# Patient Record
Sex: Female | Born: 1950
Health system: Southern US, Community
[De-identification: ages and names within clinical notes are randomized; demographics above are authoritative.]

## PROBLEM LIST (undated history)

## (undated) DIAGNOSIS — I2699 Other pulmonary embolism without acute cor pulmonale: Secondary | ICD-10-CM

## (undated) DIAGNOSIS — D72819 Decreased white blood cell count, unspecified: Secondary | ICD-10-CM

## (undated) DIAGNOSIS — K219 Gastro-esophageal reflux disease without esophagitis: Secondary | ICD-10-CM

## (undated) DIAGNOSIS — C801 Malignant (primary) neoplasm, unspecified: Secondary | ICD-10-CM

## (undated) DIAGNOSIS — G8929 Other chronic pain: Secondary | ICD-10-CM

## (undated) DIAGNOSIS — I10 Essential (primary) hypertension: Secondary | ICD-10-CM

## (undated) DIAGNOSIS — E46 Unspecified protein-calorie malnutrition: Secondary | ICD-10-CM

## (undated) DIAGNOSIS — K760 Fatty (change of) liver, not elsewhere classified: Secondary | ICD-10-CM

## (undated) DIAGNOSIS — K648 Other hemorrhoids: Secondary | ICD-10-CM

## (undated) DIAGNOSIS — I82409 Acute embolism and thrombosis of unspecified deep veins of unspecified lower extremity: Secondary | ICD-10-CM

## (undated) DIAGNOSIS — E039 Hypothyroidism, unspecified: Secondary | ICD-10-CM

## (undated) DIAGNOSIS — K56609 Unspecified intestinal obstruction, unspecified as to partial versus complete obstruction: Secondary | ICD-10-CM

## (undated) DIAGNOSIS — N390 Urinary tract infection, site not specified: Secondary | ICD-10-CM

## (undated) DIAGNOSIS — K626 Ulcer of anus and rectum: Secondary | ICD-10-CM

## (undated) DIAGNOSIS — E785 Hyperlipidemia, unspecified: Secondary | ICD-10-CM

## (undated) DIAGNOSIS — M549 Dorsalgia, unspecified: Secondary | ICD-10-CM

## (undated) DIAGNOSIS — D689 Coagulation defect, unspecified: Secondary | ICD-10-CM

## (undated) DIAGNOSIS — M109 Gout, unspecified: Secondary | ICD-10-CM

## (undated) HISTORY — PX: ABDOMINAL SURGERY: SHX537

## (undated) HISTORY — DX: Ulcer of anus and rectum: K62.6

## (undated) HISTORY — DX: Other chronic pain: G89.29

## (undated) HISTORY — DX: Dorsalgia, unspecified: M54.9

## (undated) HISTORY — PX: BLADDER REMOVAL: SHX567

## (undated) HISTORY — PX: BACK SURGERY: SHX140

## (undated) HISTORY — DX: Essential (primary) hypertension: I10

## (undated) HISTORY — PX: HERNIA REPAIR: SHX51

## (undated) HISTORY — DX: Acute embolism and thrombosis of unspecified deep veins of unspecified lower extremity: I82.409

## (undated) HISTORY — PX: CYSTECTOMY: SUR359

## (undated) HISTORY — DX: Gout, unspecified: M10.9

## (undated) HISTORY — DX: Fatty (change of) liver, not elsewhere classified: K76.0

## (undated) HISTORY — PX: PORTACATH PLACEMENT: SHX2246

## (undated) HISTORY — DX: Other hemorrhoids: K64.8

## (undated) HISTORY — DX: Coagulation defect, unspecified: D68.9

## (undated) HISTORY — DX: Other pulmonary embolism without acute cor pulmonale: I26.99

## (undated) HISTORY — PX: ILEO CONDUIT: SHX1778

## (undated) HISTORY — PX: CHOLECYSTECTOMY: SHX55

## (undated) HISTORY — DX: Hyperlipidemia, unspecified: E78.5

---

## 2001-07-21 ENCOUNTER — Ambulatory Visit (HOSPITAL_COMMUNITY): Admission: RE | Admit: 2001-07-21 | Discharge: 2001-07-21 | Payer: Self-pay | Admitting: Internal Medicine

## 2001-07-21 ENCOUNTER — Encounter: Payer: Self-pay | Admitting: Internal Medicine

## 2001-09-01 ENCOUNTER — Other Ambulatory Visit: Admission: RE | Admit: 2001-09-01 | Discharge: 2001-09-01 | Payer: Self-pay | Admitting: Internal Medicine

## 2001-12-09 ENCOUNTER — Emergency Department (HOSPITAL_COMMUNITY): Admission: EM | Admit: 2001-12-09 | Discharge: 2001-12-09 | Payer: Self-pay | Admitting: Internal Medicine

## 2001-12-09 ENCOUNTER — Encounter: Payer: Self-pay | Admitting: Internal Medicine

## 2002-09-08 ENCOUNTER — Ambulatory Visit (HOSPITAL_COMMUNITY): Admission: RE | Admit: 2002-09-08 | Discharge: 2002-09-08 | Payer: Self-pay | Admitting: General Surgery

## 2002-10-15 ENCOUNTER — Ambulatory Visit (HOSPITAL_COMMUNITY): Admission: RE | Admit: 2002-10-15 | Discharge: 2002-10-15 | Payer: Self-pay | Admitting: Internal Medicine

## 2002-10-15 ENCOUNTER — Encounter: Payer: Self-pay | Admitting: Internal Medicine

## 2003-07-13 ENCOUNTER — Ambulatory Visit (HOSPITAL_COMMUNITY): Admission: RE | Admit: 2003-07-13 | Discharge: 2003-07-13 | Payer: Self-pay | Admitting: General Surgery

## 2003-08-30 ENCOUNTER — Ambulatory Visit (HOSPITAL_COMMUNITY): Admission: RE | Admit: 2003-08-30 | Discharge: 2003-08-30 | Payer: Self-pay | Admitting: General Surgery

## 2004-01-18 ENCOUNTER — Ambulatory Visit (HOSPITAL_COMMUNITY): Admission: RE | Admit: 2004-01-18 | Discharge: 2004-01-18 | Payer: Self-pay | Admitting: Family Medicine

## 2004-06-08 ENCOUNTER — Emergency Department (HOSPITAL_COMMUNITY): Admission: EM | Admit: 2004-06-08 | Discharge: 2004-06-08 | Payer: Self-pay | Admitting: Emergency Medicine

## 2004-06-15 ENCOUNTER — Emergency Department (HOSPITAL_COMMUNITY): Admission: EM | Admit: 2004-06-15 | Discharge: 2004-06-15 | Payer: Self-pay | Admitting: Emergency Medicine

## 2004-07-30 ENCOUNTER — Ambulatory Visit: Payer: Self-pay | Admitting: *Deleted

## 2004-07-30 ENCOUNTER — Ambulatory Visit (HOSPITAL_COMMUNITY): Admission: RE | Admit: 2004-07-30 | Discharge: 2004-07-30 | Payer: Self-pay | Admitting: General Surgery

## 2004-08-02 ENCOUNTER — Encounter (HOSPITAL_COMMUNITY): Admission: RE | Admit: 2004-08-02 | Discharge: 2004-08-03 | Payer: Self-pay | Admitting: Internal Medicine

## 2004-12-11 ENCOUNTER — Emergency Department (HOSPITAL_COMMUNITY): Admission: EM | Admit: 2004-12-11 | Discharge: 2004-12-11 | Payer: Self-pay | Admitting: Emergency Medicine

## 2004-12-20 ENCOUNTER — Ambulatory Visit (HOSPITAL_COMMUNITY): Admission: RE | Admit: 2004-12-20 | Discharge: 2004-12-20 | Payer: Self-pay | Admitting: General Surgery

## 2005-11-24 ENCOUNTER — Emergency Department (HOSPITAL_COMMUNITY): Admission: EM | Admit: 2005-11-24 | Discharge: 2005-11-24 | Payer: Self-pay | Admitting: Emergency Medicine

## 2005-11-28 ENCOUNTER — Ambulatory Visit (HOSPITAL_COMMUNITY): Admission: RE | Admit: 2005-11-28 | Discharge: 2005-11-28 | Payer: Self-pay | Admitting: General Surgery

## 2005-12-03 ENCOUNTER — Encounter (INDEPENDENT_AMBULATORY_CARE_PROVIDER_SITE_OTHER): Payer: Self-pay | Admitting: Specialist

## 2005-12-03 ENCOUNTER — Inpatient Hospital Stay (HOSPITAL_COMMUNITY): Admission: RE | Admit: 2005-12-03 | Discharge: 2005-12-13 | Payer: Self-pay | Admitting: General Surgery

## 2006-01-29 ENCOUNTER — Encounter (INDEPENDENT_AMBULATORY_CARE_PROVIDER_SITE_OTHER): Payer: Self-pay | Admitting: Internal Medicine

## 2006-01-29 LAB — CONVERTED CEMR LAB: Blood Glucose, Fasting: 111 mg/dL

## 2006-02-12 ENCOUNTER — Ambulatory Visit: Payer: Self-pay | Admitting: Internal Medicine

## 2006-02-14 ENCOUNTER — Encounter (INDEPENDENT_AMBULATORY_CARE_PROVIDER_SITE_OTHER): Payer: Self-pay | Admitting: Internal Medicine

## 2006-02-14 LAB — CONVERTED CEMR LAB: RBC count: 4.1 10*6/uL

## 2006-02-18 ENCOUNTER — Ambulatory Visit (HOSPITAL_COMMUNITY): Admission: RE | Admit: 2006-02-18 | Discharge: 2006-02-18 | Payer: Self-pay | Admitting: Internal Medicine

## 2006-02-26 ENCOUNTER — Ambulatory Visit: Payer: Self-pay | Admitting: Gastroenterology

## 2006-04-09 ENCOUNTER — Emergency Department (HOSPITAL_COMMUNITY): Admission: EM | Admit: 2006-04-09 | Discharge: 2006-04-09 | Payer: Self-pay | Admitting: Emergency Medicine

## 2006-05-13 ENCOUNTER — Emergency Department (HOSPITAL_COMMUNITY): Admission: EM | Admit: 2006-05-13 | Discharge: 2006-05-13 | Payer: Self-pay | Admitting: Emergency Medicine

## 2006-07-16 ENCOUNTER — Encounter (INDEPENDENT_AMBULATORY_CARE_PROVIDER_SITE_OTHER): Payer: Self-pay | Admitting: Internal Medicine

## 2006-07-16 ENCOUNTER — Ambulatory Visit: Payer: Self-pay | Admitting: Internal Medicine

## 2006-07-16 ENCOUNTER — Other Ambulatory Visit: Admission: RE | Admit: 2006-07-16 | Discharge: 2006-07-16 | Payer: Self-pay | Admitting: Internal Medicine

## 2006-07-16 LAB — CONVERTED CEMR LAB
Pap Smear: NORMAL
RBC / HPF: NONE SEEN (ref <3)
Urinalysis: NEGATIVE

## 2006-07-17 ENCOUNTER — Encounter (INDEPENDENT_AMBULATORY_CARE_PROVIDER_SITE_OTHER): Payer: Self-pay | Admitting: Internal Medicine

## 2006-07-17 LAB — CONVERTED CEMR LAB
Candida species: NEGATIVE
Trichomonal Vaginitis: NEGATIVE

## 2006-07-21 ENCOUNTER — Encounter: Payer: Self-pay | Admitting: Internal Medicine

## 2006-07-21 DIAGNOSIS — M545 Low back pain, unspecified: Secondary | ICD-10-CM | POA: Insufficient documentation

## 2006-07-21 DIAGNOSIS — K219 Gastro-esophageal reflux disease without esophagitis: Secondary | ICD-10-CM | POA: Insufficient documentation

## 2006-07-21 DIAGNOSIS — M129 Arthropathy, unspecified: Secondary | ICD-10-CM | POA: Insufficient documentation

## 2006-07-21 DIAGNOSIS — Z8639 Personal history of other endocrine, nutritional and metabolic disease: Secondary | ICD-10-CM

## 2006-07-21 DIAGNOSIS — N318 Other neuromuscular dysfunction of bladder: Secondary | ICD-10-CM | POA: Insufficient documentation

## 2006-07-21 DIAGNOSIS — L03319 Cellulitis of trunk, unspecified: Secondary | ICD-10-CM

## 2006-07-21 DIAGNOSIS — I10 Essential (primary) hypertension: Secondary | ICD-10-CM | POA: Insufficient documentation

## 2006-07-21 DIAGNOSIS — E039 Hypothyroidism, unspecified: Secondary | ICD-10-CM | POA: Insufficient documentation

## 2006-07-21 DIAGNOSIS — K259 Gastric ulcer, unspecified as acute or chronic, without hemorrhage or perforation: Secondary | ICD-10-CM | POA: Insufficient documentation

## 2006-07-21 DIAGNOSIS — Z862 Personal history of diseases of the blood and blood-forming organs and certain disorders involving the immune mechanism: Secondary | ICD-10-CM | POA: Insufficient documentation

## 2006-07-21 DIAGNOSIS — L02219 Cutaneous abscess of trunk, unspecified: Secondary | ICD-10-CM | POA: Insufficient documentation

## 2006-07-23 ENCOUNTER — Encounter (INDEPENDENT_AMBULATORY_CARE_PROVIDER_SITE_OTHER): Payer: Self-pay | Admitting: Internal Medicine

## 2006-07-23 LAB — CONVERTED CEMR LAB
CO2: 24 meq/L (ref 19–32)
Chloride: 105 meq/L (ref 96–112)
Creatinine, Ser: 0.68 mg/dL (ref 0.40–1.20)
Free T4: 1.54 ng/dL (ref 0.89–1.80)
Glucose, Bld: 107 mg/dL — ABNORMAL HIGH (ref 70–99)
Triglycerides: 98 mg/dL (ref <150)

## 2006-08-22 ENCOUNTER — Ambulatory Visit: Payer: Self-pay | Admitting: Internal Medicine

## 2006-08-22 DIAGNOSIS — F17200 Nicotine dependence, unspecified, uncomplicated: Secondary | ICD-10-CM | POA: Insufficient documentation

## 2006-09-17 ENCOUNTER — Emergency Department (HOSPITAL_COMMUNITY): Admission: EM | Admit: 2006-09-17 | Discharge: 2006-09-17 | Payer: Self-pay | Admitting: Emergency Medicine

## 2006-10-16 ENCOUNTER — Ambulatory Visit: Payer: Self-pay | Admitting: Internal Medicine

## 2006-10-16 DIAGNOSIS — M79609 Pain in unspecified limb: Secondary | ICD-10-CM | POA: Insufficient documentation

## 2006-10-24 ENCOUNTER — Encounter (INDEPENDENT_AMBULATORY_CARE_PROVIDER_SITE_OTHER): Payer: Self-pay | Admitting: Internal Medicine

## 2006-12-08 ENCOUNTER — Telehealth (INDEPENDENT_AMBULATORY_CARE_PROVIDER_SITE_OTHER): Payer: Self-pay | Admitting: *Deleted

## 2006-12-09 ENCOUNTER — Ambulatory Visit: Payer: Self-pay | Admitting: Internal Medicine

## 2006-12-09 DIAGNOSIS — R0602 Shortness of breath: Secondary | ICD-10-CM | POA: Insufficient documentation

## 2006-12-10 ENCOUNTER — Encounter (INDEPENDENT_AMBULATORY_CARE_PROVIDER_SITE_OTHER): Payer: Self-pay | Admitting: Internal Medicine

## 2006-12-25 ENCOUNTER — Ambulatory Visit: Payer: Self-pay | Admitting: Internal Medicine

## 2006-12-25 LAB — CONVERTED CEMR LAB
Bilirubin Urine: NEGATIVE
Nitrite: NEGATIVE
Protein, U semiquant: 100
Specific Gravity, Urine: >=1.03
pH: 5.5

## 2006-12-30 ENCOUNTER — Telehealth (INDEPENDENT_AMBULATORY_CARE_PROVIDER_SITE_OTHER): Payer: Self-pay | Admitting: Internal Medicine

## 2006-12-31 ENCOUNTER — Ambulatory Visit: Payer: Self-pay | Admitting: Internal Medicine

## 2006-12-31 LAB — CONVERTED CEMR LAB
Bilirubin Urine: NEGATIVE
Glucose, Urine, Semiquant: NEGATIVE
Ketones, urine, test strip: NEGATIVE
Nitrite: POSITIVE
Protein, U semiquant: 100
Specific Gravity, Urine: >=1.03

## 2007-01-01 ENCOUNTER — Encounter (INDEPENDENT_AMBULATORY_CARE_PROVIDER_SITE_OTHER): Payer: Self-pay | Admitting: Internal Medicine

## 2007-01-08 ENCOUNTER — Ambulatory Visit: Payer: Self-pay | Admitting: Internal Medicine

## 2007-01-08 DIAGNOSIS — R319 Hematuria, unspecified: Secondary | ICD-10-CM | POA: Insufficient documentation

## 2007-01-08 DIAGNOSIS — R351 Nocturia: Secondary | ICD-10-CM | POA: Insufficient documentation

## 2007-01-08 LAB — CONVERTED CEMR LAB
Bilirubin Urine: NEGATIVE
Glucose, Urine, Semiquant: NEGATIVE
Ketones, urine, test strip: NEGATIVE
Nitrite: NEGATIVE
Protein, U semiquant: NEGATIVE
Urobilinogen, UA: 0.2

## 2007-01-09 LAB — CONVERTED CEMR LAB

## 2007-01-14 ENCOUNTER — Ambulatory Visit: Payer: Self-pay | Admitting: Cardiology

## 2007-01-15 ENCOUNTER — Inpatient Hospital Stay (HOSPITAL_COMMUNITY): Admission: AD | Admit: 2007-01-15 | Discharge: 2007-01-16 | Payer: Self-pay | Admitting: Cardiology

## 2007-01-15 ENCOUNTER — Ambulatory Visit: Payer: Self-pay | Admitting: Cardiology

## 2007-01-22 ENCOUNTER — Ambulatory Visit: Payer: Self-pay | Admitting: Cardiology

## 2007-01-23 ENCOUNTER — Ambulatory Visit: Payer: Self-pay | Admitting: Internal Medicine

## 2007-01-23 DIAGNOSIS — E785 Hyperlipidemia, unspecified: Secondary | ICD-10-CM | POA: Insufficient documentation

## 2007-01-26 ENCOUNTER — Telehealth (INDEPENDENT_AMBULATORY_CARE_PROVIDER_SITE_OTHER): Payer: Self-pay | Admitting: *Deleted

## 2007-01-26 LAB — CONVERTED CEMR LAB: Free T4: 1.36 ng/dL (ref 0.89–1.80)

## 2007-03-20 ENCOUNTER — Ambulatory Visit: Payer: Self-pay | Admitting: Internal Medicine

## 2007-03-25 ENCOUNTER — Telehealth (INDEPENDENT_AMBULATORY_CARE_PROVIDER_SITE_OTHER): Payer: Self-pay | Admitting: *Deleted

## 2007-05-22 ENCOUNTER — Ambulatory Visit: Payer: Self-pay | Admitting: Internal Medicine

## 2007-05-22 DIAGNOSIS — R3589 Other polyuria: Secondary | ICD-10-CM | POA: Insufficient documentation

## 2007-05-22 DIAGNOSIS — R358 Other polyuria: Secondary | ICD-10-CM

## 2007-05-22 LAB — CONVERTED CEMR LAB
Ketones, urine, test strip: NEGATIVE
Protein, U semiquant: NEGATIVE

## 2007-05-25 ENCOUNTER — Telehealth (INDEPENDENT_AMBULATORY_CARE_PROVIDER_SITE_OTHER): Payer: Self-pay | Admitting: *Deleted

## 2007-06-02 ENCOUNTER — Ambulatory Visit (HOSPITAL_COMMUNITY): Admission: RE | Admit: 2007-06-02 | Discharge: 2007-06-02 | Payer: Self-pay | Admitting: Internal Medicine

## 2007-06-09 ENCOUNTER — Telehealth (INDEPENDENT_AMBULATORY_CARE_PROVIDER_SITE_OTHER): Payer: Self-pay | Admitting: *Deleted

## 2007-07-01 ENCOUNTER — Ambulatory Visit: Payer: Self-pay | Admitting: Internal Medicine

## 2007-07-01 DIAGNOSIS — J209 Acute bronchitis, unspecified: Secondary | ICD-10-CM | POA: Insufficient documentation

## 2007-07-15 ENCOUNTER — Ambulatory Visit: Payer: Self-pay | Admitting: Internal Medicine

## 2007-07-15 ENCOUNTER — Telehealth (INDEPENDENT_AMBULATORY_CARE_PROVIDER_SITE_OTHER): Payer: Self-pay | Admitting: *Deleted

## 2007-07-15 DIAGNOSIS — K838 Other specified diseases of biliary tract: Secondary | ICD-10-CM | POA: Insufficient documentation

## 2007-07-16 ENCOUNTER — Encounter (INDEPENDENT_AMBULATORY_CARE_PROVIDER_SITE_OTHER): Payer: Self-pay | Admitting: Internal Medicine

## 2007-07-16 LAB — CONVERTED CEMR LAB
AST: 7 units/L (ref 0–37)
Cholesterol: 242 mg/dL — ABNORMAL HIGH (ref 0–200)
Glucose, Bld: 128 mg/dL — ABNORMAL HIGH (ref 70–99)
HDL: 46 mg/dL (ref >39)
Total CHOL/HDL Ratio: 5.3
VLDL: 21 mg/dL (ref 0–40)

## 2007-07-21 ENCOUNTER — Encounter (INDEPENDENT_AMBULATORY_CARE_PROVIDER_SITE_OTHER): Payer: Self-pay | Admitting: Internal Medicine

## 2007-07-23 ENCOUNTER — Encounter (INDEPENDENT_AMBULATORY_CARE_PROVIDER_SITE_OTHER): Payer: Self-pay | Admitting: Internal Medicine

## 2007-07-25 ENCOUNTER — Telehealth (INDEPENDENT_AMBULATORY_CARE_PROVIDER_SITE_OTHER): Payer: Self-pay | Admitting: Internal Medicine

## 2007-08-17 ENCOUNTER — Ambulatory Visit: Payer: Self-pay | Admitting: Internal Medicine

## 2007-08-18 ENCOUNTER — Encounter (INDEPENDENT_AMBULATORY_CARE_PROVIDER_SITE_OTHER): Payer: Self-pay | Admitting: Internal Medicine

## 2007-08-20 ENCOUNTER — Telehealth (INDEPENDENT_AMBULATORY_CARE_PROVIDER_SITE_OTHER): Payer: Self-pay | Admitting: Internal Medicine

## 2007-08-21 ENCOUNTER — Ambulatory Visit: Payer: Self-pay | Admitting: Internal Medicine

## 2007-08-21 DIAGNOSIS — R5383 Other fatigue: Secondary | ICD-10-CM | POA: Insufficient documentation

## 2007-08-21 DIAGNOSIS — R5381 Other malaise: Secondary | ICD-10-CM | POA: Insufficient documentation

## 2007-08-21 DIAGNOSIS — F438 Other reactions to severe stress: Secondary | ICD-10-CM

## 2007-08-21 DIAGNOSIS — F4389 Other reactions to severe stress: Secondary | ICD-10-CM | POA: Insufficient documentation

## 2007-08-24 ENCOUNTER — Encounter (INDEPENDENT_AMBULATORY_CARE_PROVIDER_SITE_OTHER): Payer: Self-pay | Admitting: Internal Medicine

## 2007-08-24 ENCOUNTER — Ambulatory Visit (HOSPITAL_BASED_OUTPATIENT_CLINIC_OR_DEPARTMENT_OTHER): Admission: RE | Admit: 2007-08-24 | Discharge: 2007-08-24 | Payer: Self-pay | Admitting: Urology

## 2007-08-24 ENCOUNTER — Encounter (INDEPENDENT_AMBULATORY_CARE_PROVIDER_SITE_OTHER): Payer: Self-pay | Admitting: Urology

## 2007-09-02 ENCOUNTER — Encounter (INDEPENDENT_AMBULATORY_CARE_PROVIDER_SITE_OTHER): Payer: Self-pay | Admitting: Internal Medicine

## 2007-10-02 ENCOUNTER — Encounter (INDEPENDENT_AMBULATORY_CARE_PROVIDER_SITE_OTHER): Payer: Self-pay | Admitting: Internal Medicine

## 2007-11-12 ENCOUNTER — Encounter (INDEPENDENT_AMBULATORY_CARE_PROVIDER_SITE_OTHER): Payer: Self-pay | Admitting: Internal Medicine

## 2007-11-17 ENCOUNTER — Emergency Department (HOSPITAL_COMMUNITY): Admission: EM | Admit: 2007-11-17 | Discharge: 2007-11-17 | Payer: Self-pay | Admitting: Emergency Medicine

## 2007-11-17 ENCOUNTER — Telehealth (INDEPENDENT_AMBULATORY_CARE_PROVIDER_SITE_OTHER): Payer: Self-pay | Admitting: Internal Medicine

## 2007-11-25 ENCOUNTER — Ambulatory Visit (HOSPITAL_BASED_OUTPATIENT_CLINIC_OR_DEPARTMENT_OTHER): Admission: RE | Admit: 2007-11-25 | Discharge: 2007-11-25 | Payer: Self-pay | Admitting: Urology

## 2007-11-25 ENCOUNTER — Encounter (INDEPENDENT_AMBULATORY_CARE_PROVIDER_SITE_OTHER): Payer: Self-pay | Admitting: Urology

## 2007-12-04 ENCOUNTER — Encounter (INDEPENDENT_AMBULATORY_CARE_PROVIDER_SITE_OTHER): Payer: Self-pay | Admitting: Internal Medicine

## 2007-12-14 ENCOUNTER — Encounter (INDEPENDENT_AMBULATORY_CARE_PROVIDER_SITE_OTHER): Payer: Self-pay | Admitting: Urology

## 2007-12-14 ENCOUNTER — Ambulatory Visit (HOSPITAL_BASED_OUTPATIENT_CLINIC_OR_DEPARTMENT_OTHER): Admission: RE | Admit: 2007-12-14 | Discharge: 2007-12-14 | Payer: Self-pay | Admitting: Urology

## 2007-12-24 ENCOUNTER — Encounter (INDEPENDENT_AMBULATORY_CARE_PROVIDER_SITE_OTHER): Payer: Self-pay | Admitting: Internal Medicine

## 2008-04-20 ENCOUNTER — Encounter (INDEPENDENT_AMBULATORY_CARE_PROVIDER_SITE_OTHER): Payer: Self-pay | Admitting: Urology

## 2008-04-20 ENCOUNTER — Ambulatory Visit (HOSPITAL_BASED_OUTPATIENT_CLINIC_OR_DEPARTMENT_OTHER): Admission: RE | Admit: 2008-04-20 | Discharge: 2008-04-20 | Payer: Self-pay | Admitting: Urology

## 2008-05-09 ENCOUNTER — Emergency Department (HOSPITAL_COMMUNITY): Admission: EM | Admit: 2008-05-09 | Discharge: 2008-05-10 | Payer: Self-pay | Admitting: Emergency Medicine

## 2008-09-19 ENCOUNTER — Inpatient Hospital Stay: Admission: RE | Admit: 2008-09-19 | Discharge: 2008-10-05 | Payer: Self-pay | Admitting: Internal Medicine

## 2008-10-16 ENCOUNTER — Emergency Department (HOSPITAL_COMMUNITY): Admission: EM | Admit: 2008-10-16 | Discharge: 2008-10-16 | Payer: Self-pay | Admitting: Emergency Medicine

## 2008-11-17 ENCOUNTER — Emergency Department (HOSPITAL_COMMUNITY): Admission: EM | Admit: 2008-11-17 | Discharge: 2008-11-17 | Payer: Self-pay | Admitting: Emergency Medicine

## 2008-11-21 ENCOUNTER — Emergency Department (HOSPITAL_COMMUNITY): Admission: EM | Admit: 2008-11-21 | Discharge: 2008-11-21 | Payer: Self-pay | Admitting: Emergency Medicine

## 2009-05-07 ENCOUNTER — Emergency Department (HOSPITAL_COMMUNITY): Admission: EM | Admit: 2009-05-07 | Discharge: 2009-05-07 | Payer: Self-pay | Admitting: Emergency Medicine

## 2009-06-03 ENCOUNTER — Emergency Department (HOSPITAL_COMMUNITY): Admission: EM | Admit: 2009-06-03 | Discharge: 2009-06-03 | Payer: Self-pay | Admitting: Emergency Medicine

## 2009-08-16 ENCOUNTER — Emergency Department (HOSPITAL_COMMUNITY): Admission: EM | Admit: 2009-08-16 | Discharge: 2009-08-16 | Payer: Self-pay | Admitting: Emergency Medicine

## 2009-09-28 ENCOUNTER — Emergency Department (HOSPITAL_COMMUNITY): Admission: EM | Admit: 2009-09-28 | Discharge: 2009-09-28 | Payer: Self-pay | Admitting: Emergency Medicine

## 2010-04-03 ENCOUNTER — Inpatient Hospital Stay (HOSPITAL_COMMUNITY): Admission: EM | Admit: 2010-04-03 | Discharge: 2010-04-06 | Payer: Self-pay | Admitting: Emergency Medicine

## 2010-05-06 ENCOUNTER — Inpatient Hospital Stay (HOSPITAL_COMMUNITY): Admission: EM | Admit: 2010-05-06 | Discharge: 2010-05-10 | Payer: Self-pay | Admitting: Emergency Medicine

## 2010-05-08 ENCOUNTER — Ambulatory Visit: Payer: Self-pay | Admitting: Internal Medicine

## 2010-06-03 ENCOUNTER — Inpatient Hospital Stay (HOSPITAL_COMMUNITY)
Admission: EM | Admit: 2010-06-03 | Discharge: 2010-06-12 | Payer: Self-pay | Source: Home / Self Care | Attending: Family Medicine | Admitting: Family Medicine

## 2010-07-21 ENCOUNTER — Encounter: Payer: Self-pay | Admitting: Internal Medicine

## 2010-07-21 ENCOUNTER — Encounter: Payer: Self-pay | Admitting: General Surgery

## 2010-07-22 ENCOUNTER — Encounter: Payer: Self-pay | Admitting: Internal Medicine

## 2010-09-10 LAB — COMPREHENSIVE METABOLIC PANEL
ALT: 14 U/L (ref 0–35)
ALT: 21 U/L (ref 0–35)
AST: 16 U/L (ref 0–37)
Albumin: 2.7 g/dL — ABNORMAL LOW (ref 3.5–5.2)
Alkaline Phosphatase: 127 U/L — ABNORMAL HIGH (ref 39–117)
CO2: 21 mEq/L (ref 19–32)
Chloride: 108 mEq/L (ref 96–112)
Creatinine, Ser: 1.28 mg/dL — ABNORMAL HIGH (ref 0.4–1.2)
GFR calc Af Amer: 52 mL/min — ABNORMAL LOW (ref >60)
GFR calc non Af Amer: 35 mL/min — ABNORMAL LOW (ref >60)
Glucose, Bld: 104 mg/dL — ABNORMAL HIGH (ref 70–99)
Potassium: 3.2 mEq/L — ABNORMAL LOW (ref 3.5–5.1)
Potassium: 3.6 mEq/L (ref 3.5–5.1)
Sodium: 134 mEq/L — ABNORMAL LOW (ref 135–145)
Sodium: 141 mEq/L (ref 135–145)
Total Bilirubin: 0.4 mg/dL (ref 0.3–1.2)
Total Bilirubin: 0.5 mg/dL (ref 0.3–1.2)

## 2010-09-10 LAB — CBC
HCT: 21 % — ABNORMAL LOW (ref 36.0–46.0)
HCT: 21.6 % — ABNORMAL LOW (ref 36.0–46.0)
HCT: 23.4 % — ABNORMAL LOW (ref 36.0–46.0)
HCT: 24.8 % — ABNORMAL LOW (ref 36.0–46.0)
HCT: 26.9 % — ABNORMAL LOW (ref 36.0–46.0)
Hemoglobin: 7.3 g/dL — ABNORMAL LOW (ref 12.0–15.0)
Hemoglobin: 7.6 g/dL — ABNORMAL LOW (ref 12.0–15.0)
Hemoglobin: 8 g/dL — ABNORMAL LOW (ref 12.0–15.0)
Hemoglobin: 8.4 g/dL — ABNORMAL LOW (ref 12.0–15.0)
Hemoglobin: 8.8 g/dL — ABNORMAL LOW (ref 12.0–15.0)
Hemoglobin: 9.1 g/dL — ABNORMAL LOW (ref 12.0–15.0)
Hemoglobin: 9.6 g/dL — ABNORMAL LOW (ref 12.0–15.0)
MCH: 28.6 pg (ref 26.0–34.0)
MCH: 28.8 pg (ref 26.0–34.0)
MCH: 29.2 pg (ref 26.0–34.0)
MCH: 29.5 pg (ref 26.0–34.0)
MCH: 29.5 pg (ref 26.0–34.0)
MCHC: 34.8 g/dL (ref 30.0–36.0)
MCHC: 35.7 g/dL (ref 30.0–36.0)
MCHC: 35.9 g/dL (ref 30.0–36.0)
MCHC: 35.9 g/dL (ref 30.0–36.0)
MCV: 81 fL (ref 78.0–100.0)
MCV: 81.8 fL (ref 78.0–100.0)
MCV: 82.2 fL (ref 78.0–100.0)
Platelets: 129 10*3/uL — ABNORMAL LOW (ref 150–400)
Platelets: 57 10*3/uL — ABNORMAL LOW (ref 150–400)
Platelets: 81 10*3/uL — ABNORMAL LOW (ref 150–400)
RBC: 2.63 MIL/uL — ABNORMAL LOW (ref 3.87–5.11)
RBC: 2.78 MIL/uL — ABNORMAL LOW (ref 3.87–5.11)
RBC: 2.98 MIL/uL — ABNORMAL LOW (ref 3.87–5.11)
RBC: 3.06 MIL/uL — ABNORMAL LOW (ref 3.87–5.11)
RDW: 15.8 % — ABNORMAL HIGH (ref 11.5–15.5)
RDW: 16 % — ABNORMAL HIGH (ref 11.5–15.5)
WBC: 31.8 10*3/uL — ABNORMAL HIGH (ref 4.0–10.5)
WBC: 39.1 10*3/uL — ABNORMAL HIGH (ref 4.0–10.5)

## 2010-09-10 LAB — DIFFERENTIAL
Band Neutrophils: 19 % — ABNORMAL HIGH (ref 0–10)
Band Neutrophils: 4 % (ref 0–10)
Basophils Absolute: 0 10*3/uL (ref 0.0–0.1)
Basophils Absolute: 0 10*3/uL (ref 0.0–0.1)
Basophils Absolute: 0 10*3/uL (ref 0.0–0.1)
Basophils Absolute: 0 10*3/uL (ref 0.0–0.1)
Basophils Relative: 0 % (ref 0–1)
Basophils Relative: 0 % (ref 0–1)
Basophils Relative: 0 % (ref 0–1)
Basophils Relative: 0 % (ref 0–1)
Basophils Relative: 0 % (ref 0–1)
Blasts: 0 %
Blasts: 0 %
Eosinophils Absolute: 0 10*3/uL (ref 0.0–0.7)
Eosinophils Absolute: 0 10*3/uL (ref 0.0–0.7)
Eosinophils Absolute: 0.4 10*3/uL (ref 0.0–0.7)
Eosinophils Relative: 0 % (ref 0–5)
Eosinophils Relative: 0 % (ref 0–5)
Eosinophils Relative: 1 % (ref 0–5)
Eosinophils Relative: 1 % (ref 0–5)
Lymphocytes Relative: 11 % — ABNORMAL LOW (ref 12–46)
Lymphocytes Relative: 3 % — ABNORMAL LOW (ref 12–46)
Lymphocytes Relative: 5 % — ABNORMAL LOW (ref 12–46)
Lymphocytes Relative: 8 % — ABNORMAL LOW (ref 12–46)
Lymphs Abs: 0.6 10*3/uL — ABNORMAL LOW (ref 0.7–4.0)
Lymphs Abs: 1.6 10*3/uL (ref 0.7–4.0)
Lymphs Abs: 2.5 10*3/uL (ref 0.7–4.0)
Lymphs Abs: 4.3 10*3/uL — ABNORMAL HIGH (ref 0.7–4.0)
Metamyelocytes Relative: 2 %
Metamyelocytes Relative: 8 %
Monocytes Absolute: 1.5 10*3/uL — ABNORMAL HIGH (ref 0.1–1.0)
Monocytes Absolute: 2.7 10*3/uL — ABNORMAL HIGH (ref 0.1–1.0)
Monocytes Relative: 7 % (ref 3–12)
Monocytes Relative: 9 % (ref 3–12)
Myelocytes: 0 %
Myelocytes: 0 %
Myelocytes: 2 %
Myelocytes: 2 %
Neutro Abs: 28.9 10*3/uL — ABNORMAL HIGH (ref 1.7–7.7)
Neutro Abs: 32.1 10*3/uL — ABNORMAL HIGH (ref 1.7–7.7)
Neutrophils Relative %: 65 % (ref 43–77)
Neutrophils Relative %: 76 % (ref 43–77)
Neutrophils Relative %: 87 % — ABNORMAL HIGH (ref 43–77)
Promyelocytes Absolute: 0 %
Promyelocytes Absolute: 0 %
Smear Review: DECREASED
nRBC: 0 /100 WBC
nRBC: 0 /100 WBC

## 2010-09-10 LAB — BASIC METABOLIC PANEL
BUN: 13 mg/dL (ref 6–23)
CO2: 24 mEq/L (ref 19–32)
CO2: 25 mEq/L (ref 19–32)
Calcium: 8.3 mg/dL — ABNORMAL LOW (ref 8.4–10.5)
Calcium: 8.3 mg/dL — ABNORMAL LOW (ref 8.4–10.5)
Calcium: 8.5 mg/dL (ref 8.4–10.5)
Chloride: 105 mEq/L (ref 96–112)
Creatinine, Ser: 1.28 mg/dL — ABNORMAL HIGH (ref 0.4–1.2)
GFR calc Af Amer: 43 mL/min — ABNORMAL LOW (ref >60)
GFR calc Af Amer: 52 mL/min — ABNORMAL LOW (ref >60)
GFR calc non Af Amer: 38 mL/min — ABNORMAL LOW (ref >60)
GFR calc non Af Amer: 43 mL/min — ABNORMAL LOW (ref >60)
Glucose, Bld: 83 mg/dL (ref 70–99)
Glucose, Bld: 88 mg/dL (ref 70–99)
Potassium: 4 mEq/L (ref 3.5–5.1)
Potassium: 4.1 mEq/L (ref 3.5–5.1)
Sodium: 136 mEq/L (ref 135–145)
Sodium: 140 mEq/L (ref 135–145)
Sodium: 142 mEq/L (ref 135–145)

## 2010-09-10 LAB — URINE CULTURE: Culture  Setup Time: 201112050050

## 2010-09-10 LAB — CULTURE, BLOOD (ROUTINE X 2)

## 2010-09-10 LAB — URINALYSIS, ROUTINE W REFLEX MICROSCOPIC
Ketones, ur: NEGATIVE mg/dL
Nitrite: POSITIVE — AB
Protein, ur: NEGATIVE mg/dL
Urobilinogen, UA: 0.2 mg/dL (ref 0.0–1.0)
pH: 6.5 (ref 5.0–8.0)

## 2010-09-10 LAB — GLUCOSE, CAPILLARY: Glucose-Capillary: 116 mg/dL — ABNORMAL HIGH (ref 70–99)

## 2010-09-10 LAB — LEUKOCYTE ALKALINE PHOSPHATASE: Leukocyte Alkaline  Phos Stain: 189 — ABNORMAL HIGH (ref 30–140)

## 2010-09-10 LAB — LACTIC ACID, PLASMA: Lactic Acid, Venous: 1 mmol/L (ref 0.5–2.2)

## 2010-09-10 LAB — URINE MICROSCOPIC-ADD ON

## 2010-09-10 LAB — MAGNESIUM: Magnesium: 1.6 mg/dL (ref 1.5–2.5)

## 2010-09-10 LAB — PROCALCITONIN: Procalcitonin: 0.63 ng/mL

## 2010-09-11 LAB — CBC
HCT: 25.1 % — ABNORMAL LOW (ref 36.0–46.0)
HCT: 25.5 % — ABNORMAL LOW (ref 36.0–46.0)
HCT: 29.2 % — ABNORMAL LOW (ref 36.0–46.0)
Hemoglobin: 8.7 g/dL — ABNORMAL LOW (ref 12.0–15.0)
Hemoglobin: 8.9 g/dL — ABNORMAL LOW (ref 12.0–15.0)
MCH: 29.7 pg (ref 26.0–34.0)
MCHC: 34.7 g/dL (ref 30.0–36.0)
MCHC: 35.4 g/dL (ref 30.0–36.0)
MCHC: 35.5 g/dL (ref 30.0–36.0)
MCV: 85 fL (ref 78.0–100.0)
MCV: 85.6 fL (ref 78.0–100.0)
Platelets: 74 10*3/uL — ABNORMAL LOW (ref 150–400)
Platelets: 84 10*3/uL — ABNORMAL LOW (ref 150–400)
RBC: 3 MIL/uL — ABNORMAL LOW (ref 3.87–5.11)
RDW: 14.8 % (ref 11.5–15.5)
RDW: 15 % (ref 11.5–15.5)
RDW: 15.1 % (ref 11.5–15.5)
WBC: 1.7 10*3/uL — ABNORMAL LOW (ref 4.0–10.5)
WBC: 2.2 10*3/uL — ABNORMAL LOW (ref 4.0–10.5)
WBC: 2.5 10*3/uL — ABNORMAL LOW (ref 4.0–10.5)

## 2010-09-11 LAB — DIFFERENTIAL
Basophils Absolute: 0 10*3/uL (ref 0.0–0.1)
Basophils Absolute: 0 10*3/uL (ref 0.0–0.1)
Basophils Absolute: 0 10*3/uL (ref 0.0–0.1)
Basophils Relative: 0 % (ref 0–1)
Basophils Relative: 1 % (ref 0–1)
Eosinophils Absolute: 0.1 10*3/uL (ref 0.0–0.7)
Eosinophils Relative: 2 % (ref 0–5)
Eosinophils Relative: 5 % (ref 0–5)
Eosinophils Relative: 8 % — ABNORMAL HIGH (ref 0–5)
Lymphocytes Relative: 18 % (ref 12–46)
Lymphocytes Relative: 19 % (ref 12–46)
Lymphs Abs: 0.4 10*3/uL — ABNORMAL LOW (ref 0.7–4.0)
Monocytes Absolute: 0.3 10*3/uL (ref 0.1–1.0)
Monocytes Absolute: 0.4 10*3/uL (ref 0.1–1.0)
Monocytes Absolute: 0.6 10*3/uL (ref 0.1–1.0)
Monocytes Relative: 12 % (ref 3–12)
Monocytes Relative: 16 % — ABNORMAL HIGH (ref 3–12)
Monocytes Relative: 35 % — ABNORMAL HIGH (ref 3–12)
Neutro Abs: 0.6 10*3/uL — ABNORMAL LOW (ref 1.7–7.7)
Neutro Abs: 1.3 10*3/uL — ABNORMAL LOW (ref 1.7–7.7)
Neutro Abs: 1.6 10*3/uL — ABNORMAL LOW (ref 1.7–7.7)
Neutrophils Relative %: 63 % (ref 43–77)

## 2010-09-11 LAB — BASIC METABOLIC PANEL
BUN: 4 mg/dL — ABNORMAL LOW (ref 6–23)
BUN: 4 mg/dL — ABNORMAL LOW (ref 6–23)
BUN: 6 mg/dL (ref 6–23)
BUN: 6 mg/dL (ref 6–23)
CO2: 24 mEq/L (ref 19–32)
CO2: 24 mEq/L (ref 19–32)
Calcium: 9.2 mg/dL (ref 8.4–10.5)
Calcium: 9.8 mg/dL (ref 8.4–10.5)
Chloride: 107 mEq/L (ref 96–112)
Chloride: 108 mEq/L (ref 96–112)
Creatinine, Ser: 1.01 mg/dL (ref 0.4–1.2)
Creatinine, Ser: 1.09 mg/dL (ref 0.4–1.2)
Creatinine, Ser: 1.09 mg/dL (ref 0.4–1.2)
GFR calc Af Amer: >60 mL/min (ref >60)
GFR calc non Af Amer: 54 mL/min — ABNORMAL LOW (ref >60)
GFR calc non Af Amer: 56 mL/min — ABNORMAL LOW (ref >60)
Glucose, Bld: 89 mg/dL (ref 70–99)
Glucose, Bld: 96 mg/dL (ref 70–99)
Potassium: 3.6 mEq/L (ref 3.5–5.1)
Potassium: 3.7 mEq/L (ref 3.5–5.1)
Potassium: 4 mEq/L (ref 3.5–5.1)
Sodium: 138 mEq/L (ref 135–145)

## 2010-09-11 LAB — URINE MICROSCOPIC-ADD ON

## 2010-09-11 LAB — URINE CULTURE
Colony Count: 50000
Colony Count: >=100000
Culture  Setup Time: 201111062052

## 2010-09-11 LAB — CULTURE, BLOOD (ROUTINE X 2): Culture: NO GROWTH

## 2010-09-11 LAB — MAGNESIUM
Magnesium: 1.3 mg/dL — ABNORMAL LOW (ref 1.5–2.5)
Magnesium: 1.4 mg/dL — ABNORMAL LOW (ref 1.5–2.5)

## 2010-09-11 LAB — CLOSTRIDIUM DIFFICILE EIA

## 2010-09-11 LAB — STOOL CULTURE

## 2010-09-11 LAB — HEPATIC FUNCTION PANEL
AST: 16 U/L (ref 0–37)
Bilirubin, Direct: 0.1 mg/dL (ref 0.0–0.3)
Indirect Bilirubin: 0.4 mg/dL (ref 0.3–0.9)
Total Protein: 6.9 g/dL (ref 6.0–8.3)

## 2010-09-11 LAB — URINALYSIS, ROUTINE W REFLEX MICROSCOPIC
Leukocytes, UA: NEGATIVE
Nitrite: NEGATIVE
Urobilinogen, UA: 0.2 mg/dL (ref 0.0–1.0)
pH: 6 (ref 5.0–8.0)

## 2010-09-11 LAB — OVA AND PARASITE EXAMINATION: Ova and parasites: NONE SEEN

## 2010-09-11 LAB — TSH: TSH: 5.076 u[IU]/mL — ABNORMAL HIGH (ref 0.350–4.500)

## 2010-09-12 LAB — BASIC METABOLIC PANEL
BUN: 15 mg/dL (ref 6–23)
Chloride: 104 mEq/L (ref 96–112)
Creatinine, Ser: 1.13 mg/dL (ref 0.4–1.2)
Glucose, Bld: 201 mg/dL — ABNORMAL HIGH (ref 70–99)
Potassium: 3.6 mEq/L (ref 3.5–5.1)

## 2010-09-12 LAB — CLOSTRIDIUM DIFFICILE EIA

## 2010-09-12 LAB — URINE CULTURE: Special Requests: NEGATIVE

## 2010-09-12 LAB — DIFFERENTIAL
Basophils Absolute: 0 10*3/uL (ref 0.0–0.1)
Basophils Relative: 0 % (ref 0–1)
Eosinophils Absolute: 0.2 10*3/uL (ref 0.0–0.7)
Eosinophils Relative: 2 % (ref 0–5)
Monocytes Absolute: 0.8 10*3/uL (ref 0.1–1.0)

## 2010-09-12 LAB — IRON AND TIBC
Saturation Ratios: 43 % (ref 20–55)
UIBC: 115 ug/dL

## 2010-09-12 LAB — STOOL CULTURE

## 2010-09-12 LAB — URINALYSIS, ROUTINE W REFLEX MICROSCOPIC
Bilirubin Urine: NEGATIVE
Nitrite: NEGATIVE
Specific Gravity, Urine: 1.01 (ref 1.005–1.030)
Urobilinogen, UA: 0.2 mg/dL (ref 0.0–1.0)

## 2010-09-12 LAB — RETICULOCYTES
RBC.: 3.82 MIL/uL — ABNORMAL LOW (ref 3.87–5.11)
Retic Ct Pct: 1.6 % (ref 0.4–3.1)

## 2010-09-12 LAB — CBC
HCT: 38 % (ref 36.0–46.0)
MCH: 30 pg (ref 26.0–34.0)
MCHC: 34.2 g/dL (ref 30.0–36.0)
MCV: 87.8 fL (ref 78.0–100.0)
Platelets: 263 10*3/uL (ref 150–400)
RDW: 15.5 % (ref 11.5–15.5)

## 2010-09-12 LAB — URINE MICROSCOPIC-ADD ON

## 2010-09-19 LAB — URINE CULTURE: Colony Count: >=100000

## 2010-09-19 LAB — CBC
RBC: 4.99 MIL/uL (ref 3.87–5.11)
WBC: 11.3 10*3/uL — ABNORMAL HIGH (ref 4.0–10.5)

## 2010-09-19 LAB — URINALYSIS, ROUTINE W REFLEX MICROSCOPIC
Glucose, UA: NEGATIVE mg/dL
Protein, ur: NEGATIVE mg/dL
pH: 6 (ref 5.0–8.0)

## 2010-09-19 LAB — URINE MICROSCOPIC-ADD ON

## 2010-09-19 LAB — DIFFERENTIAL
Lymphs Abs: 2.7 10*3/uL (ref 0.7–4.0)
Monocytes Relative: 8 % (ref 3–12)
Neutro Abs: 7.5 10*3/uL (ref 1.7–7.7)
Neutrophils Relative %: 66 % (ref 43–77)

## 2010-09-19 LAB — BASIC METABOLIC PANEL
Calcium: 9.7 mg/dL (ref 8.4–10.5)
Creatinine, Ser: 0.81 mg/dL (ref 0.4–1.2)
GFR calc Af Amer: >60 mL/min (ref >60)

## 2010-09-23 LAB — CBC
HCT: 36.8 % (ref 36.0–46.0)
Hemoglobin: 12.8 g/dL (ref 12.0–15.0)
MCHC: 34.9 g/dL (ref 30.0–36.0)
MCV: 77 fL — ABNORMAL LOW (ref 78.0–100.0)
Platelets: 379 10*3/uL (ref 150–400)
RDW: 15.5 % (ref 11.5–15.5)

## 2010-09-23 LAB — DIFFERENTIAL
Basophils Absolute: 0 10*3/uL (ref 0.0–0.1)
Basophils Relative: 0 % (ref 0–1)
Lymphocytes Relative: 18 % (ref 12–46)
Monocytes Relative: 5 % (ref 3–12)
Neutro Abs: 8.8 10*3/uL — ABNORMAL HIGH (ref 1.7–7.7)
Neutrophils Relative %: 76 % (ref 43–77)

## 2010-09-23 LAB — URINALYSIS, ROUTINE W REFLEX MICROSCOPIC
Nitrite: POSITIVE — AB
Specific Gravity, Urine: 1.01 (ref 1.005–1.030)
Urobilinogen, UA: 0.2 mg/dL (ref 0.0–1.0)
pH: 6 (ref 5.0–8.0)

## 2010-09-23 LAB — COMPREHENSIVE METABOLIC PANEL
Albumin: 4.1 g/dL (ref 3.5–5.2)
Alkaline Phosphatase: 102 U/L (ref 39–117)
BUN: 10 mg/dL (ref 6–23)
Creatinine, Ser: 0.83 mg/dL (ref 0.4–1.2)
Glucose, Bld: 113 mg/dL — ABNORMAL HIGH (ref 70–99)
Potassium: 3.9 mEq/L (ref 3.5–5.1)
Total Bilirubin: 0.6 mg/dL (ref 0.3–1.2)
Total Protein: 7.6 g/dL (ref 6.0–8.3)

## 2010-09-23 LAB — URINE MICROSCOPIC-ADD ON

## 2010-09-23 LAB — URINE CULTURE

## 2010-10-02 LAB — URINE CULTURE

## 2010-10-02 LAB — COMPREHENSIVE METABOLIC PANEL
ALT: 13 U/L (ref 0–35)
Calcium: 9.4 mg/dL (ref 8.4–10.5)
GFR calc Af Amer: >60 mL/min (ref >60)
Glucose, Bld: 141 mg/dL — ABNORMAL HIGH (ref 70–99)
Potassium: 3.9 mEq/L (ref 3.5–5.1)
Sodium: 136 mEq/L (ref 135–145)
Total Protein: 7.9 g/dL (ref 6.0–8.3)

## 2010-10-02 LAB — DIFFERENTIAL
Eosinophils Absolute: 0.1 10*3/uL (ref 0.0–0.7)
Lymphs Abs: 1.9 10*3/uL (ref 0.7–4.0)
Monocytes Relative: 6 % (ref 3–12)
Neutrophils Relative %: 71 % (ref 43–77)

## 2010-10-02 LAB — CBC
Hemoglobin: 12.7 g/dL (ref 12.0–15.0)
MCHC: 33.9 g/dL (ref 30.0–36.0)
Platelets: 323 10*3/uL (ref 150–400)
RDW: 15.4 % (ref 11.5–15.5)

## 2010-10-02 LAB — URINE MICROSCOPIC-ADD ON

## 2010-10-02 LAB — URINALYSIS, ROUTINE W REFLEX MICROSCOPIC
Nitrite: POSITIVE — AB
Specific Gravity, Urine: 1.015 (ref 1.005–1.030)
pH: 8.5 — ABNORMAL HIGH (ref 5.0–8.0)

## 2010-10-09 LAB — COMPREHENSIVE METABOLIC PANEL
ALT: 15 U/L (ref 0–35)
AST: 15 U/L (ref 0–37)
Albumin: 3.8 g/dL (ref 3.5–5.2)
CO2: 24 mEq/L (ref 19–32)
Calcium: 9.6 mg/dL (ref 8.4–10.5)
Chloride: 105 mEq/L (ref 96–112)
GFR calc Af Amer: >60 mL/min (ref >60)
GFR calc non Af Amer: >60 mL/min (ref >60)
Sodium: 135 mEq/L (ref 135–145)
Total Bilirubin: 0.6 mg/dL (ref 0.3–1.2)

## 2010-10-09 LAB — URINALYSIS, ROUTINE W REFLEX MICROSCOPIC
Bilirubin Urine: NEGATIVE
Bilirubin Urine: NEGATIVE
Glucose, UA: NEGATIVE mg/dL
Ketones, ur: NEGATIVE mg/dL
Ketones, ur: NEGATIVE mg/dL
Nitrite: POSITIVE — AB
Protein, ur: NEGATIVE mg/dL
Protein, ur: NEGATIVE mg/dL
Urobilinogen, UA: 0.2 mg/dL (ref 0.0–1.0)

## 2010-10-09 LAB — URINE MICROSCOPIC-ADD ON

## 2010-10-09 LAB — DIFFERENTIAL
Basophils Relative: 0 % (ref 0–1)
Eosinophils Absolute: 0.4 10*3/uL (ref 0.0–0.7)
Eosinophils Absolute: 0.1 10*3/uL (ref 0.0–0.7)
Eosinophils Relative: 5 % (ref 0–5)
Eosinophils Relative: 1 % (ref 0–5)
Lymphs Abs: 2.4 10*3/uL (ref 0.7–4.0)
Monocytes Absolute: 0.6 10*3/uL (ref 0.1–1.0)
Monocytes Absolute: 0.4 10*3/uL (ref 0.1–1.0)
Monocytes Relative: 5 % (ref 3–12)
Neutrophils Relative %: 67 % (ref 43–77)

## 2010-10-09 LAB — POCT I-STAT, CHEM 8
Calcium, Ion: 1.18 mmol/L (ref 1.12–1.32)
Glucose, Bld: 145 mg/dL — ABNORMAL HIGH (ref 70–99)
HCT: 37 % (ref 36.0–46.0)
Hemoglobin: 12.6 g/dL (ref 12.0–15.0)
TCO2: 19 mmol/L (ref 0–100)

## 2010-10-09 LAB — CBC
HCT: 34.8 % — ABNORMAL LOW (ref 36.0–46.0)
MCHC: 35 g/dL (ref 30.0–36.0)
MCV: 72.5 fL — ABNORMAL LOW (ref 78.0–100.0)
Platelets: 380 10*3/uL (ref 150–400)
RBC: 4.55 MIL/uL (ref 3.87–5.11)
RDW: 17.6 % — ABNORMAL HIGH (ref 11.5–15.5)
WBC: 9.1 10*3/uL (ref 4.0–10.5)

## 2010-10-09 LAB — URINE CULTURE
Colony Count: >=100000
Colony Count: >=100000

## 2010-10-10 LAB — COMPREHENSIVE METABOLIC PANEL
ALT: 11 U/L (ref 0–35)
Albumin: 3.7 g/dL (ref 3.5–5.2)
Alkaline Phosphatase: 96 U/L (ref 39–117)
Calcium: 9.8 mg/dL (ref 8.4–10.5)
Potassium: 4.1 mEq/L (ref 3.5–5.1)
Sodium: 137 mEq/L (ref 135–145)
Total Protein: 8.1 g/dL (ref 6.0–8.3)

## 2010-10-10 LAB — URINE CULTURE

## 2010-10-10 LAB — DIFFERENTIAL
Basophils Relative: 1 % (ref 0–1)
Eosinophils Absolute: 0.1 10*3/uL (ref 0.0–0.7)
Lymphs Abs: 1.4 10*3/uL (ref 0.7–4.0)
Monocytes Absolute: 0.5 10*3/uL (ref 0.1–1.0)
Monocytes Relative: 6 % (ref 3–12)
Neutro Abs: 7.3 10*3/uL (ref 1.7–7.7)

## 2010-10-10 LAB — CBC
Platelets: 476 10*3/uL — ABNORMAL HIGH (ref 150–400)
RDW: 17.4 % — ABNORMAL HIGH (ref 11.5–15.5)

## 2010-10-10 LAB — URINALYSIS, ROUTINE W REFLEX MICROSCOPIC
Bilirubin Urine: NEGATIVE
Glucose, UA: NEGATIVE mg/dL
Specific Gravity, Urine: 1.02 (ref 1.005–1.030)
Urobilinogen, UA: 0.2 mg/dL (ref 0.0–1.0)

## 2010-10-10 LAB — URINE MICROSCOPIC-ADD ON

## 2010-10-28 ENCOUNTER — Emergency Department (HOSPITAL_COMMUNITY): Payer: Medicare Other

## 2010-10-28 ENCOUNTER — Inpatient Hospital Stay (HOSPITAL_COMMUNITY)
Admission: EM | Admit: 2010-10-28 | Discharge: 2010-11-02 | DRG: 389 | Disposition: A | Discharge status: Home / Self Care | Payer: Medicare Other | Attending: Family Medicine | Admitting: Family Medicine

## 2010-10-28 DIAGNOSIS — Z923 Personal history of irradiation: Secondary | ICD-10-CM

## 2010-10-28 DIAGNOSIS — E876 Hypokalemia: Secondary | ICD-10-CM | POA: Diagnosis not present

## 2010-10-28 DIAGNOSIS — Z8551 Personal history of malignant neoplasm of bladder: Secondary | ICD-10-CM

## 2010-10-28 DIAGNOSIS — Z9221 Personal history of antineoplastic chemotherapy: Secondary | ICD-10-CM

## 2010-10-28 DIAGNOSIS — N390 Urinary tract infection, site not specified: Secondary | ICD-10-CM | POA: Diagnosis present

## 2010-10-28 DIAGNOSIS — K565 Intestinal adhesions [bands], unspecified as to partial versus complete obstruction: Principal | ICD-10-CM | POA: Diagnosis present

## 2010-10-28 DIAGNOSIS — I10 Essential (primary) hypertension: Secondary | ICD-10-CM | POA: Diagnosis present

## 2010-10-28 DIAGNOSIS — E039 Hypothyroidism, unspecified: Secondary | ICD-10-CM | POA: Diagnosis present

## 2010-10-28 LAB — CK TOTAL AND CKMB (NOT AT ARMC): CK, MB: 2 ng/mL (ref 0.3–4.0)

## 2010-10-28 LAB — URINALYSIS, ROUTINE W REFLEX MICROSCOPIC
Bilirubin Urine: NEGATIVE
Ketones, ur: NEGATIVE mg/dL
Nitrite: POSITIVE — AB
Specific Gravity, Urine: 1.02 (ref 1.005–1.030)
Urobilinogen, UA: 0.2 mg/dL (ref 0.0–1.0)
pH: 6 (ref 5.0–8.0)

## 2010-10-28 LAB — CBC
HCT: 34.4 % — ABNORMAL LOW (ref 36.0–46.0)
Hemoglobin: 11.6 g/dL — ABNORMAL LOW (ref 12.0–15.0)
MCH: 26.2 pg (ref 26.0–34.0)
MCHC: 33.7 g/dL (ref 30.0–36.0)
MCV: 77.8 fL — ABNORMAL LOW (ref 78.0–100.0)
RDW: 16.1 % — ABNORMAL HIGH (ref 11.5–15.5)

## 2010-10-28 LAB — COMPREHENSIVE METABOLIC PANEL
ALT: 19 U/L (ref 0–35)
BUN: 17 mg/dL (ref 6–23)
CO2: 25 mEq/L (ref 19–32)
Calcium: 9.6 mg/dL (ref 8.4–10.5)
GFR calc non Af Amer: 46 mL/min — ABNORMAL LOW (ref >60)
Glucose, Bld: 110 mg/dL — ABNORMAL HIGH (ref 70–99)
Total Protein: 7.1 g/dL (ref 6.0–8.3)

## 2010-10-28 LAB — DIFFERENTIAL
Eosinophils Relative: 2 % (ref 0–5)
Lymphocytes Relative: 15 % (ref 12–46)
Lymphs Abs: 1.1 10*3/uL (ref 0.7–4.0)
Monocytes Absolute: 0.8 10*3/uL (ref 0.1–1.0)
Monocytes Relative: 11 % (ref 3–12)
Neutro Abs: 5.3 10*3/uL (ref 1.7–7.7)

## 2010-10-28 LAB — LIPASE, BLOOD: Lipase: 15 U/L (ref 11–59)

## 2010-10-28 LAB — URINE MICROSCOPIC-ADD ON

## 2010-10-28 LAB — TROPONIN I: Troponin I: 0.01 ng/mL (ref 0.00–0.06)

## 2010-10-28 MED ORDER — IOHEXOL 300 MG/ML  SOLN
100.0000 mL | Freq: Once | INTRAMUSCULAR | Status: AC | PRN
  Administered 2010-10-28: 100 mL via INTRAVENOUS

## 2010-10-29 LAB — DIFFERENTIAL
Basophils Absolute: 0 10*3/uL (ref 0.0–0.1)
Lymphocytes Relative: 14 % (ref 12–46)
Lymphs Abs: 0.9 10*3/uL (ref 0.7–4.0)
Neutro Abs: 5.1 10*3/uL (ref 1.7–7.7)

## 2010-10-29 LAB — BASIC METABOLIC PANEL
BUN: 14 mg/dL (ref 6–23)
CO2: 26 mEq/L (ref 19–32)
Chloride: 100 mEq/L (ref 96–112)
Glucose, Bld: 79 mg/dL (ref 70–99)
Potassium: 3.3 mEq/L — ABNORMAL LOW (ref 3.5–5.1)
Sodium: 133 mEq/L — ABNORMAL LOW (ref 135–145)

## 2010-10-29 LAB — CBC
HCT: 30.1 % — ABNORMAL LOW (ref 36.0–46.0)
Hemoglobin: 10.3 g/dL — ABNORMAL LOW (ref 12.0–15.0)
MCV: 77 fL — ABNORMAL LOW (ref 78.0–100.0)
WBC: 6.9 10*3/uL (ref 4.0–10.5)

## 2010-10-30 LAB — BASIC METABOLIC PANEL
BUN: 10 mg/dL (ref 6–23)
CO2: 21 mEq/L (ref 19–32)
Chloride: 105 mEq/L (ref 96–112)
Glucose, Bld: 75 mg/dL (ref 70–99)
Potassium: 3.4 mEq/L — ABNORMAL LOW (ref 3.5–5.1)

## 2010-10-31 ENCOUNTER — Inpatient Hospital Stay (HOSPITAL_COMMUNITY): Payer: Medicare Other

## 2010-10-31 LAB — BASIC METABOLIC PANEL
CO2: 20 mEq/L (ref 19–32)
Calcium: 9.6 mg/dL (ref 8.4–10.5)
Creatinine, Ser: 0.8 mg/dL (ref 0.4–1.2)
GFR calc Af Amer: >60 mL/min (ref >60)
GFR calc non Af Amer: >60 mL/min (ref >60)

## 2010-11-01 LAB — BASIC METABOLIC PANEL
BUN: 3 mg/dL — ABNORMAL LOW (ref 6–23)
Calcium: 9.5 mg/dL (ref 8.4–10.5)
Chloride: 103 mEq/L (ref 96–112)
Creatinine, Ser: 0.78 mg/dL (ref 0.4–1.2)
GFR calc non Af Amer: >60 mL/min (ref >60)

## 2010-11-01 LAB — URINE CULTURE: Colony Count: >=100000

## 2010-11-02 ENCOUNTER — Emergency Department (HOSPITAL_COMMUNITY)
Admission: EM | Admit: 2010-11-02 | Discharge: 2010-11-03 | Disposition: A | Discharge status: Home / Self Care | Payer: Medicare Other | Attending: Emergency Medicine | Admitting: Emergency Medicine

## 2010-11-02 DIAGNOSIS — Z85528 Personal history of other malignant neoplasm of kidney: Secondary | ICD-10-CM | POA: Insufficient documentation

## 2010-11-02 DIAGNOSIS — T3795XA Adverse effect of unspecified systemic anti-infective and antiparasitic, initial encounter: Secondary | ICD-10-CM | POA: Insufficient documentation

## 2010-11-02 DIAGNOSIS — I10 Essential (primary) hypertension: Secondary | ICD-10-CM | POA: Insufficient documentation

## 2010-11-02 DIAGNOSIS — Z79899 Other long term (current) drug therapy: Secondary | ICD-10-CM | POA: Insufficient documentation

## 2010-11-02 DIAGNOSIS — E039 Hypothyroidism, unspecified: Secondary | ICD-10-CM | POA: Insufficient documentation

## 2010-11-02 DIAGNOSIS — L500 Allergic urticaria: Secondary | ICD-10-CM | POA: Insufficient documentation

## 2010-11-02 DIAGNOSIS — I499 Cardiac arrhythmia, unspecified: Secondary | ICD-10-CM | POA: Insufficient documentation

## 2010-11-02 DIAGNOSIS — K219 Gastro-esophageal reflux disease without esophagitis: Secondary | ICD-10-CM | POA: Insufficient documentation

## 2010-11-02 DIAGNOSIS — I517 Cardiomegaly: Secondary | ICD-10-CM | POA: Insufficient documentation

## 2010-11-07 NOTE — H&P (Signed)
[name], [name] NO.:  [id number]  MEDICAL RECORD NO.:  [id number]           PATIENT TYPE:  I  LOCATION:  A209                          FACILITY:  [facility]  PHYSICIAN:  Jaiyah Beining L. Juanetta Gosling, M.D.DATE OF BIRTH:  October 24, 1950  DATE OF ADMISSION:  10/28/2010 DATE OF DISCHARGE:  LH                             HISTORY & PHYSICAL   Patient of Dr. Janna Arch.  REASON FOR ADMISSION:  Small-bowel obstruction.  HISTORY:  [name] is a 60 year old who has had a history of multiple episodes of small-bowel obstruction.  She has had a bladder cancer and has had a significant surgery on her abdomen and she has actually been she says at [facility] twice this year with small-bowel obstructions and was eventually able to be treated without any sort of surgery.  She started having symptoms about 3 or 4 days ago with nausea, vomiting. She said she did have a bowel movement this morning and she had passed some gas until the last 12 hours or so.  Her pain has become worse.  She has had more vomiting and she came to the emergency room.  Her past medical history is positive for multiple medical problems including history of transitional cell carcinoma of the bladder treated with surgery and chemotherapy.  She has had a cystectomy and multiple bouts of episodes of small-bowel obstruction.  She has had problems with pancytopenia related to chemotherapy.  She has hyperlipidemia, iron deficiency anemia, hypertension and hypothyroidism.  At home, she is taking oxycodone 10 mg q.i.d. p.r.n., Zofran 8 mg as needed, Crestor it is said to be 100 mg daily.  I am not sure what the dose actually is, Synthroid 100 mcg daily.  She takes Senokot, Slow-Mag, Reglan 5 mg t.i.d., Dexilant 60 mg daily, Colace and MiraLax.  She is allergic to CODEINE and SULFA, but says she can take the oxycodone.  Surgically, she has had back surgery twice, had transurethral resection of the bladder tumor, right  ureteral stent, some sort of intestinal injury that was apparently during her back surgery.  She has had a hernia repair with mesh, left partial mastectomy in 2005, and another hernia repair in 2007.  She has also apparently had some mild chronic renal failure.  Her social history, she does not use alcohol or tobacco.  She does not use any illicit drugs.  She has family in the area and her family history is not known to be positive for any sort of a small-bowel problems, GI problems, etc.  Review of systems except as mentioned is negative.  Physical examination shows a well-developed, well-nourished [place] female, who is in no acute distress.  Her temperature is 98.0, blood pressure 111/90, pulse 99, respirations 16 and O2 sat 97%.  Her pupils are reactive to light and accommodation.  Nose and throat are clear.  Mucous membranes are slightly dry.  Her neck is supple without masses.  Her chest clear without wheezes, rales or rhonchi.  Heart is regular without murmur, gallop or rub.  Her abdomen is distended.  Bowel sounds hyperactive.  Extremities showed no edema.  CNS exam grossly intact.  LABORATORY WORK:  She had cardiac panel which is normal.  Urine shows many bacteria, many white cells and red blood cells, but this is from her urostomy, lipase 15.  Common metabolic profile shows her BUN 17, creatinine 1.21.  Sodium is slightly low at 134.  Liver functions are normal and CBC shows white count 7300, hemoglobin is 11.6, platelets 315.  CT abdomen shows a high-grade small-bowel obstruction with dilatation of the stomach.  Assessment then she has recurrent small-bowel obstruction.  We discussed NG tube.  She refuses it at the moment, but understands that if she continues to vomit, she will need to have an NG tube.  We are going to put her on IV fluids, n.p.o., have her take Reglan IV and have pain medications and nausea medications, IV and reevaluate.     Reigna Ruperto L.  Juanetta Gosling, M.D.     ELH/MEDQ  D:  10/28/2010  T:  10/28/2010  Job:  [id number]  Electronically Signed by Kari Baars M.D. on 11/07/2010 12:38:52 PM

## 2010-11-08 NOTE — Discharge Summary (Signed)
  [name], [name] NO.:  [id number]  MEDICAL RECORD NO.:  [id number]           PATIENT TYPE:  LOCATION:                                 FACILITY:  PHYSICIAN:  Melvyn Novas, MDDATE OF BIRTH:  Jan 12, 1951  DATE OF ADMISSION:  10/28/2010 DATE OF DISCHARGE:  05/04/2012LH                              DISCHARGE SUMMARY   The patient is a 60 year old black female with history of multiple recurrent episodes of small bowel obstruction.  She is status post radiation therapy for bladder carcinoma and has been treated conservatively without any surgery over about 8-10 previous episodes. The patient came in with some bowel distention, some abdominal discomfort, was found to have dilated stomach, and air-fluid levels in the small bowel.  Liver function tests were within normal limits and hemoglobin was 11.6 and white count of 7300.  On admission, CT scan showed high-grade small bowel obstruction with dilatation of the stomach.  While in the hospital, she stopped all p.o. meds.  She was kept n.p.o. with NG suction.  Intermittently, this was followed by just n.p.o. as the patient was quite uncomfortable.  She was seen in consultation by Surgery who has seen her several times previously.  They felt surgical intervention was not warranted at present.  She continued to decompress over a 3- to 4-day period, subsequently had clear liquids reinstituted.  Flat upright of the abdomen showed improvement in small bowel obstruction with gaseous distention of large intestine and less distention of stomach.  She tolerated small clear liquids well.  Her potassium was minimally diminished at 3.4 despite IV potassium and her magnesium was low status post complication of chemotherapy.  It was elected to reinstitute her Slow-Mag 400 mg 2 tablets a.c. p.o. b.i.d. and to give her IV potassium.  These will be checked as an outpatient. Her diet was advanced to mechanical soft to see  how she tolerated this overnight and will subsequently be discharged in the morning if she continues to tolerate food well with no emesis, nausea, or abdominal distention.  She was hemodynamically stable throughout hospital.  All other labs were within normal parameters.  Her discharge medicines included: 1. Colace 100 mg p.o. daily. 2. Crestor 10 mg p.o. daily. 3. Synthroid 100 mcg p.o. daily. 4. Metoclopramide 10 mg p.o. t.i.d. 5. MiraLax one scoop by mouth p.o. b.i.d. 6. Senna oral over-the-counter 1 tablet p.o. b.i.d. p.r.n. 7. Slow-Mag Plus D 143 mg and 405 mg of calcium 2 tablets p.o. b.i.d. 8. Septra DS p.o. b.i.d. for UTI.  She also had UTI in the hospital     which was treated with IV Cipro.     Melvyn Novas, MD     RMD/MEDQ  D:  11/01/2010  T:  11/02/2010  Job:  [id number]  Electronically Signed by Oval Linsey MD on 11/08/2010 03:09:55 PM

## 2010-11-11 ENCOUNTER — Emergency Department (HOSPITAL_COMMUNITY): Payer: Medicare Other

## 2010-11-11 ENCOUNTER — Inpatient Hospital Stay (HOSPITAL_COMMUNITY)
Admission: EM | Admit: 2010-11-11 | Discharge: 2010-11-15 | DRG: 389 | Disposition: A | Discharge status: Home / Self Care | Payer: Medicare Other | Attending: Family Medicine | Admitting: Family Medicine

## 2010-11-11 DIAGNOSIS — Z8551 Personal history of malignant neoplasm of bladder: Secondary | ICD-10-CM

## 2010-11-11 DIAGNOSIS — E785 Hyperlipidemia, unspecified: Secondary | ICD-10-CM | POA: Diagnosis present

## 2010-11-11 DIAGNOSIS — N390 Urinary tract infection, site not specified: Secondary | ICD-10-CM | POA: Diagnosis present

## 2010-11-11 DIAGNOSIS — I10 Essential (primary) hypertension: Secondary | ICD-10-CM | POA: Diagnosis present

## 2010-11-11 DIAGNOSIS — F411 Generalized anxiety disorder: Secondary | ICD-10-CM | POA: Diagnosis present

## 2010-11-11 DIAGNOSIS — E039 Hypothyroidism, unspecified: Secondary | ICD-10-CM | POA: Diagnosis present

## 2010-11-11 DIAGNOSIS — K565 Intestinal adhesions [bands], unspecified as to partial versus complete obstruction: Principal | ICD-10-CM | POA: Diagnosis present

## 2010-11-11 DIAGNOSIS — Z923 Personal history of irradiation: Secondary | ICD-10-CM

## 2010-11-11 LAB — URINALYSIS, ROUTINE W REFLEX MICROSCOPIC
Glucose, UA: NEGATIVE mg/dL
Specific Gravity, Urine: 1.025 (ref 1.005–1.030)
pH: 6 (ref 5.0–8.0)

## 2010-11-11 LAB — COMPREHENSIVE METABOLIC PANEL
AST: 19 U/L (ref 0–37)
Albumin: 4 g/dL (ref 3.5–5.2)
Calcium: 11.2 mg/dL — ABNORMAL HIGH (ref 8.4–10.5)
Chloride: 103 mEq/L (ref 96–112)
Creatinine, Ser: 1.01 mg/dL (ref 0.4–1.2)
GFR calc Af Amer: >60 mL/min (ref >60)
Total Bilirubin: 0.5 mg/dL (ref 0.3–1.2)

## 2010-11-11 LAB — CBC
MCH: 25.5 pg — ABNORMAL LOW (ref 26.0–34.0)
Platelets: 410 10*3/uL — ABNORMAL HIGH (ref 150–400)
RBC: 4.59 MIL/uL (ref 3.87–5.11)

## 2010-11-11 LAB — DIFFERENTIAL
Basophils Relative: 0 % (ref 0–1)
Eosinophils Absolute: 0.1 10*3/uL (ref 0.0–0.7)
Monocytes Relative: 8 % (ref 3–12)
Neutrophils Relative %: 74 % (ref 43–77)

## 2010-11-11 LAB — URINE MICROSCOPIC-ADD ON

## 2010-11-12 DIAGNOSIS — K56609 Unspecified intestinal obstruction, unspecified as to partial versus complete obstruction: Secondary | ICD-10-CM

## 2010-11-12 DIAGNOSIS — R112 Nausea with vomiting, unspecified: Secondary | ICD-10-CM

## 2010-11-12 LAB — URINE MICROSCOPIC-ADD ON

## 2010-11-12 LAB — URINALYSIS, ROUTINE W REFLEX MICROSCOPIC
Ketones, ur: 15 mg/dL — AB
Nitrite: POSITIVE — AB

## 2010-11-13 LAB — BASIC METABOLIC PANEL
BUN: 11 mg/dL (ref 6–23)
Chloride: 105 mEq/L (ref 96–112)
Glucose, Bld: 60 mg/dL — ABNORMAL LOW (ref 70–99)
Potassium: 3.6 mEq/L (ref 3.5–5.1)
Sodium: 136 mEq/L (ref 135–145)

## 2010-11-13 LAB — MAGNESIUM: Magnesium: 1.5 mg/dL (ref 1.5–2.5)

## 2010-11-13 NOTE — Op Note (Signed)
[name], [name]             ACCOUNT NO.:  [id number]   MEDICAL RECORD NO.:  [id number]          PATIENT TYPE:  AMB   LOCATION:  [facility]                         FACILITY:  [facility]   PHYSICIAN:  Mark C. Vernie Ammons, M.D.  DATE OF BIRTH:  Jan 04, 1951   DATE OF PROCEDURE:  04/20/2008  DATE OF DISCHARGE:                               OPERATIVE REPORT   PREOPERATIVE DIAGNOSIS:  History of bladder cancer.   POSTOPERATIVE DIAGNOSIS:  History of bladder cancer.   PROCEDURE:  1. Diagnostic cystoscopy.  2. Examination under anesthesia.  3. Transurethral resection of bladder lesion/tumor.  4. Random bladder biopsies.   SURGEON:  Mark C. Vernie Ammons, M.D.   ANESTHESIA:  General.   BLOOD LOSS:  Minimal.   SPECIMENS:  Cold cup biopsies from the posterior wall of the bladder,  left wall of the bladder, bladder neck at the 6 o'clock position, right  trigonal region, and bladder neck at the 7-8 o'clock position.   DRAINS:  None.   COMPLICATIONS:  None.   INDICATIONS:  The patient is a 60 year old female who was originally  found, at the time of a urethral diverticulectomy, to have an irritated-  appearing area on the floor of the bladder on the right-hand side.  The  urethral diverticulum revealed inflammation and no evidence of  malignancy.  Because of the irritated area, I repeated a cystoscopy in  my office and noted it was still present, so on Nov 25, 2007 she  underwent further evaluation and was found to have atypical cells and  underwent a transurethral resection on December 14, 2007.  This revealed  high-grade urothelial carcinoma with extensive glandular differentiation  invading into the lamina propria only with some urothelial carcinoma in  situ from the right wall/trigonal region.  There was CIS noted at the  dome of the bladder as well.  There was no evidence of muscularis  involvement.  She underwent BCG plus Intron A therapy, and has returned  for repeat bladder biopsies.  The risks,  complications, alternatives,  and limitations were discussed.  The patient understands and elected to  proceed.  Of note, she reported in the preop holding area that she had  not been feeling well for the past several days and had seen her primary  care physician.  Laboratory results obtained revealed a normal white  count of 9.3 with no evidence of anemia and her electrolytes were  entirely normal with a creatinine of 0.8.   DESCRIPTION OF OPERATION:  After informed consent, the patient was  brought to the major OR, placed on the table, administered general  anesthesia, then moved to the dorsal lithotomy position.  Her genitalia  was sterilely prepped and draped and an official time-out was then  performed.  Initially the 22-French cystoscope was introduced into the  bladder and the bladder was fully inspected.  I filled her bladder to  capacity under anesthesia and then drained the bladder and found the  bladder capacity to be 175 mL.  Full inspection revealed a normal-  appearing left ureteral orifice, however, there did appear to be a  nodular area  in the area of the right ureteral orifice and I could not  identify it, so indigo carmine was administered intravenously to allow  identification of the UO.  I also identified an erythematous patch on  the posterior wall of the bladder.  No definite papillary tumors were  identified.   The cold cup biopsy forceps were then used and I obtained a cold cup  biopsy from the posterior wall of the bladder, the left wall of the  bladder, the bladder neck at the 6 o'clock position, and these were sent  to pathology separately.  I then fulgurated these 3 locations with the  Bugbee electrode.  I then removed the cystoscope and inserted the  resectoscope, and by that time, I noted blue urine effluxing from the  right ureteral orifice but I did not see any blue emanating from the  left orifice region.  I therefore used the resectoscope and was able  to  resect away a nodular area that involved the area of the right ureteral  orifice and was able to identify the right ureteral orifice.  Blue  effluxing urine could be identified coming through this and I had used  the Pure Cut to unroof the orifice and therefore did not coagulate  anywhere near that location.  This was sent to pathology and then I did  a resection of some firm area that appeared to involve the bladder neck  at about the 7-8 o'clock position.  This was also sent to pathology.  I  then fulgurated all bleeding points and drained the bladder.  The  resectoscope was then removed.   I performed an examination under anesthesia, performing bimanual exam.  I felt some possible fullness on the right-hand side but certainly no  nodularity whatsoever when compared to the left.  Definitely there was  no fixation of the bladder or mass palpable.  The patient was therefore  awakened and taken to the recovery room in stable satisfactory  condition.  She tolerated procedure well and there were no  intraoperative complications.   She will be given a prescription for Vicodin HP #36 and Pyridium 200 mg  #36 and then will follow up in my office in 1 week to discuss the  results of her pathology and further treatment based on that.      Mark C. Vernie Ammons, M.D.  Electronically Signed     MCO/MEDQ  D:  04/20/2008  T:  04/20/2008  Job:  [id number]

## 2010-11-13 NOTE — Assessment & Plan Note (Signed)
[facility]                          [facility] OFFICE NOTE   NAME:[name], [name]                    MRN:          [id number]  DATE:01/22/2007                            DOB:          20-May-1951    PRIMARY CARDIOLOGIST:  Learta Codding, MD,FACC.   REASON FOR VISIT:  Post-hospital followup.   [name] is a 60 year old female with numerous cardiac risk factors  but no previous cardiac history, recently seen in consultation by myself  and Dr. Andee Lineman here at [facility] for evaluation of chest pain.  She had just had an Adenosine stress Cardiolite the day prior which was  worrisome for anterolateral ischemia as well as the possibility of  balance ischemia.  Left ventricular function was preserved.   Patient was transferred directly to [facility] for cardiac  catheterization, which was performed the following day by Dr. Tonny Bollman.  This, however, revealed only minor nonobstructive CAD with  normal left ventricular function and elevated LVEDP.  Dr. Excell Seltzer  recommended medical therapy with LDL target of 100 or less.   Patient now presents in followup.  She has had two brief episodes of  nonexertional chest discomfort and otherwise has been doing well.  Unfortunately, she continues to smoke.  She notes no complications of  the right groin incision site but does complain of some right calf  soreness.   Electrocardiogram today reveals NSR at 70 beats per minute with normal  axis and early repolarization changes.   MEDICATIONS:  1. Low-dose aspirin.  2. AcipHex.  3. Synthroid 0.15 daily.  4. Benicar 40 daily.   PHYSICAL EXAMINATION:  VITAL SIGNS:  Blood pressure 126/89, pulse 70 and  regular, weight 215.  GENERAL:  A 60 year old female, obese, sitting upright in no distress.  HEENT:  Normocephalic and atraumatic.  NECK:  Palpable bilateral carotid pulses without bruits.  LUNGS:  Clear to auscultation in all fields.  HEART:   Regular rate and rhythm (S1 and S2).  No significant murmurs.  ABDOMEN:  Protuberant.  Benign.  EXTREMITIES:  Right groin stable with no hematoma, ecchymosis, or bruits  on auscultation; palpable femoral and posterior tibialis pulse with  trace pedal edema.  NEURO:  No focal deficits.   IMPRESSION:  1. Nonobstructive coronary artery disease.      a.     By cardiac catheterization following a false positive       Adenosine stress Cardiolite suggestive of possible balanced       ischemia.      b.     Normal left ventricular function.  2. Multiple cardiac risk factors.      a.     Hypertension.      b.     Dyslipidemia.  Recent lipid profile notable for total       cholesterol of 209, HDL 30, and LDL 151.      c.     Longstanding tobacco.      d.     Age.      e.     Family history.      f.  Hyperglycemia with recent hemoglobin A1C 6.3.  3. Hypothyroidism.      a.     Recent TSH level of 0.28.  4. Status post recent urinary tract infection.      a.     Treated with ciprofloxacin.  5. Gastroesophageal reflux disease.   PLAN:  1. No further cardiac workup indicated.  Patient will return to Dr.      Lewayne Bunting on an as-needed basis.  2. Recommend continued aggressive risk factor modification which will      be deferred to Dr. Jen Mow.  In particular, I expressed to the patient      the importance of smoking cessation, blood pressure control, and      consideration of being placed on a statin for aggressive management      of hyperlipidemia.  We recommend a target LDL goal of 100 or less.  3. Recommend repeat TSH level and will defer this to Dr. Jen Mow.  4. Continue on low dose aspirin indefinitely.      Rozell Searing, PA-C  Electronically Signed      Jonelle Sidle, MD  Electronically Signed   GS/MedQ  DD: 01/22/2007  DT: 01/23/2007  Job #: [id number]   cc:   Erle Crocker, M.D.

## 2010-11-13 NOTE — Op Note (Signed)
Charlene, [name]             ACCOUNT NO.:  [id number]   MEDICAL RECORD NO.:  [id number]          PATIENT TYPE:  AMB   LOCATION:  [facility]                         FACILITY:  [facility]   PHYSICIAN:  Mark C. Vernie Ammons, M.D.  DATE OF BIRTH:  August 07, 1950   DATE OF PROCEDURE:  11/25/2007  DATE OF DISCHARGE:                               OPERATIVE REPORT   PREOPERATIVE DIAGNOSES:  Bladder lesion.   POSTOPERATIVE DIAGNOSES:  Bladder lesion.   PROCEDURE:  1. Cystoscopy.  2. Right retrograde pyelogram with interpretation.  3. Bladder biopsies.  4. Hydrodistention.   SURGEON:  Mark C. Vernie Ammons, M.D.   ANESTHESIA:  General.   SPECIMENS:  1. Barbotage urine for cytology.  2. Cold cup biopsies (one right wall, two right lateral trigone, three      posterior wall) all to pathology.   BLOOD LOSS:  Minimal.   DRAINS:  None.   COMPLICATIONS:  None.   INDICATIONS:  The patient is a 60 year old female who originally was  sent to me with what was felt to be a bladder diverticulum and workup  confirmed that this was the case.  She was having a lot of irritative  voiding symptoms at the time and it was felt possibly due to the bladder  diverticulum and my feeling was that correction of this would likely  result in resolution of her irritative voiding symptoms.  The surgery  proceeded without complication and the patient recovered from the  surgery but continued to have significant urgency, frequency and  irritative voiding symptoms.  She has not had any flank pain and CT scan  had been obtained which revealed no evidence of distal ureteral stone or  hydronephrosis.  There was no extrinsic mass seen either.  At the time  of her diverticulectomy, cystoscopy had revealed what appeared to be  irritation of the right wall of the bladder which although seeming odd  was felt possibly due to inflammation.  Despite that I did obtain a  biopsy from that region that revealed no evidence of malignancy.   She  continued to have irritative symptoms and therefore is brought to the  operating room today because repeat cystoscopy in my office recently  revealed persistence of the irritative area on the right wall of the  bladder.  The risks, complications, alternatives, limitations were  discussed.  The patient understands and elects to proceed.   DESCRIPTION OF OPERATION:  After informed consent, the patient was  brought to the major OR, placed on the table, administered general  anesthesia then moved to the dorsal lithotomy position.  Genitalia was  sterilely prepped and draped.  An official time-out was then performed.  I then proceeded with rigid cystoscopy using the 22-French rigid scope  and 12 degrees lens.  The bladder was fully inspected and I did note  what appeared to be a mildly erythematous patch on the posterior wall of  the bladder, directly posterior as well as what appears to be just  bullous inflammation and inflammatory changes along the right wall of  the bladder.  It did not appear to involve the bladder  neck but did  appeared to involve the right ureteral orifice.  It was difficult to  identify the orifice.  On the left side the orifice was identified  easily and appeared normal.  I therefore switched to a 70 degree lens  and was able to see clear efflux coming from the right ureteral orifice  region, however the orifice appeared stenotic.   A 5-French open-ended ureteral catheter was then placed through the  cystoscope using the Albarran's bridge and 70 degrees lens and the 0.038  inch floppy tip guidewire was passed through the open-ended stent and I  was able to get this into the right ureteral orifice and up the right  ureter under direct fluoroscopy.  I then passed the open-ended catheter  over the guidewire into the ureter and removed the guidewire.   I left the open-end stent in the area of the renal pelvis and reinserted  the cystoscope so that I could identify  the location of the ureteral  orifice and obtain bladder biopsies.  The biopsies were obtained from  the right wall of the bladder of which two cold cup biopsies were  obtained from this location.  The second biopsy was from an area just  posterior and lateral to the right ureteral orifice but well away from  the orifice.  The fourth biopsy from the third location was from the  posterior wall of the bladder directly posterior.  These locations were  then fulgurated with the Bugbee electrode.  This controlled all  bleeding.   I then back loaded the cystoscope over the ureteral catheter and then  injected full strength contrast to perform a retrograde pyelogram.  This  was performed under direct fluoroscopy and I noted the ureter to be  nondilated and was free of any filling defects.  As I withdrew the open-  ended catheter I injected contrast under direct fluoroscopy and I did  find some mild bulbar dilation of the distal ureter almost suggesting a  possible mild ureterocele, however, once I removed the catheter and  watched contrast pass down the ureter, I noted there was no dilation in  that area whatsoever and also no evidence of obstruction.   While I was performing the procedure I noted the bladder seemed to fill  very quickly and required emptying quite often.  It seemed as if her  bladder capacity was markedly diminished and I therefore elected to  perform hydrodistention both from a diagnostic and therapeutic  standpoint.  I therefore raised the water to 100 cm above the bladder  and filled her bladder to 100 cm water.  I then drained the bladder and  noted the bladder capacity was approximately 100 mL.  I then reinspected  the bladder and noted no petechial hemorrhages or glomerulations to  suggest interstitial cystitis.  I repeated the hydrodistention a second  time and again, she leaked around the cystoscope quickly.  I therefore  rechecked her bladder volume and again noted  it was only 100 mL under  anesthesia.  I therefore drained the bladder after reinspecting and  noted no further bleeding or perforation of the bladder and clear reflux  from the right orifice.  The patient was awakened and taken to recovery  room in stable satisfactory condition.  She tolerated the procedure  well.  There were no intraoperative complications.  She will follow-up  with me in the office in 1 week to discuss the results of the pathology.  I gave her a  prescription for 40 Pyridium plus and 36 Vicodin HP and of  note her cytology sent from the office was suspicious for urothelial  malignancy.      Mark C. Vernie Ammons, M.D.  Electronically Signed     MCO/MEDQ  D:  11/25/2007  T:  11/25/2007  Job:  [id number]

## 2010-11-13 NOTE — Discharge Summary (Signed)
[name], [name] NO.:  [id number]   MEDICAL RECORD NO.:  [id number]          PATIENT TYPE:  INP   LOCATION:  4706                         FACILITY:  [facility]   PHYSICIAN:  Jesse Sans. Wall, MD, FACCDATE OF BIRTH:  [birth year]   DATE OF ADMISSION:  01/15/2007  DATE OF DISCHARGE:                               DISCHARGE SUMMARY   ADDENDUM:  If you could add to the discharge diagnoses:  Urinary tract infection.   And her discharge medications:  Cipro 500 mg b.i.d. for 2 more days.      Joellyn Rued, PA-C      Jesse Sans. Daleen Squibb, MD, [facility]  Electronically Signed    EW/MEDQ  D:  01/16/2007  T:  01/16/2007  Job:  [id number]

## 2010-11-13 NOTE — Cardiovascular Report (Signed)
NAMESHANAUTICA, FORKER NO.:  [id number]   MEDICAL RECORD NO.:  [id number]          PATIENT TYPE:  INP   LOCATION:  4706                         FACILITY:  [facility]   PHYSICIAN:  Veverly Fells. Excell Seltzer, MD  DATE OF BIRTH:  1950-11-15   DATE OF PROCEDURE:  01/16/2007  DATE OF DISCHARGE:  01/16/2007                            CARDIAC CATHETERIZATION   PROCEDURE:  Left heart catheterization, selective coronary angiography,  left ventricular angiography and StarClose of the right femoral artery.   INDICATIONS:  Mrs. Schwarting is a 60 year old woman with risk factors of  hypertension and dyslipidemia who presented with chest pain to [facility].  She had an abnormal Myoview study with transient ischemic  dilatation and was referred for cardiac catheterization.  The patient  has no documented history of CAD.   Risks and indications of procedure were explained to the patient.  Informed consent was obtained.  The right groin was prepped, draped,  anesthetized 1% lidocaine.  Using modified Seldinger technique, a 6-  French sheath was placed in the right femoral artery.  Multiple views of  the left and right coronary arteries were taken using standard Judkins  catheters.  Following angiography, an angled pigtail catheter was  inserted in the left ventricle and pressures were recorded.  Left  ventriculogram was performed.  A pull-back across the aortic valve was  done.  At the completion of the procedure, a StarClose device was used  to seal the femoral arteriotomy.  There were no immediate complications.  All catheter exchanges were performed over a guidewire.   FINDINGS:  Aortic pressure is 109/67 with a mean of 87.  Left  ventricular pressure is 110/24.   The left mainstem has very minor irregularities.  It is widely patent.  It trifurcates into the LAD, circumflex and intermediate branch.   The LAD is a large-caliber vessel that courses down and wraps around the  LV  apex.  It supplies two medium-size diagonal branches.  There is  nonobstructive plaque in the proximal LAD as well as the mid-LAD.  There  is no significant stenosis of the LAD or its branches.   Ramus intermedius is relatively small.  There is no significant  angiographic disease.   Left circumflex is large-caliber.  There is nonobstructive plaque in the  proximal portion.  There is a small first OM and a medium-size second OM  branch.  There is also a left PDA.  Circumflex and right coronary artery  are codominant.   The right coronary artery is medium sized.  It supplies an RV marginal  branch and terminates in a small PDA with codominant circulation.  The  right coronary artery has no significant angiographic disease.   Left ventricular function assessed by ventriculography is normal with an  LVEF of 55%.  There is no mitral regurgitation.   ASSESSMENT:  1. Minor nonobstructive coronary artery disease.  2. Normal left ventricular systolic function.  3. Elevated left ventricular end-diastolic pressure.   PLAN:  Recommend medical therapy.  The patient has very minor coronary  artery disease.  Her goal LDL should be less  than 100.  She should be  eligible for discharge home later today.      Veverly Fells. Excell Seltzer, MD  Electronically Signed     MDC/MEDQ  D:  01/16/2007  T:  01/17/2007  Job:  [id number]   cc:   Learta Codding, MD,FACC

## 2010-11-13 NOTE — Op Note (Signed)
[name], [name]             ACCOUNT NO.:  [id number]   MEDICAL RECORD NO.:  [id number]          PATIENT TYPE:  AMB   LOCATION:  NESC                         FACILITY:  [facility]   PHYSICIAN:  Mark C. Vernie Ammons, M.D.  DATE OF BIRTH:  1951/06/30   DATE OF PROCEDURE:  DATE OF DISCHARGE:                               OPERATIVE REPORT   PREOPERATIVE DIAGNOSES:  Urethral diverticulum.   POSTOPERATIVE DIAGNOSES:  1. Urethral diverticulum.  2. Bladder lesion.   PROCEDURE:  1. Cystoscopy with bladder biopsy  2. Urethral diverticulectomy.   SURGEON:  Dr. Ihor Gully.   ANESTHESIA:  General.   DRAINS:  18-French Foley catheter.   SPECIMENS:  Diverticulum wall and bladder biopsy to pathology.   BLOOD LOSS:  Approximately 50 mL.   COMPLICATIONS:  None.   INDICATIONS:  The patient is a 60 year old female with irritative  voiding symptoms who was found to have a urethral diverticulum both on  CT scan as well as cystoscopically.  Exam confirmed that as well and she  is brought to the OR today for excision of her urethral diverticulum.  We discussed the risks, complications and alternatives and she has  elected to proceed.   DESCRIPTION OF OPERATION:  After informed consent, the patient was  brought to the major OR and placed on the table,  administered general  anesthesia and then moved to the exaggerated lithotomy position.  The  genitalia was then sterilely prepped and draped and an official time-out  was then performed.  Initially a 22-French cystoscope with the 12 degree  lens was inserted in the urethra under direct visualization. I noted  what appeared to be the mouth of the diverticulum. It appeared fairly  wide mouth but I really could not get a look into the diverticulum  itself through the urethra with the rigid scope. I advanced the scope  into the bladder and I found involving the right ureteral orificial  region as well as right hemi trigone what appeared to be a lot  of  bullous edema.  I administered 1 ampule of indigo carmine in order to  attempt to identify the ureteral orifice and in doing that I was able to  identify a location where blue dye was exiting.  I then used the cold  cup biopsy forceps to obtain a biopsy away from this area and sent this  to pathology. The biopsy location was then fulgurated to control  bleeding.   I then attempted to pass a 5-French open-ended catheter and then a  floppy tip guidewire through the catheter into where the right ureteral  orifice appeared to be as indicated by the dye exiting that location;  however, I could get either of these into the distal ureter.  I also  noted as I probed with the open-ended catheter that that area appeared  to be somewhat fixed and this would most likely be consistent with some  surrounding inflammation, possibly from the diverticulum.   I then removed the cystoscope and inserted a 16-French Foley catheter  initially to delineate urethra and injected 0.5% Marcaine with  epinephrine beneath the  subvaginal mucosa in an inverted U pattern.  After allowing adequate time for epinephrine effect, I made an incision  starting at the entrance of the introitus and extending this posterior  laterally in an inverted U fashion.  I raised a flap of vaginal mucosa  that was good and thick with good blood supply and then incised over the  urethral diverticulum.  In doing so, I was able to identify the  diverticulum but it was not tense.   I removed the Foley catheter and inserted a right angle clamp per  urethra and I was able to pass this into the diverticulum.  I therefore  used an 8-French Foley catheter with the tip cut off and used the right  angle clamp to pass this in the diverticulum and filled the Foley  catheter balloon nicely delineating the diverticulum.  I then incised  using the Strully scissors along the diverticulum on both sides and  inferiorly.  I was able to dissect nearly  completely around the  diverticulum with this technique and some blunt dissection as well.  I  then opened the diverticulum, deflated the 8-French catheter and it and  placed a 16-French Foley catheter in the urethra. I was easily able to  identify the urethra and excise the diverticulum nearly flush with the  urethra.  I tagged the mucosal edges both proximally and distally as  well as laterally with 4-0 chromic suture and then noted that the  diverticular mouth was somewhat long and did not lend itself to closure  transversely.  I therefore closed longitudinally with running 4-0  chromic suture.  During this time, no blue fluid was seen in the vaginal  incision from the ureter and by palpation the diverticulum did not  approach the ureter, although certainly may have been in close  approximation with the trigonal region over on the right-hand side.  I  irrigated the wound copiously with saline and then closed a second layer  of tissue advancing it from proximal to distal and secured this with  interrupted 2-0 Vicryl suture. This effectively isolated the urethral  closure in the midline by closing this transversely over the incision.  I then excised redundant vaginal mucosa and reapproximated the vaginal  mucosal edges with running 2-0 Vicryl suture after irrigating the wound  copiously again.  I removed the 16-French Foley catheter and placed an  18-French Foley catheter in the bladder and connected this to closed  system drainage.  2 inch Iodoform gauze with bacitracin was then used as  vaginal packing and the patient was awakened and taken to the recovery  room in stable satisfactory condition.  She tolerated the procedure well  and there were no intraoperative complications.  She received  preoperative antibiotics and will be maintained on Keflex 500 mg t.i.d.  for 2 weeks and also be given a prescription for 40 Tylox and will  follow-up in my office in 2 weeks for a Foley catheter  removal.  Written  instructions on catheter care and wound care were given to the patient  upon discharge.      Mark C. Vernie Ammons, M.D.  Electronically Signed     MCO/MEDQ  D:  08/24/2007  T:  08/24/2007  Job:  [id number]

## 2010-11-13 NOTE — Discharge Summary (Signed)
NAMEBRYLIN, [name] NO.:  [id number]   MEDICAL RECORD NO.:  [id number]          PATIENT TYPE:  INP   LOCATION:  4706                         FACILITY:  [facility]   PHYSICIAN:  Veverly Fells. Excell Seltzer, MD  DATE OF BIRTH:  [birth year]   DATE OF ADMISSION:  01/15/2007  DATE OF DISCHARGE:  01/16/2007                         DISCHARGE SUMMARY - REFERRING   DISCHARGE DIAGNOSES:  1. Noncardiac chest discomfort.  2. False positive adenosine Myoview.  3. Nonobstructive plaquing with ejection fraction of 55% on cardiac      catheterization.  History as noted below.   PROCEDURES PERFORMED:  Cardiac catheterization January 16, 2007 by Dr.  Excell Seltzer.   BRIEF HISTORY:  [name] is a 60 year old African American female who  presented to the emergency room on the 16th with midsternal sharp chest  discomfort that began after having a bowel movement.  She has noticed  some fatigue and minimal dyspnea but no associated nausea, vomiting,  diaphoresis or radiation.  She did not take any medications for the  discomfort, hoping that the discomfort would be relieved spontaneously;  however, it persisted, thus her presentation.  An adenosine Myoview was  performed by Dr. Diona Browner in [place] that showed normal LV function and  wall motion.  However, there is a partial reversibility of the anterior  and lateral walls with a TID ratio of 1:3 raising the possibility of  balanced ischemia.  Thus she was transferred to [facility] for further  evaluation by cardiac catheterization.   PAST MEDICAL HISTORY:  1. Notable for hypothyroidism.  2. Hypertension.  3. Dyslipidemia.  4. Hyperglycemia.  5. GERD.  6. Back surgery.  7. Intestinal resection secondary to perforation in 1996.  8. Obesity.  9. Tobacco use.   LABORATORY:  Chest x-ray on the 17th showed low-volume exam with  atelectasis as previously.  No acute pneumonia or CHF.  EKG showed  normal sinus rhythm, normal axis, early R-wave.  There were no  labs  performed at [facility]   HOSPITAL COURSE:  [name] was transferred to [facility] on  January 15, 2007 for further evaluation of her discomfort and abnormal  stress test.  Cardiac catheterization performed on January 16, 2007 by Dr.  Excell Seltzer showed nonobstructive plaque in the LAD and circumflex.  RCA was  free of disease.  Normal LV function with an elevated LVEDP.  Dr. Excell Seltzer  felt that her chest discomfort was not cardiac in etiology and felt that  the patient could be discharged home.  StarClose vascular closure system  was applied.  Post bedrest she was ambulating in the hall without  difficulty.  She complained of a slight numbness at her catheterization  site; however, this resolved post ambulation.  The patient was  discharged home.   DISPOSITION:  The patient is discharged home.  She received wound care  and activities supplemental discharge sheet in regards to cardiac  catheterization.  She was asked to maintain a low-sodium heart-healthy  diet.   Her medications at discharge include the same as they were on admission.  These include:  1. Synthroid 0.15 mg  daily.  2. Benicar 5 mg daily.  3. AcipHex 20 mg daily.  4. Aspirin 81 mg daily.   She was advised no smoking or tobacco products and to bring all  medications to all followup appointments.  Prior to discharge she was  also counseled on smoking cessation.   TIME:  Twenty-five minutes.      Joellyn Rued, PA-C      Veverly Fells. Excell Seltzer, MD  Electronically Signed    EW/MEDQ  D:  01/16/2007  T:  01/16/2007  Job:  [id number]   cc:   Learta Codding, MD,FACC  Erle Crocker, M.D.

## 2010-11-13 NOTE — Op Note (Signed)
[name], RUDE NO.:  [id number]   MEDICAL RECORD NO.:  [id number]          PATIENT TYPE:  AMB   LOCATION:  NESC                         FACILITY:  [facility]   PHYSICIAN:  Mark C. Vernie Ammons, M.D.  DATE OF BIRTH:  1951/06/17   DATE OF PROCEDURE:  12/14/2007  DATE OF DISCHARGE:                               OPERATIVE REPORT   PREOPERATIVE DIAGNOSIS:  Transitional cell carcinoma of the bladder.   POSTOPERATIVE DIAGNOSIS:  Transitional cell carcinoma of the bladder.   PROCEDURE:  1. Cystoscopy with transurethral resection of bladder tumor (aggregate      size approximately 6 cm) and bladder biopsy.  2. Right ureteral stent placement.   SURGEON:  Mark C. Vernie Ammons, M.D.   ANESTHESIA:  General.   DRAINS:  None.   BLOOD LOSS:  Minimal.   SPECIMENS:  1. Portions of resected bladder tumor from the right wall of the      bladder.  2. Biopsy of the dome of bladder.   COMPLICATIONS:  None.   INDICATIONS:  The patient is a 60 year old white female who has smoked  in the past.  She has had significant irritative voiding symptoms and  initially underwent a urethral diverticulectomy and at that time I noted  some inflammation of her bladder wall on the right-hand side and did a  biopsy.  It revealed inflammation only.  When she returned for follow-  up, she continued to have irritative symptoms and continued to have a  very inflamed-appearing right wall of the bladder.  I did further  biopsying of the bladder and this revealed transitional cell carcinoma  that appeared to be high-grade.  There was what appeared to be lamina  propria invasion on one of the specimens.  Because of this, she is  brought back for further resectional biopsies of the bladder in order to  further stage this cancer.  We discussed the risks, complications,  alternatives and limitations.  The patient understands and elect to  proceed.   DESCRIPTION OF OPERATION:  After informed consent, the  patient went to  the major OR, placed on the table and administered general anesthesia,  then moved to the dorsal lithotomy position.  Genitalia was sterilely  prepped and draped and an official time-out was then performed.  Initially, the 22-French cystoscope with 12-degree lens was inserted in  the bladder.  The patient was administered an ampule of indigo carmine  and I noted the left orifice appeared normal.  The right orifice was  difficult to visualize.  After allowing time for the indigo carmine to  circulate, I then did see blue effluxing from the area of the right  orifice and therefore passed a 6-French open-ended ureteral catheter  through the cystoscope.  Through this, a 0.038 inch floppy tip Glidewire  was then passed up the right ureter without difficulty.  I then passed  the open-ended catheter over the guidewire and removed the guidewire  leaving the open-ended catheter in place in order to identify the  ureteral orifice throughout the resection.   The cystoscope was removed and the 28-French resectoscope with  Timberlake obturator was inserted in the bladder.  The obturator removed  and the 12-degree lens with the resectoscope element inserted.  I began  resecting the abnormal-appearing mucosa from the floor of the bladder  just to the right of the midline and progressed on up to the 9 o'clock  position on the bladder.  I resected from the posterior bladder on out  to near the bladder neck but did not include the bladder neck as it  appeared normal.  I resected all of the abnormal-appearing mucosa  completely.  I then fulgurated all bleeding points.  I took care to not  resect in the area of the left ureteral orifice and left this intact.  This was photographed.   I then used the resectoscope loop to obtain several biopsies from the  dome of the bladder where there did appear to be some slightly  erythematous, irregular mucosa.  I then drained the bladder and made   sure upon reinspection that there was no bleeding, nor was there any  evidence of perforation of the bladder.  The bladder was drained, the  patient was awakened and taken to recovery room in stable satisfactory  condition.  She tolerated the procedure well.  There were no  intraoperative complications.  She will be given a prescription for 36  Vicodin HP, 40 peridium 200 mg and will return to my office for follow-  up in 1 week to discuss the pathology results.      Mark C. Vernie Ammons, M.D.  Electronically Signed     MCO/MEDQ  D:  12/14/2007  T:  12/14/2007  Job:  [id number]

## 2010-11-14 ENCOUNTER — Inpatient Hospital Stay (HOSPITAL_COMMUNITY): Payer: Medicare Other

## 2010-11-14 DIAGNOSIS — K56609 Unspecified intestinal obstruction, unspecified as to partial versus complete obstruction: Secondary | ICD-10-CM

## 2010-11-14 LAB — BASIC METABOLIC PANEL
BUN: 5 mg/dL — ABNORMAL LOW (ref 6–23)
CO2: 22 mEq/L (ref 19–32)
GFR calc non Af Amer: >60 mL/min (ref >60)
Glucose, Bld: 82 mg/dL (ref 70–99)
Potassium: 3.3 mEq/L — ABNORMAL LOW (ref 3.5–5.1)

## 2010-11-16 LAB — URINE CULTURE
Culture  Setup Time: 201205150117
Special Requests: NEGATIVE

## 2010-11-16 NOTE — Discharge Summary (Signed)
NAMEMARYKAY, [name] NO.:  [id number]   MEDICAL RECORD NO.:  [id number]          PATIENT TYPE:  INP   LOCATION:  A306                          FACILITY:  [facility]   PHYSICIAN:  Dirk Dress. Katrinka Blazing, M.D.   DATE OF BIRTH:  1951/06/02   DATE OF ADMISSION:  12/03/2005  DATE OF DISCHARGE:  06/15/2007LH                                 DISCHARGE SUMMARY   DISCHARGE DIAGNOSIS:  1.  Large small bowel diverticulum.  2.  Chronic abdominal wall fistula with infected mesh.  3.  Extensive intra-abdominal adhesions.  4.  Hypertension.  5.  Postoperative atelectasis with leukocytosis.  6.  Hypokalemia.   SPECIAL PROCEDURES:  Extensive adhesiolysis with small bowel resection.   DISPOSITION:  The patient discharged home in stable satisfactory condition.   DISCHARGE MEDICATIONS:  Cipro 500 mg b.i.d., Flagyl 500 mg t.i.d., Tylox two  every 4 hours as needed for pain potassium, 20 mEq twice daily.   The patient  to be seen in the office 10 days post discharge.  She is to  continue the AcipHex, Synthroid and Benicar, understanding that the patient  was taking before admission.   SUMMARY:  60 year old female with a history of recurrent abdominal pain,  episodic nausea and vomiting.  She was seen in the emergency room on 27 May  with severe abdominal pain, nausea, vomiting.  She was treated, but symptoms  became worse.  CT of the abdomen was done and this showed a;n 8x9x10 cm mass  in the anterior aspect of the lower abdomen and the mid to upper pelvis,  which contained a large volume of high density material.  This was felt to  represent a large small bowel diverticulum.  The mass was felt to encroach  on the  anterior abdominal wall and was in direct continuity with the  midline..  She remained symptomatic and the mass was increasing in size so  she was scheduled for exploratory laparotomy.  The patient was explored on  June 5, and she had extensive adhesions of her entire small bowel.   There  was a large small bowel diverticulum with near complete obstruction to the  exit from the diverticulum.  There was a piece of mesh at the inferior  aspect of her incision that was chronically infected and was connected to a  loop of small bowel that was proximal to the large diverticulum.  The old  mesh was taken out entirely.  Wound cultures were taken.  The diverticulum  and the area of inflamed small bowel proximal to the diverticulum were  resected.  Primary anastomosis was carried out.  Colds irrigation of the  peritoneal cavity was carried out with Bacitracin solution.  The patient  tolerated the operative procedure well.  She had some itching with morphine.  She had leukocytosis in the early postoperative period and this was felt to  be due to pulmonary atelectasis as she was very slow to move.  Her course  otherwise was prolonged but she did not have any significant difficulties.  Because of the extensive adhesiolysis she had a prolonged ileus which was  not and unanticipated.  She started to have return of intestinal function on  the eighth postoperative day and she tolerated liquids thereafter.  Her diet  was advanced.  The wound appeared to do exceptionally well.  Cultures from  the infected area grew out E-coli.  On the 15th she was stable.  She was afebrile.  She was having regular bowel  movements.  Her wound looked nice, white count was down to 13,000 and she  was discharged home on Cipro and then  Flagyl with plans to be seen in the  office 10 days post discharge.  Thank you      Dirk Dress. Katrinka Blazing, M.D.  Electronically Signed     LCS/MEDQ  D:  01/16/2006  T:  01/16/2006  Job:  [id number]

## 2010-11-16 NOTE — Op Note (Signed)
NAME:  [name], [name]                       ACCOUNT NO.:  [id number]   MEDICAL RECORD NO.:  [id number]                   PATIENT TYPE:  AMB   LOCATION:  DAY                                  FACILITY:  [facility]   PHYSICIAN:  Jerolyn Shin C. Katrinka Blazing, M.D.                DATE OF BIRTH:  [birth year]   DATE OF PROCEDURE:  DATE OF DISCHARGE:                                 OPERATIVE REPORT   PREOPERATIVE DIAGNOSIS:  Left breast mass.   POSTOPERATIVE DIAGNOSIS:  Left breast mass, pathology pending.   PROCEDURE:  Left partial mastectomy.   SURGEON:  Dirk Dress. Katrinka Blazing, M.D.   DESCRIPTION OF PROCEDURE:  Under general anesthesia, the left breast and  chest wall were prepped and draped in a sterile field.   There was a large mass at the base of the nipple extending out onto the  areola.  The mass was widely excised using an elliptical incision.  It  extended into the deep substance of the breast.  All of this was widely  excised, with a good margin of normal-appearing tissue.  The specimen was  extended down to the deep breast tissue and excised.  It was sent to  pathology for frozen section.  Frozen section is still pending at this time.  The deep tissues were closed with a 2-0 Biosyn.  The subcutaneous tissue was  closed with 3-0 Biosyn.  The skin was closed with a subcuticular 4-0 Dexon.   The patient tolerated the procedure well.  Local infiltration with 0.5%  Marcaine with epinephrine was carried out.  A sterile dressing was placed.  She was awakened from anesthesia uneventfully, transferred to a bed, and  taken to the postanesthetic care unit for monitoring.      ___________________________________________                                            Dirk Dress. Katrinka Blazing, M.D.   LCS/MEDQ  D:  08/30/2003  T:  08/30/2003  Job:  [id number]

## 2010-11-16 NOTE — H&P (Signed)
NAMEMERIDETH, BOSQUE NO.:  [id number]   MEDICAL RECORD NO.:  [id number]          PATIENT TYPE:  INP   LOCATION:  A306                          FACILITY:  [facility]   PHYSICIAN:  Dirk Dress. Katrinka Blazing, M.D.   DATE OF BIRTH:  08-24-1950   DATE OF ADMISSION:  DATE OF DISCHARGE:  LH                                HISTORY & PHYSICAL   HISTORY OF PRESENT ILLNESS:  A 60 year old female with a history of  recurrent abdominal pain, episodic nausea and vomiting.  The patient was  seen in the emergency room on Nov 24, 2005 with severe abdominal pain,  nausea and vomiting.  She was treated, but symptoms became much worse.  She  had a CT scan of the abdomen which was done on Nov 28, 2005.  This showed an  8 x 9 x 10 cm mass in the anterior aspect of the lower abdomen and the mid  to upper pelvis which contained high density material.  This was compared  with similar findings on October 15, 2002, and it was felt to represent a  large small bowel diverticulum.  The mass was felt to encroach on the  anterior abdominal wall and was in direct continuity with the midline.  The  mass also was felt to contain debris.  It is felt that the patient has an  inflamed small bowel mass or large diverticulum.  It may also represented  her anastomosis from 1996 when she had a small bowel resection.  Nevertheless, it remains symptomatic and is increasing in size, and the  patient is having exploratory laparotomy for evaluation.  She was treated  symptomatically from Nov 28, 2005 until now, and her symptoms are improved.   PAST HISTORY:  1.  Hypertension.  2.  Hypothyroidism.  3.  Gastroesophageal reflux disease.  4.  Chronic low back pain.  5.  There is a history of rectal bleeding, but she has a normal colonoscopy      in 2006.   SURGERY:  1.  An L5-S1 diskectomy with laminectomy on December 11, 1994.  2.  She had exploratory laparotomy with small bowel resection of small bowel      perforation on December 15, 1994.  She subsequently had cholecystectomy and      exploratory laparotomy with adhesiolysis and mesh repair of incisional      hernia.  3.  Left partial mastectomy.   MEDICATIONS:  1.  Benicar 40 mg daily.  2.  AcipHex 20 mg daily.  3.  Lotrel 5/10 daily.  4.  Tylox one q.i.d. p.r.n.  5.  Synthroid 150 mcg daily.  6.  Levsin 0.125 mg a.c. and h.s.  7.  Phenergan 25 mg every 6 hours.   SOCIAL HISTORY:  The patient is a single mother of 2 children, disabled.  She smokes one pack of cigarettes per day.  She does not drink.   PHYSICAL EXAMINATION:  GENERAL:  The patient is in mild distress due to  abdominal pain.  VITAL SIGNS:  Blood pressure 140/83, pulse 92, respirations 20, weight 200  pounds.  HEENT:  Unremarkable.  NECK:  Supple without JVD, bruit, adenopathy or thyromegaly.  CHEST:  Clear to auscultation.  HEART:  Regular rate and rhythm without murmur, gallop, or rub.  ABDOMEN:  Obese with some moderate distention.  Diffuse epigastric and  hypogastric tenderness.  Normoactive bowel sounds.  EXTREMITIES:  No clubbing, cyanosis, or edema.  NEUROLOGIC:  No focal motor, sensory or cerebellar deficits.   IMPRESSION:  1.  Severe abdominal pain with enlarging small bowel diverticulum or      __________ of anterior abdominal wall with inflammation and debris,      etiology to be determined.  2.  Hypertension.  3.  Gastroesophageal reflux disease.  4.  Hypothyroidism.  5.  Chronic low back pain due to chronic disk disease.   PLAN:  Exploratory laparotomy with probable small bowel resection and  resection of the mass of the small bowel.      Dirk Dress. Katrinka Blazing, M.D.  Electronically Signed     LCS/MEDQ  D:  12/02/2005  T:  12/02/2005  Job:  [id number]

## 2010-11-16 NOTE — Procedures (Signed)
NAMEJOYCE, [name] NO.:  [id number]   MEDICAL RECORD NO.:  [id number]          PATIENT TYPE:  OUT   LOCATION:  RAD                           FACILITY:  [facility]   PHYSICIAN:  Vida Roller, M.D.   DATE OF BIRTH:  Jan 18, 1951   DATE OF PROCEDURE:  07/30/2004  DATE OF DISCHARGE:                                  ECHOCARDIOGRAM   REFERRING PHYSICIAN:  Jerolyn Shin C. Katrinka Blazing, M.D.   TAPE NUMBER:  [id number].   TAPE COUNT:  [id number] through [id number].   This is a 60 year old woman with SVT for LV systolic function.  The  technical quality of the study is adequate, though some views are difficult  to interpret.   M-MODE TRACINGS:  The aorta was 27 mm.   The left atrium was 39 mm.   The septum was 12 mm.   The posterior wall was 12 mm.   The left ventricular systolic dimension is 25 mm.   The left ventricular diastolic dimension is 26 mm.   TWO-DIMENSIONAL AND DOPPLER IMAGING:  The left ventricle is normal size with  normal systolic function.  Estimated ejection fraction 60-65%.  There is  mild concentric left ventricular hypertrophy.  There are no obvious wall  motion abnormalities.   The right ventricle is normal size with normal systolic function.   Both atria appear to be normal size.   The aortic valve is morphologically unremarkable with no stenosis or  regurgitation.   The mitral valve has trivial insufficiency.   The tricuspid valve has trivial insufficiency.   The pulmonic valve is not well seen.   There is no pericardial effusion.   The inferior vena cava appears to be normal size.   The ascending aorta was not well seen.      JH/MEDQ  D:  07/30/2004  T:  07/30/2004  Job:  [id number]

## 2010-11-16 NOTE — Op Note (Signed)
NAMEDONNIS, PECHA NO.:  [id number]   MEDICAL RECORD NO.:  [id number]          PATIENT TYPE:  INP   LOCATION:  A306                          FACILITY:  [facility]   PHYSICIAN:  Dirk Dress. Katrinka Blazing, M.D.   DATE OF BIRTH:  [birth year]   DATE OF PROCEDURE:  12/03/2005  DATE OF DISCHARGE:  12/13/2005                                 OPERATIVE REPORT   PREOPERATIVE DIAGNOSIS:  Small bowel mass.   POSTOPERATIVE DIAGNOSIS:  Large small bowel diverticulum, chronic abdominal  wall fistula, extensive intestinal adhesions.   PROCEDURE:  Exploratory laparotomy with extensive adhesiolysis, small bowel  resection.   SURGEON:  Dr. Katrinka Blazing.   DESCRIPTION:  Under general anesthesia the patient's abdomen was prepped and  draped in a sterile field.  Old lower abdominal incision was opened with  excision of scar.  In the lower aspect of the wound above the suprapubic  area mesh was encountered.  This mesh appeared to be acutely inflamed.  Cultures were taken.  There was a loop of small bowel that was intimately  associated with the mass and it appeared that the mesh had eroded into the  wall of small bowel.  Once the peritoneal cavity was entered there were  extensive adhesions involving essentially every loop of small bowel.  A very  methodical adhesiolysis had to be carried out of all of the small bowel  loops in order to gain access to the large diverticulum that appeared to be  in the mid and distal ileum.  The entire small bowel was freed up.  The  large diverticulum was inspected and it appeared to be acutely and  chronically inflamed in the wall.  There was an area proximal to the small  bowel proximal to the diverticulum that appeared to connect with the  anterior abdominal wall.  This was dissected and was excised.  There was no  normal small bowel to reanastomose since all of the small bowel wall  appeared to be thickened.  Side-to-side enteroenterostomy was carried out  using  GIA stapler and a TA-60 stapler.  All staple lines were reinforced  using running locking 3-0 Prolene.  Copious irrigation was carried out.  The  fascia and mesh in the inferior aspect of the incision was excised.  This  wound was then copiously irrigated with bacitracin solution.  It was felt  that we would probably do a primary closure without placement of any type of  foreign body, since there was infection in the area.  Wound was closed with  #1 Prolene.  Subcutaneous fat was irrigated with bacitracin solution.  Subcutaneous fat was then closed with 2-0 Monocryl and skin was closed with  staples.  The patient tolerated procedure well.  Blood loss was 150 mL.  Sterile dressing was placed.  She was awakened from anesthesia uneventfully  transferred to a bed and taken to the postanesthetic care unit in  satisfactory condition.      Dirk Dress. Katrinka Blazing, M.D.  Electronically Signed    LCS/MEDQ  D:  01/16/2006  T:  01/16/2006  Job:  [id number]

## 2010-11-16 NOTE — H&P (Signed)
   NAME:  [name], [name]                       ACCOUNT NO.:  [id number]   MEDICAL RECORD NO.:  [id number]                   PATIENT TYPE:  AMB   LOCATION:  DAY                                  FACILITY:  [facility]   PHYSICIAN:  Jerolyn Shin C. Katrinka Blazing, M.D.                DATE OF BIRTH:  02/13/1951   DATE OF ADMISSION:  DATE OF DISCHARGE:                                HISTORY & PHYSICAL   HISTORY OF PRESENT ILLNESS:  The patient is a 60 year old female with 3-  month history of rectal bleeding.  The patient has had blood with her bowel  movements.  She states that she is bleeding with each bowel movement.  There  are periods in which she has leakage of blood without a bowel movement.  She  is scheduled for colonoscopy.   PAST HISTORY:  She has hypertension, hypothyroidism, gastroesophageal reflux  disease.   SURGERY:  L5-S1 diskectomy/laminectomy, small-bowel resection, exploratory  laparotomy with adhesiolysis and mesh repair of incision hernia,  cholecystectomy.   MEDICATIONS:  Hydrochlorothiazide 12.5 mg once daily, Synthroid 150 mcg once  daily, Aciphex 20 mg once daily, Benefiber once daily.   SOCIAL HISTORY:  She does not drink.  She smokes a half pack of cigarettes  per day.  There is no history of drug use.   PHYSICAL EXAMINATION:  VITAL SIGNS:  Blood pressure 180/98, pulse 108,  respirations 24, weight 215 pounds.  GENERAL:  The patient is a healthy-appearing female in no acute distress.  HEENT:  Unremarkable.  NECK:  Supple without JVD or bruit.  CHEST:  Clear to auscultation; no rales, rubs, rhonchi, or wheezes.  HEART:  Regular rate and rhythm without murmur, gallop, or rub.  ABDOMEN:  Mild epigastric tenderness.  No palpable masses.  Normal bowel  sounds.  RECTAL:  No mass.  Stool guaiac positive.  EXTREMITIES:  No cyanosis, clubbing, or edema.  NEUROLOGICAL:  No focal motor, sensory, or cerebellar deficit.   IMPRESSION:  1. Recurrent rectal bleeding.  2.  Hypertension.  3.     Hypothyroidism.  4. Gastroesophageal reflux disease.   PLAN:  The patient will have total colonoscopy.                                               Dirk Dress. Katrinka Blazing, M.D.    LCS/MEDQ  D:  09/07/2002  T:  09/07/2002  Job:  [id number]

## 2010-11-23 NOTE — Consult Note (Signed)
NAMEZURISADAI, HELMINIAK             ACCOUNT NO.:  [id number]  MEDICAL RECORD NO.:  [id number]           PATIENT TYPE:  I  LOCATION:  A227                          FACILITY:  [facility]  PHYSICIAN:  R. Roetta Sessions, M.D. DATE OF BIRTH:  1950-09-20  DATE OF CONSULTATION: DATE OF DISCHARGE:                                CONSULTATION   PRIMARY GASTROENTEROLOGIST:  Jonette Eva, MD  REQUESTING PHYSICIAN:  Melvyn Novas, MD  REASON FOR CONSULTATION:  Small bowel obstruction.  HISTORY OF PRESENT ILLNESS:  Ms. Peltz is a 60 year old black female who has actually been seen in consultation with our practice before for small bowel obstruction.  She was initially evaluated by [place] back in November 2011.  She has an extensive history of chronic abdominal pain, multiple adhesions, multiple surgeries.  She tells me she has had 4 admissions with small bowel obstructions this year.  She was in her usual state of health until 2 days ago around 3:30 in the morning, she developed severe cramp-like lower-to-mid abdominal pain.  This was then followed by nausea and vomiting.  She then presented to the hospital. She tells me she has not vomited in 24 hours.  She did have 3 loose bowel movements yesterday after a dose of MiraLax.  Her last CT scan of abdomen and pelvis with IV contrast was on October 28, 2010, which showed a high-grade proximal small bowel obstruction, distended stomach with fluid, a transition point in pelvis near surgical clips.  She has history of transitional cell carcinoma status post surgery which was complicated by bowel perforation and she tells me she required life support for some time after this.  She also had perforated bowel with a back surgery which had a similar course.  She denies any rectal bleeding or melena.  Denies any stools today.  She was seen by Dr. Leticia Penna earlier this year.  He felt that she was not a candidate for surgery at that time here at [facility].  She has been started on Cipro 500 mg q.8 h. for UTI.  She has had acute abdominal series which showed air fluid levels in a mildly distended small bowel, question early small bowel obstruction.  PAST MEDICAL AND SURGICAL HISTORY: 1. Multiple small bowel obstructions as described in HPI. 2. She has history of multiple abdominal surgeries and perforations as     described in HPI. 3. She has hypertension. 4. GERD. 5. Transitional cell bladder carcinoma treated by [facility]. 6. She has chronic iron deficiency anemia. 7. Hypothyroidism. 8. Hyperlipidemia. 9. Hemorrhoids. 10.She has had multiple back surgeries. 11.Urethral diverticulectomy in February 2011. 12.Multiple bladder biopsies in 2009. 13.Transurethral resection of the bladder tumor and right ureteral     stent placement in June 2009. 14.She has had a hernia repair with mesh by Dr. Katrinka Blazing. 15.Left partial mastectomy in 2005. 16.June 2007, exploratory lap with extensive lysis of adhesions and     small bowel resection secondary to chronic abdominal wall fistula     and a large small bowel diverticulum.  MEDICATIONS PRIOR TO ADMISSION: 1. Crestor 10 mg daily. 2. Synthroid 100 mcg  daily. 3. Senokot 1 tablet daily. 4. Flomax 400 mg 2 tablets p.o. before meal t.i.d. 5. Dexilant 60 mg daily. 6. Colace 100 mg daily. 7. MiraLax 17 g daily p.r.n.  ALLERGIES:  CODEINE causes hives, SULFA causes syncope.  FAMILY HISTORY:  Noncontributory.  SOCIAL HISTORY:  She denies any tobacco, alcohol, or drug use.  REVIEW OF SYSTEMS:  See HPI, otherwise negative review of systems.  PHYSICAL EXAMINATION:  VITAL SIGNS:  Temperature 97.6, pulse 72, respirations 22, blood pressure 114/69, O2 sat 94% on room air, her weight is 74.84 kg and height is 65.5 inches. GENERAL:  She is alert, oriented, cooperative black female in no acute distress. HEENT.  Sclerae clear, nonicteric.  Conjunctivae pink.  Oropharynx pink and moist  without any lesions. NECK:  Supple without mass or thyromegaly. CHEST:  Heart regular rate and rhythm.  Normal S1, S2 without murmurs, clicks, rubs, or gallops. LUNGS:  Clear to auscultation bilaterally. ABDOMEN:  Moderately distended.  She does have positive bowel sounds x4. She does have a urostomy which site is clear.  She has clear yellow urine in the bag.  Abdomen is nontender without palpable mass or hepatosplenomegaly. EXTREMITIES:  Without edema.  LABORATORY STUDIES:  Hemoglobin 11.7, hematocrit 35, platelets 410, white blood cell count 5.9.  CMP is normal except for glucose that is 60, lipase 13.  Urinalysis shows some blood, protein, ketones, SEC, white blood cells, and bacteria.  She is on antibiotics in the way of Cipro 500 mg q.8 h. for UTI.  IMPRESSION:  Ms. Kohn is a 60 year old black female with recurrent small bowel obstruction.  I have reviewed CT films with Dr. Jena Gauss.  It seems as though she has a partial mechanical small bowel obstruction secondary to adhesions.  CT noted the transition point in her pelvis near her previous surgical clips.  She has done well with supportive measures only the last 3 admissions.  We will need to monitor her.  If she has no improvement or recurrence, she is going to need surgical evaluation at a tertiary care center given her history of multiple complications.  PLAN: 1. Clear liquid diet.  We would advance to a low-residue diet with 6     small meals a day if she is able to tolerate. 2. Discontinue Reglan as it is not indicated at this time. 3. We would continue to monitor and consider tertiary care referral if     there is no improvement.  Thank you Dr. Janna Arch for allowing [place] to participate in the care of Ms. Kathreen Cosier.     Lorenza Burton, N.P.   ______________________________ R. Roetta Sessions, M.D.    KJ/MEDQ  D:  11/13/2010  T:  11/13/2010  Job:  [id number]  cc:   Melvyn Novas, MD Fax:  [phone]  Electronically Signed by Lorenza Burton N.P. on 11/20/2010 09:07:05 AM Electronically Signed by Lorrin Goodell M.D. on 11/23/2010 01:47:54 PM

## 2011-02-05 NOTE — H&P (Signed)
NAME:  [name], [name] NO.:  [id number]  MEDICAL RECORD NO.:  [id number]           PATIENT TYPE:  LOCATION:                                 FACILITY:  PHYSICIAN:  Melvyn Novas, MDDATE OF BIRTH:  Apr 28, 1951  DATE OF ADMISSION: DATE OF DISCHARGE:  LH                             HISTORY & PHYSICAL   The patient is a 60 year old black female, recently discharged from the hospital with small-bowel obstruction, was doing well for approximately 1 week.  She has a history of transitional cell bladder CA, status post cystectomy with urostomy tube, and status post chemotherapy for this carcinoma.  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  Essentially, she was doing well.  She had a normal liquid bowel movement yesterday. She denies nausea or vomiting.  However, she stated her abdomen felt like it was "ripping apart."  She denied any melena, hematemesis, or hematochezia.  She reported to the ER where upon acute abdominal series revealed some air fluid levels with some mildly distended small bowel loops consistent with early small-bowel obstruction, which radiographically is a better picture than her CT scan performed 10 days ago.  She was at that time seen in by surgical consultation with no surgical therapy indicated at that time.  She denies angina, orthopnea, PND.  She does have significant amount of anxiety.  The patient will be admitted and placed n.p.o. perhaps advance diet to clear liquids as tolerated and seen in consultation by Gastroenterology as this is a recurring phenomena.  PAST MEDICAL HISTORY:  Significant for recurrent small-bowel obstruction, bladder carcinoma, which is transitional CA, recurrent UTIs status post chemotherapy, pancytopenia secondary to chemotherapy, hypomagnesemia secondary to  chemotherapy, hypokalemia, hypertension, hypothyroidism, anxiety, iron-deficiency anemia, and hyperlipidemia.  PAST SURGICAL HISTORY:  Remarkable for bladder resection and then followed by cystectomy with a urostomy tube and cholecystectomy.  She has also had lumbosacral laminectomy x2.  CURRENT MEDICINES: 1. Crestor 10 mg p.o. daily. 2. Synthroid 100 mcg p.o. daily. 3. Senokot one p.o. daily. 4. Slow-Mag 400 mg two tablets p.o. a.c. t.i.d. 5. Dexilant 60 mg per day. 6. Colace 100 mg per day. 7. MiraLax 17.5 mg daily p.r.n.  ALLERGIES:  She has no known allergies.  PHYSICAL EXAMINATION:  VITAL SIGNS:  Temperature 98.5, pulse of 99 and regular, respiratory rate is 20, blood pressure is 118/73, O2 sat is 97% on room air. HEENT:  Head, normocephalic, atraumatic.  Eyes, PERRLA.  Extraocular movement intact. Sclerae clear.  Conjunctivae pink. NECK:  No JVD.  No carotid bruits.  No thyromegaly.  No thyroid bruits. HEART:  Regular rhythm.  No murmurs, gallops, heaves, thrills, or rubs. ABDOMEN:  Soft.  Bowel sounds normoactive.  No peristaltic rushes.  No borborygmi.  No guarding.  No rebound. EXTREMITIES:  Trace to 1+ pedal edema. NEUROLOGIC:  The patient appears quite anxious.  Cranial nerves II through XII grossly intact.  The patient moves all four extremities. Plantars are downgoing.  IMPRESSION:  As follows. 1. Recurrent small-bowel obstruction, mild distended small bowel  loops     with air-fluid levels. 2. Status post bladder resection for transitional cell cancer, status     post radiation and chemotherapy. 3. Recurrent urinary tract infection with urostomy. 4. Hypomagnesemia. 5. Hypertension. 6. Hypothyroidism. 7. Anxiety. 8. Hyperlipidemia.  PLAN:  At present is to place the patient n.p.o., monitor electrolytes, review clinical status.  There was definitely no vomiting.  We will see in consultation by Gastroenterology to see if there is further recommendations and I  will make further recommendations as the database expands.     Melvyn Novas, MD     RMD/MEDQ  D:  11/12/2010  T:  11/13/2010  Job:  [id number]  Electronically Signed by Oval Linsey MD on 02/05/2011 03:06:39 PM

## 2011-02-05 NOTE — Discharge Summary (Signed)
NAME:  KYLAR, SPEELMAN NO.:  [id number]  MEDICAL RECORD NO.:  [id number]           PATIENT TYPE:  I  LOCATION:  A227                          FACILITY:  [facility]  PHYSICIAN:  Melvyn Novas, MDDATE OF BIRTH:  Sep 14, 1950  DATE OF ADMISSION:  11/11/2010 DATE OF DISCHARGE:  LH                              DISCHARGE SUMMARY   The patient is a 60 year old black female with multiple recurrent episodes of small bowel obstruction, which present for some diffuse abdominal pain, some air-fluid levels noted on chest x-ray and/or CT scans on previous admissions.  However, she continues to have normal bowel movements throughout these periods.  She never has nausea, vomiting, melena hematemesis, hematochezia.  She was again admitted for this similar symptoms.  Surgery has seen her multiple times with no surgical end point in time.  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  While in the hospital, her electrolytes were essentially normal.  Her potassium dipped to 3.3, this was repleted with K-Dur 20 mEq p.o. daily. She was kept n.p.o. for a 36-hour period with multiple bowel movements. Following the next day, no vomiting.  She was advanced to full liquids and tolerated this well.  She was seen in consultation at this time by Gastroenterology who felt that discontinuation of her metoclopramide 5 mg p.o. t.i.d. was possibly advantages to her as this was not affecting her outcome of small bowel obstruction.  Her magnesium was normal.  She has chronic hypomagnesemia due to chemotherapy from transitional cell bladder CA.  She likewise has hypertension, hyperlipidemia, anxiety disorder, irritable bowel syndrome, and GERD as well as hypothyroidism which is well corrected and  supplemented.  The patient did not have abdominal pain, no anemia, no melena, no hematochezia and was advancing diet, and was felt that she could go home.  She was cautioned to use essentially a liquid to soft diet which she usually does.  We also had a long discussion that further recurrences of small bowel obstruction should be taken care of at a [facility]. Consideration is given to surgical intervention for possible adhesions.  She was subsequently discharged on the following medicines: 1. Magnesium oxide 40 mg 2 tablets p.o. a.c. t.i.d. 2. K-Dur 20 mEq p.o. daily. 3. Crestor 10 mg p.o. daily. 4. Synthroid 100 mcg p.o. daily. 5. Metoclopramide 5 mg p.o. t.i.d., she was told not to take this but     to have it at home for possible future reference. 6. MiraLax 1 scoop p.o. b.i.d. p.r.n. 7. Protonix 40 mg p.o. daily.  The patient understands parameters for returning to the ER and for referring herself to a [facility] where she was cared for at [facility] in the Oncology Clinic.     Melvyn Novas, MD     RMD/MEDQ  D:  11/15/2010  T:  11/15/2010  Job:  [id number]  Electronically Signed by Oval Linsey MD on 02/05/2011 03:06:45 PM

## 2011-03-22 LAB — POCT I-STAT 4, (NA,K, GLUC, HGB,HCT)
Glucose, Bld: 147 — ABNORMAL HIGH
HCT: 42
Hemoglobin: 14.3
Operator id: 114531
Potassium: 4.1
Sodium: 136

## 2011-03-27 LAB — POCT CARDIAC MARKERS: Operator id: 228551

## 2011-03-27 LAB — D-DIMER, QUANTITATIVE: D-Dimer, Quant: 0.39

## 2011-03-27 LAB — DIFFERENTIAL
Basophils Absolute: 0.1
Basophils Relative: 1
Eosinophils Absolute: 0.3
Lymphs Abs: 3.1
Neutrophils Relative %: 55

## 2011-03-27 LAB — CBC
MCHC: 35.4
MCV: 76.5 — ABNORMAL LOW
Platelets: 350
WBC: 9.2

## 2011-03-27 LAB — POCT I-STAT 4, (NA,K, GLUC, HGB,HCT)
Glucose, Bld: 119 — ABNORMAL HIGH
HCT: 44
Hemoglobin: 15
Operator id: 280881
Potassium: 4.2
Sodium: 140

## 2011-03-27 LAB — BASIC METABOLIC PANEL
BUN: 8
Chloride: 107
Creatinine, Ser: 0.82

## 2011-03-28 ENCOUNTER — Emergency Department (HOSPITAL_COMMUNITY): Payer: Medicare Other

## 2011-03-28 ENCOUNTER — Encounter: Payer: Self-pay | Admitting: *Deleted

## 2011-03-28 ENCOUNTER — Emergency Department (HOSPITAL_COMMUNITY)
Admission: EM | Admit: 2011-03-28 | Discharge: 2011-03-28 | Disposition: A | Discharge status: Home / Self Care | Payer: Medicare Other | Attending: Emergency Medicine | Admitting: Emergency Medicine

## 2011-03-28 DIAGNOSIS — N390 Urinary tract infection, site not specified: Secondary | ICD-10-CM | POA: Insufficient documentation

## 2011-03-28 DIAGNOSIS — Z09 Encounter for follow-up examination after completed treatment for conditions other than malignant neoplasm: Secondary | ICD-10-CM | POA: Insufficient documentation

## 2011-03-28 DIAGNOSIS — Z87891 Personal history of nicotine dependence: Secondary | ICD-10-CM | POA: Insufficient documentation

## 2011-03-28 DIAGNOSIS — Z79899 Other long term (current) drug therapy: Secondary | ICD-10-CM | POA: Insufficient documentation

## 2011-03-28 HISTORY — DX: Gastro-esophageal reflux disease without esophagitis: K21.9

## 2011-03-28 HISTORY — DX: Malignant (primary) neoplasm, unspecified: C80.1

## 2011-03-28 HISTORY — DX: Unspecified intestinal obstruction, unspecified as to partial versus complete obstruction: K56.609

## 2011-03-28 LAB — POCT I-STAT 4, (NA,K, GLUC, HGB,HCT)
Glucose, Bld: 109 — ABNORMAL HIGH
HCT: 42
Hemoglobin: 14.3
Operator id: 114531
Potassium: 3.9
Sodium: 140

## 2011-03-28 LAB — URINALYSIS, ROUTINE W REFLEX MICROSCOPIC
Protein, ur: 30 mg/dL — AB
Urobilinogen, UA: 0.2 mg/dL (ref 0.0–1.0)

## 2011-03-28 LAB — URINE MICROSCOPIC-ADD ON

## 2011-03-28 MED ORDER — NITROFURANTOIN MONOHYD MACRO 100 MG PO CAPS: 100.0000 mg | ORAL_CAPSULE | Freq: Two times a day (BID) | ORAL | Status: AC

## 2011-03-28 MED ORDER — NITROFURANTOIN MONOHYD MACRO 100 MG PO CAPS
100.0000 mg | ORAL_CAPSULE | Freq: Once | ORAL | Status: DC
  Filled 2011-03-28: qty 1

## 2011-03-28 MED ORDER — NITROFURANTOIN MACROCRYSTAL 100 MG PO CAPS
ORAL_CAPSULE | ORAL | Status: AC
  Filled 2011-03-28: qty 1

## 2011-03-28 MED ORDER — NITROFURANTOIN MONOHYD MACRO 100 MG PO CAPS
100.0000 mg | ORAL_CAPSULE | Freq: Once | ORAL | Status: AC
  Administered 2011-03-28: 100 mg via ORAL

## 2011-03-28 NOTE — ED Provider Notes (Signed)
History     CSN: [id number] Arrival date & time: 03/28/2011  6:27 PM  Chief Complaint  Patient presents with  . picc line check     (Consider location/radiation/quality/duration/timing/severity/associated sxs/prior treatment) The history is provided by the patient.  60 year old female comes in to have her PICC line checked. She has a home health nurse come twice a week and they routinely draw blood from her PICC line. 2 days ago the nurse was unable to recall blood from the PICC line and she was advised to be seen to have it checked. She gave herself her routine nighttime TPN 2 days ago and yesterday and the fluid infused without difficulty. Her own health nurse came again today and was again unable to draw blood. Patient is not seeing anyone regarding the PICC line and she states was placed at [facility]. Has a secondary complaint she states she is feeling tired and wants to be checked for a UTI. She denies any fever chills or sweats denies any nausea or vomiting and denies any pain anywhere.  Past Medical History  Diagnosis Date  . Bowel obstruction several  . Cancer bladder  . Thyroid disease   . Acid reflux     Past Surgical History  Procedure Date  . Back surgery   . Hernia repair   . Abdominal surgery   . Cholecystectomy     History reviewed. No pertinent family history.  History  Substance Use Topics  . Smoking status: Former Games developer  . Smokeless tobacco: Not on file  . Alcohol Use: No    OB History    Grav Para Term Preterm Abortions TAB SAB Ect Mult Living                  Review of Systems  All other systems reviewed and are negative.    Allergies  Codeine and Sulfa antibiotics  Home Medications   Current Outpatient Rx  Name Route Sig Dispense Refill  . DOCUSATE SODIUM 100 MG PO CAPS Oral Take 100 mg by mouth daily.      Marland Kitchen LEVOTHYROXINE SODIUM 75 MCG PO TABS Oral Take 75 mcg by mouth daily.      Marland Kitchen MAGNESIUM 30 MG PO TABS Oral Take 30 mg by mouth  4 (four) times daily.      Marland Kitchen POLYETHYLENE GLYCOL 3350 PO POWD Oral Take 17 g by mouth daily.      . SENNOSIDES 8.6 MG PO TABS Oral Take 1 tablet by mouth daily.      . TPN NICU Intravenous Inject into the vein continuous. For 12 hours daily from 8:30pm or 9:30pm to 8:30am to 9:30am     . DEXLANSOPRAZOLE 60 MG PO CPDR Oral Take 60 mg by mouth daily.        BP 118/87  Pulse 99  Temp(Src) 98.5 F (36.9 C) (Oral)  Resp 18  Ht 5\' 6"  (1.676 m)  Wt 154 lb (69.854 kg)  BMI 24.86 kg/m2  SpO2 100%  Physical Exam  Nursing note and vitals reviewed. 60 year old female resting comfortably in no acute distress. She is well-developed well-nourished. Vital signs are normal. Pupils equal and reactive extraocular movements are full. Head is normocephalic atraumatic. Skin is without rashes. Neck is supple without adenopathy or JVD. Lungs are clear. Heart regular rate and rhythm without murmur. Abdomen is soft flat nontender. Chest Mediport is present in the right parasternal area. Extremities PICC line is present in the left antecubital area and the insertion site appears  clean. There is no cyanosis or edema. Full range of motion is present all joints. Neurologic mental status is normal cranial nerves are intact to neuromotor sensory deficits deep tendon reflexes are symmetric. Psychiatric no other maladies of mood or thought content.  ED Course  Procedures (including critical care time) UTI as noted on urinalysis. Specimen sent for culture and she was started on oral Macrodantin. Labs Reviewed  URINALYSIS, ROUTINE W REFLEX MICROSCOPIC   No results found.   No diagnosis found.    MDM  IV team is not in the hospital currently and they are the ones who would use tPA to try and clear the PICC line. We'll get chest x-ray to make sure it is still in the correct location and obtain UA to make sure she does not have a U. UTI but she will need to return tomorrow to have the IV team evaluate her PICC  line.   Labs and x-rays are reviewed. Images are viewed by me. Results for orders placed during the hospital encounter of 03/28/11  URINALYSIS, ROUTINE W REFLEX MICROSCOPIC      Component Value Range   Color, Urine YELLOW  YELLOW    Appearance TURBID (*) CLEAR    Specific Gravity, Urine 1.020  1.005 - 1.030    pH 6.0  5.0 - 8.0    Glucose, UA NEGATIVE  NEGATIVE (mg/dL)   Hgb urine dipstick MODERATE (*) NEGATIVE    Bilirubin Urine NEGATIVE  NEGATIVE    Ketones, ur NEGATIVE  NEGATIVE (mg/dL)   Protein, ur 30 (*) NEGATIVE (mg/dL)   Urobilinogen, UA 0.2  0.0 - 1.0 (mg/dL)   Nitrite POSITIVE (*) NEGATIVE    Leukocytes, UA LARGE (*) NEGATIVE   URINE MICROSCOPIC-ADD ON      Component Value Range   Squamous Epithelial / LPF RARE  RARE    WBC, UA 21-50  <3 (WBC/hpf)   RBC / HPF 11-20  <3 (RBC/hpf)   Bacteria, UA MANY (*) RARE    Dg Chest 1 View  03/28/2011  *RADIOLOGY REPORT*  Clinical Data: PICC placement  CHEST - 1 VIEW  Comparison: 08/16/2009  Findings: Left arm PICC has its tip directed towards the lateral wall of the proximal SVC.  Right IJ PowerPort tip is in the distal SVC. No pneumothorax.  Lungs clear.  Heart size normal.  No effusion.  Regional bones unremarkable.  IMPRESSION:  1.  Central catheter placement as above. 2.  No acute cardiopulmonary disease.  Original Report Authenticated By: Osa Craver, M.D.         Dione Booze, MD 03/28/11 2202

## 2011-03-28 NOTE — ED Notes (Signed)
Pt states the home health RN came out Tuesday and tried to draw blood and was unable and she came out again today and was unable to draw any blood; Pt receives TPN every night

## 2011-03-29 ENCOUNTER — Encounter (HOSPITAL_COMMUNITY): Payer: Self-pay | Admitting: *Deleted

## 2011-03-29 ENCOUNTER — Emergency Department (HOSPITAL_COMMUNITY)
Admission: EM | Admit: 2011-03-29 | Discharge: 2011-03-29 | Disposition: A | Discharge status: Home / Self Care | Payer: Medicare Other | Attending: Emergency Medicine | Admitting: Emergency Medicine

## 2011-03-29 DIAGNOSIS — Z859 Personal history of malignant neoplasm, unspecified: Secondary | ICD-10-CM | POA: Insufficient documentation

## 2011-03-29 DIAGNOSIS — Z452 Encounter for adjustment and management of vascular access device: Secondary | ICD-10-CM

## 2011-03-29 DIAGNOSIS — K219 Gastro-esophageal reflux disease without esophagitis: Secondary | ICD-10-CM | POA: Insufficient documentation

## 2011-03-29 DIAGNOSIS — L299 Pruritus, unspecified: Secondary | ICD-10-CM | POA: Insufficient documentation

## 2011-03-29 DIAGNOSIS — E079 Disorder of thyroid, unspecified: Secondary | ICD-10-CM | POA: Insufficient documentation

## 2011-03-29 DIAGNOSIS — T7840XA Allergy, unspecified, initial encounter: Secondary | ICD-10-CM

## 2011-03-29 DIAGNOSIS — T3795XA Adverse effect of unspecified systemic anti-infective and antiparasitic, initial encounter: Secondary | ICD-10-CM | POA: Insufficient documentation

## 2011-03-29 MED ORDER — SODIUM CHLORIDE 0.9 % IJ SOLN
10.0000 mL | Freq: Two times a day (BID) | INTRAMUSCULAR | Status: DC
  Filled 2011-03-29 (×2): qty 20

## 2011-03-29 MED ORDER — CIPROFLOXACIN HCL 500 MG PO TABS: 500.0000 mg | ORAL_TABLET | Freq: Two times a day (BID) | ORAL | Status: AC

## 2011-03-29 MED ORDER — ALTEPLASE 2 MG IJ SOLR
2.0000 mg | Freq: Once | INTRAMUSCULAR | Status: DC
  Filled 2011-03-29: qty 2

## 2011-03-29 MED ORDER — SODIUM CHLORIDE 0.9 % IJ SOLN
10.0000 mL | INTRAMUSCULAR | Status: DC | PRN
  Filled 2011-03-29: qty 20

## 2011-03-29 NOTE — ED Provider Notes (Signed)
History   Scribed for No att. providers found, the patient was seen in room APOTF/NONE. This chart was scribed by Clarita Crane. This patient's care was started at 12:49PM.   CSN: [id number] Arrival date & time: 03/29/2011 11:27 AM  Chief Complaint  Patient presents with  . Picc line clotted    HPI [name] is a 60 y.o. female who presents to the Emergency Department complaining of clotted PICC line onset yesterday and persistent since with no associated symptoms. Patient reports her home nurse was unable to draw blood from PICC line yesterday and then again today which has led her to believe the line is clotted at this time. Patient denies previous experience of similar incident. Additionally, patient c/o diffuse pruritus onset last night and persistent since following taking MacroBid which she was prescribed yesterday after evaluation in ED by Dr. Preston Fleeting and diagnosed with a UTI.   HPI ELEMENTS: Onset: yesterday Duration: persistent since onset   Context:  as above  Associated symptoms: No associated symptoms. Additional c/o diffuse pruritus.     PAST MEDICAL HISTORY:  Past Medical History  Diagnosis Date  . Bowel obstruction several  . Cancer bladder  . Thyroid disease   . Acid reflux     PAST SURGICAL HISTORY:  Past Surgical History  Procedure Date  . Back surgery   . Hernia repair   . Abdominal surgery   . Cholecystectomy     FAMILY HISTORY:  History reviewed. No pertinent family history.   SOCIAL HISTORY: History   Social History  . Marital Status: Widowed    Spouse Name: N/A    Number of Children: N/A  . Years of Education: N/A   Social History Main Topics  . Smoking status: Former Games developer  . Smokeless tobacco: None  . Alcohol Use: No  . Drug Use: No  . Sexually Active:    Other Topics Concern  . None   Social History Narrative  . None     Review of Systems 10 Systems reviewed and are negative for acute change except as noted in the  HPI.  Allergies  Codeine; Nitrofuran derivatives; and Sulfa antibiotics  Home Medications   Current Outpatient Rx  Name Route Sig Dispense Refill  . DEXLANSOPRAZOLE 60 MG PO CPDR Oral Take 60 mg by mouth daily.      Marland Kitchen DOCUSATE SODIUM 100 MG PO CAPS Oral Take 100 mg by mouth daily.      Marland Kitchen LEVOTHYROXINE SODIUM 75 MCG PO TABS Oral Take 75 mcg by mouth daily.      Marland Kitchen MAGNESIUM 30 MG PO TABS Oral Take 60 mg by mouth 2 (two) times daily.     . OXYCODONE HCL 5 MG PO TABS Oral Take 10 mg by mouth every 4 (four) hours as needed. For pain     . POLYETHYLENE GLYCOL 3350 PO POWD Oral Take 17 g by mouth daily.      . SENNOSIDES 8.6 MG PO TABS Oral Take 1 tablet by mouth daily.      . TPN NICU Intravenous Inject into the vein continuous. For 12 hours daily from 8:30pm or 9:30pm to 8:30am to 9:30am Only Monday-fridays only 5 days weekly, clear liquids on Saturday and sunday    . CIPROFLOXACIN HCL 500 MG PO TABS Oral Take 1 tablet (500 mg total) by mouth every 12 (twelve) hours. 14 tablet 0  . NITROFURANTOIN MONOHYD MACRO 100 MG PO CAPS Oral Take 1 capsule (100 mg total) by mouth 2 (  two) times daily. 14 capsule 0    BP 120/86  Pulse 94  Temp(Src) 97.9 F (36.6 C) (Oral)  Resp 20  Ht 5\' 6"  (1.676 m)  Wt 154 lb (69.854 kg)  BMI 24.86 kg/m2  SpO2 99%  Physical Exam  Constitutional: She is oriented to person, place, and time. She appears well-developed and well-nourished.  HENT:  Head: Normocephalic and atraumatic.  Eyes: Conjunctivae are normal. Pupils are equal, round, and reactive to light.  Neck: Neck supple.  Cardiovascular: Normal rate and regular rhythm.  Exam reveals no gallop and no friction rub.   No murmur heard. Pulmonary/Chest: Effort normal and breath sounds normal. She has no wheezes.  Abdominal: Soft. Bowel sounds are normal. She exhibits no distension. There is no tenderness.  Musculoskeletal: Normal range of motion. She exhibits no edema.       She has a PICC line on the inner  aspect of her left upper arm.    Neurological: She is alert and oriented to person, place, and time. No sensory deficit.  Skin: Skin is warm and dry.  Psychiatric: She has a normal mood and affect. Her behavior is normal.    ED Course  Procedures MDM   OTHER DATA REVIEWED: Nursing notes, vital signs, and past medical records reviewed. Lab results reviewed and considered Imaging results reviewed and considered  DIAGNOSTIC STUDIES: Oxygen Saturation is 100% on room air, normal by my interpretation.    LABS / RADIOLOGY: Results for orders placed during the hospital encounter of 03/28/11  URINALYSIS, ROUTINE W REFLEX MICROSCOPIC      Component Value Range   Color, Urine YELLOW  YELLOW    Appearance TURBID (*) CLEAR    Specific Gravity, Urine 1.020  1.005 - 1.030    pH 6.0  5.0 - 8.0    Glucose, UA NEGATIVE  NEGATIVE (mg/dL)   Hgb urine dipstick MODERATE (*) NEGATIVE    Bilirubin Urine NEGATIVE  NEGATIVE    Ketones, ur NEGATIVE  NEGATIVE (mg/dL)   Protein, ur 30 (*) NEGATIVE (mg/dL)   Urobilinogen, UA 0.2  0.0 - 1.0 (mg/dL)   Nitrite POSITIVE (*) NEGATIVE    Leukocytes, UA LARGE (*) NEGATIVE   URINE MICROSCOPIC-ADD ON      Component Value Range   Squamous Epithelial / LPF RARE  RARE    WBC, UA 21-50  <3 (WBC/hpf)   RBC / HPF 11-20  <3 (RBC/hpf)   Bacteria, UA MANY (*) RARE    Dg Chest 1 View  03/28/2011  *RADIOLOGY REPORT*  Clinical Data: PICC placement  CHEST - 1 VIEW  Comparison: 08/16/2009  Findings: Left arm PICC has its tip directed towards the lateral wall of the proximal SVC.  Right IJ PowerPort tip is in the distal SVC. No pneumothorax.  Lungs clear.  Heart size normal.  No effusion.  Regional bones unremarkable.  IMPRESSION:  1.  Central catheter placement as above. 2.  No acute cardiopulmonary disease.  Original Report Authenticated By: Osa Craver, M.D.    PROCEDURES:  ED COURSE / COORDINATION OF CARE: IV RN checked pt's PICC line.  It will infuse but  it cannot be aspirated.  She will need TPA to open it up.  Orders written for PICC line RN to do this. Also, she had itching with Macrobid.  Will stop Macrobid and place her on Cipro for her UTI.         PLAN: The patient is to return the emergency department if there  is any worsening of symptoms. I have reviewed the discharge instructions with the patient/family   DIAGNOSIS: 1. PIC line (peripherally inserted central catheter) flush   2. Allergic drug reaction      MEDICATIONS GIVEN IN THE E.D.  Medications  oxyCODONE (OXY IR/ROXICODONE) 5 MG immediate release tablet (not administered)  ciprofloxacin (CIPRO) 500 MG tablet (not administered)      I personally performed the services described in this documentation, which was scribed in my presence. The recorded information has been reviewed and considered. Osvaldo Human, M.D.        Carleene Cooper III, MD 03/30/11 [phone]

## 2011-03-29 NOTE — ED Notes (Addendum)
Worthy Rancher, RN returned and back in to pt's bedside to attempt to free line of any potential clot.  Pt. Tolerating well.  Clot apparently dissolved as line is patent and has positive blood return--irrigated with 10 cc saline.

## 2011-03-29 NOTE — ED Notes (Signed)
Patient comfortable and talking on the telephone.

## 2011-03-29 NOTE — ED Notes (Signed)
Called PICC line dept---was advised nurse was in a procedure but, will come here as soon as she is done.  Pt. Advised.

## 2011-03-29 NOTE — ED Notes (Signed)
Pt. Discharged---she states she usually flushes the PICC line at home( after she flushes with 10 ml of saline) with Heparin--but, she is unsure how many cc's she uses--does not have it with her or her directions--She advises she will instill the Heparin when she gets home this p.m.

## 2011-03-29 NOTE — ED Notes (Signed)
Pt here to have PICC line flushed, Home health RN unable to draw blood off line; pt was told to come back today when someone would be available

## 2011-03-29 NOTE — ED Notes (Signed)
Per Worthy Rancher, RN--she is going to let the Activase set for 20-30 minutes and try again to obtain blood return.

## 2011-03-29 NOTE — ED Notes (Signed)
No Change in pt's status---awaiting arrival of 2nd PICC line nurse to flush line with Activase.

## 2011-03-29 NOTE — ED Notes (Signed)
No change in stauts.  She is sleeping on carrier--awakens with verbal.

## 2011-03-29 NOTE — ED Notes (Signed)
Snack given per request.  No change in status.

## 2011-03-29 NOTE — ED Notes (Signed)
PICC line nurse here and in to see pt.

## 2011-03-29 NOTE — ED Notes (Addendum)
Worthy Rancher, RN here and in to assess/trt PICC line for obstruction.  She instilled 2.2 cc Cathflo Activase into PICC line--will let Activase set for 30-45 minutes

## 2011-03-29 NOTE — ED Notes (Signed)
Pt. States her home health nurse told her to come in because she was unable to get blood return on her PICC line--The line was placed at [facility] and she has been getting TPN thru the line.  No redness, swelling or drainage around insertion site. Attempted to flush line using aseptic technique---flushes with ease but was unable to get blood return---did not force line at all----spoke with MD and he is agreeable to requesting the PICC line nurses come down to evaluate.  I called and someone will be coming down to assess.  Pt. Kept advised of status.

## 2011-04-01 LAB — DIFFERENTIAL
Basophils Absolute: 0
Basophils Relative: 0
Eosinophils Absolute: 0.3
Eosinophils Relative: 3
Lymphocytes Relative: 36
Lymphs Abs: 3.3
Monocytes Absolute: 0.6
Monocytes Relative: 7
Neutro Abs: 5
Neutrophils Relative %: 54

## 2011-04-01 LAB — POCT I-STAT, CHEM 8
BUN: 10
Calcium, Ion: 1.29
Chloride: 106
Creatinine, Ser: 0.8
Glucose, Bld: 114 — ABNORMAL HIGH
HCT: 38
Hemoglobin: 12.9
Potassium: 4
Sodium: 142
TCO2: 25

## 2011-04-01 LAB — CBC
HCT: 37.1
Hemoglobin: 12.6
MCHC: 34.1
MCV: 77.7 — ABNORMAL LOW
Platelets: 339
RBC: 4.77
RDW: 15.4
WBC: 9.3

## 2011-04-01 LAB — COMPREHENSIVE METABOLIC PANEL
ALT: 13
AST: 15
Albumin: 3.5
Alkaline Phosphatase: 97
BUN: 9
CO2: 26
Calcium: 9.2
Chloride: 110
Creatinine, Ser: 0.77
GFR calc Af Amer: >60
GFR calc non Af Amer: >60
Glucose, Bld: 124 — ABNORMAL HIGH
Potassium: 3.9
Sodium: 139
Total Bilirubin: 0.7
Total Protein: 6.6

## 2011-04-02 LAB — BASIC METABOLIC PANEL
Chloride: 105
Creatinine, Ser: 0.98
GFR calc Af Amer: >60

## 2011-04-02 LAB — CBC
MCV: 77.6 — ABNORMAL LOW
RBC: 4.73
WBC: 11.3 — ABNORMAL HIGH

## 2011-04-02 LAB — DIFFERENTIAL
Eosinophils Absolute: 0.3
Lymphocytes Relative: 30
Lymphs Abs: 3.4
Monocytes Relative: 8
Neutro Abs: 6.5
Neutrophils Relative %: 58

## 2011-04-02 LAB — POCT CARDIAC MARKERS
CKMB, poc: <1 — ABNORMAL LOW
Troponin i, poc: <0.05

## 2011-04-04 ENCOUNTER — Emergency Department (HOSPITAL_COMMUNITY)
Admission: EM | Admit: 2011-04-04 | Discharge: 2011-04-04 | Disposition: A | Discharge status: Home / Self Care | Payer: Medicare Other | Attending: Emergency Medicine | Admitting: Emergency Medicine

## 2011-04-04 ENCOUNTER — Emergency Department (HOSPITAL_COMMUNITY): Payer: Medicare Other

## 2011-04-04 ENCOUNTER — Encounter (HOSPITAL_COMMUNITY): Payer: Self-pay | Admitting: Emergency Medicine

## 2011-04-04 DIAGNOSIS — T82898A Other specified complication of vascular prosthetic devices, implants and grafts, initial encounter: Secondary | ICD-10-CM | POA: Insufficient documentation

## 2011-04-04 DIAGNOSIS — Z452 Encounter for adjustment and management of vascular access device: Secondary | ICD-10-CM

## 2011-04-04 DIAGNOSIS — Y849 Medical procedure, unspecified as the cause of abnormal reaction of the patient, or of later complication, without mention of misadventure at the time of the procedure: Secondary | ICD-10-CM | POA: Insufficient documentation

## 2011-04-04 DIAGNOSIS — Z79899 Other long term (current) drug therapy: Secondary | ICD-10-CM | POA: Insufficient documentation

## 2011-04-04 NOTE — ED Notes (Signed)
Pt has PICC line to l upper arm due to bowel obstruction. Receiving TPN through it. Was unable to get blood return from it today and was told by home health nurse not to use it if unable to get blood return prior to giving self TPN. Nad. Denies pain. Site intact. nad

## 2011-04-04 NOTE — ED Notes (Signed)
Spoke with Claudia Desanctis, PICC RN and arranged for pt to come register at 0800 tomorrow morning and will have PICC placed at 0830.  Notified Dr. Manus Gunning and patient.

## 2011-04-04 NOTE — ED Notes (Signed)
Pt states is here secondary to no blood return from  Previously inserted PICC Line when home health infusion RN arrived to transfuse TPN. Pt reports having bowel obstruction from multiple gastric surgery.  Was started on TPN  August 15,2012  And TPN infuses 12 hours on, 12 hrs off.  Pt reports ED visit here Thursday and Friday for same reason. Pt was seen at [facility] on Tuesday to have heparin infused into line. Pt reports no discomfort or distress at this time.

## 2011-04-04 NOTE — ED Provider Notes (Signed)
History    Scribed for Glynn Octave, MD, the patient was seen in room APA01/APA01. This chart was scribed by Katha Cabal. This patient's care was started at 15:32.     CSN: [id number] Arrival date & time: 04/04/2011  3:20 PM  Chief Complaint  Patient presents with  . Vascular Access Problem    (Consider location/radiation/quality/duration/timing/severity/associated sxs/prior treatment) HPI  [name] is a 60 y.o. female who presents to the Emergency Department complaining of left upper extremity picc line clog for TPN.  Picc line has been clogged 3x since having picc line placed for bowel obstruction in August.  Picc line has not been drawing for past 12 hours.  Denies fever, SOB, chest pain, abdominal pain, diarrhea, vomiting, site pain and drainage.  Patient is being treated at [facility] for bowel obstruction.  Patient had line flushed 2 days ago at [facility] and 6 days ago in ED.  Patient has had 8 abdominal surgeries.      PAST MEDICAL HISTORY:  Past Medical History  Diagnosis Date  . Bowel obstruction several  . Cancer bladder  . Thyroid disease   . Acid reflux     PAST SURGICAL HISTORY:  Past Surgical History  Procedure Date  . Back surgery   . Hernia repair   . Abdominal surgery   . Cholecystectomy     FAMILY HISTORY:  History reviewed. No pertinent family history.   SOCIAL HISTORY: History   Social History  . Marital Status: Widowed    Spouse Name: N/A    Number of Children: N/A  . Years of Education: N/A   Social History Main Topics  . Smoking status: Former Games developer  . Smokeless tobacco: None  . Alcohol Use: No  . Drug Use: No  . Sexually Active:    Other Topics Concern  . None   Social History Narrative  . None    Review of Systems 10 Systems reviewed and are negative for acute change except as noted in the HPI.  Allergies  Codeine; Nitrofuran derivatives; and Sulfa antibiotics  Home Medications   Current Outpatient Rx  Name Route  Sig Dispense Refill  . CIPROFLOXACIN HCL 500 MG PO TABS Oral Take 1 tablet (500 mg total) by mouth every 12 (twelve) hours. 14 tablet 0  . DEXLANSOPRAZOLE 60 MG PO CPDR Oral Take 60 mg by mouth daily.      Marland Kitchen DOCUSATE SODIUM 100 MG PO CAPS Oral Take 100 mg by mouth daily.      Marland Kitchen LEVOTHYROXINE SODIUM 75 MCG PO TABS Oral Take 75 mcg by mouth daily.      Marland Kitchen MAGNESIUM 30 MG PO TABS Oral Take 60 mg by mouth 2 (two) times daily.     . OXYCODONE HCL 5 MG PO TABS Oral Take 10 mg by mouth every 4 (four) hours as needed. For pain     . POLYETHYLENE GLYCOL 3350 PO POWD Oral Take 17 g by mouth daily.      . SENNOSIDES 8.6 MG PO TABS Oral Take 1 tablet by mouth daily.      . TPN NICU Intravenous Inject into the vein continuous. For 12 hours daily from 8:30pm or 9:30pm to 8:30am to 9:30am Only Monday-fridays only 5 days weekly, clear liquids on Saturday and sunday    . NITROFURANTOIN MONOHYD MACRO 100 MG PO CAPS Oral Take 1 capsule (100 mg total) by mouth 2 (two) times daily. 14 capsule 0    BP 135/87  Pulse 84  Temp(Src)  97.8 F (36.6 C) (Oral)  Resp 18  SpO2 100%  Physical Exam  Constitutional: She is oriented to person, place, and time. She appears well-developed and well-nourished. No distress.  HENT:  Head: Normocephalic and atraumatic.  Eyes: Conjunctivae and EOM are normal. No scleral icterus.  Neck: Normal range of motion. Neck supple.  Cardiovascular: Normal rate and regular rhythm.   No murmur heard. Pulmonary/Chest: Effort normal and breath sounds normal. No respiratory distress. She has no wheezes. She has no rales. She exhibits no tenderness.  Abdominal: Soft. There is no tenderness. There is no rebound and no guarding.  Musculoskeletal: Normal range of motion. She exhibits no edema and no tenderness.       Left upper extremity picc line that is clean, dry and intact with no bleeding and no drainage  Neurological: She is alert and oriented to person, place, and time. Coordination  normal.  Skin: No rash noted. No erythema.  Psychiatric: She has a normal mood and affect. Her behavior is normal.    ED Course  Procedures (including critical care time)  Labs Reviewed - No data to display OTHER DATA REVIEWED: Nursing notes, vital signs, and past medical records reviewed.   DIAGNOSTIC STUDIES: Oxygen Saturation is 100% on room air, normal by my interpretation.      LABS / RADIOLOGY:   Dg Chest 1 View  04/04/2011  *RADIOLOGY REPORT*  Clinical Data: PICC placement.  History of bladder cancer.  CHEST - 1 VIEW  Comparison: Chest radiograph 03/28/2011  Findings: Stable heart and mediastinal contours.  Normal pulmonary vascularity.  Lungs are clear.  Left upper extremity PICC terminates in the proximal superior vena cava, with its distal tip directed toward the lateral wall of the superior vena cava.  Right IJ power port terminates near the junction of the distal superior vena cava right atrium.  No visible pleural effusion.  Negative for pneumothorax.  Bones are unremarkable.  IMPRESSION: Left upper extremity PICC and right IJ power port as described above.  No acute cardiopulmonary disease.  Original Report Authenticated By: Britta Mccreedy, M.D.      ED COURSE / COORDINATION OF CARE: 3:40 PM  Physical exam complete.  Will contact picc line RN.   4:06 PM  Will have patient return tomorrow at 8 AM for picc line to be flushed.  Picc Line RN gone for the day.    Orders Placed This Encounter  Procedures  . DG Chest 1 View          MDM   MDM: Malfunctioning PICC line is unable to draw blood but was able to flush. Unfortunately PICC nurses left of the day and is unable to evaluate line at this time. Patient scheduled for evaluation at 8a.m. tomorrow morning.   IMPRESSION: Diagnoses that have been ruled out:  Diagnoses that are still under consideration:  Final diagnoses:     MEDICATIONS GIVEN IN THE E.D. Scheduled Meds:   Continuous Infusions:       DISCHARGE MEDICATIONS: New Prescriptions   No medications on file    I personally performed the services described in this documentation, which was scribed in my presence.  The recorded information has been reviewed and considered.         Glynn Octave, MD 04/04/11 1729

## 2011-04-05 ENCOUNTER — Ambulatory Visit (HOSPITAL_COMMUNITY)
Admit: 2011-04-05 | Discharge: 2011-04-05 | Disposition: A | Discharge status: Home / Self Care | Payer: Medicare Other | Source: Ambulatory Visit | Attending: Emergency Medicine | Admitting: Emergency Medicine

## 2011-04-05 DIAGNOSIS — N390 Urinary tract infection, site not specified: Secondary | ICD-10-CM | POA: Insufficient documentation

## 2011-04-05 DIAGNOSIS — T82898A Other specified complication of vascular prosthetic devices, implants and grafts, initial encounter: Secondary | ICD-10-CM | POA: Insufficient documentation

## 2011-04-05 DIAGNOSIS — Y849 Medical procedure, unspecified as the cause of abnormal reaction of the patient, or of later complication, without mention of misadventure at the time of the procedure: Secondary | ICD-10-CM | POA: Insufficient documentation

## 2011-04-05 MED ORDER — ALTEPLASE 100 MG IV SOLR
2.0000 mg | Freq: Once | INTRAVENOUS | Status: AC
  Administered 2011-04-05: 2 mg
  Filled 2011-04-05: qty 2

## 2011-04-05 MED ORDER — ALTEPLASE 100 MG IV SOLR
2.0000 mg | Freq: Once | INTRAVENOUS | Status: DC
  Filled 2011-04-05: qty 2

## 2011-04-05 NOTE — Progress Notes (Signed)
Line flushed then withdrew 5cc of blood without difficulty. Flushed with 5 cc fluid  And clamped.

## 2011-04-05 NOTE — Progress Notes (Signed)
TPA infused per protocol. Patient to wait at least an hour to allow med to work.

## 2011-04-05 NOTE — Progress Notes (Signed)
8:30 patient here with order to declot picc line.

## 2011-04-05 NOTE — Progress Notes (Signed)
Notified [facility] regarding picc.

## 2011-04-10 NOTE — Progress Notes (Signed)
Patient picc line flushed the withdrew 5cc blood return. Flushed line with 10cc saline flush. Clamped and patient d/c to  Home. Patient to contact [facility] when reaching home.

## 2011-04-10 NOTE — Progress Notes (Signed)
TPA injected without difficulty. Will wait 1 hour to allow time to declot.

## 2011-04-10 NOTE — Progress Notes (Signed)
Spoke with erica at [facility]. Told her that line was declotted and flushed. Ok to use.

## 2011-04-10 NOTE — Progress Notes (Signed)
Patient here for picc line TPA per emergency room doctor.

## 2011-04-15 LAB — BASIC METABOLIC PANEL
GFR calc Af Amer: >60
GFR calc non Af Amer: >60
Potassium: 4
Sodium: 136

## 2011-04-15 LAB — TROPONIN I: Troponin I: 0.01

## 2011-04-15 LAB — CBC
HCT: 36.1
Hemoglobin: 12.4
WBC: 8.8

## 2011-04-15 LAB — URINE CULTURE: Special Requests: POSITIVE

## 2011-04-15 LAB — CK TOTAL AND CKMB (NOT AT ARMC): Total CK: 57

## 2011-04-15 LAB — APTT: aPTT: 26

## 2011-04-15 LAB — PROTIME-INR
INR: 1
Prothrombin Time: 13.4

## 2011-05-10 ENCOUNTER — Emergency Department (HOSPITAL_COMMUNITY)
Admission: EM | Admit: 2011-05-10 | Discharge: 2011-05-10 | Disposition: A | Discharge status: Home / Self Care | Payer: Medicare Other | Attending: Emergency Medicine | Admitting: Emergency Medicine

## 2011-05-10 ENCOUNTER — Encounter (HOSPITAL_COMMUNITY): Payer: Self-pay | Admitting: *Deleted

## 2011-05-10 DIAGNOSIS — N390 Urinary tract infection, site not specified: Secondary | ICD-10-CM | POA: Insufficient documentation

## 2011-05-10 DIAGNOSIS — Z79899 Other long term (current) drug therapy: Secondary | ICD-10-CM | POA: Insufficient documentation

## 2011-05-10 DIAGNOSIS — R109 Unspecified abdominal pain: Secondary | ICD-10-CM | POA: Insufficient documentation

## 2011-05-10 DIAGNOSIS — R319 Hematuria, unspecified: Secondary | ICD-10-CM | POA: Insufficient documentation

## 2011-05-10 LAB — URINALYSIS, ROUTINE W REFLEX MICROSCOPIC
Glucose, UA: NEGATIVE mg/dL
Nitrite: POSITIVE — AB
Protein, ur: 30 mg/dL — AB
pH: 5.5 (ref 5.0–8.0)

## 2011-05-10 LAB — URINE CULTURE
Colony Count: >=100000
Culture  Setup Time: 201209290058

## 2011-05-10 LAB — URINE MICROSCOPIC-ADD ON

## 2011-05-10 MED ORDER — CEPHALEXIN 500 MG PO CAPS: ORAL_CAPSULE | ORAL | Status: DC

## 2011-05-10 MED ORDER — ONDANSETRON 8 MG PO TBDP
8.0000 mg | ORAL_TABLET | Freq: Once | ORAL | Status: AC
  Administered 2011-05-10: 8 mg via ORAL
  Filled 2011-05-10: qty 1

## 2011-05-10 MED ORDER — OXYCODONE-ACETAMINOPHEN 5-325 MG PO TABS
1.0000 | ORAL_TABLET | Freq: Once | ORAL | Status: AC
  Administered 2011-05-10: 1 via ORAL
  Filled 2011-05-10: qty 1

## 2011-05-10 MED ORDER — CEPHALEXIN 500 MG PO CAPS
500.0000 mg | ORAL_CAPSULE | Freq: Once | ORAL | Status: AC
Start: 2011-05-10 — End: 2011-05-10
  Administered 2011-05-10: 500 mg via ORAL
  Filled 2011-05-10: qty 1

## 2011-05-10 NOTE — ED Provider Notes (Signed)
Scribed for Charlene Gaskins, MD, the patient was seen in room APA10/APA10 . This chart was scribed by Ellie Lunch.   CSN: [id number] Arrival date & time: 05/10/2011  7:59 PM   First MD Initiated Contact with Patient 05/10/11 2000      Chief Complaint  Patient presents with  . Abdominal Pain  . Hematuria    Raising is Patient is a 60 y.o. female presenting with abdominal pain and hematuria. The history is provided by the patient and a relative. No language interpreter was used.  Abdominal Pain The primary symptoms of the illness include abdominal pain. The primary symptoms of the illness do not include fever or shortness of breath. The current episode started more than 2 days ago. The onset of the illness was gradual. The problem has been gradually worsening.  The abdominal pain began 2 days ago. The pain came on gradually. The abdominal pain has been gradually worsening since its onset. The abdominal pain is generalized. The abdominal pain does not radiate. The severity of the abdominal pain is 5/10. The abdominal pain is relieved by nothing. Exacerbated by: nothing.  The patient states that she believes she is currently not pregnant. The patient has not had a change in bowel habit. Risk factors for an acute abdominal problem include a history of abdominal surgery. Additional symptoms associated with the illness include hematuria. Symptoms associated with the illness do not include back pain.  Hematuria Associated symptoms include abdominal pain. Pertinent negatives include no fever.   Pt seen at 8:35 PM ROTHA [name] is a 60 y.o. female who presents to the Emergency Department complaining of generalized abdominal pain starting 3 days ago and becoming progressively worse. Pt has urostomy since 2010 ( bladder was removed b/c of cancer, not currently on chemo/radiation) and also reports some reddish urine for the past 3 days. Pt reports history of similar sx 6 weeks ago and was dx with  UTI. Pt treated with Cipro with improvement. Pt denies fever, cough, SOB, CP. Pt gets her blood work done weekly by PCP Don[facility]go and was told Wednesday, 2 days ago, that her blood work looked good.  Additionally Pt has been TPN since 01/2011 (has picc line in left arm) due to partial obstruction caused by scarring after multiple abdominal surgeries. Last abd surgery 2010. Pt is able to take some PO. Pt has urostomy/TPN managed at [facility].     Past Medical History  Diagnosis Date  . Bowel obstruction several  . Cancer bladder  . Thyroid disease   . Acid reflux     Past Surgical History  Procedure Date  . Back surgery   . Hernia repair   . Abdominal surgery   . Cholecystectomy     History reviewed. No pertinent family history.  History  Substance Use Topics  . Smoking status: Former Games developer  . Smokeless tobacco: Not on file  . Alcohol Use: No    Review of Systems  Constitutional: Negative for fever.  Respiratory: Negative for cough and shortness of breath.   Cardiovascular: Negative for chest pain.  Gastrointestinal: Positive for abdominal pain.  Genitourinary: Positive for hematuria.  Musculoskeletal: Negative for back pain.  All other systems reviewed and are negative.    Allergies  Codeine; Nitrofuran derivatives; and Sulfa antibiotics  Home Medications   Current Outpatient Rx  Name Route Sig Dispense Refill  . DEXLANSOPRAZOLE 60 MG PO CPDR Oral Take 60 mg by mouth daily.      Marland Kitchen DOCUSATE SODIUM  100 MG PO CAPS Oral Take 100 mg by mouth 2 (two) times daily.     Marland Kitchen LEVOTHYROXINE SODIUM 75 MCG PO TABS Oral Take 75 mcg by mouth daily.      Marland Kitchen MAGNESIUM 30 MG PO TABS Oral Take 60 mg by mouth 2 (two) times daily.     . OXYCODONE HCL 5 MG PO TABS Oral Take 10 mg by mouth every 4 (four) hours as needed. For pain     . POLYETHYLENE GLYCOL 3350 PO POWD Oral Take 17 g by mouth at bedtime.     . SENNOSIDES 8.6 MG PO TABS Oral Take 1 tablet by mouth 2 (two) times daily.     . TPN  NICU Intravenous Inject into the vein continuous. For 12 hours daily from 8:30pm or 8:30pm or 9:30am to 9:30am Only Sundays, Mondays, Wednesdays, and Fridays      BP 128/79  Pulse 75  Temp(Src) 97.6 F (36.4 C) (Oral)  Resp 16  Ht 5\' 6"  (1.676 m)  Wt 156 lb (70.761 kg)  BMI 25.18 kg/m2  SpO2 100%  Physical Exam CONSTITUTIONAL: Well developed/well nourished HEAD AND FACE: Normocephalic/atraumatic EYES: EOMI/PERRL ENMT: Mucous membrane are s moist NECK: supple no meningeal signs SPINE:entire spine nontender CV: S1/S2 noted, no murmurs/rubs/gallops noted LUNGS: Lungs are clear to auscultation bilaterally, no apparent distress ABDOMEN: soft, nontender, no rebound or guarding. Urostomy. Stoma appears pink. Midline surgical scar well healed.  GU:no cva tenderness NEURO: Pt is awake/alert, moves all extremitiesx4 EXTREMITIES: pulses normal, full ROM SKIN: warm, color normal PSYCH: no abnormalities of mood noted  ED Course  Procedures   OTHER DATA REVIEWED: Nursing notes, vital signs, and past medical records reviewed. APrevious records reviewed and considered All labs/vitals reviewed and considered    DIAGNOSTIC STUDIES: Oxygen Saturation is 100% on room air, normal by my interpretation.      Labs Reviewed  URINALYSIS, ROUTINE W REFLEX MICROSCOPIC  URINE CULTURE      MDM  Pt well appearing, no distress, reports "just like last time I had a UTI" and given she is symptomatic,  will tx for uti.  Denies cp/sob.  Abd exam unremarkable  I personally performed the services described in this documentation, which was scribed in my presence. The recorded information has been reviewed and considered.          Charlene Gaskins, MD 05/10/11 2224

## 2011-05-10 NOTE — ED Notes (Signed)
Dr. Wickline in prior to RN, see EDP assessment for further  

## 2011-05-10 NOTE — ED Notes (Signed)
Pt c/o abdominal pain and cramping and reddish urine since Tuesday. Pt does have a urostomy.

## 2011-05-15 LAB — URINE CULTURE
Colony Count: 75000
Culture  Setup Time: 201211101930

## 2011-05-16 NOTE — ED Notes (Signed)
+   Urine Patient treated with Keflex-sensitive to same-chart appended per protocol MD. 

## 2011-09-03 ENCOUNTER — Ambulatory Visit (INDEPENDENT_AMBULATORY_CARE_PROVIDER_SITE_OTHER): Payer: Medicare Other | Admitting: Family Medicine

## 2011-09-03 ENCOUNTER — Encounter: Payer: Self-pay | Admitting: Family Medicine

## 2011-09-03 VITALS — BP 118/84 | HR 88 | Resp 16 | Ht 65.5 in | Wt 164.0 lb

## 2011-09-03 DIAGNOSIS — R7989 Other specified abnormal findings of blood chemistry: Secondary | ICD-10-CM

## 2011-09-03 DIAGNOSIS — K56609 Unspecified intestinal obstruction, unspecified as to partial versus complete obstruction: Secondary | ICD-10-CM | POA: Insufficient documentation

## 2011-09-03 DIAGNOSIS — F17200 Nicotine dependence, unspecified, uncomplicated: Secondary | ICD-10-CM

## 2011-09-03 DIAGNOSIS — Z789 Other specified health status: Secondary | ICD-10-CM

## 2011-09-03 DIAGNOSIS — E039 Hypothyroidism, unspecified: Secondary | ICD-10-CM

## 2011-09-03 DIAGNOSIS — Z8551 Personal history of malignant neoplasm of bladder: Secondary | ICD-10-CM

## 2011-09-03 DIAGNOSIS — E785 Hyperlipidemia, unspecified: Secondary | ICD-10-CM

## 2011-09-03 DIAGNOSIS — M545 Low back pain, unspecified: Secondary | ICD-10-CM

## 2011-09-03 DIAGNOSIS — I10 Essential (primary) hypertension: Secondary | ICD-10-CM

## 2011-09-03 MED ORDER — OXYCODONE HCL 5 MG PO TABS: 5.0000 mg | ORAL_TABLET | Freq: Four times a day (QID) | ORAL | Status: DC | PRN

## 2011-09-03 NOTE — Assessment & Plan Note (Signed)
She has history of bowel obstruction and what sounds like a stricture. I will need to obtain records regarding her actual diagnosis. She is on TPN for nutrition.

## 2011-09-03 NOTE — Assessment & Plan Note (Signed)
Chronic low back pain and abdominal pain status post 2 surgeries for her back. She will be maintained on her when necessary oxycodone which she uses very rarely.

## 2011-09-03 NOTE — Assessment & Plan Note (Signed)
Patient is status post bladder cancer with removal of bladder. I will obtain records from her specialist at [facility] and [facility]

## 2011-09-03 NOTE — Assessment & Plan Note (Signed)
Patient is not on any medication since she lost significant weight.

## 2011-09-03 NOTE — Progress Notes (Signed)
  Subjective:    Patient ID: [name], female    DOB: 06-May-1951, 61 y.o.   MRN: [id number]  HPI Patient here to establish care. Previous primary care provider Dr. Kristen [facility]- surgeon AND management of TPN [facility]- bladder oncology  History of bladder cancer-  she is status post removal of her bladder and 20/10. She now has a colostomy for her urine output  Recurrent UTI- she is known to have recurrent UTI she notices this because she becomes fatigued and her urine changes color and she has an odor to it. She has been treated with multiple antibiotics in the past.  Hypertension/hyperlipidemia- she lost 100 pounds in and out of the hospital with her bladder cancer as well as problems with her bowels and inability to eat. She's no longer on medications for these.  Bowel obstruction/stricture- in 1997 patient had an injury to her intestines which was repaired. She's also had exploratory laparotomy surgery in 2001. She had hernia x2 in 2002. She also had a bowel repair in 2010. She now has a bowel obstruction/stricture. For the past few months when she does all of her food regurgitates back up. She was started on TPN by [facility]. She is able to tolerate liquid and small amounts of soft foods a few times a week.  Port-A-Cath- patient has a Port-A-Cath that needs to be flushed every 6 weeks. She will like to have this done locally.  Elevated liver function tests- patient was seen by her primary care provider 2 weeks ago for her routine labs. She was found to have elevated liver enzymes. She was set up to have an abdominal ultrasound however has decided to change primary care providers and will need this to be set up   Hypothyroidism-recent increase in medication to 100 mcg  PAP Smear- UTD Mammogram- over due  Chronic pain- she takes oxycodone approx 1 time a week, also uses tylenol  Daughter- Marshia Ly Engineer, building services)  Review of Systems  GEN- denies  fatigue, fever, weight loss,weakness, recent illness HEENT- denies eye drainage, change in vision, nasal discharge, CVS- denies chest pain, palpitations RESP- denies SOB, cough, wheeze ABD- denies N/V, change in stools, +abd pain GU- denies  hematuria,  MSK- + joint pain, muscle aches, injury Neuro- denies headache, dizziness, syncope, seizure activity       Objective:   Physical Exam GEN- NAD, alert and oriented x3 HEENT- PERRL, EOMI, non injected sclera, pink conjunctiva, MMM, oropharynx clear Neck- Supple, no thyromegaly CVS- RRR, no murmur RESP-CTAB ABD- distended, very loud bowels, non tender, ostomy in RLQ with urine bag attached d/c/i EXT- No edema Pulses- Radial, DP- 2+ Psych- not depressed or anxious appearing Left arm- Picc line Right chest wall- port-a- cath       Assessment & Plan:

## 2011-09-03 NOTE — Assessment & Plan Note (Signed)
Patient continues to smoke one to 2 cigarettes a week. She was encouraged to quit altogether.

## 2011-09-03 NOTE — Assessment & Plan Note (Signed)
No current medications

## 2011-09-03 NOTE — Assessment & Plan Note (Signed)
Her Synthroid was recently increased to 100 mcg. She will need repeat labs in 6 weeks the

## 2011-09-03 NOTE — Assessment & Plan Note (Addendum)
Recently noted to have elevated LFTs. She would need a right upper quadrant ultrasound to evaluate the liver region. She will need hepatitis labs if not done already Avoid hepatotoxic agents

## 2011-09-03 NOTE — Patient Instructions (Signed)
Continue your pain medication  I will set you up for a liver ultrasound Continue your current medications  Do not take any extra tylenol  Do not take the Papaya until I call you about this I will get your records from [place], [place] and Dr. Janna Arch I will check into getting your port flushed locally  F/U 4 weeks

## 2011-09-05 ENCOUNTER — Telehealth: Payer: Self-pay | Admitting: Family Medicine

## 2011-09-05 NOTE — Telephone Encounter (Signed)
Message copied by Salley Scarlet on Thu Sep 05, 2011  4:23 PM ------      Message from: Milinda Antis F      Created: Tue Sep 03, 2011  6:06 PM      Regarding: f/u on these             [facility] to see if they flush port-a cath            AP Onc for port a cath            Papaya extract

## 2011-09-05 NOTE — Telephone Encounter (Signed)
Spoke with pt [name] to flush port-a cath based on protocal every 4 weeks I reviewed Papaya, with her TPN formulations and possiblity of renal insufficiency with use of extract, I advised against this

## 2011-09-06 ENCOUNTER — Ambulatory Visit (HOSPITAL_COMMUNITY)
Admission: RE | Admit: 2011-09-06 | Discharge: 2011-09-06 | Disposition: A | Discharge status: Home / Self Care | Payer: Medicare Other | Source: Ambulatory Visit | Attending: Family Medicine | Admitting: Family Medicine

## 2011-09-06 ENCOUNTER — Other Ambulatory Visit (HOSPITAL_COMMUNITY): Payer: Medicare Other

## 2011-09-06 DIAGNOSIS — R7989 Other specified abnormal findings of blood chemistry: Secondary | ICD-10-CM

## 2011-09-06 DIAGNOSIS — R748 Abnormal levels of other serum enzymes: Secondary | ICD-10-CM | POA: Insufficient documentation

## 2011-09-06 DIAGNOSIS — R935 Abnormal findings on diagnostic imaging of other abdominal regions, including retroperitoneum: Secondary | ICD-10-CM | POA: Insufficient documentation

## 2011-09-27 ENCOUNTER — Inpatient Hospital Stay (HOSPITAL_COMMUNITY)
Admission: EM | Admit: 2011-09-27 | Discharge: 2011-09-30 | DRG: 389 | Disposition: A | Discharge status: Home / Self Care | Payer: Medicare Other | Attending: General Surgery | Admitting: General Surgery

## 2011-09-27 ENCOUNTER — Emergency Department (HOSPITAL_COMMUNITY): Payer: Medicare Other

## 2011-09-27 ENCOUNTER — Encounter (HOSPITAL_COMMUNITY): Payer: Self-pay

## 2011-09-27 DIAGNOSIS — Z8249 Family history of ischemic heart disease and other diseases of the circulatory system: Secondary | ICD-10-CM

## 2011-09-27 DIAGNOSIS — K56609 Unspecified intestinal obstruction, unspecified as to partial versus complete obstruction: Secondary | ICD-10-CM

## 2011-09-27 DIAGNOSIS — N390 Urinary tract infection, site not specified: Secondary | ICD-10-CM | POA: Diagnosis present

## 2011-09-27 DIAGNOSIS — Z8551 Personal history of malignant neoplasm of bladder: Secondary | ICD-10-CM

## 2011-09-27 DIAGNOSIS — M549 Dorsalgia, unspecified: Secondary | ICD-10-CM | POA: Diagnosis present

## 2011-09-27 DIAGNOSIS — K565 Intestinal adhesions [bands], unspecified as to partial versus complete obstruction: Principal | ICD-10-CM | POA: Diagnosis present

## 2011-09-27 LAB — URINALYSIS, ROUTINE W REFLEX MICROSCOPIC
Bilirubin Urine: NEGATIVE
Ketones, ur: NEGATIVE mg/dL
Nitrite: NEGATIVE
Protein, ur: 30 mg/dL — AB
Urobilinogen, UA: 0.2 mg/dL (ref 0.0–1.0)
pH: 8 (ref 5.0–8.0)

## 2011-09-27 LAB — COMPREHENSIVE METABOLIC PANEL
ALT: 10 U/L (ref 0–35)
CO2: 24 mEq/L (ref 19–32)
Calcium: 9.5 mg/dL (ref 8.4–10.5)
GFR calc Af Amer: 86 mL/min — ABNORMAL LOW (ref >90)
GFR calc non Af Amer: 74 mL/min — ABNORMAL LOW (ref >90)
Glucose, Bld: 107 mg/dL — ABNORMAL HIGH (ref 70–99)
Sodium: 135 mEq/L (ref 135–145)
Total Bilirubin: 0.5 mg/dL (ref 0.3–1.2)

## 2011-09-27 LAB — DIFFERENTIAL
Eosinophils Relative: 1 % (ref 0–5)
Lymphocytes Relative: 16 % (ref 12–46)
Lymphs Abs: 0.8 10*3/uL (ref 0.7–4.0)
Monocytes Absolute: 0.4 10*3/uL (ref 0.1–1.0)

## 2011-09-27 LAB — CBC
HCT: 34.1 % — ABNORMAL LOW (ref 36.0–46.0)
MCV: 77.3 fL — ABNORMAL LOW (ref 78.0–100.0)
Platelets: 266 10*3/uL (ref 150–400)
RBC: 4.41 MIL/uL (ref 3.87–5.11)
WBC: 5.3 10*3/uL (ref 4.0–10.5)

## 2011-09-27 MED ORDER — DEXTROSE 5 % IV SOLN
1.0000 g | Freq: Once | INTRAVENOUS | Status: AC
  Administered 2011-09-27: 1 g via INTRAVENOUS
  Filled 2011-09-27: qty 10

## 2011-09-27 MED ORDER — HYDROMORPHONE HCL PF 1 MG/ML IJ SOLN
1.0000 mg | INTRAMUSCULAR | Status: DC | PRN
Start: 2011-09-27 — End: 2011-09-30
  Administered 2011-09-27 – 2011-09-29 (×7): 1 mg via INTRAVENOUS
  Filled 2011-09-27 (×7): qty 1

## 2011-09-27 MED ORDER — HYDROMORPHONE HCL PF 1 MG/ML IJ SOLN
1.0000 mg | Freq: Once | INTRAMUSCULAR | Status: AC
  Administered 2011-09-27: 1 mg via INTRAVENOUS
  Filled 2011-09-27: qty 1

## 2011-09-27 MED ORDER — SODIUM CHLORIDE 0.9 % IV SOLN
INTRAVENOUS | Status: DC
  Administered 2011-09-27: 14:00:00 via INTRAVENOUS

## 2011-09-27 MED ORDER — IOHEXOL 300 MG/ML  SOLN
100.0000 mL | Freq: Once | INTRAMUSCULAR | Status: AC | PRN
  Administered 2011-09-27: 100 mL via INTRAVENOUS

## 2011-09-27 MED ORDER — LEVOTHYROXINE SODIUM 100 MCG IV SOLR
100.0000 ug | Freq: Every day | INTRAVENOUS | Status: DC
  Administered 2011-09-28 – 2011-09-30 (×3): 100 ug via INTRAVENOUS
  Filled 2011-09-27 (×5): qty 5

## 2011-09-27 MED ORDER — ENOXAPARIN SODIUM 40 MG/0.4ML ~~LOC~~ SOLN
40.0000 mg | SUBCUTANEOUS | Status: DC
  Administered 2011-09-27 – 2011-09-29 (×3): 40 mg via SUBCUTANEOUS
  Filled 2011-09-27 (×4): qty 0.4

## 2011-09-27 MED ORDER — IOHEXOL 300 MG/ML  SOLN
40.0000 mL | Freq: Once | INTRAMUSCULAR | Status: AC | PRN
  Administered 2011-09-27: 40 mL via ORAL

## 2011-09-27 MED ORDER — DEXTROSE 5 % IV SOLN
1.0000 g | INTRAVENOUS | Status: DC
  Administered 2011-09-28 – 2011-09-29 (×2): 1 g via INTRAVENOUS
  Filled 2011-09-27 (×3): qty 10

## 2011-09-27 MED ORDER — ONDANSETRON HCL 4 MG/2ML IJ SOLN
4.0000 mg | Freq: Once | INTRAMUSCULAR | Status: AC
  Administered 2011-09-27: 4 mg via INTRAVENOUS
  Filled 2011-09-27: qty 2

## 2011-09-27 MED ORDER — SODIUM CHLORIDE 0.9 % IV BOLUS (SEPSIS)
500.0000 mL | Freq: Once | INTRAVENOUS | Status: AC
  Administered 2011-09-27: 14:00:00 via INTRAVENOUS

## 2011-09-27 MED ORDER — ACETAMINOPHEN 325 MG PO TABS
650.0000 mg | ORAL_TABLET | Freq: Four times a day (QID) | ORAL | Status: DC | PRN
  Filled 2011-09-27: qty 2

## 2011-09-27 MED ORDER — ACETAMINOPHEN 650 MG RE SUPP: 650.0000 mg | Freq: Four times a day (QID) | RECTAL | Status: DC | PRN

## 2011-09-27 MED ORDER — KCL IN DEXTROSE-NACL 20-5-0.45 MEQ/L-%-% IV SOLN
INTRAVENOUS | Status: DC
  Administered 2011-09-27 – 2011-09-30 (×8): via INTRAVENOUS

## 2011-09-27 MED ORDER — PANTOPRAZOLE SODIUM 40 MG IV SOLR
40.0000 mg | Freq: Every day | INTRAVENOUS | Status: DC
  Administered 2011-09-27 – 2011-09-29 (×3): 40 mg via INTRAVENOUS
  Filled 2011-09-27 (×3): qty 40

## 2011-09-27 MED ORDER — ONDANSETRON HCL 4 MG/2ML IJ SOLN: 4.0000 mg | Freq: Four times a day (QID) | INTRAMUSCULAR | Status: DC | PRN

## 2011-09-27 NOTE — ED Notes (Signed)
Patient requesting something for pain before NG tube placement. Dilaudid given per Dr Lovell Sheehan orders.

## 2011-09-27 NOTE — ED Notes (Signed)
C/o abd pain for 1 week. Sharp pain per pt. Denies n/v. Last bm was today.

## 2011-09-27 NOTE — H&P (Signed)
[name] is an 61 y.o. female.   Chief Complaint: Nausea and vomiting HPI: Patient is a [age]-year-old black female with a history of bladder cancer, status post cystectomy with ileal conduit formation at [facility] in 2010 who has had a history of intermittent small bowel obstructions in the past that were treated with a nasogastric tube. She states over last 5 days, she is developed nausea and vomiting as well as abdominal distention. She denies any fever, chills.  Past Medical History  Diagnosis Date  . Bowel obstruction several  . Cancer bladder  . Thyroid disease   . Acid reflux   . Chronic back pain   . Hyperlipidemia   . Hypertension     Past Surgical History  Procedure Date  . Back surgery   . Hernia repair   . Abdominal surgery   . Cholecystectomy     Family History  Problem Relation Age of Onset  . Heart disease Mother     enlarged  . Hypertension Mother   . Diabetes Father   . Diabetes Sister   . Diabetes Maternal Grandmother    Social History:  reports that she has quit smoking. She does not have any smokeless tobacco history on file. She reports that she does not drink alcohol or use illicit drugs.  Allergies:  Allergies  Allergen Reactions  . Codeine Hives  . Nitrofuran Derivatives Itching  . Sulfa Antibiotics Hives    Medications Prior to Admission  Medication Dose Route Frequency Provider Last Rate Last Dose  . 0.9 %  sodium chloride infusion   Intravenous Continuous Donnetta Hutching, MD 125 mL/hr at 09/27/11 1331    . cefTRIAXone (ROCEPHIN) 1 g in dextrose 5 % 50 mL IVPB  1 g Intravenous Once Donnetta Hutching, MD   1 g at 09/27/11 1609  . HYDROmorphone (DILAUDID) injection 1 mg  1 mg Intravenous Once Donnetta Hutching, MD   1 mg at 09/27/11 1330  . HYDROmorphone (DILAUDID) injection 1 mg  1 mg Intravenous Once Donnetta Hutching, MD   1 mg at 09/27/11 1516  . iohexol (OMNIPAQUE) 300 MG/ML solution 100 mL  100 mL Intravenous Once PRN Medication Radiologist, MD   100  mL at 09/27/11 1542  . iohexol (OMNIPAQUE) 300 MG/ML solution 40 mL  40 mL Oral Once PRN Medication Radiologist, MD   40 mL at 09/27/11 1543  . ondansetron (ZOFRAN) injection 4 mg  4 mg Intravenous Once Donnetta Hutching, MD   4 mg at 09/27/11 1330  . sodium chloride 0.9 % bolus 500 mL  500 mL Intravenous Once Donnetta Hutching, MD       Medications Prior to Admission  Medication Sig Dispense Refill  . dexlansoprazole (DEXILANT) 60 MG capsule Take 60 mg by mouth daily.        Marland Kitchen docusate sodium (COLACE) 100 MG capsule Take 100 mg by mouth 2 (two) times daily.       Marland Kitchen oxyCODONE (OXY IR/ROXICODONE) 5 MG immediate release tablet Take 1 tablet (5 mg total) by mouth every 6 (six) hours as needed.  30 tablet  0  . polyethylene glycol powder (GLYCOLAX/MIRALAX) powder Take 17 g by mouth at bedtime.       . senna (SENOKOT) 8.6 MG tablet Take 1 tablet by mouth 2 (two) times daily.       . TPN NICU Inject into the vein continuous. For 12 hours daily from 8:30pm or 8:30pm or 9:30am to 9:30am Only Sundays, Mondays, Wednesdays, and Fridays  Results for orders placed during the hospital encounter of 09/27/11 (from the past 48 hour(s))  URINALYSIS, ROUTINE W REFLEX MICROSCOPIC     Status: Abnormal   Collection Time   09/27/11 11:48 AM      Component Value Range Comment   Color, Urine YELLOW  YELLOW     APPearance HAZY (*) CLEAR     Specific Gravity, Urine 1.020  1.005 - 1.030     pH 8.0  5.0 - 8.0     Glucose, UA NEGATIVE  NEGATIVE (mg/dL)    Hgb urine dipstick MODERATE (*) NEGATIVE     Bilirubin Urine NEGATIVE  NEGATIVE     Ketones, ur NEGATIVE  NEGATIVE (mg/dL)    Protein, ur 30 (*) NEGATIVE (mg/dL)    Urobilinogen, UA 0.2  0.0 - 1.0 (mg/dL)    Nitrite NEGATIVE  NEGATIVE     Leukocytes, UA MODERATE (*) NEGATIVE    URINE MICROSCOPIC-ADD ON     Status: Abnormal   Collection Time   09/27/11 11:48 AM      Component Value Range Comment   WBC, UA 21-50  <3 (WBC/hpf)    RBC / HPF 11-20  <3 (RBC/hpf)     Bacteria, UA FEW (*) RARE    CBC     Status: Abnormal   Collection Time   09/27/11  1:30 PM      Component Value Range Comment   WBC 5.3  4.0 - 10.5 (K/uL)    RBC 4.41  3.87 - 5.11 (MIL/uL)    Hemoglobin 12.2  12.0 - 15.0 (g/dL)    HCT 47.8 (*) 29.5 - 46.0 (%)    MCV 77.3 (*) 78.0 - 100.0 (fL)    MCH 27.7  26.0 - 34.0 (pg)    MCHC 35.8  30.0 - 36.0 (g/dL)    RDW 62.1  30.8 - 65.7 (%)    Platelets 266  150 - 400 (K/uL)   DIFFERENTIAL     Status: Normal   Collection Time   09/27/11  1:30 PM      Component Value Range Comment   Neutrophils Relative 75  43 - 77 (%)    Neutro Abs 4.0  1.7 - 7.7 (K/uL)    Lymphocytes Relative 16  12 - 46 (%)    Lymphs Abs 0.8  0.7 - 4.0 (K/uL)    Monocytes Relative 8  3 - 12 (%)    Monocytes Absolute 0.4  0.1 - 1.0 (K/uL)    Eosinophils Relative 1  0 - 5 (%)    Eosinophils Absolute 0.1  0.0 - 0.7 (K/uL)    Basophils Relative 0  0 - 1 (%)    Basophils Absolute 0.0  0.0 - 0.1 (K/uL)   COMPREHENSIVE METABOLIC PANEL     Status: Abnormal   Collection Time   09/27/11  1:30 PM      Component Value Range Comment   Sodium 135  135 - 145 (mEq/L)    Potassium 3.9  3.5 - 5.1 (mEq/L)    Chloride 101  96 - 112 (mEq/L)    CO2 24  19 - 32 (mEq/L)    Glucose, Bld 107 (*) 70 - 99 (mg/dL)    BUN 30 (*) 6 - 23 (mg/dL)    Creatinine, Ser 8.46  0.50 - 1.10 (mg/dL)    Calcium 9.5  8.4 - 10.5 (mg/dL)    Total Protein 7.1  6.0 - 8.3 (g/dL)    Albumin 3.5  3.5 - 5.2 (g/dL)  AST 15  0 - 37 (U/L)    ALT 10  0 - 35 (U/L)    Alkaline Phosphatase 161 (*) 39 - 117 (U/L)    Total Bilirubin 0.5  0.3 - 1.2 (mg/dL)    GFR calc non Af Amer 74 (*) >90 (mL/min)    GFR calc Af Amer 86 (*) >90 (mL/min)   LIPASE, BLOOD     Status: Normal   Collection Time   09/27/11  1:30 PM      Component Value Range Comment   Lipase 21  11 - 59 (U/L)    Ct Abdomen Pelvis W Contrast  09/27/2011  *RADIOLOGY REPORT*  Clinical Data: Abdominal pain and distention.  Small bowel obstruction.   Multiple bowel surgeries.  CT ABDOMEN AND PELVIS WITH CONTRAST  Technique:  Multidetector CT imaging of the abdomen and pelvis was performed following the standard protocol during bolus administration of intravenous contrast.  Contrast: OMNIPAQUE IOHEXOL 300 MG/ML IJ SOLN  Comparison: 10/28/2010  Findings: Severe dilatation of multiple small bowel loops are seen within the abdomen and pelvis.  There is a transition point just deep to the anterior abdominal wall near the umbilicus, where there are some surgical staples and also some high attenuation material which may represent oral contrast.  This may be due to adhesion or a small hernia at this site in the anterior abdominal wall soft tissues. Distal right lower quadrant ileostomy site is noted.  There is no evidence of pneumatosis or portal venous gas. No evidence of free air.  The Diffuse biliary ductal dilatation is stable and may be due to prior cholecystectomy.  A few small hepatic cysts are noted but no liver masses are identified.  The other abdominal parenchymal organs are normal in appearance. There is no evidence of hydronephrosis.  Shotty retroperitoneal lymph nodes are stable.  Previous hysterectomy noted.  No acute inflammatory process or abscess identified.  Minimal free fluid noted in the pelvic cul-de-sac.  IMPRESSION:  1.  High-grade mid small bowel obstruction, with transition point at the the anterior abdominal wall soft tissues in the area of the umbilicus.  This is suspicious for an adhesion or small internal hernia. 2.  Small amount of free pelvic fluid.  No evidence of pneumatosis or free air. 3.  Stable diffuse biliary ductal dilatation, which may be related to prior cholecystectomy.  Original Report Authenticated By: Danae Orleans, M.D.    Review of Systems  Constitutional: Negative.   HENT: Negative.   Eyes: Negative.   Respiratory: Negative.   Cardiovascular: Negative.   Gastrointestinal: Positive for nausea, vomiting and  abdominal pain.  Musculoskeletal: Negative.   Skin: Negative.   Endo/Heme/Allergies: Negative.     Blood pressure 128/68, pulse 79, temperature 97.6 F (36.4 C), temperature source Oral, resp. rate 20, height 5' 5.5" (1.664 m), weight 72.122 kg (159 lb), SpO2 100.00%. Physical Exam  Constitutional: She is oriented to person, place, and time. She appears well-developed and well-nourished.  HENT:  Head: Normocephalic.  Neck: Normal range of motion. Neck supple.  Cardiovascular: Normal rate, regular rhythm and normal heart sounds.   Respiratory: Effort normal and breath sounds normal.  GI: Soft. She exhibits distension. There is no tenderness. There is no rebound and no guarding.  Neurological: She is alert and oriented to person, place, and time.  Skin: Skin is warm and dry.     Assessment/Plan Impression: Small bowel obstruction by secondary to adhesive disease. History of bladder cancer Plan: Patient in  mental hospital for nasogastric tube decompression. She does not need acute surgical intervention at this time. We'll continue Rocephin for treatment of a urinary tract infection.  Anida Deol A 09/27/2011, 5:38 PM

## 2011-09-27 NOTE — ED Notes (Signed)
Pt having ab pain for 1 week, feels worse today.  Specimen sent to lab

## 2011-09-27 NOTE — ED Provider Notes (Signed)
This chart was scribed for Charlene Hutching, MD by Williemae Natter. The patient was seen in room APA11/APA11 at 12:46 PM.  CSN: [id number]  Arrival date & time 09/27/11  1136   First MD Initiated Contact with Patient 09/27/11 1158      Chief Complaint  Patient presents with  . Abdominal Pain    (Consider location/radiation/quality/duration/timing/severity/associated sxs/prior treatment) HPI [name] is a 61 y.o. female who presents to the Emergency Department complaining of acute onset moderate to severe abdominal pain that radiates from epigastrium to the umbilicus at the midline. Pain is intermittent about 1 hr at a time every day. Pain is a stabbing sensation. Pt has a TPN in place since August of last year. Pt had a back surgery in '97 at [facility] and her intestines were punctured. Pt had bladder removed in 2010 at [facility] and her intestines were punctured again. Pt has bowel obstruction.  Past Medical History  Diagnosis Date  . Bowel obstruction several  . Cancer bladder  . Thyroid disease   . Acid reflux   . Chronic back pain   . Hyperlipidemia   . Hypertension     Past Surgical History  Procedure Date  . Back surgery   . Hernia repair   . Abdominal surgery   . Cholecystectomy     Family History  Problem Relation Age of Onset  . Heart disease Mother     enlarged  . Hypertension Mother   . Diabetes Father   . Diabetes Sister   . Diabetes Maternal Grandmother     History  Substance Use Topics  . Smoking status: Former Games developer  . Smokeless tobacco: Not on file  . Alcohol Use: No    OB History    Grav Para Term Preterm Abortions TAB SAB Ect Mult Living                  Review of Systems  Constitutional: Negative for fever.  Gastrointestinal: Positive for abdominal pain. Negative for nausea and vomiting.  Psychiatric/Behavioral: Negative for behavioral problems and confusion.  All other systems reviewed and are negative.    Allergies  Codeine;  Nitrofuran derivatives; and Sulfa antibiotics  Home Medications   Current Outpatient Rx  Name Route Sig Dispense Refill  . DEXLANSOPRAZOLE 60 MG PO CPDR Oral Take 60 mg by mouth daily.      Marland Kitchen DOCUSATE SODIUM 100 MG PO CAPS Oral Take 100 mg by mouth 2 (two) times daily.     Marland Kitchen LEVOTHYROXINE SODIUM 125 MCG PO TABS Oral Take 125 mcg by mouth daily.    . ADULT MULTIVITAMIN W/MINERALS CH Oral Take 1 tablet by mouth every other day.    . OXYCODONE HCL 5 MG PO TABS Oral Take 1 tablet (5 mg total) by mouth every 6 (six) hours as needed. 30 tablet 0  . POLYETHYLENE GLYCOL 3350 PO POWD Oral Take 17 g by mouth at bedtime.     . SENNOSIDES 8.6 MG PO TABS Oral Take 1 tablet by mouth 2 (two) times daily.     . TPN NICU Intravenous Inject into the vein continuous. For 12 hours daily from 8:30pm or 8:30pm or 9:30am to 9:30am Only Sundays, Mondays, Wednesdays, and Fridays      BP 135/76  Pulse 89  Temp(Src) 97.6 F (36.4 C) (Oral)  Resp 20  Ht 5' 5.5" (1.664 m)  Wt 159 lb (72.122 kg)  BMI 26.06 kg/m2  SpO2 100%  Physical Exam  Nursing note  and vitals reviewed. Constitutional: She is oriented to person, place, and time. She appears well-developed and well-nourished.  HENT:  Head: Normocephalic and atraumatic.  Eyes: EOM are normal. Pupils are equal, round, and reactive to light.  Neck: Normal range of motion. Neck supple.  Cardiovascular: Normal rate.   Pulmonary/Chest: Effort normal. No respiratory distress.  Abdominal: There is tenderness (diffusely trnder over all).       Vertical scar over abd  Musculoskeletal: Normal range of motion.  Neurological: She is alert and oriented to person, place, and time. She exhibits normal muscle tone.  Skin: Skin is warm and dry.  Psychiatric: She has a normal mood and affect. Her behavior is normal.    ED Course  Procedures (including critical care time) CT scan and lab work pain meds Labs Reviewed  URINALYSIS, ROUTINE W REFLEX MICROSCOPIC -  Abnormal; Notable for the following:    APPearance HAZY (*)    Hgb urine dipstick MODERATE (*)    Protein, ur 30 (*)    Leukocytes, UA MODERATE (*)    All other components within normal limits  URINE MICROSCOPIC-ADD ON - Abnormal; Notable for the following:    Bacteria, UA FEW (*)    All other components within normal limits   No results found.   No diagnosis found.    MDM  Patient has been on TPN for several months or issues with small bowel obstruction. Previous abdominal surgery leading to perforated bowel. Discuss with Dr. Estell Harpin.  Will followup on CT scan of abdomen and pelvis.  I personally performed the services described in this documentation, which was scribed in my presence. The recorded information has been reviewed and considered.         Charlene Hutching, MD 09/27/11 [phone]

## 2011-09-28 LAB — COMPREHENSIVE METABOLIC PANEL
Albumin: 2.9 g/dL — ABNORMAL LOW (ref 3.5–5.2)
Alkaline Phosphatase: 159 U/L — ABNORMAL HIGH (ref 39–117)
BUN: 19 mg/dL (ref 6–23)
CO2: 25 mEq/L (ref 19–32)
Chloride: 101 mEq/L (ref 96–112)
Creatinine, Ser: 1.03 mg/dL (ref 0.50–1.10)
GFR calc Af Amer: 67 mL/min — ABNORMAL LOW (ref >90)
GFR calc non Af Amer: 58 mL/min — ABNORMAL LOW (ref >90)
Glucose, Bld: 105 mg/dL — ABNORMAL HIGH (ref 70–99)
Potassium: 4 mEq/L (ref 3.5–5.1)
Total Bilirubin: 0.5 mg/dL (ref 0.3–1.2)

## 2011-09-28 LAB — CBC
Platelets: 240 10*3/uL (ref 150–400)
RBC: 3.92 MIL/uL (ref 3.87–5.11)
RDW: 14.7 % (ref 11.5–15.5)
WBC: 3.8 10*3/uL — ABNORMAL LOW (ref 4.0–10.5)

## 2011-09-28 LAB — MAGNESIUM: Magnesium: 1.4 mg/dL — ABNORMAL LOW (ref 1.5–2.5)

## 2011-09-28 MED ORDER — SODIUM CHLORIDE 0.9 % IJ SOLN
INTRAMUSCULAR | Status: AC
  Administered 2011-09-28: 23:00:00
  Filled 2011-09-28: qty 6

## 2011-09-28 MED ORDER — MENTHOL 3 MG MT LOZG
1.0000 | LOZENGE | OROMUCOSAL | Status: DC | PRN
  Administered 2011-09-28: 3 mg via ORAL
  Filled 2011-09-28: qty 9

## 2011-09-28 NOTE — Progress Notes (Signed)
Subjective: No significant abdominal pain. No bowel movement or flatus yet.  Objective: Vital signs in last 24 hours: Temp:  [97.6 F (36.4 C)-98.4 F (36.9 C)] 98 F (36.7 C) (03/30 0515) Pulse Rate:  [79-98] 98  (03/30 0515) Resp:  [17-20] 18  (03/30 0515) BP: (100-136)/(68-89) 100/68 mmHg (03/30 0515) SpO2:  [98 %-100 %] 99 % (03/30 0515) Weight:  [72.122 kg (159 lb)] 72.122 kg (159 lb) (03/29 1142) Last BM Date: 09/26/11  Intake/Output from previous day: 03/29 0701 - 03/30 0700 In: 2206.7 [I.V.:2106.7; NG/GT:100] Out: 1600 [Urine:400; Emesis/NG output:1200] Intake/Output this shift:    General appearance: alert and cooperative Resp: clear to auscultation bilaterally Cardio: regular rate and rhythm, S1, S2 normal, no murmur, click, rub or gallop GI: Soft, still distended but not tense. Ileal conduit pink and patent.  Lab Results:   Basename 09/28/11 0700 09/27/11 1330  WBC 3.8* 5.3  HGB 10.8* 12.2  HCT 30.5* 34.1*  PLT 240 266   BMET  Basename 09/27/11 1330  NA 135  K 3.9  CL 101  CO2 24  GLUCOSE 107*  BUN 30*  CREATININE 0.84  CALCIUM 9.5   PT/INR No results found for this basename: LABPROT:2,INR:2 in the last 72 hours  Studies/Results: Ct Abdomen Pelvis W Contrast  09/27/2011  *RADIOLOGY REPORT*  Clinical Data: Abdominal pain and distention.  Small bowel obstruction.  Multiple bowel surgeries.  CT ABDOMEN AND PELVIS WITH CONTRAST  Technique:  Multidetector CT imaging of the abdomen and pelvis was performed following the standard protocol during bolus administration of intravenous contrast.  Contrast: OMNIPAQUE IOHEXOL 300 MG/ML IJ SOLN  Comparison: 10/28/2010  Findings: Severe dilatation of multiple small bowel loops are seen within the abdomen and pelvis.  There is a transition point just deep to the anterior abdominal wall near the umbilicus, where there are some surgical staples and also some high attenuation material which may represent oral  contrast.  This may be due to adhesion or a small hernia at this site in the anterior abdominal wall soft tissues. Distal right lower quadrant ileostomy site is noted.  There is no evidence of pneumatosis or portal venous gas. No evidence of free air.  The Diffuse biliary ductal dilatation is stable and may be due to prior cholecystectomy.  A few small hepatic cysts are noted but no liver masses are identified.  The other abdominal parenchymal organs are normal in appearance. There is no evidence of hydronephrosis.  Shotty retroperitoneal lymph nodes are stable.  Previous hysterectomy noted.  No acute inflammatory process or abscess identified.  Minimal free fluid noted in the pelvic cul-de-sac.  IMPRESSION:  1.  High-grade mid small bowel obstruction, with transition point at the the anterior abdominal wall soft tissues in the area of the umbilicus.  This is suspicious for an adhesion or small internal hernia. 2.  Small amount of free pelvic fluid.  No evidence of pneumatosis or free air. 3.  Stable diffuse biliary ductal dilatation, which may be related to prior cholecystectomy.  Original Report Authenticated By: Danae Orleans, M.D.    Anti-infectives: Anti-infectives     Start     Dose/Rate Route Frequency Ordered Stop   09/28/11 1600   cefTRIAXone (ROCEPHIN) 1 g in dextrose 5 % 50 mL IVPB        1 g 100 mL/hr over 30 Minutes Intravenous Every 24 hours 09/27/11 1925     09/27/11 1600   cefTRIAXone (ROCEPHIN) 1 g in dextrose 5 % 50  mL IVPB        1 g 100 mL/hr over 30 Minutes Intravenous  Once 09/27/11 1554 09/27/11 1639          Assessment/Plan: Impression: Small bowel obstruction, no significant progression to require surgical intervention at this time. We'll continue nasogastric tube decompression.  LOS: 1 day    [name] A 09/28/2011

## 2011-09-29 LAB — CBC
HCT: 32.5 % — ABNORMAL LOW (ref 36.0–46.0)
Hemoglobin: 11.4 g/dL — ABNORMAL LOW (ref 12.0–15.0)
MCHC: 35.1 g/dL (ref 30.0–36.0)
RBC: 4.18 MIL/uL (ref 3.87–5.11)
WBC: 4 10*3/uL (ref 4.0–10.5)

## 2011-09-29 LAB — BASIC METABOLIC PANEL
BUN: 9 mg/dL (ref 6–23)
Chloride: 103 mEq/L (ref 96–112)
GFR calc Af Amer: 75 mL/min — ABNORMAL LOW (ref >90)
GFR calc non Af Amer: 65 mL/min — ABNORMAL LOW (ref >90)
Glucose, Bld: 113 mg/dL — ABNORMAL HIGH (ref 70–99)
Potassium: 4 mEq/L (ref 3.5–5.1)
Sodium: 136 mEq/L (ref 135–145)

## 2011-09-29 LAB — URINE CULTURE

## 2011-09-29 LAB — MAGNESIUM: Magnesium: 1.2 mg/dL — ABNORMAL LOW (ref 1.5–2.5)

## 2011-09-29 MED ORDER — BISACODYL 5 MG PO TBEC
5.0000 mg | DELAYED_RELEASE_TABLET | Freq: Two times a day (BID) | ORAL | Status: DC | PRN
  Filled 2011-09-29: qty 1

## 2011-09-29 MED ORDER — SENNOSIDES-DOCUSATE SODIUM 8.6-50 MG PO TABS
1.0000 | ORAL_TABLET | Freq: Two times a day (BID) | ORAL | Status: DC
  Administered 2011-09-29 – 2011-09-30 (×3): 1 via ORAL
  Filled 2011-09-29 (×3): qty 1

## 2011-09-29 MED ORDER — SODIUM CHLORIDE 0.9 % IJ SOLN
INTRAMUSCULAR | Status: AC
  Administered 2011-09-29: 08:00:00
  Filled 2011-09-29: qty 6

## 2011-09-29 MED ORDER — SODIUM CHLORIDE 0.9 % IJ SOLN
INTRAMUSCULAR | Status: AC
  Administered 2011-09-29: 10 mL
  Filled 2011-09-29: qty 3

## 2011-09-29 MED ORDER — MAGNESIUM SULFATE 40 MG/ML IJ SOLN
2.0000 g | INTRAMUSCULAR | Status: AC
  Administered 2011-09-29 (×2): 2 g via INTRAVENOUS
  Filled 2011-09-29 (×2): qty 50

## 2011-09-29 MED ORDER — POLYETHYLENE GLYCOL 3350 17 G PO PACK
17.0000 g | PACK | Freq: Two times a day (BID) | ORAL | Status: DC
  Administered 2011-09-29 – 2011-09-30 (×3): 17 g via ORAL
  Filled 2011-09-29 (×3): qty 1

## 2011-09-29 NOTE — Plan of Care (Signed)
Problem: Phase I Progression Outcomes Goal: Pain controlled with appropriate interventions Outcome: Completed/Met Date Met:  09/29/11 Dilaudid has been effective for pts complaint of abdominal pain.

## 2011-09-29 NOTE — Progress Notes (Signed)
Subjective: Had bowel movement yesterday. Feeling much better.  Objective: Vital signs in last 24 hours: Temp:  [98.5 F (36.9 C)-99.1 F (37.3 C)] 98.6 F (37 C) (03/31 0528) Pulse Rate:  [79-83] 83  (03/31 0528) Resp:  [16-18] 18  (03/31 0528) BP: (124-144)/(83-87) 128/85 mmHg (03/31 0528) SpO2:  [94 %-98 %] 97 % (03/31 0528) Last BM Date: 09/28/11  Intake/Output from previous day: 03/30 0701 - 03/31 0700 In: 2012.5 [I.V.:1962.5; IV Piggyback:50] Out: 4625 [Urine:3975; Emesis/NG output:650] Intake/Output this shift: Total I/O In: 10 [I.V.:10] Out: -   General appearance: alert and cooperative Resp: clear to auscultation bilaterally Cardio: regular rate and rhythm, S1, S2 normal, no murmur, click, rub or gallop GI: Soft. Less distended. Good bowel sounds heard.  Lab Results:   Basename 09/29/11 0641 09/28/11 0700  WBC 4.0 3.8*  HGB 11.4* 10.8*  HCT 32.5* 30.5*  PLT 242 240   BMET  Basename 09/29/11 0641 09/28/11 0700  NA 136 132*  K 4.0 4.0  CL 103 101  CO2 25 25  GLUCOSE 113* 105*  BUN 9 19  CREATININE 0.94 1.03  CALCIUM 9.5 9.0   PT/INR No results found for this basename: LABPROT:2,INR:2 in the last 72 hours  Studies/Results: Ct Abdomen Pelvis W Contrast  09/27/2011  *RADIOLOGY REPORT*  Clinical Data: Abdominal pain and distention.  Small bowel obstruction.  Multiple bowel surgeries.  CT ABDOMEN AND PELVIS WITH CONTRAST  Technique:  Multidetector CT imaging of the abdomen and pelvis was performed following the standard protocol during bolus administration of intravenous contrast.  Contrast: OMNIPAQUE IOHEXOL 300 MG/ML IJ SOLN  Comparison: 10/28/2010  Findings: Severe dilatation of multiple small bowel loops are seen within the abdomen and pelvis.  There is a transition point just deep to the anterior abdominal wall near the umbilicus, where there are some surgical staples and also some high attenuation material which may represent oral contrast.   This may be due to adhesion or a small hernia at this site in the anterior abdominal wall soft tissues. Distal right lower quadrant ileostomy site is noted.  There is no evidence of pneumatosis or portal venous gas. No evidence of free air.  The Diffuse biliary ductal dilatation is stable and may be due to prior cholecystectomy.  A few small hepatic cysts are noted but no liver masses are identified.  The other abdominal parenchymal organs are normal in appearance. There is no evidence of hydronephrosis.  Shotty retroperitoneal lymph nodes are stable.  Previous hysterectomy noted.  No acute inflammatory process or abscess identified.  Minimal free fluid noted in the pelvic cul-de-sac.  IMPRESSION:  1.  High-grade mid small bowel obstruction, with transition point at the the anterior abdominal wall soft tissues in the area of the umbilicus.  This is suspicious for an adhesion or small internal hernia. 2.  Small amount of free pelvic fluid.  No evidence of pneumatosis or free air. 3.  Stable diffuse biliary ductal dilatation, which may be related to prior cholecystectomy.  Original Report Authenticated By: Danae Orleans, M.D.    Anti-infectives: Anti-infectives     Start     Dose/Rate Route Frequency Ordered Stop   09/28/11 1600   cefTRIAXone (ROCEPHIN) 1 g in dextrose 5 % 50 mL IVPB        1 g 100 mL/hr over 30 Minutes Intravenous Every 24 hours 09/27/11 1925     09/27/11 1600   cefTRIAXone (ROCEPHIN) 1 g in dextrose 5 % 50 mL IVPB  1 g 100 mL/hr over 30 Minutes Intravenous  Once 09/27/11 1554 09/27/11 1639          Assessment/Plan: Impression: Small bowel obstruction, resolving. Plan: Will remove NG tube and advance diet as tolerated. Will supplement magnesium.   LOS: 2 days    [name] A 09/29/2011

## 2011-09-29 NOTE — Progress Notes (Signed)
Pt states she takes Miralax BID, Senna BID and Dulcolax BID at home. Dr. Lovell Sheehan notified and orders received for Miralax BID, Senekot BID and Dulcolax BID PRN. Orders placed in patient's chart. Will continue to monitor.

## 2011-09-30 LAB — CBC
HCT: 32.2 % — ABNORMAL LOW (ref 36.0–46.0)
MCV: 77.6 fL — ABNORMAL LOW (ref 78.0–100.0)
Platelets: 244 10*3/uL (ref 150–400)
RBC: 4.15 MIL/uL (ref 3.87–5.11)
RDW: 14.4 % (ref 11.5–15.5)
WBC: 4.6 10*3/uL (ref 4.0–10.5)

## 2011-09-30 LAB — PHOSPHORUS: Phosphorus: 4.2 mg/dL (ref 2.3–4.6)

## 2011-09-30 LAB — BASIC METABOLIC PANEL
CO2: 25 mEq/L (ref 19–32)
Chloride: 103 mEq/L (ref 96–112)
GFR calc Af Amer: 73 mL/min — ABNORMAL LOW (ref >90)
Potassium: 3.9 mEq/L (ref 3.5–5.1)

## 2011-09-30 MED ORDER — SODIUM CHLORIDE 0.9 % IJ SOLN
10.0000 mL | Freq: Two times a day (BID) | INTRAMUSCULAR | Status: DC
  Administered 2011-09-30: 10 mL

## 2011-09-30 MED ORDER — HEPARIN SOD (PORK) LOCK FLUSH 100 UNIT/ML IV SOLN
500.0000 [IU] | INTRAVENOUS | Status: AC | PRN
  Administered 2011-09-30: 500 [IU]
  Filled 2011-09-30: qty 5

## 2011-09-30 MED ORDER — SODIUM CHLORIDE 0.9 % IJ SOLN
INTRAMUSCULAR | Status: AC
  Administered 2011-09-30: 06:00:00
  Filled 2011-09-30: qty 6

## 2011-09-30 MED ORDER — CIPROFLOXACIN HCL 500 MG PO TABS: 500.0000 mg | ORAL_TABLET | Freq: Two times a day (BID) | ORAL | Status: AC

## 2011-09-30 MED ORDER — SODIUM CHLORIDE 0.9 % IJ SOLN
INTRAMUSCULAR | Status: AC
  Filled 2011-09-30: qty 6

## 2011-09-30 MED ORDER — OXYCODONE HCL 5 MG PO TABS: 5.0000 mg | ORAL_TABLET | Freq: Four times a day (QID) | ORAL | Status: DC | PRN

## 2011-09-30 MED ORDER — SODIUM CHLORIDE 0.9 % IJ SOLN: 10.0000 mL | INTRAMUSCULAR | Status: DC | PRN

## 2011-09-30 NOTE — Discharge Planning (Signed)
Patient discharged home accompanied by family. Is in stable condition.  RX's were given to patient.  All questions and concerns addressed.

## 2011-09-30 NOTE — Progress Notes (Signed)
UR Chart Review Completed  

## 2011-09-30 NOTE — Discharge Summary (Signed)
Physician Discharge Summary  Patient ID: [name] MRN: [id number] DOB/AGE: 1950/11/30 61 y.o.  Admit date: 09/27/2011 Discharge date: 09/30/2011  Admission Diagnoses: Partial small bowel obstruction secondary to adhesive disease  Discharge Diagnoses: Same Active Problems:  * No active hospital problems. *    Discharged Condition: good  Hospital Course: Patient is a 61 year old black female who's had multiple abdominal surgeries in the past who presented with a partial small bowel obstruction. This was felt to be secondary to adhesive disease. She has had episodes like this in the past which have resolved without surgery. She did require NG tube for approximately 48 hours. Her obstruction resolved and her diet was advanced without difficulty. She is being discharged home on 09/30/2011 in good improving condition.  Significant Diagnostic Studies: CT scan the abdomen and pelvis in the emergency room  Discharge Exam: Blood pressure 103/72, pulse 76, temperature 97.9 F (36.6 C), temperature source Oral, resp. rate 18, height 5' 5.5" (1.664 m), weight 72.122 kg (159 lb), SpO2 96.00%. General appearance: alert, cooperative and no distress Resp: clear to auscultation bilaterally Cardio: regular rate and rhythm, S1, S2 normal, no murmur, click, rub or gallop GI: Soft, flat. Bowel sounds heard. Ileal conduit ostomy functioning and intact. No abdominal tenderness noted. Abdominal distention resolved.  Disposition: 01-Home or Self Care  Discharge Orders    Future Appointments: Provider: Department: Dept Phone: Center:   10/01/2011 2:30 PM Salley Scarlet, MD [facility] [phone] [facility]     Medication List  As of 09/30/2011  9:34 AM   TAKE these medications         ciprofloxacin 500 MG tablet   Commonly known as: CIPRO   Take 1 tablet (500 mg total) by mouth 2 (two) times daily.      DEXILANT 60 MG capsule   Generic drug: dexlansoprazole   Take 60 mg by mouth daily.      docusate sodium 100 MG capsule   Commonly known as: COLACE   Take 100 mg by mouth 2 (two) times daily.      levothyroxine 125 MCG tablet   Commonly known as: SYNTHROID, LEVOTHROID   Take 125 mcg by mouth daily.      mulitivitamin with minerals Tabs   Take 1 tablet by mouth every other day.      oxyCODONE 5 MG immediate release tablet   Commonly known as: Oxy IR/ROXICODONE   Take 1 tablet (5 mg total) by mouth every 6 (six) hours as needed.      polyethylene glycol powder powder   Commonly known as: GLYCOLAX/MIRALAX   Take 17 g by mouth at bedtime.      senna 8.6 MG tablet   Commonly known as: SENOKOT   Take 1 tablet by mouth 2 (two) times daily.      TPN NICU   Inject into the vein continuous. For 12 hours daily from 8:30pm or 8:30pm or 9:30am to 9:30am  Only Sundays, Mondays, Wednesdays, and Fridays           Follow-up Information    Call Milinda Antis, MD. (As needed)          SignedFranky Macho A 09/30/2011, 9:34 AM

## 2011-09-30 NOTE — Progress Notes (Signed)
   CARE MANAGEMENT NOTE 09/30/2011  Patient:  TAZARIA, DLUGOSZ   Account Number:  [id number]  Date Initiated:  09/30/2011  Documentation initiated by:  Sharrie Rothman  Subjective/Objective Assessment:   Pt admitted from home with SBO. Pt lives at home with great support from children. Pt is active with [facility] for TPN, RN.     Action/Plan:   Pt stated no other HH or DME needs noted at this time. Carney Bern at [facility] notified of pts admission.   Anticipated DC Date:  10/07/2011   Anticipated DC Plan:  HOME W HOME HEALTH SERVICES      DC Planning Services  CM consult      [facility] Choice  HOME HEALTH   Choice offered to / List presented to:  C-1 Patient        HH arranged  HH-1 RN      [facility] agency  [facility] Care   Status of service:  Completed, signed off Medicare Important Message given?  YES (If response is "NO", the following Medicare IM given date fields will be blank) Date Medicare IM given:  09/30/2011 Date Additional Medicare IM given:    Discharge Disposition:  HOME W HOME HEALTH SERVICES  Per UR Regulation:    If discussed at Long Length of Stay Meetings, dates discussed:    Comments:  09/30/11 1100 Arlyss Queen, RN BSN CM Pt discharged home today. Pt will resume [facility] for RN for TPN. Faxed order and d/c summary to Geradine Girt at [phone]. Pt aware of phone call and pts nurse also made aware of resumption of HH with [facility].

## 2011-09-30 NOTE — Discharge Instructions (Signed)
Intestinal Obstruction An intestinal obstruction comes from a physical blockage in the bowel. Different problems in the bowel may cause these blockages.  CAUSES   Adhesions from previous surgeries   Cancer or tumor   A hernia, which is a condition in which a portion of the bowel bulges out through an opening or weakness in the abdomen (belly). This sometimes squeezes the bowel.   A swallowed object.   Blockage (impaction) with worms is common in third world countries.   A twisting of the bowel or telescoping of a portion of the bowel into another portion (intussusceptions)   Anything that stops food from going through from the stomach to the anus.  SYMPTOMS  Symptoms of bowel obstruction may result in your belly getting bigger (bloating), nausea, vomiting, explosive diarrhea, explosive stool. You may not be able to hear your normal bowel sounds (such as "growling in your stomach"). You may also stop having bowel movements or passing gas. DIAGNOSIS  Usually this condition is diagnosed with a history and an examination. Often, lab studies (blood work) and x-rays may be used to find the cause. TREATMENT  The main treatment of this condition is to rest the intestine (gut). Often times the obstruction may relieve itself and allow the gut to start working again. Think of the gut like a balloon that is blown up (filled with trapped food and water) which has squeezed into a hole or area which it cannot get through.   If the obstruction is complete, an NG tube (nasogastric) is passed through the nose and into the stomach. It is then connected to suction to keep the stomach emptied out. This also helps treat the nausea and vomiting.   If there is an imbalance in the electrolytes, they are corrected with intravenous fluids. These have the proper chemicals in them to correct the problem.   If the reason for the obstruction (blockage) does not get better with conservative (non-surgical) treatment,  surgery may be necessary. Sometimes, surgery is done immediately if your surgeon knows that the problem is not going to get better with conservative treatment.  PROGNOSIS  Depending on what the problem is, most of these problems can be treated by your caregivers with good results. Your surgeon will discuss the best course of action to take, with you or your loved ones. FOLLOWING SURGERY Seek immediate medical attention if you have:  Increasing abdominal pain, repeated vomiting, dehydration or fainting.   Severe weakness, chest pain or back pain.   Blood in your vomit, stool or you have tarry stool.  Document Released: 09/07/2003 Document Revised: 06/06/2011 Document Reviewed: 02/05/2008 [employer] Patient Information 2012 [employer]. 

## 2011-10-01 ENCOUNTER — Encounter: Payer: Self-pay | Admitting: Family Medicine

## 2011-10-01 ENCOUNTER — Ambulatory Visit (INDEPENDENT_AMBULATORY_CARE_PROVIDER_SITE_OTHER): Payer: Medicare Other | Admitting: Family Medicine

## 2011-10-01 VITALS — BP 122/90 | HR 68 | Resp 16 | Ht 65.5 in | Wt 161.0 lb

## 2011-10-01 DIAGNOSIS — R7989 Other specified abnormal findings of blood chemistry: Secondary | ICD-10-CM

## 2011-10-01 DIAGNOSIS — N3090 Cystitis, unspecified without hematuria: Secondary | ICD-10-CM

## 2011-10-01 DIAGNOSIS — K56609 Unspecified intestinal obstruction, unspecified as to partial versus complete obstruction: Secondary | ICD-10-CM

## 2011-10-01 DIAGNOSIS — K760 Fatty (change of) liver, not elsewhere classified: Secondary | ICD-10-CM

## 2011-10-01 DIAGNOSIS — K7689 Other specified diseases of liver: Secondary | ICD-10-CM

## 2011-10-01 NOTE — Patient Instructions (Signed)
Continue the antibiotics Watch the fried foods and heavy meals Your ultrasound showed mild fatty liver  Try baked foods and soft foods Have fun on your trip! If your stomach pain gets any worse please go to the ER to be evaluated again F/u in June

## 2011-10-02 DIAGNOSIS — K760 Fatty (change of) liver, not elsewhere classified: Secondary | ICD-10-CM | POA: Insufficient documentation

## 2011-10-02 DIAGNOSIS — K56609 Unspecified intestinal obstruction, unspecified as to partial versus complete obstruction: Secondary | ICD-10-CM | POA: Insufficient documentation

## 2011-10-02 NOTE — Progress Notes (Signed)
  Subjective:    Patient ID: [name], female    DOB: 28-Oct-1950, 61 y.o.   MRN: [id number]  HPI  Patient presents to followup hospital stay for small bowel obstruction. She states she did have abdominal pain on and off for the past 3 weeks. She was taking her oxycodone very rarely, then she began taking it every day which is unlike her and the pain was not controlled. When she presented to the ER she was still having small bowel movements and she has emesis usually once a week however was noted to have a bowel obstruction with a large amount of sludge in the bowels. She was admitted however no surgical intervention was done and NG-tube was placed and she had large output. She is now back to her baseline and feels her abdominal distention and pain is back to baseline. Of note she did have some cramping in the office and began to belch but states she felt okay. She was also started on ciprofloxacin for UTI culture was multi-bacterial. She is planning to go to [facility] with her daughter in 2 weeks  RUQ discussed, mild fatty infiltration of liver  Review of Systems     GEN- denies fatigue, fever, weight loss,weakness, recent illness HEENT- denies eye drainage, change in vision, nasal discharge, CVS- denies chest pain, palpitations RESP- denies SOB, cough, wheeze ABD- + N/V weekly, change in stools, +abd pain GU- denies  hematuria,  MSK- + joint pain, muscle aches, injury Neuro- denies headache, dizziness, syncope, seizure activity   Objective:   Physical Exam GEN- NAD, alert and oriented x3 HEENT- non icteric, MMM, oropharynx clear CVS- RRR, no murmur RESP-CTAB ABD-mild distension, NABS, non tender, ostomy in RLQ with urine bag attached d/c/i- urine clear yelllow, abdomen non tender EXT- No edema Pulses- Radial, DP- 2+ Right chest wall- port-a- cath       Assessment & Plan:

## 2011-10-02 NOTE — Assessment & Plan Note (Signed)
This appears to be secondary to mild fatty liver we'll continue to monitor this patient is to watch any of the foods that she does eat as she does typically like fried chicken in some other meats.

## 2011-10-02 NOTE — Assessment & Plan Note (Signed)
Urine culture did not show any specific bacteria. She will finish the course of Cipro. She is quite worried that she is on Cipro all the time. She is to discuss with her bladder doctor at [facility] thoughts regarding low dose Macrobid.

## 2011-10-02 NOTE — Assessment & Plan Note (Signed)
Now resolved patient tolerating small amounts of by mouth and her TPN. It's now having bowel movements and passing gas

## 2011-10-08 ENCOUNTER — Encounter (HOSPITAL_COMMUNITY): Payer: Self-pay

## 2011-10-08 ENCOUNTER — Emergency Department (HOSPITAL_COMMUNITY)
Admission: EM | Admit: 2011-10-08 | Discharge: 2011-10-08 | Disposition: A | Discharge status: Home / Self Care | Payer: Medicare Other | Attending: Emergency Medicine | Admitting: Emergency Medicine

## 2011-10-08 DIAGNOSIS — Z87891 Personal history of nicotine dependence: Secondary | ICD-10-CM | POA: Insufficient documentation

## 2011-10-08 DIAGNOSIS — A084 Viral intestinal infection, unspecified: Secondary | ICD-10-CM

## 2011-10-08 DIAGNOSIS — K219 Gastro-esophageal reflux disease without esophagitis: Secondary | ICD-10-CM | POA: Insufficient documentation

## 2011-10-08 DIAGNOSIS — A088 Other specified intestinal infections: Secondary | ICD-10-CM | POA: Insufficient documentation

## 2011-10-08 DIAGNOSIS — G8929 Other chronic pain: Secondary | ICD-10-CM | POA: Insufficient documentation

## 2011-10-08 DIAGNOSIS — R197 Diarrhea, unspecified: Secondary | ICD-10-CM

## 2011-10-08 DIAGNOSIS — M549 Dorsalgia, unspecified: Secondary | ICD-10-CM | POA: Insufficient documentation

## 2011-10-08 LAB — POCT I-STAT, CHEM 8
BUN: 27 mg/dL — ABNORMAL HIGH (ref 6–23)
Calcium, Ion: 1.27 mmol/L (ref 1.12–1.32)
Chloride: 107 mEq/L (ref 96–112)
Glucose, Bld: 101 mg/dL — ABNORMAL HIGH (ref 70–99)
HCT: 36 % (ref 36.0–46.0)
Potassium: 3.8 mEq/L (ref 3.5–5.1)

## 2011-10-08 MED ORDER — SODIUM CHLORIDE 0.9 % IV BOLUS (SEPSIS)
1000.0000 mL | Freq: Once | INTRAVENOUS | Status: AC
  Administered 2011-10-08: 1000 mL via INTRAVENOUS

## 2011-10-08 MED ORDER — SODIUM CHLORIDE 0.9 % IV SOLN: INTRAVENOUS | Status: DC

## 2011-10-08 MED ORDER — ONDANSETRON HCL 8 MG PO TABS: 8.0000 mg | ORAL_TABLET | Freq: Three times a day (TID) | ORAL | Status: AC | PRN

## 2011-10-08 NOTE — ED Provider Notes (Signed)
History     CSN: [id number]  Arrival date & time 10/08/11  1531   First MD Initiated Contact with Patient 10/08/11 1544      Chief Complaint  Patient presents with  . Diarrhea    (Consider location/radiation/quality/duration/timing/severity/associated sxs/prior treatment) Patient is a 61 y.o. female presenting with diarrhea. The history is provided by the patient and medical records.  Diarrhea The primary symptoms include nausea and diarrhea. Primary symptoms do not include fever, weight loss, fatigue, abdominal pain, vomiting, melena, hematemesis, jaundice, hematochezia, dysuria, myalgias, arthralgias or rash. The illness began today. The onset was sudden. The problem has been gradually improving.  Nausea began today. Associated with: diarrhea. Exacerbated by: nothing, preserved po intake (pt. is on tpn and only eats/drinks small amounts normally, this has not changed)  The illness does not include chills, anorexia, dysphagia, bloating, constipation, tenesmus or back pain. Significant associated medical issues include bowel resection. Associated medical issues comments: history of bowel obstructions, chronic, recurrent, due to adhesions, for which the patient tells me she was deemed not a surgical candidate and treated with tpn and supportive care only..    Past Medical History  Diagnosis Date  . Bowel obstruction several  . Cancer bladder  . Thyroid disease   . Acid reflux   . Chronic back pain   . Hyperlipidemia   . Hypertension     pt says taken off medication since has lost weight.    Past Surgical History  Procedure Date  . Back surgery   . Hernia repair   . Abdominal surgery   . Cholecystectomy     Family History  Problem Relation Age of Onset  . Heart disease Mother     enlarged  . Hypertension Mother   . Diabetes Father   . Diabetes Sister   . Diabetes Maternal Grandmother     History  Substance Use Topics  . Smoking status: Former Smoker    Quit date:  09/26/2010  . Smokeless tobacco: Not on file  . Alcohol Use: No    OB History    Grav Para Term Preterm Abortions TAB SAB Ect Mult Living                  Review of Systems  Constitutional: Negative for fever, chills, weight loss and fatigue.  Eyes: Negative.   Respiratory: Negative for cough and shortness of breath.   Cardiovascular: Negative for chest pain.  Gastrointestinal: Positive for nausea and diarrhea. Negative for dysphagia, vomiting, abdominal pain, constipation, blood in stool, melena, hematochezia, abdominal distention, anal bleeding, bloating, anorexia, hematemesis and jaundice.  Genitourinary: Negative for dysuria.  Musculoskeletal: Negative for myalgias, back pain and arthralgias.  Skin: Negative for rash.  Neurological: Negative for dizziness, syncope, light-headedness and headaches.  Psychiatric/Behavioral: Negative.   All other systems reviewed and are negative.    Allergies  Codeine; Nitrofuran derivatives; and Sulfa antibiotics  Home Medications   Current Outpatient Rx  Name Route Sig Dispense Refill  . RA SINUS TABLETS EX ST PO Oral Take 1 tablet by mouth 2 (two) times daily as needed. For sinus relief    . CIPROFLOXACIN HCL 500 MG PO TABS Oral Take 1 tablet (500 mg total) by mouth 2 (two) times daily. 20 tablet 0  . DEXLANSOPRAZOLE 60 MG PO CPDR Oral Take 60 mg by mouth daily.      Marland Kitchen DOCUSATE SODIUM 100 MG PO CAPS Oral Take 100 mg by mouth 2 (two) times daily.     Marland Kitchen  LEVOTHYROXINE SODIUM 125 MCG PO TABS Oral Take 125 mcg by mouth daily.    . ADULT MULTIVITAMIN W/MINERALS CH Oral Take 1 tablet by mouth every other day. On Tuesdays, Thursdays, Saturdays, and Sundays    . OXYCODONE HCL 5 MG PO TABS Oral Take 1 tablet (5 mg total) by mouth every 6 (six) hours as needed. 40 tablet 0  . POLYETHYLENE GLYCOL 3350 PO POWD Oral Take 17 g by mouth at bedtime.     . SENNOSIDES 8.6 MG PO TABS Oral Take 1 tablet by mouth 2 (two) times daily.     . TPN NICU  Intravenous Inject into the vein continuous. For 12 hours daily from 8:30pm or 8:30pm or 9:30am to 9:30am Only Sundays, Mondays, Wednesdays, and Fridays      BP 126/80  Pulse 92  Temp(Src) 97.6 F (36.4 C) (Oral)  Resp 20  Ht 5\' 5"  (1.651 m)  Wt 152 lb (68.947 kg)  BMI 25.29 kg/m2  SpO2 100%  Physical Exam  Nursing note and vitals reviewed. Constitutional: She is oriented to person, place, and time. She appears well-developed and well-nourished. She is active.  Non-toxic appearance. She does not have a sickly appearance. She does not appear ill. No distress.  HENT:  Head: Normocephalic and atraumatic.  Right Ear: Hearing, tympanic membrane, external ear and ear canal normal.  Left Ear: Hearing, tympanic membrane, external ear and ear canal normal.  Nose: Nose normal. No mucosal edema.  Mouth/Throat: Uvula is midline and oropharynx is clear and moist. Mucous membranes are dry. No oral lesions. No uvula swelling. No oropharyngeal exudate, posterior oropharyngeal edema, posterior oropharyngeal erythema or tonsillar abscesses.  Eyes: Conjunctivae and EOM are normal. Pupils are equal, round, and reactive to light. Right eye exhibits no chemosis, no discharge and no exudate. Left eye exhibits no chemosis, no discharge and no exudate. Right conjunctiva is not injected. Left conjunctiva is not injected. No scleral icterus.  Neck: Normal range of motion, full passive range of motion without pain and phonation normal. Neck supple. No rigidity. No Brudzinski's sign noted.  Cardiovascular: Normal rate, regular rhythm, intact distal pulses and normal pulses.   No extrasystoles are present.  Pulmonary/Chest: Effort normal and breath sounds normal. No accessory muscle usage. Not tachypneic. No respiratory distress. She has no decreased breath sounds. She has no wheezes. She has no rhonchi. She has no rales. She exhibits no tenderness, no crepitus and no retraction.  Abdominal: Soft. Normal appearance and  bowel sounds are normal. She exhibits no shifting dullness, no distension, no pulsatile liver, no fluid wave, no abdominal bruit, no ascites, no pulsatile midline mass and no mass. There is no hepatosplenomegaly. There is no tenderness. There is no rigidity, no rebound, no guarding, no CVA tenderness, no tenderness at McBurney's point and negative Murphy's sign. No hernia.       Long vertical scar from prior surgery, colostomy in place with watery brown stool in bag.  No blood or mucous.  Musculoskeletal: Normal range of motion.  Neurological: She is alert and oriented to person, place, and time. She has normal strength and normal reflexes. She is not disoriented. No cranial nerve deficit. Coordination normal. GCS eye subscore is 4. GCS verbal subscore is 5. GCS motor subscore is 6.  Skin: Skin is warm, dry and intact. No bruising, no ecchymosis, no lesion and no rash noted. She is not diaphoretic. No erythema. No pallor.  Psychiatric: She has a normal mood and affect. Her speech is normal and  behavior is normal. Judgment and thought content normal. Cognition and memory are normal.    ED Course  Procedures (including critical care time)  Labs Reviewed  POCT I-STAT, CHEM 8 - Abnormal; Notable for the following:    BUN 27 (*)    Glucose, Bld 101 (*)    All other components within normal limits   No results found.   1. Viral enteritis   2. Diarrhea       MDM  Patient has a nontender abdomen with normal bowel sounds, and she is eating and drinking at her normal amounts, and has acute secretory diarrhea, likely viral in origin without blood or mucus.I do not perceive an acute bowel obstruction at this time. The patient appears well hydrated now after IV fluids and has normal electrolyte balance and kidney function. No serious sequelae from diarrhea. Instructed the patient I will not slow the diarrhea, that she will need to remain hydrated by taking adequate oral fluid and adequate fluid through  her TPN to stay hydrated, I discussed indications of adequate hydration with her, and explained that the diarrhea is part of the body is getting rid of the infection and she'll need to let resolve on her own. The patient states her understanding of and agreement with the plan of care.       Felisa Bonier, MD 10/08/11 [phone]

## 2011-10-08 NOTE — ED Notes (Signed)
Pt c/o diarrhea since last night.  Says has had approx 20episodes of diarrhea since midnight last night.

## 2011-10-08 NOTE — Discharge Instructions (Signed)
Diet for Diarrhea, Adult Having frequent, runny stools (diarrhea) has many causes. Diarrhea may be caused or worsened by food or drink. Diarrhea may be relieved by changing your diet. IF YOU ARE NOT TOLERATING SOLID FOODS:  Drink enough water and fluids to keep your urine clear or pale yellow.   Avoid sugary drinks and sodas as well as milk-based beverages.   Avoid beverages containing caffeine and alcohol.   You may try rehydrating beverages. You can make your own by following this recipe:    tsp table salt.    tsp baking soda.   ? tsp salt substitute (potassium chloride).   1 tbs + 1 tsp sugar.   1 qt water.  As your stools become more solid, you can start eating solid foods. Add foods one at a time. If a certain food causes your diarrhea to get worse, avoid that food and try other foods. A low fiber, low-fat, and lactose-free diet is recommended. Small, frequent meals may be better tolerated.  Starches  Allowed:  White, French, and pita breads, plain rolls, buns, bagels. Plain muffins, matzo. Soda, saltine, or graham crackers. Pretzels, melba toast, zwieback. Cooked cereals made with water: cornmeal, farina, cream cereals. Dry cereals: refined corn, wheat, rice. Potatoes prepared any way without skins, refined macaroni, spaghetti, noodles, refined rice.   Avoid:  Bread, rolls, or crackers made with whole wheat, multi-grains, rye, bran seeds, nuts, or coconut. Corn tortillas or taco shells. Cereals containing whole grains, multi-grains, bran, coconut, nuts, or raisins. Cooked or dry oatmeal. Coarse wheat cereals, granola. Cereals advertised as "high-fiber." Potato skins. Whole grain pasta, wild or brown rice. Popcorn. Sweet potatoes/yams. Sweet rolls, doughnuts, waffles, pancakes, sweet breads.  Vegetables  Allowed: Strained tomato and vegetable juices. Most well-cooked and canned vegetables without seeds. Fresh: Tender lettuce, cucumber without the skin, cabbage, spinach, bean  sprouts.   Avoid: Fresh, cooked, or canned: Artichokes, baked beans, beet greens, broccoli, Brussels sprouts, corn, kale, legumes, peas, sweet potatoes. Cooked: Green or red cabbage, spinach. Avoid large servings of any vegetables, because vegetables shrink when cooked, and they contain more fiber per serving than fresh vegetables.  Fruit  Allowed: All fruit juices except prune juice. Cooked or canned: Apricots, applesauce, cantaloupe, cherries, fruit cocktail, grapefruit, grapes, kiwi, mandarin oranges, peaches, pears, plums, watermelon. Fresh: Apples without skin, ripe banana, grapes, cantaloupe, cherries, grapefruit, peaches, oranges, plums. Keep servings limited to  cup or 1 piece.   Avoid: Fresh: Apple with skin, apricots, mango, pears, raspberries, strawberries. Prune juice, stewed or dried prunes. Dried fruits, raisins, dates. Large servings of all fresh fruits.  Meat and Meat Substitutes  Allowed: Ground or well-cooked tender beef, ham, veal, lamb, pork, or poultry. Eggs, plain cheese. Fish, oysters, shrimp, lobster, other seafoods. Liver, organ meats.   Avoid: Tough, fibrous meats with gristle. Peanut butter, smooth or chunky. Cheese, nuts, seeds, legumes, dried peas, beans, lentils.  Milk  Allowed: Yogurt, lactose-free milk, kefir, drinkable yogurt, buttermilk, soy milk.   Avoid: Milk, chocolate milk, beverages made with milk, such as milk shakes.  Soups  Allowed: Bouillon, broth, or soups made from allowed foods. Any strained soup.   Avoid: Soups made from vegetables that are not allowed, cream or milk-based soups.  Desserts and Sweets  Allowed: Sugar-free gelatin, sugar-free frozen ice pops made without sugar alcohol.   Avoid: Plain cakes and cookies, pie made with allowed fruit, pudding, custard, cream pie. Gelatin, fruit, ice, sherbet, frozen ice pops. Ice cream, ice milk without nuts. Plain hard candy,   honey, jelly, molasses, syrup, sugar, chocolate syrup, gumdrops,  marshmallows.  Fats and Oils  Allowed: Avoid any fats and oils.   Avoid: Seeds, nuts, olives, avocados. Margarine, butter, cream, mayonnaise, salad oils, plain salad dressings made from allowed foods. Plain gravy, crisp bacon without rind.  Beverages  Allowed: Water, decaffeinated teas, oral rehydration solutions, sugar-free beverages.   Avoid: Fruit juices, caffeinated beverages (coffee, tea, soda or pop), alcohol, sports drinks, or lemon-lime soda or pop.  Condiments  Allowed: Ketchup, mustard, horseradish, vinegar, cream sauce, cheese sauce, cocoa powder. Spices in moderation: allspice, basil, bay leaves, celery powder or leaves, cinnamon, cumin powder, curry powder, ginger, mace, marjoram, onion or garlic powder, oregano, paprika, parsley flakes, ground pepper, rosemary, sage, savory, tarragon, thyme, turmeric.   Avoid: Coconut, honey.  Weight Monitoring: Weigh yourself every day. You should weigh yourself in the morning after you urinate and before you eat breakfast. Wear the same amount of clothing when you weigh yourself. Record your weight daily. Bring your recorded weights to your clinic visits. Tell your caregiver right away if you have gained 3 lb/1.4 kg or more in 1 day, 5 lb/2.3 kg in a week, or whatever amount you were told to report. SEEK IMMEDIATE MEDICAL CARE IF:   You are unable to keep fluids down.   You start to throw up (vomit) or diarrhea keeps coming back (persistent).   Abdominal pain develops, increases, or can be felt in one place (localizes).   You have an oral temperature above 102 F (38.9 C), not controlled by medicine.   Diarrhea contains blood or mucus.   You develop excessive weakness, dizziness, fainting, or extreme thirst.  MAKE SURE YOU:   Understand these instructions.   Will watch your condition.   Will get help right away if you are not doing well or get worse.  Document Released: 09/07/2003 Document Revised: 06/06/2011 Document Reviewed:  12/29/2008 [facility] Patient Information 2012 [place], [place].Diarrhea Infections caused by germs (bacterial) or a virus commonly cause diarrhea. Your caregiver has determined that with time, rest and fluids, the diarrhea should improve. In general, eat normally while drinking more water than usual. Although water may prevent dehydration, it does not contain salt and minerals (electrolytes). Broths, weak tea without caffeine and oral rehydration solutions (ORS) replace fluids and electrolytes. Small amounts of fluids should be taken frequently. Large amounts at one time may not be tolerated. Plain water may be harmful in infants and the elderly. Oral rehydrating solutions (ORS) are available at pharmacies and grocery stores. ORS replace water and important electrolytes in proper proportions. Sports drinks are not as effective as ORS and may be harmful due to sugars worsening diarrhea.  ORS is especially recommended for use in children with diarrhea. As a general guideline for children, replace any new fluid losses from diarrhea and/or vomiting with ORS as follows:   If your child weighs 22 pounds or under (10 kg or less), give 60-120 mL ( -  cup or 2 - 4 ounces) of ORS for each episode of diarrheal stool or vomiting episode.   If your child weighs more than 22 pounds (more than 10 kgs), give 120-240 mL ( - 1 cup or 4 - 8 ounces) of ORS for each diarrheal stool or episode of vomiting.   While correcting for dehydration, children should eat normally. However, foods high in sugar should be avoided because this may worsen diarrhea. Large amounts of carbonated soft drinks, juice, gelatin desserts and other highly sugared drinks should be  avoided.   After correction of dehydration, other liquids that are appealing to the child may be added. Children should drink small amounts of fluids frequently and fluids should be increased as tolerated. Children should drink enough fluids to keep urine clear or pale  yellow.   Adults should eat normally while drinking more fluids than usual. Drink small amounts of fluids frequently and increase as tolerated. Drink enough fluids to keep urine clear or pale yellow. Broths, weak decaffeinated tea, lemon lime soft drinks (allowed to go flat) and ORS replace fluids and electrolytes.   Avoid:   Carbonated drinks.   Juice.   Extremely hot or cold fluids.   Caffeine drinks.   Fatty, greasy foods.   Alcohol.   Tobacco.   Too much intake of anything at one time.   Gelatin desserts.   Probiotics are active cultures of beneficial bacteria. They may lessen the amount and number of diarrheal stools in adults. Probiotics can be found in yogurt with active cultures and in supplements.   Wash hands well to avoid spreading bacteria and virus.   Anti-diarrheal medications are not recommended for infants and children.   Only take over-the-counter or prescription medicines for pain, discomfort or fever as directed by your caregiver. Do not give aspirin to children because it may cause Reye's Syndrome.   For adults, ask your caregiver if you should continue all prescribed and over-the-counter medicines.   If your caregiver has given you a follow-up appointment, it is very important to keep that appointment. Not keeping the appointment could result in a chronic or permanent injury, and disability. If there is any problem keeping the appointment, you must call back to this facility for assistance.  SEEK IMMEDIATE MEDICAL CARE IF:   You or your child is unable to keep fluids down or other symptoms or problems become worse in spite of treatment.   Vomiting or diarrhea develops and becomes persistent.   There is vomiting of blood or bile (green material).   There is blood in the stool or the stools are black and tarry.   There is no urine output in 6-8 hours or there is only a small amount of very dark urine.   Abdominal pain develops, increases or localizes.    You have a fever.   Your baby is older than 3 months with a rectal temperature of 102 F (38.9 C) or higher.   Your baby is 56 months old or younger with a rectal temperature of 100.4 F (38 C) or higher.   You or your child develops excessive weakness, dizziness, fainting or extreme thirst.   You or your child develops a rash, stiff neck, severe headache or become irritable or sleepy and difficult to awaken.  MAKE SURE YOU:   Understand these instructions.   Will watch your condition.   Will get help right away if you are not doing well or get worse.  Document Released: 06/07/2002 Document Revised: 06/06/2011 Document Reviewed: 04/24/2009 [facility] Patient Information 2012 [place], [place].Viral Syndrome You or your child has Viral Syndrome. It is the most common infection causing "colds" and infections in the nose, throat, sinuses, and breathing tubes. Sometimes the infection causes nausea, vomiting, or diarrhea. The germ that causes the infection is a virus. No antibiotic or other medicine will kill it. There are medicines that you can take to make you or your child more comfortable.  HOME CARE INSTRUCTIONS   Rest in bed until you start to feel better.  If you have diarrhea or vomiting, eat small amounts of crackers and toast. Soup is helpful.   Do not give aspirin or medicine that contains aspirin to children.   Only take over-the-counter or prescription medicines for pain, discomfort, or fever as directed by your caregiver.  SEEK IMMEDIATE MEDICAL CARE IF:   You or your child has not improved within one week.   You or your child has pain that is not at least partially relieved by over-the-counter medicine.   Thick, colored mucus or blood is coughed up.   Discharge from the nose becomes thick yellow or green.   Diarrhea or vomiting gets worse.   There is any major change in your or your child's condition.   You or your child develops a skin rash, stiff neck, severe  headache, or are unable to hold down food or fluid.   You or your child has an oral temperature above 102 F (38.9 C), not controlled by medicine.   Your baby is older than 3 months with a rectal temperature of 102 F (38.9 C) or higher.   Your baby is 30 months old or younger with a rectal temperature of 100.4 F (38 C) or higher.  Document Released: 06/02/2006 Document Revised: 06/06/2011 Document Reviewed: 06/03/2007 [facility] Patient Information 2012 [place], [place].

## 2011-10-08 NOTE — ED Notes (Signed)
Pt has PICC line in left arm for TPN

## 2011-10-22 ENCOUNTER — Telehealth: Payer: Self-pay | Admitting: Family Medicine

## 2011-10-22 NOTE — Telephone Encounter (Signed)
Spoke with pt and hhn with [facility], [place], and also with French Ana at [facility].  Order sent over to have PICC line de-clotted.

## 2011-10-24 ENCOUNTER — Encounter (HOSPITAL_COMMUNITY): Payer: Medicare Other | Attending: Family Medicine

## 2011-10-24 DIAGNOSIS — T82897A Other specified complication of cardiac prosthetic devices, implants and grafts, initial encounter: Secondary | ICD-10-CM | POA: Insufficient documentation

## 2011-10-24 DIAGNOSIS — Y831 Surgical operation with implant of artificial internal device as the cause of abnormal reaction of the patient, or of later complication, without mention of misadventure at the time of the procedure: Secondary | ICD-10-CM | POA: Insufficient documentation

## 2011-10-24 DIAGNOSIS — T82898A Other specified complication of vascular prosthetic devices, implants and grafts, initial encounter: Secondary | ICD-10-CM

## 2011-10-24 MED ORDER — STERILE WATER FOR INJECTION IJ SOLN
2.0000 mL | Freq: Once | INTRAMUSCULAR | Status: AC
  Administered 2011-10-24: 2 mL via INTRAMUSCULAR

## 2011-10-24 MED ORDER — ALTEPLASE 2 MG IJ SOLR
2.0000 mg | Freq: Once | INTRAMUSCULAR | Status: AC
  Administered 2011-10-24: 2 mg

## 2011-10-24 NOTE — Progress Notes (Signed)
[name] presents today for declotting of picc per MD orders. Picc line flushed easily with 10 cc saline then  cathflo 2mg  in 2 ml sterile water administered IV in left picc line in left arm. Administration without incident. Patient tolerated well. Left clinic at 1220 and will return this afternoon for attempted removal of alteplase. 1615 [name] presented for PICC line flush and alteplase removal.. Proper placement of PICC confirmed by CXR. PICC line located lt arm. Good blood return present and removed 8 cc then PICC line flushed with 20ml NS and 300U/60ml Heparin. Procedure without incident. Patient tolerated procedure well.

## 2011-11-06 ENCOUNTER — Other Ambulatory Visit: Payer: Self-pay | Admitting: Family Medicine

## 2011-11-08 ENCOUNTER — Telehealth: Payer: Self-pay | Admitting: Family Medicine

## 2011-11-08 ENCOUNTER — Ambulatory Visit: Payer: Medicare Other | Admitting: Family Medicine

## 2011-11-08 NOTE — Telephone Encounter (Signed)
You can send order over, for UA and urine culture. This needs to be taken from her ostomy bag.

## 2011-11-08 NOTE — Telephone Encounter (Signed)
Needs home health nurse to get urine culture. Was on med for UTI but she is still having stomach cramps and wants to know if you can send her home health out to get a specimen from her per Dr in [facility]

## 2011-11-09 ENCOUNTER — Emergency Department (HOSPITAL_COMMUNITY): Payer: Medicare Other

## 2011-11-09 ENCOUNTER — Encounter (HOSPITAL_COMMUNITY): Payer: Self-pay | Admitting: *Deleted

## 2011-11-09 ENCOUNTER — Emergency Department (HOSPITAL_COMMUNITY)
Admission: EM | Admit: 2011-11-09 | Discharge: 2011-11-09 | Disposition: A | Discharge status: Home / Self Care | Payer: Medicare Other | Attending: Emergency Medicine | Admitting: Emergency Medicine

## 2011-11-09 DIAGNOSIS — I10 Essential (primary) hypertension: Secondary | ICD-10-CM | POA: Insufficient documentation

## 2011-11-09 DIAGNOSIS — K56609 Unspecified intestinal obstruction, unspecified as to partial versus complete obstruction: Secondary | ICD-10-CM | POA: Insufficient documentation

## 2011-11-09 DIAGNOSIS — Z8551 Personal history of malignant neoplasm of bladder: Secondary | ICD-10-CM | POA: Insufficient documentation

## 2011-11-09 DIAGNOSIS — N390 Urinary tract infection, site not specified: Secondary | ICD-10-CM

## 2011-11-09 DIAGNOSIS — E785 Hyperlipidemia, unspecified: Secondary | ICD-10-CM | POA: Insufficient documentation

## 2011-11-09 DIAGNOSIS — E079 Disorder of thyroid, unspecified: Secondary | ICD-10-CM | POA: Insufficient documentation

## 2011-11-09 DIAGNOSIS — K219 Gastro-esophageal reflux disease without esophagitis: Secondary | ICD-10-CM | POA: Insufficient documentation

## 2011-11-09 DIAGNOSIS — Z79899 Other long term (current) drug therapy: Secondary | ICD-10-CM | POA: Insufficient documentation

## 2011-11-09 LAB — DIFFERENTIAL
Basophils Absolute: 0 10*3/uL (ref 0.0–0.1)
Eosinophils Relative: 1 % (ref 0–5)
Lymphocytes Relative: 23 % (ref 12–46)
Monocytes Absolute: 0.3 10*3/uL (ref 0.1–1.0)
Monocytes Relative: 7 % (ref 3–12)
Neutro Abs: 2.9 10*3/uL (ref 1.7–7.7)

## 2011-11-09 LAB — URINE MICROSCOPIC-ADD ON

## 2011-11-09 LAB — COMPREHENSIVE METABOLIC PANEL
BUN: 30 mg/dL — ABNORMAL HIGH (ref 6–23)
CO2: 25 mEq/L (ref 19–32)
Calcium: 9.8 mg/dL (ref 8.4–10.5)
Chloride: 99 mEq/L (ref 96–112)
Creatinine, Ser: 0.83 mg/dL (ref 0.50–1.10)
GFR calc Af Amer: 87 mL/min — ABNORMAL LOW (ref >90)
GFR calc non Af Amer: 75 mL/min — ABNORMAL LOW (ref >90)
Glucose, Bld: 116 mg/dL — ABNORMAL HIGH (ref 70–99)
Total Bilirubin: 0.5 mg/dL (ref 0.3–1.2)

## 2011-11-09 LAB — CBC
HCT: 33 % — ABNORMAL LOW (ref 36.0–46.0)
Hemoglobin: 11.4 g/dL — ABNORMAL LOW (ref 12.0–15.0)
MCV: 75.2 fL — ABNORMAL LOW (ref 78.0–100.0)
RDW: 14.1 % (ref 11.5–15.5)
WBC: 4.2 10*3/uL (ref 4.0–10.5)

## 2011-11-09 LAB — URINALYSIS, ROUTINE W REFLEX MICROSCOPIC
Nitrite: NEGATIVE
Specific Gravity, Urine: 1.015 (ref 1.005–1.030)
Urobilinogen, UA: 0.2 mg/dL (ref 0.0–1.0)

## 2011-11-09 LAB — LIPASE, BLOOD: Lipase: 41 U/L (ref 11–59)

## 2011-11-09 MED ORDER — SODIUM CHLORIDE 0.9 % IV SOLN
INTRAVENOUS | Status: DC
  Administered 2011-11-09: 1000 mL via INTRAVENOUS

## 2011-11-09 MED ORDER — HEPARIN SOD (PORK) LOCK FLUSH 100 UNIT/ML IV SOLN
INTRAVENOUS | Status: AC
  Administered 2011-11-09: 500 [IU] via INTRAVENOUS
  Filled 2011-11-09: qty 5

## 2011-11-09 MED ORDER — DEXTROSE 5 % IV SOLN
1.0000 g | Freq: Once | INTRAVENOUS | Status: AC
  Administered 2011-11-09: 1 g via INTRAVENOUS
  Filled 2011-11-09: qty 10

## 2011-11-09 MED ORDER — CEPHALEXIN 500 MG PO CAPS: 500.0000 mg | ORAL_CAPSULE | Freq: Three times a day (TID) | ORAL | Status: AC

## 2011-11-09 MED ORDER — MORPHINE SULFATE 4 MG/ML IJ SOLN
4.0000 mg | Freq: Once | INTRAMUSCULAR | Status: AC
  Administered 2011-11-09: 4 mg via INTRAVENOUS
  Filled 2011-11-09: qty 1

## 2011-11-09 MED ORDER — SODIUM CHLORIDE 0.9 % IV BOLUS (SEPSIS)
1000.0000 mL | Freq: Once | INTRAVENOUS | Status: AC
  Administered 2011-11-09: 1000 mL via INTRAVENOUS

## 2011-11-09 MED ORDER — PANTOPRAZOLE SODIUM 40 MG IV SOLR
40.0000 mg | Freq: Once | INTRAVENOUS | Status: AC
  Administered 2011-11-09: 40 mg via INTRAVENOUS
  Filled 2011-11-09: qty 40

## 2011-11-09 MED ORDER — METOCLOPRAMIDE HCL 5 MG/ML IJ SOLN
10.0000 mg | Freq: Once | INTRAMUSCULAR | Status: AC
  Administered 2011-11-09: 10 mg via INTRAVENOUS
  Filled 2011-11-09: qty 2

## 2011-11-09 NOTE — ED Provider Notes (Signed)
History  This chart was scribed for Donnetta Hutching, MD by Bennett Scrape. This patient was seen in room APA03/APA03 and the patient's care was started at 3:39PM.  CSN: [id number]  Arrival date & time 11/09/11  1523   First MD Initiated Contact with Patient 11/09/11 1539      Chief Complaint  Patient presents with  . Abdominal Pain    The history is provided by the patient. No language interpreter was used.    [name] is a 61 y.o. female who presents to the Emergency Department complaining of 12 hours of gradual onset, gradually improving, constant generalized abdominal pain. Pt reports several episodes of emesis today which she states has improved the abdominal pain. She also reports that she has been taking oxycodone pills for the past 5 days "all day long" but states that they did not improve her symptoms today. Pt reports that she has a small bowel obstruction and is on TPN 12 hours a day at night. She states that she has been eating "small and creamy stuff" for the past few weeks. She denies eating today. Pt is also undergoing treatment for bladder CA. Pt has a port-o-cath left over from chemotherapy. Pt reports that she was hospitalized at [facility] last week for 2 days for UTI and was treated with Cipro and doxycycline. She states that she had a Ct scan during the hospitalization and was told that bowel obstruction was getting better.  Daughter did a "urine strip" on pt 2 days ago and states that the UTI was still there. Pt is not currently on any antibiotic.She denies any other symptoms such as fever, HA, rash and cough. She also has a h/o chronic back pain, HTN and thyroid disease. She is a former smoker but denies alcohol use.  Seen at [facility] for the bowel obstruction. Seen at [facility] for the bladder CA. Dr. Lodema Hong is PCP.   Past Medical History  Diagnosis Date  . Bowel obstruction several  . Cancer bladder  . Thyroid disease   . Acid reflux   . Chronic back pain   .  Hyperlipidemia   . Hypertension     pt says taken off medication since has lost weight.    Past Surgical History  Procedure Date  . Back surgery   . Hernia repair   . Abdominal surgery   . Cholecystectomy     Family History  Problem Relation Age of Onset  . Heart disease Mother     enlarged  . Hypertension Mother   . Diabetes Father   . Diabetes Sister   . Diabetes Maternal Grandmother     History  Substance Use Topics  . Smoking status: Former Smoker    Quit date: 09/26/2010  . Smokeless tobacco: Not on file  . Alcohol Use: No     Review of Systems  A complete 10 system review of systems was obtained and all systems are negative except as noted in the HPI and PMH.   Allergies  Codeine; Nitrofuran derivatives; and Sulfa antibiotics  Home Medications   Current Outpatient Rx  Name Route Sig Dispense Refill  . RA SINUS TABLETS EX ST PO Oral Take 1 tablet by mouth 2 (two) times daily as needed. For sinus relief    . DEXLANSOPRAZOLE 60 MG PO CPDR Oral Take 60 mg by mouth daily.      Marland Kitchen DOCUSATE SODIUM 100 MG PO CAPS Oral Take 100 mg by mouth 2 (two) times daily.     Marland Kitchen  LEVOTHYROXINE SODIUM 125 MCG PO TABS Oral Take 125 mcg by mouth daily.    . ADULT MULTIVITAMIN W/MINERALS CH Oral Take 1 tablet by mouth every other day. On Tuesdays, Thursdays, Saturdays, and Sundays    . OXYCODONE HCL 5 MG PO TABS Oral Take 1 tablet (5 mg total) by mouth every 6 (six) hours as needed. 40 tablet 0  . POLYETHYLENE GLYCOL 3350 PO POWD  MIX 1 CAPFUL IN LIQUID AS DIRECTED AND TAKE DAILY 527 g 5  . SENNOSIDES 8.6 MG PO TABS Oral Take 1 tablet by mouth 2 (two) times daily.     . TPN NICU Intravenous Inject into the vein continuous. For 12 hours daily from 8:30pm or 8:30pm or 9:30am to 9:30am Only Sundays, Mondays, Wednesdays, and Fridays      Triage Vitals: BP 123/86  Pulse 88  Temp(Src) 98 F (36.7 C) (Oral)  Resp 18  SpO2 100%  Physical Exam  Nursing note and vitals  reviewed. Constitutional: She is oriented to person, place, and time. She appears well-developed and well-nourished.  HENT:  Head: Normocephalic and atraumatic.  Eyes: Conjunctivae and EOM are normal. Pupils are equal, round, and reactive to light.  Neck: Normal range of motion. Neck supple.  Cardiovascular: Normal rate and regular rhythm.   Pulmonary/Chest: Effort normal and breath sounds normal.  Abdominal: Soft. Bowel sounds are normal. There is tenderness (mild diffuse tenderness).  Musculoskeletal: Normal range of motion. She exhibits no edema.  Neurological: She is alert and oriented to person, place, and time.  Skin: Skin is warm and dry.  Psychiatric: She has a normal mood and affect. Her behavior is normal.    ED Course  Procedures (including critical care time)  DIAGNOSTIC STUDIES: Oxygen Saturation is 100% on room air, normal by my interpretation.    COORDINATION OF CARE: 4:03PM-Discussed urinalysis, blood work, IV fluids and 3-way abdominal x-ray with pt and pt agreed to plan. 7:01PM-Advised pt that she still appears to have an UTI. Discussed IV antibiotics with pt and pt agreed to plan. After IV antibiotics, pt will be discharged home.  Labs Reviewed  CBC - Abnormal; Notable for the following:    Hemoglobin 11.4 (*)    HCT 33.0 (*)    MCV 75.2 (*)    All other components within normal limits  COMPREHENSIVE METABOLIC PANEL - Abnormal; Notable for the following:    Sodium 133 (*)    Glucose, Bld 116 (*)    BUN 30 (*)    Alkaline Phosphatase 153 (*)    GFR calc non Af Amer 75 (*)    GFR calc Af Amer 87 (*)    All other components within normal limits  URINALYSIS, ROUTINE W REFLEX MICROSCOPIC - Abnormal; Notable for the following:    Hgb urine dipstick SMALL (*)    Protein, ur TRACE (*)    Leukocytes, UA LARGE (*)    All other components within normal limits  URINE MICROSCOPIC-ADD ON - Abnormal; Notable for the following:    Bacteria, UA MANY (*)    All other  components within normal limits  DIFFERENTIAL  LIPASE, BLOOD   Dg Abd Acute W/chest  11/09/2011  *RADIOLOGY REPORT*  Clinical Data: Rule out small bowel obstruction  ACUTE ABDOMEN SERIES (ABDOMEN 2 VIEW & CHEST 1 VIEW)  Comparison: 09/27/2011 and 11/14/2010  Findings: Cardiomediastinal silhouette is unremarkable.  No acute infiltrate or pulmonary edema.  Right IJ Port-A-Cath with tip in right atrium.  There is a left arm PICC  line with tip in SVC.  Mild distended bowel loops are noted mid upper abdomen with some air fluid levels suspicious for ileus or early bowel obstruction. There is a paucity of bowel gas and lower abdomen. No free abdominal air is noted.  IMPRESSION: No acute disease within chest.  Mild distended bowel loops in the upper abdomen with some air fluid levels suspicious for ileus or early bowel obstruction.  Paucity of bowel gas and lower abdomen.  Original Report Authenticated By: Natasha Mead, M.D.     No diagnosis found.    MDM  Patient has known chronic small bowel obstruction. Feels much better today after vomiting. Her color is good and she has no acute abdomen. Urinary tract infection noted on urinalysis. IV Rocephin given. Urine culture. Discharge home on Keflex.      I personally performed the services described in this documentation, which was scribed in my presence. The recorded information has been reviewed and considered.    Donnetta Hutching, MD 11/09/11 2048

## 2011-11-09 NOTE — ED Notes (Signed)
Family at bedside. Patient does not need anything at this time. RN Mardene Celeste at bedside.

## 2011-11-09 NOTE — Discharge Instructions (Signed)
Urinary Tract Infection A urinary tract infection (UTI) is often caused by a germ (bacteria). A UTI is usually helped with medicine (antibiotics) that kills germs. Take all the medicine until it is gone. Do this even if you are feeling better. You are usually better in 7 to 10 days. HOME CARE   Drink enough water and fluids to keep your pee (urine) clear or pale yellow. Drink:   Cranberry juice.   Water.   Avoid:   Caffeine.   Tea.   Bubbly (carbonated) drinks.   Alcohol.   Only take medicine as told by your doctor.   To prevent further infections:   Pee often.   After pooping (bowel movement), women should wipe from front to back. Use each tissue only once.   Pee before and after having sex (intercourse).  Ask your doctor when your test results will be ready. Make sure you follow up and get your test results.  GET HELP RIGHT AWAY IF:   There is very bad back pain or lower belly (abdominal) pain.   You get the chills.   You have a fever.   Your baby is older than 3 months with a rectal temperature of 102 F (38.9 C) or higher.   Your baby is 44 months old or younger with a rectal temperature of 100.4 F (38 C) or higher.   You feel sick to your stomach (nauseous) or throw up (vomit).   There is continued burning with peeing.   Your problems are not better in 3 days. Return sooner if you are getting worse.  MAKE SURE YOU:   Understand these instructions.   Will watch your condition.   Will get help right away if you are not doing well or get worse.  Document Released: 12/04/2007 Document Revised: 06/06/2011 Document Reviewed: 12/04/2007 [facility] Patient Information 2012 [place], [place].   You have a urinary tract infection. Antibiotic for same. Increase fluids. You still have evidence of a slight bowel obstruction. Continue TPN

## 2011-11-11 NOTE — Telephone Encounter (Signed)
Called and left message for Rhonda to call me back.

## 2011-11-12 ENCOUNTER — Telehealth: Payer: Self-pay | Admitting: Family Medicine

## 2011-11-12 LAB — URINE CULTURE
Colony Count: >=100000
Culture  Setup Time: 201305122135

## 2011-11-12 NOTE — Telephone Encounter (Signed)
Please call pt and verify what she needs exactly as there was a lot of confusion the last time. Does she need her picc line or the port a cath flushed. Her home health services- RN already has orders to flush her picc line.

## 2011-11-14 NOTE — Telephone Encounter (Signed)
The [facility] nurse does flush it but she couldn't get a blood return so now she needs an order to go to the hospital to have it unclogged. States she has been waiting on this and needs it done asap.

## 2011-11-14 NOTE — Telephone Encounter (Signed)
Order written if this can not be done at the cancer center to day, then she needs to go to the ER to have this flushed so she can run her TPN.

## 2011-11-15 NOTE — Telephone Encounter (Signed)
Courtney spoke with patient

## 2011-11-21 ENCOUNTER — Encounter: Payer: Self-pay | Admitting: Family Medicine

## 2011-11-21 ENCOUNTER — Ambulatory Visit (INDEPENDENT_AMBULATORY_CARE_PROVIDER_SITE_OTHER): Payer: Medicare Other | Admitting: Family Medicine

## 2011-11-21 VITALS — BP 118/78 | HR 93 | Resp 18 | Ht 65.5 in | Wt 159.0 lb

## 2011-11-21 DIAGNOSIS — R112 Nausea with vomiting, unspecified: Secondary | ICD-10-CM

## 2011-11-21 DIAGNOSIS — I10 Essential (primary) hypertension: Secondary | ICD-10-CM

## 2011-11-21 DIAGNOSIS — R197 Diarrhea, unspecified: Secondary | ICD-10-CM

## 2011-11-21 DIAGNOSIS — N3090 Cystitis, unspecified without hematuria: Secondary | ICD-10-CM

## 2011-11-21 DIAGNOSIS — R109 Unspecified abdominal pain: Secondary | ICD-10-CM

## 2011-11-21 DIAGNOSIS — N390 Urinary tract infection, site not specified: Secondary | ICD-10-CM

## 2011-11-21 MED ORDER — OXYCODONE HCL 5 MG PO TABS: 5.0000 mg | ORAL_TABLET | Freq: Four times a day (QID) | ORAL | Status: DC | PRN

## 2011-11-21 NOTE — Assessment & Plan Note (Signed)
Acute on chronic abd pain, but pt does not feel this is typical of her SBO as she has stools. Will check for C diff with multiple antibiotics given for urine infection, she is to hold off on solid foods as this is causing emesis

## 2011-11-21 NOTE — Progress Notes (Signed)
  Subjective:    Patient ID: [name], female    DOB: 06/06/1951, 61 y.o.   MRN: [id number]  HPI  Pt here to f/u ED visit, she was admitted to [facility] 1st- 3rd, with abd pain and N/V found to have UTI, she went home and symptoms of pain and N/V re-occurred seen in ED 1 week ago and found to have recurrent UTI given Rocephin IM and sent home on keflex.Her urine continues to be cloudy.  She has been having some problems with her PICC line until today, she has been able to run her TPN but they had problems getting blood back   Review of Systems - per above  GEN- denies fatigue, fever, weight loss,weakness, recent illness HEENT- denies eye drainage, change in vision, nasal discharge, CVS- denies chest pain, palpitations RESP- denies SOB, cough, wheeze ABD- + N/V, +change in stools, +abd pain GU- denies dysuria, hematuria, dribbling, incontinence MSK- + joint pain, muscle aches, injury Neuro- denies headache, dizziness, syncope, seizure activity       Objective:   Physical Exam GEN- NAD, alert and oriented x3 HEENT- non icteric, MMM, oropharynx clear CVS- RRR, no murmur RESP-CTAB ABD-ND, NABS, non tender, ostomy in RLQ with urine bag attached d/c/i- urinecloudy  yelllow, abdomen TTP diffusely EXT- No edema Pulses- Radial, DP- 2+ Right chest wall- port-a- cath, picc line in left arm          Assessment & Plan:

## 2011-11-21 NOTE — Patient Instructions (Signed)
Continue your current medications Try the phenergan or Zofran, reglan if you have these at home  Do not try any solid foods right now Bring the stool samples and urine sample back  Pain meds refilled  F/U 3 months

## 2011-11-21 NOTE — Assessment & Plan Note (Signed)
Bp at goal

## 2011-11-21 NOTE — Assessment & Plan Note (Signed)
Recurrent cystitis which typically comes in the form of cloudy dark urine as well as abd pain . Will have her bring in a sample after changing her bag to see if I can get a clean culture, multiple morphotypes from hospital culture

## 2011-11-26 ENCOUNTER — Telehealth: Payer: Self-pay | Admitting: Family Medicine

## 2011-11-26 NOTE — Telephone Encounter (Signed)
Pt aware.

## 2011-11-26 NOTE — Telephone Encounter (Signed)
She can drink either one, We will call her back when her results come in

## 2011-11-27 LAB — URINE CULTURE

## 2011-12-02 ENCOUNTER — Ambulatory Visit: Payer: Medicare Other | Admitting: Family Medicine

## 2011-12-05 ENCOUNTER — Encounter: Payer: Self-pay | Admitting: Family Medicine

## 2011-12-05 ENCOUNTER — Inpatient Hospital Stay (HOSPITAL_COMMUNITY)
Admission: AD | Admit: 2011-12-05 | Discharge: 2011-12-07 | DRG: 690 | Disposition: A | Discharge status: Home Health | Payer: Medicare Other | Source: Ambulatory Visit | Attending: Internal Medicine | Admitting: Internal Medicine

## 2011-12-05 ENCOUNTER — Ambulatory Visit (INDEPENDENT_AMBULATORY_CARE_PROVIDER_SITE_OTHER): Payer: Medicare Other | Admitting: Family Medicine

## 2011-12-05 ENCOUNTER — Inpatient Hospital Stay (HOSPITAL_COMMUNITY): Payer: Medicare Other

## 2011-12-05 ENCOUNTER — Encounter (HOSPITAL_COMMUNITY): Payer: Self-pay | Admitting: Internal Medicine

## 2011-12-05 VITALS — BP 106/72 | HR 83 | Resp 18 | Ht 65.5 in | Wt 161.0 lb

## 2011-12-05 DIAGNOSIS — F17200 Nicotine dependence, unspecified, uncomplicated: Secondary | ICD-10-CM

## 2011-12-05 DIAGNOSIS — Z906 Acquired absence of other parts of urinary tract: Secondary | ICD-10-CM

## 2011-12-05 DIAGNOSIS — E039 Hypothyroidism, unspecified: Secondary | ICD-10-CM

## 2011-12-05 DIAGNOSIS — Z923 Personal history of irradiation: Secondary | ICD-10-CM

## 2011-12-05 DIAGNOSIS — Z79899 Other long term (current) drug therapy: Secondary | ICD-10-CM

## 2011-12-05 DIAGNOSIS — R197 Diarrhea, unspecified: Secondary | ICD-10-CM

## 2011-12-05 DIAGNOSIS — R7989 Other specified abnormal findings of blood chemistry: Secondary | ICD-10-CM

## 2011-12-05 DIAGNOSIS — M545 Low back pain, unspecified: Secondary | ICD-10-CM

## 2011-12-05 DIAGNOSIS — R1013 Epigastric pain: Secondary | ICD-10-CM

## 2011-12-05 DIAGNOSIS — K56609 Unspecified intestinal obstruction, unspecified as to partial versus complete obstruction: Secondary | ICD-10-CM

## 2011-12-05 DIAGNOSIS — R112 Nausea with vomiting, unspecified: Secondary | ICD-10-CM

## 2011-12-05 DIAGNOSIS — R109 Unspecified abdominal pain: Secondary | ICD-10-CM

## 2011-12-05 DIAGNOSIS — D72819 Decreased white blood cell count, unspecified: Secondary | ICD-10-CM

## 2011-12-05 DIAGNOSIS — K219 Gastro-esophageal reflux disease without esophagitis: Secondary | ICD-10-CM | POA: Diagnosis present

## 2011-12-05 DIAGNOSIS — Z789 Other specified health status: Secondary | ICD-10-CM

## 2011-12-05 DIAGNOSIS — Z9221 Personal history of antineoplastic chemotherapy: Secondary | ICD-10-CM

## 2011-12-05 DIAGNOSIS — I10 Essential (primary) hypertension: Secondary | ICD-10-CM

## 2011-12-05 DIAGNOSIS — B9689 Other specified bacterial agents as the cause of diseases classified elsewhere: Secondary | ICD-10-CM | POA: Diagnosis present

## 2011-12-05 DIAGNOSIS — E782 Mixed hyperlipidemia: Secondary | ICD-10-CM

## 2011-12-05 DIAGNOSIS — Z87440 Personal history of urinary (tract) infections: Secondary | ICD-10-CM

## 2011-12-05 DIAGNOSIS — E785 Hyperlipidemia, unspecified: Secondary | ICD-10-CM

## 2011-12-05 DIAGNOSIS — N390 Urinary tract infection, site not specified: Secondary | ICD-10-CM

## 2011-12-05 DIAGNOSIS — R630 Anorexia: Secondary | ICD-10-CM | POA: Diagnosis present

## 2011-12-05 DIAGNOSIS — Z8551 Personal history of malignant neoplasm of bladder: Secondary | ICD-10-CM

## 2011-12-05 DIAGNOSIS — Z6826 Body mass index (BMI) 26.0-26.9, adult: Secondary | ICD-10-CM

## 2011-12-05 DIAGNOSIS — M129 Arthropathy, unspecified: Secondary | ICD-10-CM

## 2011-12-05 DIAGNOSIS — Z936 Other artificial openings of urinary tract status: Secondary | ICD-10-CM

## 2011-12-05 DIAGNOSIS — D6489 Other specified anemias: Secondary | ICD-10-CM | POA: Diagnosis present

## 2011-12-05 DIAGNOSIS — K760 Fatty (change of) liver, not elsewhere classified: Secondary | ICD-10-CM

## 2011-12-05 DIAGNOSIS — E440 Moderate protein-calorie malnutrition: Secondary | ICD-10-CM

## 2011-12-05 HISTORY — DX: Hypothyroidism, unspecified: E03.9

## 2011-12-05 HISTORY — DX: Unspecified protein-calorie malnutrition: E46

## 2011-12-05 HISTORY — DX: Decreased white blood cell count, unspecified: D72.819

## 2011-12-05 HISTORY — DX: Urinary tract infection, site not specified: N39.0

## 2011-12-05 LAB — DIFFERENTIAL
Basophils Absolute: 0 10*3/uL (ref 0.0–0.1)
Eosinophils Absolute: 0.1 10*3/uL (ref 0.0–0.7)
Eosinophils Relative: 3 % (ref 0–5)
Monocytes Absolute: 0.4 10*3/uL (ref 0.1–1.0)

## 2011-12-05 LAB — COMPREHENSIVE METABOLIC PANEL
ALT: 53 U/L — ABNORMAL HIGH (ref 0–35)
Alkaline Phosphatase: 168 U/L — ABNORMAL HIGH (ref 39–117)
CO2: 24 mEq/L (ref 19–32)
Chloride: 103 mEq/L (ref 96–112)
GFR calc Af Amer: 90 mL/min — ABNORMAL LOW (ref >90)
GFR calc non Af Amer: 77 mL/min — ABNORMAL LOW (ref >90)
Glucose, Bld: 87 mg/dL (ref 70–99)
Potassium: 3.8 mEq/L (ref 3.5–5.1)
Sodium: 137 mEq/L (ref 135–145)
Total Bilirubin: 0.6 mg/dL (ref 0.3–1.2)

## 2011-12-05 LAB — URINE MICROSCOPIC-ADD ON

## 2011-12-05 LAB — URINALYSIS, ROUTINE W REFLEX MICROSCOPIC
Bilirubin Urine: NEGATIVE
Glucose, UA: NEGATIVE mg/dL
Ketones, ur: NEGATIVE mg/dL

## 2011-12-05 LAB — CBC
HCT: 30.9 % — ABNORMAL LOW (ref 36.0–46.0)
MCH: 27.3 pg (ref 26.0–34.0)
MCV: 75.2 fL — ABNORMAL LOW (ref 78.0–100.0)
Platelets: 221 10*3/uL (ref 150–400)
RDW: 14.5 % (ref 11.5–15.5)

## 2011-12-05 MED ORDER — PANTOPRAZOLE SODIUM 40 MG IV SOLR
40.0000 mg | INTRAVENOUS | Status: DC
  Administered 2011-12-06: 40 mg via INTRAVENOUS
  Filled 2011-12-05: qty 40

## 2011-12-05 MED ORDER — GUAIFENESIN-DM 100-10 MG/5ML PO SYRP: 5.0000 mL | ORAL_SOLUTION | ORAL | Status: DC | PRN

## 2011-12-05 MED ORDER — HYDROMORPHONE HCL PF 1 MG/ML IJ SOLN
1.0000 mg | INTRAMUSCULAR | Status: DC | PRN
  Administered 2011-12-05 – 2011-12-07 (×5): 1 mg via INTRAVENOUS
  Filled 2011-12-05 (×5): qty 1

## 2011-12-05 MED ORDER — LIDOCAINE-PRILOCAINE 2.5-2.5 % EX CREA
1.0000 "application " | TOPICAL_CREAM | CUTANEOUS | Status: DC | PRN
  Filled 2011-12-05: qty 5

## 2011-12-05 MED ORDER — ONDANSETRON HCL 4 MG/2ML IJ SOLN: 4.0000 mg | Freq: Four times a day (QID) | INTRAMUSCULAR | Status: DC | PRN

## 2011-12-05 MED ORDER — DEXLANSOPRAZOLE 60 MG PO CPDR: 60.0000 mg | DELAYED_RELEASE_CAPSULE | Freq: Every day | ORAL | Status: DC

## 2011-12-05 MED ORDER — KCL IN DEXTROSE-NACL 20-5-0.9 MEQ/L-%-% IV SOLN
INTRAVENOUS | Status: DC
  Administered 2011-12-05: 21:00:00 via INTRAVENOUS

## 2011-12-05 MED ORDER — ENOXAPARIN SODIUM 40 MG/0.4ML ~~LOC~~ SOLN: 40.0000 mg | SUBCUTANEOUS | Status: DC

## 2011-12-05 MED ORDER — OXYCODONE HCL 10 MG PO TABS: ORAL_TABLET | ORAL | Status: DC

## 2011-12-05 MED ORDER — DOCUSATE SODIUM 100 MG PO CAPS
100.0000 mg | ORAL_CAPSULE | Freq: Two times a day (BID) | ORAL | Status: DC
  Administered 2011-12-05 – 2011-12-07 (×4): 100 mg via ORAL
  Filled 2011-12-05 (×4): qty 1

## 2011-12-05 MED ORDER — LIDOCAINE-PRILOCAINE 2.5-2.5 % EX CREA
TOPICAL_CREAM | Freq: Once | CUTANEOUS | Status: AC
  Administered 2011-12-05: 1 via TOPICAL

## 2011-12-05 MED ORDER — ADULT MULTIVITAMIN W/MINERALS CH: 1.0000 | ORAL_TABLET | ORAL | Status: DC

## 2011-12-05 MED ORDER — LEVALBUTEROL HCL 0.63 MG/3ML IN NEBU: 0.6300 mg | INHALATION_SOLUTION | Freq: Four times a day (QID) | RESPIRATORY_TRACT | Status: DC | PRN

## 2011-12-05 MED ORDER — LEVOTHYROXINE SODIUM 100 MCG PO TABS
125.0000 ug | ORAL_TABLET | ORAL | Status: DC
  Administered 2011-12-06: 125 ug via ORAL
  Filled 2011-12-05: qty 1

## 2011-12-05 MED ORDER — ALUM & MAG HYDROXIDE-SIMETH 200-200-20 MG/5ML PO SUSP: 30.0000 mL | Freq: Four times a day (QID) | ORAL | Status: DC | PRN

## 2011-12-05 MED ORDER — ACETAMINOPHEN 325 MG PO TABS
650.0000 mg | ORAL_TABLET | Freq: Four times a day (QID) | ORAL | Status: DC | PRN
  Administered 2011-12-05: 650 mg via ORAL
  Filled 2011-12-05: qty 2

## 2011-12-05 MED ORDER — DEXTROSE 5 % IV SOLN
1.0000 g | Freq: Two times a day (BID) | INTRAVENOUS | Status: DC
  Administered 2011-12-05 – 2011-12-07 (×4): 1 g via INTRAVENOUS
  Filled 2011-12-05 (×8): qty 1

## 2011-12-05 MED ORDER — TRAVASOL 10 % IV SOLN
INTRAVENOUS | Status: AC
  Administered 2011-12-05: 23:00:00 via INTRAVENOUS

## 2011-12-05 MED ORDER — ACETAMINOPHEN 650 MG RE SUPP: 650.0000 mg | Freq: Four times a day (QID) | RECTAL | Status: DC | PRN

## 2011-12-05 MED ORDER — SENNOSIDES-DOCUSATE SODIUM 8.6-50 MG PO TABS
1.0000 | ORAL_TABLET | Freq: Two times a day (BID) | ORAL | Status: DC
Start: 2011-12-05 — End: 2011-12-07
  Administered 2011-12-05 – 2011-12-07 (×4): 1 via ORAL
  Filled 2011-12-05 (×4): qty 1

## 2011-12-05 MED ORDER — SODIUM CHLORIDE 0.9 % IJ SOLN
INTRAMUSCULAR | Status: AC
  Administered 2011-12-05: 23:00:00
  Filled 2011-12-05: qty 3

## 2011-12-05 NOTE — Progress Notes (Signed)
Pt has 3 TPN bags that were compounded by [facility] infusion services and pt wants to use those bags.  Pt receives bags at home over 12 hr period at night (cyclic TPN).  While hospitalized, we will infuse each bag over 24 hrs (124ml/hr).  We will monitor labs per our routine TPN protocol and if pt still in hospital after her supply runs out we will transition to hospital formulary product.  Scharlene Gloss, PharmD

## 2011-12-05 NOTE — Assessment & Plan Note (Signed)
This does not appear to be classic of her typical bowel obstructions, likley related to her urine infection. Admission for IV fluids, she is on TPN at bedtime and will bring in from home. Anti-emetics to be given as needed

## 2011-12-05 NOTE — Assessment & Plan Note (Signed)
Loose stools have improved some,goal is to have fairly soft stools, she can cut back on the senna kot Stool labs negative

## 2011-12-05 NOTE — Assessment & Plan Note (Signed)
Persistent pain, stool cultures negative- C diff neg x 1 as outpatient. Treat UTI per above

## 2011-12-05 NOTE — Assessment & Plan Note (Signed)
Urine culture shows enterobacter which is resistant to multiple antibiotics, it is sensitive to cefepime , gentamicin and imipenem. Initial plan was to set up Gentamicin, with pt continued N/V which I think may be attributed to her urine infection being untreated with previous oral antibiotics. I did not choose cefepime initially secondary to all other cephalosporins being resistant. Discussed with admitting physician, will admit for IV antibiotics, will try cefepime first then Gentamicin if needed.

## 2011-12-05 NOTE — Assessment & Plan Note (Signed)
Chronic back and abd pain with multiple surgeries, will increase oxycodone to 10mg  as needed, she only uses as needed not daily

## 2011-12-05 NOTE — Progress Notes (Signed)
Addended by: Milinda Antis F on: 12/05/2011 03:14 PM   Modules accepted: Orders

## 2011-12-05 NOTE — Assessment & Plan Note (Signed)
Blood pressure stable, no meds

## 2011-12-05 NOTE — Assessment & Plan Note (Signed)
Ostomy in place, will watch renal function with antibiotics

## 2011-12-05 NOTE — Patient Instructions (Signed)
Please go straight to admitting, Dr. Sherrie Mustache will be paged once you arrive I will increase your pain medication to Oxycodone 10mg  every 6 hours as needed for pain Hold your senakot or take once a day if your bowels are too loose Antibiotics will be given and you will be sent home with them.

## 2011-12-05 NOTE — Assessment & Plan Note (Signed)
Thyroid studies need to be rechecked, I can have HH draw this with her labs for next week

## 2011-12-05 NOTE — H&P (Addendum)
[name] MRN: [id number] DOB/AGE: Apr 30, 1951 61 y.o. Primary Care Physician:[name], [name], MD, MD Admit date: 12/05/2011 Chief Complaint: Recurrent abdominal pain, nausea, and vomiting. HPI: The patient is a 61 year old woman with a history significant for recurrent small bowel obstructions, bladder cancer, status post ileal conduit, chronic nausea and vomiting, and recurrent urinary tract infections. She presents as a direct admission from Dr. [name] office for treatment of a urinary tract infection. The patient was recently treated with both Cipro and Keflex for an urinary tract infection. She was actually hospitalized at [facility] in May for nausea, vomiting, and abdominal pain. She was ultimately treated for urinary tract infection. Her symptoms improved, but then returned. She has had chronic intermittent nausea and vomiting, usually associated with partial or complete bowel obstructions and urinary tract infections. Her nausea and vomiting had subsided for several weeks, until she ate part of an egg biscuit. Following this, she had 2 episodes of nausea and vomiting over the past 2 days. She is on TPN chronically for malnutrition and chronic nausea and vomiting. She has been on TPN since August of 2012. She denies coffee grounds emesis or bright red blood in her emesis. She has crampy abdominal pain and it has been intermittent for the past several days. She has had multiple loose stools, but she attributes this to chronic laxative therapy to prevent recurrent bowel obstructions. She denies black tarry stools or bright red blood per rectum. She denies fever and chills. Recent stool studies by Dr. [name] were negative. The recent outpatient urinalysis and urine culture revealed Enterobacter. Dr. [name] had planned to start gentamicin because of the multiple resistances, however, it was noted that the bacteria were sensitive to cefepime. The patient is being admitted for further  evaluation and management.  Past Medical History  Diagnosis Date  . Bowel obstruction several    Recurrent SBO secondary to adhesions  . Hypothyroidism   . Acid reflux   . Chronic back pain   . Hyperlipidemia   . Hypertension     pt says taken off medication since has lost weight.  . Cancer 2011 [facility])    Invasive High grade Urothelial carcinoma- s/p radiation and Chemo   . UTI (lower urinary tract infection)     Recurrent  . Malnutrition     Past Surgical History  Procedure Date  . Back surgery   . Hernia repair   . Abdominal surgery   . Cholecystectomy   . Ileo conduit     For bladder cancer  . Cystectomy     Prior to Admission medications   Medication Sig Start Date End Date Taking? Authorizing Provider  dexlansoprazole (DEXILANT) 60 MG capsule Take 60 mg by mouth every morning. 12/05/11  Yes Salley Scarlet, MD  docusate sodium (COLACE) 100 MG capsule Take 100 mg by mouth 2 (two) times daily.    Yes Historical Provider, MD  levothyroxine (SYNTHROID, LEVOTHROID) 125 MCG tablet Take 125 mcg by mouth every morning.    Yes Historical Provider, MD  lidocaine-prilocaine (EMLA) cream Apply 1 application topically as needed. Apply as directed prior to port access.   Yes Historical Provider, MD  Multiple Vitamin (MULITIVITAMIN WITH MINERALS) TABS Take 1 tablet by mouth every other day. On Tuesdays, Thursdays, Saturdays, and Sundays. **Administered at bedtime with TPN administration   Yes Historical Provider, MD  Oxycodone HCl 10 MG TABS Take 10 mg by mouth every 4 (four) hours as needed. for pain 12/05/11  Yes [name]  Clinch, MD  polyethylene glycol powder (GLYCOLAX/MIRALAX) powder MIX 1 CAPFUL IN LIQUID AS DIRECTED AND TAKE DAILY 11/06/11  Yes Salley Scarlet, MD  senna-docusate (SENOKOT-S) 8.6-50 MG per tablet Take 1 tablet by mouth 2 (two) times daily.    Yes Historical Provider, MD  TPN NICU Inject into the vein continuous. Formula copy place in paper chart.   Lipids  daily due to use of 3 in 1 bag.    Yes Historical Provider, MD    Allergies:  Allergies  Allergen Reactions  . Codeine Hives  . Nitrofuran Derivatives Itching  . Penicillins     Hives  . Sulfa Antibiotics Hives    Family History  Problem Relation Age of Onset  . Heart disease Mother     enlarged  . Hypertension Mother   . Diabetes Father   . Diabetes Sister   . Diabetes Maternal Grandmother     Social History: She is widowed. She lives in [place]. She has 2 children. She stopped smoking a year and a half ago. She denies alcohol and illicit drug use. She still drives. She is receiving disability benefits. There is a [employer] and home health services who come visit her weekly for followup with TPN and laboratory studies.           ROS: As above. Otherwise review of systems is negative.  PHYSICAL EXAM: Blood pressure 119/78, pulse 70, temperature 95.5 F (35.3 C), temperature source Oral, resp. rate 20, height 5' 5.5" (1.664 m), weight 73.029 kg (161 lb), SpO2 100.00%.  General: Pleasant 61 year old after American woman, lying in bed, in no acute distress. HEENT: Head is normocephalic, nontraumatic. Pupils are equal, round, reactive to light. Extraocular movements are intact. Conjunctivae are clear. Sclerae are white. Oropharynx reveals mildly dry mucous membranes. No posterior exudates or erythema. Neck: Supple, no adenopathy, no thyromegaly, no JVD. Lungs: Clear anteriorly with decreased breath sounds in the bases. Heart: S1, S2, with a soft systolic murmur. Chest wall: Port-A-Cath noted with no surrounding erythema or tenderness. Abdomen: Ileal conduit with clear yellow urine noted at the right mid abdomen. Well-healed vertical scar. Abdomen is mildly distended and mildly obese. Mild to moderate tenderness in the epigastrium. No masses palpated. Extremities: No pedal edema. Pedal pulses palpable. Neurologic: She is alert and oriented x3. Cranial nerves II  through XII are intact. Strength is 5 over 5 throughout. PICC line noted at the proximal left arm.   Basic Metabolic Panel: No results found for this basename: NA:2,K:2,CL:2,CO2:2,GLUCOSE:2,BUN:2,CREATININE:2,CALCIUM:2,MG:2,PHOS:2 in the last 72 hours Liver Function Tests: No results found for this basename: AST:2,ALT:2,ALKPHOS:2,BILITOT:2,PROT:2,ALBUMIN:2 in the last 72 hours No results found for this basename: LIPASE:2,AMYLASE:2 in the last 72 hours No results found for this basename: AMMONIA:2 in the last 72 hours CBC: No results found for this basename: WBC:2,NEUTROABS:2,HGB:2,HCT:2,MCV:2,PLT:2 in the last 72 hours Cardiac Enzymes: No results found for this basename: CKTOTAL:3,CKMB:3,CKMBINDEX:3,TROPONINI:3 in the last 72 hours BNP: No results found for this basename: PROBNP:3 in the last 72 hours D-Dimer: No results found for this basename: DDIMER:2 in the last 72 hours CBG: No results found for this basename: GLUCAP:6 in the last 72 hours Hemoglobin A1C: No results found for this basename: HGBA1C in the last 72 hours Fasting Lipid Panel: No results found for this basename: CHOL,HDL,LDLCALC,TRIG,CHOLHDL,LDLDIRECT in the last 72 hours Thyroid Function Tests: No results found for this basename: TSH,T4TOTAL,FREET4,T3FREE,THYROIDAB in the last 72 hours Anemia Panel: No results found for this basename: VITAMINB12,FOLATE,FERRITIN,TIBC,IRON,RETICCTPCT in the last 72 hours Coagulation: No  results found for this basename: LABPROT:2,INR:2 in the last 72 hours Urine Drug Screen: Drugs of Abuse  No results found for this basename: labopia,  cocainscrnur,  labbenz,  amphetmu,  thcu,  labbarb    Alcohol Level: No results found for this basename: ETH:2 in the last 72 hours Urinalysis: No results found for this basename: COLORURINE:2,APPERANCEUR:2,LABSPEC:2,PHURINE:2,GLUCOSEU:2,HGBUR:2,BILIRUBINUR:2,KETONESUR:2,PROTEINUR:2,UROBILINOGEN:2,NITRITE:2,LEUKOCYTESUR:2 in the last 72 hours Misc.  Labs:   No results found for this or any previous visit (from the past 240 hour(s)).   No results found for this or any previous visit (from the past 48 hour(s)).  Dg Abd 1 View  12/05/2011  *RADIOLOGY REPORT*  Clinical Data: 61 year old female with abdominal pain and nausea. Urothelial carcinoma status post radiation and chemotherapy. Possible obstruction.  ABDOMEN - 1 VIEW  Comparison: 11/09/2011.  Findings: Portable supine AP view 1715 hours.  There is a dilated gas filled bowel loop in the mid abdomen which is too low to be the stomach.  This is probably as severely dilated small bowel loop, similar to the findings on 09/27/2011.  Paucity of bowel gas elsewhere except for a small amount of gas suspected in the stomach. No definite pneumoperitoneum.  IMPRESSION: High-grade small bowel obstruction.  No free air is evident.  Original Report Authenticated By: Harley Hallmark, M.D.   Dg Chest Port 1 View  12/05/2011  *RADIOLOGY REPORT*  Clinical Data: 61 year old female with abdominal pain and nausea. Urothelial carcinoma status post radiation and chemotherapy.  PORTABLE CHEST - 1 VIEW  Comparison: 04/04/2011 and earlier.  Findings: Portable semi upright AP view 1710 hours.  Stable right chest Port-A-Cath.  Left side PICC line catheter now in place.  The PICC line tip is at the SVC level.  Chronically low lung volumes, lower than previous studies.  No pneumothorax, pulmonary edema, pleural effusion or consolidation.  Patchy/nodular asymmetric opacity at the left lung base.  Cardiac size and mediastinal contours are within normal limits.  Visualized tracheal air column is within normal limits.  IMPRESSION: Low lung volumes.  Asymmetric nodular opacity at the left lung base such that a developing left lung pneumonia cannot be excluded. Otherwise no acute cardiopulmonary abnormality identified.  Original Report Authenticated By: Harley Hallmark, M.D.   Dg Abd Acute W/chest  11/09/2011  *RADIOLOGY REPORT*   Clinical Data: Rule out small bowel obstruction  ACUTE ABDOMEN SERIES (ABDOMEN 2 VIEW & CHEST 1 VIEW)  Comparison: 09/27/2011 and 11/14/2010  Findings: Cardiomediastinal silhouette is unremarkable.  No acute infiltrate or pulmonary edema.  Right IJ Port-A-Cath with tip in right atrium.  There is a left arm PICC line with tip in SVC.  Mild distended bowel loops are noted mid upper abdomen with some air fluid levels suspicious for ileus or early bowel obstruction. There is a paucity of bowel gas and lower abdomen. No free abdominal air is noted.  IMPRESSION: No acute disease within chest.  Mild distended bowel loops in the upper abdomen with some air fluid levels suspicious for ileus or early bowel obstruction.  Paucity of bowel gas and lower abdomen.  Original Report Authenticated By: Natasha Mead, M.D.    Impression:  Principal Problem:  *Recurrent UTI Active Problems:  Abdominal  pain, other specified site  N&V (nausea and vomiting)  HYPOTHYROIDISM  SBO (small bowel obstruction)  Malnutrition of moderate degree   1. Recurrent abdominal pain, nausea, and vomiting. Her symptomatology was thought to be secondary to recurrent urinary tract infection. However, in the interim, an abdominal x-ray was ordered. It  reveals high-grade small bowel obstruction. She is no longer having nausea or vomiting. We'll hold off on inserting NG tube.  2. Recurrent small bowel obstruction. The patient was told that she is not an operative candidate due to severe scar tissue. She is having bowel movements.  3. Enterobacter urinary tract infection, per outpatient culture. It is sensitive to cefepime.  4. Malnutrition in the setting of chronic nausea, vomiting, and small bowel obstructions. We'll continue TPN as this is chronic.   Plan: 1. Will downgrade diet from full liquids to ice chips and sips of clear liquids as tolerated. 2. If  vomiting recurs, will  insert NG tube. 3. IV Protonix and when necessary IV  Zofran. 4. IV analgesics with hydromorphone as needed. 5. Continue home regimen of TPN. Will ask the pharmacist to assist with this. 6. We'll consult general surgery in the morning. 7. We'll order baseline laboratory studies. 8. Start IV cefepime.      Annamae Shivley 12/05/2011, 6:16 PM

## 2011-12-05 NOTE — Progress Notes (Addendum)
  Subjective:    Patient ID: [name], female    DOB: [birth year], 61 y.o.   MRN: [id number]  HPI  Pt presents to f/u abd pain and UTI. Urine culture showed enterobacter which was resistant to oral meds ( Keflex, cipro most recent antibiotics), I had planned to put her on IV gentatmicin but there has been some difficulty getting this and pt was afraid to take at home in a non hospital setting. Over the past week she has not been able to tolerate by mouth, she did try heavier foods than typical. She has had 1 episode of bilous emesis. She has also had fatigue and feels like she still has urine infection. Her daughter tested her urine at home with OTC strips.    Review of Systems    GEN- + fatigue, denies fever, weight loss,weakness, recent illness HEENT- denies eye drainage, change in vision, nasal discharge, CVS- denies chest pain, palpitations RESP- denies SOB, cough, wheeze ABD- + N/V, +change in stools, +abd pain GU- denies dysuria, hematuria, dribbling, incontinence MSK- + joint pain, muscle aches, injury Neuro- denies headache, dizziness, syncope, seizure activity    Objective:   Physical Exam GEN- NAD, alert and oriented x3 HEENT- perrl, EOMI, non icteric, MMM, oropharynx clear CVS- RRR, no murmur RESP-CTAB ABD- BS in all 4 quadrants, , ostomy in RLQ with urine bag attached d/c/i- urine yellow with few particles noted , abdomen TTP diffusely, no rebound, no guarding  EXT- No edema Pulses- Radial, DP- 2+ Right chest wall- port-a- cath, picc line in left arm         Assessment & Plan:

## 2011-12-06 ENCOUNTER — Inpatient Hospital Stay (HOSPITAL_COMMUNITY): Payer: Medicare Other

## 2011-12-06 ENCOUNTER — Encounter (HOSPITAL_COMMUNITY): Payer: Self-pay | Admitting: Internal Medicine

## 2011-12-06 DIAGNOSIS — D72819 Decreased white blood cell count, unspecified: Secondary | ICD-10-CM

## 2011-12-06 DIAGNOSIS — E782 Mixed hyperlipidemia: Secondary | ICD-10-CM

## 2011-12-06 DIAGNOSIS — K56609 Unspecified intestinal obstruction, unspecified as to partial versus complete obstruction: Secondary | ICD-10-CM

## 2011-12-06 DIAGNOSIS — E440 Moderate protein-calorie malnutrition: Secondary | ICD-10-CM

## 2011-12-06 HISTORY — DX: Decreased white blood cell count, unspecified: D72.819

## 2011-12-06 LAB — PHOSPHORUS: Phosphorus: 4.3 mg/dL (ref 2.3–4.6)

## 2011-12-06 LAB — CBC
HCT: 29.9 % — ABNORMAL LOW (ref 36.0–46.0)
Hemoglobin: 10.6 g/dL — ABNORMAL LOW (ref 12.0–15.0)
MCHC: 35.5 g/dL (ref 30.0–36.0)
MCV: 74.8 fL — ABNORMAL LOW (ref 78.0–100.0)

## 2011-12-06 LAB — COMPREHENSIVE METABOLIC PANEL
ALT: 42 U/L — ABNORMAL HIGH (ref 0–35)
Albumin: 2.9 g/dL — ABNORMAL LOW (ref 3.5–5.2)
Alkaline Phosphatase: 154 U/L — ABNORMAL HIGH (ref 39–117)
Calcium: 9.2 mg/dL (ref 8.4–10.5)
GFR calc Af Amer: 87 mL/min — ABNORMAL LOW (ref >90)
Glucose, Bld: 102 mg/dL — ABNORMAL HIGH (ref 70–99)
Potassium: 3.9 mEq/L (ref 3.5–5.1)
Sodium: 136 mEq/L (ref 135–145)
Total Protein: 5.8 g/dL — ABNORMAL LOW (ref 6.0–8.3)

## 2011-12-06 LAB — HIV ANTIBODY (ROUTINE TESTING W REFLEX): HIV: NONREACTIVE

## 2011-12-06 LAB — DIFFERENTIAL
Basophils Relative: 1 % (ref 0–1)
Eosinophils Absolute: 0.1 10*3/uL (ref 0.0–0.7)
Lymphs Abs: 1 10*3/uL (ref 0.7–4.0)
Neutro Abs: 1.3 10*3/uL — ABNORMAL LOW (ref 1.7–7.7)
Neutrophils Relative %: 48 % (ref 43–77)

## 2011-12-06 LAB — TRIGLYCERIDES: Triglycerides: 82 mg/dL (ref <150)

## 2011-12-06 LAB — VITAMIN B12: Vitamin B-12: 288 pg/mL (ref 211–911)

## 2011-12-06 LAB — MAGNESIUM: Magnesium: 1.7 mg/dL (ref 1.5–2.5)

## 2011-12-06 LAB — T3, FREE: T3, Free: 3.8 pg/mL (ref 2.3–4.2)

## 2011-12-06 LAB — TSH: TSH: 0.009 u[IU]/mL — ABNORMAL LOW (ref 0.350–4.500)

## 2011-12-06 MED ORDER — SODIUM CHLORIDE 0.9 % IJ SOLN
INTRAMUSCULAR | Status: AC
  Administered 2011-12-06: 08:00:00
  Filled 2011-12-06: qty 3

## 2011-12-06 MED ORDER — NONFORMULARY OR COMPOUNDED ITEM: Status: DC

## 2011-12-06 MED ORDER — BOOST / RESOURCE BREEZE PO LIQD
1.0000 | Freq: Two times a day (BID) | ORAL | Status: DC
  Administered 2011-12-07: 1 via ORAL
  Filled 2011-12-06 (×6): qty 1

## 2011-12-06 MED ORDER — SODIUM CHLORIDE 0.9 % IJ SOLN
INTRAMUSCULAR | Status: AC
  Administered 2011-12-06: 10 mL
  Filled 2011-12-06: qty 3

## 2011-12-06 MED ORDER — CEFEPIME HCL 1 G IJ SOLR
INTRAMUSCULAR | Status: AC
  Filled 2011-12-06: qty 1

## 2011-12-06 NOTE — Consult Note (Signed)
PARENTERAL NUTRITION CONSULT NOTE - FOLLOW UP Pharmacy Consult for TPN (currenly using pt's own bags from home, compounded at [facility] infusion services)  Indication: continuation of chronic TPN use at home  Allergies  Allergen Reactions  . Codeine Hives  . Nitrofuran Derivatives Itching  . Penicillins     Hives  . Sulfa Antibiotics Hives   Patient Measurements: Height: 5' 5.5" (166.4 cm) Weight: 161 lb (73.029 kg) IBW/kg (Calculated) : 58.15   Vital Signs: Temp: 98 F (36.7 C) (06/07 0537) Temp src: Oral (06/07 0537) BP: 102/66 mmHg (06/07 0537) Pulse Rate: 73  (06/07 0537) Intake/Output from previous day: 06/06 0701 - 06/07 0700 In: 1197.1 [I.V.:454.5; TPN:742.6] Out: 1000 [Urine:1000] Intake/Output from this shift:    Labs:  [facility] 12/06/11 0533 12/05/11 1900  WBC 2.8* 3.6*  HGB 10.6* 11.2*  HCT 29.9* 30.9*  PLT 195 221  APTT -- --  INR -- --    Basename 12/06/11 0534 12/06/11 0533 12/05/11 1900  NA -- 136 137  K -- 3.9 3.8  CL -- 103 103  CO2 -- 25 24  GLUCOSE -- 102* 87  BUN -- 21 28*  CREATININE -- 0.83 0.81  LABCREA -- -- --  CREAT24HRUR -- -- --  CALCIUM -- 9.2 9.6  MG -- 1.7 1.8  PHOS -- 4.3 4.3  PROT -- 5.8* 6.4  ALBUMIN -- 2.9* 3.1*  AST -- 35 57*  ALT -- 42* 53*  ALKPHOS -- 154* 168*  BILITOT -- 0.5 0.6  BILIDIR -- -- --  IBILI -- -- --  PREALBUMIN -- -- --  TRIG 82 -- --  CHOLHDL -- -- --  CHOL 116 -- --   Estimated Creatinine Clearance: 72.9 ml/min (by C-G formula based on Cr of 0.83).   No results found for this basename: GLUCAP:3 in the last 72 hours  Medications:  Scheduled:    . ceFEPime (MAXIPIME) IV  1 g Intravenous Q12H  . docusate sodium  100 mg Oral BID  . lidocaine-prilocaine   Topical Once  . pantoprazole (PROTONIX) IV  40 mg Intravenous Q24H  . senna-docusate  1 tablet Oral BID  . sodium chloride      . sodium chloride      . DISCONTD: enoxaparin  40 mg Subcutaneous Q24H  . DISCONTD: levothyroxine  125 mcg  Oral BH-q7a  . DISCONTD: multivitamin with minerals  1 tablet Oral Custom   Insulin Requirements in the past 24 hours:  none  Current Nutrition:  TPN infusing at 114 ml/hr (bags brought from home)  Assessment: Labs OK  Nutritional Goals:  Continue current nutrition plan  Plan:  Continue to allow pt to use own TPN bag from home.  Will add MVI to pt's bag today per home regimen. Labs per protocol  Valrie Hart A 12/06/2011,12:53 PM

## 2011-12-06 NOTE — Progress Notes (Signed)
Subjective: The patient denies nausea or vomiting this morning. She says that her abdomen feels less hard. She's had 3 loose bowel movements so far this morning. She denies bright red blood in her stools or black tarry stools.  Objective: Vital signs in last 24 hours: Filed Vitals:   12/05/11 1500 12/05/11 2155 12/06/11 0537  BP: 119/78 110/71 102/66  Pulse: 70 80 73  Temp: 95.5 F (35.3 C) 98.5 F (36.9 C) 98 F (36.7 C)  TempSrc: Oral Oral Oral  Resp: 20 19 18   Height: 5' 5.5" (1.664 m)    Weight: 73.029 kg (161 lb)    SpO2: 100% 96% 97%    Intake/Output Summary (Last 24 hours) at 12/06/11 0943 Last data filed at 12/06/11 0550  Gross per 24 hour  Intake 1197.09 ml  Output   1000 ml  Net 197.09 ml    Weight change:   Physical exam: Lungs: Decreased breath sounds in the bases, otherwise clear. Heart: S1, S2,with a soft systolic murmur. Abdomen: Ostomy bag with clear yellow urine at the right mid abdomen; positive bowel sounds, softer, less distended, minimal epigastric tenderness. No masses palpated. Extremities: No pedal edema.  Lab Results: Basic Metabolic Panel:  Basename 12/06/11 0533 12/05/11 1900  NA 136 137  K 3.9 3.8  CL 103 103  CO2 25 24  GLUCOSE 102* 87  BUN 21 28*  CREATININE 0.83 0.81  CALCIUM 9.2 9.6  MG 1.7 1.8  PHOS 4.3 4.3   Liver Function Tests:  Basename 12/06/11 0533 12/05/11 1900  AST 35 57*  ALT 42* 53*  ALKPHOS 154* 168*  BILITOT 0.5 0.6  PROT 5.8* 6.4  ALBUMIN 2.9* 3.1*    Basename 12/05/11 1900  LIPASE 34  AMYLASE --   No results found for this basename: AMMONIA:2 in the last 72 hours CBC:  Basename 12/06/11 0533 12/05/11 1900  WBC 2.8* 3.6*  NEUTROABS 1.3* 2.1  HGB 10.6* 11.2*  HCT 29.9* 30.9*  MCV 74.8* 75.2*  PLT 195 221   Cardiac Enzymes: No results found for this basename: CKTOTAL:3,CKMB:3,CKMBINDEX:3,TROPONINI:3 in the last 72 hours BNP: No results found for this basename: PROBNP:3 in the last 72  hours D-Dimer: No results found for this basename: DDIMER:2 in the last 72 hours CBG: No results found for this basename: GLUCAP:6 in the last 72 hours Hemoglobin A1C: No results found for this basename: HGBA1C in the last 72 hours Fasting Lipid Panel:  Basename 12/06/11 0534  CHOL 116  HDL --  LDLCALC --  TRIG 82  CHOLHDL --  LDLDIRECT --   Thyroid Function Tests:  Basename 12/05/11 1900  TSH 0.009*  T4TOTAL --  FREET4 --  T3FREE --  THYROIDAB --   Anemia Panel: No results found for this basename: VITAMINB12,FOLATE,FERRITIN,TIBC,IRON,RETICCTPCT in the last 72 hours Coagulation: No results found for this basename: LABPROT:2,INR:2 in the last 72 hours Urine Drug Screen: Drugs of Abuse  No results found for this basename: labopia, cocainscrnur, labbenz, amphetmu, thcu, labbarb    Alcohol Level: No results found for this basename: ETH:2 in the last 72 hours Urinalysis:  Basename 12/05/11 2155  COLORURINE AMBER*  LABSPEC 1.015  PHURINE 8.0  GLUCOSEU NEGATIVE  HGBUR SMALL*  BILIRUBINUR NEGATIVE  KETONESUR NEGATIVE  PROTEINUR TRACE*  UROBILINOGEN 0.2  NITRITE POSITIVE*  LEUKOCYTESUR LARGE*   Misc. Labs:   Micro: No results found for this or any previous visit (from the past 240 hour(s)).  Studies/Results: Dg Abd 1 View  12/05/2011  *RADIOLOGY REPORT*  Clinical Data: 61 year old female with abdominal pain and nausea. Urothelial carcinoma status post radiation and chemotherapy. Possible obstruction.  ABDOMEN - 1 VIEW  Comparison: 11/09/2011.  Findings: Portable supine AP view 1715 hours.  There is a dilated gas filled bowel loop in the mid abdomen which is too low to be the stomach.  This is probably as severely dilated small bowel loop, similar to the findings on 09/27/2011.  Paucity of bowel gas elsewhere except for a small amount of gas suspected in the stomach. No definite pneumoperitoneum.  IMPRESSION: High-grade small bowel obstruction.  No free air is evident.   Original Report Authenticated By: Harley Hallmark, M.D.   Dg Chest Port 1 View  12/05/2011  *RADIOLOGY REPORT*  Clinical Data: 61 year old female with abdominal pain and nausea. Urothelial carcinoma status post radiation and chemotherapy.  PORTABLE CHEST - 1 VIEW  Comparison: 04/04/2011 and earlier.  Findings: Portable semi upright AP view 1710 hours.  Stable right chest Port-A-Cath.  Left side PICC line catheter now in place.  The PICC line tip is at the SVC level.  Chronically low lung volumes, lower than previous studies.  No pneumothorax, pulmonary edema, pleural effusion or consolidation.  Patchy/nodular asymmetric opacity at the left lung base.  Cardiac size and mediastinal contours are within normal limits.  Visualized tracheal air column is within normal limits.  IMPRESSION: Low lung volumes.  Asymmetric nodular opacity at the left lung base such that a developing left lung pneumonia cannot be excluded. Otherwise no acute cardiopulmonary abnormality identified.  Original Report Authenticated By: Harley Hallmark, M.D.    Medications:  Scheduled:   . ceFEPime (MAXIPIME) IV  1 g Intravenous Q12H  . docusate sodium  100 mg Oral BID  . lidocaine-prilocaine   Topical Once  . pantoprazole (PROTONIX) IV  40 mg Intravenous Q24H  . senna-docusate  1 tablet Oral BID  . sodium chloride      . sodium chloride      . DISCONTD: enoxaparin  40 mg Subcutaneous Q24H  . DISCONTD: levothyroxine  125 mcg Oral BH-q7a  . DISCONTD: multivitamin with minerals  1 tablet Oral Custom   Continuous:   . ADULT TPN 114 mL/hr at 12/05/11 2255  . dextrose 5 % and 0.9 % NaCl with KCl 20 mEq/L 50 mL/hr at 12/05/11 2100  . NONFORMULARY OR COMPOUNDED ITEM     OZH:YQMVHQIONGEXB, acetaminophen, alum & mag hydroxide-simeth, guaiFENesin-dextromethorphan, HYDROmorphone (DILAUDID) injection, levalbuterol, lidocaine-prilocaine, ondansetron  Assessment: Principal Problem:  *Recurrent UTI Active Problems:  Abdominal  pain,  other specified site  N&V (nausea and vomiting)  HYPOTHYROIDISM  SBO (small bowel obstruction)  Malnutrition of moderate degree  Leukopenia   1. Recurrent small bowel obstruction. The patient is less symptomatic this morning. Her abdomen is less distended. She is actively having bowel movements. She told me that she was told that she is not an operative candidate due to severe scar tissue. She has not required an NG tube. General surgery has been consulted for further recommendations.  Recurrent urinary tract infections. I wonder if this is just simply colonization from multiple bacteria from her GI tract due to the ileal conduit. Multiple antibiotics for chronic colonization may lead to multiresistant bacteria. She does not have a fever. She does not have an elevated white blood cell count. Nevertheless, we will continue cefepime for treatment of Enterobacter growing in her urine culture.  Hypothyroidism with evidence of iatrogenic hyperthyroidism. The patient's TSH is very low. We'll hold Synthroid and ordered free  T4 and free T3 for further evaluation.  Chronic anorexia/malnutrition. We'll continue TPN as ordered.  Leukopenia. Etiology unknown. She has had periodic leukopenia in the past. I have a suspicion that it could be antibiotic induced. However, this will be followed. We'll order a vitamin B12 level to rule out deficiency. We'll her HIV serology as well.  Chronic microcytic anemia. Based on review of laboratory studies over the past year, this has been chronic in the setting of multiple antibiotics, multiple small bowel obstructions, etc.     Plan:  1. General surgery consult ordered and is pending. 2. We'll start a clear liquid diet as tolerated. 3. We'll hold Synthroid. We'll order free T4 and free T3. 4. We'll order a vitamin B12 level and HIV serology for workup of leukopenia. 5. Will check the results of the followup urine culture pending.    LOS: 1 day    Mattix Imhof 12/06/2011, 9:43 AM

## 2011-12-06 NOTE — Care Management Note (Unsigned)
    Page 1 of 1   12/06/2011     3:14:49 PM   CARE MANAGEMENT NOTE 12/06/2011  Patient:  [name], [name]   Account Number:  [id number]  Date Initiated:  12/06/2011  Documentation initiated by:  Rosemary Holms  Subjective/Objective Assessment:   Pt admitted from hoome where she lives with her daughter. Admitted with abdominal pain. Pt has [facility] RN. Admits with TPN and PICC line.     Action/Plan:   Pt to have a dose of Cefepine IV and plan to DC home for home antibiotics with [facility]. [facility] will be suppling the medication.   Anticipated DC Date:  12/07/2011   Anticipated DC Plan:  HOME W HOME HEALTH SERVICES      DC Planning Services  CM consult      Choice offered to / List presented to:          [facility] arranged  HH-1 RN      [facility] agency  [facility]   Status of service:  In process, will continue to follow Medicare Important Message given?   (If response is "NO", the following Medicare IM given date fields will be blank) Date Medicare IM given:   Date Additional Medicare IM given:    Discharge Disposition:    Per UR Regulation:    If discussed at Long Length of Stay Meetings, dates discussed:    Comments:  12/06/11 1430 Maudry Zeidan Leanord Hawking RN BSN CM [facility] will do medication for pt. #[phone]. [facility] will be providing RN IV care. AP RN to call [facility] at [phone] for RN on call. Will need to fax order and medication IV order to them at [phone]. It has been requested that the hospital give the AM dose prior to DC and then the PM dose can be given by the [facility] RN giving the [facility] time to have medication prepared.

## 2011-12-06 NOTE — Progress Notes (Signed)
INITIAL ADULT NUTRITION ASSESSMENT Date: 12/06/2011   Time: 2:47 PM Reason for Assessment: dysphagia, home TPN  ASSESSMENT: Female 61 y.o.  Dx: Recurrent UTI  Hx:  Past Medical History  Diagnosis Date  . Bowel obstruction several    Recurrent SBO secondary to adhesions  . Hypothyroidism   . Acid reflux   . Chronic back pain   . Hyperlipidemia   . Hypertension     pt says taken off medication since has lost weight.  . Cancer 2011 [facility])    Invasive High grade Urothelial carcinoma- s/p radiation and Chemo   . UTI (lower urinary tract infection)     Recurrent  . Malnutrition   . Leukopenia 12/06/2011   Past Surgical History  Procedure Date  . Back surgery   . Hernia repair   . Abdominal surgery   . Cholecystectomy   . Ileo conduit     For bladder cancer  . Cystectomy    Related Meds:  Scheduled Meds:   . ceFEPime (MAXIPIME) IV  1 g Intravenous Q12H  . docusate sodium  100 mg Oral BID  . lidocaine-prilocaine   Topical Once  . pantoprazole (PROTONIX) IV  40 mg Intravenous Q24H  . senna-docusate  1 tablet Oral BID  . sodium chloride      . sodium chloride      . DISCONTD: enoxaparin  40 mg Subcutaneous Q24H  . DISCONTD: levothyroxine  125 mcg Oral BH-q7a  . DISCONTD: multivitamin with minerals  1 tablet Oral Custom   Continuous Infusions:   . ADULT TPN 114 mL/hr at 12/05/11 2255  . dextrose 5 % and 0.9 % NaCl with KCl 20 mEq/L 10 mL/hr (12/06/11 0956)  . NONFORMULARY OR COMPOUNDED ITEM    . DISCONTD: NONFORMULARY OR COMPOUNDED ITEM     PRN Meds:.acetaminophen, acetaminophen, alum & mag hydroxide-simeth, guaiFENesin-dextromethorphan, HYDROmorphone (DILAUDID) injection, levalbuterol, lidocaine-prilocaine, ondansetron  Ht: 5' 5.5" (166.4 cm)  Wt: 161 lb (73.029 kg)  Ideal Wt: 58.15 kg  % Ideal Wt: 126%  Usual Wt: 155-161# % Usual Wt: 100%  Body mass index is 26.38 kg/(m^2).  Food/Nutrition Related Hx: Pt has been receiving home TPN through [facility] since August 2012, due to nausea, vomiting, and malnutrition. Pt is using home TPN during hospitalization, noted pt receives 1448 kcals daily (95% of estimated energy needs) from TPN per review of TPN bag in room. Pt reports she started having trouble swallowing her gummy MVI since last Tuesday and was recommended to switch to One A Day MVI, which she has not tried yet. She denies trouble swallowing other pills, foods, or liquids.  She reports a variable appetite, stating she sometimes goes 2 days without eating. Her home PO diet mainly consists of eggs, cream of wheat, oatmeal, crackers, and broth made from bouillon cubes. Pt and pt daughter report minimal intake today, as pt has been sipping on some cranberry juice all day. She reports she has tried Ensure in the past, but did not tolerate it well. She is agreeable to try [employer].  Weight has a positive trend in past 1-6 months.  While pt does not meet criteria to diagnose malnutrition, pt is at high risk for malnutrition and development of pressure ulcers given poor po intake, hx of malnutrition, and nonambulatory status. Wt Readings from Last 20 Encounters:  12/05/11 161 lb (73.029 kg)  12/05/11 161 lb (73.029 kg)  11/21/11 159 lb (72.122 kg)  10/08/11 152 lb (68.947 kg)  10/01/11 161 lb (  73.029 kg)  09/27/11 159 lb (72.122 kg)  09/03/11 164 lb (74.39 kg)  05/10/11 156 lb (70.761 kg)  03/29/11 154 lb (69.854 kg)  03/28/11 154 lb (69.854 kg)  02/12/06 203 lb (92.08 kg)  ] Labs:  CMP     Component Value Date/Time   NA 136 12/06/2011 0533   K 3.9 12/06/2011 0533   CL 103 12/06/2011 0533   CO2 25 12/06/2011 0533   GLUCOSE 102* 12/06/2011 0533   BUN 21 12/06/2011 0533   CREATININE 0.83 12/06/2011 0533   CALCIUM 9.2 12/06/2011 0533   PROT 5.8* 12/06/2011 0533   ALBUMIN 2.9* 12/06/2011 0533   AST 35 12/06/2011 0533   ALT 42* 12/06/2011 0533   ALKPHOS 154* 12/06/2011 0533   BILITOT 0.5 12/06/2011 0533   GFRNONAA 75* 12/06/2011 0533   GFRAA 87*  12/06/2011 0533    Intake:   Intake/Output Summary (Last 24 hours) at 12/06/11 1600 Last data filed at 12/06/11 0550  Gross per 24 hour  Intake 1197.09 ml  Output   1000 ml  Net 197.09 ml    Diet Order: Clear Liquid  Supplements/Tube Feeding: none at this time  IVF:    ADULT TPN Last Rate: 114 mL/hr at 12/05/11 2255  dextrose 5 % and 0.9 % NaCl with KCl 20 mEq/L Last Rate: 10 mL/hr (12/06/11 0956)  NONFORMULARY OR COMPOUNDED ITEM   DISCONTD: NONFORMULARY OR COMPOUNDED ITEM     Estimated Nutritional Needs:   Kcal:1519-1657 kcals daily Protein:59-78 grams protein daily Fluid:1.5-1.7 L fluid daily  NUTRITION DIAGNOSIS: -Inadequate oral intake (NI-2.1).  Status: Ongoing  RELATED TO: poor appetite, n/v  AS EVIDENCE BY: pt on home TPN, consuming only small amounts of liquid by mouth.   MONITORING/EVALUATION(Goals): 1) Pt will maintain weight of 161# 2) Pt will meet >75% of estimated energy and protein needs  EDUCATION NEEDS: -No education needs identified at this time  INTERVENTION: Continue with current TPN regimen. Add Resource Breeze BID. Will follow for diet advancement.   Dietitian #: [phone]  DOCUMENTATION CODES Per approved criteria  -Not Applicable    Orlene Plum 12/06/2011, 2:37 PM

## 2011-12-07 ENCOUNTER — Encounter (HOSPITAL_COMMUNITY): Payer: Self-pay | Admitting: Internal Medicine

## 2011-12-07 DIAGNOSIS — E440 Moderate protein-calorie malnutrition: Secondary | ICD-10-CM

## 2011-12-07 DIAGNOSIS — D72819 Decreased white blood cell count, unspecified: Secondary | ICD-10-CM

## 2011-12-07 DIAGNOSIS — K56609 Unspecified intestinal obstruction, unspecified as to partial versus complete obstruction: Secondary | ICD-10-CM

## 2011-12-07 DIAGNOSIS — E782 Mixed hyperlipidemia: Secondary | ICD-10-CM

## 2011-12-07 LAB — BASIC METABOLIC PANEL
BUN: 17 mg/dL (ref 6–23)
GFR calc Af Amer: 86 mL/min — ABNORMAL LOW (ref >90)
GFR calc non Af Amer: 74 mL/min — ABNORMAL LOW (ref >90)
Potassium: 4 mEq/L (ref 3.5–5.1)

## 2011-12-07 LAB — CBC
HCT: 32.6 % — ABNORMAL LOW (ref 36.0–46.0)
Hemoglobin: 11.5 g/dL — ABNORMAL LOW (ref 12.0–15.0)
MCH: 26.4 pg (ref 26.0–34.0)
MCHC: 35.3 g/dL (ref 30.0–36.0)
RBC: 4.35 MIL/uL (ref 3.87–5.11)

## 2011-12-07 LAB — DIFFERENTIAL
Basophils Relative: 0 % (ref 0–1)
Eosinophils Absolute: 0.1 10*3/uL (ref 0.0–0.7)
Eosinophils Relative: 4 % (ref 0–5)
Monocytes Absolute: 0.3 10*3/uL (ref 0.1–1.0)
Neutro Abs: 2 10*3/uL (ref 1.7–7.7)

## 2011-12-07 MED ORDER — LEVOTHYROXINE SODIUM 75 MCG PO TABS: 75.0000 ug | ORAL_TABLET | ORAL | Status: DC

## 2011-12-07 MED ORDER — HEPARIN SOD (PORK) LOCK FLUSH 100 UNIT/ML IV SOLN
250.0000 [IU] | INTRAVENOUS | Status: AC | PRN
  Administered 2011-12-07: 250 [IU]
  Filled 2011-12-07: qty 5

## 2011-12-07 MED ORDER — HEPARIN SOD (PORK) LOCK FLUSH 100 UNIT/ML IV SOLN
500.0000 [IU] | INTRAVENOUS | Status: AC | PRN
  Administered 2011-12-07: 500 [IU]
  Filled 2011-12-07: qty 5

## 2011-12-07 MED ORDER — NONFORMULARY OR COMPOUNDED ITEM: Status: DC

## 2011-12-07 MED ORDER — DEXTROSE 5 % IV SOLN: 1.0000 g | Freq: Two times a day (BID) | INTRAVENOUS | Status: DC

## 2011-12-07 NOTE — Progress Notes (Signed)
Subjective: +BM.   No complaint of abdominal pain.  Objective: Vital signs in last 24 hours: Temp:  [97.7 F (36.5 C)-98.1 F (36.7 C)] 98.1 F (36.7 C) (06/08 0515) Pulse Rate:  [67-71] 71  (06/08 0515) Resp:  [18-20] 20  (06/08 0515) BP: (105-125)/(69-83) 105/69 mmHg (06/08 0515) SpO2:  [98 %-99 %] 99 % (06/08 0515) Weight:  [74 kg (163 lb 2.3 oz)] 74 kg (163 lb 2.3 oz) (06/08 0423) Last BM Date: 12/07/11  Intake/Output from previous day: 06/07 0701 - 06/08 0700 In: 2946.7 [P.O.:840; I.V.:271; IV Piggyback:60; TPN:1775.7] Out: 3000 [Urine:3000] Intake/Output this shift:    General appearance: alert and no distress GI: soft, nondistended.Nontender.  No masses.  Lab Results:   Basename 12/07/11 0559 12/06/11 0533  WBC 3.1* 2.8*  HGB 11.5* 10.6*  HCT 32.6* 29.9*  PLT 225 195   BMET  Basename 12/07/11 0559 12/06/11 0533  NA 135 136  K 4.0 3.9  CL 100 103  CO2 26 25  GLUCOSE 108* 102*  BUN 17 21  CREATININE 0.84 0.83  CALCIUM 9.8 9.2   PT/INR No results found for this basename: LABPROT:2,INR:2 in the last 72 hours ABG No results found for this basename: PHART:2,PCO2:2,PO2:2,HCO3:2 in the last 72 hours  Studies/Results: Dg Abd 1 View  12/06/2011  *RADIOLOGY REPORT*  Clinical Data: Small bowel obstruction follow-up  ABDOMEN - 1 VIEW  Comparison: 12/05/2011  Findings: Persistent dilated loops of small bowel are noted.  The largest is in the central abdomen measuring up to 8 cm.  Paucity of colonic gas.  IMPRESSION:  1.  Persistent small bowel obstruction pattern.  Original Report Authenticated By: Rosealee Albee, M.D.   Dg Abd 1 View  12/05/2011  *RADIOLOGY REPORT*  Clinical Data: 61 year old female with abdominal pain and nausea. Urothelial carcinoma status post radiation and chemotherapy. Possible obstruction.  ABDOMEN - 1 VIEW  Comparison: 11/09/2011.  Findings: Portable supine AP view 1715 hours.  There is a dilated gas filled bowel loop in the mid abdomen  which is too low to be the stomach.  This is probably as severely dilated small bowel loop, similar to the findings on 09/27/2011.  Paucity of bowel gas elsewhere except for a small amount of gas suspected in the stomach. No definite pneumoperitoneum.  IMPRESSION: High-grade small bowel obstruction.  No free air is evident.  Original Report Authenticated By: Harley Hallmark, M.D.   Dg Chest Port 1 View  12/05/2011  *RADIOLOGY REPORT*  Clinical Data: 61 year old female with abdominal pain and nausea. Urothelial carcinoma status post radiation and chemotherapy.  PORTABLE CHEST - 1 VIEW  Comparison: 04/04/2011 and earlier.  Findings: Portable semi upright AP view 1710 hours.  Stable right chest Port-A-Cath.  Left side PICC line catheter now in place.  The PICC line tip is at the SVC level.  Chronically low lung volumes, lower than previous studies.  No pneumothorax, pulmonary edema, pleural effusion or consolidation.  Patchy/nodular asymmetric opacity at the left lung base.  Cardiac size and mediastinal contours are within normal limits.  Visualized tracheal air column is within normal limits.  IMPRESSION: Low lung volumes.  Asymmetric nodular opacity at the left lung base such that a developing left lung pneumonia cannot be excluded. Otherwise no acute cardiopulmonary abnormality identified.  Original Report Authenticated By: Harley Hallmark, M.D.    Anti-infectives: Anti-infectives     Start     Dose/Rate Route Frequency Ordered Stop   12/07/11 0000   dextrose 5 % SOLN  50 mL with ceFEPIme 1 G SOLR 1 g        1 g 100 mL/hr over 30 Minutes Intravenous Every 12 hours 12/07/11 1123     12/05/11 2200   ceFEPIme (MAXIPIME) 1 g in dextrose 5 % 50 mL IVPB        1 g 100 mL/hr over 30 Minutes Intravenous Every 12 hours 12/05/11 1700            Assessment/Plan: s/p * No surgery found * Normal bowel function.  advance diet to baseline as tolerated.  No acute surgical findings.  Will follow periferally.   LOS: 2 days    Jahlil Ziller C 12/07/2011

## 2011-12-07 NOTE — Consult Note (Signed)
PARENTERAL NUTRITION CONSULT NOTE - FOLLOW UP Pharmacy Consult for TPN (currenly using pt's own bags from home, compounded at [facility] infusion services)  Indication: continuation of chronic TPN use at home  Allergies  Allergen Reactions  . Codeine Hives  . Nitrofuran Derivatives Itching  . Penicillins     Hives  . Sulfa Antibiotics Hives   Patient Measurements: Height: 5' 5.5" (166.4 cm) Weight: 163 lb 2.3 oz (74 kg) IBW/kg (Calculated) : 58.15   Vital Signs: Temp: 98.1 F (36.7 C) (06/08 0515) Temp src: Oral (06/08 0515) BP: 105/69 mmHg (06/08 0515) Pulse Rate: 71  (06/08 0515) Intake/Output from previous day: 06/07 0701 - 06/08 0700 In: 2946.7 [P.O.:840; I.V.:271; IV Piggyback:60; TPN:1775.7] Out: 3000 [Urine:3000] Intake/Output from this shift:    Labs:  Basename 12/07/11 0559 12/06/11 0533 12/05/11 1900  WBC 3.1* 2.8* 3.6*  HGB 11.5* 10.6* 11.2*  HCT 32.6* 29.9* 30.9*  PLT 225 195 221  APTT -- -- --  INR -- -- --    Basename 12/07/11 0559 12/06/11 0534 12/06/11 0533 12/05/11 1900  NA 135 -- 136 137  K 4.0 -- 3.9 3.8  CL 100 -- 103 103  CO2 26 -- 25 24  GLUCOSE 108* -- 102* 87  BUN 17 -- 21 28*  CREATININE 0.84 -- 0.83 0.81  LABCREA -- -- -- --  CREAT24HRUR -- -- -- --  CALCIUM 9.8 -- 9.2 9.6  MG -- -- 1.7 1.8  PHOS -- -- 4.3 4.3  PROT -- -- 5.8* 6.4  ALBUMIN -- -- 2.9* 3.1*  AST -- -- 35 57*  ALT -- -- 42* 53*  ALKPHOS -- -- 154* 168*  BILITOT -- -- 0.5 0.6  BILIDIR -- -- -- --  IBILI -- -- -- --  PREALBUMIN -- -- 13.9* --  TRIG -- 82 -- --  CHOLHDL -- -- -- --  CHOL -- 116 -- --   Estimated Creatinine Clearance: 72.5 ml/min (by C-G formula based on Cr of 0.84).   No results found for this basename: GLUCAP:3 in the last 72 hours  Medications:  Scheduled:     . ceFEPime (MAXIPIME) IV  1 g Intravenous Q12H  . docusate sodium  100 mg Oral BID  . feeding supplement  1 Container Oral BID BM  . pantoprazole (PROTONIX) IV  40 mg  Intravenous Q24H  . senna-docusate  1 tablet Oral BID  . sodium chloride       Insulin Requirements in the past 24 hours:  none  Current Nutrition:  TPN infusing at 114 ml/hr (bags brought from home)  Assessment: Labs OK Patient supplied TPN quantity will be completely utilized after this evening bag is hung.  Nutritional Goals:  Continue current nutrition plan  Plan:  Continue to allow pt to use own TPN bag from home  Labs per protocol  Mady Gemma 12/07/2011,10:20 AM

## 2011-12-07 NOTE — Progress Notes (Signed)
Patient discharged home with family.  Home Health will continue to follow. Patient discharged on IV abx and TPN. Spoke with [facility] RN  -  RN will come to administer abx tonight, and then every morning for the 5 days, then patients daughter will administer the evening doses after this evening.  Continuing TPN as was at home previously.  Patient instructed on change to thyroid medication.  Instructed to make follow up appointment with PCP ASAP.  PICC and port were both flushed with heparin.  Patient changed urostomy bag independently.  Patient/family have no questions at this time.

## 2011-12-07 NOTE — Discharge Summary (Addendum)
Physician Discharge Summary  [name] MRN: [id number] DOB/AGE: [birth year] 61 y.o.  PCP: Milinda Antis, MD, MD   Admit date: 12/05/2011 Discharge date: 12/07/2011  Discharge Diagnoses:  1. Enterobacter UTI. (Positive culture in the outpatient setting). The patient has a history of recurrent urinary tract infections. Because of her ileoconduit, she probably has chronic urinary bacteria and it is less likely that her urine will ever be completely sterile. Clinical correlation should be considered before  starting antibiotics for treatment.  2. Recurrent acute on chronic small bowel obstruction. Medically managed. On chronic TPN. 3. Chronic loose stools secondary to laxative therapy. 4. Leukopenia, possibly secondary to malnutrition or antibiotics. HIV serology negative. Her white blood cell count range from 2.8-3.6. 5. Moderate malnutrition, on chronic TPN. 6. Hypothyroidism with iatrogenic hyperthyroidism. The patient's TSH was low at 0.009 and her free T4 was elevated at 1.84. 7. Microcytic anemia, chronic. Her hemoglobin was 11.5 prior to discharge.    Medication List  As of 12/07/2011 11:49 AM   TAKE these medications         dexlansoprazole 60 MG capsule   Commonly known as: DEXILANT   Take 60 mg by mouth every morning.      dextrose 5 % SOLN 50 mL with ceFEPIme 1 G SOLR 1 g   Inject 1 g into the vein every 12 (twelve) hours. For 5 and 1/2 more days.      docusate sodium 100 MG capsule   Commonly known as: COLACE   Take 100 mg by mouth 2 (two) times daily.      levothyroxine 75 MCG tablet   Commonly known as: SYNTHROID, LEVOTHROID   Take 1 tablet (75 mcg total) by mouth every morning.      lidocaine-prilocaine cream   Commonly known as: EMLA   Apply 1 application topically as needed. Apply as directed prior to port access.      multivitamin with minerals Tabs   Take 1 tablet by mouth every other day. On Tuesdays, Thursdays, Saturdays, and Sundays.  **Administered at bedtime with TPN administration      Oxycodone HCl 10 MG Tabs   Take 10 mg by mouth every 4 (four) hours as needed. for pain      polyethylene glycol powder powder   Commonly known as: GLYCOLAX/MIRALAX   MIX 1 CAPFUL IN LIQUID AS DIRECTED AND TAKE DAILY      senna-docusate 8.6-50 MG per tablet   Commonly known as: Senokot-S   Take 1 tablet by mouth 2 (two) times daily.      TPN NICU   Inject into the vein continuous. Formula copy place in paper chart.   Lipids daily due to use of 3 in 1 bag.              Discharge Condition: Improved.  Disposition: 01-Home or Self Care   Consults: General surgeon, Dr. Leticia Penna.   Significant Diagnostic Studies: Dg Abd 1 View  12/06/2011  *RADIOLOGY REPORT*  Clinical Data: Small bowel obstruction follow-up  ABDOMEN - 1 VIEW  Comparison: 12/05/2011  Findings: Persistent dilated loops of small bowel are noted.  The largest is in the central abdomen measuring up to 8 cm.  Paucity of colonic gas.  IMPRESSION:  1.  Persistent small bowel obstruction pattern.  Original Report Authenticated By: Rosealee Albee, M.D.   Dg Abd 1 View  12/05/2011  *RADIOLOGY REPORT*  Clinical Data: 61 year old female with abdominal pain and nausea. Urothelial carcinoma status post radiation  and chemotherapy. Possible obstruction.  ABDOMEN - 1 VIEW  Comparison: 11/09/2011.  Findings: Portable supine AP view 1715 hours.  There is a dilated gas filled bowel loop in the mid abdomen which is too low to be the stomach.  This is probably as severely dilated small bowel loop, similar to the findings on 09/27/2011.  Paucity of bowel gas elsewhere except for a small amount of gas suspected in the stomach. No definite pneumoperitoneum.  IMPRESSION: High-grade small bowel obstruction.  No free air is evident.  Original Report Authenticated By: Harley Hallmark, M.D.   Dg Chest Port 1 View  12/05/2011  *RADIOLOGY REPORT*  Clinical Data: 61 year old female with abdominal  pain and nausea. Urothelial carcinoma status post radiation and chemotherapy.  PORTABLE CHEST - 1 VIEW  Comparison: 04/04/2011 and earlier.  Findings: Portable semi upright AP view 1710 hours.  Stable right chest Port-A-Cath.  Left side PICC line catheter now in place.  The PICC line tip is at the SVC level.  Chronically low lung volumes, lower than previous studies.  No pneumothorax, pulmonary edema, pleural effusion or consolidation.  Patchy/nodular asymmetric opacity at the left lung base.  Cardiac size and mediastinal contours are within normal limits.  Visualized tracheal air column is within normal limits.  IMPRESSION: Low lung volumes.  Asymmetric nodular opacity at the left lung base such that a developing left lung pneumonia cannot be excluded. Otherwise no acute cardiopulmonary abnormality identified.  Original Report Authenticated By: Harley Hallmark, M.D.   Dg Abd Acute W/chest  11/09/2011  *RADIOLOGY REPORT*  Clinical Data: Rule out small bowel obstruction  ACUTE ABDOMEN SERIES (ABDOMEN 2 VIEW & CHEST 1 VIEW)  Comparison: 09/27/2011 and 11/14/2010  Findings: Cardiomediastinal silhouette is unremarkable.  No acute infiltrate or pulmonary edema.  Right IJ Port-A-Cath with tip in right atrium.  There is a left arm PICC line with tip in SVC.  Mild distended bowel loops are noted mid upper abdomen with some air fluid levels suspicious for ileus or early bowel obstruction. There is a paucity of bowel gas and lower abdomen. No free abdominal air is noted.  IMPRESSION: No acute disease within chest.  Mild distended bowel loops in the upper abdomen with some air fluid levels suspicious for ileus or early bowel obstruction.  Paucity of bowel gas and lower abdomen.  Original Report Authenticated By: Natasha Mead, M.D.     Microbiology: Recent Results (from the past 240 hour(s))  URINE CULTURE     Status: Normal (Preliminary result)   Collection Time   12/05/11  9:55 PM      Component Value Range Status  Comment   Specimen Description URINE, CLEAN CATCH   Final    Special Requests NONE   Final    Culture  Setup Time [id number]   Final    Colony Count PENDING   Incomplete    Culture Culture reincubated for better growth   Final    Report Status PENDING   Incomplete      Labs: Results for orders placed during the hospital encounter of 12/05/11 (from the past 48 hour(s))  COMPREHENSIVE METABOLIC PANEL     Status: Abnormal   Collection Time   12/05/11  7:00 PM      Component Value Range Comment   Sodium 137  135 - 145 (mEq/L)    Potassium 3.8  3.5 - 5.1 (mEq/L)    Chloride 103  96 - 112 (mEq/L)    CO2 24  19 - 32 (  mEq/L)    Glucose, Bld 87  70 - 99 (mg/dL)    BUN 28 (*) 6 - 23 (mg/dL)    Creatinine, Ser 4.09  0.50 - 1.10 (mg/dL)    Calcium 9.6  8.4 - 10.5 (mg/dL)    Total Protein 6.4  6.0 - 8.3 (g/dL)    Albumin 3.1 (*) 3.5 - 5.2 (g/dL)    AST 57 (*) 0 - 37 (U/L)    ALT 53 (*) 0 - 35 (U/L)    Alkaline Phosphatase 168 (*) 39 - 117 (U/L)    Total Bilirubin 0.6  0.3 - 1.2 (mg/dL)    GFR calc non Af Amer 77 (*) >90 (mL/min)    GFR calc Af Amer 90 (*) >90 (mL/min)   MAGNESIUM     Status: Normal   Collection Time   12/05/11  7:00 PM      Component Value Range Comment   Magnesium 1.8  1.5 - 2.5 (mg/dL)   PHOSPHORUS     Status: Normal   Collection Time   12/05/11  7:00 PM      Component Value Range Comment   Phosphorus 4.3  2.3 - 4.6 (mg/dL)   CBC     Status: Abnormal   Collection Time   12/05/11  7:00 PM      Component Value Range Comment   WBC 3.6 (*) 4.0 - 10.5 (K/uL)    RBC 4.11  3.87 - 5.11 (MIL/uL)    Hemoglobin 11.2 (*) 12.0 - 15.0 (g/dL)    HCT 81.1 (*) 91.4 - 46.0 (%)    MCV 75.2 (*) 78.0 - 100.0 (fL)    MCH 27.3  26.0 - 34.0 (pg)    MCHC 36.2 (*) 30.0 - 36.0 (g/dL)    RDW 78.2  95.6 - 21.3 (%)    Platelets 221  150 - 400 (K/uL)   DIFFERENTIAL     Status: Normal   Collection Time   12/05/11  7:00 PM      Component Value Range Comment   Neutrophils Relative 58  43 - 77  (%)    Neutro Abs 2.1  1.7 - 7.7 (K/uL)    Lymphocytes Relative 29  12 - 46 (%)    Lymphs Abs 1.1  0.7 - 4.0 (K/uL)    Monocytes Relative 10  3 - 12 (%)    Monocytes Absolute 0.4  0.1 - 1.0 (K/uL)    Eosinophils Relative 3  0 - 5 (%)    Eosinophils Absolute 0.1  0.0 - 0.7 (K/uL)    Basophils Relative 0  0 - 1 (%)    Basophils Absolute 0.0  0.0 - 0.1 (K/uL)   LIPASE, BLOOD     Status: Normal   Collection Time   12/05/11  7:00 PM      Component Value Range Comment   Lipase 34  11 - 59 (U/L)   TSH     Status: Abnormal   Collection Time   12/05/11  7:00 PM      Component Value Range Comment   TSH 0.009 (*) 0.350 - 4.500 (uIU/mL)   URINALYSIS, ROUTINE W REFLEX MICROSCOPIC     Status: Abnormal   Collection Time   12/05/11  9:55 PM      Component Value Range Comment   Color, Urine AMBER (*) YELLOW  BIOCHEMICALS MAY BE AFFECTED BY COLOR   APPearance CLEAR  CLEAR     Specific Gravity, Urine 1.015  1.005 - 1.030  pH 8.0  5.0 - 8.0     Glucose, UA NEGATIVE  NEGATIVE (mg/dL)    Hgb urine dipstick SMALL (*) NEGATIVE     Bilirubin Urine NEGATIVE  NEGATIVE     Ketones, ur NEGATIVE  NEGATIVE (mg/dL)    Protein, ur TRACE (*) NEGATIVE (mg/dL)    Urobilinogen, UA 0.2  0.0 - 1.0 (mg/dL)    Nitrite POSITIVE (*) NEGATIVE     Leukocytes, UA LARGE (*) NEGATIVE    URINE CULTURE     Status: Normal (Preliminary result)   Collection Time   12/05/11  9:55 PM      Component Value Range Comment   Specimen Description URINE, CLEAN CATCH      Special Requests NONE      Culture  Setup Time [id number]      Colony Count PENDING      Culture Culture reincubated for better growth      Report Status PENDING     URINE MICROSCOPIC-ADD ON     Status: Abnormal   Collection Time   12/05/11  9:55 PM      Component Value Range Comment   Squamous Epithelial / LPF RARE  RARE     WBC, UA 21-50  <3 (WBC/hpf)    RBC / HPF 11-20  <3 (RBC/hpf)    Bacteria, UA MANY (*) RARE    COMPREHENSIVE METABOLIC PANEL     Status:  Abnormal   Collection Time   12/06/11  5:33 AM      Component Value Range Comment   Sodium 136  135 - 145 (mEq/L)    Potassium 3.9  3.5 - 5.1 (mEq/L)    Chloride 103  96 - 112 (mEq/L)    CO2 25  19 - 32 (mEq/L)    Glucose, Bld 102 (*) 70 - 99 (mg/dL)    BUN 21  6 - 23 (mg/dL)    Creatinine, Ser 2.35  0.50 - 1.10 (mg/dL)    Calcium 9.2  8.4 - 10.5 (mg/dL)    Total Protein 5.8 (*) 6.0 - 8.3 (g/dL)    Albumin 2.9 (*) 3.5 - 5.2 (g/dL)    AST 35  0 - 37 (U/L)    ALT 42 (*) 0 - 35 (U/L)    Alkaline Phosphatase 154 (*) 39 - 117 (U/L)    Total Bilirubin 0.5  0.3 - 1.2 (mg/dL)    GFR calc non Af Amer 75 (*) >90 (mL/min)    GFR calc Af Amer 87 (*) >90 (mL/min)   CBC     Status: Abnormal   Collection Time   12/06/11  5:33 AM      Component Value Range Comment   WBC 2.8 (*) 4.0 - 10.5 (K/uL)    RBC 4.00  3.87 - 5.11 (MIL/uL)    Hemoglobin 10.6 (*) 12.0 - 15.0 (g/dL)    HCT 57.3 (*) 22.0 - 46.0 (%)    MCV 74.8 (*) 78.0 - 100.0 (fL)    MCH 26.5  26.0 - 34.0 (pg)    MCHC 35.5  30.0 - 36.0 (g/dL)    RDW 25.4  27.0 - 62.3 (%)    Platelets 195  150 - 400 (K/uL)   PREALBUMIN     Status: Abnormal   Collection Time   12/06/11  5:33 AM      Component Value Range Comment   Prealbumin 13.9 (*) 17.0 - 34.0 (mg/dL)   MAGNESIUM     Status: Normal   Collection Time  12/06/11  5:33 AM      Component Value Range Comment   Magnesium 1.7  1.5 - 2.5 (mg/dL)   PHOSPHORUS     Status: Normal   Collection Time   12/06/11  5:33 AM      Component Value Range Comment   Phosphorus 4.3  2.3 - 4.6 (mg/dL)   DIFFERENTIAL     Status: Abnormal   Collection Time   12/06/11  5:33 AM      Component Value Range Comment   Neutrophils Relative 48  43 - 77 (%)    Neutro Abs 1.3 (*) 1.7 - 7.7 (K/uL)    Lymphocytes Relative 34  12 - 46 (%)    Lymphs Abs 1.0  0.7 - 4.0 (K/uL)    Monocytes Relative 13 (*) 3 - 12 (%)    Monocytes Absolute 0.4  0.1 - 1.0 (K/uL)    Eosinophils Relative 5  0 - 5 (%)    Eosinophils Absolute 0.1   0.0 - 0.7 (K/uL)    Basophils Relative 1  0 - 1 (%)    Basophils Absolute 0.0  0.0 - 0.1 (K/uL)   T3, FREE     Status: Normal   Collection Time   12/06/11  5:33 AM      Component Value Range Comment   T3, Free 3.8  2.3 - 4.2 (pg/mL)   T4, FREE     Status: Abnormal   Collection Time   12/06/11  5:33 AM      Component Value Range Comment   Free T4 1.84 (*) 0.80 - 1.80 (ng/dL)   HIV ANTIBODY (ROUTINE TESTING)     Status: Normal   Collection Time   12/06/11  5:33 AM      Component Value Range Comment   HIV NON REACTIVE  NON REACTIVE    VITAMIN B12     Status: Normal   Collection Time   12/06/11  5:33 AM      Component Value Range Comment   Vitamin B-12 288  211 - 911 (pg/mL)   CHOLESTEROL, TOTAL     Status: Normal   Collection Time   12/06/11  5:34 AM      Component Value Range Comment   Cholesterol 116  0 - 200 (mg/dL)   TRIGLYCERIDES     Status: Normal   Collection Time   12/06/11  5:34 AM      Component Value Range Comment   Triglycerides 82  <150 (mg/dL)   CBC     Status: Abnormal   Collection Time   12/07/11  5:59 AM      Component Value Range Comment   WBC 3.1 (*) 4.0 - 10.5 (K/uL)    RBC 4.35  3.87 - 5.11 (MIL/uL)    Hemoglobin 11.5 (*) 12.0 - 15.0 (g/dL)    HCT 30.8 (*) 65.7 - 46.0 (%)    MCV 74.9 (*) 78.0 - 100.0 (fL)    MCH 26.4  26.0 - 34.0 (pg)    MCHC 35.3  30.0 - 36.0 (g/dL)    RDW 84.6  96.2 - 95.2 (%)    Platelets 225  150 - 400 (K/uL)   DIFFERENTIAL     Status: Normal   Collection Time   12/07/11  5:59 AM      Component Value Range Comment   Neutrophils Relative 61  43 - 77 (%)    Lymphocytes Relative 24  12 - 46 (%)    Monocytes Relative 11  3 - 12 (%)    Eosinophils Relative 4  0 - 5 (%)    Basophils Relative 0  0 - 1 (%)    Neutro Abs 2.0  1.7 - 7.7 (K/uL)    Lymphs Abs 0.7  0.7 - 4.0 (K/uL)    Monocytes Absolute 0.3  0.1 - 1.0 (K/uL)    Eosinophils Absolute 0.1  0.0 - 0.7 (K/uL)    Basophils Absolute 0.0  0.0 - 0.1 (K/uL)    WBC Morphology ATYPICAL  LYMPHOCYTES      Smear Review LARGE PLATELETS PRESENT     BASIC METABOLIC PANEL     Status: Abnormal   Collection Time   12/07/11  5:59 AM      Component Value Range Comment   Sodium 135  135 - 145 (mEq/L)    Potassium 4.0  3.5 - 5.1 (mEq/L)    Chloride 100  96 - 112 (mEq/L)    CO2 26  19 - 32 (mEq/L)    Glucose, Bld 108 (*) 70 - 99 (mg/dL)    BUN 17  6 - 23 (mg/dL)    Creatinine, Ser 1.61  0.50 - 1.10 (mg/dL)    Calcium 9.8  8.4 - 10.5 (mg/dL)    GFR calc non Af Amer 74 (*) >90 (mL/min)    GFR calc Af Amer 86 (*) >90 (mL/min)      HPI : The patient is a 61 year old woman with a history significant for recurrent small bowel obstructions, bladder cancer with status post ileal conduit, chronic nausea and vomiting, and recurrent urinary tract infections. She presented as a direct admission from Dr. [name] office for treatment of an Enterobacter urinary tract infection. For further details please see the dictated history and physical.  HOSPITAL COURSE: The outpatient urine culture revealed that the Enterobacter was susceptible to cefepime. Therefore, the patient was started on cefepime every 12 hours. She was started on symptomatic treatment with IV Protonix, IV Zofran, and as needed IV hydromorphone. For further evaluation, a number of studies were ordered. Her urinalysis revealed nitrites, 21-50 WBCs, 11-20 RBCs, and many bacteria. Urine culture was ordered but the result was pending at the time of discharge. Her chest x-ray revealed low lung volumes. Her abdominal x-ray revealed a high-grade small bowel obstruction. Following this result, general surgeon Dr. Leticia Penna was consulted. At the time of his assessment, the patient had no further nausea or vomiting. She continued to have loose bowel movements. For this reason, he recommended supportive treatment and advancing her diet as tolerated. There was no surgical need or intervention. Of note, the patient did mention that the surgeons at [facility] did not believe she was an operative candidate due to excessive scar tissue within her abdomen. After keeping the patient nearly n.p.o., her diet was advanced to a clear liquid diet which she tolerated well.   The patient was noted to have leukopenia. For further evaluation, HIV serology and vitamin B12 level was ordered. Her HIV serology was negative. Her vitamin B12 level was within normal limits at 288. Her thyroid function was also assessed given that she has a history of hypothyroidism, on Synthroid. Her TSH was low at 0.009 and her free T4 was elevated at 1.84, consistent with iatrogenic hyperthyroidism. Synthroid was discontinued during the hospitalization but was restarted at a lower dose of 75 mcg daily prior to discharge. She had been taking 125 mcg daily at home.  The patient remained hemodynamically stable and afebrile. She was discharged to  home on 5 and one-half more days of IV cefepime via the PICC line which she already had  prior to this hospitalization. Home health services were ordered to be continued. The patient has an ileal conduit. Because of this, she may not ever have a sterile urine. Even if she has urinary bacteria, she may not have an active clinical urinary tract infection. Clinical correlation should be considered before further antibiotic treatment is started.  Discharge Exam: Blood pressure 105/69, pulse 71, temperature 98.1 F (36.7 C), temperature source Oral, resp. rate 20, height 5' 5.5" (1.664 m), weight 74 kg (163 lb 2.3 oz), SpO2 99.00%.  Lungs: Clear to auscultation bilaterally. Heart: S1, S2, with a soft systolic murmur. Abdomen: Positive back with clear yellow urine. Ostomy site pink. Positive bowel sounds, soft, nontender, nondistended. Extremities: No pedal edema.   Discharge Orders    Future Orders Please Complete By Expires   Increase activity slowly      Discharge instructions      Comments:   Continue full liquid diet as prescribed by Dr.  [name]. Continue TPN. You are being discharged home on IV antibiotics cefepime as directed. The dose of your thyroid medication has been changed to a lower dose of 75 mcg daily.     Total discharge time: 40 minutes. Follow-up Information    Follow up with Milinda Antis, MD. Schedule an appointment as soon as possible for a visit in 5 days.   Contact information:   [address] [place] [address] [phone]          Signed: Braun Rocca 12/07/2011, 11:49 AM

## 2011-12-09 ENCOUNTER — Telehealth: Payer: Self-pay | Admitting: Family Medicine

## 2011-12-09 MED ORDER — LEVOTHYROXINE SODIUM 75 MCG PO TABS: 75.0000 ug | ORAL_TABLET | ORAL | Status: DC

## 2011-12-09 NOTE — Telephone Encounter (Signed)
Sent in

## 2011-12-09 NOTE — Telephone Encounter (Signed)
I spoke with pt, she has a antibiotics cefempine until Thursday Surgeon did not feel it was a true SBO She feels very well, no N/V, no abd pain, passing flatus and stools Given instructions she can advise to soft foods only- she can have cream potatoes, applesauce, runny oatmeal, no other foods, she will schedule a f/u this week

## 2011-12-10 NOTE — Progress Notes (Signed)
UR Chart Review Completed  

## 2011-12-12 LAB — URINE CULTURE: Culture  Setup Time: 201306071435

## 2011-12-13 ENCOUNTER — Encounter: Payer: Self-pay | Admitting: Family Medicine

## 2011-12-13 ENCOUNTER — Ambulatory Visit (INDEPENDENT_AMBULATORY_CARE_PROVIDER_SITE_OTHER): Payer: Medicare Other | Admitting: Family Medicine

## 2011-12-13 VITALS — BP 122/82 | HR 82 | Resp 16 | Ht 65.5 in | Wt 156.4 lb

## 2011-12-13 DIAGNOSIS — E440 Moderate protein-calorie malnutrition: Secondary | ICD-10-CM

## 2011-12-13 DIAGNOSIS — R197 Diarrhea, unspecified: Secondary | ICD-10-CM

## 2011-12-13 DIAGNOSIS — N390 Urinary tract infection, site not specified: Secondary | ICD-10-CM

## 2011-12-13 DIAGNOSIS — E039 Hypothyroidism, unspecified: Secondary | ICD-10-CM

## 2011-12-13 NOTE — Patient Instructions (Signed)
F/U with [facility] Next week Continue current meds Try smoothies - keep on soft food diet until seen by [facility] F/U 2 Months

## 2011-12-13 NOTE — Progress Notes (Signed)
  Subjective:    Patient ID: [name], female    DOB: June 22, 1951, 61 y.o.   MRN: [id number]  HPI Patient presents for hospital followup she has been doing well. She only had some abdominal pain today after eating [employer] candy. Her bowel syndrome unchanged. She's not having any problems with her PICC line or Port-A-Cath. Her daughter who cares for her accompanies her today Thyroid studies revealed hyperthyroid state, medication decreased to  Review of Systems GEN- denies fatigue, fever, weight loss,weakness, recent illness HEENT- denies eye drainage, change in vision, nasal discharge, CVS- denies chest pain, palpitations RESP- denies SOB, cough, wheeze ABD- denies N/V, change in stools, abd pain GU- denies dysuria, hematuria, dribbling, incontinence MSK- + joint pain, muscle aches, injury Neuro- denies headache, dizziness, syncope, seizure activity      Objective:   Physical Exam GEN- NAD, alert and oriented x3 HEENT- perrl, EOMI, non icteric, MMM, oropharynx clear CVS- RRR, no murmur RESP-CTAB ABD- BS in all 4 quadrants, , ostomy in RLQ with urine bag attached d/c/i- Non-tender, no apparent distension EXT- No edema Pulses- Radial, DP- 2+       Assessment & Plan:

## 2011-12-14 NOTE — Assessment & Plan Note (Signed)
Negative studies, chronic laxative use

## 2011-12-14 NOTE — Assessment & Plan Note (Signed)
S/p treatment, culture showed enterobacter and Klebsiella, pt overall doing much better

## 2011-12-14 NOTE — Assessment & Plan Note (Signed)
Pt to see [facility] next week regarding her TPN and diet in setting of recurrent SBO

## 2011-12-14 NOTE — Assessment & Plan Note (Signed)
Synthroid adjusted will recheck TFT at next visit in 2 months

## 2011-12-18 ENCOUNTER — Emergency Department (HOSPITAL_COMMUNITY)
Admission: EM | Admit: 2011-12-18 | Discharge: 2011-12-18 | Discharge status: Against Medical Advice | Payer: Medicare Other | Attending: Emergency Medicine | Admitting: Emergency Medicine

## 2011-12-18 ENCOUNTER — Encounter (HOSPITAL_COMMUNITY): Payer: Self-pay | Admitting: *Deleted

## 2011-12-18 DIAGNOSIS — N898 Other specified noninflammatory disorders of vagina: Secondary | ICD-10-CM | POA: Insufficient documentation

## 2011-12-18 NOTE — ED Notes (Signed)
Vag bleeding, Has had no bleeding for 25 years.  No pain.  Had check up today at [facility]. But no pelvic exam

## 2011-12-19 ENCOUNTER — Telehealth: Payer: Self-pay | Admitting: Family Medicine

## 2011-12-19 ENCOUNTER — Other Ambulatory Visit (HOSPITAL_COMMUNITY)
Admission: RE | Admit: 2011-12-19 | Discharge: 2011-12-19 | Disposition: A | Discharge status: Home / Self Care | Payer: Medicare Other | Source: Ambulatory Visit | Attending: Family Medicine | Admitting: Family Medicine

## 2011-12-19 ENCOUNTER — Encounter: Payer: Self-pay | Admitting: Family Medicine

## 2011-12-19 ENCOUNTER — Ambulatory Visit (INDEPENDENT_AMBULATORY_CARE_PROVIDER_SITE_OTHER): Payer: Medicare Other | Admitting: Family Medicine

## 2011-12-19 VITALS — BP 134/68 | HR 81 | Resp 18 | Ht 65.5 in | Wt 159.1 lb

## 2011-12-19 DIAGNOSIS — N950 Postmenopausal bleeding: Secondary | ICD-10-CM

## 2011-12-19 DIAGNOSIS — Z01419 Encounter for gynecological examination (general) (routine) without abnormal findings: Secondary | ICD-10-CM | POA: Insufficient documentation

## 2011-12-19 NOTE — Patient Instructions (Signed)
We will set you up for an ultrasound for the bleeding Cultures taken Please call back so that I can refer you to the GYN of your choice Continue to hold the laxatives Keep previous f/u appt

## 2011-12-19 NOTE — Assessment & Plan Note (Signed)
PAP Smear, cultures obtained, will send for ultrasound and GYN pending results

## 2011-12-19 NOTE — Progress Notes (Signed)
  Subjective:    Patient ID: [name], female    DOB: April 16, 1951, 61 y.o.   MRN: [id number]  HPI Patient presents with vaginal bleeding for the past 36 hours. She noticed after wiping that she had blood coming from the vagina. She has a mild discharge as well. She's been having upper to 15 loose stools a day which is due to chronic laxative use she was seen by her specialist at [facility] who discontinued all of his medications for now. She is continued on TPN. She is menopausal she's not had a period in 20 years. She denies rectal bleeding. She's not had a Pap smear noted since 2008   Review of Systems  - per above  GEN- denies fatigue, fever, weight loss,weakness, recent illness HEENT- denies eye drainage, change in vision, nasal discharge, CVS- denies chest pain, palpitations RESP- denies SOB, cough, wheeze ABD- denies N/V, + (Diarrhea), abd pain GU- denies dysuria, hematuria, dribbling, incontinence MSK- denies joint pain, muscle aches, injury Neuro- denies headache, dizziness, syncope, seizure activity         Objective:   Physical Exam GEN-NAD,alert and oriented x 3 ABD- GU- normal external genitalia, vaginal mucosa pink and moist, cervix very posterior and feels contracted into vault- bleeding noted in from cervix,  minimal thin clear discharge, no CMT, no ovarian masses, nodular feeling uterus        Assessment & Plan:

## 2011-12-20 ENCOUNTER — Ambulatory Visit (HOSPITAL_COMMUNITY)
Admission: RE | Admit: 2011-12-20 | Discharge: 2011-12-20 | Disposition: A | Discharge status: Home / Self Care | Payer: Medicare Other | Source: Ambulatory Visit | Attending: Family Medicine | Admitting: Family Medicine

## 2011-12-20 DIAGNOSIS — N950 Postmenopausal bleeding: Secondary | ICD-10-CM

## 2011-12-20 DIAGNOSIS — Z906 Acquired absence of other parts of urinary tract: Secondary | ICD-10-CM | POA: Insufficient documentation

## 2011-12-20 LAB — WET PREP BY MOLECULAR PROBE: Gardnerella vaginalis: POSITIVE — AB

## 2011-12-20 MED ORDER — METRONIDAZOLE 0.75 % VA GEL: 1.0000 | Freq: Every day | VAGINAL | Status: AC

## 2011-12-20 NOTE — Addendum Note (Signed)
Addended by: Milinda Antis F on: 12/20/2011 11:54 AM   Modules accepted: Orders

## 2012-01-14 ENCOUNTER — Telehealth: Payer: Self-pay | Admitting: Family Medicine

## 2012-01-14 NOTE — Telephone Encounter (Signed)
Ok to order this again?

## 2012-01-14 NOTE — Telephone Encounter (Signed)
Called and spoke with [facility] at the cancer center.  They will schedule pt.

## 2012-01-14 NOTE — Telephone Encounter (Signed)
Ok to order, please call cancer center to have her scheduled

## 2012-01-15 ENCOUNTER — Encounter (HOSPITAL_COMMUNITY): Payer: Medicare Other | Attending: Family Medicine

## 2012-01-15 DIAGNOSIS — Z452 Encounter for adjustment and management of vascular access device: Secondary | ICD-10-CM | POA: Insufficient documentation

## 2012-01-15 DIAGNOSIS — Z789 Other specified health status: Secondary | ICD-10-CM | POA: Insufficient documentation

## 2012-01-15 MED ORDER — ALTEPLASE 2 MG IJ SOLR
2.0000 mg | Freq: Once | INTRAMUSCULAR | Status: AC
  Administered 2012-01-15: 2 mg

## 2012-01-15 NOTE — Progress Notes (Signed)
Patient returned after having Alteplase placed in PICC line this AM.  Alteplase removed and blood return noted.  PICC then flushed with 20ml saline and 250units Heparin.  Cap on line changed as well.

## 2012-02-07 ENCOUNTER — Ambulatory Visit (HOSPITAL_COMMUNITY)
Admission: RE | Admit: 2012-02-07 | Discharge: 2012-02-07 | Disposition: A | Discharge status: Home / Self Care | Payer: Medicare Other | Source: Ambulatory Visit | Attending: Family Medicine | Admitting: Family Medicine

## 2012-02-07 ENCOUNTER — Encounter: Payer: Self-pay | Admitting: Family Medicine

## 2012-02-07 ENCOUNTER — Ambulatory Visit (INDEPENDENT_AMBULATORY_CARE_PROVIDER_SITE_OTHER): Payer: Medicare Other | Admitting: Family Medicine

## 2012-02-07 VITALS — BP 108/80 | HR 77 | Resp 16 | Ht 65.5 in | Wt 158.8 lb

## 2012-02-07 DIAGNOSIS — K56609 Unspecified intestinal obstruction, unspecified as to partial versus complete obstruction: Secondary | ICD-10-CM

## 2012-02-07 DIAGNOSIS — R933 Abnormal findings on diagnostic imaging of other parts of digestive tract: Secondary | ICD-10-CM | POA: Insufficient documentation

## 2012-02-07 DIAGNOSIS — R109 Unspecified abdominal pain: Secondary | ICD-10-CM

## 2012-02-07 DIAGNOSIS — N390 Urinary tract infection, site not specified: Secondary | ICD-10-CM

## 2012-02-07 LAB — POCT URINALYSIS DIPSTICK
Glucose, UA: NEGATIVE
Ketones, UA: NEGATIVE
Spec Grav, UA: 1.02

## 2012-02-07 MED ORDER — OXYCODONE HCL 10 MG PO TABS: 10.0000 mg | ORAL_TABLET | ORAL | Status: DC | PRN

## 2012-02-07 MED ORDER — ONDANSETRON 8 MG PO TBDP: 8.0000 mg | ORAL_TABLET | Freq: Three times a day (TID) | ORAL | Status: AC | PRN

## 2012-02-07 NOTE — Assessment & Plan Note (Signed)
Per above, likley partial SBO, will send urine for culture, no systemic signs of infection. No antibiotics given  Pt has had multiple UTI and has colonization

## 2012-02-07 NOTE — Patient Instructions (Addendum)
Get the x-ray done today We will call if antibiotics needed No food by mouth Continue TPN Small sips of water okay

## 2012-02-07 NOTE — Assessment & Plan Note (Signed)
Pt has recurrent SBO, likely partial SOB currently, xray does not show definite obstruction pt moving bowel and passing gas. We will make her NPO with exception of meds, I believe this started with the foods she tried to eat this week. We discussed ER evaluation if symptoms worsen, she does not want to go to ER currently

## 2012-02-07 NOTE — Progress Notes (Signed)
  Subjective:    Patient ID: [name], female    DOB: June 11, 1951, 61 y.o.   MRN: [id number]  HPI  Abdominal pain for 2 days, pain is sharp and similar to previous bowel obstructions though not as severe. Pain mostly in upper quadrants non radiating, last week she had diarrhea upwards of 15 times a day, told by her physicians at [facility], to stop eating and stop laxatives, she did this for a few days however resumed eating this week, when she was traveling on Tuesday and Wed this week she had fried fish, chicken, french fries when advised not to eat this types of foods. Stools now loose but down to 3 times a day, using oxycodone every 4 hours for past 2 days. No fever, no change in urine output.    Review of Systems - per above   GEN- denies fatigue, fever, weight loss,weakness, recent illness HEENT- denies eye drainage, change in vision, nasal discharge, CVS- denies chest pain, palpitations RESP- denies SOB, cough, wheeze ABD- + N/V, change in stools,+ abd pain GU- denies dysuria, hematuria, dribbling, incontinence MSK- denies joint pain, muscle aches, injury Neuro- denies headache, dizziness, syncope, seizure activity      Objective:   Physical Exam GEN- NAD, alert and oriented x3, non toxic appearing HEENT- perrl, EOMI, non icteric, MMM, oropharynx clear CVS- RRR, no murmur RESP-CTAB ABD- BS in all 4 quadrants, , ostomy in RLQ with urine bag attached d/c/i- mild TTP upper quadrants, no rebound, no guarding, no distension, peristalsis visualized EXT- No edema Pulses- Radial, DP- 2+       Assessment & Plan:

## 2012-02-09 ENCOUNTER — Telehealth: Payer: Self-pay | Admitting: Family Medicine

## 2012-02-09 NOTE — Telephone Encounter (Signed)
Pt called switchboard, and I was paged, when I called back, no answer, message left for the pt to call back, and I also notified the operator of failure to speak with pt at the time I attempted to respond, which was within 5 minutes of being paged . PCP has already advised pt that if abdominal pain and obstructive symptoms worsened , she should go to the ED

## 2012-02-09 NOTE — Telephone Encounter (Signed)
noted 

## 2012-02-09 NOTE — Telephone Encounter (Signed)
Called back stating she ws having increased pain like uTI, wanted to know if she could try azo, had also mentioned that she was wanting to take more hydrocodone., i advised her not to change how she has been taking her chronic pain meds. I advised her to try the azo, one . States she does not feel the abdominal pain is from bowel obstruction. Denies fever, chills or flank pain. Will continue to wait on c/s results per PCP direction

## 2012-02-10 ENCOUNTER — Telehealth: Payer: Self-pay | Admitting: Family Medicine

## 2012-02-10 MED ORDER — CIPROFLOXACIN HCL 750 MG PO TABS: 750.0000 mg | ORAL_TABLET | Freq: Two times a day (BID) | ORAL | Status: DC

## 2012-02-10 NOTE — Telephone Encounter (Signed)
Pt given lab results,  I am not completely convinced this is UTI causing problem, I looked back at her cultures, she has had E coli, Klebisella, Enteroccous and now Pseudomonas growing. She has not had BM today, previous 3-5 x a day, she has increased cramping with drinking water. I did discuss I do not think UTI is causing all these symptoms and it likley bowel obstruction, however she is becoming more fatigued and sick per phone convo, therefore will start Cipro to cover pseudomonas.

## 2012-02-11 LAB — URINE CULTURE: Colony Count: >=100000

## 2012-02-18 ENCOUNTER — Ambulatory Visit (INDEPENDENT_AMBULATORY_CARE_PROVIDER_SITE_OTHER): Payer: Medicare Other | Admitting: Family Medicine

## 2012-02-18 ENCOUNTER — Encounter: Payer: Self-pay | Admitting: Family Medicine

## 2012-02-18 VITALS — BP 124/84 | HR 83 | Resp 16 | Ht 65.5 in | Wt 159.1 lb

## 2012-02-18 DIAGNOSIS — E039 Hypothyroidism, unspecified: Secondary | ICD-10-CM

## 2012-02-18 DIAGNOSIS — I10 Essential (primary) hypertension: Secondary | ICD-10-CM

## 2012-02-18 DIAGNOSIS — Z1239 Encounter for other screening for malignant neoplasm of breast: Secondary | ICD-10-CM

## 2012-02-18 DIAGNOSIS — K56609 Unspecified intestinal obstruction, unspecified as to partial versus complete obstruction: Secondary | ICD-10-CM

## 2012-02-18 DIAGNOSIS — Z789 Other specified health status: Secondary | ICD-10-CM

## 2012-02-18 DIAGNOSIS — N390 Urinary tract infection, site not specified: Secondary | ICD-10-CM

## 2012-02-18 DIAGNOSIS — E785 Hyperlipidemia, unspecified: Secondary | ICD-10-CM

## 2012-02-18 MED ORDER — POLYETHYLENE GLYCOL 3350 17 GM/SCOOP PO POWD: ORAL | Status: DC

## 2012-02-18 NOTE — Assessment & Plan Note (Signed)
Status post treatment with recent culture showing Pseudomonas. She is doing well currently. We may have to have urology see her at [facility] with these recurrent infections

## 2012-02-18 NOTE — Patient Instructions (Addendum)
Back off the senakot  Mammogram to be scheduled We will add the labs for your thyroid  Continue your current medications  Watch your diet  F/U 3 months

## 2012-02-18 NOTE — Assessment & Plan Note (Signed)
Recent partial obstruction appears to have resolved

## 2012-02-18 NOTE — Assessment & Plan Note (Signed)
Recheck thyroid function studies. 

## 2012-02-18 NOTE — Assessment & Plan Note (Signed)
Blood pressure well controlled off of medications that she has significant weight loss a few years ago

## 2012-02-18 NOTE — Progress Notes (Signed)
  Subjective:    Patient ID: [name], female    DOB: 19-Dec-1950, 61 y.o.   MRN: [id number]  HPI Patient here for chronic medical problems. She has no specific concerns today. She is back to eating soft foods and also tried [place] sausage. Her bowels are moving 4 times a day and are back to their loose texture. She is taking Senokot as well as MiraLAX and stool softener. Last Friday she had to have her PICC line replaced at [facility]. She is due for repeat thyroid studies. Due for mammogram She's not had a colonoscopy that she can recall.    Review of Systems - per above  GEN- denies fatigue, fever, weight loss,weakness, recent illness HEENT- denies eye drainage, change in vision, nasal discharge, CVS- denies chest pain, palpitations RESP- denies SOB, cough, wheeze ABD- denies N/V, change in stools, abd pain GU- denies dysuria, hematuria, dribbling, incontinence MSK- + joint pain, muscle aches, injury Neuro- denies headache, dizziness, syncope, seizure activity       Objective:   Physical Exam GEN- NAD, alert and oriented x3 HEENT- PERRL, EOMI, non icteric, MMM, oropharynx clear Neck- supple, no thyromegaly  CVS- RRR, no murmur RESP-CTAB ABD- NABS, non tender, ostomy in RLQ with urine bag , ND  EXT- No edema Pulses- Radial, DP- 2+ Right chest wall- port-a- cath, picc line in left arm        Assessment & Plan:

## 2012-02-18 NOTE — Assessment & Plan Note (Signed)
No medications currently I will have her complete lipid panel checked by her home health nurse. She has evidence of fatty liver on scan

## 2012-02-18 NOTE — Assessment & Plan Note (Signed)
With her recurrent bowel obstruction this will likely be long-term this is very difficult for patient except that she tends to eat foods that she should not which can cause her bowel traction

## 2012-02-20 ENCOUNTER — Emergency Department (HOSPITAL_COMMUNITY): Payer: Medicare Other

## 2012-02-20 ENCOUNTER — Emergency Department (HOSPITAL_COMMUNITY)
Admission: EM | Admit: 2012-02-20 | Discharge: 2012-02-20 | Disposition: A | Discharge status: Home / Self Care | Payer: Medicare Other | Attending: Emergency Medicine | Admitting: Emergency Medicine

## 2012-02-20 ENCOUNTER — Encounter (HOSPITAL_COMMUNITY): Payer: Self-pay

## 2012-02-20 DIAGNOSIS — R109 Unspecified abdominal pain: Secondary | ICD-10-CM | POA: Insufficient documentation

## 2012-02-20 DIAGNOSIS — I10 Essential (primary) hypertension: Secondary | ICD-10-CM | POA: Insufficient documentation

## 2012-02-20 DIAGNOSIS — C679 Malignant neoplasm of bladder, unspecified: Secondary | ICD-10-CM | POA: Insufficient documentation

## 2012-02-20 DIAGNOSIS — Z9221 Personal history of antineoplastic chemotherapy: Secondary | ICD-10-CM | POA: Insufficient documentation

## 2012-02-20 DIAGNOSIS — Z79899 Other long term (current) drug therapy: Secondary | ICD-10-CM | POA: Insufficient documentation

## 2012-02-20 DIAGNOSIS — E039 Hypothyroidism, unspecified: Secondary | ICD-10-CM | POA: Insufficient documentation

## 2012-02-20 DIAGNOSIS — R1110 Vomiting, unspecified: Secondary | ICD-10-CM | POA: Insufficient documentation

## 2012-02-20 DIAGNOSIS — E785 Hyperlipidemia, unspecified: Secondary | ICD-10-CM | POA: Insufficient documentation

## 2012-02-20 LAB — COMPREHENSIVE METABOLIC PANEL
ALT: 23 U/L (ref 0–35)
Albumin: 3.1 g/dL — ABNORMAL LOW (ref 3.5–5.2)
Alkaline Phosphatase: 186 U/L — ABNORMAL HIGH (ref 39–117)
Potassium: 3.8 mEq/L (ref 3.5–5.1)
Sodium: 137 mEq/L (ref 135–145)
Total Protein: 6.5 g/dL (ref 6.0–8.3)

## 2012-02-20 LAB — CBC WITH DIFFERENTIAL/PLATELET
Basophils Relative: 0 % (ref 0–1)
Eosinophils Absolute: 0 10*3/uL (ref 0.0–0.7)
MCH: 26.9 pg (ref 26.0–34.0)
MCHC: 35.9 g/dL (ref 30.0–36.0)
Neutrophils Relative %: 83 % — ABNORMAL HIGH (ref 43–77)
Platelets: 249 10*3/uL (ref 150–400)
RDW: 15.8 % — ABNORMAL HIGH (ref 11.5–15.5)

## 2012-02-20 LAB — LIPASE, BLOOD: Lipase: 83 U/L — ABNORMAL HIGH (ref 11–59)

## 2012-02-20 MED ORDER — SODIUM CHLORIDE 0.9 % IV SOLN
INTRAVENOUS | Status: DC
  Administered 2012-02-20: 17:00:00 via INTRAVENOUS

## 2012-02-20 MED ORDER — HEPARIN SOD (PORK) LOCK FLUSH 100 UNIT/ML IV SOLN
500.0000 [IU] | Freq: Once | INTRAVENOUS | Status: AC
  Administered 2012-02-20: 500 [IU] via INTRAVENOUS
  Filled 2012-02-20: qty 5

## 2012-02-20 MED ORDER — HYDROMORPHONE HCL 4 MG PO TABS: 2.0000 mg | ORAL_TABLET | ORAL | Status: DC | PRN

## 2012-02-20 MED ORDER — ONDANSETRON HCL 8 MG PO TABS: 8.0000 mg | ORAL_TABLET | ORAL | Status: AC | PRN

## 2012-02-20 MED ORDER — PANTOPRAZOLE SODIUM 40 MG IV SOLR
40.0000 mg | Freq: Once | INTRAVENOUS | Status: AC
  Administered 2012-02-20: 40 mg via INTRAVENOUS
  Filled 2012-02-20 (×2): qty 40

## 2012-02-20 MED ORDER — MORPHINE SULFATE 4 MG/ML IJ SOLN
4.0000 mg | Freq: Once | INTRAMUSCULAR | Status: AC
  Administered 2012-02-20: 4 mg via INTRAVENOUS
  Filled 2012-02-20: qty 1

## 2012-02-20 MED ORDER — ONDANSETRON HCL 4 MG/2ML IJ SOLN
4.0000 mg | Freq: Once | INTRAMUSCULAR | Status: AC
  Administered 2012-02-20: 4 mg via INTRAVENOUS
  Filled 2012-02-20 (×2): qty 2

## 2012-02-20 MED ORDER — HEPARIN SOD (PORK) LOCK FLUSH 100 UNIT/ML IV SOLN
INTRAVENOUS | Status: AC
  Filled 2012-02-20: qty 5

## 2012-02-20 MED ORDER — MORPHINE SULFATE 4 MG/ML IJ SOLN
4.0000 mg | Freq: Once | INTRAMUSCULAR | Status: AC
  Administered 2012-02-20: 4 mg via INTRAVENOUS
  Filled 2012-02-20 (×2): qty 1

## 2012-02-20 MED ORDER — SODIUM CHLORIDE 0.9 % IV BOLUS (SEPSIS)
500.0000 mL | Freq: Once | INTRAVENOUS | Status: AC
  Administered 2012-02-20: 500 mL via INTRAVENOUS

## 2012-02-20 NOTE — ED Provider Notes (Signed)
History   This chart was scribed for Charlene Hutching, MD by [name]. The patient was seen in room APA03/APA03 and the patient's care was started at 3:30 PM     CSN: [id number]  Arrival date & time 02/20/12  1512   First MD Initiated Contact with Patient 02/20/12 1518      Chief Complaint  Patient presents with  . Emesis    (Consider location/radiation/quality/duration/timing/severity/associated sxs/prior treatment) Patient is a 61 y.o. female presenting with vomiting. The history is provided by the patient. No language interpreter was used.  Emesis     [name] is a 61 y.o. female , with a hx of bowel obstruction, who presents to the Emergency Department complaining of sudden, progressively worsening, emesis, onset today, with associated symptoms of headache. The pt reports she went to [facility] yesterday, 02/19/12, checking up for bowel obstruction. The pt informs she was given a good report. The pt went home, ate a hamburger, and began cramping and vomiting last night. The pt has a hx of bowel obstruction ( generated by scar tissues, diagnosed at [facility] while undergoing chemotherapy for bladder cancer.), TPN (placed one year ago at [facility]), and bladder cancer. The pt denies and feeling of dehydration.  The pt does not smoke or drink alcohol.   PCP is Dr. [name].    Past Medical History  Diagnosis Date  . Bowel obstruction several    Recurrent SBO secondary to adhesions  . Hypothyroidism   . Acid reflux   . Chronic back pain   . Hyperlipidemia   . Hypertension     pt says taken off medication since has lost weight.  . Cancer 2011 [facility])    Invasive High grade Urothelial carcinoma- s/p radiation and Chemo   . UTI (lower urinary tract infection)     Recurrent  . Malnutrition   . Leukopenia 12/06/2011    HIV serology negative  . Fatty liver     Past Surgical History  Procedure Date  . Back surgery   . Hernia repair   . Abdominal surgery     . Cholecystectomy   . Ileo conduit     For bladder cancer  . Cystectomy     Family History  Problem Relation Age of Onset  . Heart disease Mother     enlarged  . Hypertension Mother   . Diabetes Father   . Diabetes Sister   . Diabetes Maternal Grandmother     History  Substance Use Topics  . Smoking status: Former Smoker    Quit date: 09/26/2010  . Smokeless tobacco: Not on file  . Alcohol Use: No    OB History    Grav Para Term Preterm Abortions TAB SAB Ect Mult Living                  Review of Systems  Gastrointestinal: Positive for vomiting.  All other systems reviewed and are negative.    Allergies  Codeine; Nitrofuran derivatives; Penicillins; and Sulfa antibiotics  Home Medications   Current Outpatient Rx  Name Route Sig Dispense Refill  . DEXLANSOPRAZOLE 60 MG PO CPDR Oral Take 60 mg by mouth every morning.    Marland Kitchen CEFEPIME 1 GM IVPB Intravenous Inject 1 g into the vein every 12 (twelve) hours. For 5 and 1/2 more days. 11 g 0  . DOCUSATE SODIUM 100 MG PO CAPS Oral Take 100 mg by mouth 2 (two) times daily.     Marland Kitchen LEVOTHYROXINE  SODIUM 75 MCG PO TABS Oral Take 1 tablet (75 mcg total) by mouth every morning. 30 tablet 2  . LIDOCAINE-PRILOCAINE 2.5-2.5 % EX CREA Topical Apply 1 application topically as needed. Apply as directed prior to port access.    . ADULT MULTIVITAMIN W/MINERALS CH Oral Take 1 tablet by mouth every other day. On Tuesdays, Thursdays, Saturdays, and Sundays. **Administered at bedtime with TPN administration    . OXYCODONE HCL 10 MG PO TABS Oral Take 1 tablet (10 mg total) by mouth every 4 (four) hours as needed. for pain 45 tablet 0  . POLYETHYLENE GLYCOL 3350 PO POWD  MIX 1 CAPFUL IN LIQUID AS DIRECTED AND TAKE DAILY 527 g 5  . SENNOSIDES-DOCUSATE SODIUM 8.6-50 MG PO TABS Oral Take 1 tablet by mouth 2 (two) times daily.     . TPN NICU Intravenous Inject into the vein continuous. Formula copy place in paper chart.   Lipids daily due to use of 3  in 1 bag.       BP 117/83  Pulse 71  Temp 98.1 F (36.7 C) (Oral)  Resp 20  Ht 5' 5.5" (1.664 m)  Wt 160 lb (72.576 kg)  BMI 26.22 kg/m2  SpO2 100%  Physical Exam  Nursing note and vitals reviewed. Constitutional: She is oriented to person, place, and time. She appears well-developed and well-nourished.  Eyes: Conjunctivae are normal. Pupils are equal, round, and reactive to light.  Neck: Normal range of motion.  Cardiovascular: Normal rate, regular rhythm and normal heart sounds.   Pulmonary/Chest: Effort normal and breath sounds normal.  Abdominal: Soft. Bowel sounds are normal. There is no tenderness.  Musculoskeletal: Normal range of motion.  Neurological: She is alert and oriented to person, place, and time.  Skin: Skin is warm and dry.  Psychiatric: She has a normal mood and affect. Her behavior is normal.    ED Course  Procedures (including critical care time)  DIAGNOSTIC STUDIES: Oxygen Saturation is 100% on room air, normal by my interpretation.    COORDINATION OF CARE:    3:33PM- IV fluids, Pain management, in addition to x-ray of abdomen discussed.   Results for orders placed during the hospital encounter of 02/20/12  CBC WITH DIFFERENTIAL      Component Value Range   WBC 7.1  4.0 - 10.5 K/uL   RBC 4.17  3.87 - 5.11 MIL/uL   Hemoglobin 11.2 (*) 12.0 - 15.0 g/dL   HCT 16.1 (*) 09.6 - 04.5 %   MCV 74.8 (*) 78.0 - 100.0 fL   MCH 26.9  26.0 - 34.0 pg   MCHC 35.9  30.0 - 36.0 g/dL   RDW 40.9 (*) 81.1 - 91.4 %   Platelets 249  150 - 400 K/uL   Neutrophils Relative 83 (*) 43 - 77 %   Neutro Abs 5.9  1.7 - 7.7 K/uL   Lymphocytes Relative 10 (*) 12 - 46 %   Lymphs Abs 0.7  0.7 - 4.0 K/uL   Monocytes Relative 6  3 - 12 %   Monocytes Absolute 0.4  0.1 - 1.0 K/uL   Eosinophils Relative 0  0 - 5 %   Eosinophils Absolute 0.0  0.0 - 0.7 K/uL   Basophils Relative 0  0 - 1 %   Basophils Absolute 0.0  0.0 - 0.1 K/uL  COMPREHENSIVE METABOLIC PANEL      Component  Value Range   Sodium 137  135 - 145 mEq/L   Potassium 3.8  3.5 -  5.1 mEq/L   Chloride 102  96 - 112 mEq/L   CO2 26  19 - 32 mEq/L   Glucose, Bld 106 (*) 70 - 99 mg/dL   BUN 28 (*) 6 - 23 mg/dL   Creatinine, Ser 4.69  0.50 - 1.10 mg/dL   Calcium 9.2  8.4 - 62.9 mg/dL   Total Protein 6.5  6.0 - 8.3 g/dL   Albumin 3.1 (*) 3.5 - 5.2 g/dL   AST 38 (*) 0 - 37 U/L   ALT 23  0 - 35 U/L   Alkaline Phosphatase 186 (*) 39 - 117 U/L   Total Bilirubin 0.5  0.3 - 1.2 mg/dL   GFR calc non Af Amer 64 (*) >90 mL/min   GFR calc Af Amer 74 (*) >90 mL/min  LIPASE, BLOOD      Component Value Range   Lipase 83 (*) 11 - 59 U/L      Dg Abd Acute W/chest  02/20/2012  *RADIOLOGY REPORT*  Clinical Data: Vomiting for the past 2 days.  Previous hernia repair and intestinal perforation repairs.  ACUTE ABDOMEN SERIES (ABDOMEN 2 VIEW & CHEST 1 VIEW)  Comparison: Previous examinations.  Findings: Normal sized heart.  Clear lungs.  Right jugular porta- catheter tip in the upper right atrium near the cavoatrial junction.  Left PICC tip in the superior vena cava.  Paucity of intestinal gas with scattered air-fluid levels.  No free peritoneal air.  Artifact overlying the right inferior pubic ramus. Lower lumbar spine degenerative changes and thoracic spine degenerative changes.  IMPRESSION:  1.  Paucity of intestinal gas with scattered air-fluid levels. This could be due to bowel obstruction or ileus. 2.  Right jugular port catheter tip in the upper right atrium.   Original Report Authenticated By: Darrol Angel, M.D.       No diagnosis found.    MDM  Patient feeling much better after IV fluids. She wants to go home. I offered her admission. He was her decision to go home. Discharge with Dilaudid 2 mg #15 and Zofran 4 mg #10      I personally performed the services described in this documentation, which was scribed in my presence. The recorded information has been reviewed and considered.    Charlene Hutching,  MD 02/20/12 2011

## 2012-02-20 NOTE — ED Notes (Signed)
Port-a-cath de-accessed.  Patient to be discharged home.

## 2012-02-20 NOTE — ED Notes (Signed)
Discharge instructions given and reviewed with patient and daughter.  Prescriptions given for Dilaudid and Zofran; effects and use explained.  Patient verbalized understanding of liquid diet and sedating effects of Dilaudid.  Patient ambulatory; discharged home in good condition.

## 2012-02-20 NOTE — ED Notes (Signed)
Pt states she has a bowel obstruction. Started vomiting today and thinks she may have aspirated

## 2012-02-25 ENCOUNTER — Telehealth: Payer: Self-pay | Admitting: Family Medicine

## 2012-02-25 NOTE — Telephone Encounter (Signed)
Order sent Please set up

## 2012-02-27 ENCOUNTER — Ambulatory Visit (HOSPITAL_COMMUNITY)
Admission: RE | Admit: 2012-02-27 | Discharge: 2012-02-27 | Disposition: A | Discharge status: Home / Self Care | Payer: Medicare Other | Source: Ambulatory Visit | Attending: Family Medicine | Admitting: Family Medicine

## 2012-02-27 ENCOUNTER — Encounter (HOSPITAL_COMMUNITY): Payer: Medicare Other | Attending: Family Medicine

## 2012-02-27 VITALS — Ht 66.06 in

## 2012-02-27 DIAGNOSIS — T82898A Other specified complication of vascular prosthetic devices, implants and grafts, initial encounter: Secondary | ICD-10-CM

## 2012-02-27 DIAGNOSIS — Z789 Other specified health status: Secondary | ICD-10-CM

## 2012-02-27 DIAGNOSIS — Z452 Encounter for adjustment and management of vascular access device: Secondary | ICD-10-CM | POA: Insufficient documentation

## 2012-02-27 DIAGNOSIS — T82598A Other mechanical complication of other cardiac and vascular devices and implants, initial encounter: Secondary | ICD-10-CM | POA: Insufficient documentation

## 2012-02-27 DIAGNOSIS — Y849 Medical procedure, unspecified as the cause of abnormal reaction of the patient, or of later complication, without mention of misadventure at the time of the procedure: Secondary | ICD-10-CM | POA: Insufficient documentation

## 2012-02-27 MED ORDER — HEPARIN SOD (PORK) LOCK FLUSH 100 UNIT/ML IV SOLN
INTRAVENOUS | Status: AC
  Filled 2012-02-27: qty 5

## 2012-02-27 MED ORDER — SODIUM CHLORIDE 0.9 % IJ SOLN
10.0000 mL | Freq: Once | INTRAMUSCULAR | Status: AC
  Administered 2012-02-27: 10 mL via INTRAVENOUS
  Filled 2012-02-27: qty 10

## 2012-02-27 MED ORDER — SODIUM CHLORIDE 0.9 % IJ SOLN
INTRAMUSCULAR | Status: AC
  Filled 2012-02-27: qty 10

## 2012-02-27 MED ORDER — HEPARIN SOD (PORK) LOCK FLUSH 100 UNIT/ML IV SOLN
500.0000 [IU] | Freq: Once | INTRAVENOUS | Status: AC
  Administered 2012-02-27: 300 [IU] via INTRAVENOUS
  Filled 2012-02-27: qty 5

## 2012-02-27 NOTE — Progress Notes (Signed)
Rozell Searing presented for PICC line flush. Proper placement of PICC confirmed by CXR.  PICC line located LUA.  Good blood return present. PICC line flushed with 20ml NS and 300U/95ml Heparin. Procedure without incident. Patient tolerated procedure well.  Pt reported that PICC line has been pulled out some by [facility] nurse. Therefore, even though we got blood and the picc flushed with ease we notified Dr. [name] that the PICC had come out some. She ordered a CXR to confirm placement. CXR states that the PICC tip is still in the SVC. Pt notified.

## 2012-02-28 ENCOUNTER — Other Ambulatory Visit: Payer: Self-pay

## 2012-02-28 MED ORDER — OXYCODONE HCL 10 MG PO TABS: 10.0000 mg | ORAL_TABLET | ORAL | Status: DC | PRN

## 2012-03-10 ENCOUNTER — Other Ambulatory Visit: Payer: Self-pay | Admitting: Family Medicine

## 2012-03-16 ENCOUNTER — Telehealth: Payer: Self-pay | Admitting: Family Medicine

## 2012-03-18 NOTE — Telephone Encounter (Signed)
Left message for patient to call back  

## 2012-03-26 ENCOUNTER — Telehealth: Payer: Self-pay | Admitting: Family Medicine

## 2012-03-27 ENCOUNTER — Encounter: Payer: Self-pay | Admitting: Family Medicine

## 2012-03-27 ENCOUNTER — Ambulatory Visit (INDEPENDENT_AMBULATORY_CARE_PROVIDER_SITE_OTHER): Payer: Medicare Other | Admitting: Family Medicine

## 2012-03-27 ENCOUNTER — Ambulatory Visit (HOSPITAL_COMMUNITY)
Admission: RE | Admit: 2012-03-27 | Discharge: 2012-03-27 | Disposition: A | Discharge status: Home / Self Care | Payer: Medicare Other | Source: Ambulatory Visit | Attending: Family Medicine | Admitting: Family Medicine

## 2012-03-27 VITALS — BP 120/84 | HR 89 | Resp 15 | Ht 65.5 in | Wt 158.0 lb

## 2012-03-27 DIAGNOSIS — R109 Unspecified abdominal pain: Secondary | ICD-10-CM

## 2012-03-27 DIAGNOSIS — N390 Urinary tract infection, site not specified: Secondary | ICD-10-CM

## 2012-03-27 DIAGNOSIS — T82898A Other specified complication of vascular prosthetic devices, implants and grafts, initial encounter: Secondary | ICD-10-CM

## 2012-03-27 DIAGNOSIS — Y849 Medical procedure, unspecified as the cause of abnormal reaction of the patient, or of later complication, without mention of misadventure at the time of the procedure: Secondary | ICD-10-CM | POA: Insufficient documentation

## 2012-03-27 MED ORDER — HYDROMORPHONE HCL 4 MG PO TABS: 2.0000 mg | ORAL_TABLET | ORAL | Status: AC | PRN

## 2012-03-27 NOTE — Progress Notes (Signed)
  Subjective:    Patient ID: [name], female    DOB: 02-May-1951, 61 y.o.   MRN: [id number]  HPI Patient presents with one week of abdominal cramping and strong odor to her urine. She has been eating more solid foods and had emesis one time this week. She denies any fever, shortness of breath, chest pain. She is also noted that her PICC line was slightly pulled out by her nurse at [facility] she needs a chest x-ray to evaluate its placement as they had difficulty getting a blood return. She also lets me know she will like another company to take over as she was accused of being very rude to the nurses at his home health care agency. She did take a few of her dilaudid pills which were given by the emergency room for pain along with Senokot   Review of Systems  GEN- denies fatigue, fever, weight loss,weakness, recent illness HEENT- denies eye drainage, change in vision, nasal discharge, CVS- denies chest pain, palpitations RESP- denies SOB, cough, wheeze ABD- + N/V, change in stools, +abd pain Neuro- denies headache, dizziness, syncope, seizure activity      Objective:   Physical Exam GEN- NAD, alert and oriented x3 HEENT- PERRL, EOMI, non icteric, MMM, oropharynx clear  CVS- RRR, no murmur RESP-CTAB ABD- NABS, NT, ostomy in RLQ with urine bag , ND  EXT- No edema Pulses- Radial, DP- 2+ Right chest wall- port-a- cath, picc line in left arm         Assessment & Plan:

## 2012-03-27 NOTE — Assessment & Plan Note (Addendum)
Based on pt history benign exam today, looks well appearing, urine sent for culture, more likley beginning of SBO, she is aware of when to go to ER, advised liquids or at maximum applesauce

## 2012-03-27 NOTE — Assessment & Plan Note (Signed)
Xray obtained, PICC line in SVC, she is able to push her TPN, will have nurse recheck on Monday again, she may have to go to [facility] for picc change

## 2012-03-27 NOTE — Progress Notes (Signed)
Patient arrived from Dr [name] office to check possible PICC displacement. Chest x-ray reveals PICC tip in proximal SVC per Dr. Kearney Hard. PICC flushes without difficulty but unable to get blood return. Several attempts to reach Dr. [name] in office but to no avail. Patient sent home with instructions to notify Dr. [name] Monday morning and arrange for possible TPA or PICC exchange if both parties agree.

## 2012-03-27 NOTE — Patient Instructions (Addendum)
Go to Radiology to have PICC line looked at If you have vomiting or fever, worse pain go to ER Only eat applesauce consistency for now and liquids I will call if antibiotics are needed for urine infection Pain medication refilled Keep previous f/u appt

## 2012-03-27 NOTE — Assessment & Plan Note (Signed)
No treatment given, will f/u culture, no systemic symptoms, I do not feel she has active urine infection

## 2012-03-27 NOTE — Telephone Encounter (Signed)
Pt had OV today

## 2012-04-02 LAB — URINE CULTURE: Colony Count: >=100000

## 2012-04-13 ENCOUNTER — Other Ambulatory Visit: Payer: Self-pay | Admitting: Family Medicine

## 2012-05-01 ENCOUNTER — Ambulatory Visit (INDEPENDENT_AMBULATORY_CARE_PROVIDER_SITE_OTHER): Payer: Medicare Other | Admitting: Family Medicine

## 2012-05-01 ENCOUNTER — Encounter: Payer: Self-pay | Admitting: Family Medicine

## 2012-05-01 VITALS — BP 112/82 | HR 68 | Resp 15 | Ht 65.0 in | Wt 164.4 lb

## 2012-05-01 DIAGNOSIS — R319 Hematuria, unspecified: Secondary | ICD-10-CM

## 2012-05-01 DIAGNOSIS — Z1239 Encounter for other screening for malignant neoplasm of breast: Secondary | ICD-10-CM

## 2012-05-01 DIAGNOSIS — N390 Urinary tract infection, site not specified: Secondary | ICD-10-CM

## 2012-05-01 DIAGNOSIS — E039 Hypothyroidism, unspecified: Secondary | ICD-10-CM

## 2012-05-01 DIAGNOSIS — Z436 Encounter for attention to other artificial openings of urinary tract: Secondary | ICD-10-CM

## 2012-05-01 LAB — POCT URINALYSIS DIPSTICK
Bilirubin, UA: NEGATIVE
Ketones, UA: NEGATIVE
Spec Grav, UA: 1.02
pH, UA: 7.5

## 2012-05-01 MED ORDER — OXYCODONE HCL 10 MG PO TABS: 10.0000 mg | ORAL_TABLET | ORAL | Status: DC | PRN

## 2012-05-01 NOTE — Patient Instructions (Addendum)
Continue current medication Pain medications refilled Referral to urology  Continue Lovenox  Cancel Nov 21st  Mammogram to be done  F/U 3 months

## 2012-05-03 ENCOUNTER — Encounter: Payer: Self-pay | Admitting: Family Medicine

## 2012-05-03 DIAGNOSIS — R319 Hematuria, unspecified: Secondary | ICD-10-CM | POA: Insufficient documentation

## 2012-05-03 LAB — URINE CULTURE: Colony Count: 95000

## 2012-05-03 NOTE — Assessment & Plan Note (Signed)
Pt often comes in because of changes she sees in urine bag and is concerned she has UTI, she often has abd pain but recurrent SBO symptoms at the same time making UTI less likely. Urine sent for culture- multiple bacteria seen non dominant in Nov 1st and Sept. August Psuedomonas seen, June Enterobacter. I would like to refer her to urology, to see if she needs prophylactic antibiotics or any other recommendations.

## 2012-05-03 NOTE — Assessment & Plan Note (Signed)
microscopic hematuria, likley from small vessels from ostomy in setting of Lovenox use, I think she can continue use , given red flags, she has 4 weeks of lovenox

## 2012-05-03 NOTE — Assessment & Plan Note (Signed)
TFT rechecked by her [facility] per report will obtain

## 2012-05-03 NOTE — Progress Notes (Signed)
  Subjective:    Patient ID: [name], female    DOB: May 04, 1951, 61 y.o.   MRN: [id number]  HPI Pt here with concern about blood in urine. Started on Lovenox by [facility] after CT scan of chest for cancer surveillance showed possible thrombus over [facility] a Cath. She has noticed some mornings urine is dark and cloudy with mucous, she called [facility] and was told to continue lovenox . No gross blood, no current abd pain, has some knots on stomach from shot. Needs pain meds refilled   Review of Systems - per above   GEN- denies fatigue, fever, weight loss,weakness, recent illness HEENT- denies eye drainage, change in vision, nasal discharge, CVS- denies chest pain, palpitations RESP- denies SOB, cough, wheeze ABD- denies N/V, change in stools, abd pain GU- denies dysuria, hematuria, dribbling, incontinence MSK- denies joint pain, muscle aches, injury Neuro- denies headache, dizziness, syncope, seizure activity Skin- bruising     Objective:   Physical Exam GEN- NAD, alert and oriented x3 CVS- RRR, no murmur RESP-CTAB ABD- NABS, NT, ostomy in RLQ with urine bag , ND  EXT- No edema Pulses- Radial, DP- 2+ Skin- brusing on abdominal wall at site of injections, small hematoma's felt Right chest wall- port-a- cath, picc line in left arm        Assessment & Plan:

## 2012-05-11 ENCOUNTER — Telehealth: Payer: Self-pay | Admitting: Family Medicine

## 2012-05-11 MED ORDER — LIDOCAINE-PRILOCAINE 2.5-2.5 % EX CREA: 1.0000 "application " | TOPICAL_CREAM | CUTANEOUS | Status: DC | PRN

## 2012-05-11 NOTE — Telephone Encounter (Signed)
Emla cream refilled.

## 2012-05-14 ENCOUNTER — Telehealth: Payer: Self-pay | Admitting: Family Medicine

## 2012-05-15 NOTE — Telephone Encounter (Signed)
Sent back and told her I needed it sent again because we didn't have it

## 2012-05-20 ENCOUNTER — Telehealth: Payer: Self-pay

## 2012-05-20 NOTE — Telephone Encounter (Signed)
Yes that will be fine. 

## 2012-05-21 ENCOUNTER — Encounter: Payer: Self-pay | Admitting: Family Medicine

## 2012-05-21 ENCOUNTER — Ambulatory Visit (INDEPENDENT_AMBULATORY_CARE_PROVIDER_SITE_OTHER): Payer: Medicare Other | Admitting: Family Medicine

## 2012-05-21 VITALS — BP 124/72 | HR 83 | Resp 18 | Ht 65.0 in | Wt 160.1 lb

## 2012-05-21 DIAGNOSIS — E039 Hypothyroidism, unspecified: Secondary | ICD-10-CM

## 2012-05-21 DIAGNOSIS — K56600 Partial intestinal obstruction, unspecified as to cause: Secondary | ICD-10-CM

## 2012-05-21 DIAGNOSIS — R918 Other nonspecific abnormal finding of lung field: Secondary | ICD-10-CM

## 2012-05-21 DIAGNOSIS — K56609 Unspecified intestinal obstruction, unspecified as to partial versus complete obstruction: Secondary | ICD-10-CM

## 2012-05-21 DIAGNOSIS — K7689 Other specified diseases of liver: Secondary | ICD-10-CM

## 2012-05-21 DIAGNOSIS — K760 Fatty (change of) liver, not elsewhere classified: Secondary | ICD-10-CM

## 2012-05-21 DIAGNOSIS — Z1211 Encounter for screening for malignant neoplasm of colon: Secondary | ICD-10-CM

## 2012-05-21 DIAGNOSIS — E785 Hyperlipidemia, unspecified: Secondary | ICD-10-CM

## 2012-05-21 DIAGNOSIS — R9389 Abnormal findings on diagnostic imaging of other specified body structures: Secondary | ICD-10-CM

## 2012-05-21 DIAGNOSIS — Z0000 Encounter for general adult medical examination without abnormal findings: Secondary | ICD-10-CM

## 2012-05-21 NOTE — Patient Instructions (Addendum)
No foods, small amounts of liquids  Get the labs drawn  Continue current medications I will call with results from [facility] We will cancel appointment with urology here in town Call and schedule your Mammogram I will call about the colonoscopy if this is needed If pain does not improve go to ER F/U 3 months for routine appt

## 2012-05-21 NOTE — Telephone Encounter (Signed)
Noted  

## 2012-05-22 ENCOUNTER — Other Ambulatory Visit: Payer: Self-pay

## 2012-05-22 ENCOUNTER — Ambulatory Visit (HOSPITAL_COMMUNITY)
Admission: RE | Admit: 2012-05-22 | Discharge: 2012-05-22 | Disposition: A | Discharge status: Home / Self Care | Payer: Medicare Other | Source: Ambulatory Visit | Attending: Family Medicine | Admitting: Family Medicine

## 2012-05-22 ENCOUNTER — Telehealth: Payer: Self-pay | Admitting: Family Medicine

## 2012-05-22 DIAGNOSIS — R918 Other nonspecific abnormal finding of lung field: Secondary | ICD-10-CM | POA: Insufficient documentation

## 2012-05-22 DIAGNOSIS — N390 Urinary tract infection, site not specified: Secondary | ICD-10-CM

## 2012-05-22 DIAGNOSIS — K56600 Partial intestinal obstruction, unspecified as to cause: Secondary | ICD-10-CM

## 2012-05-22 DIAGNOSIS — R9389 Abnormal findings on diagnostic imaging of other specified body structures: Secondary | ICD-10-CM

## 2012-05-22 DIAGNOSIS — K56609 Unspecified intestinal obstruction, unspecified as to partial versus complete obstruction: Secondary | ICD-10-CM | POA: Insufficient documentation

## 2012-05-22 LAB — HEPATIC FUNCTION PANEL
AST: 15 U/L (ref 0–37)
Albumin: 3.8 g/dL (ref 3.5–5.2)
Alkaline Phosphatase: 177 U/L — ABNORMAL HIGH (ref 39–117)
Total Protein: 6.8 g/dL (ref 6.0–8.3)

## 2012-05-22 LAB — TSH: TSH: 10.181 u[IU]/mL — ABNORMAL HIGH (ref 0.350–4.500)

## 2012-05-23 ENCOUNTER — Encounter: Payer: Self-pay | Admitting: Family Medicine

## 2012-05-23 MED ORDER — LEVOTHYROXINE SODIUM 88 MCG PO TABS: 88.0000 ug | ORAL_TABLET | Freq: Every day | ORAL | Status: DC

## 2012-05-23 NOTE — Progress Notes (Signed)
Subjective:    Patient ID: [name], female    DOB: 06/01/1951, 61 y.o.   MRN: [id number]  HPI Subjective:   Patient presents for Medicare Annual/Subsequent preventive examination.   Review Past Medical/Family/Social: Reviewed  ABdominal pain for the past week, seen at [facility], xray , UC , bloodwork done, has not heard results, pain all over, passing gas, BM unchanged, +nausea, no fever, has cloudy substance in urine. Continued on lovenox secondary to clot over port a cath Risk Factors  Current exercise habits: none Dietary issues discussed: very soft foods, liquids, TPN  Cardiac risk factors: HTN  Depression Screen  (Note: if answer to either of the following is "Yes", a more complete depression screening is indicated)  Over the past two weeks, have you felt down, depressed or hopeless? No Over the past two weeks, have you felt little interest or pleasure in doing things? No Have you lost interest or pleasure in daily life? No Do you often feel hopeless? No Do you cry easily over simple problems? No   Activities of Daily Living  In your present state of health, do you have any difficulty performing the following activities?:  Driving? No  Managing money? No  Feeding yourself? No  Getting from bed to chair? No  Climbing a flight of stairs? No  Preparing food and eating?: No  Bathing or showering? No  Getting dressed: No  Getting to the toilet? No  Using the toilet:No  Moving around from place to place: No  In the past year have you fallen or had a near fall?:No  Are you sexually active? No  Do you have more than one partner? No   Hearing Difficulties: No  Do you often ask people to speak up or repeat themselves? No  Do you experience ringing or noises in your ears? No Do you have difficulty understanding soft or whispered voices? No  Do you feel that you have a problem with memory? No Do you often misplace items? No  Do you feel safe at home? Yes  Cognitive  Testing  Alert? Yes Normal Appearance?Yes  Oriented to person? Yes Place? Yes  Time? Yes  Recall of three objects? Yes  Can perform simple calculations? Yes  Displays appropriate judgment?Yes  Can read the correct time from a watch face?Yes   List the Names of Other Physician/Practitioners you currently use: [facility], [facility]   Indicate any recent Medical Services you may have received from other than [facility] providers in the past year (date may be approximate).  - per above-  Screening Tests / Date Colonoscopy  - too high risk secondary to surgeries and recurrent SBO                   Zostavax - Declines Mammogram - Pending Influenza Vaccine - Declines Tetanus/tdap- Declines    Assessment:    Annual wellness medicare exam   Plan:    During the course of the visit the patient was educated and counseled about appropriate screening and preventive services including:  Screening mammography to be done Colorectal cancer screening - will discuss with GI, possible virtual colonoscopy Shingles vaccine. - Declines  Diet review for nutrition referral? Yes ____ Not Indicated __x__  Patient Instructions (the written plan) was given to the patient.  Medicare Attestation  I have personally reviewed:  The patient's medical and social history  Their use of alcohol, tobacco or illicit drugs  Their current medications and supplements  The patient's  functional ability including ADLs,fall risks, home safety risks, cognitive, and hearing and visual impairment  Diet and physical activities  Evidence for depression or mood disorders  The patient's weight, height, BMI, and visual acuity have been recorded in the chart. I have made referrals, counseling, and provided education to the patient based on review of the above and I have provided the patient with a written personalized care plan for preventive services.        Review of Systems     Objective:   Physical  Exam        Assessment & Plan:

## 2012-05-23 NOTE — Assessment & Plan Note (Addendum)
Partial SBO, repeat xray done with continued pain shows SBO, xray from [facility] stated unchanged bowel loops, UC was lost at [facility], repeated in office awaiting results, doubt acute infection Labs unremarkable NPO with very small sips for meds Given red flags, she has had many SBO, currently, no emesis, passing gas and having BM currently

## 2012-05-23 NOTE — Assessment & Plan Note (Signed)
Increase to 88mcg 

## 2012-05-26 ENCOUNTER — Ambulatory Visit (INDEPENDENT_AMBULATORY_CARE_PROVIDER_SITE_OTHER): Payer: Medicare Other | Admitting: Gastroenterology

## 2012-05-26 ENCOUNTER — Ambulatory Visit (HOSPITAL_COMMUNITY): Payer: Medicare Other

## 2012-05-26 ENCOUNTER — Encounter: Payer: Self-pay | Admitting: Gastroenterology

## 2012-05-26 ENCOUNTER — Ambulatory Visit: Payer: Medicare Other | Admitting: Gastroenterology

## 2012-05-26 VITALS — BP 120/77 | HR 77 | Temp 98.5°F | Ht 63.0 in | Wt 163.6 lb

## 2012-05-26 DIAGNOSIS — R7989 Other specified abnormal findings of blood chemistry: Secondary | ICD-10-CM

## 2012-05-26 DIAGNOSIS — K760 Fatty (change of) liver, not elsewhere classified: Secondary | ICD-10-CM

## 2012-05-26 DIAGNOSIS — Z1211 Encounter for screening for malignant neoplasm of colon: Secondary | ICD-10-CM

## 2012-05-26 DIAGNOSIS — K56609 Unspecified intestinal obstruction, unspecified as to partial versus complete obstruction: Secondary | ICD-10-CM

## 2012-05-26 DIAGNOSIS — K7689 Other specified diseases of liver: Secondary | ICD-10-CM

## 2012-05-26 NOTE — Patient Instructions (Addendum)
We are setting you up for a virtual colonoscopy in the near future.  I will discuss with Dr. Jena Gauss your case, and we will contact you with any further recommendations or changes.

## 2012-05-26 NOTE — Progress Notes (Signed)
Faxed to PCP

## 2012-05-26 NOTE — Assessment & Plan Note (Signed)
Improving clinically. Pt is well aware of signs/symptoms that would prompt emergent evaluation.

## 2012-05-26 NOTE — Assessment & Plan Note (Signed)
61 year old female with need for initial screening for colon cancer. Complicated history including multiple SBOs in past secondary to adhesive disease. She is currently maintained on TPN and followed by [facility]. As of 11/22, small bowel obstruction noted. However, clinically, pt is improving nicely. +flatus, +BM, decreased distension. Although ideally, she would have a complete lower GI evaluation via a colonoscopy, she would rather pursue a virtual colonoscopy. She has had several incidences of intestinal perforation in the past, and she is fearful regarding invasive procedure. Also, due to chronic small bowel obstructions, it may be difficult to adequately prep her but not impossible. I discussed at length pursuing a colonoscopy with modified prep (i.e. Clear liquids several days, split prep). She would rather pursue a virtual colonoscopy. Also, she is on lovenox due to recent clot near site of porta cath. She has told me she may need to be on this life-long.   At pt's request, we will proceed with a virtual colonoscopy in the near future. She would like to wait until Jan 2014 to pursue this. I will discuss pt's case in further detail with Dr. Jena Gauss, and pt is aware of this and has no further questions.

## 2012-05-26 NOTE — Progress Notes (Signed)
Primary Care Physician:  Milinda Antis, MD Primary Gastroenterologist:  Dr. Jena Gauss   Chief Complaint  Patient presents with  . Follow-up  . Colonoscopy    HPI:   [name] is a 61 year old female who presents today at the request of Dr. [name] for an initial screening colonoscopy. Her history is complicated by recurrent small bowel obstructions, secondary to adhesive disease. Followed by [facility] every 3 months. PICC line placed Aug 2012. On TPN. Had potato soup last night, wants Thanksgiving food. Surgery is not an option per pt due to significant adhesions. Told to not eat beef, greens, salad.  +BMs currently. Stomach less distended. No rectal bleeding. On stool softeners, laxatives, stay loose. No diarrhea. Denies abdominal pain currently. No wt loss or lack of appetite. +reflux, on Dexilant. No dysphagia. Recently diagnosed with blood clot over porta cath. Seen by Dr. [name] 11/21. Abd xray confirmed SBO. Treatment as outpatient with NPO status, now slowly advancing diet. Clinically improving.    Intestinal injury twice in past; in 1997 and 2010. (back surgery in 97, bladder surgery in 2010). Concerned about colonoscopy. On Lovenox.    Past Medical History  Diagnosis Date  . Bowel obstruction several    Recurrent SBO secondary to adhesions  . Hypothyroidism   . Acid reflux   . Chronic back pain   . Hyperlipidemia   . Hypertension     pt says taken off medication since has lost weight.  . Cancer 2011 [facility])    diagnosed in 2009 per pt. Invasive High grade Urothelial carcinoma- s/p radiation and Chemo   . UTI (lower urinary tract infection)     Recurrent  . Malnutrition   . Leukopenia 12/06/2011    HIV serology negative  . Fatty liver     Past Surgical History  Procedure Date  . Back surgery     X2  . Hernia repair     mesh  . Abdominal surgery     with intestinal "puncture" x 2, exploratory surgery   . Cholecystectomy   . Ileo conduit     For bladder cancer    . Cystectomy     breast  . Portacath placement     Current Outpatient Prescriptions  Medication Sig Dispense Refill  . DEXILANT 60 MG capsule TAKE 1 CAPSULE BY MOUTH DAILY  30 capsule  3  . docusate sodium (COLACE) 100 MG capsule Take 100 mg by mouth 2 (two) times daily.       Marland Kitchen enoxaparin (LOVENOX) 80 MG/0.8ML injection Inject into the skin every 12 (twelve) hours.      Marland Kitchen levothyroxine (SYNTHROID, LEVOTHROID) 88 MCG tablet Take 1 tablet (88 mcg total) by mouth daily.  30 tablet  3  . lidocaine-prilocaine (EMLA) cream Apply 1 application topically as needed. Apply as directed prior to port access.  30 g  11  . Multiple Vitamin (MULITIVITAMIN WITH MINERALS) TABS Take 1 tablet by mouth every other day. On Tuesdays, Thursdays, Saturdays, and Sundays. **Administered at bedtime with TPN administration      . Oxycodone HCl 10 MG TABS Take 1 tablet (10 mg total) by mouth every 4 (four) hours as needed. for pain  45 tablet  0  . polyethylene glycol powder (GLYCOLAX/MIRALAX) powder MIX 1 CAPFUL IN LIQUID AS DIRECTED AND TAKE DAILY  527 g  5  . senna-docusate (SENOKOT-S) 8.6-50 MG per tablet Take 1 tablet by mouth 2 (two) times daily.       . TPN NICU Inject  into the vein continuous. Formula copy place in paper chart.   Lipids daily due to use of 3 in 1 bag.       Marland Kitchen dexlansoprazole (DEXILANT) 60 MG capsule Take 60 mg by mouth every morning.      Marland Kitchen dextrose 5 % SOLN 50 mL with ceFEPIme 1 G SOLR 1 g Inject 1 g into the vein every 12 (twelve) hours. For 5 and 1/2 more days.  11 g  0    Allergies as of 05/26/2012 - Review Complete 05/26/2012  Allergen Reaction Noted  . Codeine Hives 03/28/2011  . Nitrofuran derivatives Itching 03/29/2011  . Penicillins Hives 12/05/2011  . Sulfa antibiotics Hives 03/28/2011    Family History  Problem Relation Age of Onset  . Heart disease Mother     enlarged  . Hypertension Mother   . Diabetes Father   . Diabetes Sister   . Diabetes Maternal Grandmother   .  Colon cancer Neg Hx     History   Social History  . Marital Status: Widowed    Spouse Name: N/A    Number of Children: 2  . Years of Education: N/A   Occupational History  .     Social History Main Topics  . Smoking status: Former Smoker    Quit date: 09/26/2010  . Smokeless tobacco: Not on file  . Alcohol Use: No  . Drug Use: No  . Sexually Active: Yes    Birth Control/ Protection: Post-menopausal   Other Topics Concern  . Not on file   Social History Narrative  . No narrative on file    Review of Systems: Gen: Denies any fever, chills, fatigue, weight loss, lack of appetite.  CV: Denies chest pain, heart palpitations, peripheral edema, syncope.  Resp: Denies shortness of breath at rest or with exertion. Denies wheezing or cough.  GI: SEE HPI GU : chronic UTI MS: Denies joint pain, muscle weakness, cramps, or limitation of movement.  Derm: +dry skin Psych: Denies depression, anxiety, memory loss, and confusion Heme: Denies bruising, bleeding, and enlarged lymph nodes.  Physical Exam: BP 120/77  Pulse 77  Temp 98.5 F (36.9 C) (Temporal)  Ht 5\' 3"  (1.6 m)  Wt 163 lb 9.6 oz (74.208 kg)  BMI 28.98 kg/m2 General:   Alert and oriented. Pleasant and cooperative. Well-nourished and well-developed.  Head:  Normocephalic and atraumatic. Eyes:  Without icterus, sclera clear and conjunctiva pink.  Ears:  Normal auditory acuity. Nose:  No deformity, discharge,  or lesions. Mouth:  No deformity or lesions, oral mucosa pink.  Neck:  Supple, without mass or thyromegaly. Lungs:  Clear to auscultation bilaterally. No wheezes, rales, or rhonchi. No distress.  Heart:  S1, S2 present without murmurs appreciated.  Abdomen:  +BS, soft, mild distension but soft. Non-tender No HSM noted. No guarding or rebound. ostomy noted with clear, yellow urine.  Rectal:  Deferred  Msk:  Symmetrical without gross deformities. Normal posture. Extremities:  Without clubbing or  edema. Neurologic:  Alert and  oriented x4;  grossly normal neurologically. Skin:  Intact without significant lesions or rashes. Cervical Nodes:  No significant cervical adenopathy. Psych:  Alert and cooperative. Normal mood and affect.

## 2012-05-26 NOTE — Addendum Note (Signed)
Addended by: Milinda Antis F on: 05/26/2012 01:20 PM   Modules accepted: Orders

## 2012-05-26 NOTE — Assessment & Plan Note (Signed)
Noted on [place]. Chronically elevated AP, beginning in Dec 2011. This may be due to current state, TPN, etc. Mild elevations in transaminases but not impressive. She does have a hx of fatty liver, which is the likely culprit. Consider further evaluation if necessary, but at this point, we will continue to monitor.

## 2012-05-27 ENCOUNTER — Telehealth: Payer: Self-pay

## 2012-05-27 MED ORDER — CIPROFLOXACIN HCL 500 MG PO TABS: 500.0000 mg | ORAL_TABLET | Freq: Two times a day (BID) | ORAL | Status: AC

## 2012-05-27 NOTE — Telephone Encounter (Signed)
Patient aware.

## 2012-05-27 NOTE — Telephone Encounter (Signed)
Antibiotics sent based on enterobacter, sensitivities pending

## 2012-06-10 ENCOUNTER — Telehealth: Payer: Self-pay | Admitting: Family Medicine

## 2012-06-10 ENCOUNTER — Other Ambulatory Visit: Payer: Self-pay

## 2012-06-10 MED ORDER — LIDOCAINE-PRILOCAINE 2.5-2.5 % EX CREA: 1.0000 "application " | TOPICAL_CREAM | CUTANEOUS | Status: DC | PRN

## 2012-06-10 NOTE — Telephone Encounter (Signed)
emla refilled

## 2012-06-10 NOTE — Telephone Encounter (Signed)
Med refilled.

## 2012-06-10 NOTE — Telephone Encounter (Signed)
Please call and verify with pt, Stanton Kidney is an anti-itch cream She has emla cream on her list which can be used for topical anesthesia, - you can refill EMLA if needed

## 2012-06-18 ENCOUNTER — Telehealth: Payer: Self-pay | Admitting: Family Medicine

## 2012-06-18 NOTE — Telephone Encounter (Signed)
Please triage and see what she is describing

## 2012-06-19 NOTE — Telephone Encounter (Signed)
Patient states that she has light vaginal bleeding x 2 days.   No other symptoms.

## 2012-07-06 ENCOUNTER — Ambulatory Visit: Payer: Medicare Other | Admitting: Gastroenterology

## 2012-07-10 ENCOUNTER — Ambulatory Visit: Payer: Medicare Other | Admitting: Urology

## 2012-07-21 ENCOUNTER — Ambulatory Visit (INDEPENDENT_AMBULATORY_CARE_PROVIDER_SITE_OTHER): Payer: Medicare Other | Admitting: Family Medicine

## 2012-07-21 ENCOUNTER — Encounter: Payer: Self-pay | Admitting: Family Medicine

## 2012-07-21 VITALS — BP 110/80 | HR 87 | Resp 16 | Ht 63.0 in | Wt 160.0 lb

## 2012-07-21 DIAGNOSIS — K56609 Unspecified intestinal obstruction, unspecified as to partial versus complete obstruction: Secondary | ICD-10-CM

## 2012-07-21 DIAGNOSIS — N390 Urinary tract infection, site not specified: Secondary | ICD-10-CM

## 2012-07-21 LAB — POCT URINALYSIS DIPSTICK
Bilirubin, UA: NEGATIVE
Glucose, UA: NEGATIVE
Ketones, UA: NEGATIVE
pH, UA: 8.5

## 2012-07-21 MED ORDER — CIPROFLOXACIN HCL 500 MG PO TABS: 500.0000 mg | ORAL_TABLET | Freq: Two times a day (BID) | ORAL | Status: AC

## 2012-07-21 MED ORDER — HYDROMORPHONE HCL 2 MG PO TABS: ORAL_TABLET | ORAL | Status: DC

## 2012-07-21 NOTE — Patient Instructions (Addendum)
Sip small fluids Start antibiotics today Pain medication given I will review records Call Dr. Jena Gauss about the colonoscopy-  Simethecone/Gas-X  F/U 3 months

## 2012-07-23 ENCOUNTER — Encounter: Payer: Self-pay | Admitting: Family Medicine

## 2012-07-23 NOTE — Progress Notes (Signed)
  Subjective:    Patient ID: [name], female    DOB: Jan 25, 1951, 62 y.o.   MRN: [id number]  HPI ]Pt here to with abdominal pain, she was admitted to [facility] Jan 3-14th due to bleeding from PICC line site, UTI, and SBO, antibiotics completed while inpatient, she was also changed to diluadid 2mg  . For the past 4 days she has had worsening abdominal pain, with a lot of gas, her urine has been very foul with mucous in bag, denies bleeding. +pain with eating, +nausea, no emesis, + BM Picc line was changed 2 weeks ago, during admission they removed her port-a cath   Review of Systems - per above   GEN- denies fatigue, fever, weight loss,weakness, recent illness HEENT- denies eye drainage, change in vision, nasal discharge, CVS- denies chest pain, palpitations RESP- denies SOB, cough, wheeze ABD- denies N/V, change in stools,+ abd pain GU- denies dysuria, hematuria, dribbling, incontinence MSK- denies joint pain, muscle aches, injury Neuro- denies headache, dizziness, syncope, seizure activity      Objective:   Physical Exam GEN- NAD, alert and oriented x3 HEENT-PERRL, EOMI, non icteric, MMM CVS- RRR, no murmur RESP-CTAB ABD- NABS, TTP diffusely, ostomy in RLQ with urine bag ,bloated appearing, no rebound, no gaurding EXT- No edema Pulses- Radial, DP- 2+         Assessment & Plan:

## 2012-07-23 NOTE — Assessment & Plan Note (Signed)
Obtain discharge summary, start Cipro, GNR in culture thus far, no signs of sepsis

## 2012-07-23 NOTE — Assessment & Plan Note (Addendum)
Recent bowel obstructions, concern she may have partial SBO now with her bloating and excessive gas Bowel rest, NPO with exception of sips, she understands when to go to ER Pain meds

## 2012-07-24 LAB — URINE CULTURE: Colony Count: >=100000

## 2012-07-27 ENCOUNTER — Other Ambulatory Visit: Payer: Self-pay | Admitting: Internal Medicine

## 2012-07-27 DIAGNOSIS — Z1211 Encounter for screening for malignant neoplasm of colon: Secondary | ICD-10-CM

## 2012-07-27 NOTE — Progress Notes (Signed)
Order has been sent over to [facility] [id number] and they take care of scheduling with the patient, when its scheduled Date & Time will be put into EPIC

## 2012-07-27 NOTE — Progress Notes (Signed)
This is a late entry, but I did review this case with Dr. Jena Gauss after her visit. He is aware she desires a virtual colonoscopy. She expressed desire to wait until Jan 2014 to proceed with this. There may be an out of pocket expense with this exam. If anything is abnormal on the virtual colonoscopy, she will need an optical colonoscopy.    Let's contact pt and see about proceeding with virtual colonoscopy.  Also need to recheck HFP.

## 2012-07-30 ENCOUNTER — Other Ambulatory Visit: Payer: Self-pay | Admitting: Gastroenterology

## 2012-07-30 ENCOUNTER — Other Ambulatory Visit: Payer: Self-pay

## 2012-07-30 DIAGNOSIS — R7989 Other specified abnormal findings of blood chemistry: Secondary | ICD-10-CM

## 2012-07-30 DIAGNOSIS — K760 Fatty (change of) liver, not elsewhere classified: Secondary | ICD-10-CM

## 2012-07-30 NOTE — Progress Notes (Signed)
Mailed letter and lab order to pt. 

## 2012-08-07 NOTE — Progress Notes (Signed)
Patient ID: [name], female   DOB: [birth year], 62 y.o.   MRN: [id number] Patient has opted out from having a virtual colonoscopy at this time, her insurance will not pay for the procedure and she cant afford out of pocket expense at this time

## 2012-08-17 NOTE — Progress Notes (Signed)
Ok. Is she willing to proceed with a colonoscopy? We can offer her an OV. If not, she may contact us when ready to proceed.

## 2012-08-18 NOTE — Progress Notes (Signed)
She wants to think about it and she will contact us when she is ready

## 2012-08-25 ENCOUNTER — Other Ambulatory Visit: Payer: Self-pay

## 2012-08-25 ENCOUNTER — Emergency Department (HOSPITAL_COMMUNITY): Payer: Medicare Other

## 2012-08-25 ENCOUNTER — Other Ambulatory Visit: Payer: Self-pay | Admitting: Family Medicine

## 2012-08-25 ENCOUNTER — Encounter (HOSPITAL_COMMUNITY): Payer: Self-pay | Admitting: Emergency Medicine

## 2012-08-25 ENCOUNTER — Inpatient Hospital Stay (HOSPITAL_COMMUNITY)
Admission: EM | Admit: 2012-08-25 | Discharge: 2012-09-01 | DRG: 389 | Disposition: A | Discharge status: Home Health | Payer: Medicare Other | Attending: Internal Medicine | Admitting: Internal Medicine

## 2012-08-25 ENCOUNTER — Telehealth: Payer: Self-pay | Admitting: Family Medicine

## 2012-08-25 DIAGNOSIS — Z789 Other specified health status: Secondary | ICD-10-CM

## 2012-08-25 DIAGNOSIS — Z882 Allergy status to sulfonamides status: Secondary | ICD-10-CM

## 2012-08-25 DIAGNOSIS — Z886 Allergy status to analgesic agent status: Secondary | ICD-10-CM

## 2012-08-25 DIAGNOSIS — K56609 Unspecified intestinal obstruction, unspecified as to partial versus complete obstruction: Secondary | ICD-10-CM

## 2012-08-25 DIAGNOSIS — Z923 Personal history of irradiation: Secondary | ICD-10-CM

## 2012-08-25 DIAGNOSIS — Z936 Other artificial openings of urinary tract status: Secondary | ICD-10-CM

## 2012-08-25 DIAGNOSIS — Z881 Allergy status to other antibiotic agents status: Secondary | ICD-10-CM

## 2012-08-25 DIAGNOSIS — K219 Gastro-esophageal reflux disease without esophagitis: Secondary | ICD-10-CM | POA: Diagnosis present

## 2012-08-25 DIAGNOSIS — Z906 Acquired absence of other parts of urinary tract: Secondary | ICD-10-CM

## 2012-08-25 DIAGNOSIS — E039 Hypothyroidism, unspecified: Secondary | ICD-10-CM

## 2012-08-25 DIAGNOSIS — K760 Fatty (change of) liver, not elsewhere classified: Secondary | ICD-10-CM

## 2012-08-25 DIAGNOSIS — K565 Intestinal adhesions [bands], unspecified as to partial versus complete obstruction: Principal | ICD-10-CM | POA: Diagnosis present

## 2012-08-25 DIAGNOSIS — K7689 Other specified diseases of liver: Secondary | ICD-10-CM | POA: Diagnosis present

## 2012-08-25 DIAGNOSIS — Z87891 Personal history of nicotine dependence: Secondary | ICD-10-CM

## 2012-08-25 DIAGNOSIS — I10 Essential (primary) hypertension: Secondary | ICD-10-CM

## 2012-08-25 DIAGNOSIS — Z9221 Personal history of antineoplastic chemotherapy: Secondary | ICD-10-CM

## 2012-08-25 DIAGNOSIS — R109 Unspecified abdominal pain: Secondary | ICD-10-CM

## 2012-08-25 DIAGNOSIS — Z8551 Personal history of malignant neoplasm of bladder: Secondary | ICD-10-CM

## 2012-08-25 DIAGNOSIS — D509 Iron deficiency anemia, unspecified: Secondary | ICD-10-CM

## 2012-08-25 DIAGNOSIS — E871 Hypo-osmolality and hyponatremia: Secondary | ICD-10-CM

## 2012-08-25 DIAGNOSIS — N390 Urinary tract infection, site not specified: Secondary | ICD-10-CM

## 2012-08-25 DIAGNOSIS — E785 Hyperlipidemia, unspecified: Secondary | ICD-10-CM | POA: Diagnosis present

## 2012-08-25 DIAGNOSIS — Z880 Allergy status to penicillin: Secondary | ICD-10-CM

## 2012-08-25 DIAGNOSIS — Z79899 Other long term (current) drug therapy: Secondary | ICD-10-CM

## 2012-08-25 DIAGNOSIS — D638 Anemia in other chronic diseases classified elsewhere: Secondary | ICD-10-CM | POA: Diagnosis present

## 2012-08-25 LAB — COMPREHENSIVE METABOLIC PANEL
ALT: 13 U/L (ref 0–35)
AST: 17 U/L (ref 0–37)
Albumin: 3.5 g/dL (ref 3.5–5.2)
Alkaline Phosphatase: 179 U/L — ABNORMAL HIGH (ref 39–117)
BUN: 26 mg/dL — ABNORMAL HIGH (ref 6–23)
CO2: 26 mEq/L (ref 19–32)
Calcium: 9.3 mg/dL (ref 8.4–10.5)
Chloride: 100 mEq/L (ref 96–112)
Creatinine, Ser: 0.89 mg/dL (ref 0.50–1.10)
GFR calc Af Amer: 79 mL/min — ABNORMAL LOW (ref >90)
GFR calc non Af Amer: 69 mL/min — ABNORMAL LOW (ref >90)
Glucose, Bld: 81 mg/dL (ref 70–99)
Potassium: 4 mEq/L (ref 3.5–5.1)
Sodium: 135 mEq/L (ref 135–145)
Total Bilirubin: 0.3 mg/dL (ref 0.3–1.2)
Total Protein: 7.4 g/dL (ref 6.0–8.3)

## 2012-08-25 LAB — URINALYSIS, ROUTINE W REFLEX MICROSCOPIC
Bilirubin Urine: NEGATIVE
Glucose, UA: NEGATIVE mg/dL
Ketones, ur: NEGATIVE mg/dL
Nitrite: NEGATIVE
Protein, ur: 30 mg/dL — AB
Specific Gravity, Urine: 1.015 (ref 1.005–1.030)
Urobilinogen, UA: 0.2 mg/dL (ref 0.0–1.0)
pH: 8.5 — ABNORMAL HIGH (ref 5.0–8.0)

## 2012-08-25 LAB — URINE MICROSCOPIC-ADD ON

## 2012-08-25 LAB — CBC
HCT: 33.7 % — ABNORMAL LOW (ref 36.0–46.0)
Hemoglobin: 12 g/dL (ref 12.0–15.0)
MCH: 26.7 pg (ref 26.0–34.0)
MCHC: 35.6 g/dL (ref 30.0–36.0)
MCV: 75.1 fL — ABNORMAL LOW (ref 78.0–100.0)
Platelets: 371 10*3/uL (ref 150–400)
RBC: 4.49 MIL/uL (ref 3.87–5.11)
RDW: 15.4 % (ref 11.5–15.5)
WBC: 6.2 10*3/uL (ref 4.0–10.5)

## 2012-08-25 LAB — LIPASE, BLOOD: Lipase: 12 U/L (ref 11–59)

## 2012-08-25 MED ORDER — DEXLANSOPRAZOLE 60 MG PO CPDR: DELAYED_RELEASE_CAPSULE | ORAL | Status: DC

## 2012-08-25 MED ORDER — IOHEXOL 300 MG/ML  SOLN
50.0000 mL | Freq: Once | INTRAMUSCULAR | Status: AC | PRN
  Administered 2012-08-25: 50 mL via ORAL

## 2012-08-25 MED ORDER — SODIUM CHLORIDE 0.9 % IV SOLN
INTRAVENOUS | Status: DC
  Administered 2012-08-25: 21:00:00 via INTRAVENOUS

## 2012-08-25 MED ORDER — HYDROMORPHONE HCL PF 1 MG/ML IJ SOLN
1.0000 mg | Freq: Once | INTRAMUSCULAR | Status: AC
  Administered 2012-08-25: 1 mg via INTRAVENOUS
  Filled 2012-08-25: qty 1

## 2012-08-25 MED ORDER — IOHEXOL 300 MG/ML  SOLN
100.0000 mL | Freq: Once | INTRAMUSCULAR | Status: AC | PRN
  Administered 2012-08-25: 100 mL via INTRAVENOUS

## 2012-08-25 MED ORDER — POLYETHYLENE GLYCOL 3350 17 GM/SCOOP PO POWD: ORAL | Status: DC

## 2012-08-25 MED ORDER — ONDANSETRON HCL 4 MG/2ML IJ SOLN
4.0000 mg | Freq: Once | INTRAMUSCULAR | Status: AC
  Administered 2012-08-25: 4 mg via INTRAVENOUS
  Filled 2012-08-25: qty 2

## 2012-08-25 MED ORDER — HEPARIN SOD (PORK) LOCK FLUSH 100 UNIT/ML IV SOLN
INTRAVENOUS | Status: AC
  Administered 2012-08-25: 500 [IU]
  Filled 2012-08-25: qty 5

## 2012-08-25 NOTE — ED Notes (Signed)
Pt sitting on bedside drinking contrast for CT.

## 2012-08-25 NOTE — ED Provider Notes (Signed)
History    62 year old female with gradual onset of upper abdominal pain when she woke up this morning. Associated with distention and nausea. No vomiting. Patient has a past history of bladder cancer status post cystectomy with ileoconduit at [facility] in 2010. She is a past history of multiple small bowel obstructions which have seemed to respond well to NG decompression. Says current symptoms feel similar to previous SBO. No fever or chills. Last BM was earlier today.   CSN: [id number]  Arrival date & time 08/25/12  1191   First MD Initiated Contact with Patient 08/25/12 2004      Chief Complaint  Patient presents with  . Abdominal Pain    (Consider location/radiation/quality/duration/timing/severity/associated sxs/prior treatment) HPI  Past Medical History  Diagnosis Date  . Bowel obstruction several    Recurrent SBO secondary to adhesions  . Hypothyroidism   . Acid reflux   . Chronic back pain   . Hyperlipidemia   . Hypertension     pt says taken off medication since has lost weight.  . Cancer 2011 [facility])    diagnosed in 2009 per pt. Invasive High grade Urothelial carcinoma- s/p radiation and Chemo   . UTI (lower urinary tract infection)     Recurrent  . Malnutrition   . Leukopenia 12/06/2011    HIV serology negative  . Fatty liver     Past Surgical History  Procedure Laterality Date  . Back surgery      X2  . Hernia repair      mesh  . Abdominal surgery      with intestinal "puncture" x 2, exploratory surgery   . Cholecystectomy    . Ileo conduit      For bladder cancer  . Cystectomy      breast  . Portacath placement    . Bladder removal      Family History  Problem Relation Age of Onset  . Heart disease Mother     enlarged  . Hypertension Mother   . Diabetes Father   . Diabetes Sister   . Diabetes Maternal Grandmother   . Colon cancer Neg Hx     History  Substance Use Topics  . Smoking status: Former Smoker    Quit date:  09/26/2010  . Smokeless tobacco: Not on file  . Alcohol Use: No    OB History   Grav Para Term Preterm Abortions TAB SAB Ect Mult Living                  Review of Systems  All systems reviewed and negative, other than as noted in HPI.   Allergies  Bactrim; Codeine; Nitrofuran derivatives; Penicillins; and Sulfa antibiotics  Home Medications   Current Outpatient Rx  Name  Route  Sig  Dispense  Refill  . dexlansoprazole (DEXILANT) 60 MG capsule   Oral   Take 60 mg by mouth daily. TAKE 1 CAPSULE BY MOUTH DAILY         . dextrose 5 % SOLN 50 mL with ceFEPIme 1 G SOLR 1 g   Intravenous   Inject 1 g into the vein every 12 (twelve) hours.         . docusate sodium (COLACE) 100 MG capsule   Oral   Take 100 mg by mouth 2 (two) times daily.          . Hydrocodone-Acetaminophen (VICODIN PO)   Oral   Take 1 tablet by mouth every 6 (six) hours as  needed (for pain).         Marland Kitchen levothyroxine (SYNTHROID, LEVOTHROID) 88 MCG tablet   Oral   Take 1 tablet (88 mcg total) by mouth daily.   30 tablet   3     Increase dose   . Multiple Vitamin (MULITIVITAMIN WITH MINERALS) TABS   Oral   Take 1 tablet by mouth every other day. On Tuesdays, Thursdays, Saturdays, and Sundays. **Administered at bedtime with TPN administration         . polyethylene glycol powder (GLYCOLAX/MIRALAX) powder   Oral   Take 17 g by mouth daily. MIX 1 CAPFUL IN LIQUID AS DIRECTED AND TAKE DAILY         . senna-docusate (SENOKOT-S) 8.6-50 MG per tablet   Oral   Take 1 tablet by mouth 2 (two) times daily.          . TPN NICU   Intravenous   Inject into the vein continuous. Formula copy place in paper chart.   Lipids daily due to use of 3 in 1 bag.            BP 128/73  Pulse 83  Temp(Src) 97.8 F (36.6 C) (Oral)  Resp 18  Ht 5\' 5"  (1.651 m)  Wt 160 lb (72.576 kg)  BMI 26.63 kg/m2  SpO2 100%  Physical Exam  Nursing note and vitals reviewed. Constitutional: She appears  well-developed and well-nourished. No distress.  HENT:  Head: Normocephalic and atraumatic.  Eyes: Conjunctivae are normal. Right eye exhibits no discharge. Left eye exhibits no discharge.  Neck: Neck supple.  Cardiovascular: Normal rate, regular rhythm and normal heart sounds.  Exam reveals no gallop and no friction rub.   No murmur heard. Pulmonary/Chest: Effort normal and breath sounds normal. No respiratory distress.  Abdominal: Soft. She exhibits no distension. There is no tenderness.  Mild diffuse tenderness without guarding or rebound. Distended and tympanitic. Multiple well-healed surgical scars. Right-sided ileostomy  Musculoskeletal: She exhibits no edema and no tenderness.  Neurological: She is alert.  Skin: Skin is warm and dry.  Psychiatric: She has a normal mood and affect. Her behavior is normal. Thought content normal.    ED Course  Procedures (including critical care time)  Labs Reviewed  COMPREHENSIVE METABOLIC PANEL - Abnormal; Notable for the following:    BUN 26 (*)    Alkaline Phosphatase 179 (*)    GFR calc non Af Amer 69 (*)    GFR calc Af Amer 79 (*)    All other components within normal limits  CBC - Abnormal; Notable for the following:    HCT 33.7 (*)    MCV 75.1 (*)    All other components within normal limits  URINALYSIS, ROUTINE W REFLEX MICROSCOPIC - Abnormal; Notable for the following:    APPearance CLOUDY (*)    pH 8.5 (*)    Hgb urine dipstick TRACE (*)    Protein, ur 30 (*)    Leukocytes, UA LARGE (*)    All other components within normal limits  URINE MICROSCOPIC-ADD ON - Abnormal; Notable for the following:    Bacteria, UA MANY (*)    Crystals TRIPLE PHOSPHATE CRYSTALS (*)    All other components within normal limits  URINE CULTURE  LIPASE, BLOOD   Ct Abdomen Pelvis W Contrast  08/25/2012  *RADIOLOGY REPORT*  Clinical Data: Abdominal pain.  History of recurrent small bowel obstruction due to adhesions.  CT ABDOMEN AND PELVIS WITH  CONTRAST  Technique:  Multidetector CT  imaging of the abdomen and pelvis was performed following the standard protocol during bolus administration of intravenous contrast.  Contrast: 50mL OMNIPAQUE IOHEXOL 300 MG/ML  SOLN, OMNIPAQUE IOHEXOL 300 MG/ML  SOLN  Comparison: 09/27/2011  Findings: Atelectasis in both lung bases.  There is diffuse intra and extrahepatic bile duct dilatation, similar to previous study.  No obstructing mass lesion or stone is identified.  This may be related postoperative physiology.  No focal liver lesions.  Spleen size is normal.  Pancreas, adrenal glands, kidneys, and retroperitoneal lymph nodes are unremarkable. Calcification of the abdominal aorta without aneurysm.  There is marked distension of small bowel with decompression of the terminal ileum.  Transition zone appears to be in the area of scarring in the anterior abdominal wall at the midline.  This is associated with surgical clips and dystrophic calcification.  The appearance is similar to previous study.  The colon is decompressed and displaced.  There is a right lower quadrant ostomy, likely ileal conduit.  No free air or free fluid is suggested in the abdomen.  Pelvis: Surgical absence of the bladder.  A small amount of free fluid in the pelvis.  Rectosigmoid colon is decompressed.  Appendix appears to be surgically absent.  Degenerative changes in the lumbar spine with normal alignment.  IMPRESSION: Small bowel obstruction with diffuse distension of predominately fluid-filled small bowel loops and decompression of the ileum. Transition zone appears to be at the midline of the lower abdomen and an area of surgical scarring suggesting adhesions.  Appearance is similar to previous study.   Original Report Authenticated By: Burman Nieves, M.D.      1. SBO (small bowel obstruction)       MDM  62 year old female with symptoms and exam consistent with a bowel obstruction. CT of the abdomen and pelvis confirms this.  Will place NG tube for decompression. Admission.        Raeford Razor, MD 08/26/12 0030

## 2012-08-25 NOTE — Telephone Encounter (Signed)
meds refilled 

## 2012-08-25 NOTE — ED Notes (Signed)
Patient c/o upper abdominal pain since she got up this morning.  Patient denies N/V/D.  States she has chronic bowel obstructions; states last admission was January 3 at [facility].

## 2012-08-25 NOTE — ED Notes (Signed)
Pt has finished the first of two bottles of contrast. No nausea or discomfort reported.

## 2012-08-25 NOTE — ED Notes (Signed)
Pt c/o upper abdominal pain since this am. States it has gotten progressively worse throughout the day. No nausea or tenderness across abdomen.

## 2012-08-25 NOTE — ED Notes (Signed)
Pt refusing NGT at this time. States she wants to hold off on placing it until later. Dr. Juleen China notified.

## 2012-08-26 ENCOUNTER — Encounter (HOSPITAL_COMMUNITY): Payer: Self-pay | Admitting: *Deleted

## 2012-08-26 DIAGNOSIS — N390 Urinary tract infection, site not specified: Secondary | ICD-10-CM

## 2012-08-26 DIAGNOSIS — K56609 Unspecified intestinal obstruction, unspecified as to partial versus complete obstruction: Secondary | ICD-10-CM

## 2012-08-26 DIAGNOSIS — I10 Essential (primary) hypertension: Secondary | ICD-10-CM

## 2012-08-26 DIAGNOSIS — D509 Iron deficiency anemia, unspecified: Secondary | ICD-10-CM

## 2012-08-26 LAB — FOLATE: Folate: >20 ng/mL

## 2012-08-26 LAB — BASIC METABOLIC PANEL
BUN: 23 mg/dL (ref 6–23)
Calcium: 8.8 mg/dL (ref 8.4–10.5)
GFR calc Af Amer: 71 mL/min — ABNORMAL LOW (ref >90)
GFR calc non Af Amer: 61 mL/min — ABNORMAL LOW (ref >90)
Glucose, Bld: 87 mg/dL (ref 70–99)
Potassium: 4.4 mEq/L (ref 3.5–5.1)
Sodium: 135 mEq/L (ref 135–145)

## 2012-08-26 LAB — URINE CULTURE: Colony Count: 80000

## 2012-08-26 LAB — CBC
Hemoglobin: 11 g/dL — ABNORMAL LOW (ref 12.0–15.0)
MCH: 26.6 pg (ref 26.0–34.0)
MCHC: 35.1 g/dL (ref 30.0–36.0)
Platelets: 347 10*3/uL (ref 150–400)

## 2012-08-26 LAB — VITAMIN B12: Vitamin B-12: 455 pg/mL (ref 211–911)

## 2012-08-26 LAB — RETICULOCYTES
RBC.: 4.11 MIL/uL (ref 3.87–5.11)
Retic Count, Absolute: 57.5 10*3/uL (ref 19.0–186.0)
Retic Ct Pct: 1.4 % (ref 0.4–3.1)

## 2012-08-26 LAB — IRON AND TIBC: Saturation Ratios: 15 % — ABNORMAL LOW (ref 20–55)

## 2012-08-26 MED ORDER — LEVOTHYROXINE SODIUM 100 MCG IV SOLR
44.0000 ug | Freq: Every day | INTRAVENOUS | Status: DC
  Administered 2012-08-26 – 2012-08-31 (×6): 44 ug via INTRAVENOUS
  Filled 2012-08-26 (×8): qty 5

## 2012-08-26 MED ORDER — DOCUSATE SODIUM 100 MG PO CAPS
100.0000 mg | ORAL_CAPSULE | Freq: Two times a day (BID) | ORAL | Status: DC
  Administered 2012-08-26: 100 mg via ORAL
  Filled 2012-08-26: qty 1

## 2012-08-26 MED ORDER — SENNOSIDES-DOCUSATE SODIUM 8.6-50 MG PO TABS
1.0000 | ORAL_TABLET | Freq: Two times a day (BID) | ORAL | Status: DC
  Administered 2012-08-26: 1 via ORAL
  Filled 2012-08-26: qty 1

## 2012-08-26 MED ORDER — STERILE WATER FOR INJECTION IV SOLN
INTRAVENOUS | Status: AC
  Administered 2012-08-26: 22:00:00 via INTRAVENOUS

## 2012-08-26 MED ORDER — SODIUM CHLORIDE 0.9 % IV SOLN
Freq: Once | INTRAVENOUS | Status: AC
  Administered 2012-08-26: 02:00:00 via INTRAVENOUS

## 2012-08-26 MED ORDER — POTASSIUM CHLORIDE IN NACL 20-0.9 MEQ/L-% IV SOLN
INTRAVENOUS | Status: DC
  Administered 2012-08-26 – 2012-08-30 (×12): via INTRAVENOUS
  Administered 2012-08-31: 100 mL/h via INTRAVENOUS

## 2012-08-26 MED ORDER — HYDROMORPHONE HCL PF 1 MG/ML IJ SOLN
0.5000 mg | INTRAMUSCULAR | Status: DC | PRN
  Administered 2012-08-26 – 2012-08-29 (×10): 1 mg via INTRAVENOUS
  Administered 2012-08-29: 0.5 mg via INTRAVENOUS
  Administered 2012-08-31 – 2012-09-01 (×2): 1 mg via INTRAVENOUS
  Filled 2012-08-26 (×15): qty 1

## 2012-08-26 MED ORDER — ENOXAPARIN SODIUM 40 MG/0.4ML ~~LOC~~ SOLN
40.0000 mg | SUBCUTANEOUS | Status: DC
  Administered 2012-08-26 – 2012-08-31 (×6): 40 mg via SUBCUTANEOUS
  Filled 2012-08-26 (×6): qty 0.4

## 2012-08-26 MED ORDER — PANTOPRAZOLE SODIUM 40 MG IV SOLR
40.0000 mg | INTRAVENOUS | Status: DC
  Administered 2012-08-26 – 2012-09-01 (×7): 40 mg via INTRAVENOUS
  Filled 2012-08-26 (×7): qty 40

## 2012-08-26 MED ORDER — DIPHENHYDRAMINE HCL 50 MG/ML IJ SOLN
12.5000 mg | INTRAMUSCULAR | Status: DC | PRN
  Administered 2012-08-26 – 2012-08-31 (×9): 12.5 mg via INTRAVENOUS
  Filled 2012-08-26 (×11): qty 1

## 2012-08-26 MED ORDER — ONDANSETRON HCL 4 MG/2ML IJ SOLN
4.0000 mg | INTRAMUSCULAR | Status: DC | PRN
  Administered 2012-08-28 – 2012-08-31 (×2): 4 mg via INTRAVENOUS
  Filled 2012-08-26 (×3): qty 2

## 2012-08-26 MED ORDER — NON FORMULARY: 2850.0000 mL | Status: DC

## 2012-08-26 MED ORDER — DEXTROSE 5 % IV SOLN
INTRAVENOUS | Status: AC
  Filled 2012-08-26: qty 1

## 2012-08-26 MED ORDER — DIPHENHYDRAMINE HCL 50 MG/ML IJ SOLN
12.5000 mg | Freq: Once | INTRAMUSCULAR | Status: AC
  Administered 2012-08-26: 12.5 mg via INTRAVENOUS
  Filled 2012-08-26: qty 1

## 2012-08-26 MED ORDER — DEXTROSE 5 % IV SOLN
1.0000 g | Freq: Two times a day (BID) | INTRAVENOUS | Status: DC
  Administered 2012-08-26 – 2012-08-29 (×8): 1 g via INTRAVENOUS
  Filled 2012-08-26 (×12): qty 1

## 2012-08-26 NOTE — Plan of Care (Signed)
Problem: Problem: Bowel/Bladder Progression Goal: BOWEL SOUNDS PRESENT/NON-DECREASED Outcome: Not Progressing Pt bowel sound hypoactive with abdominal distention &upper abdominal sharp pains. Last BM 2/24 Goal: PATIENT IS CONTINENT Outcome: Progressing Pt is alert & if she should have BM  Pt voids via illeal conduit to BSD. Goal: OTHER BOWEL GOAL(S) Outcome: Not Progressing Pt still politely refusing NG to decompress bowels.

## 2012-08-26 NOTE — Progress Notes (Signed)
PARENTERAL NUTRITION CONSULT NOTE - INITIAL  Pharmacy Consult for TPN Indication: SBO  Allergies  Allergen Reactions  . Bactrim (Sulfamethoxazole W-Trimethoprim)   . Codeine Hives  . Nitrofuran Derivatives Itching  . Penicillins Hives  . Sulfa Antibiotics Hives    Patient Measurements: Height: 5\' 5"  (165.1 cm) Weight: 163 lb 2.3 oz (74 kg) IBW/kg (Calculated) : 57  Vital Signs: Temp: 98.1 F (36.7 C) (02/26 1423) Temp src: Oral (02/26 1423) BP: 95/42 mmHg (02/26 1423) Pulse Rate: 67 (02/26 1423) Intake/Output from previous day: 02/25 0701 - 02/26 0700 In: -  Out: 450 [Urine:450] Intake/Output from this shift:    Labs:  Recent Labs  08/25/12 2034 08/26/12 0518  WBC 6.2 4.8  HGB 12.0 11.0*  HCT 33.7* 31.3*  PLT 371 347     Recent Labs  08/25/12 2034 08/26/12 0518  NA 135 135  K 4.0 4.4  CL 100 102  CO2 26 25  GLUCOSE 81 87  BUN 26* 23  CREATININE 0.89 0.98  CALCIUM 9.3 8.8  PROT 7.4  --   ALBUMIN 3.5  --   AST 17  --   ALT 13  --   ALKPHOS 179*  --   BILITOT 0.3  --    Estimated Creatinine Clearance: 60.7 ml/min (by C-G formula based on Cr of 0.98).   No results found for this basename: GLUCAP,  in the last 72 hours  Medical History: Past Medical History  Diagnosis Date  . Bowel obstruction several    Recurrent SBO secondary to adhesions  . Hypothyroidism   . Acid reflux   . Chronic back pain   . Hyperlipidemia   . Hypertension     pt says taken off medication since has lost weight.  . Cancer 2011 [facility])    diagnosed in 2009 per pt. Invasive High grade Urothelial carcinoma- s/p radiation and Chemo   . UTI (lower urinary tract infection)     Recurrent  . Malnutrition   . Leukopenia 12/06/2011    HIV serology negative  . Fatty liver     Medications:  Scheduled:  . [COMPLETED] sodium chloride   Intravenous Once  . ceFEPIme (MAXIPIME) 1 GM IVPB  1 g Intravenous Q12H  . [COMPLETED] diphenhydrAMINE  12.5 mg Intravenous Once   . enoxaparin (LOVENOX) injection  40 mg Subcutaneous Q24H  . [COMPLETED] heparin lock flush      . [COMPLETED] HYDROmorphone  1 mg Intravenous Once  . [COMPLETED] HYDROmorphone  1 mg Intravenous Once  . levothyroxine  44 mcg Intravenous Daily  . [COMPLETED] ondansetron  4 mg Intravenous Once  . pantoprazole (PROTONIX) IV  40 mg Intravenous Q24H  . [DISCONTINUED] docusate sodium  100 mg Oral BID  . [DISCONTINUED] NON FORMULARY 2,850 mL  2,850 mL Intravenous (Continuous Infusion) Q24H  . [DISCONTINUED] senna-docusate  1 tablet Oral BID    Insulin Requirements in the past 24 hours:  none  Current Nutrition:  TPN  Assessment: 62 yo F with recurrent small bowel obstructions after cystectomy and ileal conduit for bladder cancer in 2010.  As a result she has been unable to get adequate nutritional intake via GI tract without repeat obstructions & has been receiving TPN since August 2012.  She is followed at [facility].    Her electrolytes are normal today.  She is able to provide explicit information about her home TPN.   Since she is well-controlled on her outpatient TPN and this hospital cannot provide the same formulation due  to national backorders will continue to use her home supply  Nutritional Goals:  25 kCal/kg/day, 0.8 grams/kg of protein per day  Plan:  1) Patient's home TPN formula infused for 12 hours nightly at 237.8ml/hr.  Formula contains electrolytes & trace elements daily.  Multivitamins added on MWF.  Lipids on Monday & Friday.  2) Monitor labs per protocol  Elson Clan 08/26/2012,4:02 PM

## 2012-08-26 NOTE — H&P (Signed)
[facility] History and Physical  [name]  ZOX:[id number]  DOB: 17-Aug-1950   DOA: 08/26/2012   PCP:   Milinda Antis, MD   Chief Complaint:  Progressive abdominal pain and swelling today   HPI: [name] is an 62 y.o. female.   Pleasant middle-aged African American lady status post cystectomy and ileal conduit for bladder cancer in 2010.  Since then has had recurrent episodes of small bowel obstruction treated conservatively with "drip and suck". She had a nine-month symptom free. Until January 2014 when she was readmitted to [facility] for small bowel obstruction, and uti.. because of recurrent bowel problems she is unable to get adequate nutritional intake via GI tract without subsequent obstruction, so has a PICC line and has been receiving TPN since August 2012.  Has been having nausea and colicky abdominal pain, but no vomiting. Denies fever or chills.  CT scan of the abdomen shows dilated  fluid-filled small bowel with decompressed ileum.    Rewiew of Systems:   All systems negative except as marked bold or noted in the HPI;  Constitutional:    malaise, fever and chills. ;  Eyes:   eye pain, redness and discharge. ;  ENMT:   ear pain, hoarseness, nasal congestion, sinus pressure and sore throat. ;  Cardiovascular:    chest pain, palpitations, diaphoresis, dyspnea and peripheral edema.  Respiratory:   cough, hemoptysis, wheezing and stridor. ;  Gastrointestinal:  vomiting, diarrhea, constipation, melena, blood in stool, hematemesis, jaundice and rectal bleeding. unusual weight loss..   Genitourinary:    frequency, dysuria, incontinence,flank pain and hematuria; Musculoskeletal:   back pain and neck pain.  swelling and trauma.;  Skin: .  pruritus, rash, abrasions, bruising and skin lesion.; ulcerations Neuro:    headache, lightheadedness and neck stiffness.  weakness, altered level of consciousness, altered mental status, extremity weakness, burning feet,  involuntary movement, seizure and syncope.  Psych:    anxiety, depression, insomnia, tearfulness, panic attacks, hallucinations, paranoia, suicidal or homicidal ideation    Past Medical History  Diagnosis Date  . Bowel obstruction several    Recurrent SBO secondary to adhesions  . Hypothyroidism   . Acid reflux   . Chronic back pain   . Hyperlipidemia   . Hypertension     pt says taken off medication since has lost weight.  . Cancer 2011 [facility])    diagnosed in 2009 per pt. Invasive High grade Urothelial carcinoma- s/p radiation and Chemo   . UTI (lower urinary tract infection)     Recurrent  . Malnutrition   . Leukopenia 12/06/2011    HIV serology negative  . Fatty liver     Past Surgical History  Procedure Laterality Date  . Back surgery      X2  . Hernia repair      mesh  . Abdominal surgery      with intestinal "puncture" x 2, exploratory surgery   . Cholecystectomy    . Ileo conduit      For bladder cancer  . Cystectomy      breast  . Portacath placement    . Bladder removal      Medications:  HOME MEDS: Prior to Admission medications   Medication Sig Start Date End Date Taking? Authorizing Provider  dexlansoprazole (DEXILANT) 60 MG capsule Take 60 mg by mouth daily. TAKE 1 CAPSULE BY MOUTH DAILY 08/25/12  Yes Salley Scarlet, MD  dextrose 5 % SOLN 50 mL with ceFEPIme 1 G SOLR  1 g Inject 1 g into the vein every 12 (twelve) hours. 12/07/11  Yes Elliot Cousin, MD  docusate sodium (COLACE) 100 MG capsule Take 100 mg by mouth 2 (two) times daily.    Yes Historical Provider, MD  Hydrocodone-Acetaminophen (VICODIN PO) Take 1 tablet by mouth every 6 (six) hours as needed (for pain).   Yes Historical Provider, MD  levothyroxine (SYNTHROID, LEVOTHROID) 88 MCG tablet Take 1 tablet (88 mcg total) by mouth daily. 05/23/12  Yes Salley Scarlet, MD  Multiple Vitamin (MULITIVITAMIN WITH MINERALS) TABS Take 1 tablet by mouth every other day. On Tuesdays, Thursdays,  Saturdays, and Sundays. **Administered at bedtime with TPN administration   Yes Historical Provider, MD  polyethylene glycol powder (GLYCOLAX/MIRALAX) powder Take 17 g by mouth daily. MIX 1 CAPFUL IN LIQUID AS DIRECTED AND TAKE DAILY 08/25/12  Yes Salley Scarlet, MD  senna-docusate (SENOKOT-S) 8.6-50 MG per tablet Take 1 tablet by mouth 2 (two) times daily.    Yes Historical Provider, MD  TPN NICU Inject into the vein continuous. Formula copy place in paper chart.   Lipids daily due to use of 3 in 1 bag.    Yes Historical Provider, MD     Allergies:  Allergies  Allergen Reactions  . Bactrim (Sulfamethoxazole W-Trimethoprim)   . Codeine Hives  . Nitrofuran Derivatives Itching  . Penicillins Hives  . Sulfa Antibiotics Hives    Social History:   reports that she quit smoking about 23 months ago. She does not have any smokeless tobacco history on file. She reports that she does not drink alcohol or use illicit drugs.  Family History: Family History  Problem Relation Age of Onset  . Heart disease Mother     enlarged  . Hypertension Mother   . Diabetes Father   . Diabetes Sister   . Diabetes Maternal Grandmother   . Colon cancer Neg Hx      Physical Exam: Filed Vitals:   08/25/12 2128 08/26/12 0006 08/26/12 0146 08/26/12 0218  BP: 128/73 119/64 103/61 110/71  Pulse: 83 72 65 74  Temp: 97.8 F (36.6 C)   97.7 F (36.5 C)  TempSrc: Oral   Oral  Resp: 18 16 14 16   Height:    5\' 5"  (1.651 m)  Weight:    74 kg (163 lb 2.3 oz)  SpO2: 100% 98% 97% 97%   Blood pressure 110/71, pulse 74, temperature 97.7 F (36.5 C), temperature source Oral, resp. rate 16, height 5\' 5"  (1.651 m), weight 74 kg (163 lb 2.3 oz), SpO2 97.00%.  GEN:  Pleasant middle-aged African American lady reclining in the stretcher in obvious discomfort; cooperative with exam PSYCH:  alert and oriented x4; does not appear anxious or depressed; affect is appropriate. HEENT: Mucous membranes pink, dry and  anicteric; PERRLA; EOM intact; no cervical lymphadenopathy nor thyromegaly or carotid bruit; no JVD; Breasts:: Not examined CHEST WALL: No tenderness CHEST: Normal respiration, clear to auscultation bilaterally HEART: Regular rate and rhythm; no murmurs rubs or gallops BACK:  no CVA tenderness ABDOMEN: Distended, diffusely tender; right lower quadrant ileal conduit pouch filled with clear urine; increased abdominal bowel sounds; no pannus; no intertriginous candida. Rectal Exam: Not done EXTREMITIES:  age-appropriate arthropathy of the hands and knees; no edema; no ulcerations. Genitalia: not examined PULSES: 2+ and symmetric SKIN: Normal hydration no rash or ulceration CNS: Cranial nerves 2-12 grossly intact no focal lateralizing neurologic deficit   Labs on Admission:  Basic Metabolic Panel:  Recent Labs Lab  08/25/12 2034  NA 135  K 4.0  CL 100  CO2 26  GLUCOSE 81  BUN 26*  CREATININE 0.89  CALCIUM 9.3   Liver Function Tests:  Recent Labs Lab 08/25/12 2034  AST 17  ALT 13  ALKPHOS 179*  BILITOT 0.3  PROT 7.4  ALBUMIN 3.5    Recent Labs Lab 08/25/12 2034  LIPASE 12   No results found for this basename: AMMONIA,  in the last 168 hours CBC:  Recent Labs Lab 08/25/12 2034  WBC 6.2  HGB 12.0  HCT 33.7*  MCV 75.1*  PLT 371   Cardiac Enzymes: No results found for this basename: CKTOTAL, CKMB, CKMBINDEX, TROPONINI,  in the last 168 hours BNP: No components found with this basename: POCBNP,  D-dimer: No components found with this basename: D-DIMER,  CBG: No results found for this basename: GLUCAP,  in the last 168 hours  Radiological Exams on Admission: Ct Abdomen Pelvis W Contrast  08/25/2012  *RADIOLOGY REPORT*  Clinical Data: Abdominal pain.  History of recurrent small bowel obstruction due to adhesions.  CT ABDOMEN AND PELVIS WITH CONTRAST  Technique:  Multidetector CT imaging of the abdomen and pelvis was performed following the standard protocol  during bolus administration of intravenous contrast.  Contrast: 50mL OMNIPAQUE IOHEXOL 300 MG/ML  SOLN, OMNIPAQUE IOHEXOL 300 MG/ML  SOLN  Comparison: 09/27/2011  Findings: Atelectasis in both lung bases.  There is diffuse intra and extrahepatic bile duct dilatation, similar to previous study.  No obstructing mass lesion or stone is identified.  This may be related postoperative physiology.  No focal liver lesions.  Spleen size is normal.  Pancreas, adrenal glands, kidneys, and retroperitoneal lymph nodes are unremarkable. Calcification of the abdominal aorta without aneurysm.  There is marked distension of small bowel with decompression of the terminal ileum.  Transition zone appears to be in the area of scarring in the anterior abdominal wall at the midline.  This is associated with surgical clips and dystrophic calcification.  The appearance is similar to previous study.  The colon is decompressed and displaced.  There is a right lower quadrant ostomy, likely ileal conduit.  No free air or free fluid is suggested in the abdomen.  Pelvis: Surgical absence of the bladder.  A small amount of free fluid in the pelvis.  Rectosigmoid colon is decompressed.  Appendix appears to be surgically absent.  Degenerative changes in the lumbar spine with normal alignment.  IMPRESSION: Small bowel obstruction with diffuse distension of predominately fluid-filled small bowel loops and decompression of the ileum. Transition zone appears to be at the midline of the lower abdomen and an area of surgical scarring suggesting adhesions.  Appearance is similar to previous study.   Original Report Authenticated By: Burman Nieves, M.D.      Assessment/Plan Present on Admission:  . SBO (small bowel obstruction) . Recurrent UTI,  now with abnormal urinalysis , receiving daily self-administered intravenous cefepime  . Microcytic anemia  . HYPERLIPIDEMIA . HYPERTENSION . HYPOTHYROIDISM  PLAN:  since this patient is  insisting she wants an NG tube to be postponed until tomorrow, we have explained to her the risks associated with getting narcotic pain medication without decompressing the bowel; specifically the risks of perforation were discussed. She continues to insist she does not want an NG tube at this time.  We'll get high volume IV fluids, and continue pain management.  Continue intravenous cefepime pending the results of urine culture.  Consult to pharmacist for TPN, although  patient notes that she has bags of TPN at home that she is already purchased.  Transitionl outpatient medications IV.  NG tube in the morning;   surgical consult prn  Other plans as per orders.  Code Status: FULL CODE  Family Communication: Interview examination and plans discussed with patient and daughter at bedside Disposition Plan:  review his status in the morning and make further decision    Stevenson Windmiller Nocturnist [facility] Pager [phone]   08/26/2012, 2:48 AM

## 2012-08-26 NOTE — Progress Notes (Signed)
UR Chart Review Completed  

## 2012-08-26 NOTE — Care Management Note (Signed)
    Page 1 of 2   09/01/2012     2:24:12 PM   CARE MANAGEMENT NOTE 09/01/2012  Patient:  [name], [name]   Account Number:  [id number]  Date Initiated:  08/26/2012  Documentation initiated by:  Rosemary Holms  Subjective/Objective Assessment:   Pt admitted from home where she lives with her children. Pt gets TPN through [facility] and [facility]. Hx of bladder Ca. Has UTI. Has PICC line.     Action/Plan:   Return home with existing services   Anticipated DC Date:  09/01/2012   Anticipated DC Plan:  HOME W HOME HEALTH SERVICES      DC Planning Services  CM consult      Choice offered to / List presented to:          [facility] arranged  HH-1 RN      [facility] agency  [facility] Care   Status of service:  Completed, signed off Medicare Important Message given?   (If response is "NO", the following Medicare IM given date fields will be blank) Date Medicare IM given:   Date Additional Medicare IM given:    Discharge Disposition:  HOME W HOME HEALTH SERVICES  Per UR Regulation:    If discussed at Long Length of Stay Meetings, dates discussed:   09/08/2012    Comments:  09/01/12 Rosemary Holms RN BSN CM Pt's labs, DC summary and Pharmacy note faxed to Katrina at [facility] and formula. She will get in touch with [facility] if needed.  08/31/12 Rosemary Holms RN BSN CM Spoke to pt at bedside. Concerned that her PICC line care happens on Tuesdays and if she is DC'd she will miss it. Dr. notified and will have [facility] staff do PICC care. Also Blood work needs to be drawn and faxed to [facility] lab. Include Chem 18, WBC, Hmg/Hct/Plts, Hep Panel. Fax results to [phone].  08/28/12 Rosemary Holms RN BSN CM Tonight is the last TPN from [facility] supplied by pt. Pharmacy alerted and has number for pharmacist at [facility] if any questions arise [phone], opt 1) RN Jeanella Flattery will call Infectious Disease to see if pt can come off Isolation precauthions.   08/26/12 Amy Leanord Hawking RN BSN CM Called by [facility]/TPN infusion RN. When pt is close to DC, need to contact Katrina Greely with Formula and Labs. Cell [phone], Fax [phone] Pager: [phone]. Brett Canales at [place] will talk with [facility] also.

## 2012-08-26 NOTE — Progress Notes (Signed)
[facility] PROGRESS NOTE  [name] YQM:[id number] DOB: 03-20-1951 DOA: 08/25/2012 PCP: Milinda Antis, MD  Assessment/Plan: SBO (small bowel obstruction): hx of same likely since cystectomy and ileal conduit for bladder cancer. Presumably due to adhesions. Pt states in past resolves with NG tube but she declines NG at this time. Pain improved this am. Pt reports that in past she continues her colace, senekot and miralax and ambulates. She insists that that plan has worked in the past and that she needs those meds to improve.  Explained NPO status and concerns of taking meds and risks. Pt given one dose colace and senekot. Dr. Kerry Hough in to see patient and re-explained purpose of NPO. Senekot and colace discontinued   Active Problems:   HYPOTHYROIDISM: TSH 10 3 months ago. Current level pending. Will continue IV synthroid.    Recurrent UTI: culture pending. Continue cefepime day #2. Afebrile. WC WNL. Non-toxic appearing.     Microcytic anemia: anemia panel pending. Likely related to chronic disease. Chart review indicates current level at baseline.       HYPERTENSION: controlled. Not on anti-hypertensive at home    Hx of bladder cancer: s/p cystectomy and ileal conduit. Stable    On total parenteral nutrition (TPN): for last 18 months. Per pharmacy    Code Status: full Family Communication: family at bedside Disposition Plan: home when ready likely 2-3 days   Consultants:  none  Procedures:  none  Antibiotics:  cefipime 08/25/12>>>  HPI/Subjective: Awake alert reports improved pain. No passing of gas, no BM. Continues to refuse NG.   Objective: Filed Vitals:   08/26/12 0006 08/26/12 0146 08/26/12 0218 08/26/12 0534  BP: 119/64 103/61 110/71 91/57  Pulse: 72 65 74 71  Temp:   97.7 F (36.5 C) 98.1 F (36.7 C)  TempSrc:   Oral Oral  Resp: 16 14 16 18   Height:   5\' 5"  (1.651 m)   Weight:   74 kg (163 lb 2.3 oz) 74 kg (163 lb 2.3 oz)  SpO2: 98% 97% 97%  97%    Intake/Output Summary (Last 24 hours) at 08/26/12 0954 Last data filed at 08/25/12 2337  Gross per 24 hour  Intake      0 ml  Output    450 ml  Net   -450 ml   Filed Weights   08/25/12 1947 08/26/12 0218 08/26/12 0534  Weight: 72.576 kg (160 lb) 74 kg (163 lb 2.3 oz) 74 kg (163 lb 2.3 oz)    Exam:   General:  Awake alert NAD  Cardiovascular: RRR No MGR No LEE  Respiratory: normal effort BSCTAB No rhonchi/wheeze  Abdomen: distended, slightly firm, very sluggish BS non-tender to palpation, no guarding  Data Reviewed: Basic Metabolic Panel:  Recent Labs Lab 08/25/12 2034 08/26/12 0518  NA 135 135  K 4.0 4.4  CL 100 102  CO2 26 25  GLUCOSE 81 87  BUN 26* 23  CREATININE 0.89 0.98  CALCIUM 9.3 8.8   Liver Function Tests:  Recent Labs Lab 08/25/12 2034  AST 17  ALT 13  ALKPHOS 179*  BILITOT 0.3  PROT 7.4  ALBUMIN 3.5    Recent Labs Lab 08/25/12 2034  LIPASE 12   No results found for this basename: AMMONIA,  in the last 168 hours CBC:  Recent Labs Lab 08/25/12 2034 08/26/12 0518  WBC 6.2 4.8  HGB 12.0 11.0*  HCT 33.7* 31.3*  MCV 75.1* 75.6*  PLT 371 347   Cardiac Enzymes: No results  found for this basename: CKTOTAL, CKMB, CKMBINDEX, TROPONINI,  in the last 168 hours BNP (last 3 results) No results found for this basename: PROBNP,  in the last 8760 hours CBG: No results found for this basename: GLUCAP,  in the last 168 hours  No results found for this or any previous visit (from the past 240 hour(s)).   Studies: Ct Abdomen Pelvis W Contrast  08/25/2012  *RADIOLOGY REPORT*  Clinical Data: Abdominal pain.  History of recurrent small bowel obstruction due to adhesions.  CT ABDOMEN AND PELVIS WITH CONTRAST  Technique:  Multidetector CT imaging of the abdomen and pelvis was performed following the standard protocol during bolus administration of intravenous contrast.  Contrast: 50mL OMNIPAQUE IOHEXOL 300 MG/ML  SOLN, OMNIPAQUE IOHEXOL  300 MG/ML  SOLN  Comparison: 09/27/2011  Findings: Atelectasis in both lung bases.  There is diffuse intra and extrahepatic bile duct dilatation, similar to previous study.  No obstructing mass lesion or stone is identified.  This may be related postoperative physiology.  No focal liver lesions.  Spleen size is normal.  Pancreas, adrenal glands, kidneys, and retroperitoneal lymph nodes are unremarkable. Calcification of the abdominal aorta without aneurysm.  There is marked distension of small bowel with decompression of the terminal ileum.  Transition zone appears to be in the area of scarring in the anterior abdominal wall at the midline.  This is associated with surgical clips and dystrophic calcification.  The appearance is similar to previous study.  The colon is decompressed and displaced.  There is a right lower quadrant ostomy, likely ileal conduit.  No free air or free fluid is suggested in the abdomen.  Pelvis: Surgical absence of the bladder.  A small amount of free fluid in the pelvis.  Rectosigmoid colon is decompressed.  Appendix appears to be surgically absent.  Degenerative changes in the lumbar spine with normal alignment.  IMPRESSION: Small bowel obstruction with diffuse distension of predominately fluid-filled small bowel loops and decompression of the ileum. Transition zone appears to be at the midline of the lower abdomen and an area of surgical scarring suggesting adhesions.  Appearance is similar to previous study.   Original Report Authenticated By: Burman Nieves, M.D.     Scheduled Meds: . ceFEPIme (MAXIPIME) 1 GM IVPB  1 g Intravenous Q12H  . enoxaparin (LOVENOX) injection  40 mg Subcutaneous Q24H  . levothyroxine  44 mcg Intravenous Daily  . pantoprazole (PROTONIX) IV  40 mg Intravenous Q24H   Continuous Infusions: . 0.9 % NaCl with KCl 20 mEq / L 150 mL/hr at 08/26/12 0327    Principal Problem:       Time spent: 35 minutes    [facility]  [facility]  If  8PM-8AM, please contact night-coverage at [email or web address], password [facility] 08/26/2012, 9:54 AM  LOS: 1 day   Attending note:  Patient seen and examined.  Agree with note as above. Appreciate GI assistance.

## 2012-08-26 NOTE — Consult Note (Signed)
Reason for Consult:small bowel obstruction Referring Physician: Hospitalist  [name] is an 62 y.o. female.  HPI: Admitted last night with upper abdominal pain. Abdominal pain started yesterday. She tells me she could not lie down or sit up.  The pain would occur every 15 minutes. She has a hx of recurrent SBO secondary to adhesions.  She tells me usually after she vomits, the pain will resolve, but it did not resolve yesterday.  She is a patient of Dr. Alfonso Patten. No nausea or vomiting this am.  She had 3 BMs yesterday morning but none since.  No melena or bright red rectal bleeding. Her abdominal pain is better.  Her last colonoscopy was years ago by Dr. Katrinka Blazing and she tells me it was normal.  Presently taking TPN 9pm till 930am daily x 18 months because of her small bowel obstruction.   Past Medical History  Diagnosis Date  . Bowel obstruction several    Recurrent SBO secondary to adhesions  . Hypothyroidism   . Acid reflux   . Chronic back pain   . Hyperlipidemia   . Hypertension     pt says taken off medication since has lost weight.  . Cancer 2011 [facility])    diagnosed in 2009 per pt. Invasive High grade Urothelial carcinoma- s/p radiation and Chemo   . UTI (lower urinary tract infection)     Recurrent  . Malnutrition   . Leukopenia 12/06/2011    HIV serology negative  . Fatty liver     Past Surgical History  Procedure Laterality Date  . Back surgery      X2  . Hernia repair      mesh  . Abdominal surgery      with intestinal "puncture" x 2, exploratory surgery   . Cholecystectomy    . Ileo conduit      For bladder cancer  . Cystectomy      breast  . Portacath placement    . Bladder removal      Family History  Problem Relation Age of Onset  . Heart disease Mother     enlarged  . Hypertension Mother   . Diabetes Father   . Diabetes Sister   . Diabetes Maternal Grandmother   . Colon cancer Neg Hx     Social History:  reports that she quit  smoking about 23 months ago. She does not have any smokeless tobacco history on file. She reports that she does not drink alcohol or use illicit drugs.  Allergies:  Allergies  Allergen Reactions  . Bactrim (Sulfamethoxazole W-Trimethoprim)   . Codeine Hives  . Nitrofuran Derivatives Itching  . Penicillins Hives  . Sulfa Antibiotics Hives    Medications: I have reviewed the patient's current medications.  Results for orders placed during the hospital encounter of 08/25/12 (from the past 48 hour(s))  COMPREHENSIVE METABOLIC PANEL     Status: Abnormal   Collection Time    08/25/12  8:34 PM      Result Value Range   Sodium 135  135 - 145 mEq/L   Potassium 4.0  3.5 - 5.1 mEq/L   Chloride 100  96 - 112 mEq/L   CO2 26  19 - 32 mEq/L   Glucose, Bld 81  70 - 99 mg/dL   BUN 26 (*) 6 - 23 mg/dL   Creatinine, Ser 1.91  0.50 - 1.10 mg/dL   Calcium 9.3  8.4 - 47.8 mg/dL   Total Protein 7.4  6.0 -  8.3 g/dL   Albumin 3.5  3.5 - 5.2 g/dL   AST 17  0 - 37 U/L   ALT 13  0 - 35 U/L   Alkaline Phosphatase 179 (*) 39 - 117 U/L   Total Bilirubin 0.3  0.3 - 1.2 mg/dL   GFR calc non Af Amer 69 (*) >90 mL/min   GFR calc Af Amer 79 (*) >90 mL/min   Comment:            The eGFR has been calculated     using the CKD EPI equation.     This calculation has not been     validated in all clinical     situations.     eGFR's persistently     <90 mL/min signify     possible Chronic Kidney Disease.  CBC     Status: Abnormal   Collection Time    08/25/12  8:34 PM      Result Value Range   WBC 6.2  4.0 - 10.5 K/uL   RBC 4.49  3.87 - 5.11 MIL/uL   Hemoglobin 12.0  12.0 - 15.0 g/dL   HCT 40.9 (*) 81.1 - 91.4 %   MCV 75.1 (*) 78.0 - 100.0 fL   MCH 26.7  26.0 - 34.0 pg   MCHC 35.6  30.0 - 36.0 g/dL   RDW 78.2  95.6 - 21.3 %   Platelets 371  150 - 400 K/uL  LIPASE, BLOOD     Status: None   Collection Time    08/25/12  8:34 PM      Result Value Range   Lipase 12  11 - 59 U/L  URINALYSIS, ROUTINE W  REFLEX MICROSCOPIC     Status: Abnormal   Collection Time    08/25/12 11:20 PM      Result Value Range   Color, Urine YELLOW  YELLOW   APPearance CLOUDY (*) CLEAR   Specific Gravity, Urine 1.015  1.005 - 1.030   pH 8.5 (*) 5.0 - 8.0   Glucose, UA NEGATIVE  NEGATIVE mg/dL   Hgb urine dipstick TRACE (*) NEGATIVE   Bilirubin Urine NEGATIVE  NEGATIVE   Ketones, ur NEGATIVE  NEGATIVE mg/dL   Protein, ur 30 (*) NEGATIVE mg/dL   Urobilinogen, UA 0.2  0.0 - 1.0 mg/dL   Nitrite NEGATIVE  NEGATIVE   Leukocytes, UA LARGE (*) NEGATIVE  URINE MICROSCOPIC-ADD ON     Status: Abnormal   Collection Time    08/25/12 11:20 PM      Result Value Range   WBC, UA 21-50  <3 WBC/hpf   Bacteria, UA MANY (*) RARE   Crystals TRIPLE PHOSPHATE CRYSTALS (*) NEGATIVE  BASIC METABOLIC PANEL     Status: Abnormal   Collection Time    08/26/12  5:18 AM      Result Value Range   Sodium 135  135 - 145 mEq/L   Potassium 4.4  3.5 - 5.1 mEq/L   Chloride 102  96 - 112 mEq/L   CO2 25  19 - 32 mEq/L   Glucose, Bld 87  70 - 99 mg/dL   BUN 23  6 - 23 mg/dL   Creatinine, Ser 0.86  0.50 - 1.10 mg/dL   Calcium 8.8  8.4 - 57.8 mg/dL   GFR calc non Af Amer 61 (*) >90 mL/min   GFR calc Af Amer 71 (*) >90 mL/min   Comment:            The  eGFR has been calculated     using the CKD EPI equation.     This calculation has not been     validated in all clinical     situations.     eGFR's persistently     <90 mL/min signify     possible Chronic Kidney Disease.  CBC     Status: Abnormal   Collection Time    08/26/12  5:18 AM      Result Value Range   WBC 4.8  4.0 - 10.5 K/uL   RBC 4.14  3.87 - 5.11 MIL/uL   Hemoglobin 11.0 (*) 12.0 - 15.0 g/dL   HCT 40.9 (*) 81.1 - 91.4 %   MCV 75.6 (*) 78.0 - 100.0 fL   MCH 26.6  26.0 - 34.0 pg   MCHC 35.1  30.0 - 36.0 g/dL   RDW 78.2 (*) 95.6 - 21.3 %   Platelets 347  150 - 400 K/uL  RETICULOCYTES     Status: None   Collection Time    08/26/12  5:50 AM      Result Value Range    Retic Ct Pct 1.4  0.4 - 3.1 %   RBC. 4.11  3.87 - 5.11 MIL/uL   Retic Count, Manual 57.5  19.0 - 186.0 K/uL    Ct Abdomen Pelvis W Contrast  08/25/2012  *RADIOLOGY REPORT*  Clinical Data: Abdominal pain.  History of recurrent small bowel obstruction due to adhesions.  CT ABDOMEN AND PELVIS WITH CONTRAST  Technique:  Multidetector CT imaging of the abdomen and pelvis was performed following the standard protocol during bolus administration of intravenous contrast.  Contrast: 50mL OMNIPAQUE IOHEXOL 300 MG/ML  SOLN, OMNIPAQUE IOHEXOL 300 MG/ML  SOLN  Comparison: 09/27/2011  Findings: Atelectasis in both lung bases.  There is diffuse intra and extrahepatic bile duct dilatation, similar to previous study.  No obstructing mass lesion or stone is identified.  This may be related postoperative physiology.  No focal liver lesions.  Spleen size is normal.  Pancreas, adrenal glands, kidneys, and retroperitoneal lymph nodes are unremarkable. Calcification of the abdominal aorta without aneurysm.  There is marked distension of small bowel with decompression of the terminal ileum.  Transition zone appears to be in the area of scarring in the anterior abdominal wall at the midline.  This is associated with surgical clips and dystrophic calcification.  The appearance is similar to previous study.  The colon is decompressed and displaced.  There is a right lower quadrant ostomy, likely ileal conduit.  No free air or free fluid is suggested in the abdomen.  Pelvis: Surgical absence of the bladder.  A small amount of free fluid in the pelvis.  Rectosigmoid colon is decompressed.  Appendix appears to be surgically absent.  Degenerative changes in the lumbar spine with normal alignment.  IMPRESSION: Small bowel obstruction with diffuse distension of predominately fluid-filled small bowel loops and decompression of the ileum. Transition zone appears to be at the midline of the lower abdomen and an area of surgical scarring  suggesting adhesions.  Appearance is similar to previous study.   Original Report Authenticated By: Burman Nieves, M.D.     Review of Systems  Respiratory: Positive for cough.    Blood pressure 91/57, pulse 71, temperature 98.1 F (36.7 C), temperature source Oral, resp. rate 18, height 5\' 5"  (1.651 m), weight 163 lb 2.3 oz (74 kg), SpO2 97.00%.  Physical Exam Alert and oriented. Skin warm and dry. Oral mucosa is moist.   .  Sclera anicteric, conjunctivae is pink. Thyroid not enlarged. No cervical lymphadenopathy. Lungs clear. Heart regular rate and rhythm.  Abdomen is soft. Bowel sounds are positive. No hepatomegaly. No abdominal masses felt. No tenderness to abdomen. Urostomy bag in place.  No edema to lower extremities. Patient is alert and oriented.  Assessment/Plan: Small bowel obstruction. Keep patient NPO. Will discuss with Dr. Karilyn Cota.  SETZER,TERRI W 08/26/2012, 8:31 AM   GI attending note; Patient interviewed and examined. Current CT compared with one from March 2013. She is passing flatus. Suspect her process is combination of mechanical obstruction and ileus or pseudoobstruction. She is on TPN for 18 months and likely has not had any problems such as infection or SVC thrombosis. Patient has been evaluated at [facility] as well as [facility]. Since there does not appear to be a remedy for this problem she may want to explore small bowel transplant. With history of bladder cancer she may not be a candidate for this kind of surgery for at least 2 more years. I asked patient's daughter to explore this option. Dr. Jena Gauss will be assisting you with GI care starting on 08/27/2012

## 2012-08-27 DIAGNOSIS — Z8551 Personal history of malignant neoplasm of bladder: Secondary | ICD-10-CM

## 2012-08-27 DIAGNOSIS — R109 Unspecified abdominal pain: Secondary | ICD-10-CM

## 2012-08-27 LAB — MAGNESIUM: Magnesium: 2.2 mg/dL (ref 1.5–2.5)

## 2012-08-27 LAB — CBC
Hemoglobin: 10.5 g/dL — ABNORMAL LOW (ref 12.0–15.0)
MCH: 26.7 pg (ref 26.0–34.0)
MCHC: 35.5 g/dL (ref 30.0–36.0)
MCV: 75.3 fL — ABNORMAL LOW (ref 78.0–100.0)

## 2012-08-27 LAB — COMPREHENSIVE METABOLIC PANEL
ALT: 20 U/L (ref 0–35)
CO2: 23 mEq/L (ref 19–32)
Calcium: 8.4 mg/dL (ref 8.4–10.5)
Creatinine, Ser: 0.89 mg/dL (ref 0.50–1.10)
GFR calc Af Amer: 79 mL/min — ABNORMAL LOW (ref >90)
GFR calc non Af Amer: 69 mL/min — ABNORMAL LOW (ref >90)
Glucose, Bld: 132 mg/dL — ABNORMAL HIGH (ref 70–99)
Sodium: 135 mEq/L (ref 135–145)
Total Protein: 5.8 g/dL — ABNORMAL LOW (ref 6.0–8.3)

## 2012-08-27 LAB — PHOSPHORUS: Phosphorus: 4 mg/dL (ref 2.3–4.6)

## 2012-08-27 MED ORDER — SENNOSIDES-DOCUSATE SODIUM 8.6-50 MG PO TABS
1.0000 | ORAL_TABLET | Freq: Two times a day (BID) | ORAL | Status: DC
  Administered 2012-08-27 – 2012-08-28 (×3): 1 via ORAL
  Filled 2012-08-27 (×3): qty 1

## 2012-08-27 MED ORDER — DOCUSATE SODIUM 100 MG PO CAPS
100.0000 mg | ORAL_CAPSULE | Freq: Two times a day (BID) | ORAL | Status: DC
  Administered 2012-08-27 – 2012-08-28 (×3): 100 mg via ORAL
  Filled 2012-08-27 (×4): qty 1

## 2012-08-27 MED ORDER — STERILE WATER FOR INJECTION IV SOLN
INTRAVENOUS | Status: AC
  Administered 2012-08-27: 22:00:00 via INTRAVENOUS

## 2012-08-27 MED ORDER — POLYETHYLENE GLYCOL 3350 17 G PO PACK
17.0000 g | PACK | Freq: Every day | ORAL | Status: DC
  Administered 2012-08-27 – 2012-08-28 (×2): 17 g via ORAL
  Filled 2012-08-27 (×2): qty 1

## 2012-08-27 NOTE — Progress Notes (Signed)
PARENTERAL NUTRITION CONSULT NOTE  Pharmacy Consult for TPN Indication: SBO  Allergies  Allergen Reactions  . Bactrim (Sulfamethoxazole W-Trimethoprim)   . Codeine Hives  . Nitrofuran Derivatives Itching  . Penicillins Hives  . Sulfa Antibiotics Hives    Patient Measurements: Height: 5\' 5"  (165.1 cm) Weight: 166 lb 3.6 oz (75.4 kg) IBW/kg (Calculated) : 57  Vital Signs: Temp: 98.1 F (36.7 C) (02/27 1029) Temp src: Oral (02/27 1029) BP: 117/73 mmHg (02/27 1029) Pulse Rate: 75 (02/27 1029) Intake/Output from previous day: 02/26 0701 - 02/27 0700 In: -  Out: 1550 [Urine:1550] Intake/Output from this shift:    Labs:  Recent Labs  08/25/12 2034 08/26/12 0518 08/27/12 0600  WBC 6.2 4.8 4.3  HGB 12.0 11.0* 10.5*  HCT 33.7* 31.3* 29.6*  PLT 371 347 331     Recent Labs  08/25/12 2034 08/26/12 0518 08/27/12 0600  NA 135 135 135  K 4.0 4.4 4.7  CL 100 102 105  CO2 26 25 23   GLUCOSE 81 87 132*  BUN 26* 23 25*  CREATININE 0.89 0.98 0.89  CALCIUM 9.3 8.8 8.4  MG  --   --  2.2  PHOS  --   --  4.0  PROT 7.4  --  5.8*  ALBUMIN 3.5  --  2.6*  AST 17  --  23  ALT 13  --  20  ALKPHOS 179*  --  181*  BILITOT 0.3  --  0.5   Estimated Creatinine Clearance: 67.5 ml/min (by C-G formula based on Cr of 0.89).   No results found for this basename: GLUCAP,  in the last 72 hours  Medical History: Past Medical History  Diagnosis Date  . Bowel obstruction several    Recurrent SBO secondary to adhesions  . Hypothyroidism   . Acid reflux   . Chronic back pain   . Hyperlipidemia   . Hypertension     pt says taken off medication since has lost weight.  . Cancer 2011 [facility])    diagnosed in 2009 per pt. Invasive High grade Urothelial carcinoma- s/p radiation and Chemo   . UTI (lower urinary tract infection)     Recurrent  . Malnutrition   . Leukopenia 12/06/2011    HIV serology negative  . Fatty liver     Medications:  Scheduled:  . ceFEPIme (MAXIPIME)  1 GM IVPB  1 g Intravenous Q12H  . enoxaparin (LOVENOX) injection  40 mg Subcutaneous Q24H  . levothyroxine  44 mcg Intravenous Daily  . pantoprazole (PROTONIX) IV  40 mg Intravenous Q24H  . [DISCONTINUED] docusate sodium  100 mg Oral BID  . [DISCONTINUED] NON FORMULARY 2,850 mL  2,850 mL Intravenous (Continuous Infusion) Q24H  . [DISCONTINUED] senna-docusate  1 tablet Oral BID    Insulin Requirements in the past 24 hours:  none  Current Nutrition:  TPN  Assessment: 62 yo F with recurrent small bowel obstructions after cystectomy and ileal conduit for bladder cancer in 2010.  As a result she has been unable to get adequate nutritional intake via GI tract without repeat obstructions & has been receiving TPN since August 2012.  She is [facility] at [facility].    Her electrolytes are normal today.  Her glucose at 0600 today was <150.  She is able to provide explicit information about her home TPN.  She does not use insulin at home.   Since she is well-controlled on her outpatient TPN and this hospital cannot provide the same formulation due to  national backorders will continue to use her home supply.  Nutritional Goals:  25 kCal/kg/day, 0.8 grams/kg of protein per day   Plan:  1) Patient's home TPN formula infused for 12 hours nightly at 237.59ml/hr.  Formula contains electrolytes & trace elements daily.  Multivitamins added on MWF.  Lipids on Monday & Thursday 2) Monitor labs per protocol  [name], [facility] 08/27/2012,11:51 AM

## 2012-08-27 NOTE — Progress Notes (Addendum)
Subjective: Upper abdominal pain, improved but still present. 2 bowel movements this morning. Denies flatus. No N/V. No rectal bleeding. Notes she goes to [facility] every three months but missed the last appointment due to the weather.   Objective: Vital signs in last 24 hours: Temp:  [97.8 F (36.6 C)-98.5 F (36.9 C)] 97.8 F (36.6 C) (02/27 0500) Pulse Rate:  [67-96] 76 (02/27 0500) Resp:  [16-20] 16 (02/27 0500) BP: (95-112)/(42-76) 112/71 mmHg (02/27 0500) SpO2:  [96 %-100 %] 96 % (02/27 0500) Weight:  [166 lb 3.6 oz (75.4 kg)] 166 lb 3.6 oz (75.4 kg) (02/27 0430) Last BM Date: 08/26/12 General:   Alert and oriented, pleasant Head:  Normocephalic and atraumatic. Eyes:  No icterus, sclera clear. Conjuctiva pink.  Mouth:  Without lesions, mucosa pink and moist.   Heart:  S1, S2 present, no murmurs noted.  Lungs: Clear to auscultation bilaterally, without wheezing, rales, or rhonchi.  Abdomen:  Bowel sounds present but sluggish, soft but distended. No HSM or hernias noted. Ostomy with clear yellow urine. Msk:  Symmetrical without gross deformities. Normal posture. Extremities:  Without clubbing or edema. Neurologic:  Alert and  oriented x4;  grossly normal neurologically. Skin:  Warm and dry, intact without significant lesions.  Psych:  Alert and cooperative. Normal mood and affect.  Intake/Output from previous day: 02/26 0701 - 02/27 0700 In: -  Out: 1550 [Urine:1550] Intake/Output this shift:    Lab Results:  Recent Labs  08/25/12 2034 08/26/12 0518 08/27/12 0600  WBC 6.2 4.8 4.3  HGB 12.0 11.0* 10.5*  HCT 33.7* 31.3* 29.6*  PLT 371 347 331   BMET  Recent Labs  08/25/12 2034 08/26/12 0518 08/27/12 0600  NA 135 135 135  K 4.0 4.4 4.7  CL 100 102 105  CO2 26 25 23   GLUCOSE 81 87 132*  BUN 26* 23 25*  CREATININE 0.89 0.98 0.89  CALCIUM 9.3 8.8 8.4   LFT  Recent Labs  08/25/12 2034 08/27/12 0600  PROT 7.4 5.8*  ALBUMIN 3.5 2.6*  AST 17 23  ALT 13 20   ALKPHOS 179* 181*  BILITOT 0.3 0.5     Studies/Results: Ct Abdomen Pelvis W Contrast  08/25/2012  *RADIOLOGY REPORT*  Clinical Data: Abdominal pain.  History of recurrent small bowel obstruction due to adhesions.  CT ABDOMEN AND PELVIS WITH CONTRAST  Technique:  Multidetector CT imaging of the abdomen and pelvis was performed following the standard protocol during bolus administration of intravenous contrast.  Contrast: 50mL OMNIPAQUE IOHEXOL 300 MG/ML  SOLN, OMNIPAQUE IOHEXOL 300 MG/ML  SOLN  Comparison: 09/27/2011  Findings: Atelectasis in both lung bases.  There is diffuse intra and extrahepatic bile duct dilatation, similar to previous study.  No obstructing mass lesion or stone is identified.  This may be related postoperative physiology.  No focal liver lesions.  Spleen size is normal.  Pancreas, adrenal glands, kidneys, and retroperitoneal lymph nodes are unremarkable. Calcification of the abdominal aorta without aneurysm.  There is marked distension of small bowel with decompression of the terminal ileum.  Transition zone appears to be in the area of scarring in the anterior abdominal wall at the midline.  This is associated with surgical clips and dystrophic calcification.  The appearance is similar to previous study.  The colon is decompressed and displaced.  There is a right lower quadrant ostomy, likely ileal conduit.  No free air or free fluid is suggested in the abdomen.  Pelvis: Surgical absence of the bladder.  A small amount of free fluid in the pelvis.  Rectosigmoid colon is decompressed.  Appendix appears to be surgically absent.  Degenerative changes in the lumbar spine with normal alignment.  IMPRESSION: Small bowel obstruction with diffuse distension of predominately fluid-filled small bowel loops and decompression of the ileum. Transition zone appears to be at the midline of the lower abdomen and an area of surgical scarring suggesting adhesions.  Appearance is similar to  previous study.   Original Report Authenticated By: Burman Nieves, M.D.     Assessment: 62 year old female with a history of recurrent small bowel obstructions secondary to adhesive disease, followed by [facility]. Maintained on TPN, not a surgical candidate due to significant adhesions. Admitted again with a small bowel obstruction, transition zone noted at area of prior surgical scarring, similar to past studies. She appears to be clinically improving without N/V and improving abdominal discomfort. Denies flatus currently, but she has had 2 bowel movements this morning,  Microcytic anemia is chronic, with anemia panel showing low iron at 41, ferritin normal at 62. Likely a combination of chronic disease and possible IDA. Her Hgb has remained overall at baseline for her. No signs of rectal bleeding. She does note a prior colonoscopy in the remote past; our office had actually discussed an updated colonoscopy in Nov; however, she declined and requested a virtual colonoscopy. This has not been done yet due to intermittent admissions.   Plan: Continue TPN per pharmacy Continue supportive measures Consider clear liquids later today Outpatient follow-up at [facility] Consider outpatient virtual colonoscopy as previously planned once improved from this admission. May be difficult with her current health status, chronicity/recurrence of bowel obstructions.    LOS: 2 days   Gerrit Halls  08/27/2012, 8:07 AM  Feels she is improving today. She has been advanced to a clear liquid diet and tolerating this fairly well this afternoon. 2 bowel movements earlier today. She has good bowel sounds and a little less abdominal distention this afternoon.  She is scheduled scheduled to see the folks at [facility] on March 19. She recalls that she felt she was doing very well over the last several weeks and subsequently consented a large amount of tacos 3 days in a row immediately preceding this acute presentation.  I would like to see  her slowly advanced to a low residue gastroparesis/bariatric-like diet prior to discharge. It may be beneficial to have the nutritionist come by prior to discharge.

## 2012-08-27 NOTE — Progress Notes (Signed)
[facility] PROGRESS NOTE  [name] WUJ:[id number] DOB: 1950-08-08 DOA: 08/25/2012 PCP: Milinda Antis, MD  Assessment/Plan: SBO (small bowel obstruction): hx of same since cystectomy and ileal conduit for bladder cancer. Presumably due to adhesions. Pain improved this am reports not needing pain medicine but once yesterday. Appreciate GI assistance. 2 small BMs this am. She feels that gas is passing into her ostomy bag. Denies nausea. Will try the patient on clear liquids today.  Restart her regular bowel regimen.    Active Problems:  HYPOTHYROIDISM: TSH 9.3. May be somewhat due to acute illness but level elevated 3 months ago. Will continue current dose IV synthroid. Her thyroid medication is being adjusted by her primary doctor.  Will defer this for outpatient follow up.   Recurrent UTI: culture  Culture yields multiple bacteria non predominant. Continue cefepime day #3. Afebrile. WC WNL. Non-toxic appearing.   Microcytic anemia: likely combination IDA and chronic disease. Anemia panel yields iron 41. Chart review indicates current level at baseline. No s/sx active bleeding.  HYPERTENSION: controlled.  SBP range 95-112. Not on anti-hypertensive at home   Hx of bladder cancer: s/p cystectomy and ileal conduit. Stable   On total parenteral nutrition (TPN): for last 18 months. Per pharmacy     Code Status: full Family Communication: daughter at bedside Disposition Plan: home when ready   Consultants:  GI  Procedures:  none  Antibiotics:  Cefipime 08/25/12>>>  HPI/Subjective: Lying in bed alert. Reports pain is less. Interested in clear liquids today. Moving bowels  Objective: Filed Vitals:   08/26/12 1749 08/26/12 2300 08/27/12 0430 08/27/12 0500  BP: 110/76 110/66  112/71  Pulse:  96  76  Temp:  98.5 F (36.9 C)  97.8 F (36.6 C)  TempSrc:  Oral  Oral  Resp:  20  16  Height:      Weight:   75.4 kg (166 lb 3.6 oz)   SpO2: 100% 97%  96%     Intake/Output Summary (Last 24 hours) at 08/27/12 0913 Last data filed at 08/27/12 0500  Gross per 24 hour  Intake      0 ml  Output   1150 ml  Net  -1150 ml   Filed Weights   08/26/12 0218 08/26/12 0534 08/27/12 0430  Weight: 74 kg (163 lb 2.3 oz) 74 kg (163 lb 2.3 oz) 75.4 kg (166 lb 3.6 oz)    Exam:   General:  Alert oriented NAD  Cardiovascular: RRR No MGR No LEE  Respiratory: normal effort BSCTAB  No wheeze. No rhonchi  Abdomen: distended but soft. Very sluggish BS. Non-tender. Ostomy with pink stoma, draining clear yellow urine  Data Reviewed: Basic Metabolic Panel:  Recent Labs Lab 08/25/12 2034 08/26/12 0518 08/27/12 0600  NA 135 135 135  K 4.0 4.4 4.7  CL 100 102 105  CO2 26 25 23   GLUCOSE 81 87 132*  BUN 26* 23 25*  CREATININE 0.89 0.98 0.89  CALCIUM 9.3 8.8 8.4  MG  --   --  2.2  PHOS  --   --  4.0   Liver Function Tests:  Recent Labs Lab 08/25/12 2034 08/27/12 0600  AST 17 23  ALT 13 20  ALKPHOS 179* 181*  BILITOT 0.3 0.5  PROT 7.4 5.8*  ALBUMIN 3.5 2.6*    Recent Labs Lab 08/25/12 2034  LIPASE 12   No results found for this basename: AMMONIA,  in the last 168 hours CBC:  Recent Labs Lab 08/25/12 2034 08/26/12  0518 08/27/12 0600  WBC 6.2 4.8 4.3  HGB 12.0 11.0* 10.5*  HCT 33.7* 31.3* 29.6*  MCV 75.1* 75.6* 75.3*  PLT 371 347 331   Cardiac Enzymes: No results found for this basename: CKTOTAL, CKMB, CKMBINDEX, TROPONINI,  in the last 168 hours BNP (last 3 results) No results found for this basename: PROBNP,  in the last 8760 hours CBG: No results found for this basename: GLUCAP,  in the last 168 hours  Recent Results (from the past 240 hour(s))  URINE CULTURE     Status: None   Collection Time    08/25/12 11:20 PM      Result Value Range Status   Specimen Description URINE, CLEAN CATCH   Final   Special Requests NONE   Final   Culture  Setup Time 08/26/2012 15:34   Final   Colony Count 80,000 COLONIES/ML   Final    Culture     Final   Value: Multiple bacterial morphotypes present, none predominant. Suggest appropriate recollection if clinically indicated.   Report Status 08/26/2012 FINAL   Final     Studies: Ct Abdomen Pelvis W Contrast  08/25/2012  *RADIOLOGY REPORT*  Clinical Data: Abdominal pain.  History of recurrent small bowel obstruction due to adhesions.  CT ABDOMEN AND PELVIS WITH CONTRAST  Technique:  Multidetector CT imaging of the abdomen and pelvis was performed following the standard protocol during bolus administration of intravenous contrast.  Contrast: 50mL OMNIPAQUE IOHEXOL 300 MG/ML  SOLN, OMNIPAQUE IOHEXOL 300 MG/ML  SOLN  Comparison: 09/27/2011  Findings: Atelectasis in both lung bases.  There is diffuse intra and extrahepatic bile duct dilatation, similar to previous study.  No obstructing mass lesion or stone is identified.  This may be related postoperative physiology.  No focal liver lesions.  Spleen size is normal.  Pancreas, adrenal glands, kidneys, and retroperitoneal lymph nodes are unremarkable. Calcification of the abdominal aorta without aneurysm.  There is marked distension of small bowel with decompression of the terminal ileum.  Transition zone appears to be in the area of scarring in the anterior abdominal wall at the midline.  This is associated with surgical clips and dystrophic calcification.  The appearance is similar to previous study.  The colon is decompressed and displaced.  There is a right lower quadrant ostomy, likely ileal conduit.  No free air or free fluid is suggested in the abdomen.  Pelvis: Surgical absence of the bladder.  A small amount of free fluid in the pelvis.  Rectosigmoid colon is decompressed.  Appendix appears to be surgically absent.  Degenerative changes in the lumbar spine with normal alignment.  IMPRESSION: Small bowel obstruction with diffuse distension of predominately fluid-filled small bowel loops and decompression of the ileum. Transition  zone appears to be at the midline of the lower abdomen and an area of surgical scarring suggesting adhesions.  Appearance is similar to previous study.   Original Report Authenticated By: Burman Nieves, M.D.     Scheduled Meds: . ceFEPIme (MAXIPIME) 1 GM IVPB  1 g Intravenous Q12H  . enoxaparin (LOVENOX) injection  40 mg Subcutaneous Q24H  . levothyroxine  44 mcg Intravenous Daily  . pantoprazole (PROTONIX) IV  40 mg Intravenous Q24H   Continuous Infusions: . 0.9 % NaCl with KCl 20 mEq / L 150 mL/hr at 08/26/12 1836    Principal Problem:   SBO (small bowel obstruction) Active Problems:   HYPOTHYROIDISM   HYPERLIPIDEMIA   HYPERTENSION   Hx of bladder cancer  On total parenteral nutrition (TPN)   Recurrent UTI   Microcytic anemia    Time spent: 35 minutes    [facility] M  [facility]  If 8PM-8AM, please contact night-coverage at [email or web address], password [facility] [place] 08/27/2012, 9:13 AM  LOS: 2 days    Attending note:    Patient seen and examined.  Agree with note as above.

## 2012-08-27 NOTE — Progress Notes (Signed)
INITIAL NUTRITION ASSESSMENT  DOCUMENTATION CODES Per approved criteria  -Not Applicable   INTERVENTION: TPN per pharmacy  NUTRITION DIAGNOSIS: Inadequate oral intake related to  inability to eat as evidenced by NPO status.   Goal: Pt to meet >/= 90% of their estimated nutrition needs  Monitor:  Nutrition status reassess needs as indicated  Reason for Assessment: Home TPN  62 y.o. female  Admitting Dx: SBO (small bowel obstruction)  ASSESSMENT: Pt has hx of bladder cancer and SBO. She is s/p cystectomy and ileal conduit due to bladder CA. No NGT currently, pt has refused.   Home TPN schedule is 2100-0930 for past 18 months. Talked with pharmacy and current plan is to continue with pt home TPN regimen.  Pt currently NPO. GI follow-up noted to consider advancing diet to clears later today. No recent hx of significant wt loss.  Discussed usual intake with pt. She eats applesauce, mash potatoes, baby foods and [place] and chicken. She is agreeable to [employer] when diet is advanced. Her po fluid intake is very poor. Encouraged her to increase oral fluid intake.   Height: Ht Readings from Last 1 Encounters:  08/26/12 5\' 5"  (1.651 m)    Weight: Wt Readings from Last 1 Encounters:  08/27/12 166 lb 3.6 oz (75.4 kg)    Ideal Body Weight: 125# (56.8 kg)  % Ideal Body Weight: 133%  Wt Readings from Last 10 Encounters:  08/27/12 166 lb 3.6 oz (75.4 kg)  07/21/12 160 lb (72.576 kg)  05/26/12 163 lb 9.6 oz (74.208 kg)  05/21/12 160 lb 1.9 oz (72.63 kg)  05/01/12 164 lb 6.4 oz (74.571 kg)  03/27/12 158 lb (71.668 kg)  02/20/12 160 lb (72.576 kg)  02/18/12 159 lb 1.9 oz (72.176 kg)  02/07/12 158 lb 12.8 oz (72.031 kg)  12/19/11 159 lb 1.3 oz (72.158 kg)    Usual Body Weight: 160# (72.7 kg)  % Usual Body Weight: 104%  BMI:  Body mass index is 27.66 kg/(m^2). Overweight  Estimated Nutritional Needs: Kcal: 1425-1710 Protein: 68-75 gr Fluid: >1700 ml/day  Skin:  no issues noted  Diet Order:  NPO-Home diet TPN  EDUCATION NEEDS: -No education needs identified at this time   Intake/Output Summary (Last 24 hours) at 08/27/12 0943 Last data filed at 08/27/12 0500  Gross per 24 hour  Intake      0 ml  Output   1150 ml  Net  -1150 ml    Last BM: 08/26/12 abdomen distended, nontender  Labs:   Recent Labs Lab 08/25/12 2034 08/26/12 0518 08/27/12 0600  NA 135 135 135  K 4.0 4.4 4.7  CL 100 102 105  CO2 26 25 23   BUN 26* 23 25*  CREATININE 0.89 0.98 0.89  CALCIUM 9.3 8.8 8.4  MG  --   --  2.2  PHOS  --   --  4.0  GLUCOSE 81 87 132*    CBG (last 3)  No results found for this basename: GLUCAP,  in the last 72 hours  Scheduled Meds: . ceFEPIme (MAXIPIME) 1 GM IVPB  1 g Intravenous Q12H  . enoxaparin (LOVENOX) injection  40 mg Subcutaneous Q24H  . levothyroxine  44 mcg Intravenous Daily  . pantoprazole (PROTONIX) IV  40 mg Intravenous Q24H    Continuous Infusions: . 0.9 % NaCl with KCl 20 mEq / L 100 mL/hr at 08/27/12 0930    Past Medical History  Diagnosis Date  . Bowel obstruction several    Recurrent SBO  secondary to adhesions  . Hypothyroidism   . Acid reflux   . Chronic back pain   . Hyperlipidemia   . Hypertension     pt says taken off medication since has lost weight.  . Cancer 2011 [facility])    diagnosed in 2009 per pt. Invasive High grade Urothelial carcinoma- s/p radiation and Chemo   . UTI (lower urinary tract infection)     Recurrent  . Malnutrition   . Leukopenia 12/06/2011    HIV serology negative  . Fatty liver     Past Surgical History  Procedure Laterality Date  . Back surgery      X2  . Hernia repair      mesh  . Abdominal surgery      with intestinal "puncture" x 2, exploratory surgery   . Cholecystectomy    . Ileo conduit      For bladder cancer  . Cystectomy      breast  . Portacath placement    . Bladder removal      Royann Shivers MS,RD,LDN,CSG Office: #[phone] Pager:  [phone]

## 2012-08-28 ENCOUNTER — Inpatient Hospital Stay (HOSPITAL_COMMUNITY): Payer: Medicare Other

## 2012-08-28 DIAGNOSIS — K56609 Unspecified intestinal obstruction, unspecified as to partial versus complete obstruction: Secondary | ICD-10-CM

## 2012-08-28 DIAGNOSIS — E871 Hypo-osmolality and hyponatremia: Secondary | ICD-10-CM

## 2012-08-28 LAB — CBC
MCV: 75.2 fL — ABNORMAL LOW (ref 78.0–100.0)
Platelets: 353 10*3/uL (ref 150–400)
RBC: 4.4 MIL/uL (ref 3.87–5.11)
RDW: 15.5 % (ref 11.5–15.5)
WBC: 5.3 10*3/uL (ref 4.0–10.5)

## 2012-08-28 LAB — BASIC METABOLIC PANEL
CO2: 22 mEq/L (ref 19–32)
Chloride: 103 mEq/L (ref 96–112)
Creatinine, Ser: 0.97 mg/dL (ref 0.50–1.10)
GFR calc Af Amer: 72 mL/min — ABNORMAL LOW (ref >90)
Potassium: 4.5 mEq/L (ref 3.5–5.1)
Sodium: 134 mEq/L — ABNORMAL LOW (ref 135–145)

## 2012-08-28 LAB — MRSA PCR SCREENING: MRSA by PCR: NEGATIVE

## 2012-08-28 MED ORDER — STERILE WATER FOR INJECTION IV SOLN
INTRAVENOUS | Status: AC
  Administered 2012-08-28: 22:00:00 via INTRAVENOUS

## 2012-08-28 NOTE — Progress Notes (Signed)
PARENTERAL NUTRITION CONSULT NOTE  Pharmacy Consult for TPN Indication: SBO  Allergies  Allergen Reactions  . Bactrim (Sulfamethoxazole W-Trimethoprim)   . Codeine Hives  . Nitrofuran Derivatives Itching  . Penicillins Hives  . Sulfa Antibiotics Hives   Patient Measurements: Height: 5\' 5"  (165.1 cm) Weight: 170 lb 6.7 oz (77.3 kg) IBW/kg (Calculated) : 57  Vital Signs: Temp: 99.7 F (37.6 C) (02/28 0507) Temp src: Oral (02/28 0507) BP: 120/77 mmHg (02/28 0507) Pulse Rate: 92 (02/28 0507) Intake/Output from previous day: 02/27 0701 - 02/28 0700 In: 270 [P.O.:270] Out: 2500 [Urine:2500] Intake/Output from this shift:    Labs:  Recent Labs  08/26/12 0518 08/27/12 0600 08/28/12 0521  WBC 4.8 4.3 5.3  HGB 11.0* 10.5* 11.6*  HCT 31.3* 29.6* 33.1*  PLT 347 331 353     Recent Labs  08/25/12 2034 08/26/12 0518 08/27/12 0600 08/28/12 0521  NA 135 135 135 134*  K 4.0 4.4 4.7 4.5  CL 100 102 105 103  CO2 26 25 23 22   GLUCOSE 81 87 132* 131*  BUN 26* 23 25* 19  CREATININE 0.89 0.98 0.89 0.97  CALCIUM 9.3 8.8 8.4 8.8  MG  --   --  2.2  --   PHOS  --   --  4.0  --   PROT 7.4  --  5.8*  --   ALBUMIN 3.5  --  2.6*  --   AST 17  --  23  --   ALT 13  --  20  --   ALKPHOS 179*  --  181*  --   BILITOT 0.3  --  0.5  --    Estimated Creatinine Clearance: 62.6 ml/min (by C-G formula based on Cr of 0.97).   No results found for this basename: GLUCAP,  in the last 72 hours  Medical History: Past Medical History  Diagnosis Date  . Bowel obstruction several    Recurrent SBO secondary to adhesions  . Hypothyroidism   . Acid reflux   . Chronic back pain   . Hyperlipidemia   . Hypertension     pt says taken off medication since has lost weight.  . Cancer 2011 [facility])    diagnosed in 2009 per pt. Invasive High grade Urothelial carcinoma- s/p radiation and Chemo   . UTI (lower urinary tract infection)     Recurrent  . Malnutrition   . Leukopenia 12/06/2011     HIV serology negative  . Fatty liver    Medications:  Scheduled:  . ceFEPIme (MAXIPIME) 1 GM IVPB  1 g Intravenous Q12H  . docusate sodium  100 mg Oral BID  . enoxaparin (LOVENOX) injection  40 mg Subcutaneous Q24H  . levothyroxine  44 mcg Intravenous Daily  . pantoprazole (PROTONIX) IV  40 mg Intravenous Q24H  . polyethylene glycol  17 g Oral Daily  . senna-docusate  1 tablet Oral BID   Insulin Requirements in the past 24 hours:  none  Current Nutrition:  TPN  Assessment: 62 yo F with recurrent small bowel obstructions after cystectomy and ileal conduit for bladder cancer in 2010.  As a result she has been unable to get adequate nutritional intake via GI tract without repeat obstructions & has been receiving TPN since August 2012.  She is followed at [facility].    Her electrolytes are normal today.  Her glucose at 0600 today was <150.  She is able to provide explicit information about her home TPN.  She does not  use insulin at home.   Since she is well-controlled on her outpatient TPN and this hospital cannot provide the same formulation due to national backorders will continue to use her home supply.  Nutritional Goals:  25 kCal/kg/day, 0.8 grams/kg of protein per day   Plan:  1) Patient's home TPN formula infused for 12 hours nightly at 237.42ml/hr.  Formula contains electrolytes & trace elements daily.  Multivitamins added on MWF.  Lipids on Monday & Thursday 2) Monitor labs per protocol  Valrie Hart A 08/28/2012,10:39 AM

## 2012-08-28 NOTE — Progress Notes (Signed)
[facility] PROGRESS NOTE  [name] ZOX:[id number] DOB: 1950-10-06 DOA: 08/25/2012 PCP: Milinda Antis, MD  Assessment/Plan: SBO (small bowel obstruction): hx of same since cystectomy and ileal conduit for bladder cancer. Presumably due to adhesions. Pain worsened today after clear liquids yesterday. 4 small BMs yesterday. Denies nausea. Max temp 99.7. WC within normal limits.  Appreciate GI assistance and recommendation. Will make NPO. Acute abdominal series pending.   Active Problems:   Hyponatremia: mild. Likely related to frequent stool. Continue IV fluids and monitor. Checking cdiff as well.   HYPOTHYROIDISM: TSH 9.3.  Will continue current dose IV synthroid. Her thyroid medication is being adjusted by her primary doctor. Will defer this for outpatient follow up.   Recurrent UTI: culture Culture yields multiple bacteria non predominant. Continue cefepime day #4.  WC WNL. Non-toxic appearing.   Microcytic anemia: likely combination IDA and chronic disease. Anemia panel yields iron 41. Chart review indicates current level at baseline. No s/sx active bleeding.   HYPERTENSION: controlled. SBP range 103-120. Not on anti-hypertensive at home   Hx of bladder cancer: s/p cystectomy and ileal conduit. Stable   On total parenteral nutrition (TPN): for last 18 months. Per pharmacy      Code Status: full Family Communication: daughter at bedside Disposition Plan: home when ready.    Consultants:  GI  Procedures:  none  Antibiotics:  Cefipime 08/25/12>>>  HPI/Subjective: Awake teary, uncomfortable appearing. Complains worsening abdominal pain.   Objective: Filed Vitals:   08/27/12 1420 08/27/12 2134 08/28/12 0208 08/28/12 0507  BP: 120/64 106/68 103/61 120/77  Pulse: 77 62 83 92  Temp: 98.2 F (36.8 C) 98.1 F (36.7 C) 98.6 F (37 C) 99.7 F (37.6 C)  TempSrc: Oral Oral Oral Oral  Resp: 18 18 18 18   Height:      Weight:    77.3 kg (170 lb 6.7 oz)   SpO2: 98% 96% 99% 94%    Intake/Output Summary (Last 24 hours) at 08/28/12 1101 Last data filed at 08/28/12 0800  Gross per 24 hour  Intake    270 ml  Output   2500 ml  Net  -2230 ml   Filed Weights   08/26/12 0534 08/27/12 0430 08/28/12 0507  Weight: 74 kg (163 lb 2.3 oz) 75.4 kg (166 lb 3.6 oz) 77.3 kg (170 lb 6.7 oz)    Exam:   General:  Awake, alert uncomfortable appearing  Cardiovascular: RRR No MGR No LEE  Respiratory: normal effort BSCTAB No rhonchi no wheeze  Abdomen: distended, soft very faint BS. Mild tenderness throughout to palpation. No mass noted. Ostomy with pink stoma with clear urine.   Data Reviewed: Basic Metabolic Panel:  Recent Labs Lab 08/25/12 2034 08/26/12 0518 08/27/12 0600 08/28/12 0521  NA 135 135 135 134*  K 4.0 4.4 4.7 4.5  CL 100 102 105 103  CO2 26 25 23 22   GLUCOSE 81 87 132* 131*  BUN 26* 23 25* 19  CREATININE 0.89 0.98 0.89 0.97  CALCIUM 9.3 8.8 8.4 8.8  MG  --   --  2.2  --   PHOS  --   --  4.0  --    Liver Function Tests:  Recent Labs Lab 08/25/12 2034 08/27/12 0600  AST 17 23  ALT 13 20  ALKPHOS 179* 181*  BILITOT 0.3 0.5  PROT 7.4 5.8*  ALBUMIN 3.5 2.6*    Recent Labs Lab 08/25/12 2034  LIPASE 12   No results found for this basename: AMMONIA,  in the last 168 hours CBC:  Recent Labs Lab 08/25/12 2034 08/26/12 0518 08/27/12 0600 08/28/12 0521  WBC 6.2 4.8 4.3 5.3  HGB 12.0 11.0* 10.5* 11.6*  HCT 33.7* 31.3* 29.6* 33.1*  MCV 75.1* 75.6* 75.3* 75.2*  PLT 371 347 331 353   Cardiac Enzymes: No results found for this basename: CKTOTAL, CKMB, CKMBINDEX, TROPONINI,  in the last 168 hours BNP (last 3 results) No results found for this basename: PROBNP,  in the last 8760 hours CBG: No results found for this basename: GLUCAP,  in the last 168 hours  Recent Results (from the past 240 hour(s))  URINE CULTURE     Status: None   Collection Time    08/25/12 11:20 PM      Result Value Range Status    Specimen Description URINE, CLEAN CATCH   Final   Special Requests NONE   Final   Culture  Setup Time 08/26/2012 15:34   Final   Colony Count 80,000 COLONIES/ML   Final   Culture     Final   Value: Multiple bacterial morphotypes present, none predominant. Suggest appropriate recollection if clinically indicated.   Report Status 08/26/2012 FINAL   Final     Studies: No results found.  Scheduled Meds: . ceFEPIme (MAXIPIME) 1 GM IVPB  1 g Intravenous Q12H  . enoxaparin (LOVENOX) injection  40 mg Subcutaneous Q24H  . levothyroxine  44 mcg Intravenous Daily  . pantoprazole (PROTONIX) IV  40 mg Intravenous Q24H   Continuous Infusions: . 0.9 % NaCl with KCl 20 mEq / L 100 mL/hr at 08/28/12 1610    Principal Problem:   SBO (small bowel obstruction) Active Problems:   HYPOTHYROIDISM   HYPERLIPIDEMIA   HYPERTENSION   Hx of bladder cancer   On total parenteral nutrition (TPN)   Recurrent UTI   Microcytic anemia   Hyponatremia    Time spent: 35 minutes    [facility] M  [facility]  If 8PM-8AM, please contact night-coverage at [email or web address], password [facility] 08/28/2012, 11:01 AM  LOS: 3 days   Attending note:  Patient seen and examined.  Agree with note as above.  Patient will be placed with an NG tube today, which will hopefully help relieve her obstruction.  Appreciate GI assistance.

## 2012-08-28 NOTE — Progress Notes (Signed)
Subjective: Pain 6/10 entire abdomen.  Pain meds not working.  3 BMs last night, 1 this AM.  Denies rectal bleeding or melena. +mucus in stools.  States pain is never this bad & usually always resolves with pain meds.  Objective: Vital signs in last 24 hours: Temp:  [98.1 F (36.7 C)-99.7 F (37.6 C)] 99.7 F (37.6 C) (02/28 0507) Pulse Rate:  [62-92] 92 (02/28 0507) Resp:  [18] 18 (02/28 0507) BP: (103-120)/(61-77) 120/77 mmHg (02/28 0507) SpO2:  [94 %-99 %] 94 % (02/28 0507) Weight:  [170 lb 6.7 oz (77.3 kg)] 170 lb 6.7 oz (77.3 kg) (02/28 0507) Last BM Date: 08/27/12 No LMP recorded. Patient is postmenopausal. Body mass index is 28.36 kg/(m^2). General:   Alert,  Well-developed, pleasant and cooperative.  Pt diaphoretic, appears uncomfortable.  Daughter at bedside. Eyes:  Sclera clear, no icterus.   Conjunctiva pink. Mouth:  oropharynx pink & moist. Heart:  Regular rate and rhythm Abdomen:   +faint bowel sounds.  Mildly distended.  +TTP entire abdomen.  +beefy red stoma with clear urine in foley. + guarding & rebound tenderness.   Msk:  Symmetrical. Pulses:  Normal pulses noted. Extremities:  Without edema. Neurologic:  Alert and  oriented x4;  grossly normal neurologically. Skin: Diaphoretic, no rash or jaundice.   Cervical Nodes:  No significant cervical adenopathy. Psych:  Alert and cooperative.  +tearful.  Intake/Output from previous day: 02/27 0701 - 02/28 0700 In: 270 [P.O.:270] Out: 2500 [Urine:2500]  Lab Results:  Recent Labs  08/26/12 0518 08/27/12 0600 08/28/12 0521  WBC 4.8 4.3 5.3  HGB 11.0* 10.5* 11.6*  HCT 31.3* 29.6* 33.1*  PLT 347 331 353   BMET  Recent Labs  08/26/12 0518 08/27/12 0600 08/28/12 0521  NA 135 135 134*  K 4.4 4.7 4.5  CL 102 105 103  CO2 25 23 22   GLUCOSE 87 132* 131*  BUN 23 25* 19  CREATININE 0.98 0.89 0.97  CALCIUM 8.8 8.4 8.8   LFT  Recent Labs  08/25/12 2034 08/27/12 0600  PROT 7.4 5.8*  ALBUMIN 3.5 2.6*   AST 17 23  ALT 13 20  ALKPHOS 179* 181*  BILITOT 0.3 0.5  LIPASE 12  --   Assessment: 1. Recurrent SBO secondary to adhesive disease:  Pain worse than yesterday or any other episode, low grade fever.  Will need further evaluation.  Followed by [facility] (next appt 3/19) 2. Nutrition:  Failed clear liquids today.  Maintained on TPN 3. Chronic microcytic anemia/anemia of chronic disease:  Stable 4. Diarrhea:  Pt has chronic loose stools,  but given change in course, check c diff.  Plan:  1. Continue TPN per pharmacy 2. AAS STAT  3. NPO 4. Continue supportive measures  5. Follow up [facility] 3/19 6. Consider outpatient virtual colonoscopy as previously planned 7. Check c diff PCR    LOS: 3 days   Lorenza Burton  08/28/2012, 8:10 AM   10:33 AM  Discussed w/Dr Jena Gauss w/ plan as above.  May need surgical consult & NG tube pending AAS.  Lorenza Burton  Patient seen and examined at 14:00.  Acute abdominal series reviewed with Dr. Jena Gauss. Patient has a significant partial small bowel obstruction but contrast from prior CT has made it well into the colon; no free air (degree of obstruction about the same as that seen on prior CT scan). NG tube being placed. Surgical consultation at this facility probably will not add much. She is not a surgical candidate locally.  However, if partial SBO does not resolve in the next 24-48 hours, would reach out to [facility] for consideration of transfer down there.

## 2012-08-29 DIAGNOSIS — K56609 Unspecified intestinal obstruction, unspecified as to partial versus complete obstruction: Secondary | ICD-10-CM

## 2012-08-29 LAB — CBC
Hemoglobin: 11.1 g/dL — ABNORMAL LOW (ref 12.0–15.0)
MCH: 26.6 pg (ref 26.0–34.0)
Platelets: 334 10*3/uL (ref 150–400)
RBC: 4.18 MIL/uL (ref 3.87–5.11)
WBC: 3.9 10*3/uL — ABNORMAL LOW (ref 4.0–10.5)

## 2012-08-29 LAB — BASIC METABOLIC PANEL
CO2: 23 mEq/L (ref 19–32)
Calcium: 8.7 mg/dL (ref 8.4–10.5)
GFR calc non Af Amer: 67 mL/min — ABNORMAL LOW (ref >90)
Glucose, Bld: 118 mg/dL — ABNORMAL HIGH (ref 70–99)
Potassium: 4.5 mEq/L (ref 3.5–5.1)
Sodium: 134 mEq/L — ABNORMAL LOW (ref 135–145)

## 2012-08-29 MED ORDER — CLINIMIX E/DEXTROSE (5/15) 5 % IV SOLN
INTRAVENOUS | Status: AC
  Administered 2012-08-29: 21:00:00 via INTRAVENOUS
  Filled 2012-08-29 (×2): qty 2000

## 2012-08-29 NOTE — Progress Notes (Signed)
PARENTERAL NUTRITION CONSULT NOTE  Pharmacy Consult for TPN Indication: SBO  Allergies  Allergen Reactions  . Bactrim (Sulfamethoxazole W-Trimethoprim)   . Codeine Hives  . Nitrofuran Derivatives Itching  . Penicillins Hives  . Sulfa Antibiotics Hives   Patient Measurements: Height: 5\' 5"  (165.1 cm) Weight: 171 lb 8.3 oz (77.8 kg) IBW/kg (Calculated) : 57  Vital Signs: Temp: 98 F (36.7 C) (03/01 0322) Temp src: Oral (03/01 0322) BP: 116/62 mmHg (03/01 0322) Pulse Rate: 81 (03/01 0322) Intake/Output from previous day: 02/28 0701 - 03/01 0700 In: 11307.1 [P.O.:270; I.V.:8857.5; NG/GT:80; IV Piggyback:350; TPN:1749.6] Out: 2100 [Urine:1825; Emesis/NG output:275] Intake/Output from this shift:    Labs:  Recent Labs  08/27/12 0600 08/28/12 0521 08/29/12 0556  WBC 4.3 5.3 3.9*  HGB 10.5* 11.6* 11.1*  HCT 29.6* 33.1* 31.5*  PLT 331 353 334    Recent Labs  08/27/12 0600 08/28/12 0521 08/29/12 0556  NA 135 134* 134*  K 4.7 4.5 4.5  CL 105 103 103  CO2 23 22 23   GLUCOSE 132* 131* 118*  BUN 25* 19 17  CREATININE 0.89 0.97 0.91  CALCIUM 8.4 8.8 8.7  MG 2.2  --   --   PHOS 4.0  --   --   PROT 5.8*  --   --   ALBUMIN 2.6*  --   --   AST 23  --   --   ALT 20  --   --   ALKPHOS 181*  --   --   BILITOT 0.5  --   --    Estimated Creatinine Clearance: 66.9 ml/min (by C-G formula based on Cr of 0.91).   No results found for this basename: GLUCAP,  in the last 72 hours  Medical History: Past Medical History  Diagnosis Date  . Bowel obstruction several    Recurrent SBO secondary to adhesions  . Hypothyroidism   . Acid reflux   . Chronic back pain   . Hyperlipidemia   . Hypertension     pt says taken off medication since has lost weight.  . Cancer 2011 [facility])    diagnosed in 2009 per pt. Invasive High grade Urothelial carcinoma- s/p radiation and Chemo   . UTI (lower urinary tract infection)     Recurrent  . Malnutrition   . Leukopenia 12/06/2011     HIV serology negative  . Fatty liver    Medications:  Scheduled:  . ceFEPIme (MAXIPIME) 1 GM IVPB  1 g Intravenous Q12H  . enoxaparin (LOVENOX) injection  40 mg Subcutaneous Q24H  . levothyroxine  44 mcg Intravenous Daily  . pantoprazole (PROTONIX) IV  40 mg Intravenous Q24H  . [DISCONTINUED] docusate sodium  100 mg Oral BID  . [DISCONTINUED] polyethylene glycol  17 g Oral Daily  . [DISCONTINUED] senna-docusate  1 tablet Oral BID   Insulin Requirements in the past 24 hours:  none  Current Nutrition:  TPN  Assessment: 62 yo F with recurrent small bowel obstructions after cystectomy and ileal conduit for bladder cancer in 2010.  As a result she has been unable to get adequate nutritional intake via GI tract without repeat obstructions & has been receiving TPN since August 2012.  She is followed at [facility].    Her electrolytes are WNL.  Her glucose at 0600 today was <120.  She is able to provide explicit information about her home TPN.  She does not use insulin at home.   She is well-controlled on her outpatient TPN however  her home supply has been depleted during this hospitalization.  We will transition to our formulary product;  Clinimix E 5/15 and continue the cyclic regimen.  Nutritional Goals:  25 kCal/kg/day, 0.8 grams/kg of protein per day   Plan:  1) Clinimix E 5/15 infused for 12 hours nightly.  Multivitamins added on MWF.  Lipids on Monday & Thursday. 2) Monitor labs per protocol  Valrie Hart A 08/29/2012,10:07 AM

## 2012-08-29 NOTE — Plan of Care (Signed)
Problem: Phase I Progression Outcomes Goal: Other Phase I Outcomes/Goals Outcome: Completed/Met Date Met:  08/29/12 Pt and daughter given handout regarding VRE.

## 2012-08-29 NOTE — Progress Notes (Signed)
[facility] PROGRESS NOTE  [name] ION:[id number] DOB: March 28, 1951 DOA: 08/25/2012 PCP: Milinda Antis, MD  Assessment/Plan: SBO (small bowel obstruction): hx of multiple SBO since cystectomy and ileal conduit for bladder cancer, mostly treated at [facility]. Presumably due to adhesions. NG tube was placed yesterday once patient was agreeable. She reports some improvement in her symptoms today. She's had a fair amount of output. She feels that her abdominal distention is improved and she's not having any nausea or vomiting. She's not had any bowel movements yet and is not passing flatus. We'll continue NG suction today and possibly clamp tube tomorrow. She has been encouraged to ambulate. Appreciate GI assistance.    Active Problems:   Hyponatremia: mild. Likely related to frequent stool. Continue IV fluids and monitor. Checking cdiff as well.   HYPOTHYROIDISM: TSH 9.3.  Will continue current dose IV synthroid. Her thyroid medication is being adjusted by her primary doctor. Will defer this for outpatient follow up.   Recurrent UTI: culture Culture yields multiple bacteria non predominant. Discontinue cefepime.  Microcytic anemia: likely combination IDA and chronic disease. Anemia panel yields iron 41. Chart review indicates current level at baseline. No s/sx active bleeding.   HYPERTENSION: controlled.   Hx of bladder cancer: s/p cystectomy and ileal conduit. Stable   On total parenteral nutrition (TPN): for last 18 months. Per pharmacy. This is followed at [facility].    Code Status: full Family Communication: daughter at bedside Disposition Plan: home when ready.    Consultants:  GI  Procedures:  none  Antibiotics:  Cefipime 08/25/12>>>08/29/12  HPI/Subjective: NG tube in place. Patient feels less abdominal pain today. She feels her abdominal distention is improving. She's not having nausea or vomiting. She's not had any bowel movements and is not  passing flatus.  Objective: Filed Vitals:   08/28/12 2143 08/29/12 0029 08/29/12 0322 08/29/12 0338  BP: 115/75 113/68 116/62   Pulse: 72 71 81   Temp: 98.1 F (36.7 C) 98.5 F (36.9 C) 98 F (36.7 C)   TempSrc: Oral Oral Oral   Resp: 20 20 20    Height:      Weight:    77.8 kg (171 lb 8.3 oz)  SpO2: 96% 97% 97%     Intake/Output Summary (Last 24 hours) at 08/29/12 1156 Last data filed at 08/29/12 0938  Gross per 24 hour  Intake 11387.08 ml  Output   3100 ml  Net 8287.08 ml   Filed Weights   08/27/12 0430 08/28/12 0507 08/29/12 0338  Weight: 75.4 kg (166 lb 3.6 oz) 77.3 kg (170 lb 6.7 oz) 77.8 kg (171 lb 8.3 oz)    Exam:   General:  Awake, alert no signs of distress  Cardiovascular: RRR No MGR No LEE  Respiratory: normal effort BSCTAB No rhonchi no wheeze  Abdomen: Less distention, soft, bowel sounds are present, nontender. No mass noted. Ostomy with pink stoma with clear urine.   Data Reviewed: Basic Metabolic Panel:  Recent Labs Lab 08/25/12 2034 08/26/12 0518 08/27/12 0600 08/28/12 0521 08/29/12 0556  NA 135 135 135 134* 134*  K 4.0 4.4 4.7 4.5 4.5  CL 100 102 105 103 103  CO2 26 25 23 22 23   GLUCOSE 81 87 132* 131* 118*  BUN 26* 23 25* 19 17  CREATININE 0.89 0.98 0.89 0.97 0.91  CALCIUM 9.3 8.8 8.4 8.8 8.7  MG  --   --  2.2  --   --   PHOS  --   --  4.0  --   --    Liver Function Tests:  Recent Labs Lab 08/25/12 2034 08/27/12 0600  AST 17 23  ALT 13 20  ALKPHOS 179* 181*  BILITOT 0.3 0.5  PROT 7.4 5.8*  ALBUMIN 3.5 2.6*    Recent Labs Lab 08/25/12 2034  LIPASE 12   No results found for this basename: AMMONIA,  in the last 168 hours CBC:  Recent Labs Lab 08/25/12 2034 08/26/12 0518 08/27/12 0600 08/28/12 0521 08/29/12 0556  WBC 6.2 4.8 4.3 5.3 3.9*  HGB 12.0 11.0* 10.5* 11.6* 11.1*  HCT 33.7* 31.3* 29.6* 33.1* 31.5*  MCV 75.1* 75.6* 75.3* 75.2* 75.4*  PLT 371 347 331 353 334   Cardiac Enzymes: No results found for  this basename: CKTOTAL, CKMB, CKMBINDEX, TROPONINI,  in the last 168 hours BNP (last 3 results) No results found for this basename: PROBNP,  in the last 8760 hours CBG: No results found for this basename: GLUCAP,  in the last 168 hours  Recent Results (from the past 240 hour(s))  URINE CULTURE     Status: None   Collection Time    08/25/12 11:20 PM      Result Value Range Status   Specimen Description URINE, CLEAN CATCH   Final   Special Requests NONE   Final   Culture  Setup Time 08/26/2012 15:34   Final   Colony Count 80,000 COLONIES/ML   Final   Culture     Final   Value: Multiple bacterial morphotypes present, none predominant. Suggest appropriate recollection if clinically indicated.   Report Status 08/26/2012 FINAL   Final  MRSA PCR SCREENING     Status: None   Collection Time    08/28/12  8:01 AM      Result Value Range Status   MRSA by PCR NEGATIVE  NEGATIVE Final   Comment:            The GeneXpert MRSA Assay (FDA     approved for NASAL specimens     only), is one component of a     comprehensive MRSA colonization     surveillance program. It is not     intended to diagnose MRSA     infection nor to guide or     monitor treatment for     MRSA infections.     Studies: Dg Abd Acute W/chest  08/28/2012  *RADIOLOGY REPORT*  Clinical Data: Small bowel obstruction.  ACUTE ABDOMEN SERIES (ABDOMEN 2 VIEW & CHEST 1 VIEW)  Comparison: CT scan dated 08/25/2012  Findings: The contrast given for the prior CT scan has passed into the nondistended colon.  There is still marked distention of multiple small bowel loops with air-fluid levels throughout the small bowel.  There is a moderate amount of fluid in the nondistended stomach.  No acute osseous abnormality.  There is slight atelectasis at the left lung base.  Lungs are otherwise clear.  PICC in good position.  Heart size and vascularity are normal.  IMPRESSION: Persistent partial small bowel obstruction.  Contrast has passed from  the small bowel into the colon.   Original Report Authenticated By: Francene Boyers, M.D.     Scheduled Meds: . ceFEPIme (MAXIPIME) 1 GM IVPB  1 g Intravenous Q12H  . enoxaparin (LOVENOX) injection  40 mg Subcutaneous Q24H  . levothyroxine  44 mcg Intravenous Daily  . pantoprazole (PROTONIX) IV  40 mg Intravenous Q24H   Continuous Infusions: . 0.9 % NaCl with KCl 20 mEq /  L 100 mL/hr at 08/29/12 1008  . TPN (CLINIMIX) +/- additives      Principal Problem:   SBO (small bowel obstruction) Active Problems:   HYPOTHYROIDISM   HYPERLIPIDEMIA   HYPERTENSION   Hx of bladder cancer   On total parenteral nutrition (TPN)   Recurrent UTI   Microcytic anemia   Hyponatremia    Time spent: 35 minutes    Jaquari Reckner  [facility]  If 8PM-8AM, please contact night-coverage at [email or web address], password [facility] 08/29/2012, 11:56 AM  LOS: 4 days

## 2012-08-29 NOTE — Progress Notes (Signed)
Patient education handout regarding "VRE" given to patient's daughter after family requested paperwork regarding patient's diagnosis.  Daughter states patient and family have never been told that patient had a history of VRE. 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 Daughter made aware of this.  Daughter also requested to speak to nursing supervisor regarding situation. Will make AC, Winnie, aware.

## 2012-08-29 NOTE — Progress Notes (Signed)
Subjective: Tolerating NG tube; less abdominal pain and nausea Objective: Vital signs in last 24 hours: Temp:  [97.8 F (36.6 C)-98.5 F (36.9 C)] 98.3 F (36.8 C) (03/01 1836) Pulse Rate:  [69-81] 69 (03/01 1836) Resp:  [20] 20 (03/01 1836) BP: (104-122)/(61-75) 122/69 mmHg (03/01 1836) SpO2:  [96 %-100 %] 100 % (03/01 1836) Weight:  [171 lb 8.3 oz (77.8 kg)] 171 lb 8.3 oz (77.8 kg) (03/01 0338) Last BM Date: 08/28/12 General:   Alert and cooperative in NAD; NG in place Abdomen: mildly distended. BS present ; mild diffuse tenderness Extremities:  Without clubbing or edema.    Intake/Output from previous day: 02/28 0701 - 03/01 0700 In: 11307.1 [P.O.:270; I.V.:8857.5; NG/GT:80; IV Piggyback:350; TPN:1749.6] Out: 2100 [Urine:1825; Emesis/NG output:275] Intake/Output this shift: Total I/O In: 176.7 [I.V.:136.7; NG/GT:40] Out: 150 [Emesis/NG output:150]  Lab Results:  Recent Labs  08/27/12 0600 08/28/12 0521 08/29/12 0556  WBC 4.3 5.3 3.9*  HGB 10.5* 11.6* 11.1*  HCT 29.6* 33.1* 31.5*  PLT 331 353 334   BMET  Recent Labs  08/27/12 0600 08/28/12 0521 08/29/12 0556  NA 135 134* 134*  K 4.7 4.5 4.5  CL 105 103 103  CO2 23 22 23   GLUCOSE 132* 131* 118*  BUN 25* 19 17  CREATININE 0.89 0.97 0.91  CALCIUM 8.4 8.8 8.7   LFT  Recent Labs  08/27/12 0600  PROT 5.8*  ALBUMIN 2.6*  AST 23  ALT 20  ALKPHOS 181*  BILITOT 0.5   PT/INR No results found for this basename: LABPROT, INR,  in the last 72 hours Hepatitis Panel No results found for this basename: HEPBSAG, HCVAB, HEPAIGM, HEPBIGM,  in the last 72 hours C-Diff No results found for this basename: CDIFFTOX,  in the last 72 hours  Studies/Results: Dg Abd Acute W/chest  08/28/2012  *RADIOLOGY REPORT*  Clinical Data: Small bowel obstruction.  ACUTE ABDOMEN SERIES (ABDOMEN 2 VIEW & CHEST 1 VIEW)  Comparison: CT scan dated 08/25/2012  Findings: The contrast given for the prior CT scan has passed into the  nondistended colon.  There is still marked distention of multiple small bowel loops with air-fluid levels throughout the small bowel.  There is a moderate amount of fluid in the nondistended stomach.  No acute osseous abnormality.  There is slight atelectasis at the left lung base.  Lungs are otherwise clear.  PICC in good position.  Heart size and vascularity are normal.  IMPRESSION: Persistent partial small bowel obstruction.  Contrast has passed from the small bowel into the colon.   Original Report Authenticated By: Francene Boyers, M.D.    Impression: SBO, clinically improved with NG tube Recommendations: Agree with plans to clamp NG tomorrow if she continues to progress

## 2012-08-30 DIAGNOSIS — Z789 Other specified health status: Secondary | ICD-10-CM

## 2012-08-30 DIAGNOSIS — E039 Hypothyroidism, unspecified: Secondary | ICD-10-CM

## 2012-08-30 LAB — BASIC METABOLIC PANEL
BUN: 19 mg/dL (ref 6–23)
Chloride: 102 mEq/L (ref 96–112)
GFR calc Af Amer: 74 mL/min — ABNORMAL LOW (ref >90)
GFR calc non Af Amer: 64 mL/min — ABNORMAL LOW (ref >90)
Glucose, Bld: 131 mg/dL — ABNORMAL HIGH (ref 70–99)
Potassium: 4.8 mEq/L (ref 3.5–5.1)
Sodium: 134 mEq/L — ABNORMAL LOW (ref 135–145)

## 2012-08-30 LAB — CBC
HCT: 31.6 % — ABNORMAL LOW (ref 36.0–46.0)
Hemoglobin: 11.2 g/dL — ABNORMAL LOW (ref 12.0–15.0)
RBC: 4.19 MIL/uL (ref 3.87–5.11)

## 2012-08-30 MED ORDER — CLINIMIX E/DEXTROSE (5/15) 5 % IV SOLN
INTRAVENOUS | Status: AC
  Administered 2012-08-30: 18:00:00 via INTRAVENOUS
  Filled 2012-08-30: qty 2000

## 2012-08-30 NOTE — Progress Notes (Signed)
Patient accidentally pulled her NG tube out.  Patient requested to leave NG tube out at this time.  On-call MD Aware no new orders given at this time.

## 2012-08-30 NOTE — Progress Notes (Signed)
PARENTERAL NUTRITION CONSULT NOTE  Pharmacy Consult for TPN Indication: SBO  Allergies  Allergen Reactions  . Bactrim (Sulfamethoxazole W-Trimethoprim)   . Codeine Hives  . Nitrofuran Derivatives Itching  . Penicillins Hives  . Sulfa Antibiotics Hives   Patient Measurements: Height: 5\' 5"  (165.1 cm) Weight: 168 lb 3.4 oz (76.3 kg) IBW/kg (Calculated) : 57  Vital Signs: Temp: 98.3 F (36.8 C) (03/02 3086) Temp src: Oral (03/01 2154) BP: 122/65 mmHg (03/02 0623) Pulse Rate: 72 (03/02 0623) Intake/Output from previous day: 03/01 0701 - 03/02 0700 In: 4810.3 [I.V.:2299.7; NG/GT:110; IV Piggyback:50; TPN:2350.6] Out: 6475 [Urine:5675; Emesis/NG output:800] Intake/Output from this shift:    Labs:  Recent Labs  08/28/12 0521 08/29/12 0556 08/30/12 0446  WBC 5.3 3.9* 8.0  HGB 11.6* 11.1* 11.2*  HCT 33.1* 31.5* 31.6*  PLT 353 334 280    Recent Labs  08/28/12 0521 08/29/12 0556 08/30/12 0446  NA 134* 134* 134*  K 4.5 4.5 4.8  CL 103 103 102  CO2 22 23 22   GLUCOSE 131* 118* 131*  BUN 19 17 19   CREATININE 0.97 0.91 0.94  CALCIUM 8.8 8.7 9.1   Estimated Creatinine Clearance: 64.2 ml/min (by C-G formula based on Cr of 0.94).   No results found for this basename: GLUCAP,  in the last 72 hours  Medical History: Past Medical History  Diagnosis Date  . Bowel obstruction several    Recurrent SBO secondary to adhesions  . Hypothyroidism   . Acid reflux   . Chronic back pain   . Hyperlipidemia   . Hypertension     pt says taken off medication since has lost weight.  . Cancer 2011 [facility])    diagnosed in 2009 per pt. Invasive High grade Urothelial carcinoma- s/p radiation and Chemo   . UTI (lower urinary tract infection)     Recurrent  . Malnutrition   . Leukopenia 12/06/2011    HIV serology negative  . Fatty liver    Medications:  Scheduled:  . enoxaparin (LOVENOX) injection  40 mg Subcutaneous Q24H  . levothyroxine  44 mcg Intravenous Daily  .  pantoprazole (PROTONIX) IV  40 mg Intravenous Q24H  . [DISCONTINUED] ceFEPIme (MAXIPIME) 1 GM IVPB  1 g Intravenous Q12H   Insulin Requirements in the past 24 hours:  none  Current Nutrition:  TPN  Assessment: 62 yo F with recurrent small bowel obstructions after cystectomy and ileal conduit for bladder cancer in 2010.  As a result she has been unable to get adequate nutritional intake via GI tract without repeat obstructions & has been receiving TPN since August 2012.  She is followed at [facility].    Her electrolytes are OK although Na is slightly below NL.  Her glucose at 0600 today was <140.  She is able to provide explicit information about her home TPN.  She does not use insulin at home.  She is well-controlled on her outpatient TPN however her home supply has been depleted during this hospitalization.  We will transition to our formulary product;  Clinimix E 5/15 and continue the cyclic regimen.  Nutritional Goals:  ~25 kCal/kg/day, ~1.2 grams/kg of protein per day   Plan:  1) Clinimix E 5/15 infused for 12 hours nightly.  Multivitamins added on MWF.  Lipids on Monday & Thursday. 2) Monitor labs per protocol  Valrie Hart A 08/30/2012,7:50 AM

## 2012-08-30 NOTE — Progress Notes (Signed)
Subjective: Feels less distended. Had a bowel movement since yesterday. 450 cc out of NG tube since yesterday.  Vital signs in last 24 hours: Temp:  [97.8 F (36.6 C)-98.5 F (36.9 C)] 98.3 F (36.8 C) (03/02 0623) Pulse Rate:  [63-72] 72 (03/02 0623) Resp:  [20] 20 (03/02 0623) BP: (104-135)/(61-85) 122/65 mmHg (03/02 0623) SpO2:  [95 %-100 %] 100 % (03/02 0623) Weight:  [168 lb 3.4 oz (76.3 kg)] 168 lb 3.4 oz (76.3 kg) (03/02 0411) Last BM Date: 08/30/12 General:   Alert,  Well-developed, well-nourished, pleasant and cooperative in NAD Abdomen:  Still somewhat distended. Bowel sounds present. The abdomen is soft and nontender.  Intake/Output from previous day: 03/01 0701 - 03/02 0700 In: 4810.3 [I.V.:2299.7; NG/GT:110; IV Piggyback:50; TPN:2350.6] Out: 6475 [Urine:5675; Emesis/NG output:800] Intake/Output this shift:    Lab Results:  Recent Labs  08/28/12 0521 08/29/12 0556 08/30/12 0446  WBC 5.3 3.9* 8.0  HGB 11.6* 11.1* 11.2*  HCT 33.1* 31.5* 31.6*  PLT 353 334 280   BMET  Recent Labs  08/28/12 0521 08/29/12 0556 08/30/12 0446  NA 134* 134* 134*  K 4.5 4.5 4.8  CL 103 103 102  CO2 22 23 22   GLUCOSE 131* 118* 131*  BUN 19 17 19   CREATININE 0.97 0.91 0.94  CALCIUM 8.8 8.7 9.1  Studies/Results: Dg Abd Acute W/chest  08/28/2012  *RADIOLOGY REPORT*  Clinical Data: Small bowel obstruction.  ACUTE ABDOMEN SERIES (ABDOMEN 2 VIEW & CHEST 1 VIEW)  Comparison: CT scan dated 08/25/2012  Findings: The contrast given for the prior CT scan has passed into the nondistended colon.  There is still marked distention of multiple small bowel loops with air-fluid levels throughout the small bowel.  There is a moderate amount of fluid in the nondistended stomach.  No acute osseous abnormality.  There is slight atelectasis at the left lung base.  Lungs are otherwise clear.  PICC in good position.  Heart size and vascularity are normal.  IMPRESSION: Persistent partial small bowel  obstruction.  Contrast has passed from the small bowel into the colon.   Original Report Authenticated By: Francene Boyers, M.D.    Impression: Partial small bowel obstruction-improved.  Recommendations: Clamped NGT. Sips of clear liquids today. Reassess in a.m.

## 2012-08-30 NOTE — Plan of Care (Signed)
Problem: Phase III Progression Outcomes Goal: Voiding independently Outcome: Completed/Met Date Met:  08/30/12 Pt has urostomy

## 2012-08-30 NOTE — Progress Notes (Signed)
[facility] PROGRESS NOTE  [name] VWU:[id number] DOB: January 29, 1951 DOA: 08/25/2012 PCP: Milinda Antis, MD  Assessment/Plan: SBO (small bowel obstruction): hx of multiple SBO since cystectomy and ileal conduit for bladder cancer, mostly treated at [facility]. Presumably due to adhesions. NG tube was placed on 2/28 which improved her symptoms. She feels that her abdominal distention is improved and she's not having any nausea or vomiting. Head p.m. yesterday and again this morning.  Will plan to clamp NG tube and start sips of clears.    Active Problems:   HYPOTHYROIDISM: TSH 9.3.  Will continue current dose IV synthroid. Her thyroid medication is being adjusted by her primary doctor. Will defer this for outpatient follow up.   Recurrent UTI: culture Culture yields multiple bacteria non predominant. Discontinue cefepime.  Microcytic anemia: likely combination IDA and chronic disease. Anemia panel yields iron 41. Chart review indicates current level at baseline. No s/sx active bleeding.   HYPERTENSION: controlled.   Hx of bladder cancer: s/p cystectomy and ileal conduit. Stable   On total parenteral nutrition (TPN): for last 18 months. Per pharmacy. This is followed at [facility].    Code Status: full Family Communication: daughter at bedside Disposition Plan: home when ready.    Consultants:  GI  Procedures:  none  Antibiotics:  Cefipime 08/25/12>>>08/29/12  HPI/Subjective: NG tube in place. Patient felt much better. A bowel movement yesterday and again this morning. No complaints.  Objective: Filed Vitals:   08/30/12 0215 08/30/12 0411 08/30/12 0623 08/30/12 1009  BP: 132/85  122/65 117/68  Pulse: 63  72 75  Temp: 98.5 F (36.9 C)  98.3 F (36.8 C) 97.5 F (36.4 C)  TempSrc:      Resp: 20  20 20   Height:      Weight:  76.3 kg (168 lb 3.4 oz)    SpO2: 95%  100% 99%    Intake/Output Summary (Last 24 hours) at 08/30/12 1046 Last data  filed at 08/30/12 0845  Gross per 24 hour  Intake 4730.27 ml  Output   6475 ml  Net -1744.73 ml   Filed Weights   08/28/12 0507 08/29/12 0338 08/30/12 0411  Weight: 77.3 kg (170 lb 6.7 oz) 77.8 kg (171 lb 8.3 oz) 76.3 kg (168 lb 3.4 oz)    Exam:   General:  Awake, alert no signs of distress  Cardiovascular: RRR, S1-S2  Respiratory: Clear to auscultation bilaterally  Abdomen: Soft, nondistended, hyperactive bowel sounds Ostomy with pink stoma with clear urine.  Extremities: No clubbing or cyanosis or edema   Data Reviewed: Basic Metabolic Panel:  Recent Labs Lab 08/26/12 0518 08/27/12 0600 08/28/12 0521 08/29/12 0556 08/30/12 0446  NA 135 135 134* 134* 134*  K 4.4 4.7 4.5 4.5 4.8  CL 102 105 103 103 102  CO2 25 23 22 23 22   GLUCOSE 87 132* 131* 118* 131*  BUN 23 25* 19 17 19   CREATININE 0.98 0.89 0.97 0.91 0.94  CALCIUM 8.8 8.4 8.8 8.7 9.1  MG  --  2.2  --   --   --   PHOS  --  4.0  --   --   --    Liver Function Tests:  Recent Labs Lab 08/25/12 2034 08/27/12 0600  AST 17 23  ALT 13 20  ALKPHOS 179* 181*  BILITOT 0.3 0.5  PROT 7.4 5.8*  ALBUMIN 3.5 2.6*    Recent Labs Lab 08/25/12 2034  LIPASE 12   CBC:  Recent Labs Lab  08/26/12 0518 08/27/12 0600 08/28/12 0521 08/29/12 0556 08/30/12 0446  WBC 4.8 4.3 5.3 3.9* 8.0  HGB 11.0* 10.5* 11.6* 11.1* 11.2*  HCT 31.3* 29.6* 33.1* 31.5* 31.6*  MCV 75.6* 75.3* 75.2* 75.4* 75.4*  PLT 347 331 353 334 280     Recent Results (from the past 240 hour(s))  URINE CULTURE     Status: None   Collection Time    08/25/12 11:20 PM      Result Value Range Status   Specimen Description URINE, CLEAN CATCH   Final   Special Requests NONE   Final   Culture  Setup Time 08/26/2012 15:34   Final   Colony Count 80,000 COLONIES/ML   Final   Culture     Final   Value: Multiple bacterial morphotypes present, none predominant. Suggest appropriate recollection if clinically indicated.   Report Status 08/26/2012  FINAL   Final  MRSA PCR SCREENING     Status: None   Collection Time    08/28/12  8:01 AM      Result Value Range Status   MRSA by PCR NEGATIVE  NEGATIVE Final   Comment:            The GeneXpert MRSA Assay (FDA     approved for NASAL specimens     only), is one component of a     comprehensive MRSA colonization     surveillance program. It is not     intended to diagnose MRSA     infection nor to guide or     monitor treatment for     MRSA infections.     Studies: Dg Abd Acute W/chest  08/28/2012 IMPRESSION: Persistent partial small bowel obstruction.  Contrast has passed from the small bowel into the colon.   Original Report Authenticated By: Francene Boyers, M.D.     Scheduled Meds: . enoxaparin (LOVENOX) injection  40 mg Subcutaneous Q24H  . levothyroxine  44 mcg Intravenous Daily  . pantoprazole (PROTONIX) IV  40 mg Intravenous Q24H   Continuous Infusions: . 0.9 % NaCl with KCl 20 mEq / L 100 mL/hr at 08/30/12 0218  . TPN (CLINIMIX) +/- additives      Principal Problem:   SBO (small bowel obstruction) Active Problems:   HYPOTHYROIDISM   HYPERLIPIDEMIA   HYPERTENSION   Hx of bladder cancer   On total parenteral nutrition (TPN)   Recurrent UTI   Microcytic anemia   Hyponatremia    Time spent: 25 minutes    Hollice Espy  [facility] [phone]  If 7PM-7AM, please contact night-coverage at [email or web address], password [facility] 08/30/2012, 10:46 AM  LOS: 5 days

## 2012-08-31 DIAGNOSIS — K7689 Other specified diseases of liver: Secondary | ICD-10-CM

## 2012-08-31 DIAGNOSIS — K56609 Unspecified intestinal obstruction, unspecified as to partial versus complete obstruction: Secondary | ICD-10-CM

## 2012-08-31 LAB — DIFFERENTIAL
Basophils Absolute: 0 10*3/uL (ref 0.0–0.1)
Lymphocytes Relative: 17 % (ref 12–46)
Neutro Abs: 5.2 10*3/uL (ref 1.7–7.7)
Neutrophils Relative %: 74 % (ref 43–77)

## 2012-08-31 LAB — COMPREHENSIVE METABOLIC PANEL
ALT: 23 U/L (ref 0–35)
Alkaline Phosphatase: 197 U/L — ABNORMAL HIGH (ref 39–117)
BUN: 22 mg/dL (ref 6–23)
CO2: 18 mEq/L — ABNORMAL LOW (ref 19–32)
GFR calc Af Amer: 79 mL/min — ABNORMAL LOW (ref >90)
GFR calc non Af Amer: 69 mL/min — ABNORMAL LOW (ref >90)
Glucose, Bld: 148 mg/dL — ABNORMAL HIGH (ref 70–99)
Potassium: 4.6 mEq/L (ref 3.5–5.1)
Sodium: 133 mEq/L — ABNORMAL LOW (ref 135–145)
Total Bilirubin: 0.5 mg/dL (ref 0.3–1.2)

## 2012-08-31 LAB — CBC
HCT: 35.1 % — ABNORMAL LOW (ref 36.0–46.0)
Platelets: 378 10*3/uL (ref 150–400)
RDW: 15.8 % — ABNORMAL HIGH (ref 11.5–15.5)
WBC: 7.1 10*3/uL (ref 4.0–10.5)

## 2012-08-31 LAB — CHOLESTEROL, TOTAL: Cholesterol: 151 mg/dL (ref 0–200)

## 2012-08-31 LAB — CLOSTRIDIUM DIFFICILE BY PCR: Toxigenic C. Difficile by PCR: NEGATIVE

## 2012-08-31 LAB — PHOSPHORUS: Phosphorus: 4.1 mg/dL (ref 2.3–4.6)

## 2012-08-31 MED ORDER — BOOST / RESOURCE BREEZE PO LIQD
1.0000 | Freq: Three times a day (TID) | ORAL | Status: DC
  Administered 2012-09-01: 1 via ORAL

## 2012-08-31 MED ORDER — LEVOTHYROXINE SODIUM 88 MCG PO TABS
88.0000 ug | ORAL_TABLET | Freq: Every day | ORAL | Status: DC
  Administered 2012-09-01: 88 ug via ORAL
  Filled 2012-08-31 (×3): qty 1

## 2012-08-31 MED ORDER — TRACE MINERALS CR-CU-F-FE-I-MN-MO-SE-ZN IV SOLN
INTRAVENOUS | Status: AC
  Administered 2012-08-31 (×2): via INTRAVENOUS
  Filled 2012-08-31: qty 2000

## 2012-08-31 MED ORDER — FAT EMULSION 20 % IV EMUL
250.0000 mL | INTRAVENOUS | Status: AC
  Administered 2012-08-31: 250 mL via INTRAVENOUS
  Filled 2012-08-31: qty 250

## 2012-08-31 NOTE — Progress Notes (Signed)
[facility] PROGRESS NOTE  [name] [id number]:[id number] DOB: [birth year] DOA: 08/25/2012 PCP: Milinda Antis, MD  Assessment/Plan: SBO (small bowel obstruction): hx of multiple SBO since cystectomy and ileal conduit for bladder cancer, mostly treated at [facility]. Presumably due to adhesions. NG tube was placed on 2/28 which improved her symptoms. She feels that her abdominal distention is improved and she's not having any nausea or vomiting. Minimal pain on clear liquids. NG tube fell out and so we will leave it out. Advancing to full liquids and then solids. Discharged likely tomorrow    Active Problems:   HYPOTHYROIDISM: TSH 9.3.  Will continue current dose IV synthroid. Her thyroid medication is being adjusted by her primary doctor. Will defer this for outpatient follow up.   Recurrent UTI: culture Culture yields multiple bacteria non predominant. Discontinue cefepime.  Microcytic anemia: likely combination IDA and chronic disease. Anemia panel yields iron 41. Chart review indicates current level at baseline. No s/sx active bleeding.   HYPERTENSION: controlled.   Hx of bladder cancer: s/p cystectomy and ileal conduit. Stable   On total parenteral nutrition (TPN): for last 18 months. Per pharmacy. This is followed at [facility].    Code Status: full Family Communication: daughter at bedside Disposition Plan: home likely tomorrow   Consultants:  GI  Procedures:  none  Antibiotics:  Cefipime 08/25/12>>>08/29/12  HPI/Subjective: Patient feeling good. Minimal pain earlier with clear liquids, but not too severe. Has had several bowel movements  Objective: Filed Vitals:   08/30/12 1411 08/30/12 1856 08/30/12 2201 08/31/12 0525  BP: 116/78 120/72 122/68 95/61  Pulse: 72 70 65 84  Temp: 98.3 F (36.8 C) 97.7 F (36.5 C) 98.2 F (36.8 C) 98 F (36.7 C)  TempSrc:  Oral Oral Oral  Resp: 20 20 20 20   Height:      Weight:    74.8 kg (164 lb 14.5 oz)   SpO2: 95% 96% 98% 94%    Intake/Output Summary (Last 24 hours) at 08/31/12 1200 Last data filed at 08/31/12 0600  Gross per 24 hour  Intake   5177 ml  Output   3600 ml  Net   1577 ml   Filed Weights   08/29/12 0338 08/30/12 0411 08/31/12 0525  Weight: 77.8 kg (171 lb 8.3 oz) 76.3 kg (168 lb 3.4 oz) 74.8 kg (164 lb 14.5 oz)    Exam:   General:  Awake, alert no signs of distress  Cardiovascular: RRR, S1-S2  Respiratory: Clear to auscultation bilaterally  Abdomen: Soft, nondistended, hyperactive bowel sounds Ostomy with pink stoma with clear urine.  Extremities: No clubbing or cyanosis or edema   Data Reviewed: Basic Metabolic Panel:  Recent Labs Lab 08/27/12 0600 08/28/12 0521 08/29/12 0556 08/30/12 0446 08/31/12 0629  NA 135 134* 134* 134* 133*  K 4.7 4.5 4.5 4.8 4.6  CL 105 103 103 102 103  CO2 23 22 23 22  18*  GLUCOSE 132* 131* 118* 131* 148*  BUN 25* 19 17 19 22   CREATININE 0.89 0.97 0.91 0.94 0.89  CALCIUM 8.4 8.8 8.7 9.1 9.4  MG 2.2  --   --   --  1.4*  PHOS 4.0  --   --   --  4.1   Liver Function Tests:  Recent Labs Lab 08/25/12 2034 08/27/12 0600 08/31/12 0629  AST 17 23 33  ALT 13 20 23   ALKPHOS 179* 181* 197*  BILITOT 0.3 0.5 0.5  PROT 7.4 5.8* 7.5  ALBUMIN 3.5 2.6* 3.3*  Recent Labs Lab 08/25/12 2034  LIPASE 12   CBC:  Recent Labs Lab 08/27/12 0600 08/28/12 0521 08/29/12 0556 08/30/12 0446 08/31/12 0629  WBC 4.3 5.3 3.9* 8.0 7.1  NEUTROABS  --   --   --   --  5.2  HGB 10.5* 11.6* 11.1* 11.2* 12.6  HCT 29.6* 33.1* 31.5* 31.6* 35.1*  MCV 75.3* 75.2* 75.4* 75.4* 75.0*  PLT 331 353 334 280 378     Recent Results (from the past 240 hour(s))  URINE CULTURE     Status: None   Collection Time    08/25/12 11:20 PM      Result Value Range Status   Specimen Description URINE, CLEAN CATCH   Final   Special Requests NONE   Final   Culture  Setup Time 08/26/2012 15:34   Final   Colony Count 80,000 COLONIES/ML   Final    Culture     Final   Value: Multiple bacterial morphotypes present, none predominant. Suggest appropriate recollection if clinically indicated.   Report Status 08/26/2012 FINAL   Final  MRSA PCR SCREENING     Status: None   Collection Time    08/28/12  8:01 AM      Result Value Range Status   MRSA by PCR NEGATIVE  NEGATIVE Final   Comment:            The GeneXpert MRSA Assay (FDA     approved for NASAL specimens     only), is one component of a     comprehensive MRSA colonization     surveillance program. It is not     intended to diagnose MRSA     infection nor to guide or     monitor treatment for     MRSA infections.  CLOSTRIDIUM DIFFICILE BY PCR     Status: None   Collection Time    08/30/12 10:41 PM      Result Value Range Status   C difficile by pcr NEGATIVE  NEGATIVE Final     Studies: Dg Abd Acute W/chest  08/28/2012 IMPRESSION: Persistent partial small bowel obstruction.  Contrast has passed from the small bowel into the colon.   Original Report Authenticated By: Francene Boyers, M.D.     Scheduled Meds: . enoxaparin (LOVENOX) injection  40 mg Subcutaneous Q24H  . levothyroxine  44 mcg Intravenous Daily  . pantoprazole (PROTONIX) IV  40 mg Intravenous Q24H   Continuous Infusions: . 0.9 % NaCl with KCl 20 mEq / L 100 mL/hr at 08/30/12 2056  . TPN (CLINIMIX) +/- additives     And  . fat emulsion      Principal Problem:   SBO (small bowel obstruction) Active Problems:   HYPOTHYROIDISM   HYPERLIPIDEMIA   HYPERTENSION   Hx of bladder cancer   On total parenteral nutrition (TPN)   Recurrent UTI   Microcytic anemia   Hyponatremia    Time spent: 15 minutes    Hollice Espy  [facility] [phone]  If 7PM-7AM, please contact night-coverage at [email or web address], password [facility] 08/31/2012, 12:00 PM  LOS: 6 days

## 2012-08-31 NOTE — Progress Notes (Signed)
UR Chart Review Completed  

## 2012-08-31 NOTE — Progress Notes (Signed)
Nutrition Follow-up   INTERVENTION:  TPN/MVI per pharmacy  Add Resource Breeze po TID, each supplement provides 250 kcal and 9 grams of protein.  NUTRITION DIAGNOSIS: Inadequate oral intake related to limited po tol as evidenced by SBO, oral intake 0-20% of clear and full liquid diet.   Goal: Pt to meet >/= 90% of their estimated nutrition needs  Monitor:  Nutrition status reassess needs as indicated  Reason for Assessment: Home TPN  62 y.o. female  Admitting Dx: SBO (small bowel obstruction)  ASSESSMENT: Pt diet advanced now to full liquids. Her daughter tells pt doesn't like Ensure but will drink juice type supplement. Will add [employer].   She's receiving cyclic TPN. Clinimix E 5/15 (2000 ml/q 24 hr), MVI- on MWF, 20% Lipids M/Th.  Her wt continues to be stable. Possible d/c tomorrow.   Additional IVF with NS with KCL @ 100 ml/hr.  Height: Ht Readings from Last 1 Encounters:  08/26/12 5\' 5"  (1.651 m)    Weight: Wt Readings from Last 1 Encounters:  08/31/12 164 lb 14.5 oz (74.8 kg)    Ideal Body Weight: 125# (56.8 kg)  % Ideal Body Weight: 133%  Wt Readings from Last 10 Encounters:  08/31/12 164 lb 14.5 oz (74.8 kg)  07/21/12 160 lb (72.576 kg)  05/26/12 163 lb 9.6 oz (74.208 kg)  05/21/12 160 lb 1.9 oz (72.63 kg)  05/01/12 164 lb 6.4 oz (74.571 kg)  03/27/12 158 lb (71.668 kg)  02/20/12 160 lb (72.576 kg)  02/18/12 159 lb 1.9 oz (72.176 kg)  02/07/12 158 lb 12.8 oz (72.031 kg)  12/19/11 159 lb 1.3 oz (72.158 kg)    Usual Body Weight: 160# (72.7 kg)  % Usual Body Weight: 104%  BMI:  Body mass index is 27.44 kg/(m^2). Overweight  Estimated Nutritional Needs: Kcal: 1425-1710 Protein: 68-75 gr Fluid: >1700 ml/day  Skin: no issues noted  Diet Order: Full Liquid  EDUCATION NEEDS: -No education needs identified at this time   Intake/Output Summary (Last 24 hours) at 08/31/12 1505 Last data filed at 08/31/12 1501  Gross per 24 hour   Intake   5177 ml  Output   4800 ml  Net    377 ml    Last BM: 08/31/12 abdomen distended, tender  Labs:   Recent Labs Lab 08/27/12 0600  08/29/12 0556 08/30/12 0446 08/31/12 0629  NA 135  < > 134* 134* 133*  K 4.7  < > 4.5 4.8 4.6  CL 105  < > 103 102 103  CO2 23  < > 23 22 18*  BUN 25*  < > 17 19 22   CREATININE 0.89  < > 0.91 0.94 0.89  CALCIUM 8.4  < > 8.7 9.1 9.4  MG 2.2  --   --   --  1.4*  PHOS 4.0  --   --   --  4.1  GLUCOSE 132*  < > 118* 131* 148*  < > = values in this interval not displayed.  CBG (last 3)  No results found for this basename: GLUCAP,  in the last 72 hours  Scheduled Meds: . enoxaparin (LOVENOX) injection  40 mg Subcutaneous Q24H  . levothyroxine  44 mcg Intravenous Daily  . pantoprazole (PROTONIX) IV  40 mg Intravenous Q24H    Continuous Infusions: . 0.9 % NaCl with KCl 20 mEq / L 100 mL/hr (08/31/12 1215)  . TPN (CLINIMIX) +/- additives     And  . fat emulsion  Past Medical History  Diagnosis Date  . Bowel obstruction several    Recurrent SBO secondary to adhesions  . Hypothyroidism   . Acid reflux   . Chronic back pain   . Hyperlipidemia   . Hypertension     pt says taken off medication since has lost weight.  . Cancer 2011 [facility])    diagnosed in 2009 per pt. Invasive High grade Urothelial carcinoma- s/p radiation and Chemo   . UTI (lower urinary tract infection)     Recurrent  . Malnutrition   . Leukopenia 12/06/2011    HIV serology negative  . Fatty liver     Past Surgical History  Procedure Laterality Date  . Back surgery      X2  . Hernia repair      mesh  . Abdominal surgery      with intestinal "puncture" x 2, exploratory surgery   . Cholecystectomy    . Ileo conduit      For bladder cancer  . Cystectomy      breast  . Portacath placement    . Bladder removal      Royann Shivers MS,RD,LDN,CSG Office: #[phone] Pager: [phone]

## 2012-08-31 NOTE — Progress Notes (Addendum)
Subjective: Denies nausea or vomiting,  Generalized abdominal pain 4/10 at worst, improves w/ meds.  Tolerating clear liquids, advanced to full liquids today.  Passing flatus.   Objective: Vital signs in last 24 hours: Temp:  [97.5 F (36.4 C)-98.3 F (36.8 C)] 98 F (36.7 C) (03/03 0525) Pulse Rate:  [65-84] 84 (03/03 0525) Resp:  [20] 20 (03/03 0525) BP: (95-122)/(61-78) 95/61 mmHg (03/03 0525) SpO2:  [94 %-99 %] 94 % (03/03 0525) Weight:  [164 lb 14.5 oz (74.8 kg)] 164 lb 14.5 oz (74.8 kg) (03/03 0525) Last BM Date: 08/31/12 No LMP recorded. Patient is postmenopausal. Body mass index is 27.44 kg/(m^2). General:   Alert,  Well-developed, pleasant and cooperative. Daughter at bedside. Eyes:  Sclera clear, no icterus.   Conjunctiva pink. Mouth:  oropharynx pink & moist. Heart:  Regular rate and rhythm Abdomen:   +beefy red stoma with clear urine in foley. +BS.  Soft, NT.  No guarding & rebound tenderness.   Msk:  Symmetrical. Pulses:  Normal pulses noted. Extremities:  Without edema. Neurologic:  Alert and  oriented x4;  grossly normal neurologically. Skin: Diaphoretic, no rash or jaundice.   Cervical Nodes:  No significant cervical adenopathy. Psych:  Alert and cooperative.  +tearful.  Intake/Output from previous day: 03/02 0701 - 03/03 0700 In: 5207 [P.O.:600; I.V.:2500; NG/GT:30; TPN:2077] Out: 4600 [Urine:4600]  Lab Results:  Recent Labs  08/29/12 0556 08/30/12 0446 08/31/12 0629  WBC 3.9* 8.0 7.1  HGB 11.1* 11.2* 12.6  HCT 31.5* 31.6* 35.1*  PLT 334 280 378   BMET  Recent Labs  08/29/12 0556 08/30/12 0446 08/31/12 0629  NA 134* 134* 133*  K 4.5 4.8 4.6  CL 103 102 103  CO2 23 22 18*  GLUCOSE 118* 131* 148*  BUN 17 19 22   CREATININE 0.91 0.94 0.89  CALCIUM 8.7 9.1 9.4   LFT  Recent Labs  08/31/12 0629  PROT 7.5  ALBUMIN 3.3*  AST 33  ALT 23  ALKPHOS 197*  BILITOT 0.5  Assessment: 1. Recurrent SBO secondary to adhesive disease:  Improving  slowly.  Followed by [facility] (next appt 3/19) 2. Nutrition:  Remains on TPN.  Advancing diet to full liquids today.  Plan:  1. Continue current plan of care 2. Advance diet as tolerated 3. Will follow peripherally    LOS: 6 days   Lorenza Burton  08/31/2012, 9:57 AM   As above. Anticipate discharge soon.

## 2012-08-31 NOTE — Progress Notes (Signed)
PARENTERAL NUTRITION CONSULT NOTE  Pharmacy Consult for TPN Indication: SBO  Allergies  Allergen Reactions  . Bactrim (Sulfamethoxazole W-Trimethoprim)   . Codeine Hives  . Nitrofuran Derivatives Itching  . Penicillins Hives  . Sulfa Antibiotics Hives   Patient Measurements: Height: 5\' 5"  (165.1 cm) Weight: 164 lb 14.5 oz (74.8 kg) IBW/kg (Calculated) : 57  Vital Signs: Temp: 98 F (36.7 C) (03/03 0525) Temp src: Oral (03/03 0525) BP: 95/61 mmHg (03/03 0525) Pulse Rate: 84 (03/03 0525) Intake/Output from previous day: 03/02 0701 - 03/03 0700 In: 5207 [P.O.:600; I.V.:2500; NG/GT:30; TPN:2077] Out: 4600 [Urine:4600] Intake/Output from this shift:    Labs:  Recent Labs  08/29/12 0556 08/30/12 0446 08/31/12 0629  WBC 3.9* 8.0 7.1  HGB 11.1* 11.2* 12.6  HCT 31.5* 31.6* 35.1*  PLT 334 280 378    Recent Labs  08/29/12 0556 08/30/12 0446 08/31/12 0629 08/31/12 0702  NA 134* 134* 133*  --   K 4.5 4.8 4.6  --   CL 103 102 103  --   CO2 23 22 18*  --   GLUCOSE 118* 131* 148*  --   BUN 17 19 22   --   CREATININE 0.91 0.94 0.89  --   CALCIUM 8.7 9.1 9.4  --   MG  --   --  1.4*  --   PHOS  --   --  4.1  --   PROT  --   --  7.5  --   ALBUMIN  --   --  3.3*  --   AST  --   --  33  --   ALT  --   --  23  --   ALKPHOS  --   --  197*  --   BILITOT  --   --  0.5  --   TRIG  --   --   --  77  CHOL  --   --   --  151   Estimated Creatinine Clearance: 67.2 ml/min (by C-G formula based on Cr of 0.89).   No results found for this basename: GLUCAP,  in the last 72 hours  Medical History: Past Medical History  Diagnosis Date  . Bowel obstruction several    Recurrent SBO secondary to adhesions  . Hypothyroidism   . Acid reflux   . Chronic back pain   . Hyperlipidemia   . Hypertension     pt says taken off medication since has lost weight.  . Cancer 2011 [facility])    diagnosed in 2009 per pt. Invasive High grade Urothelial carcinoma- s/p radiation and Chemo    . UTI (lower urinary tract infection)     Recurrent  . Malnutrition   . Leukopenia 12/06/2011    HIV serology negative  . Fatty liver    Medications:  Scheduled:  . enoxaparin (LOVENOX) injection  40 mg Subcutaneous Q24H  . levothyroxine  44 mcg Intravenous Daily  . pantoprazole (PROTONIX) IV  40 mg Intravenous Q24H   Insulin Requirements in the past 24 hours:  none  Current Nutrition:  TPN  Assessment: 62 yo F with recurrent small bowel obstructions after cystectomy and ileal conduit for bladder cancer in 2010.  As a result she has been unable to get adequate nutritional intake via GI tract without repeat obstructions & has been receiving TPN since August 2012.  She is followed at [facility].    Her electrolytes are OK although Na is slightly below NL.  She is  afebrile.  Her glucose at 0600 today was <150.  She is able to provide explicit information about her home TPN.  She does not use insulin at home.  She is well-controlled on her outpatient TPN however her home supply has been depleted during this hospitalization.  We will transition to our formulary product;  Clinimix E 5/15 and continue the cyclic regimen.  TG's OK.  Prealbumin pending.  I/O = + 600  Nutritional Goals:  ~25 kCal/kg/day, ~1.2 grams/kg of protein per day   Plan:  1) Clinimix E 5/15 infused for 12 hours nightly.  Multivitamins added on MWF.  Lipids on Monday & Thursday. 2) Monitor labs per protocol  Valrie Hart A 08/31/2012,10:34 AM

## 2012-09-01 DIAGNOSIS — K56609 Unspecified intestinal obstruction, unspecified as to partial versus complete obstruction: Secondary | ICD-10-CM

## 2012-09-01 LAB — COMPREHENSIVE METABOLIC PANEL
ALT: 28 U/L (ref 0–35)
AST: 29 U/L (ref 0–37)
Calcium: 9.5 mg/dL (ref 8.4–10.5)
GFR calc Af Amer: 78 mL/min — ABNORMAL LOW (ref >90)
Sodium: 133 mEq/L — ABNORMAL LOW (ref 135–145)
Total Protein: 7.5 g/dL (ref 6.0–8.3)

## 2012-09-01 LAB — CBC
HCT: 36 % (ref 36.0–46.0)
Hemoglobin: 12.9 g/dL (ref 12.0–15.0)
MCH: 26.9 pg (ref 26.0–34.0)
MCHC: 35.8 g/dL (ref 30.0–36.0)
RBC: 4.79 MIL/uL (ref 3.87–5.11)

## 2012-09-01 LAB — PHOSPHORUS: Phosphorus: 3.7 mg/dL (ref 2.3–4.6)

## 2012-09-01 LAB — PREALBUMIN: Prealbumin: 16.4 mg/dL — ABNORMAL LOW (ref 17.0–34.0)

## 2012-09-01 MED ORDER — PROMETHAZINE HCL 12.5 MG PO TABS: 12.5000 mg | ORAL_TABLET | Freq: Three times a day (TID) | ORAL | Status: DC | PRN

## 2012-09-01 MED ORDER — ALTEPLASE 2 MG IJ SOLR
2.0000 mg | Freq: Once | INTRAMUSCULAR | Status: AC
  Administered 2012-09-01: 2 mg
  Filled 2012-09-01: qty 2

## 2012-09-01 NOTE — Progress Notes (Signed)
REVIEWED.  WOULD DISCUSS BENEFITS V. RISKS OF BOWEL PREP WITH PT PRIOR TO COLONOSCOPY DUE TO HER KNOWN HISTORY OF SBO/ADHESIVE DISEASE.

## 2012-09-01 NOTE — Progress Notes (Signed)
Subjective: Mild nausea, no vomiting. +BM. Decreased abdominal discomfort. Did not like breakfast so ate very little.   Objective: Vital signs in last 24 hours: Temp:  [98.2 F (36.8 C)-98.3 F (36.8 C)] 98.3 F (36.8 C) (03/04 0440) Pulse Rate:  [75-91] 91 (03/04 0440) Resp:  [20] 20 (03/04 0440) BP: (112-117)/(74-79) 117/77 mmHg (03/04 0440) SpO2:  [96 %-97 %] 97 % (03/04 0440) Weight:  [159 lb 6.3 oz (72.3 kg)] 159 lb 6.3 oz (72.3 kg) (03/04 0440) Last BM Date: 08/31/12 General:   Alert and oriented, pleasant Head:  Normocephalic and atraumatic. Eyes:  No icterus, sclera clear. Conjuctiva pink.  Heart:  S1, S2 present, no murmurs noted.  Lungs: Clear to auscultation bilaterally, without wheezing, rales, or rhonchi.  Abdomen:  Bowel sounds present, soft, non-tender, non-distended. Ostomy in place with clear yellow urine, pinkish/red stoma Extremities:  Without edema. Neurologic:  Alert and  oriented x4;  grossly normal neurologically. Skin:  Warm and dry, intact without significant lesions.  Psych:  Alert and cooperative. Normal mood and affect.  Intake/Output from previous day: 03/03 0701 - 03/04 0700 In: 5578 [I.V.:2421.7; TPN:3156.3] Out: 3200 [Urine:3200] Intake/Output this shift:    Lab Results:  Recent Labs  08/30/12 0446 08/31/12 0629  WBC 8.0 7.1  HGB 11.2* 12.6  HCT 31.6* 35.1*  PLT 280 378   BMET  Recent Labs  08/30/12 0446 08/31/12 0629  NA 134* 133*  K 4.8 4.6  CL 102 103  CO2 22 18*  GLUCOSE 131* 148*  BUN 19 22  CREATININE 0.94 0.89  CALCIUM 9.1 9.4   LFT  Recent Labs  08/31/12 0629  PROT 7.5  ALBUMIN 3.3*  AST 33  ALT 23  ALKPHOS 197*  BILITOT 0.5    Assessment: 62 year old female with history of recurrent small bowel obstructions secondary to adhesive disease; she is followed by [facility] with her next appointment 3/19. Requires TPN for nutrition. Clinically improving with hopeful discharge in very near future.   Plan: Hopeful  d/c today Consider lower GI evaluation for screening purposes as outpatient. May be difficult secondary to current health status. Virtual colonoscopy offered in recent past, but this is a large amount of out-of-pocket expense.    LOS: 7 days   Gerrit Halls  09/01/2012, 9:17 AM

## 2012-09-01 NOTE — Progress Notes (Signed)
Pt. D/c instructions reviewed, pt. Prescriptions given, IV removed, and follow ups reviewed.

## 2012-09-01 NOTE — Discharge Summary (Signed)
Physician Discharge Summary  [name] XLK:[id number] DOB: 26-May-1951 DOA: 08/25/2012  PCP: Milinda Antis, MD  Admit date: 08/25/2012 Discharge date: 09/01/2012  Time spent: 25 minutes  Recommendations for Outpatient Follow-up:  1. Patient will follow up with her primary care physician in the next month. 2. Prior to discharge, she is having her PICC line dressing changed and she normally has this done weekly on Tuesdays 3. She is having full blood work right before discharge done to prepare her TPN  Discharge Diagnoses:  Principal Problem:   SBO (small bowel obstruction) Active Problems:   HYPOTHYROIDISM   HYPERLIPIDEMIA   HYPERTENSION   Hx of bladder cancer   On total parenteral nutrition (TPN)   Recurrent UTI   Microcytic anemia   Hyponatremia   Discharge Condition: Improved, being discharged home  Diet recommendation: Patient will continue on a mechanical soft, low-fat diet plus TPN  Filed Weights   08/30/12 0411 08/31/12 0525 09/01/12 0440  Weight: 76.3 kg (168 lb 3.4 oz) 74.8 kg (164 lb 14.5 oz) 72.3 kg (159 lb 6.3 oz)    History of present illness:  [name] is an 62 y.o. female. Pleasant middle-aged African American lady status post cystectomy and ileal conduit for bladder cancer in 2010. Since then has had recurrent episodes of small bowel obstruction treated conservatively with NG tube She had a nine-month symptom free until January 2014 when she was readmitted to [facility] for small bowel obstruction and uti.. because of recurrent bowel problems she is unable to get adequate nutritional intake via GI tract without subsequent obstruction, so has a PICC line and has been receiving TPN since August 2012.  Has been having nausea and colicky abdominal pain, but no vomiting for the past few days.  CT scan of the abdomen shows dilated fluid-filled small bowel with decompressed ileum. Patient was admitted to the hospital service for recurrent small bowel  obstruction.  Hospital Course:  Principal Problem:   SBO (small bowel obstruction): NG tube is placed on 2/28 which improved her symptoms. Gastroenterology was consulted as well abdominal distention decreased the patient's nausea and vomiting improved. By 3/1 gastroenterology have been following and her NG tube was clamped and started on sips of clears. I was able to be advanced to full liquids the following day and by the evening of 3/3, patient was starting to tolerate solid food. She will be discharged on 3/4.  Active Problems:   HYPOTHYROIDISM: Patient was put on IV Synthroid during her hospitalization and when she can resume by mouth was changed back to her by mouth dose. Her TSH was checked and found to be 9.3, however her thyroid medications currently being adjusted by her primary doctor and so will defer this for outpatient for further correction.    HYPERLIPIDEMIA: Stable    HYPERTENSION: Controlled. Patient will resume her home meds.   Hx of bladder cancer: Stable.     On total parenteral nutrition (TPN): Stable. Prior to discharge, she'll have blood work done TPN preparation at home.    Recurrent UTI: Culture noted multiple bacteria nonproductive. Antibiotics which initially started on admission were discontinued.    Microcytic anemia   Hyponatremia   Procedures: None Consultations:  Dr. Kendell Bane- gastroenterology  Discharge Exam: Filed Vitals:   08/31/12 0525 08/31/12 1502 09/01/12 0309 09/01/12 0440  BP: 95/61 112/79 115/74 117/77  Pulse: 84 75 84 91  Temp: 98 F (36.7 C) 98.2 F (36.8 C) 98.2 F (36.8 C) 98.3 F (36.8 C)  TempSrc: Oral  Oral Oral  Resp: 20 20 20 20   Height:      Weight: 74.8 kg (164 lb 14.5 oz)   72.3 kg (159 lb 6.3 oz)  SpO2: 94% 97% 96% 97%    General: Alert and oriented, no complaints, tolerating solid food Cardiovascular: Regular rate and rhythm, S1-S2 Respiratory: Clear to auscultation bilaterally Abdomen: Soft, nontender,  nondistended, some bowel sounds, ostomy intact Extremities: No clubbing or cyanosis, trace pitting edema  Discharge Instructions  Discharge Orders   Future Appointments [facility] Department Dept Phone   10/22/2012 2:30 PM Salley Scarlet, MD [facility] [phone]   Future Orders Complete By Expires     Diet - low sodium heart healthy  As directed     Increase activity slowly  As directed         Medication List    TAKE these medications       dexlansoprazole 60 MG capsule  Commonly known as:  DEXILANT  Take 60 mg by mouth daily. TAKE 1 CAPSULE BY MOUTH DAILY     dextrose 5 % SOLN 50 mL with ceFEPIme 1 G SOLR 1 g  Inject 1 g into the vein every 12 (twelve) hours.     docusate sodium 100 MG capsule  Commonly known as:  COLACE  Take 100 mg by mouth 2 (two) times daily.     levothyroxine 88 MCG tablet  Commonly known as:  SYNTHROID, LEVOTHROID  Take 1 tablet (88 mcg total) by mouth daily.     multivitamin with minerals Tabs  Take 1 tablet by mouth every other day. On Tuesdays, Thursdays, Saturdays, and Sundays. **Administered at bedtime with TPN administration     polyethylene glycol powder powder  Commonly known as:  GLYCOLAX/MIRALAX  Take 17 g by mouth daily. MIX 1 CAPFUL IN LIQUID AS DIRECTED AND TAKE DAILY     promethazine 12.5 MG tablet  Commonly known as:  PHENERGAN  Take 1 tablet (12.5 mg total) by mouth every 8 (eight) hours as needed for nausea.     senna-docusate 8.6-50 MG per tablet  Commonly known as:  Senokot-S  Take 1 tablet by mouth 2 (two) times daily.     TPN NICU  Inject into the vein continuous. Formula copy place in paper chart.   Lipids daily due to use of 3 in 1 bag.       VICODIN PO  Take 1 tablet by mouth every 6 (six) hours as needed (for pain).           Follow-up Information   Follow up with Milinda Antis, MD. Schedule an appointment as soon as possible for a visit in 1 month.   Contact information:   [address], [address] [place] [address] [phone]        The results of significant diagnostics from this hospitalization (including imaging, microbiology, ancillary and laboratory) are listed below for reference.    Significant Diagnostic Studies: Ct Abdomen Pelvis W Contrast  08/25/2012    IMPRESSION: Small bowel obstruction with diffuse distension of predominately fluid-filled small bowel loops and decompression of the ileum. Transition zone appears to be at the midline of the lower abdomen and an area of surgical scarring suggesting adhesions.  Appearance is similar to previous study.   Original Report Authenticated By: Burman Nieves, M.D.    Dg Abd Acute W/chest  08/28/2012   IMPRESSION: Persistent partial small bowel obstruction.  Contrast has passed from the small bowel into  the colon.   Original Report Authenticated By: Francene Boyers, M.D.     Microbiology: Recent Results (from the past 240 hour(s))  URINE CULTURE     Status: None   Collection Time    08/25/12 11:20 PM      Result Value Range Status   Specimen Description URINE, CLEAN CATCH   Final   Special Requests NONE   Final   Culture  Setup Time 08/26/2012 15:34   Final   Colony Count 80,000 COLONIES/ML   Final   Culture     Final   Value: Multiple bacterial morphotypes present, none predominant. Suggest appropriate recollection if clinically indicated.   Report Status 08/26/2012 FINAL   Final  MRSA PCR SCREENING     Status: None   Collection Time    08/28/12  8:01 AM      Result Value Range Status   MRSA by PCR NEGATIVE  NEGATIVE Final   Comment:            The GeneXpert MRSA Assay (FDA     approved for NASAL specimens     only), is one component of a     comprehensive MRSA colonization     surveillance program. It is not     intended to diagnose MRSA     infection nor to guide or     monitor treatment for     MRSA infections.  CLOSTRIDIUM DIFFICILE BY PCR     Status: None   Collection Time    08/30/12  10:41 PM      Result Value Range Status   C difficile by pcr NEGATIVE  NEGATIVE Final     Labs: Basic Metabolic Panel:  Recent Labs Lab 08/27/12 0600 08/28/12 0521 08/29/12 0556 08/30/12 0446 08/31/12 0629  NA 135 134* 134* 134* 133*  K 4.7 4.5 4.5 4.8 4.6  CL 105 103 103 102 103  CO2 23 22 23 22  18*  GLUCOSE 132* 131* 118* 131* 148*  BUN 25* 19 17 19 22   CREATININE 0.89 0.97 0.91 0.94 0.89  CALCIUM 8.4 8.8 8.7 9.1 9.4  MG 2.2  --   --   --  1.4*  PHOS 4.0  --   --   --  4.1   Liver Function Tests:  Recent Labs Lab 08/25/12 2034 08/27/12 0600 08/31/12 0629  AST 17 23 33  ALT 13 20 23   ALKPHOS 179* 181* 197*  BILITOT 0.3 0.5 0.5  PROT 7.4 5.8* 7.5  ALBUMIN 3.5 2.6* 3.3*    Recent Labs Lab 08/25/12 2034  LIPASE 12   CBC:  Recent Labs Lab 08/27/12 0600 08/28/12 0521 08/29/12 0556 08/30/12 0446 08/31/12 0629  WBC 4.3 5.3 3.9* 8.0 7.1  NEUTROABS  --   --   --   --  5.2  HGB 10.5* 11.6* 11.1* 11.2* 12.6  HCT 29.6* 33.1* 31.5* 31.6* 35.1*  MCV 75.3* 75.2* 75.4* 75.4* 75.0*  PLT 331 353 334 280 378    Signed:  KRISHNAN,SENDIL K  [facility] 09/01/2012, 10:57 AM

## 2012-09-22 ENCOUNTER — Encounter: Payer: Self-pay | Admitting: Family Medicine

## 2012-09-22 ENCOUNTER — Ambulatory Visit (INDEPENDENT_AMBULATORY_CARE_PROVIDER_SITE_OTHER): Payer: Medicare Other | Admitting: Family Medicine

## 2012-09-22 VITALS — BP 122/70 | HR 88 | Resp 18 | Ht 63.0 in | Wt 157.1 lb

## 2012-09-22 DIAGNOSIS — E039 Hypothyroidism, unspecified: Secondary | ICD-10-CM

## 2012-09-22 DIAGNOSIS — E440 Moderate protein-calorie malnutrition: Secondary | ICD-10-CM

## 2012-09-22 DIAGNOSIS — N3000 Acute cystitis without hematuria: Secondary | ICD-10-CM

## 2012-09-22 DIAGNOSIS — I10 Essential (primary) hypertension: Secondary | ICD-10-CM

## 2012-09-22 DIAGNOSIS — N390 Urinary tract infection, site not specified: Secondary | ICD-10-CM

## 2012-09-22 DIAGNOSIS — K7689 Other specified diseases of liver: Secondary | ICD-10-CM

## 2012-09-22 DIAGNOSIS — K760 Fatty (change of) liver, not elsewhere classified: Secondary | ICD-10-CM

## 2012-09-22 DIAGNOSIS — R109 Unspecified abdominal pain: Secondary | ICD-10-CM

## 2012-09-22 DIAGNOSIS — G8929 Other chronic pain: Secondary | ICD-10-CM

## 2012-09-22 LAB — POCT URINALYSIS DIPSTICK
Glucose, UA: NEGATIVE
Spec Grav, UA: 1.02
Urobilinogen, UA: 0.2

## 2012-09-22 MED ORDER — CIPROFLOXACIN HCL 500 MG PO TABS: 500.0000 mg | ORAL_TABLET | Freq: Two times a day (BID) | ORAL | Status: DC

## 2012-09-22 NOTE — Patient Instructions (Addendum)
Start Cipro Continue all other medications Take all meds to [place]  Mammogram to be rescheduled F/U 3 months

## 2012-09-23 ENCOUNTER — Encounter: Payer: Self-pay | Admitting: Family Medicine

## 2012-09-23 DIAGNOSIS — G8929 Other chronic pain: Secondary | ICD-10-CM | POA: Insufficient documentation

## 2012-09-23 MED ORDER — HYDROCODONE-ACETAMINOPHEN 5-325 MG PO TABS: 1.0000 | ORAL_TABLET | ORAL | Status: DC | PRN

## 2012-09-23 NOTE — Assessment & Plan Note (Signed)
Refill Vicodin.

## 2012-09-23 NOTE — Assessment & Plan Note (Signed)
Overall maintaing weight with some po intake but largely supported by TPN

## 2012-09-23 NOTE — Assessment & Plan Note (Signed)
Will continue Cipro she has started, also given refill as she is going to [facility] for 3 weeks in case of emergent need

## 2012-09-23 NOTE — Assessment & Plan Note (Signed)
Will have her repeat thyroid labs

## 2012-09-23 NOTE — Assessment & Plan Note (Signed)
Well controlled, no meds needed

## 2012-09-23 NOTE — Assessment & Plan Note (Signed)
LFT look good, FLP looks good, continue to monitor

## 2012-09-23 NOTE — Progress Notes (Signed)
  Subjective:    Patient ID: Rozell Searing, female    DOB: 02-01-1951, 62 y.o.   MRN: [id number]  HPI  Pt here for UTI symptoms, was admitted a few weeks ago for recurrent SBO, soft stools, abd pain for past 5 days, initially she though SBO so stopped eating but this did not improve, No N/V. Noticed darkening to urine. Took Cipro x 2 doses and begin to feel better. No fever. Unable to afford virtual colonoscopy at this time No Mammogram  Review of Systems   GEN- denies fatigue, fever, weight loss,weakness, recent illness HEENT- denies eye drainage, change in vision, nasal discharge, CVS- denies chest pain, palpitations RESP- denies SOB, cough, wheeze ABD- denies N/V, change in stools, +abd pain GU- denies dysuria, hematuria, dribbling, incontinence MSK- denies joint pain, muscle aches, injury Neuro- denies headache, dizziness, syncope, seizure activity      Objective:   Physical Exam GEN- NAD, alert and oriented x3 HEENT-PERRL, EOMI, non icteric, MMM, oropharynx clear CVS- RRR, no murmur RESP-CTAB ABD- NABS, Mild TTP lower quadrants, ostomy in RLQ with cloudy urine bag ,bloated appearing, no rebound, no gaurding EXT- No edema Pulses- Radial 2+        Assessment & Plan:

## 2012-09-24 LAB — URINE CULTURE: Colony Count: 65000

## 2012-09-30 ENCOUNTER — Other Ambulatory Visit: Payer: Self-pay | Admitting: Family Medicine

## 2012-09-30 DIAGNOSIS — Z139 Encounter for screening, unspecified: Secondary | ICD-10-CM

## 2012-10-06 ENCOUNTER — Ambulatory Visit (HOSPITAL_COMMUNITY)
Admission: RE | Admit: 2012-10-06 | Discharge: 2012-10-06 | Disposition: A | Discharge status: Home / Self Care | Payer: Medicare Other | Source: Ambulatory Visit | Attending: Family Medicine | Admitting: Family Medicine

## 2012-10-06 DIAGNOSIS — Z1239 Encounter for other screening for malignant neoplasm of breast: Secondary | ICD-10-CM

## 2012-10-06 DIAGNOSIS — Z1231 Encounter for screening mammogram for malignant neoplasm of breast: Secondary | ICD-10-CM | POA: Insufficient documentation

## 2012-10-07 ENCOUNTER — Encounter (HOSPITAL_COMMUNITY): Payer: Self-pay

## 2012-10-07 ENCOUNTER — Inpatient Hospital Stay (HOSPITAL_COMMUNITY)
Admission: EM | Admit: 2012-10-07 | Discharge: 2012-10-13 | DRG: 389 | Disposition: A | Discharge status: Home Health | Payer: Medicare Other | Attending: General Surgery | Admitting: General Surgery

## 2012-10-07 ENCOUNTER — Emergency Department (HOSPITAL_COMMUNITY): Payer: Medicare Other

## 2012-10-07 DIAGNOSIS — Z8551 Personal history of malignant neoplasm of bladder: Secondary | ICD-10-CM

## 2012-10-07 DIAGNOSIS — M549 Dorsalgia, unspecified: Secondary | ICD-10-CM | POA: Diagnosis present

## 2012-10-07 DIAGNOSIS — Z882 Allergy status to sulfonamides status: Secondary | ICD-10-CM

## 2012-10-07 DIAGNOSIS — Z8249 Family history of ischemic heart disease and other diseases of the circulatory system: Secondary | ICD-10-CM

## 2012-10-07 DIAGNOSIS — Z79899 Other long term (current) drug therapy: Secondary | ICD-10-CM

## 2012-10-07 DIAGNOSIS — N390 Urinary tract infection, site not specified: Secondary | ICD-10-CM | POA: Diagnosis present

## 2012-10-07 DIAGNOSIS — Z87891 Personal history of nicotine dependence: Secondary | ICD-10-CM

## 2012-10-07 DIAGNOSIS — E039 Hypothyroidism, unspecified: Secondary | ICD-10-CM | POA: Diagnosis present

## 2012-10-07 DIAGNOSIS — I10 Essential (primary) hypertension: Secondary | ICD-10-CM | POA: Diagnosis present

## 2012-10-07 DIAGNOSIS — K560 Paralytic ileus: Secondary | ICD-10-CM | POA: Diagnosis present

## 2012-10-07 DIAGNOSIS — K56609 Unspecified intestinal obstruction, unspecified as to partial versus complete obstruction: Principal | ICD-10-CM | POA: Diagnosis present

## 2012-10-07 DIAGNOSIS — G8929 Other chronic pain: Secondary | ICD-10-CM | POA: Diagnosis present

## 2012-10-07 DIAGNOSIS — E785 Hyperlipidemia, unspecified: Secondary | ICD-10-CM | POA: Diagnosis present

## 2012-10-07 DIAGNOSIS — K219 Gastro-esophageal reflux disease without esophagitis: Secondary | ICD-10-CM | POA: Diagnosis present

## 2012-10-07 DIAGNOSIS — Z880 Allergy status to penicillin: Secondary | ICD-10-CM

## 2012-10-07 DIAGNOSIS — Z881 Allergy status to other antibiotic agents status: Secondary | ICD-10-CM

## 2012-10-07 DIAGNOSIS — Z923 Personal history of irradiation: Secondary | ICD-10-CM

## 2012-10-07 DIAGNOSIS — Z888 Allergy status to other drugs, medicaments and biological substances status: Secondary | ICD-10-CM

## 2012-10-07 DIAGNOSIS — K7689 Other specified diseases of liver: Secondary | ICD-10-CM | POA: Diagnosis present

## 2012-10-07 DIAGNOSIS — Z833 Family history of diabetes mellitus: Secondary | ICD-10-CM

## 2012-10-07 DIAGNOSIS — Z936 Other artificial openings of urinary tract status: Secondary | ICD-10-CM

## 2012-10-07 DIAGNOSIS — Z9221 Personal history of antineoplastic chemotherapy: Secondary | ICD-10-CM

## 2012-10-07 DIAGNOSIS — Z886 Allergy status to analgesic agent status: Secondary | ICD-10-CM

## 2012-10-07 LAB — URINALYSIS, ROUTINE W REFLEX MICROSCOPIC
Bilirubin Urine: NEGATIVE
Ketones, ur: NEGATIVE mg/dL
Nitrite: NEGATIVE
Urobilinogen, UA: 0.2 mg/dL (ref 0.0–1.0)

## 2012-10-07 LAB — COMPREHENSIVE METABOLIC PANEL
ALT: 11 U/L (ref 0–35)
AST: 17 U/L (ref 0–37)
Alkaline Phosphatase: 188 U/L — ABNORMAL HIGH (ref 39–117)
CO2: 25 mEq/L (ref 19–32)
Chloride: 102 mEq/L (ref 96–112)
GFR calc Af Amer: 73 mL/min — ABNORMAL LOW (ref >90)
GFR calc non Af Amer: 63 mL/min — ABNORMAL LOW (ref >90)
Glucose, Bld: 80 mg/dL (ref 70–99)
Potassium: 4.2 mEq/L (ref 3.5–5.1)
Sodium: 136 mEq/L (ref 135–145)

## 2012-10-07 LAB — CBC WITH DIFFERENTIAL/PLATELET
Lymphocytes Relative: 23 % (ref 12–46)
Lymphs Abs: 1.2 10*3/uL (ref 0.7–4.0)
MCV: 74.2 fL — ABNORMAL LOW (ref 78.0–100.0)
Neutro Abs: 3.4 10*3/uL (ref 1.7–7.7)
Neutrophils Relative %: 67 % (ref 43–77)
Platelets: 328 10*3/uL (ref 150–400)
RBC: 4.65 MIL/uL (ref 3.87–5.11)
WBC: 5.1 10*3/uL (ref 4.0–10.5)

## 2012-10-07 LAB — URINE MICROSCOPIC-ADD ON

## 2012-10-07 MED ORDER — HYDROMORPHONE HCL PF 1 MG/ML IJ SOLN
1.0000 mg | Freq: Once | INTRAMUSCULAR | Status: AC
  Administered 2012-10-07: 1 mg via INTRAVENOUS
  Filled 2012-10-07: qty 1

## 2012-10-07 MED ORDER — IOHEXOL 300 MG/ML  SOLN
50.0000 mL | Freq: Once | INTRAMUSCULAR | Status: AC | PRN
  Administered 2012-10-07: 50 mL via ORAL

## 2012-10-07 MED ORDER — ONDANSETRON HCL 4 MG/2ML IJ SOLN
4.0000 mg | Freq: Once | INTRAMUSCULAR | Status: AC
  Administered 2012-10-07: 4 mg via INTRAVENOUS
  Filled 2012-10-07: qty 2

## 2012-10-07 MED ORDER — PANTOPRAZOLE SODIUM 40 MG IV SOLR
40.0000 mg | Freq: Every day | INTRAVENOUS | Status: DC
  Administered 2012-10-07 – 2012-10-10 (×4): 40 mg via INTRAVENOUS
  Filled 2012-10-07 (×4): qty 40

## 2012-10-07 MED ORDER — ONDANSETRON HCL 4 MG/2ML IJ SOLN
4.0000 mg | Freq: Four times a day (QID) | INTRAMUSCULAR | Status: DC | PRN
  Administered 2012-10-07 – 2012-10-12 (×3): 4 mg via INTRAVENOUS
  Filled 2012-10-07 (×3): qty 2

## 2012-10-07 MED ORDER — SODIUM CHLORIDE 0.9 % IV SOLN
Freq: Once | INTRAVENOUS | Status: AC
  Administered 2012-10-07: 19:00:00 via INTRAVENOUS

## 2012-10-07 MED ORDER — LACTATED RINGERS IV SOLN
INTRAVENOUS | Status: DC
  Administered 2012-10-07 – 2012-10-11 (×10): via INTRAVENOUS
  Administered 2012-10-12: 1 mL via INTRAVENOUS
  Administered 2012-10-12: 09:00:00 via INTRAVENOUS

## 2012-10-07 MED ORDER — IOHEXOL 300 MG/ML  SOLN
100.0000 mL | Freq: Once | INTRAMUSCULAR | Status: AC | PRN
  Administered 2012-10-07: 100 mL via INTRAVENOUS

## 2012-10-07 MED ORDER — ENOXAPARIN SODIUM 40 MG/0.4ML ~~LOC~~ SOLN
40.0000 mg | SUBCUTANEOUS | Status: DC
  Administered 2012-10-07 – 2012-10-11 (×5): 40 mg via SUBCUTANEOUS
  Filled 2012-10-07 (×5): qty 0.4

## 2012-10-07 NOTE — ED Notes (Signed)
Patient states that she started having mid abdominal pain on Sunday, states that she started vomiting on Monday.  States that she has had an obstruction in the past.  States that she noticed blood in her urine yesterday

## 2012-10-07 NOTE — ED Notes (Signed)
Complain of abdominal pain and n/v

## 2012-10-07 NOTE — ED Provider Notes (Signed)
History    This chart was scribed for Charlene Lennert, MD by Marlyne Beards, ED Scribe. The patient was seen in room APA04/APA04. Patient's care was started at 6:13 PM.    CSN: [id number]  Arrival date & time 10/07/12  1642   First MD Initiated Contact with Patient 10/07/12 1813      Chief Complaint  Patient presents with  . Abdominal Pain    (Consider location/radiation/quality/duration/timing/severity/associated sxs/prior treatment) Patient is a 62 y.o. female presenting with abdominal pain. The history is provided by the patient and a relative. No language interpreter was used.  Abdominal Pain Pain location:  Generalized Pain severity:  Moderate Onset quality:  Gradual Timing:  Constant Progression:  Worsening Chronicity:  Recurrent Relieved by:  Nothing Associated symptoms: hematuria   Associated symptoms: no chest pain, no cough, no diarrhea and no fatigue    [name] is a 62 y.o. female with h/o cancer, HTN, and UTI's who presents to the Emergency Department complaining of moderate constant abdominal pain onset Saturday night. Pt states that she is having abdominal pain in her epigastric area. Pt states she has been taking hydrocodone for the pain with no immediate relief. Pt states she has not eaten since Saturday and had an episode of emesis today with associate hematuria. Pt states she thinks she might have a UTI and took some antibiotics for the sx's. Pt states she was here last month for a bowel obstruction. Pt denies fever, chills, cough, nausea, vomiting, diarrhea, SOB, weakness, and any other associated symptoms. Pt has medical hx of bowel obstruction. Pt's PCP is Dr. [name].  Past Medical History  Diagnosis Date  . Bowel obstruction several    Recurrent SBO secondary to adhesions  . Hypothyroidism   . Acid reflux   . Chronic back pain   . Hyperlipidemia   . Hypertension     pt says taken off medication since has lost weight.  . Cancer 2011 [facility])     diagnosed in 2009 per pt. Invasive High grade Urothelial carcinoma- s/p radiation and Chemo   . UTI (lower urinary tract infection)     Recurrent  . Malnutrition   . Leukopenia 12/06/2011    HIV serology negative  . Fatty liver     Past Surgical History  Procedure Laterality Date  . Back surgery      X2  . Hernia repair      mesh  . Abdominal surgery      with intestinal "puncture" x 2, exploratory surgery   . Cholecystectomy    . Ileo conduit      For bladder cancer  . Cystectomy      breast  . Portacath placement    . Bladder removal      Family History  Problem Relation Age of Onset  . Heart disease Mother     enlarged  . Hypertension Mother   . Diabetes Father   . Diabetes Sister   . Diabetes Maternal Grandmother   . Colon cancer Neg Hx     History  Substance Use Topics  . Smoking status: Former Smoker    Quit date: 09/26/2010  . Smokeless tobacco: Not on file  . Alcohol Use: No    OB History   Grav Para Term Preterm Abortions TAB SAB Ect Mult Living                  Review of Systems  Constitutional: Negative for fatigue.  HENT: Negative for  congestion, sinus pressure and ear discharge.   Eyes: Negative for discharge.  Respiratory: Negative for cough.   Cardiovascular: Negative for chest pain.  Gastrointestinal: Positive for abdominal pain. Negative for diarrhea.  Genitourinary: Positive for hematuria. Negative for frequency.  Musculoskeletal: Negative for back pain.  Skin: Negative for rash.  Neurological: Negative for seizures and headaches.  Psychiatric/Behavioral: Negative for hallucinations.    Allergies  Bactrim; Codeine; Nitrofuran derivatives; Penicillins; and Sulfa antibiotics  Home Medications   Current Outpatient Rx  Name  Route  Sig  Dispense  Refill  . ciprofloxacin (CIPRO) 500 MG tablet   Oral   Take 1 tablet (500 mg total) by mouth 2 (two) times daily.   14 tablet   1   . dexlansoprazole (DEXILANT) 60 MG capsule    Oral   Take 60 mg by mouth daily. TAKE 1 CAPSULE BY MOUTH DAILY         . docusate sodium (COLACE) 100 MG capsule   Oral   Take 100 mg by mouth 2 (two) times daily.          Marland Kitchen HYDROcodone-acetaminophen (NORCO) 5-325 MG per tablet   Oral   Take 1 tablet by mouth every 4 (four) hours as needed for pain.   45 tablet   2   . levothyroxine (SYNTHROID, LEVOTHROID) 88 MCG tablet   Oral   Take 1 tablet (88 mcg total) by mouth daily.   30 tablet   3     Increase dose   . Multiple Vitamin (MULITIVITAMIN WITH MINERALS) TABS   Oral   Take 1 tablet by mouth every other day. On Tuesdays, Thursdays, Saturdays, and Sundays. **Administered at bedtime with TPN administration         . polyethylene glycol powder (GLYCOLAX/MIRALAX) powder   Oral   Take 17 g by mouth daily. MIX 1 CAPFUL IN LIQUID AS DIRECTED AND TAKE DAILY         . promethazine (PHENERGAN) 12.5 MG tablet   Oral   Take 1 tablet (12.5 mg total) by mouth every 8 (eight) hours as needed for nausea.   10 tablet   0   . senna-docusate (SENOKOT-S) 8.6-50 MG per tablet   Oral   Take 1 tablet by mouth 2 (two) times daily.          . TPN NICU   Intravenous   Inject into the vein continuous. Formula copy place in paper chart.   Lipids daily due to use of 3 in 1 bag.            BP 118/71  Pulse 67  Temp(Src) 97.7 F (36.5 C) (Oral)  Resp 16  Ht 5' 5.5" (1.664 m)  Wt 160 lb (72.576 kg)  BMI 26.21 kg/m2  SpO2 100%  Physical Exam  Nursing note and vitals reviewed. Constitutional: She is oriented to person, place, and time. She appears well-developed.  HENT:  Head: Normocephalic.  Eyes: Conjunctivae and EOM are normal. No scleral icterus.  Neck: Neck supple. No thyromegaly present.  Cardiovascular: Normal rate and regular rhythm.  Exam reveals no gallop and no friction rub.   No murmur heard. Pulmonary/Chest: No stridor. She has no wheezes. She has no rales. She exhibits no tenderness.  Abdominal: She  exhibits distension. There is tenderness. There is no rebound.  Genitourinary:  Pt has urostomy bag   Musculoskeletal: Normal range of motion. She exhibits no edema.  Lymphadenopathy:    She has no cervical adenopathy.  Neurological: She  is oriented to person, place, and time. Coordination normal.  Skin: No rash noted. No erythema.  Psychiatric: She has a normal mood and affect. Her behavior is normal.    ED Course  Procedures (including critical care time) DIAGNOSTIC STUDIES: Oxygen Saturation is 100% on room air, normal by my interpretation.    COORDINATION OF CARE: 6:29 PM Discussed ED treatment with pt and pt agrees.  9:46 PM Discussed labs and xray results (UTI and bowel obstruction) with pt and pt agrees.    Labs Reviewed  CBC WITH DIFFERENTIAL - Abnormal; Notable for the following:    HCT 34.5 (*)    MCV 74.2 (*)    All other components within normal limits  COMPREHENSIVE METABOLIC PANEL - Abnormal; Notable for the following:    BUN 25 (*)    Albumin 3.4 (*)    Alkaline Phosphatase 188 (*)    GFR calc non Af Amer 63 (*)    GFR calc Af Amer 73 (*)    All other components within normal limits  URINALYSIS, ROUTINE W REFLEX MICROSCOPIC - Abnormal; Notable for the following:    APPearance CLOUDY (*)    pH 8.5 (*)    Hgb urine dipstick MODERATE (*)    Protein, ur 30 (*)    Leukocytes, UA MODERATE (*)    All other components within normal limits  URINE MICROSCOPIC-ADD ON - Abnormal; Notable for the following:    Squamous Epithelial / LPF FEW (*)    Bacteria, UA MANY (*)    All other components within normal limits  URINE CULTURE   Ct Abdomen Pelvis W Contrast  10/07/2012  *RADIOLOGY REPORT*  Clinical Data: Abdominal pain.  CT ABDOMEN AND PELVIS WITH CONTRAST  Technique:  Multidetector CT imaging of the abdomen and pelvis was performed following the standard protocol during bolus administration of intravenous contrast.  Contrast: 100 mL of Omnipaque 300 IV contrast   Comparison: CT of the abdomen and pelvis performed 08/25/2012  Findings: Mild bibasilar atelectasis is noted.  There is recurrent marked distension of proximal small bowel loops, measuring up to 7.6 cm in diameter.  This progresses to the level of the upper pelvis, with gradual decompression at the proximal to mid ileum, and a likely transition point at the previously noted area of scarring, dystrophic calcification and fluid along the lower anterior abdominal wall.  The appearance of the scarring is grossly unchanged from the prior study, with surrounding postoperative change.  This most likely reflects underlying adhesions.  More distal small bowel is decompressed.  A small amount of residual fluid and air is seen within the colon.  Mild presacral stranding is grossly stable and may reflect the patient's baseline.  A small amount of free fluid is again noted within the pelvis.  There is diffuse dilatation of the intrahepatic biliary ducts, perhaps slightly improved from the prior study.  This may remain within normal limits status post cholecystectomy.  A stable vague 2.2 cm hypodensity within the right hepatic lobe likely reflects a cyst.  Additional small hypodensities in the liver are nonspecific but may reflect small cysts.  The spleen is grossly unremarkable in appearance.  The pancreas and adrenal glands are within normal limits.  Mild scattered hypodensities in the periphery of both kidneys likely reflect small cysts.  There is no evidence of hydronephrosis.  No renal or ureteral stones are identified.  No significant perinephric stranding is seen.  The stomach is filled with contrast and is within normal limits. No  acute vascular abnormalities are seen.  Relatively diffuse calcification is noted along the abdominal aorta and its branches.  The appendix is not definitely characterized; there is no evidence for appendicitis.  As described above, the colon is grossly unremarkable in appearance.  The patient  has an ileal conduit, with an associated ostomy at the right lower quadrant; this is not well assessed but appears grossly unremarkable.  The patient is status post hysterectomy.  No suspicious adnexal masses are seen.  No inguinal lymphadenopathy is seen.  No acute osseous abnormalities are identified.  There is partial osseous fusion of L5 and S1.  IMPRESSION:  1.  Recurrent acute small bowel obstruction, with marked distension of proximal small bowel loops to 7.6 cm in maximal diameter. Gradual decompression noted at the proximal to mid ileum, with a likely transition point noted slightly more distally, at the previously seen area of scarring, dystrophic calcification and fluid along the lower anterior abdominal wall.  This likely reflects underlying adhesions.  On the prior study, a similar appearance was eventually thought to reflect partial small bowel obstruction, given a follow-up abdominal radiograph that showed contrast progression into the colon.  Follow-up abdominal radiograph could also be considered in this case, to assess whether contrast progresses into the colon. 2.  Stable appearance to mild presacral stranding and small amount of free fluid within the pelvis. 3.  Likely hepatic and tiny bilateral renal cysts noted. 4.  Relatively diffuse calcification along the abdominal aorta and its branches. 5.  Ileal conduit grossly unremarkable in appearance, though difficult to fully assess.  These results were called by telephone on 10/07/2012 at 09:40 p.m. to Dr. Bethann Berkshire, who verbally acknowledged these results.   Original Report Authenticated By: Tonia Ghent, M.D.      No diagnosis found.    MDM   The chart was scribed for me under my direct supervision.  I personally performed the history, physical, and medical decision making and all procedures in the evaluation of this patient.Charlene Lennert, MD 10/07/12 2153

## 2012-10-08 ENCOUNTER — Ambulatory Visit: Payer: Medicare Other | Admitting: Family Medicine

## 2012-10-08 ENCOUNTER — Encounter (HOSPITAL_COMMUNITY): Payer: Self-pay | Admitting: *Deleted

## 2012-10-08 LAB — CBC
MCV: 73.3 fL — ABNORMAL LOW (ref 78.0–100.0)
Platelets: 276 10*3/uL (ref 150–400)
RBC: 4.01 MIL/uL (ref 3.87–5.11)
RDW: 15 % (ref 11.5–15.5)
WBC: 3.6 10*3/uL — ABNORMAL LOW (ref 4.0–10.5)

## 2012-10-08 LAB — COMPREHENSIVE METABOLIC PANEL
Albumin: 2.9 g/dL — ABNORMAL LOW (ref 3.5–5.2)
Alkaline Phosphatase: 175 U/L — ABNORMAL HIGH (ref 39–117)
BUN: 20 mg/dL (ref 6–23)
Chloride: 103 mEq/L (ref 96–112)
Creatinine, Ser: 1.05 mg/dL (ref 0.50–1.10)
GFR calc Af Amer: 65 mL/min — ABNORMAL LOW (ref >90)
Glucose, Bld: 71 mg/dL (ref 70–99)
Potassium: 3.9 mEq/L (ref 3.5–5.1)
Total Bilirubin: 0.3 mg/dL (ref 0.3–1.2)

## 2012-10-08 MED ORDER — POLYETHYLENE GLYCOL 3350 17 G PO PACK
17.0000 g | PACK | Freq: Every day | ORAL | Status: DC
  Administered 2012-10-09 – 2012-10-13 (×5): 17 g via ORAL
  Filled 2012-10-08 (×5): qty 1

## 2012-10-08 MED ORDER — POLYETHYLENE GLYCOL 3350 17 GM/SCOOP PO POWD: 17.0000 g | Freq: Every day | ORAL | Status: DC

## 2012-10-08 MED ORDER — DIPHENHYDRAMINE HCL 50 MG/ML IJ SOLN
25.0000 mg | Freq: Four times a day (QID) | INTRAMUSCULAR | Status: DC | PRN
  Administered 2012-10-08 – 2012-10-11 (×3): 25 mg via INTRAVENOUS
  Filled 2012-10-08 (×3): qty 1

## 2012-10-08 MED ORDER — ADULT MULTIVITAMIN W/MINERALS CH
1.0000 | ORAL_TABLET | Freq: Once | ORAL | Status: AC
  Administered 2012-10-08: 1 via ORAL
  Filled 2012-10-08: qty 1

## 2012-10-08 MED ORDER — LEVOFLOXACIN IN D5W 500 MG/100ML IV SOLN
500.0000 mg | INTRAVENOUS | Status: DC
  Administered 2012-10-08 – 2012-10-10 (×3): 500 mg via INTRAVENOUS
  Filled 2012-10-08 (×4): qty 100

## 2012-10-08 MED ORDER — HYDROMORPHONE HCL PF 1 MG/ML IJ SOLN
1.0000 mg | INTRAMUSCULAR | Status: DC | PRN
  Administered 2012-10-08 – 2012-10-12 (×10): 1 mg via INTRAVENOUS
  Filled 2012-10-08 (×10): qty 1

## 2012-10-08 MED ORDER — DEXTROSE 70 % IV SOLN
INTRAVENOUS | Status: AC
  Administered 2012-10-09: via INTRAVENOUS

## 2012-10-08 MED ORDER — DOCUSATE SODIUM 100 MG PO CAPS
100.0000 mg | ORAL_CAPSULE | Freq: Two times a day (BID) | ORAL | Status: DC
  Administered 2012-10-09 – 2012-10-13 (×10): 100 mg via ORAL
  Filled 2012-10-08 (×10): qty 1

## 2012-10-08 NOTE — Care Management Note (Signed)
    Page 1 of 1   10/13/2012     10:26:28 AM   CARE MANAGEMENT NOTE 10/13/2012  Patient:  [name], [name]   Account Number:  [id number]  Date Initiated:  10/08/2012  Documentation initiated by:  Sharrie Rothman  Subjective/Objective Assessment:   Pt admitted from home with SBO. Pt lives with her daughter and will return home at discharge. Pt is independent with ADL's. Pt does have continuous TPN with [facility] [place].     Action/Plan:   Will contact [facility] [place] to notify them of pts admission. Will continue to monitor for any discharge needs.   Anticipated DC Date:  10/12/2012   Anticipated DC Plan:  HOME/SELF CARE      DC Planning Services  CM consult      Choice offered to / List presented to:          [facility] arranged  [facility]      Status of service:  Completed, signed off Medicare Important Message given?  YES (If response is "NO", the following Medicare IM given date fields will be blank) Date Medicare IM given:  10/09/2012 Date Additional Medicare IM given:  10/13/2012  Discharge Disposition:  HOME/SELF CARE  Per UR Regulation:    If discussed at Long Length of Stay Meetings, dates discussed:   10/13/2012    Comments:  10/13/12 1020 Arlyss Queen, RN BSN CM Pt discharged home today. Tresa Endo with [facility] is aware of discharge today. Pt had already notified [facility] of discharge. Pt has no other CM needs.  10/08/12 1111 Arlyss Queen, RN BSN CM

## 2012-10-08 NOTE — H&P (Signed)
[name] is an 61 y.o. female.   Chief Complaint: Abdominal pain.  Nausea HPI: Patient presented to [facility] with increasing diffuse abdominal pain and nausea.  Similar symptoms in the past with SBO.  +flatus. Last BM yesterday.  No melena or hematochezia.  NO fever or chills.  Some odor with urine.  Appetite down from base line.  Patient is on chronic nocturnal TPN.  Typical diet is only soft diet.   Past Medical History  Diagnosis Date  . Bowel obstruction several    Recurrent SBO secondary to adhesions  . Hypothyroidism   . Acid reflux   . Chronic back pain   . Hyperlipidemia   . Hypertension     pt says taken off medication since has lost weight.  . Cancer 2011 [facility])    diagnosed in 2009 per pt. Invasive High grade Urothelial carcinoma- s/p radiation and Chemo   . UTI (lower urinary tract infection)     Recurrent  . Malnutrition   . Leukopenia 12/06/2011    HIV serology negative  . Fatty liver     Past Surgical History  Procedure Laterality Date  . Back surgery      X2  . Hernia repair      mesh  . Abdominal surgery      with intestinal "puncture" x 2, exploratory surgery   . Cholecystectomy    . Ileo conduit      For bladder cancer  . Cystectomy      breast  . Portacath placement    . Bladder removal      Family History  Problem Relation Age of Onset  . Heart disease Mother     enlarged  . Hypertension Mother   . Diabetes Father   . Diabetes Sister   . Diabetes Maternal Grandmother   . Colon cancer Neg Hx    Social History:  reports that she quit smoking about 2 years ago. She does not have any smokeless tobacco history on file. She reports that she does not drink alcohol or use illicit drugs.  Allergies:  Allergies  Allergen Reactions  . Bactrim (Sulfamethoxazole W-Trimethoprim)   . Codeine Hives  . Nitrofuran Derivatives Itching  . Penicillins Hives  . Sulfa Antibiotics Hives    Medications Prior to Admission  Medication Sig Dispense  Refill  . dexlansoprazole (DEXILANT) 60 MG capsule Take 60 mg by mouth daily.      Marland Kitchen docusate sodium (COLACE) 100 MG capsule Take 100 mg by mouth 2 (two) times daily.       Marland Kitchen HYDROcodone-acetaminophen (NORCO) 5-325 MG per tablet Take 1 tablet by mouth every 4 (four) hours as needed for pain.  45 tablet  2  . HYDROmorphone (DILAUDID) 2 MG tablet Take 2 mg by mouth every 6 (six) hours as needed for pain.      Marland Kitchen levothyroxine (SYNTHROID, LEVOTHROID) 88 MCG tablet Take 1 tablet (88 mcg total) by mouth daily.  30 tablet  3  . Multiple Vitamin (MULITIVITAMIN WITH MINERALS) TABS Take 1 tablet by mouth every other day. On Tuesdays, Thursdays, Saturdays, and Sundays. **Administered at bedtime with TPN administration      . polyethylene glycol powder (GLYCOLAX/MIRALAX) powder Take 17 g by mouth daily.      . TPN NICU Inject into the vein continuous. Formula copy place in paper chart.   Lipids daily due to use of 3 in 1 bag.      . promethazine (PHENERGAN) 12.5 MG tablet Take  1 tablet (12.5 mg total) by mouth every 8 (eight) hours as needed for nausea.  10 tablet  0    Results for orders placed during the hospital encounter of 10/07/12 (from the past 48 hour(s))  CBC WITH DIFFERENTIAL     Status: Abnormal   Collection Time    10/07/12  6:20 PM      Result Value Range   WBC 5.1  4.0 - 10.5 K/uL   RBC 4.65  3.87 - 5.11 MIL/uL   Hemoglobin 12.4  12.0 - 15.0 g/dL   HCT 81.1 (*) 91.4 - 78.2 %   MCV 74.2 (*) 78.0 - 100.0 fL   MCH 26.7  26.0 - 34.0 pg   MCHC 35.9  30.0 - 36.0 g/dL   RDW 95.6  21.3 - 08.6 %   Platelets 328  150 - 400 K/uL   Neutrophils Relative 67  43 - 77 %   Neutro Abs 3.4  1.7 - 7.7 K/uL   Lymphocytes Relative 23  12 - 46 %   Lymphs Abs 1.2  0.7 - 4.0 K/uL   Monocytes Relative 9  3 - 12 %   Monocytes Absolute 0.5  0.1 - 1.0 K/uL   Eosinophils Relative 1  0 - 5 %   Eosinophils Absolute 0.1  0.0 - 0.7 K/uL   Basophils Relative 0  0 - 1 %   Basophils Absolute 0.0  0.0 - 0.1 K/uL   COMPREHENSIVE METABOLIC PANEL     Status: Abnormal   Collection Time    10/07/12  6:20 PM      Result Value Range   Sodium 136  135 - 145 mEq/L   Potassium 4.2  3.5 - 5.1 mEq/L   Chloride 102  96 - 112 mEq/L   CO2 25  19 - 32 mEq/L   Glucose, Bld 80  70 - 99 mg/dL   BUN 25 (*) 6 - 23 mg/dL   Creatinine, Ser 5.78  0.50 - 1.10 mg/dL   Calcium 9.1  8.4 - 46.9 mg/dL   Total Protein 7.1  6.0 - 8.3 g/dL   Albumin 3.4 (*) 3.5 - 5.2 g/dL   AST 17  0 - 37 U/L   ALT 11  0 - 35 U/L   Alkaline Phosphatase 188 (*) 39 - 117 U/L   Total Bilirubin 0.4  0.3 - 1.2 mg/dL   GFR calc non Af Amer 63 (*) >90 mL/min   GFR calc Af Amer 73 (*) >90 mL/min   Comment:            The eGFR has been calculated     using the CKD EPI equation.     This calculation has not been     validated in all clinical     situations.     eGFR's persistently     <90 mL/min signify     possible Chronic Kidney Disease.  URINALYSIS, ROUTINE W REFLEX MICROSCOPIC     Status: Abnormal   Collection Time    10/07/12  7:10 PM      Result Value Range   Color, Urine YELLOW  YELLOW   APPearance CLOUDY (*) CLEAR   Specific Gravity, Urine 1.020  1.005 - 1.030   pH 8.5 (*) 5.0 - 8.0   Glucose, UA NEGATIVE  NEGATIVE mg/dL   Hgb urine dipstick MODERATE (*) NEGATIVE   Bilirubin Urine NEGATIVE  NEGATIVE   Ketones, ur NEGATIVE  NEGATIVE mg/dL   Protein, ur 30 (*)  NEGATIVE mg/dL   Urobilinogen, UA 0.2  0.0 - 1.0 mg/dL   Nitrite NEGATIVE  NEGATIVE   Leukocytes, UA MODERATE (*) NEGATIVE  URINE MICROSCOPIC-ADD ON     Status: Abnormal   Collection Time    10/07/12  7:10 PM      Result Value Range   Squamous Epithelial / LPF FEW (*) RARE   WBC, UA 21-50  <3 WBC/hpf   RBC / HPF 7-10  <3 RBC/hpf   Bacteria, UA MANY (*) RARE   Urine-Other YEAST    COMPREHENSIVE METABOLIC PANEL     Status: Abnormal   Collection Time    10/08/12  5:30 AM      Result Value Range   Sodium 136  135 - 145 mEq/L   Potassium 3.9  3.5 - 5.1 mEq/L    Chloride 103  96 - 112 mEq/L   CO2 23  19 - 32 mEq/L   Glucose, Bld 71  70 - 99 mg/dL   BUN 20  6 - 23 mg/dL   Creatinine, Ser 1.61  0.50 - 1.10 mg/dL   Calcium 8.8  8.4 - 09.6 mg/dL   Total Protein 6.0  6.0 - 8.3 g/dL   Albumin 2.9 (*) 3.5 - 5.2 g/dL   AST 16  0 - 37 U/L   ALT 9  0 - 35 U/L   Alkaline Phosphatase 175 (*) 39 - 117 U/L   Total Bilirubin 0.3  0.3 - 1.2 mg/dL   GFR calc non Af Amer 56 (*) >90 mL/min   GFR calc Af Amer 65 (*) >90 mL/min   Comment:            The eGFR has been calculated     using the CKD EPI equation.     This calculation has not been     validated in all clinical     situations.     eGFR's persistently     <90 mL/min signify     possible Chronic Kidney Disease.  CBC     Status: Abnormal   Collection Time    10/08/12  5:30 AM      Result Value Range   WBC 3.6 (*) 4.0 - 10.5 K/uL   RBC 4.01  3.87 - 5.11 MIL/uL   Hemoglobin 10.6 (*) 12.0 - 15.0 g/dL   HCT 04.5 (*) 40.9 - 81.1 %   MCV 73.3 (*) 78.0 - 100.0 fL   MCH 26.4  26.0 - 34.0 pg   MCHC 36.1 (*) 30.0 - 36.0 g/dL   RDW 91.4  78.2 - 95.6 %   Platelets 276  150 - 400 K/uL   Ct Abdomen Pelvis W Contrast  10/07/2012  *RADIOLOGY REPORT*  Clinical Data: Abdominal pain.  CT ABDOMEN AND PELVIS WITH CONTRAST  Technique:  Multidetector CT imaging of the abdomen and pelvis was performed following the standard protocol during bolus administration of intravenous contrast.  Contrast: 100 mL of Omnipaque 300 IV contrast  Comparison: CT of the abdomen and pelvis performed 08/25/2012  Findings: Mild bibasilar atelectasis is noted.  There is recurrent marked distension of proximal small bowel loops, measuring up to 7.6 cm in diameter.  This progresses to the level of the upper pelvis, with gradual decompression at the proximal to mid ileum, and a likely transition point at the previously noted area of scarring, dystrophic calcification and fluid along the lower anterior abdominal wall.  The appearance of the  scarring is grossly unchanged from the prior study,  with surrounding postoperative change.  This most likely reflects underlying adhesions.  More distal small bowel is decompressed.  A small amount of residual fluid and air is seen within the colon.  Mild presacral stranding is grossly stable and may reflect the patient's baseline.  A small amount of free fluid is again noted within the pelvis.  There is diffuse dilatation of the intrahepatic biliary ducts, perhaps slightly improved from the prior study.  This may remain within normal limits status post cholecystectomy.  A stable vague 2.2 cm hypodensity within the right hepatic lobe likely reflects a cyst.  Additional small hypodensities in the liver are nonspecific but may reflect small cysts.  The spleen is grossly unremarkable in appearance.  The pancreas and adrenal glands are within normal limits.  Mild scattered hypodensities in the periphery of both kidneys likely reflect small cysts.  There is no evidence of hydronephrosis.  No renal or ureteral stones are identified.  No significant perinephric stranding is seen.  The stomach is filled with contrast and is within normal limits. No acute vascular abnormalities are seen.  Relatively diffuse calcification is noted along the abdominal aorta and its branches.  The appendix is not definitely characterized; there is no evidence for appendicitis.  As described above, the colon is grossly unremarkable in appearance.  The patient has an ileal conduit, with an associated ostomy at the right lower quadrant; this is not well assessed but appears grossly unremarkable.  The patient is status post hysterectomy.  No suspicious adnexal masses are seen.  No inguinal lymphadenopathy is seen.  No acute osseous abnormalities are identified.  There is partial osseous fusion of L5 and S1.  IMPRESSION:  1.  Recurrent acute small bowel obstruction, with marked distension of proximal small bowel loops to 7.6 cm in maximal diameter.  Gradual decompression noted at the proximal to mid ileum, with a likely transition point noted slightly more distally, at the previously seen area of scarring, dystrophic calcification and fluid along the lower anterior abdominal wall.  This likely reflects underlying adhesions.  On the prior study, a similar appearance was eventually thought to reflect partial small bowel obstruction, given a follow-up abdominal radiograph that showed contrast progression into the colon.  Follow-up abdominal radiograph could also be considered in this case, to assess whether contrast progresses into the colon. 2.  Stable appearance to mild presacral stranding and small amount of free fluid within the pelvis. 3.  Likely hepatic and tiny bilateral renal cysts noted. 4.  Relatively diffuse calcification along the abdominal aorta and its branches. 5.  Ileal conduit grossly unremarkable in appearance, though difficult to fully assess.  These results were called by telephone on 10/07/2012 at 09:40 p.m. to Dr. Bethann Berkshire, who verbally acknowledged these results.   Original Report Authenticated By: Tonia Ghent, M.D.     Review of Systems  Constitutional: Positive for chills.  HENT: Negative.   Eyes: Negative.   Respiratory: Negative.   Cardiovascular: Negative.   Gastrointestinal: Positive for nausea, vomiting and abdominal pain. Negative for diarrhea, constipation, blood in stool and melena.  Genitourinary: Positive for dysuria.  Musculoskeletal: Negative.   Skin: Negative.   Neurological: Negative.   Endo/Heme/Allergies: Negative.   Psychiatric/Behavioral: Negative.     Blood pressure 112/73, pulse 62, temperature 98.2 F (36.8 C), temperature source Oral, resp. rate 18, height 5\' 5"  (1.651 m), weight 74.8 kg (164 lb 14.5 oz), SpO2 99.00%. Physical Exam  Constitutional: She is oriented to person, place, and time. She appears  well-developed and well-nourished. No distress.  HENT:  Head: Normocephalic and  atraumatic.  Eyes: Conjunctivae and EOM are normal. Pupils are equal, round, and reactive to light. No scleral icterus.  Neck: Normal range of motion. Neck supple. No tracheal deviation present. No thyromegaly present.  Cardiovascular: Normal rate, regular rhythm and normal heart sounds.   Respiratory: Effort normal and breath sounds normal.  GI: Soft. Bowel sounds are normal. She exhibits no distension and no mass. There is tenderness (mild tenderness.  ). There is no rebound and no guarding.  Ostomy with pouch.  No hernia.  + urine in pouch  Musculoskeletal: Normal range of motion.  Lymphadenopathy:    She has no cervical adenopathy.  Neurological: She is alert and oriented to person, place, and time.  Skin: Skin is warm and dry.     Assessment/Plan SBO versus ileus.  Will continue to treat suspected UTI which may be contributing to ileus.  Initial bowel rest has helped with nausea.  Appetite improving.  Continue IV hydration.  Nocturnal TPN will be resumed.  Indications for surgical intervention discussed with patient.    Juwana Thoreson C 10/08/2012, 11:30 PM

## 2012-10-09 LAB — COMPREHENSIVE METABOLIC PANEL
AST: 15 U/L (ref 0–37)
Albumin: 2.6 g/dL — ABNORMAL LOW (ref 3.5–5.2)
Alkaline Phosphatase: 166 U/L — ABNORMAL HIGH (ref 39–117)
BUN: 20 mg/dL (ref 6–23)
CO2: 25 mEq/L (ref 19–32)
Chloride: 103 mEq/L (ref 96–112)
Creatinine, Ser: 1.06 mg/dL (ref 0.50–1.10)
GFR calc non Af Amer: 55 mL/min — ABNORMAL LOW (ref >90)
Potassium: 3.8 mEq/L (ref 3.5–5.1)
Total Bilirubin: 0.3 mg/dL (ref 0.3–1.2)

## 2012-10-09 LAB — DIFFERENTIAL
Eosinophils Relative: 2 % (ref 0–5)
Lymphocytes Relative: 33 % (ref 12–46)
Lymphs Abs: 0.9 10*3/uL (ref 0.7–4.0)
Monocytes Absolute: 0.4 10*3/uL (ref 0.1–1.0)
Monocytes Relative: 14 % — ABNORMAL HIGH (ref 3–12)
Neutro Abs: 1.4 10*3/uL — ABNORMAL LOW (ref 1.7–7.7)

## 2012-10-09 LAB — CBC
HCT: 30.3 % — ABNORMAL LOW (ref 36.0–46.0)
Hemoglobin: 10.6 g/dL — ABNORMAL LOW (ref 12.0–15.0)
MCV: 74.4 fL — ABNORMAL LOW (ref 78.0–100.0)
WBC: 2.8 10*3/uL — ABNORMAL LOW (ref 4.0–10.5)

## 2012-10-09 LAB — PREALBUMIN: Prealbumin: 13.5 mg/dL — ABNORMAL LOW (ref 17.0–34.0)

## 2012-10-09 LAB — MAGNESIUM: Magnesium: 2 mg/dL (ref 1.5–2.5)

## 2012-10-09 LAB — TRIGLYCERIDES: Triglycerides: 56 mg/dL (ref <150)

## 2012-10-09 LAB — URINE CULTURE

## 2012-10-09 MED ORDER — STERILE WATER FOR INJECTION IV SOLN
INTRAVENOUS | Status: AC
  Administered 2012-10-09: 22:00:00 via INTRAVENOUS

## 2012-10-09 MED ORDER — ADULT MULTIVITAMIN W/MINERALS CH: 1.0000 | ORAL_TABLET | Freq: Every day | ORAL | Status: DC

## 2012-10-09 MED ORDER — ADULT MULTIVITAMIN W/MINERALS CH
1.0000 | ORAL_TABLET | Freq: Every day | ORAL | Status: DC
  Administered 2012-10-11 – 2012-10-13 (×3): 1 via ORAL
  Filled 2012-10-09 (×3): qty 1

## 2012-10-09 NOTE — Progress Notes (Signed)
Subjective: Pain better.  No nausea.  No fever or chills.  +BM  Objective: Vital signs in last 24 hours: Temp:  [98.1 F (36.7 [name])-98.4 F (36.9 [name])] 98.1 F (36.7 [name]) (04/11 1420) Pulse Rate:  [62-67] 66 (04/11 1420) Resp:  [17-18] 18 (04/11 1420) BP: (99-127)/(68-87) 127/87 mmHg (04/11 1420) SpO2:  [97 %-99 %] 97 % (04/11 1420) Last BM Date: 10/08/12  Intake/Output from previous day: 04/10 0701 - 04/11 0700 In: 240 [P.O.:240] Out: 2050 [Urine:2050] Intake/Output this shift: Total I/O In: 480 [P.O.:480] Out: 1650 [Urine:1650]  General appearance: alert and no distress GI: soft, non-tender; bowel sounds normal; no masses,  no organomegaly and urostomy functional  Lab Results:   Recent Labs  10/08/12 0530 10/09/12 0541  WBC 3.6* 2.8*  HGB 10.6* 10.6*  HCT 29.4* 30.3*  PLT 276 262   BMET  Recent Labs  10/08/12 0530 10/09/12 0541  NA 136 137  K 3.9 3.8  CL 103 103  CO2 23 25  GLUCOSE 71 139*  BUN 20 20  CREATININE 1.05 1.06  CALCIUM 8.8 8.4   PT/INR No results found for this basename: LABPROT, INR,  in the last 72 hours ABG No results found for this basename: PHART, PCO2, PO2, HCO3,  in the last 72 hours  Studies/Results: Ct Abdomen Pelvis W Contrast  10/07/2012  *RADIOLOGY REPORT*  Clinical Data: Abdominal pain.  CT ABDOMEN AND PELVIS WITH CONTRAST  Technique:  Multidetector CT imaging of the abdomen and pelvis was performed following the standard protocol during bolus administration of intravenous contrast.  Contrast: 100 mL of Omnipaque 300 IV contrast  Comparison: CT of the abdomen and pelvis performed 08/25/2012  Findings: Mild bibasilar atelectasis is noted.  There is recurrent marked distension of proximal small bowel loops, measuring up to 7.6 cm in diameter.  This progresses to the level of the upper pelvis, with gradual decompression at the proximal to mid ileum, and a likely transition point at the previously noted area of scarring, dystrophic  calcification and fluid along the lower anterior abdominal wall.  The appearance of the scarring is grossly unchanged from the prior study, with surrounding postoperative change.  This most likely reflects underlying adhesions.  More distal small bowel is decompressed.  A small amount of residual fluid and air is seen within the colon.  Mild presacral stranding is grossly stable and may reflect the patient's baseline.  A small amount of free fluid is again noted within the pelvis.  There is diffuse dilatation of the intrahepatic biliary ducts, perhaps slightly improved from the prior study.  This may remain within normal limits status post cholecystectomy.  A stable vague 2.2 cm hypodensity within the right hepatic lobe likely reflects a cyst.  Additional small hypodensities in the liver are nonspecific but may reflect small cysts.  The spleen is grossly unremarkable in appearance.  The pancreas and adrenal glands are within normal limits.  Mild scattered hypodensities in the periphery of both kidneys likely reflect small cysts.  There is no evidence of hydronephrosis.  No renal or ureteral stones are identified.  No significant perinephric stranding is seen.  The stomach is filled with contrast and is within normal limits. No acute vascular abnormalities are seen.  Relatively diffuse calcification is noted along the abdominal aorta and its branches.  The appendix is not definitely characterized; there is no evidence for appendicitis.  As described above, the colon is grossly unremarkable in appearance.  The patient has an ileal conduit, with an  associated ostomy at the right lower quadrant; this is not well assessed but appears grossly unremarkable.  The patient is status post hysterectomy.  No suspicious adnexal masses are seen.  No inguinal lymphadenopathy is seen.  No acute osseous abnormalities are identified.  There is partial osseous fusion of L5 and S1.  IMPRESSION:  1.  Recurrent acute small bowel  obstruction, with marked distension of proximal small bowel loops to 7.6 cm in maximal diameter. Gradual decompression noted at the proximal to mid ileum, with a likely transition point noted slightly more distally, at the previously seen area of scarring, dystrophic calcification and fluid along the lower anterior abdominal wall.  This likely reflects underlying adhesions.  On the prior study, a similar appearance was eventually thought to reflect partial small bowel obstruction, given a follow-up abdominal radiograph that showed contrast progression into the colon.  Follow-up abdominal radiograph could also be considered in this case, to assess whether contrast progresses into the colon. 2.  Stable appearance to mild presacral stranding and small amount of free fluid within the pelvis. 3.  Likely hepatic and tiny bilateral renal cysts noted. 4.  Relatively diffuse calcification along the abdominal aorta and its branches. 5.  Ileal conduit grossly unremarkable in appearance, though difficult to fully assess.  These results were called by telephone on 10/07/2012 at 09:40 p.m. to Dr. Bethann Berkshire, who verbally acknowledged these results.   Original Report Authenticated By: Tonia Ghent, M.D.     Anti-infectives: Anti-infectives   Start     Dose/Rate Route Frequency Ordered Stop   10/08/12 1800  levofloxacin (LEVAQUIN) IVPB 500 mg     500 mg 100 mL/hr over 60 Minutes Intravenous Every 24 hours 10/08/12 1642        Assessment/Plan: s/p * No surgery found * Continue to advance diet.  If nausea and pain still improved tomorrow and tolerating soft diet, anticipate D/[name].  LOS: 2 days    [name] 10/09/2012

## 2012-10-09 NOTE — Progress Notes (Signed)
INITIAL NUTRITION ASSESSMENT  DOCUMENTATION CODES Per approved criteria  -Not Applicable   INTERVENTION: TPN per pharmacy  NUTRITION DIAGNOSIS: Inadequate oral intake related to altered GI function as evidenced by recurrent SBO, chronic nocturnal TPN.   Goal: Pt to meet >/= 90% of their estimated nutrition needs  Monitor:  nutrition support, Po intake, labs and wt trends  Reason for Assessment: Home TPN  62 y.o. female  Admitting c/o: abdominal pain, nausea   ASSESSMENT:  Pt has hx of recurrent SBO. Her wt hx is stable. She is unable to maintain adequate oral intake therefore has been receiving home TPN >40months. Eats soft foods at home such as mashed potatoes, applesauce etc. Currently on Clear liquids and nocturnal TPN.   Height: Ht Readings from Last 1 Encounters:  10/07/12 5\' 5"  (1.651 m)    Weight: Wt Readings from Last 1 Encounters:  10/07/12 164 lb 14.5 oz (74.8 kg)    Ideal Body Weight: 125# (56.8 kg)  % Ideal Body Weight: 132%  Wt Readings from Last 10 Encounters:  10/07/12 164 lb 14.5 oz (74.8 kg)  09/22/12 157 lb 1.9 oz (71.269 kg)  09/01/12 159 lb 6.3 oz (72.3 kg)  07/21/12 160 lb (72.576 kg)  05/26/12 163 lb 9.6 oz (74.208 kg)  05/21/12 160 lb 1.9 oz (72.63 kg)  05/01/12 164 lb 6.4 oz (74.571 kg)  03/27/12 158 lb (71.668 kg)  02/20/12 160 lb (72.576 kg)  02/18/12 159 lb 1.9 oz (72.176 kg)    Usual Body Weight: 160-165#  % Usual Body Weight: 100%  BMI:  Body mass index is 27.44 kg/(m^2).Overweight  Estimated Nutritional Needs: Kcal: 1425-1710  Protein: 68-75 gr  Fluid: >1700 ml/day   Skin: No issues noted  Diet Order: Clear Liquid  EDUCATION NEEDS: -No education needs identified at this time   Intake/Output Summary (Last 24 hours) at 10/09/12 0841 Last data filed at 10/09/12 0214  Gross per 24 hour  Intake    240 ml  Output   2050 ml  Net  -1810 ml    Last BM: 10/08/12  Labs:   Recent Labs Lab 10/07/12 1820  10/08/12 0530 10/09/12 0541  NA 136 136 137  K 4.2 3.9 3.8  CL 102 103 103  CO2 25 23 25   BUN 25* 20 20  CREATININE 0.95 1.05 1.06  CALCIUM 9.1 8.8 8.4  MG  --   --  2.0  PHOS  --   --  4.1  GLUCOSE 80 71 139*    CBG (last 3)  No results found for this basename: GLUCAP,  in the last 72 hours  Scheduled Meds: . docusate sodium  100 mg Oral BID  . enoxaparin (LOVENOX) injection  40 mg Subcutaneous Q24H  . levofloxacin (LEVAQUIN) IV  500 mg Intravenous Q24H  . pantoprazole (PROTONIX) IV  40 mg Intravenous QHS  . polyethylene glycol  17 g Oral Daily    Continuous Infusions: . lactated ringers 110 mL/hr at 10/09/12 0239  . TPN ADULT 237.5 mL/hr at 10/09/12 0016    Past Medical History  Diagnosis Date  . Bowel obstruction several    Recurrent SBO secondary to adhesions  . Hypothyroidism   . Acid reflux   . Chronic back pain   . Hyperlipidemia   . Hypertension     pt says taken off medication since has lost weight.  . Cancer 2011 [facility])    diagnosed in 2009 per pt. Invasive High grade Urothelial carcinoma- s/p radiation and Chemo   .  UTI (lower urinary tract infection)     Recurrent  . Malnutrition   . Leukopenia 12/06/2011    HIV serology negative  . Fatty liver     Past Surgical History  Procedure Laterality Date  . Back surgery      X2  . Hernia repair      mesh  . Abdominal surgery      with intestinal "puncture" x 2, exploratory surgery   . Cholecystectomy    . Ileo conduit      For bladder cancer  . Cystectomy      breast  . Portacath placement    . Bladder removal      Royann Shivers MS,RD,LDN,CSG Office: [phone] Pager: [phone]

## 2012-10-09 NOTE — Progress Notes (Signed)
UR Chart Review Completed  

## 2012-10-09 NOTE — Progress Notes (Signed)
10/09/12 1435 Patient ambulates in hallway this afternoon with multiple rounds around nurses station, tolerating well. States helps reduce some abdominal pain and gas discomfort when walking. Instructed to notify nursing staff of any weakness/dizziness. States will do so. Earnstine Regal, RN

## 2012-10-09 NOTE — Progress Notes (Addendum)
PARENTERAL NUTRITION CONSULT NOTE - INITIAL  Pharmacy Consult for TPN (chronic cyclic TPN from home) Indication: unable to tolerate full diet  Allergies  Allergen Reactions  . Bactrim (Sulfamethoxazole W-Trimethoprim)   . Codeine Hives  . Nitrofuran Derivatives Itching  . Penicillins Hives  . Sulfa Antibiotics Hives   Patient Measurements: Height: 5\' 5"  (165.1 cm) Weight: 164 lb 14.5 oz (74.8 kg) IBW/kg (Calculated) : 57 Usual body weight = 160-165#  Vital Signs: Temp: 98.3 F (36.8 C) (04/11 0519) Temp src: Oral (04/11 0519) BP: 99/68 mmHg (04/11 0519) Pulse Rate: 67 (04/11 0519) Intake/Output from previous day: 04/10 0701 - 04/11 0700 In: 240 [P.O.:240] Out: 2050 [Urine:2050] Intake/Output from this shift: Total I/O In: -  Out: 900 [Urine:900]  Labs:  Recent Labs  10/07/12 1820 10/08/12 0530 10/09/12 0541  WBC 5.1 3.6* 2.8*  HGB 12.4 10.6* 10.6*  HCT 34.5* 29.4* 30.3*  PLT 328 276 262     Recent Labs  10/07/12 1820 10/08/12 0530 10/09/12 0541 10/09/12 0547  NA 136 136 137  --   K 4.2 3.9 3.8  --   CL 102 103 103  --   CO2 25 23 25   --   GLUCOSE 80 71 139*  --   BUN 25* 20 20  --   CREATININE 0.95 1.05 1.06  --   CALCIUM 9.1 8.8 8.4  --   MG  --   --  2.0  --   PHOS  --   --  4.1  --   PROT 7.1 6.0 5.6*  --   ALBUMIN 3.4* 2.9* 2.6*  --   AST 17 16 15   --   ALT 11 9 10   --   ALKPHOS 188* 175* 166*  --   BILITOT 0.4 0.3 0.3  --   TRIG  --   --   --  56  CHOL  --   --   --  92   Estimated Creatinine Clearance: 56.4 ml/min (by C-G formula based on Cr of 1.06).   No results found for this basename: GLUCAP,  in the last 72 hours  Medical History: Past Medical History  Diagnosis Date  . Bowel obstruction several    Recurrent SBO secondary to adhesions  . Hypothyroidism   . Acid reflux   . Chronic back pain   . Hyperlipidemia   . Hypertension     pt says taken off medication since has lost weight.  . Cancer 2011 [facility])   diagnosed in 2009 per pt. Invasive High grade Urothelial carcinoma- s/p radiation and Chemo   . UTI (lower urinary tract infection)     Recurrent  . Malnutrition   . Leukopenia 12/06/2011    HIV serology negative  . Fatty liver     Medications:  Prescriptions prior to admission  Medication Sig Dispense Refill  . dexlansoprazole (DEXILANT) 60 MG capsule Take 60 mg by mouth daily.      Marland Kitchen docusate sodium (COLACE) 100 MG capsule Take 100 mg by mouth 2 (two) times daily.       Marland Kitchen HYDROcodone-acetaminophen (NORCO) 5-325 MG per tablet Take 1 tablet by mouth every 4 (four) hours as needed for pain.  45 tablet  2  . HYDROmorphone (DILAUDID) 2 MG tablet Take 2 mg by mouth every 6 (six) hours as needed for pain.      Marland Kitchen levothyroxine (SYNTHROID, LEVOTHROID) 88 MCG tablet Take 1 tablet (88 mcg total) by mouth daily.  30 tablet  3  . Multiple Vitamin (MULITIVITAMIN WITH MINERALS) TABS Take 1 tablet by mouth every other day. On Tuesdays, Thursdays, Saturdays, and Sundays. **Administered at bedtime with TPN administration      . polyethylene glycol powder (GLYCOLAX/MIRALAX) powder Take 17 g by mouth daily.      . TPN NICU Inject into the vein continuous. Formula copy place in paper chart.   Lipids daily due to use of 3 in 1 bag.      . promethazine (PHENERGAN) 12.5 MG tablet Take 1 tablet (12.5 mg total) by mouth every 8 (eight) hours as needed for nausea.  10 tablet  0    Insulin Requirements in the past 24 hours:  none  Current Nutrition:  TPN from home + some soft foods  Assessment: 62yo female maintained chronically at home on cyclic TPN regimen compounded by [facility] pharmacy.  Pt has a TPN bag from home to use tonight.  May need to transition to [facility] formulary product over the weekend which will not be an exact match in terms of formulation.  Pt known to [place] from previous admission.  Nutritional Goals:  To meet >/= 90% of estimated nutrition needs Kcal: 1425-1710 Protein: 68-75gm Fluid: >  1771ml/day  Plan:  Continue cyclic regimen today with pt's own supply of TPN from home.  Provide Lipids on Mon and Thur. Multivitamin 1 PO daily F/U labs and sugars tomorrow Transition to [facility] formulary TPN (Clinimix E 5/15) if needed   Valrie Hart A 10/09/2012,10:22 AM

## 2012-10-10 LAB — BASIC METABOLIC PANEL
BUN: 18 mg/dL (ref 6–23)
CO2: 25 mEq/L (ref 19–32)
Calcium: 8.3 mg/dL — ABNORMAL LOW (ref 8.4–10.5)
Chloride: 103 mEq/L (ref 96–112)
Creatinine, Ser: 0.96 mg/dL (ref 0.50–1.10)
Glucose, Bld: 136 mg/dL — ABNORMAL HIGH (ref 70–99)

## 2012-10-10 MED ORDER — LEVOFLOXACIN 500 MG PO TABS: 500.0000 mg | ORAL_TABLET | Freq: Every day | ORAL | Status: DC

## 2012-10-10 MED ORDER — CLINIMIX E/DEXTROSE (5/15) 5 % IV SOLN
INTRAVENOUS | Status: AC
  Administered 2012-10-10: 22:00:00 via INTRAVENOUS
  Filled 2012-10-10: qty 2000

## 2012-10-10 MED ORDER — SODIUM CHLORIDE 0.9 % IJ SOLN
INTRAMUSCULAR | Status: AC
  Administered 2012-10-10: 13:00:00
  Filled 2012-10-10: qty 3

## 2012-10-10 NOTE — Discharge Summary (Signed)
Physician Discharge Summary  Patient ID: [name] MRN: [id number] DOB/AGE: Nov 22, 1950 62 y.o.  Admit date: 10/07/2012 Discharge date: 10/10/2012  Admission Diagnoses:SBO vs ileus   Discharge Diagnoses: the same Active Problems:   * No active hospital problems. *   Discharged Condition: stable  Hospital Course: Patient presented to [facility] with several days of worsening abdominal pain and nausea.  Work-up suspicious for SBO vs ileus as patient has evidence of UTI.  Patient has significant abdominal surgical history and history of pelvic radiation.  Patient admitted for conservative measures.  Continued to show slow improvement.  Patient noted to have increase in bowel function.  Was advanced back to soft diet without issue.  Plans for discharge.  Consults: None  Significant Diagnostic Studies: labs: daily and radiology: CT scan: abd/pel  Treatments: IV hydration and antibiotics: Levaquin  Discharge Exam: Blood pressure 118/69, pulse 77, temperature 98.2 F (36.8 C), temperature source Oral, resp. rate 20, height 5\' 5"  (1.651 m), weight 74.8 kg (164 lb 14.5 oz), SpO2 100.00%. General appearance: alert and no distress Resp: clear to auscultation bilaterally Cardio: regular rate and rhythm GI: soft, non-tender; bowel sounds normal; no masses,  no organomegaly and urostomy functional.  Disposition: 06-Home-Health Care Svc   Future Appointments Provider Department Dept Phone   10/22/2012 2:30 PM Salley Scarlet, MD [facility] [phone]   12/28/2012 2:00 PM Salley Scarlet, MD [facility] [phone]       Medication List    TAKE these medications       dexlansoprazole 60 MG capsule  Commonly known as:  DEXILANT  Take 60 mg by mouth daily.     docusate sodium 100 MG capsule  Commonly known as:  COLACE  Take 100 mg by mouth 2 (two) times daily.     HYDROcodone-acetaminophen 5-325 MG per tablet  Commonly known as:  NORCO  Take 1 tablet by  mouth every 4 (four) hours as needed for pain.     HYDROmorphone 2 MG tablet  Commonly known as:  DILAUDID  Take 2 mg by mouth every 6 (six) hours as needed for pain.     levofloxacin 500 MG tablet  Commonly known as:  LEVAQUIN  Take 1 tablet (500 mg total) by mouth daily.     levothyroxine 88 MCG tablet  Commonly known as:  SYNTHROID, LEVOTHROID  Take 1 tablet (88 mcg total) by mouth daily.     multivitamin with minerals Tabs  Take 1 tablet by mouth every other day. On Tuesdays, Thursdays, Saturdays, and Sundays. **Administered at bedtime with TPN administration     polyethylene glycol powder powder  Commonly known as:  GLYCOLAX/MIRALAX  Take 17 g by mouth daily.     promethazine 12.5 MG tablet  Commonly known as:  PHENERGAN  Take 1 tablet (12.5 mg total) by mouth every 8 (eight) hours as needed for nausea.     TPN NICU  Inject into the vein continuous. Formula copy place in paper chart.   Lipids daily due to use of 3 in 1 bag.         SignedFabio Bering 10/10/2012, 11:54 AM

## 2012-10-10 NOTE — Progress Notes (Signed)
10/10/12 1934 Late entry for 1130 this morning. Patient to be discharged if able to have TPN delivered for home use. Spoke with representative at [facility] home delivery this morning regarding resumption of home TPN. Stated infusion department not open on weekends, will reopen Monday morning. Notified Dr Gardiner Barefoot patient to stay the weekend, pharmacy to dose TPN for Saturday and Sunday night. Orders placed, pharmacy notified. Discussed with patient and daughter this morning, both agreeable to plan. Stated felt more comfortable staying until Monday anyway. Earnstine Regal, RN

## 2012-10-10 NOTE — Progress Notes (Signed)
10/10/12 1851 Patient tolerated full liquids well this evening, ambulating in hallway, up to chair. States some abdominal discomfort at times, instructed to notify nursing staff if worsening pain or pain medication needed. Up in chair this evening, states comfortable. Earnstine Regal, RN

## 2012-10-10 NOTE — Progress Notes (Signed)
10/10/12 1657 Patient c/o abdominal pain this afternoon after trying soft diet for lunch. Stated ate few bites of mashed potatoes but unable to tolerate. Patient placed on full liquids, ambulated in hallway this afternoon. Stated pain eased some with activity and dilaudid as ordered PRN for pain. Denies nausea, no vomiting, abdomen soft, hypoactive bowel sounds present, states had bowel movement today. Notified Dr Leticia Penna of patient status and back to full liquids, stated okay. Earnstine Regal, RN

## 2012-10-10 NOTE — Progress Notes (Signed)
PARENTERAL NUTRITION CONSULT NOTE - INITIAL  Pharmacy Consult for TPN (chronic cyclic TPN from home) Indication: unable to tolerate full diet  Allergies  Allergen Reactions  . Bactrim (Sulfamethoxazole W-Trimethoprim)   . Codeine Hives  . Nitrofuran Derivatives Itching  . Penicillins Hives  . Sulfa Antibiotics Hives   Patient Measurements: Height: 5\' 5"  (165.1 cm) Weight: 164 lb 14.5 oz (74.8 kg) IBW/kg (Calculated) : 57 Usual body weight = 160-165#  Vital Signs: Temp: 98.2 F (36.8 C) (04/12 0557) BP: 118/69 mmHg (04/12 0557) Pulse Rate: 77 (04/12 0557) Intake/Output from previous day: 04/11 0701 - 04/12 0700 In: 9918.3 [P.O.:720; I.V.:2049.5; AVW:0981.1] Out: 2875 [Urine:2875] Intake/Output from this shift:    Labs:  Recent Labs  10/07/12 1820 10/08/12 0530 10/09/12 0541  WBC 5.1 3.6* 2.8*  HGB 12.4 10.6* 10.6*  HCT 34.5* 29.4* 30.3*  PLT 328 276 262     Recent Labs  10/07/12 1820 10/08/12 0530 10/09/12 0541 10/09/12 0547 10/10/12 0556  NA 136 136 137  --  137  K 4.2 3.9 3.8  --  3.9  CL 102 103 103  --  103  CO2 25 23 25   --  25  GLUCOSE 80 71 139*  --  136*  BUN 25* 20 20  --  18  CREATININE 0.95 1.05 1.06  --  0.96  CALCIUM 9.1 8.8 8.4  --  8.3*  MG  --   --  2.0  --   --   PHOS  --   --  4.1  --   --   PROT 7.1 6.0 5.6*  --   --   ALBUMIN 3.4* 2.9* 2.6*  --   --   AST 17 16 15   --   --   ALT 11 9 10   --   --   ALKPHOS 188* 175* 166*  --   --   BILITOT 0.4 0.3 0.3  --   --   PREALBUMIN  --   --  13.5*  --   --   TRIG  --   --   --  56  --   CHOL  --   --   --  92  --    Estimated Creatinine Clearance: 62.3 ml/min (by C-G formula based on Cr of 0.96).   No results found for this basename: GLUCAP,  in the last 72 hours  Medical History: Past Medical History  Diagnosis Date  . Bowel obstruction several    Recurrent SBO secondary to adhesions  . Hypothyroidism   . Acid reflux   . Chronic back pain   . Hyperlipidemia   .  Hypertension     pt says taken off medication since has lost weight.  . Cancer 2011 [facility])    diagnosed in 2009 per pt. Invasive High grade Urothelial carcinoma- s/p radiation and Chemo   . UTI (lower urinary tract infection)     Recurrent  . Malnutrition   . Leukopenia 12/06/2011    HIV serology negative  . Fatty liver     Medications:  Prescriptions prior to admission  Medication Sig Dispense Refill  . dexlansoprazole (DEXILANT) 60 MG capsule Take 60 mg by mouth daily.      Marland Kitchen docusate sodium (COLACE) 100 MG capsule Take 100 mg by mouth 2 (two) times daily.       Marland Kitchen HYDROcodone-acetaminophen (NORCO) 5-325 MG per tablet Take 1 tablet by mouth every 4 (four) hours as needed for pain.  45  tablet  2  . HYDROmorphone (DILAUDID) 2 MG tablet Take 2 mg by mouth every 6 (six) hours as needed for pain.      Marland Kitchen levothyroxine (SYNTHROID, LEVOTHROID) 88 MCG tablet Take 1 tablet (88 mcg total) by mouth daily.  30 tablet  3  . Multiple Vitamin (MULITIVITAMIN WITH MINERALS) TABS Take 1 tablet by mouth every other day. On Tuesdays, Thursdays, Saturdays, and Sundays. **Administered at bedtime with TPN administration      . polyethylene glycol powder (GLYCOLAX/MIRALAX) powder Take 17 g by mouth daily.      . TPN NICU Inject into the vein continuous. Formula copy place in paper chart.   Lipids daily due to use of 3 in 1 bag.      . promethazine (PHENERGAN) 12.5 MG tablet Take 1 tablet (12.5 mg total) by mouth every 8 (eight) hours as needed for nausea.  10 tablet  0    Insulin Requirements in the past 24 hours:  none  Current Nutrition:  TPN from home + some soft foods  Assessment: 62yo female maintained chronically at home on cyclic TPN regimen compounded by [facility] pharmacy. Pt known to [place] from previous admission.   She exhausted her home TPN supply.  Will transition to hospital-supplied Clinimix over weekend.  This has been done during previous admissions & patient tolerated well.   Electrolytes remain at goal.  Glucose <150.  Nutritional Goals:  To meet >/= 90% of estimated nutrition needs Kcal: 1425-1710 Protein: 68-75gm Fluid: > 1717ml/day  Plan:  Clinimix E 5/15 infused from 9pm-9am nightly.  Provide Lipids on Mon and Thur. Multivitamin 1 PO daily F/U Bmet in am  Elson Clan 10/10/2012,11:19 AM

## 2012-10-11 LAB — BASIC METABOLIC PANEL
BUN: 21 mg/dL (ref 6–23)
CO2: 23 mEq/L (ref 19–32)
Chloride: 104 mEq/L (ref 96–112)
GFR calc non Af Amer: 58 mL/min — ABNORMAL LOW (ref >90)
Glucose, Bld: 111 mg/dL — ABNORMAL HIGH (ref 70–99)
Potassium: 4.2 mEq/L (ref 3.5–5.1)

## 2012-10-11 MED ORDER — LEVOFLOXACIN 500 MG PO TABS
500.0000 mg | ORAL_TABLET | Freq: Every day | ORAL | Status: DC
  Administered 2012-10-11 – 2012-10-12 (×2): 500 mg via ORAL
  Filled 2012-10-11 (×2): qty 1

## 2012-10-11 MED ORDER — PANTOPRAZOLE SODIUM 40 MG PO TBEC
40.0000 mg | DELAYED_RELEASE_TABLET | Freq: Every day | ORAL | Status: DC
  Administered 2012-10-11 – 2012-10-13 (×3): 40 mg via ORAL
  Filled 2012-10-11 (×3): qty 1

## 2012-10-11 MED ORDER — CLINIMIX E/DEXTROSE (5/15) 5 % IV SOLN
INTRAVENOUS | Status: AC
  Administered 2012-10-11: 22:00:00 via INTRAVENOUS
  Filled 2012-10-11: qty 2000

## 2012-10-11 MED ORDER — SODIUM CHLORIDE 0.9 % IJ SOLN
INTRAMUSCULAR | Status: AC
  Administered 2012-10-11: 10 mL
  Filled 2012-10-11: qty 3

## 2012-10-11 NOTE — Progress Notes (Signed)
10/11/12 1249 Patient reported burping small amounts of brown emesis at times to night shift RN per report this morning. No c/o this morning, and no c/o abdominal pain this morning. Tolerating full liquids well. Notified Dr Leticia Penna of patient reports during night. Stated would see patient. Earnstine Regal, RN

## 2012-10-11 NOTE — Progress Notes (Signed)
10/11/12 1826 Patient ambulated in hallway this afternoon, tolerated well. States bowel movement today, tolerating full liquids well. No c/o nausea or vomiting. States prefers to remain on full liquids at this time due to abdominal discomfort at times. Pt had requested TPN run until bag completed as she does at home sometimes. Dr Leticia Penna stated okay to do so as patient requested, since not eating much.  Earnstine Regal, RN

## 2012-10-11 NOTE — Progress Notes (Signed)
PARENTERAL NUTRITION CONSULT NOTE  Pharmacy Consult for TPN (chronic cyclic TPN from home) Indication: unable to tolerate full diet  Allergies  Allergen Reactions  . Bactrim (Sulfamethoxazole W-Trimethoprim)   . Codeine Hives  . Nitrofuran Derivatives Itching  . Penicillins Hives  . Sulfa Antibiotics Hives   Patient Measurements: Height: 5\' 5"  (165.1 cm) Weight: 164 lb 14.5 oz (74.8 kg) IBW/kg (Calculated) : 57 Usual body weight = 160-165#  Vital Signs: Temp: 98.1 F (36.7 C) (04/13 0616) BP: 104/65 mmHg (04/13 0616) Pulse Rate: 72 (04/13 0616) Intake/Output from previous day: 04/12 0701 - 04/13 0700 In: 9389 [P.O.:720; I.V.:2649.2; IV Piggyback:300; TPN:5719.8] Out: 600 [Urine:600] Intake/Output from this shift:    Labs:  Recent Labs  10/09/12 0541  WBC 2.8*  HGB 10.6*  HCT 30.3*  PLT 262     Recent Labs  10/09/12 0541 10/09/12 0547 10/10/12 0556 10/11/12 0614  NA 137  --  137 134*  K 3.8  --  3.9 4.2  CL 103  --  103 104  CO2 25  --  25 23  GLUCOSE 139*  --  136* 111*  BUN 20  --  18 21  CREATININE 1.06  --  0.96 1.02  CALCIUM 8.4  --  8.3* 8.5  MG 2.0  --   --   --   PHOS 4.1  --   --   --   PROT 5.6*  --   --   --   ALBUMIN 2.6*  --   --   --   AST 15  --   --   --   ALT 10  --   --   --   ALKPHOS 166*  --   --   --   BILITOT 0.3  --   --   --   PREALBUMIN 13.5*  --   --   --   TRIG  --  56  --   --   CHOL  --  92  --   --    Estimated Creatinine Clearance: 58.6 ml/min (by C-G formula based on Cr of 1.02).   No results found for this basename: GLUCAP,  in the last 72 hours  Medical History: Past Medical History  Diagnosis Date  . Bowel obstruction several    Recurrent SBO secondary to adhesions  . Hypothyroidism   . Acid reflux   . Chronic back pain   . Hyperlipidemia   . Hypertension     pt says taken off medication since has lost weight.  . Cancer 2011 [facility])    diagnosed in 2009 per pt. Invasive High grade Urothelial  carcinoma- s/p radiation and Chemo   . UTI (lower urinary tract infection)     Recurrent  . Malnutrition   . Leukopenia 12/06/2011    HIV serology negative  . Fatty liver     Medications:  Prescriptions prior to admission  Medication Sig Dispense Refill  . dexlansoprazole (DEXILANT) 60 MG capsule Take 60 mg by mouth daily.      Marland Kitchen docusate sodium (COLACE) 100 MG capsule Take 100 mg by mouth 2 (two) times daily.       Marland Kitchen HYDROcodone-acetaminophen (NORCO) 5-325 MG per tablet Take 1 tablet by mouth every 4 (four) hours as needed for pain.  45 tablet  2  . HYDROmorphone (DILAUDID) 2 MG tablet Take 2 mg by mouth every 6 (six) hours as needed for pain.      Marland Kitchen levothyroxine (SYNTHROID,  LEVOTHROID) 88 MCG tablet Take 1 tablet (88 mcg total) by mouth daily.  30 tablet  3  . Multiple Vitamin (MULITIVITAMIN WITH MINERALS) TABS Take 1 tablet by mouth every other day. On Tuesdays, Thursdays, Saturdays, and Sundays. **Administered at bedtime with TPN administration      . polyethylene glycol powder (GLYCOLAX/MIRALAX) powder Take 17 g by mouth daily.      . TPN NICU Inject into the vein continuous. Formula copy place in paper chart.   Lipids daily due to use of 3 in 1 bag.      . promethazine (PHENERGAN) 12.5 MG tablet Take 1 tablet (12.5 mg total) by mouth every 8 (eight) hours as needed for nausea.  10 tablet  0    Insulin Requirements in the past 24 hours:  none  Current Nutrition:  some soft foods  Assessment: 61yo female maintained chronically at home on cyclic TPN regimen compounded by [facility] pharmacy. Pt known to [place] from previous admission.   She has exhausted her home TPN supply & has been transitioned to hospital-supplied Clinimix over weekend.  This has been done during previous admissions & patient tolerated well. No issues noted overnight.  Electrolytes remain at goal.  Glucose <150.  Nutritional Goals:  To meet >/= 90% of estimated nutrition needs Kcal: 1425-1710 Protein:  68-75gm Fluid: > 1757ml/day  Plan:  Clinimix E 5/15 infused from 9pm-9am nightly.  Provide Lipids on Mon and Thur. Multivitamin 1 PO daily F/U TPN labs in am Change Levaquin & Protonix to po per policy  Elson Clan 10/11/2012,8:47 AM

## 2012-10-11 NOTE — Progress Notes (Signed)
  Subjective: Increased belching.  Occasionally belching bitter Fluid.  No BM today but small one last PM.  Still flatus.  NO increase in symptoms with PO.  Did complain of some increased pain last night.  No fever or chills.  Objective: Vital signs in last 24 hours: Temp:  [97.7 F (36.5 C)-98.1 F (36.7 C)] 98 F (36.7 C) (04/13 1513) Pulse Rate:  [58-72] 58 (04/13 1513) Resp:  [16-20] 16 (04/13 1513) BP: (104-136)/(65-85) 136/85 mmHg (04/13 1513) SpO2:  [97 %-100 %] 99 % (04/13 1513) Last BM Date: 10/11/12  Intake/Output from previous day: 04/12 0701 - 04/13 0700 In: 9389 [P.O.:720; I.V.:2649.2; IV Piggyback:300; TPN:5719.8] Out: 2100 [Urine:2100] Intake/Output this shift:    General appearance: alert and no distress Resp: clear to auscultation bilaterally GI: Intermittent bowel sounds.  Mild distention.  mild tenderness.  no peritoneal signs.    Lab Results:   Recent Labs  10/09/12 0541  WBC 2.8*  HGB 10.6*  HCT 30.3*  PLT 262   BMET  Recent Labs  10/10/12 0556 10/11/12 0614  NA 137 134*  K 3.9 4.2  CL 103 104  CO2 25 23  GLUCOSE 136* 111*  BUN 18 21  CREATININE 0.96 1.02  CALCIUM 8.3* 8.5   PT/INR No results found for this basename: LABPROT, INR,  in the last 72 hours ABG No results found for this basename: PHART, PCO2, PO2, HCO3,  in the last 72 hours  Studies/Results: No results found.  Anti-infectives: Anti-infectives   Start     Dose/Rate Route Frequency Ordered Stop   10/11/12 2200  levofloxacin (LEVAQUIN) tablet 500 mg     500 mg Oral Daily at bedtime 10/11/12 0854     10/10/12 0000  levofloxacin (LEVAQUIN) 500 MG tablet     500 mg Oral Daily 10/10/12 1022     10/08/12 1800  levofloxacin (LEVAQUIN) IVPB 500 mg  Status:  Discontinued     500 mg 100 mL/hr over 60 Minutes Intravenous Every 24 hours 10/08/12 1642 10/11/12 0853      Assessment/Plan: s/p * No surgery found * Partial SBO.  Continue clears/fulls for now.  Will check SBFT  in AM if no change.    LOS: 4 days    [name] C 10/11/2012

## 2012-10-12 ENCOUNTER — Inpatient Hospital Stay (HOSPITAL_COMMUNITY): Payer: Medicare Other

## 2012-10-12 LAB — COMPREHENSIVE METABOLIC PANEL
Alkaline Phosphatase: 185 U/L — ABNORMAL HIGH (ref 39–117)
BUN: 20 mg/dL (ref 6–23)
CO2: 22 mEq/L (ref 19–32)
Chloride: 106 mEq/L (ref 96–112)
Creatinine, Ser: 0.95 mg/dL (ref 0.50–1.10)
GFR calc Af Amer: 73 mL/min — ABNORMAL LOW (ref >90)
GFR calc non Af Amer: 63 mL/min — ABNORMAL LOW (ref >90)
Glucose, Bld: 110 mg/dL — ABNORMAL HIGH (ref 70–99)
Potassium: 3.8 mEq/L (ref 3.5–5.1)
Total Bilirubin: 0.3 mg/dL (ref 0.3–1.2)

## 2012-10-12 LAB — MAGNESIUM: Magnesium: 1.4 mg/dL — ABNORMAL LOW (ref 1.5–2.5)

## 2012-10-12 LAB — CBC
MCH: 26.4 pg (ref 26.0–34.0)
MCHC: 35.5 g/dL (ref 30.0–36.0)
RDW: 15.3 % (ref 11.5–15.5)

## 2012-10-12 LAB — DIFFERENTIAL
Basophils Absolute: 0 10*3/uL (ref 0.0–0.1)
Basophils Relative: 0 % (ref 0–1)
Eosinophils Absolute: 0.1 10*3/uL (ref 0.0–0.7)
Neutro Abs: 1.4 10*3/uL — ABNORMAL LOW (ref 1.7–7.7)
Neutrophils Relative %: 48 % (ref 43–77)

## 2012-10-12 LAB — PREALBUMIN: Prealbumin: 14.8 mg/dL — ABNORMAL LOW (ref 17.0–34.0)

## 2012-10-12 LAB — PHOSPHORUS: Phosphorus: 4.2 mg/dL (ref 2.3–4.6)

## 2012-10-12 LAB — TRIGLYCERIDES: Triglycerides: 47 mg/dL (ref <150)

## 2012-10-12 MED ORDER — MAGNESIUM SULFATE 40 MG/ML IJ SOLN
2.0000 g | Freq: Once | INTRAMUSCULAR | Status: AC
  Administered 2012-10-12: 2 g via INTRAVENOUS
  Filled 2012-10-12: qty 50

## 2012-10-12 MED ORDER — CLINIMIX E/DEXTROSE (5/15) 5 % IV SOLN
INTRAVENOUS | Status: AC
  Administered 2012-10-12: 23:00:00 via INTRAVENOUS
  Filled 2012-10-12: qty 2000

## 2012-10-12 MED ORDER — FAT EMULSION 20 % IV EMUL
250.0000 mL | INTRAVENOUS | Status: AC
  Administered 2012-10-12: 250 mL via INTRAVENOUS
  Filled 2012-10-12: qty 250

## 2012-10-12 NOTE — Progress Notes (Signed)
INTERVENTION: TPN per pharmacy  NUTRITION DIAGNOSIS: Inadequate oral intake; ongoing  Goal: Pt to meet >/= 90% of their estimated nutrition needs  Monitor:  nutrition support, Po intake, labs and wt trends  Reason for Assessment: Home TPN  62 y.o. female  Admitting c/o: abdominal pain, nausea   ASSESSMENT: Pt diet advanced to full liquids and tolerating well per RN. She is to have a  SBFT (short bowel follow through) today. Pt has requested TPN bag Clinimix 5/15 run until completed. Current rate 100 ml/hr. MVI provided.  Height: Ht Readings from Last 1 Encounters:  10/07/12 5\' 5"  (1.651 m)    Weight: Wt Readings from Last 1 Encounters:  10/07/12 164 lb 14.5 oz (74.8 kg)    Ideal Body Weight: 125# (56.8 kg)  % Ideal Body Weight: 132%  Wt Readings from Last 10 Encounters:  10/07/12 164 lb 14.5 oz (74.8 kg)  09/22/12 157 lb 1.9 oz (71.269 kg)  09/01/12 159 lb 6.3 oz (72.3 kg)  07/21/12 160 lb (72.576 kg)  05/26/12 163 lb 9.6 oz (74.208 kg)  05/21/12 160 lb 1.9 oz (72.63 kg)  05/01/12 164 lb 6.4 oz (74.571 kg)  03/27/12 158 lb (71.668 kg)  02/20/12 160 lb (72.576 kg)  02/18/12 159 lb 1.9 oz (72.176 kg)    Usual Body Weight: 160-165#  % Usual Body Weight: 100%  BMI:  Body mass index is 27.44 kg/(m^2).Overweight  Estimated Nutritional Needs: Kcal: 1425-1710  Protein: 68-75 gr  Fluid: >1700 ml/day   Skin: No issues noted  Diet Order: Full Liquid  EDUCATION NEEDS: -No education needs identified at this time   Intake/Output Summary (Last 24 hours) at 10/12/12 1154 Last data filed at 10/12/12 0649  Gross per 24 hour  Intake 8957.41 ml  Output   1500 ml  Net 7457.41 ml    Last BM: 10/08/12  Labs:   Recent Labs Lab 10/09/12 0541 10/10/12 0556 10/11/12 0614 10/12/12 0452  NA 137 137 134* 136  K 3.8 3.9 4.2 3.8  CL 103 103 104 106  CO2 25 25 23 22   BUN 20 18 21 20   CREATININE 1.06 0.96 1.02 0.95  CALCIUM 8.4 8.3* 8.5 8.8  MG 2.0  --    --  1.4*  PHOS 4.1  --   --  4.2  GLUCOSE 139* 136* 111* 110*    CBG (last 3)  No results found for this basename: GLUCAP,  in the last 72 hours  Scheduled Meds: . docusate sodium  100 mg Oral BID  . enoxaparin (LOVENOX) injection  40 mg Subcutaneous Q24H  . levofloxacin  500 mg Oral QHS  . multivitamin with minerals  1 tablet Oral Daily  . pantoprazole  40 mg Oral Daily  . polyethylene glycol  17 g Oral Daily    Continuous Infusions: . lactated ringers 110 mL/hr at 10/12/12 0845    Past Medical History  Diagnosis Date  . Bowel obstruction several    Recurrent SBO secondary to adhesions  . Hypothyroidism   . Acid reflux   . Chronic back pain   . Hyperlipidemia   . Hypertension     pt says taken off medication since has lost weight.  . Cancer 2011 [facility])    diagnosed in 2009 per pt. Invasive High grade Urothelial carcinoma- s/p radiation and Chemo   . UTI (lower urinary tract infection)     Recurrent  . Malnutrition   . Leukopenia 12/06/2011    HIV serology negative  .  Fatty liver     Past Surgical History  Procedure Laterality Date  . Back surgery      X2  . Hernia repair      mesh  . Abdominal surgery      with intestinal "puncture" x 2, exploratory surgery   . Cholecystectomy    . Ileo conduit      For bladder cancer  . Cystectomy      breast  . Portacath placement    . Bladder removal      Royann Shivers MS,RD,LDN,CSG Office: #[phone] Pager: [phone]

## 2012-10-12 NOTE — Progress Notes (Signed)
  Subjective: Feels better.  Still with some intermittent pain.  + bm yesterday.  Objective: Vital signs in last 24 hours: Temp:  [97.6 F (36.4 C)-97.8 F (36.6 C)] 97.8 F (36.6 C) (04/14 1400) Pulse Rate:  [59-66] 66 (04/14 1400) Resp:  [18-20] 18 (04/14 1400) BP: (120-124)/(77-80) 124/77 mmHg (04/14 1400) SpO2:  [98 %-99 %] 99 % (04/14 1400) Last BM Date: 10/11/12  Intake/Output from previous day: 04/13 0701 - 04/14 0700 In: 9317.4 [P.O.:840; I.V.:2690.3; TPN:5787.1] Out: 2700 [Urine:2700] Intake/Output this shift: Total I/O In: -  Out: 3700 [Urine:3700]  General appearance: alert and no distress GI: soft, non-tender; bowel sounds normal; no masses,  no organomegaly and Urosotomy functional  Lab Results:   Recent Labs  10/12/12 0452  WBC 2.8*  HGB 11.4*  HCT 32.1*  PLT 240   BMET  Recent Labs  10/11/12 0614 10/12/12 0452  NA 134* 136  K 4.2 3.8  CL 104 106  CO2 23 22  GLUCOSE 111* 110*  BUN 21 20  CREATININE 1.02 0.95  CALCIUM 8.5 8.8   PT/INR No results found for this basename: LABPROT, INR,  in the last 72 hours ABG No results found for this basename: PHART, PCO2, PO2, HCO3,  in the last 72 hours  Studies/Results: Dg Abd 1 View  10/12/2012  *RADIOLOGY REPORT*  Clinical Data: Evaluate for bowel obstruction.  ABDOMEN - 1 VIEW  Technique:  Following ingestion of a mixture of thin barium and EnteroVu, serial small bowel images were obtained including spot views of the terminal ileum.  Comparison: 10/07/2012  Findings: Marked small bowel dilatation identified which measures up to 7.4 cm.  There is a paucity of colonic gas.  No free intraperitoneal air identified.  IMPRESSION:  1.  Examination is positive for marked small bowel dilatation consistent with small bowel obstruction.   Original Report Authenticated By: Signa Kell, M.D.     Anti-infectives: Anti-infectives   Start     Dose/Rate Route Frequency Ordered Stop   10/11/12 2200  levofloxacin  (LEVAQUIN) tablet 500 mg     500 mg Oral Daily at bedtime 10/11/12 0854     10/10/12 0000  levofloxacin (LEVAQUIN) 500 MG tablet     500 mg Oral Daily 10/10/12 1022     10/08/12 1800  levofloxacin (LEVAQUIN) IVPB 500 mg  Status:  Discontinued     500 mg 100 mL/hr over 60 Minutes Intravenous Every 24 hours 10/08/12 1642 10/11/12 0853      Assessment/Plan: s/p * No surgery found * partial SBO.  Appears to be resolving slowly.  Will check SBFT to ensure no obvious transition point.  Continue to advance diet.  Pending radiographic findings, possible discharge in next 24 hours.  LOS: 5 days    Cameo Schmiesing C 10/12/2012

## 2012-10-12 NOTE — Progress Notes (Signed)
PARENTERAL NUTRITION CONSULT NOTE  Pharmacy Consult for TPN (chronic cyclic TPN from home) Indication: unable to tolerate full diet  Allergies  Allergen Reactions  . Bactrim (Sulfamethoxazole W-Trimethoprim)   . Codeine Hives  . Nitrofuran Derivatives Itching  . Penicillins Hives  . Sulfa Antibiotics Hives   Patient Measurements: Height: 5\' 5"  (165.1 cm) Weight: 164 lb 14.5 oz (74.8 kg) IBW/kg (Calculated) : 57 Usual body weight = 160-165#  Vital Signs: Temp: 97.6 F (36.4 C) (04/14 0521) BP: 120/80 mmHg (04/14 0521) Pulse Rate: 59 (04/14 0521) Intake/Output from previous day: 04/13 0701 - 04/14 0700 In: 9317.4 [P.O.:840; I.V.:2690.3; TPN:5787.1] Out: 2700 [Urine:2700] Intake/Output from this shift:    Labs:  Recent Labs  10/12/12 0452  WBC 2.8*  HGB 11.4*  HCT 32.1*  PLT 240     Recent Labs  10/10/12 0556 10/11/12 0614 10/12/12 0452 10/12/12 0453  NA 137 134* 136  --   K 3.9 4.2 3.8  --   CL 103 104 106  --   CO2 25 23 22   --   GLUCOSE 136* 111* 110*  --   BUN 18 21 20   --   CREATININE 0.96 1.02 0.95  --   CALCIUM 8.3* 8.5 8.8  --   MG  --   --  1.4*  --   PHOS  --   --  4.2  --   PROT  --   --  6.1  --   ALBUMIN  --   --  2.7*  --   AST  --   --  18  --   ALT  --   --  13  --   ALKPHOS  --   --  185*  --   BILITOT  --   --  0.3  --   TRIG  --   --   --  47  CHOL  --   --   --  103   Estimated Creatinine Clearance: 62.9 ml/min (by C-G formula based on Cr of 0.95).   No results found for this basename: GLUCAP,  in the last 72 hours  Medical History: Past Medical History  Diagnosis Date  . Bowel obstruction several    Recurrent SBO secondary to adhesions  . Hypothyroidism   . Acid reflux   . Chronic back pain   . Hyperlipidemia   . Hypertension     pt says taken off medication since has lost weight.  . Cancer 2011 [facility])    diagnosed in 2009 per pt. Invasive High grade Urothelial carcinoma- s/p radiation and Chemo   . UTI  (lower urinary tract infection)     Recurrent  . Malnutrition   . Leukopenia 12/06/2011    HIV serology negative  . Fatty liver     Medications:  Prescriptions prior to admission  Medication Sig Dispense Refill  . dexlansoprazole (DEXILANT) 60 MG capsule Take 60 mg by mouth daily.      Marland Kitchen docusate sodium (COLACE) 100 MG capsule Take 100 mg by mouth 2 (two) times daily.       Marland Kitchen HYDROcodone-acetaminophen (NORCO) 5-325 MG per tablet Take 1 tablet by mouth every 4 (four) hours as needed for pain.  45 tablet  2  . HYDROmorphone (DILAUDID) 2 MG tablet Take 2 mg by mouth every 6 (six) hours as needed for pain.      Marland Kitchen levothyroxine (SYNTHROID, LEVOTHROID) 88 MCG tablet Take 1 tablet (88 mcg total) by mouth daily.  30 tablet  3  . Multiple Vitamin (MULITIVITAMIN WITH MINERALS) TABS Take 1 tablet by mouth every other day. On Tuesdays, Thursdays, Saturdays, and Sundays. **Administered at bedtime with TPN administration      . polyethylene glycol powder (GLYCOLAX/MIRALAX) powder Take 17 g by mouth daily.      . TPN NICU Inject into the vein continuous. Formula copy place in paper chart.   Lipids daily due to use of 3 in 1 bag.      . promethazine (PHENERGAN) 12.5 MG tablet Take 1 tablet (12.5 mg total) by mouth every 8 (eight) hours as needed for nausea.  10 tablet  0    Insulin Requirements in the past 24 hours:  none  Current Nutrition:  some soft foods  Assessment: 61yo female maintained chronically at home on cyclic TPN regimen compounded by [facility] pharmacy. Pt known to [place] from previous admission.   She has exhausted her home TPN supply & has been transitioned to hospital-supplied Clinimix over weekend.  Patient is tolerating well.  Electrolytes remain at goal except magnesium is low today.  Glucose <150.  Nutritional Goals:  To meet >/= 90% of estimated nutrition needs Kcal: 1425-1710 Protein: 68-75gm Fluid: > 1732ml/day  Plan:  Clinimix E 5/15 infused from 9pm-9am nightly.   Provide Lipids on Mon and Thur. Multivitamin 1 PO daily Magnesium 2gm IVPB x1 today F/U TPN labs   Elson Clan 10/12/2012,2:41 PM

## 2012-10-12 NOTE — Progress Notes (Signed)
Spoke with Dr. Lovell Sheehan and he suggests that Dr. Leticia Penna did not put in any orders for discharge on this patient due to the study not being done. He suggests that she stay the night one more night and have the test done in the AM and pending the results she can be discharged in the morning.

## 2012-10-13 MED ORDER — LEVOFLOXACIN 500 MG PO TABS: 500.0000 mg | ORAL_TABLET | Freq: Every day | ORAL | Status: DC

## 2012-10-13 NOTE — Progress Notes (Signed)
Patient given discharge instructions with no questions. Flushed PICC line with 10cc of NS and changed cap as well as dressing. Patient taken out of facility by staff via wheelchair. Left with daughter in stable condition.

## 2012-10-14 ENCOUNTER — Other Ambulatory Visit: Payer: Self-pay | Admitting: Family Medicine

## 2012-10-19 ENCOUNTER — Encounter: Payer: Self-pay | Admitting: Family Medicine

## 2012-10-19 ENCOUNTER — Ambulatory Visit (INDEPENDENT_AMBULATORY_CARE_PROVIDER_SITE_OTHER): Payer: Medicare Other | Admitting: Family Medicine

## 2012-10-19 VITALS — BP 114/68 | HR 86 | Resp 18 | Wt 158.1 lb

## 2012-10-19 DIAGNOSIS — K56609 Unspecified intestinal obstruction, unspecified as to partial versus complete obstruction: Secondary | ICD-10-CM

## 2012-10-19 DIAGNOSIS — R109 Unspecified abdominal pain: Secondary | ICD-10-CM

## 2012-10-19 DIAGNOSIS — G8929 Other chronic pain: Secondary | ICD-10-CM

## 2012-10-19 DIAGNOSIS — N390 Urinary tract infection, site not specified: Secondary | ICD-10-CM

## 2012-10-19 MED ORDER — HYDROMORPHONE HCL 2 MG PO TABS: ORAL_TABLET | ORAL | Status: DC

## 2012-10-19 NOTE — Patient Instructions (Signed)
Keep previous follow-up appt Continue current medications Dilaudid refilled Be safe on Trip

## 2012-10-22 ENCOUNTER — Ambulatory Visit: Payer: Medicare Other | Admitting: Family Medicine

## 2012-10-25 NOTE — Progress Notes (Signed)
  Subjective:    Patient ID: [name], female    DOB: Sep 21, 1950, 62 y.o.   MRN: [id number]  HPI  Pt here to f/u admission for recurrent bowel obstruction and UTI. Doing well, no vomiting, eating soft foods only, TPN running without difficulty Leaving next week for trip with family No current abdominal pain  Review of Systems  GEN- denies fatigue, fever, weight loss,weakness, recent illness HEENT- denies eye drainage, change in vision, nasal discharge, CVS- denies chest pain, palpitations RESP- denies SOB, cough, wheeze ABD- denies N/V, change in stools, abd pain GU- denies dysuria, hematuria, dribbling, incontinence Neuro- denies headache, dizziness, syncope, seizure activity      Objective:   Physical Exam GEN- NAD, alert and oriented x3 HEENT-PERRL, EOMI, non icteric, MMM, oropharynx clear CVS- RRR, no murmur RESP-CTAB ABD- NABS, NT,ND ostomy in RLQ, no gaurding EXT- No edema Pulses- Radial 2+       Assessment & Plan:

## 2012-10-25 NOTE — Assessment & Plan Note (Signed)
Recent admission, resolved without intervention by general surgery.discussed avoiding specific foods, soft baby food consistency  only

## 2012-10-25 NOTE — Assessment & Plan Note (Signed)
Dilaudid tablets refilled

## 2012-10-25 NOTE — Assessment & Plan Note (Signed)
Recent UTI now resolved, no further antibiotics

## 2012-10-27 ENCOUNTER — Encounter: Payer: Self-pay | Admitting: *Deleted

## 2012-11-11 ENCOUNTER — Telehealth: Payer: Self-pay | Admitting: Family Medicine

## 2012-11-11 NOTE — Telephone Encounter (Signed)
Patient states that nurse in unable to get blood return when she comes to flush.  Picc line is in Left arm and she needs an order sent over to the specialty clinic. Needs to be declotted.

## 2012-11-11 NOTE — Telephone Encounter (Signed)
Order should say [facility] Protocol for occluded picc line.  Setup appt for patient to go @ 12 on 11/12/2012

## 2012-11-11 NOTE — Telephone Encounter (Signed)
Please write order for line and complete the heme/onc order form I will sign

## 2012-11-12 ENCOUNTER — Encounter (HOSPITAL_COMMUNITY): Payer: Medicare Other | Attending: Family Medicine

## 2012-11-12 DIAGNOSIS — T82898A Other specified complication of vascular prosthetic devices, implants and grafts, initial encounter: Secondary | ICD-10-CM | POA: Insufficient documentation

## 2012-11-12 MED ORDER — SODIUM CHLORIDE 0.9 % IJ SOLN
10.0000 mL | INTRAMUSCULAR | Status: DC | PRN
  Administered 2012-11-12 (×2): 10 mL via INTRAVENOUS

## 2012-11-12 MED ORDER — ALTEPLASE 2 MG IJ SOLR
INTRAMUSCULAR | Status: AC
  Filled 2012-11-12: qty 2

## 2012-11-12 MED ORDER — HEPARIN SOD (PORK) LOCK FLUSH 100 UNIT/ML IV SOLN
INTRAVENOUS | Status: AC
  Filled 2012-11-12: qty 5

## 2012-11-12 MED ORDER — ALTEPLASE 2 MG IJ SOLR
2.0000 mg | Freq: Once | INTRAMUSCULAR | Status: AC
  Administered 2012-11-12: 2 mg

## 2012-11-12 MED ORDER — HEPARIN SOD (PORK) LOCK FLUSH 100 UNIT/ML IV SOLN
500.0000 [IU] | Freq: Once | INTRAVENOUS | Status: AC
  Administered 2012-11-12: 500 [IU] via INTRAVENOUS

## 2012-11-12 NOTE — Telephone Encounter (Signed)
Order written and faxed.

## 2012-11-12 NOTE — Progress Notes (Signed)
Attempted to withdraw blood from left upper arm PICC line x several attempts. No blood return. Line flushes easily with resistance,pain or discomfort. Instilled 2 ml Alteplase at 12 noon per order and protocol. Cap covered with STOP/DO NOT FLUSH dressing. Pt states she is going for a mammogram and will come back to have Alteplase removed in 1-2 hours. Instructions given to pt that PICC line is NOT to be used until Alteplase is removed. Verbalized understanding. 1400 Pt returned to clinic. Accessed left upper arm PICC line, easily withdrew Alteplase and 10 ml blood and discarded. Easily flushed PICC line with 20 ml NS and 300 units Heparin. Pt tolerated well.

## 2012-12-28 ENCOUNTER — Ambulatory Visit (INDEPENDENT_AMBULATORY_CARE_PROVIDER_SITE_OTHER): Payer: Medicare Other | Admitting: Family Medicine

## 2012-12-28 ENCOUNTER — Ambulatory Visit: Payer: Medicare Other | Admitting: Family Medicine

## 2012-12-28 ENCOUNTER — Encounter: Payer: Self-pay | Admitting: Family Medicine

## 2012-12-28 VITALS — BP 110/80 | HR 80 | Temp 97.0°F | Resp 20 | Wt 157.0 lb

## 2012-12-28 DIAGNOSIS — R238 Other skin changes: Secondary | ICD-10-CM | POA: Insufficient documentation

## 2012-12-28 DIAGNOSIS — K7689 Other specified diseases of liver: Secondary | ICD-10-CM

## 2012-12-28 DIAGNOSIS — E039 Hypothyroidism, unspecified: Secondary | ICD-10-CM

## 2012-12-28 DIAGNOSIS — L989 Disorder of the skin and subcutaneous tissue, unspecified: Secondary | ICD-10-CM

## 2012-12-28 DIAGNOSIS — G8929 Other chronic pain: Secondary | ICD-10-CM

## 2012-12-28 DIAGNOSIS — R109 Unspecified abdominal pain: Secondary | ICD-10-CM

## 2012-12-28 DIAGNOSIS — I10 Essential (primary) hypertension: Secondary | ICD-10-CM

## 2012-12-28 DIAGNOSIS — R7989 Other specified abnormal findings of blood chemistry: Secondary | ICD-10-CM

## 2012-12-28 DIAGNOSIS — R7309 Other abnormal glucose: Secondary | ICD-10-CM

## 2012-12-28 DIAGNOSIS — R739 Hyperglycemia, unspecified: Secondary | ICD-10-CM | POA: Insufficient documentation

## 2012-12-28 DIAGNOSIS — K760 Fatty (change of) liver, not elsewhere classified: Secondary | ICD-10-CM

## 2012-12-28 LAB — CBC
Hemoglobin: 11.7 g/dL — ABNORMAL LOW (ref 12.0–15.0)
MCH: 26.3 pg (ref 26.0–34.0)
MCV: 77.5 fL — ABNORMAL LOW (ref 78.0–100.0)
RBC: 4.45 MIL/uL (ref 3.87–5.11)

## 2012-12-28 MED ORDER — LEVOTHYROXINE SODIUM 88 MCG PO TABS: ORAL_TABLET | ORAL | Status: DC

## 2012-12-28 MED ORDER — HYDROMORPHONE HCL 2 MG PO TABS: ORAL_TABLET | ORAL | Status: DC

## 2012-12-28 MED ORDER — DEXLANSOPRAZOLE 60 MG PO CPDR: 60.0000 mg | DELAYED_RELEASE_CAPSULE | Freq: Every day | ORAL | Status: DC

## 2012-12-28 NOTE — Assessment & Plan Note (Signed)
Well controlled, no meds

## 2012-12-28 NOTE — Patient Instructions (Addendum)
Medications refilled Use diaper cream or barrier cream Labs today Pain medications refilled F/U 3 months

## 2012-12-28 NOTE — Assessment & Plan Note (Signed)
abd pain chronic due to recurrent UTI and SBO, dilaudid refilled

## 2012-12-28 NOTE — Assessment & Plan Note (Signed)
Repeat LFT

## 2012-12-28 NOTE — Progress Notes (Signed)
  Subjective:    Patient ID: [name], female    DOB: 1950/07/30, 62 y.o.   MRN: [id number]  HPI  Pt here to f/u chronic medical problems. Noticed irritated skin beneath her colostomy over the weekend. No current abdominal pain. Was seen by surgeon at [facility] advised surgery not an option for her bowels. Told to stick to baby food puree.  Needs refill on pain medication Due for repeat thyroid studies and A1C. TPN unchanged   Review of Systems - per above  GEN- denies fatigue, fever, weight loss,weakness, recent illness HEENT- denies eye drainage, change in vision, nasal discharge, CVS- denies chest pain, palpitations RESP- denies SOB, cough, wheeze ABD- denies N/V, change in stools, abd pain GU- denies dysuria, hematuria, dribbling, incontinence MSK- denies joint pain, muscle aches, injury Neuro- denies headache, dizziness, syncope, seizure activity       Objective:   Physical Exam GEN- NAD, alert and oriented x3 HEENT-PERRL, EOMI, non icteric, MMM, oropharynx clear NECK-no thyromegaly, supple CVS- RRR, no murmur RESP-CTAB ABD- NABS, NT,ND ostomy in RLQ, no gaurding Skin- erythema beneath adhesive, no blisters, no papules EXT- No edema Pulses- Radial 2+       Assessment & Plan:

## 2012-12-28 NOTE — Assessment & Plan Note (Signed)
Check TFT  

## 2012-12-28 NOTE — Assessment & Plan Note (Signed)
Long term use TPN, check A1C

## 2012-12-28 NOTE — Assessment & Plan Note (Signed)
She likley had some leaking around bag, and adhesive is rubbing. Will have her use a barrier cream or desitin and fix bag to not cover irritated area

## 2012-12-28 NOTE — Assessment & Plan Note (Signed)
Fatty liver and TPN, repeat LFT

## 2012-12-29 LAB — COMPREHENSIVE METABOLIC PANEL
BUN: 27 mg/dL — ABNORMAL HIGH (ref 6–23)
CO2: 27 mEq/L (ref 19–32)
Creat: 0.81 mg/dL (ref 0.50–1.10)
Glucose, Bld: 98 mg/dL (ref 70–99)
Total Bilirubin: 0.4 mg/dL (ref 0.3–1.2)

## 2012-12-29 LAB — HEMOGLOBIN A1C: Hgb A1c MFr Bld: 5.2 % (ref <5.7)

## 2013-01-06 ENCOUNTER — Ambulatory Visit (INDEPENDENT_AMBULATORY_CARE_PROVIDER_SITE_OTHER): Payer: Medicare Other | Admitting: Physician Assistant

## 2013-01-06 ENCOUNTER — Encounter: Payer: Self-pay | Admitting: Physician Assistant

## 2013-01-06 VITALS — BP 120/84 | HR 72 | Temp 97.3°F | Resp 18 | Ht 64.0 in | Wt 155.0 lb

## 2013-01-06 DIAGNOSIS — R300 Dysuria: Secondary | ICD-10-CM

## 2013-01-06 DIAGNOSIS — N390 Urinary tract infection, site not specified: Secondary | ICD-10-CM

## 2013-01-06 LAB — URINALYSIS, ROUTINE W REFLEX MICROSCOPIC
Bilirubin Urine: NEGATIVE
Glucose, UA: NEGATIVE mg/dL
Ketones, ur: NEGATIVE mg/dL
Specific Gravity, Urine: 1.02 (ref 1.005–1.030)
Urobilinogen, UA: 0.2 mg/dL (ref 0.0–1.0)
pH: 8.5 — ABNORMAL HIGH (ref 5.0–8.0)

## 2013-01-06 LAB — URINALYSIS, MICROSCOPIC ONLY: Casts: NONE SEEN

## 2013-01-06 MED ORDER — CIPROFLOXACIN HCL 500 MG PO TABS: 500.0000 mg | ORAL_TABLET | Freq: Two times a day (BID) | ORAL | Status: DC

## 2013-01-06 NOTE — Progress Notes (Signed)
Patient ID: MARCOS PELOSO MRN: [id number], DOB: 01/09/1951, 62 y.o. Date of Encounter: 01/06/2013, 10:21 AM    Chief Complaint:  Chief Complaint  Patient presents with  . c/o UTI    bloody/cloudy urine     HPI: 61 y.o. year old AA female reports that she first noticed this on Sunday 01/03/13. At that time her urine bag was "red' with blood. Since then, it has not been red or even pink but it has been very cloudy. She has had no pain/pressure in abdomen. No pain in back. No fever/chills. She says tha t"ever since she has had this bag she keeps infecton. Says that they have tried multiple abx in past and one always works. Seh gets her daughter out of waiting room, who says it is cipro that always works.      Home Meds: See attached medication section for any medications that were entered at today's visit. The computer does not put those onto this list.The following list is a list of meds entered prior to today's visit.   Current Outpatient Prescriptions on File Prior to Visit  Medication Sig Dispense Refill  . dexlansoprazole (DEXILANT) 60 MG capsule Take 1 capsule (60 mg total) by mouth daily.  30 capsule  3  . docusate sodium (COLACE) 100 MG capsule Take 100 mg by mouth 2 (two) times daily.       Marland Kitchen HYDROmorphone (DILAUDID) 2 MG tablet 1/2 to 1 tablet every 6 hours as needed  45 tablet  0  . levothyroxine (SYNTHROID, LEVOTHROID) 88 MCG tablet TAKE 1 TABLET BY MOUTH EVERY DAY  30 tablet  3  . Multiple Vitamin (MULITIVITAMIN WITH MINERALS) TABS Take 1 tablet by mouth every other day. On Tuesdays, Thursdays, Saturdays, and Sundays. **Administered at bedtime with TPN administration      . polyethylene glycol powder (GLYCOLAX/MIRALAX) powder Take 17 g by mouth daily.      . promethazine (PHENERGAN) 12.5 MG tablet Take 1 tablet (12.5 mg total) by mouth every 8 (eight) hours as needed for nausea.  10 tablet  0  . TPN NICU Inject into the vein continuous. Formula copy place in paper chart.    Lipids daily due to use of 3 in 1 bag.       No current facility-administered medications on file prior to visit.    Allergies:  Allergies  Allergen Reactions  . Bactrim (Sulfamethoxazole W-Trimethoprim)   . Codeine Hives  . Nitrofuran Derivatives Itching  . Penicillins Hives  . Sulfa Antibiotics Hives      Review of Systems: See HPI for pertinent ROS. All other ROS negative.    Physical Exam: Blood pressure 120/84, pulse 72, temperature 97.3 F (36.3 C), temperature source Oral, resp. rate 18, height 5\' 4"  (1.626 m), weight 155 lb (70.308 kg)., Body mass index is 26.59 kg/(m^2). General: WNWD AAF.  Appears in no acute distress. Lungs: Clear bilaterally to auscultation without wheezes, rales, or rhonchi. Breathing is unlabored. Heart: Regular rhythm. No murmurs, rubs, or gallops. Msk:  Strength and tone normal for age. Back: No costophrenic angle tenderness with percussion bilaterally. Neuro: Alert and oriented X 3. Moves all extremities spontaneously. Gait is normal. CNII-XII grossly in tact. Psych:  Responds to questions appropriately with a normal affect.     ASSESSMENT AND PLAN:  62 y.o. year old female with  1. UTI (urinary tract infection) - ciprofloxacin (CIPRO) 500 MG tablet; Take 1 tablet (500 mg total) by mouth 2 (two) times daily.  Dispense:  14 tablet; Refill: 0 Will send culture also. She has been on cipro often. Will make sur eshe has not develped resistance. Also, she is allergic to multiple antibiotics.  Note: She has cysto bag and UA sample was collected from bag.   2. Dysuria - Urinalysis, Routine w reflex microscopic   Signed, [address] [place], [place], [facility] 01/06/2013 10:21 AM

## 2013-01-06 NOTE — Addendum Note (Signed)
Addended by: WRAY, [place] on: 01/06/2013 12:22 PM   Modules accepted: Orders

## 2013-01-10 ENCOUNTER — Emergency Department (HOSPITAL_COMMUNITY)
Admission: EM | Admit: 2013-01-10 | Discharge: 2013-01-11 | Disposition: A | Discharge status: Home / Self Care | Payer: Medicare Other | Attending: Emergency Medicine | Admitting: Emergency Medicine

## 2013-01-10 DIAGNOSIS — Z880 Allergy status to penicillin: Secondary | ICD-10-CM | POA: Insufficient documentation

## 2013-01-10 DIAGNOSIS — Z87891 Personal history of nicotine dependence: Secondary | ICD-10-CM | POA: Insufficient documentation

## 2013-01-10 DIAGNOSIS — Z8551 Personal history of malignant neoplasm of bladder: Secondary | ICD-10-CM | POA: Insufficient documentation

## 2013-01-10 DIAGNOSIS — R109 Unspecified abdominal pain: Secondary | ICD-10-CM

## 2013-01-10 DIAGNOSIS — R1110 Vomiting, unspecified: Secondary | ICD-10-CM | POA: Insufficient documentation

## 2013-01-10 DIAGNOSIS — Z8639 Personal history of other endocrine, nutritional and metabolic disease: Secondary | ICD-10-CM | POA: Insufficient documentation

## 2013-01-10 DIAGNOSIS — Z79899 Other long term (current) drug therapy: Secondary | ICD-10-CM | POA: Insufficient documentation

## 2013-01-10 DIAGNOSIS — E039 Hypothyroidism, unspecified: Secondary | ICD-10-CM | POA: Insufficient documentation

## 2013-01-10 DIAGNOSIS — Z792 Long term (current) use of antibiotics: Secondary | ICD-10-CM | POA: Insufficient documentation

## 2013-01-10 DIAGNOSIS — I10 Essential (primary) hypertension: Secondary | ICD-10-CM | POA: Insufficient documentation

## 2013-01-10 DIAGNOSIS — N390 Urinary tract infection, site not specified: Secondary | ICD-10-CM | POA: Insufficient documentation

## 2013-01-10 DIAGNOSIS — Z862 Personal history of diseases of the blood and blood-forming organs and certain disorders involving the immune mechanism: Secondary | ICD-10-CM | POA: Insufficient documentation

## 2013-01-10 DIAGNOSIS — K219 Gastro-esophageal reflux disease without esophagitis: Secondary | ICD-10-CM | POA: Insufficient documentation

## 2013-01-10 DIAGNOSIS — R0602 Shortness of breath: Secondary | ICD-10-CM | POA: Insufficient documentation

## 2013-01-10 DIAGNOSIS — Z8719 Personal history of other diseases of the digestive system: Secondary | ICD-10-CM | POA: Insufficient documentation

## 2013-01-10 LAB — URINE CULTURE

## 2013-01-11 ENCOUNTER — Encounter (HOSPITAL_COMMUNITY): Payer: Self-pay

## 2013-01-11 MED ORDER — MORPHINE SULFATE 4 MG/ML IJ SOLN
4.0000 mg | Freq: Once | INTRAMUSCULAR | Status: AC
  Administered 2013-01-11: 4 mg via INTRAVENOUS
  Filled 2013-01-11: qty 1

## 2013-01-11 MED ORDER — SODIUM CHLORIDE 0.9 % IV BOLUS (SEPSIS)
1000.0000 mL | Freq: Once | INTRAVENOUS | Status: AC
  Administered 2013-01-11: 1000 mL via INTRAVENOUS

## 2013-01-11 MED ORDER — ONDANSETRON HCL 4 MG/2ML IJ SOLN
4.0000 mg | Freq: Once | INTRAMUSCULAR | Status: AC
  Administered 2013-01-11: 4 mg via INTRAVENOUS
  Filled 2013-01-11: qty 2

## 2013-01-11 MED ORDER — SODIUM CHLORIDE 0.9 % IV SOLN
Freq: Once | INTRAVENOUS | Status: AC
  Administered 2013-01-11: 01:00:00 via INTRAVENOUS

## 2013-01-11 MED ORDER — MORPHINE SULFATE 4 MG/ML IJ SOLN
2.0000 mg | Freq: Once | INTRAMUSCULAR | Status: AC
  Administered 2013-01-11: 2 mg via INTRAVENOUS
  Filled 2013-01-11: qty 1

## 2013-01-11 NOTE — ED Provider Notes (Signed)
History    CSN: [id number] Arrival date & time 01/10/13  2326  First MD Initiated Contact with Patient 01/11/13 0002     Chief Complaint  Patient presents with  . Abdominal Pain  . Emesis   (Consider location/radiation/quality/duration/timing/severity/associated sxs/prior Treatment) HPI HPI Comments: [name] is a 62 y.o. female with  A persistent SBO on TPN x 22 months who presents to the Emergency Department complaining of abdominal pain with induced vomiting. She is currently on an antibiotic for a UTI. She has SBO due to cystectomy and ileal conduit for bladder cancer. She is s/p chemo and radiation therapy . She is followed at [facility]. She is not a surgical candidate for the SBO due to the multiple surgeries to her abdomen.She reports abdominal cramping for several days. When she induced vomiting it clears the pain for a while.   PCP Dr. [name]   Past Medical History  Diagnosis Date  . Bowel obstruction several    Recurrent SBO secondary to adhesions  . Hypothyroidism   . Acid reflux   . Chronic back pain   . Hyperlipidemia   . Hypertension     pt says taken off medication since has lost weight.  . Cancer 2011 [facility])    diagnosed in 2009 per pt. Invasive High grade Urothelial carcinoma- s/p radiation and Chemo   . UTI (lower urinary tract infection)     Recurrent  . Malnutrition   . Leukopenia 12/06/2011    HIV serology negative  . Fatty liver    Past Surgical History  Procedure Laterality Date  . Back surgery      X2  . Hernia repair      mesh  . Abdominal surgery      with intestinal "puncture" x 2, exploratory surgery   . Cholecystectomy    . Ileo conduit      For bladder cancer  . Cystectomy      breast  . Portacath placement    . Bladder removal     Family History  Problem Relation Age of Onset  . Heart disease Mother     enlarged  . Hypertension Mother   . Diabetes Father   . Diabetes Sister   . Diabetes Maternal Grandmother   .  Colon cancer Neg Hx    History  Substance Use Topics  . Smoking status: Former Smoker    Quit date: 09/26/2010  . Smokeless tobacco: Not on file  . Alcohol Use: No   OB History   Grav Para Term Preterm Abortions TAB SAB Ect Mult Living                 Review of Systems  Constitutional: Negative for fever.       10 Systems reviewed and are negative for acute change except as noted in the HPI.  HENT: Negative for congestion.   Eyes: Negative for discharge and redness.  Respiratory: Negative for cough and shortness of breath.   Cardiovascular: Negative for chest pain.  Gastrointestinal: Positive for vomiting and abdominal pain.  Musculoskeletal: Negative for back pain.  Skin: Negative for rash.  Neurological: Negative for syncope, numbness and headaches.  Psychiatric/Behavioral:       No behavior change.    Allergies  Bactrim; Codeine; Nitrofuran derivatives; Penicillins; and Sulfa antibiotics  Home Medications   Current Outpatient Rx  Name  Route  Sig  Dispense  Refill  . ciprofloxacin (CIPRO) 500 MG tablet   Oral   Take  1 tablet (500 mg total) by mouth 2 (two) times daily.   14 tablet   0   . dexlansoprazole (DEXILANT) 60 MG capsule   Oral   Take 1 capsule (60 mg total) by mouth daily.   30 capsule   3   . docusate sodium (COLACE) 100 MG capsule   Oral   Take 100 mg by mouth 2 (two) times daily.          Marland Kitchen HYDROmorphone (DILAUDID) 2 MG tablet      1/2 to 1 tablet every 6 hours as needed   45 tablet   0   . levothyroxine (SYNTHROID, LEVOTHROID) 88 MCG tablet      TAKE 1 TABLET BY MOUTH EVERY DAY   30 tablet   3   . Multiple Vitamin (MULITIVITAMIN WITH MINERALS) TABS   Oral   Take 1 tablet by mouth every other day. On Tuesdays, Thursdays, Saturdays, and Sundays. **Administered at bedtime with TPN administration         . polyethylene glycol powder (GLYCOLAX/MIRALAX) powder   Oral   Take 17 g by mouth daily.         . promethazine (PHENERGAN)  12.5 MG tablet   Oral   Take 1 tablet (12.5 mg total) by mouth every 8 (eight) hours as needed for nausea.   10 tablet   0   . TPN NICU   Intravenous   Inject into the vein continuous. Formula copy place in paper chart.   Lipids daily due to use of 3 in 1 bag.          There were no vitals taken for this visit. Physical Exam  Nursing note and vitals reviewed. Constitutional: She appears well-developed and well-nourished.  Awake, alert, nontoxic appearance.  HENT:  Head: Normocephalic and atraumatic.  Eyes: EOM are normal. Pupils are equal, round, and reactive to light.  Neck: Neck supple.  Cardiovascular: Normal rate and intact distal pulses.   Pulmonary/Chest: Effort normal and breath sounds normal. She exhibits no tenderness.  Abdominal: Soft. Bowel sounds are normal. There is no tenderness. There is no rebound.  No frank tenderness with palpation.  Musculoskeletal: She exhibits no tenderness.  Baseline ROM, no obvious new focal weakness.  Neurological:  Mental status and motor strength appears baseline for patient and situation.  Skin: No rash noted.  PICC line in right upper arm. TPN running.  Psychiatric: She has a normal mood and affect.    ED Course  Procedures (including critical care time) [phone] Patient has received analgesics x 2 with fair relief of pain. Patient ready for discharge.She will follow up with PCP and [facility]. MDM  Patient with chronic SBO due to adhesions, here with abdominal pain. She has been given analgesics x 2 with some relief. Pt stable in ED with no significant deterioration in condition.The patient appears reasonably screened and/or stabilized for discharge and I doubt any other medical condition or other [facility] requiring further screening, evaluation, or treatment in the ED at this time prior to discharge.  MDM Reviewed: nursing note and vitals     Nicoletta Dress. Colon Branch, MD 01/11/13 [phone]

## 2013-01-11 NOTE — ED Notes (Signed)
Pt with known bowel obstruction and is on tpn d/t same.  Pt has been having abd pain and vomiting.  Pt states she is on antibiotics for uti at this time.

## 2013-01-22 ENCOUNTER — Ambulatory Visit (INDEPENDENT_AMBULATORY_CARE_PROVIDER_SITE_OTHER): Payer: Medicare Other | Admitting: Family Medicine

## 2013-01-22 ENCOUNTER — Encounter: Payer: Self-pay | Admitting: Family Medicine

## 2013-01-22 VITALS — BP 122/88 | HR 88 | Temp 96.5°F | Resp 18 | Wt 158.0 lb

## 2013-01-22 DIAGNOSIS — K56609 Unspecified intestinal obstruction, unspecified as to partial versus complete obstruction: Secondary | ICD-10-CM

## 2013-01-22 DIAGNOSIS — N390 Urinary tract infection, site not specified: Secondary | ICD-10-CM

## 2013-01-22 NOTE — Patient Instructions (Addendum)
Do not eat anything solid Do broth and liquids  Please get discharge summary from [facility] and labs F/U as previous

## 2013-01-24 NOTE — Progress Notes (Signed)
  Subjective:    Patient ID: [name], female    DOB: [birth year], 62 y.o.   MRN: [id number]  HPI  Pt here to f/u hospilization for recurrent SBO, admitted to [facility] for 4 days, had difficulty with NG tube. Was not treated with any antibiotics that she was aware of. Has follow-up with her Gyn/oncologist next week. Since discharge had some N/V yesterday and increased loose stools. Today has had some abd pain, but admits to eating solid foods and meats on Sunday and Monday.  States she eats because she is "so hungry" and thinks her abdominal pain is due to hunger pains, even though she knows food will get stuck    Review of Systems  GEN- denies fatigue, fever, weight loss,weakness, recent illness HEENT- denies eye drainage, change in vision, nasal discharge, CVS- denies chest pain, palpitations RESP- denies SOB, cough, wheeze ABD- + N/V, change in stools, +abd pain       Objective:   Physical Exam GEN- NAD, alert and oriented x3 HEENT- PERRL, EOMI, non injected sclera, pink conjunctiva, MMM, oropharynx clear CVS- RRR, no murmur RESP-CTAB ABD- Hyperactive BS, SOFT, ND, NT, ostomy in tact EXT- No edema Pulses- Radial 2+        Assessment & Plan:

## 2013-01-24 NOTE — Assessment & Plan Note (Signed)
Recurrent SBO, unfortunately she does not take the advice given to her by myself or her specialist at [facility] regarding what foods she can eat. Advised liquid diet for next few days, has nausea medication,no solid foods I think she may have a partial SBO currently

## 2013-01-24 NOTE — Assessment & Plan Note (Signed)
Treated with Cipro 2 weeks, ago enterobacter/enteroccous seen, repeat urine culture done She is definitely colonized

## 2013-02-03 NOTE — Telephone Encounter (Signed)
error 

## 2013-02-12 ENCOUNTER — Encounter: Payer: Self-pay | Admitting: Family Medicine

## 2013-02-12 NOTE — Telephone Encounter (Signed)
Can you make erroneous please? Sorry!

## 2013-02-12 NOTE — Telephone Encounter (Signed)
This encounter was created in error - please disregard.

## 2013-02-23 ENCOUNTER — Telehealth: Payer: Self-pay | Admitting: Family Medicine

## 2013-02-23 MED ORDER — PROMETHAZINE HCL 12.5 MG PO TABS: 12.5000 mg | ORAL_TABLET | Freq: Three times a day (TID) | ORAL | Status: DC | PRN

## 2013-02-23 NOTE — Telephone Encounter (Signed)
Med refilled.

## 2013-02-23 NOTE — Telephone Encounter (Signed)
Polyethylene Glycol 3350 Powd mix 1 capful in liquid QD #527.0 gm

## 2013-02-24 ENCOUNTER — Other Ambulatory Visit: Payer: Self-pay | Admitting: Family Medicine

## 2013-02-26 ENCOUNTER — Ambulatory Visit (INDEPENDENT_AMBULATORY_CARE_PROVIDER_SITE_OTHER): Payer: Medicare Other | Admitting: Family Medicine

## 2013-02-26 VITALS — BP 120/70 | HR 70 | Temp 97.3°F | Resp 18 | Wt 152.0 lb

## 2013-02-26 DIAGNOSIS — J029 Acute pharyngitis, unspecified: Secondary | ICD-10-CM

## 2013-02-26 DIAGNOSIS — J069 Acute upper respiratory infection, unspecified: Secondary | ICD-10-CM

## 2013-02-26 DIAGNOSIS — H109 Unspecified conjunctivitis: Secondary | ICD-10-CM

## 2013-02-26 DIAGNOSIS — F17200 Nicotine dependence, unspecified, uncomplicated: Secondary | ICD-10-CM

## 2013-02-26 LAB — RAPID STREP SCREEN (MED CTR MEBANE ONLY)

## 2013-02-26 MED ORDER — HYDROMORPHONE HCL 2 MG PO TABS: ORAL_TABLET | ORAL | Status: DC

## 2013-02-26 MED ORDER — ERYTHROMYCIN 5 MG/GM OP OINT: TOPICAL_OINTMENT | Freq: Every day | OPHTHALMIC | Status: DC

## 2013-02-26 MED ORDER — AZITHROMYCIN 250 MG PO TABS: ORAL_TABLET | ORAL | Status: DC

## 2013-02-26 MED ORDER — ALBUTEROL SULFATE HFA 108 (90 BASE) MCG/ACT IN AERS: 2.0000 | INHALATION_SPRAY | Freq: Four times a day (QID) | RESPIRATORY_TRACT | Status: DC | PRN

## 2013-02-26 NOTE — Patient Instructions (Addendum)
Start antibiotics Start robitussin for cough- plain Use the eye gel as prescribed Use the albuterol as needed F/U as previous

## 2013-03-01 ENCOUNTER — Encounter: Payer: Self-pay | Admitting: Family Medicine

## 2013-03-01 DIAGNOSIS — H109 Unspecified conjunctivitis: Secondary | ICD-10-CM | POA: Insufficient documentation

## 2013-03-01 DIAGNOSIS — J069 Acute upper respiratory infection, unspecified: Secondary | ICD-10-CM | POA: Insufficient documentation

## 2013-03-01 NOTE — Assessment & Plan Note (Signed)
Pt with worsening URI symptoms, as she is a smoker and somewhat immunocompromised Will place her on Zpak Robitussin ALbuterol inhaler prn If no improvement CXR

## 2013-03-01 NOTE — Assessment & Plan Note (Signed)
Will apply erythromycin ointment

## 2013-03-01 NOTE — Progress Notes (Signed)
  Subjective:    Patient ID: [name], female    DOB: September 16, 1950, 62 y.o.   MRN: [id number]  HPI  Pt here with cough with production for past week, eye drainage bilaterally. No known sick contacts. Cough associated with sore throat and occasional wheeze if she gets into a fit. Continues to smoke a few cig a day. Denies fever. Eye drainage for the past few days, has yellow pus crusting both eyes R>L .  Review of Systems   GEN- denies fatigue, fever, weight loss,weakness, recent illness HEENT- denies eye drainage, change in vision, nasal discharge, CVS- denies chest pain, palpitations RESP- denies SOB,+ cough, +wheeze ABD- denies N/V, change in stools, abd pain Neuro- denies headache, dizziness, syncope, seizure activity      Objective:   Physical Exam  GEN- NAD, alert and oriented x3 HEENT- PERRL, EOMI, non injected sclera, injected conjunctiva Right, MMM, oropharynx mild injection, TM clear bilat no effusion, no maxillary sinus tenderness, nares clear Neck- Supple, no LAD CVS- RRR, no murmur RESP-course rhonchi, few forced expiratory wheeze ABD-NABS,soft,NT,ND,urostomy bag in tact EXT- No edema Pulses- Radial 2+         Assessment & Plan:

## 2013-03-01 NOTE — Assessment & Plan Note (Signed)
Counseled on importance of cessation 

## 2013-03-03 ENCOUNTER — Encounter (HOSPITAL_COMMUNITY): Payer: Self-pay

## 2013-03-03 ENCOUNTER — Emergency Department (HOSPITAL_COMMUNITY): Payer: Medicare Other

## 2013-03-03 ENCOUNTER — Emergency Department (HOSPITAL_COMMUNITY)
Admission: EM | Admit: 2013-03-03 | Discharge: 2013-03-03 | Disposition: A | Discharge status: Home / Self Care | Payer: Medicare Other | Attending: Emergency Medicine | Admitting: Emergency Medicine

## 2013-03-03 ENCOUNTER — Telehealth: Payer: Self-pay | Admitting: Family Medicine

## 2013-03-03 DIAGNOSIS — I10 Essential (primary) hypertension: Secondary | ICD-10-CM | POA: Insufficient documentation

## 2013-03-03 DIAGNOSIS — Z862 Personal history of diseases of the blood and blood-forming organs and certain disorders involving the immune mechanism: Secondary | ICD-10-CM | POA: Insufficient documentation

## 2013-03-03 DIAGNOSIS — Z87891 Personal history of nicotine dependence: Secondary | ICD-10-CM | POA: Insufficient documentation

## 2013-03-03 DIAGNOSIS — Z9221 Personal history of antineoplastic chemotherapy: Secondary | ICD-10-CM | POA: Insufficient documentation

## 2013-03-03 DIAGNOSIS — Z792 Long term (current) use of antibiotics: Secondary | ICD-10-CM | POA: Insufficient documentation

## 2013-03-03 DIAGNOSIS — K56609 Unspecified intestinal obstruction, unspecified as to partial versus complete obstruction: Secondary | ICD-10-CM | POA: Insufficient documentation

## 2013-03-03 DIAGNOSIS — Z923 Personal history of irradiation: Secondary | ICD-10-CM | POA: Insufficient documentation

## 2013-03-03 DIAGNOSIS — Z8559 Personal history of malignant neoplasm of other urinary tract organ: Secondary | ICD-10-CM | POA: Insufficient documentation

## 2013-03-03 DIAGNOSIS — Y849 Medical procedure, unspecified as the cause of abnormal reaction of the patient, or of later complication, without mention of misadventure at the time of the procedure: Secondary | ICD-10-CM | POA: Insufficient documentation

## 2013-03-03 DIAGNOSIS — G8929 Other chronic pain: Secondary | ICD-10-CM | POA: Insufficient documentation

## 2013-03-03 DIAGNOSIS — Z87440 Personal history of urinary (tract) infections: Secondary | ICD-10-CM | POA: Insufficient documentation

## 2013-03-03 DIAGNOSIS — T82598A Other mechanical complication of other cardiac and vascular devices and implants, initial encounter: Secondary | ICD-10-CM | POA: Insufficient documentation

## 2013-03-03 DIAGNOSIS — Z79899 Other long term (current) drug therapy: Secondary | ICD-10-CM | POA: Insufficient documentation

## 2013-03-03 DIAGNOSIS — E039 Hypothyroidism, unspecified: Secondary | ICD-10-CM | POA: Insufficient documentation

## 2013-03-03 DIAGNOSIS — K219 Gastro-esophageal reflux disease without esophagitis: Secondary | ICD-10-CM | POA: Insufficient documentation

## 2013-03-03 DIAGNOSIS — Z880 Allergy status to penicillin: Secondary | ICD-10-CM | POA: Insufficient documentation

## 2013-03-03 DIAGNOSIS — Z452 Encounter for adjustment and management of vascular access device: Secondary | ICD-10-CM

## 2013-03-03 NOTE — ED Provider Notes (Signed)
CSN: [id number]     Arrival date & time 03/03/13  1305 History  This chart was scribed for Gilda Crease, MD by Quintella Reichert, ED scribe.  This patient was seen in room APA19[facility] and the patient's care was started at 1:17 PM.    Chief Complaint  Patient presents with  . Vascular Access Problem    The history is provided by the patient. No language interpreter was used.    HPI Comments: [name] is a 62 y.o. female who presents to the Emergency Department complaining of a "knot" in a picc line in her right arm.  Pt has had a picc line to her right upper arm since 2010 for a small bowel obstruction and is receiving TPN 12 hours per day.  She states that she has recently noticed a gradually-worsening knot in the line near where it enters her skin.  She is concerned the knot may be a blood clot.  She has not had the line changed in 4 months and notes that she normally has it changed every 3 months.  She denies fever, chills, SOB, nausea, vomiting, or any other associated symptoms.   Past Medical History  Diagnosis Date  . Bowel obstruction several    Recurrent SBO secondary to adhesions  . Hypothyroidism   . Acid reflux   . Chronic back pain   . Hyperlipidemia   . Hypertension     pt says taken off medication since has lost weight.  . Cancer 2011 [facility])    diagnosed in 2009 per pt. Invasive High grade Urothelial carcinoma- s/p radiation and Chemo   . UTI (lower urinary tract infection)     Recurrent  . Malnutrition   . Leukopenia 12/06/2011    HIV serology negative  . Fatty liver    Past Surgical History  Procedure Laterality Date  . Back surgery      X2  . Hernia repair      mesh  . Abdominal surgery      with intestinal "puncture" x 2, exploratory surgery   . Cholecystectomy    . Ileo conduit      For bladder cancer  . Cystectomy      breast  . Portacath placement    . Bladder removal     Family History  Problem Relation Age of Onset  .  Heart disease Mother     enlarged  . Hypertension Mother   . Diabetes Father   . Diabetes Sister   . Diabetes Maternal Grandmother   . Colon cancer Neg Hx    History  Substance Use Topics  . Smoking status: Former Smoker    Quit date: 09/26/2010  . Smokeless tobacco: Not on file  . Alcohol Use: No   OB History   Grav Para Term Preterm Abortions TAB SAB Ect Mult Living                  Review of Systems  Constitutional: Negative for fever and chills.  Respiratory: Negative for shortness of breath.   Gastrointestinal: Negative for nausea and vomiting.  Musculoskeletal:       "knot" in picc line  All other systems reviewed and are negative.     Allergies  Bactrim; Codeine; Nitrofuran derivatives; Penicillins; and Sulfa antibiotics  Home Medications   Current Outpatient Rx  Name  Route  Sig  Dispense  Refill  . albuterol (PROVENTIL HFA;VENTOLIN HFA) 108 (90 BASE) MCG/ACT inhaler   Inhalation  Inhale 2 puffs into the lungs every 6 (six) hours as needed for wheezing.   1 Inhaler   0   . azithromycin (ZITHROMAX) 250 MG tablet      Take 2 tablet x1 and then 1 tablet daily x 4 days   6 tablet   0   . dexlansoprazole (DEXILANT) 60 MG capsule   Oral   Take 1 capsule (60 mg total) by mouth daily.   30 capsule   3   . docusate sodium (COLACE) 100 MG capsule   Oral   Take 100 mg by mouth 2 (two) times daily.          Marland Kitchen erythromycin ophthalmic ointment   Both Eyes   Place into both eyes at bedtime. Apply small ribbon to bilateral eyes three times a day for 5 days   3.5 g   0   . HYDROmorphone (DILAUDID) 2 MG tablet      1/2 to 1 tablet every 6 hours as needed   45 tablet   0   . levothyroxine (SYNTHROID, LEVOTHROID) 88 MCG tablet      TAKE 1 TABLET BY MOUTH EVERY DAY   30 tablet   3   . Multiple Vitamin (MULITIVITAMIN WITH MINERALS) TABS   Oral   Take 1 tablet by mouth every other day. On Tuesdays, Thursdays, Saturdays, and Sundays. **Administered  at bedtime with TPN administration         . polyethylene glycol powder (GLYCOLAX/MIRALAX) powder   Oral   Take 17 g by mouth daily.         . polyethylene glycol powder (GLYCOLAX/MIRALAX) powder      MIX 1 CAPFUL IN LIQUID AS DIRECTED AND TAKE DAILY   527 g   5   . promethazine (PHENERGAN) 12.5 MG tablet   Oral   Take 1 tablet (12.5 mg total) by mouth every 8 (eight) hours as needed for nausea.   10 tablet   0   . TPN NICU   Intravenous   Inject into the vein continuous. Formula copy place in paper chart.   Lipids daily due to use of 3 in 1 bag.          BP 130/79  Pulse 71  Temp(Src) 97.7 F (36.5 C) (Oral)  Resp 18  Ht 5' 5.5" (1.664 m)  Wt 153 lb (69.4 kg)  BMI 25.06 kg/m2  SpO2 100%  Physical Exam  Constitutional: She is oriented to person, place, and time. She appears well-developed and well-nourished. No distress.  HENT:  Head: Normocephalic and atraumatic.  Right Ear: Hearing normal.  Left Ear: Hearing normal.  Nose: Nose normal.  Mouth/Throat: Oropharynx is clear and moist and mucous membranes are normal.  Eyes: Conjunctivae and EOM are normal. Pupils are equal, round, and reactive to light.  Neck: Normal range of motion. Neck supple.  Cardiovascular: Regular rhythm, S1 normal and S2 normal.  Exam reveals no gallop and no friction rub.   No murmur heard. Pulmonary/Chest: Effort normal and breath sounds normal. No respiratory distress. She exhibits no tenderness.  Abdominal: Soft. Normal appearance and bowel sounds are normal. There is no hepatosplenomegaly. There is no tenderness. There is no rebound, no guarding, no tenderness at McBurney's point and negative Murphy's sign. No hernia.  Musculoskeletal: Normal range of motion.  Area where picc line enters skin is hyperpigmented and hardened  Neurological: She is alert and oriented to person, place, and time. She has normal strength. No cranial nerve deficit  or sensory deficit. Coordination normal. GCS  eye subscore is 4. GCS verbal subscore is 5. GCS motor subscore is 6.  Skin: Skin is warm, dry and intact. No rash noted. No cyanosis.  Psychiatric: She has a normal mood and affect. Her speech is normal and behavior is normal. Thought content normal.    ED Course  Procedures (including critical care time)  DIAGNOSTIC STUDIES: Oxygen Saturation is 100% on room air, normal by my interpretation.    COORDINATION OF CARE: 1:19 PM: Discussed treatment plan which includes [place].  Pt expressed understanding and agreed to plan.   Labs Review Labs Reviewed - No data to display  Imaging Review No results found.  MDM  Diagnosis: PIC Catheter problem  Patient presents with concerns over a knot in the area where her PICC enters her arm. There is some hyperpigmentation and nodularity palpable but no tenderness, no purulence, no erythema. There is no concern for infection at this time. Ultrasound was performed to rule out DVT and none is seen. Examination is consistent with changes of the skin and subcutaneous tissue secondary to the chronicity of the catheter. I recommended that she followup with her prescribing doctor to have the PICC replaced to the other arm, but for the short term can  continue to use it.    I personally performed the services described in this documentation, which was scribed in my presence. The recorded information has been reviewed and is accurate.     Gilda Crease, MD 03/03/13 1535

## 2013-03-03 NOTE — ED Notes (Signed)
Pt has picc line to right upper arm, has felt a knot where picc is located and was told to come to ed for eval of picc line, "may be a blood clot"

## 2013-03-04 NOTE — Telephone Encounter (Signed)
LMTRC both days

## 2013-03-05 NOTE — Telephone Encounter (Signed)
LMTRC

## 2013-03-08 NOTE — Telephone Encounter (Signed)
LMTRC

## 2013-03-09 NOTE — Telephone Encounter (Signed)
I have called this pt numerous times with no success, i will try one more time and then send letter to contact office

## 2013-03-12 ENCOUNTER — Ambulatory Visit (INDEPENDENT_AMBULATORY_CARE_PROVIDER_SITE_OTHER): Payer: Medicare Other | Admitting: Family Medicine

## 2013-03-12 VITALS — BP 110/80 | HR 68 | Temp 97.7°F | Resp 18 | Wt 154.0 lb

## 2013-03-12 DIAGNOSIS — N390 Urinary tract infection, site not specified: Secondary | ICD-10-CM

## 2013-03-12 DIAGNOSIS — K56609 Unspecified intestinal obstruction, unspecified as to partial versus complete obstruction: Secondary | ICD-10-CM

## 2013-03-12 LAB — URINALYSIS, ROUTINE W REFLEX MICROSCOPIC
Bilirubin Urine: NEGATIVE
Glucose, UA: NEGATIVE mg/dL
Protein, ur: 100 mg/dL — AB
Specific Gravity, Urine: 1.015 (ref 1.005–1.030)
pH: >9 — ABNORMAL HIGH (ref 5.0–8.0)

## 2013-03-12 LAB — URINALYSIS, MICROSCOPIC ONLY

## 2013-03-12 MED ORDER — CEPHALEXIN 500 MG PO CAPS: 500.0000 mg | ORAL_CAPSULE | Freq: Four times a day (QID) | ORAL | Status: DC

## 2013-03-12 MED ORDER — ONDANSETRON 8 MG PO TBDP: 8.0000 mg | ORAL_TABLET | Freq: Three times a day (TID) | ORAL | Status: DC | PRN

## 2013-03-12 NOTE — Progress Notes (Signed)
  Subjective:    Patient ID: [name], female    DOB: 1951-03-23, 62 y.o.   MRN: [id number]  HPI  Patient here with fatigue abdominal cramping nausea vomiting for the past 5 days. She does admit to eating a small pizza pizza earlier this week but no other solid foods. She began having nausea and vomiting yesterday. She has been having bowel movements but has not been passing a lot of gas. Her urine has been very foul and very dark and odor. She thinks she has another urinary tract infection.  Had PICC line recently evaluated, was not changed out  Review of Systems   GEN- + fatigue, denies fever, weight loss,weakness, recent illness, +chills HEENT- denies eye drainage, change in vision, nasal discharge, CVS- denies chest pain, palpitations RESP- denies SOB, cough, wheeze ABD- + N/V, change in stools,+ abd pain GU- denies dysuria, hematuria, dribbling, incontinence       Objective:   Physical Exam  GEN- NAD, alert and oriented x3, non toxic appearing HEENT- PERRL, EOMI, non injected sclera,  MMM, oropharynx clear CVS- RRR, no murmur RESP-CTAB ABD-hypoactive bowel sounds,soft,mild TTP lower quadrants,ND,urostomy bag in tact EXT- No edema Pulses- Radial 2+       Assessment & Plan:

## 2013-03-12 NOTE — Patient Instructions (Signed)
Liquids this  Weekend Take antibiotics  If you get worse then go to ER F/U as needed

## 2013-03-14 ENCOUNTER — Encounter: Payer: Self-pay | Admitting: Family Medicine

## 2013-03-14 LAB — URINE CULTURE: Colony Count: >=100000

## 2013-03-14 NOTE — Assessment & Plan Note (Signed)
Culture urine, with her systemic symptoms of chills and fatigue will start keflex

## 2013-03-14 NOTE — Assessment & Plan Note (Signed)
Based on hypoactivity of bowels she likley is at beginning of another SBO. Small sips of liquids only, no food, pain control She knows when to go to ER

## 2013-03-30 ENCOUNTER — Ambulatory Visit: Payer: Medicare Other | Admitting: Family Medicine

## 2013-04-05 ENCOUNTER — Telehealth: Payer: Self-pay | Admitting: Family Medicine

## 2013-04-05 ENCOUNTER — Telehealth (HOSPITAL_COMMUNITY): Payer: Self-pay

## 2013-04-05 NOTE — Telephone Encounter (Signed)
callled [facility] to schedule this and the office was clossed will call first thing in the morning.

## 2013-04-05 NOTE — Telephone Encounter (Signed)
Order written for alteplase Protocol, to be done at [facility] as previous for her PICC line occlusion

## 2013-04-05 NOTE — Telephone Encounter (Signed)
Her pick line is clogged. She needs an order sent to [facility] for med to unclog line so home health can draw blood

## 2013-04-05 NOTE — Telephone Encounter (Signed)
Needs order to unclog pic line

## 2013-04-05 NOTE — Telephone Encounter (Signed)
error 

## 2013-04-06 ENCOUNTER — Encounter (HOSPITAL_COMMUNITY)
Admission: RE | Admit: 2013-04-06 | Discharge: 2013-04-06 | Disposition: A | Discharge status: Home / Self Care | Payer: Medicare Other | Source: Ambulatory Visit | Attending: Family Medicine | Admitting: Family Medicine

## 2013-04-06 DIAGNOSIS — Z452 Encounter for adjustment and management of vascular access device: Secondary | ICD-10-CM | POA: Insufficient documentation

## 2013-04-06 MED ORDER — SODIUM CHLORIDE 0.9 % IJ SOLN
10.0000 mL | INTRAMUSCULAR | Status: DC | PRN
  Administered 2013-04-06: 10 mL
  Administered 2013-04-06: 20 mL
  Filled 2013-04-06: qty 20

## 2013-04-06 MED ORDER — ALTEPLASE 2 MG IJ SOLR
INTRAMUSCULAR | Status: AC
  Filled 2013-04-06: qty 2

## 2013-04-06 MED ORDER — STERILE WATER FOR INJECTION IJ SOLN
INTRAMUSCULAR | Status: AC
  Filled 2013-04-06: qty 10

## 2013-04-06 MED ORDER — ALTEPLASE 2 MG IJ SOLR
2.0000 mg | Freq: Once | INTRAMUSCULAR | Status: AC
  Administered 2013-04-06: 2 mg

## 2013-04-06 NOTE — Telephone Encounter (Signed)
[facility] called this morning and script was faxed to [facility]

## 2013-04-30 ENCOUNTER — Other Ambulatory Visit: Payer: Self-pay | Admitting: Family Medicine

## 2013-04-30 MED ORDER — ONDANSETRON 8 MG PO TBDP: 8.0000 mg | ORAL_TABLET | Freq: Three times a day (TID) | ORAL | Status: DC | PRN

## 2013-04-30 MED ORDER — HYDROMORPHONE HCL 2 MG PO TABS: 1.0000 mg | ORAL_TABLET | Freq: Four times a day (QID) | ORAL | Status: DC | PRN

## 2013-04-30 NOTE — Telephone Encounter (Signed)
Pt here with mother requested medication refill

## 2013-05-31 ENCOUNTER — Other Ambulatory Visit: Payer: Self-pay | Admitting: *Deleted

## 2013-05-31 MED ORDER — DEXLANSOPRAZOLE 60 MG PO CPDR: 60.0000 mg | DELAYED_RELEASE_CAPSULE | Freq: Every day | ORAL | Status: DC

## 2013-05-31 NOTE — Telephone Encounter (Signed)
Dexilant DR 60 mg take one capsule by mouth every day #30   Med refilled

## 2013-06-25 ENCOUNTER — Telehealth: Payer: Self-pay | Admitting: Family Medicine

## 2013-06-25 MED ORDER — LEVOTHYROXINE SODIUM 88 MCG PO TABS: 88.0000 ug | ORAL_TABLET | Freq: Every day | ORAL | Status: DC

## 2013-06-25 NOTE — Telephone Encounter (Signed)
Follow up appt pending one month refill given

## 2013-06-28 ENCOUNTER — Ambulatory Visit (INDEPENDENT_AMBULATORY_CARE_PROVIDER_SITE_OTHER): Payer: Medicare Other | Admitting: Family Medicine

## 2013-06-28 ENCOUNTER — Encounter: Payer: Self-pay | Admitting: Family Medicine

## 2013-06-28 VITALS — BP 118/84 | HR 84 | Temp 98.0°F | Resp 18 | Wt 157.0 lb

## 2013-06-28 DIAGNOSIS — E039 Hypothyroidism, unspecified: Secondary | ICD-10-CM

## 2013-06-28 DIAGNOSIS — Z789 Other specified health status: Secondary | ICD-10-CM

## 2013-06-28 DIAGNOSIS — R319 Hematuria, unspecified: Secondary | ICD-10-CM

## 2013-06-28 DIAGNOSIS — G8929 Other chronic pain: Secondary | ICD-10-CM

## 2013-06-28 DIAGNOSIS — R109 Unspecified abdominal pain: Secondary | ICD-10-CM

## 2013-06-28 DIAGNOSIS — I10 Essential (primary) hypertension: Secondary | ICD-10-CM

## 2013-06-28 DIAGNOSIS — R7989 Other specified abnormal findings of blood chemistry: Secondary | ICD-10-CM

## 2013-06-28 LAB — URINALYSIS, MICROSCOPIC ONLY: Casts: NONE SEEN

## 2013-06-28 LAB — URINALYSIS, ROUTINE W REFLEX MICROSCOPIC
Glucose, UA: NEGATIVE mg/dL
Nitrite: NEGATIVE
Protein, ur: 100 mg/dL — AB
pH: 8.5 — ABNORMAL HIGH (ref 5.0–8.0)

## 2013-06-28 MED ORDER — LEVOTHYROXINE SODIUM 88 MCG PO TABS: 88.0000 ug | ORAL_TABLET | Freq: Every day | ORAL | Status: DC

## 2013-06-28 MED ORDER — ONDANSETRON 8 MG PO TBDP: 8.0000 mg | ORAL_TABLET | Freq: Three times a day (TID) | ORAL | Status: DC | PRN

## 2013-06-28 MED ORDER — OXYCODONE HCL 10 MG PO TABS: ORAL_TABLET | ORAL | Status: DC

## 2013-06-28 NOTE — Patient Instructions (Signed)
Pulmonary Function Test to be done Try the oxycodone for pain Thyroid test to be done F/U 3 months

## 2013-06-29 ENCOUNTER — Encounter: Payer: Self-pay | Admitting: *Deleted

## 2013-06-29 LAB — TSH: TSH: 0.509 u[IU]/mL (ref 0.350–4.500)

## 2013-06-29 LAB — URINE CULTURE: Colony Count: 80000

## 2013-06-29 NOTE — Assessment & Plan Note (Signed)
She's had this evaluated in the past. She does have hematuria which is always noted in her urine. She's not shown any signs of infection at this time. I do not see any active bleeding from the stoma but it was likely some irritation to

## 2013-06-29 NOTE — Progress Notes (Signed)
   Subjective:    Patient ID: [name], female    DOB: 1950-08-03, 62 y.o.   MRN: [id number]  HPI History here to followup chronic medical problems. She did notice some blood in her urostomy bag over the weekend. She also noticed a small amount of oozing from her stoma. She denies any pain she's had a little bit of debris in the urostomy bag but otherwise the urine has been clear. Her bowels are moving fairly well. Of note she was recently evaluated by her surgeon at [facility] who provides her TPN therapy and she was advised that she can start to eat solid foods again. She did have some bloating with some foods but overall has done well. She does state that she would like to switch back to oxycodone because of dilaudid does not seem to work as well which she does have abdominal pain. Her recent labs were reviewed but she is due for a thyroid function   Review of Systems  GEN- denies fatigue, fever, weight loss,weakness, recent illness HEENT- denies eye drainage, change in vision, nasal discharge, CVS- denies chest pain, palpitations RESP- denies SOB, cough, wheeze ABD- denies N/V, change in stools, abd pain GU- denies dysuria, hematuria, dribbling, incontinence MSK- denies joint pain, muscle aches, injury Neuro- denies headache, dizziness, syncope, seizure activity      Objective:   Physical Exam  GEN- NAD, alert and oriented x3 HEENT-PERRL, EOMI, non icteric, MMM, oropharynx clear NECK-no thyromegaly, supple CVS- RRR, no murmur RESP-CTAB ABD- NABS, NT,ND ostomy in RLQ, no gaurding, stoma no apparent bleeding, no abnormal drainage Skin- erythema beneath adhesive, no blisters, no papules EXT- No edema Pulses- Radial 2+       Assessment & Plan:

## 2013-06-29 NOTE — Assessment & Plan Note (Signed)
She will continue with her TPN therapy even though she is introducing more solid foods. I've advised her to take this very careful as she's had multiple small bowel obstructions from eating various foods

## 2013-06-29 NOTE — Assessment & Plan Note (Signed)
Chronic abdominal pain due to her multiple surgeries and bowel obstructions. I've given her oxycodone 10 mg

## 2013-06-29 NOTE — Assessment & Plan Note (Signed)
Blood pressure is still well controlled without any medication

## 2013-06-29 NOTE — Assessment & Plan Note (Signed)
Thyroid function test was repeated today continue Synthroid

## 2013-06-29 NOTE — Assessment & Plan Note (Signed)
Her recent LFTs look very good.

## 2013-07-01 DIAGNOSIS — I82409 Acute embolism and thrombosis of unspecified deep veins of unspecified lower extremity: Secondary | ICD-10-CM

## 2013-07-01 HISTORY — DX: Acute embolism and thrombosis of unspecified deep veins of unspecified lower extremity: I82.409

## 2013-07-21 ENCOUNTER — Ambulatory Visit (INDEPENDENT_AMBULATORY_CARE_PROVIDER_SITE_OTHER): Payer: Medicare Other | Admitting: Family Medicine

## 2013-07-21 ENCOUNTER — Encounter: Payer: Self-pay | Admitting: Family Medicine

## 2013-07-21 VITALS — BP 100/70 | HR 82 | Temp 97.7°F | Resp 18 | Ht 69.0 in | Wt 160.0 lb

## 2013-07-21 DIAGNOSIS — K56609 Unspecified intestinal obstruction, unspecified as to partial versus complete obstruction: Secondary | ICD-10-CM

## 2013-07-21 DIAGNOSIS — K56600 Partial intestinal obstruction, unspecified as to cause: Secondary | ICD-10-CM

## 2013-07-21 DIAGNOSIS — N390 Urinary tract infection, site not specified: Secondary | ICD-10-CM

## 2013-07-21 LAB — URINALYSIS, ROUTINE W REFLEX MICROSCOPIC
BILIRUBIN URINE: NEGATIVE
GLUCOSE, UA: NEGATIVE mg/dL
Ketones, ur: NEGATIVE mg/dL
Nitrite: NEGATIVE
PH: 8.5 — AB (ref 5.0–8.0)
Protein, ur: 100 mg/dL — AB
SPECIFIC GRAVITY, URINE: 1.02 (ref 1.005–1.030)
Urobilinogen, UA: 0.2 mg/dL (ref 0.0–1.0)

## 2013-07-21 LAB — URINALYSIS, MICROSCOPIC ONLY
CASTS: NONE SEEN
CRYSTALS: NONE SEEN
SQUAMOUS EPITHELIAL / LPF: NONE SEEN

## 2013-07-21 MED ORDER — CIPROFLOXACIN HCL 500 MG PO TABS: 500.0000 mg | ORAL_TABLET | Freq: Two times a day (BID) | ORAL | Status: DC

## 2013-07-21 NOTE — Assessment & Plan Note (Signed)
Start Cipro, culture sent

## 2013-07-21 NOTE — Patient Instructions (Signed)
Continue current meds Start cipro F/U as previous

## 2013-07-21 NOTE — Assessment & Plan Note (Addendum)
Based on exam i am concerned for PSBO as well, she has increased solids as she was told this was possible by her physicians at [facility], though everytime she does this she has abd pain and bowel obstruction Difficult to tease out her PSBO from her UTI's as both cause abd pain and she does not have true UTI symptoms of dysuria Advised to decrease PO, small amounts of liquids, applesauce only for next few days

## 2013-07-21 NOTE — Progress Notes (Signed)
   Subjective:    Patient ID: [name], female    DOB: February 01, 1951, 63 y.o.   MRN: [id number]  HPI  Patient here with abdominal pain for the past 2 weeks. She's noted some darkening of her urine as well as a lot of sediment in the urine. She denies any fever but has had a few episodes of vomiting the last 3 days ago. Admit she has been eating a lot but more solids and has some difficulties with a chicken salad about 3 weeks ago which resulted in some vomiting on Sunday she also drank some soda which she thinks triggered a vomiting episode that she last had.  Review of Systems   GEN- denies fatigue, fever, weight loss,weakness, recent illness HEENT- denies eye drainage, change in vision, nasal discharge, CVS- denies chest pain, palpitations RESP- denies SOB, cough, wheeze ABD- denies N/V, change in stools, +abd pain Neuro- denies headache, dizziness, syncope, seizure activity      Objective:   Physical Exam GEN- NAD, alert and oriented x3 HEENT-PERRL, EOMI, non icteric, MMM, oropharynx clear CVS- RRR, no murmur RESP-CTAB ABD- High pitched bowel sounds diffusely,TTP epigastric region and around ostomy,ND ostomy in RLQ, no gaurding, stoma no apparent bleeding,+sediment in urine, dark color EXT- No edema Pulses- Radial 2+       Assessment & Plan:

## 2013-07-23 LAB — URINE CULTURE: Colony Count: >=100000

## 2013-08-25 ENCOUNTER — Ambulatory Visit (INDEPENDENT_AMBULATORY_CARE_PROVIDER_SITE_OTHER): Payer: Medicare Other | Admitting: Family Medicine

## 2013-08-25 ENCOUNTER — Encounter: Payer: Self-pay | Admitting: Family Medicine

## 2013-08-25 VITALS — BP 128/72 | HR 64 | Temp 97.7°F | Resp 20 | Ht 64.5 in | Wt 158.0 lb

## 2013-08-25 DIAGNOSIS — J069 Acute upper respiratory infection, unspecified: Secondary | ICD-10-CM

## 2013-08-25 MED ORDER — AZITHROMYCIN 250 MG PO TABS: ORAL_TABLET | ORAL | Status: DC

## 2013-08-25 MED ORDER — DEXLANSOPRAZOLE 60 MG PO CPDR: 60.0000 mg | DELAYED_RELEASE_CAPSULE | Freq: Every day | ORAL | Status: DC

## 2013-08-25 MED ORDER — POLYETHYLENE GLYCOL 3350 17 GM/SCOOP PO POWD: 17.0000 g | Freq: Every day | ORAL | Status: DC

## 2013-08-25 MED ORDER — ALBUTEROL SULFATE HFA 108 (90 BASE) MCG/ACT IN AERS: 2.0000 | INHALATION_SPRAY | Freq: Four times a day (QID) | RESPIRATORY_TRACT | Status: DC | PRN

## 2013-08-25 MED ORDER — LEVOTHYROXINE SODIUM 88 MCG PO TABS: 88.0000 ug | ORAL_TABLET | Freq: Every day | ORAL | Status: DC

## 2013-08-25 MED ORDER — BENZONATATE 100 MG PO CAPS: 100.0000 mg | ORAL_CAPSULE | Freq: Three times a day (TID) | ORAL | Status: DC | PRN

## 2013-08-25 NOTE — Patient Instructions (Signed)
Upper Respiratory Infection Take antibiotics as prescribed Use cough medicine F/U 3 months

## 2013-08-25 NOTE — Progress Notes (Signed)
Patient ID: [name], female   DOB: 1951-05-13, 63 y.o.   MRN: [id number]     Subjective:    Patient ID: [name], female    DOB: 02-Jul-1950, 63 y.o.   MRN: [id number]  Patient presents for cold  patient here with cough with production for the past 4 days. She is a known smoker. She's been and out of the hospital as well as the nursing home with her mother who also has a respiratory illness. She's not had any fever but has had some chills. She denies any nausea vomiting but was recently hospitalized about 3 weeks ago for bowel obstruction at [facility]. He just has a greenish yellow tinge.    Review Of Systems:  GEN- +fatigue, fever, weight loss,weakness, recent illness HEENT- denies eye drainage, change in vision, nasal discharge, CVS- denies chest pain, palpitations RESP- denies SOB, +cough,+ wheeze ABD- denies N/V, change in stools, abd pain Neuro- denies headache, dizziness, syncope, seizure activity       Objective:    BP 128/72  Pulse 64  Temp(Src) 97.7 F (36.5 C)  Resp 20  Ht 5' 4.5" (1.638 m)  Wt 158 lb (71.668 kg)  BMI 26.71 kg/m2 GEN- NAD, alert and oriented x3 HEENT- PERRL, EOMI, non injected sclera, pink, TM obscurred by wax otherwise clear, nares clear, mild maxillary sinus tenderness left side, conjunctiva, MMM, oropharynx injected Neck- Supple, shotty LAD CVS- RRR, no murmur RESP-Course BS, no wheeze, no rales ABD-NABS,soft,NT,ND EXT- No edema Pulses- Radial 2+        Assessment & Plan:      Problem List Items Addressed This Visit   URI (upper respiratory infection) - Primary     Treat with antibiotics, albuterol, cough medicine High risk with recent hospilizations , smoker    Relevant Medications      azithromycin (ZITHROMAX) tablet      Note: This dictation was prepared with Dragon dictation along with smaller phrase technology. Any transcriptional errors that result from this process are unintentional.

## 2013-08-25 NOTE — Assessment & Plan Note (Signed)
Treat with antibiotics, albuterol, cough medicine High risk with recent hospilizations , smoker

## 2013-09-29 ENCOUNTER — Encounter: Payer: Self-pay | Admitting: Family Medicine

## 2013-09-29 ENCOUNTER — Ambulatory Visit (INDEPENDENT_AMBULATORY_CARE_PROVIDER_SITE_OTHER): Payer: Medicare Other | Admitting: Family Medicine

## 2013-09-29 VITALS — BP 128/72 | HR 78 | Temp 97.8°F | Resp 16 | Ht 65.5 in | Wt 166.0 lb

## 2013-09-29 DIAGNOSIS — G8929 Other chronic pain: Secondary | ICD-10-CM

## 2013-09-29 DIAGNOSIS — R109 Unspecified abdominal pain: Secondary | ICD-10-CM

## 2013-09-29 DIAGNOSIS — K56609 Unspecified intestinal obstruction, unspecified as to partial versus complete obstruction: Secondary | ICD-10-CM

## 2013-09-29 MED ORDER — HYDROMORPHONE HCL 2 MG PO TABS: 2.0000 mg | ORAL_TABLET | Freq: Four times a day (QID) | ORAL | Status: DC | PRN

## 2013-09-29 NOTE — Progress Notes (Signed)
Patient ID: [name], female   DOB: 1950/09/02, 63 y.o.   MRN: [id number]   Subjective:    Patient ID: [name], female    DOB: 07-06-1950, 63 y.o.   MRN: [id number]  Patient presents for Follow-up  patient here to follow possible [place] for her recurrent bowel structures she was admitted to [facility] secondary to bowel obstruction in the first week of March she started eating chicken nuggets and had an obstruction. She is now n.p.o. and is asking to try food again. We discussed her multiple admissions and always occurs when she tries to eat above baby food consistency. She is due to go out of town next week for about 2 weeks trip to see her son and grandchildren in [place]. She otherwise has no concerns today she will like a refill on her pain medication before she travels. They did check her for urinary infection before she left and that was normal also her labs were normal. Of note she did try to have NG tube placed however she had bleeding and more pain therefore they were unable to pass this.    Review Of Systems:  GEN- denies fatigue, fever, weight loss,weakness, recent illness HEENT- denies eye drainage, change in vision, nasal discharge, CVS- denies chest pain, palpitations RESP- denies SOB, cough, wheeze ABD- denies N/V, change in stools, abd pain MSK- denies joint pain, muscle aches, injury Neuro- denies headache, dizziness, syncope, seizure activity       Objective:    BP 128/72  Pulse 78  Temp(Src) 97.8 F (36.6 C)  Resp 16  Ht 5' 5.5" (1.664 m)  Wt 166 lb (75.297 kg)  BMI 27.19 kg/m2 GEN- NAD, alert and oriented x3 HEENT- PERRL, EOMI, non injected sclera, pink,MMM , oropharynx clear CVS- RRR, no murmur RESP-CTAB ABD-NABS,soft,NT,ND, bag in place EXT- No edema Pulses- Radial 2+       Assessment & Plan:      Problem List Items Addressed This Visit   SBO (small bowel obstruction) - Primary     She continues to have recurrent bowel  obstructions which is not new based on her eating and had this conversation with her multiple time she cannot go her baby food consistency her pain medications for refilled    Chronic abdominal pain     Per above she has too much scar tissue but had too many recurrent abdominal obstruction for any surgical intervention    Relevant Medications      HYDROmorphone (DILAUDID) tablet      Note: This dictation was prepared with Dragon dictation along with smaller phrase technology. Any transcriptional errors that result from this process are unintentional.

## 2013-09-29 NOTE — Assessment & Plan Note (Signed)
Per above she has too much scar tissue but had too many recurrent abdominal obstruction for any surgical intervention

## 2013-09-29 NOTE — Patient Instructions (Signed)
Continue current medications RElease of RECORDS from [facility] discharge summary Baby food consistency Prescription for the sleeve F/U as previous

## 2013-09-29 NOTE — Assessment & Plan Note (Signed)
She continues to have recurrent bowel obstructions which is not new based on her eating and had this conversation with her multiple time she cannot go her baby food consistency her pain medications for refilled

## 2013-10-25 ENCOUNTER — Other Ambulatory Visit: Payer: Self-pay | Admitting: Family Medicine

## 2013-10-25 NOTE — Telephone Encounter (Signed)
Refill appropriate and filled per protocol. 

## 2013-11-23 ENCOUNTER — Encounter: Payer: Self-pay | Admitting: Family Medicine

## 2013-11-23 ENCOUNTER — Other Ambulatory Visit: Payer: Self-pay | Admitting: Family Medicine

## 2013-11-23 ENCOUNTER — Ambulatory Visit (INDEPENDENT_AMBULATORY_CARE_PROVIDER_SITE_OTHER): Payer: Medicare Other | Admitting: Family Medicine

## 2013-11-23 VITALS — BP 122/70 | HR 72 | Temp 97.8°F | Resp 16 | Ht 63.5 in | Wt 153.0 lb

## 2013-11-23 DIAGNOSIS — E039 Hypothyroidism, unspecified: Secondary | ICD-10-CM

## 2013-11-23 DIAGNOSIS — Z8551 Personal history of malignant neoplasm of bladder: Secondary | ICD-10-CM

## 2013-11-23 DIAGNOSIS — E785 Hyperlipidemia, unspecified: Secondary | ICD-10-CM

## 2013-11-23 DIAGNOSIS — G8929 Other chronic pain: Secondary | ICD-10-CM

## 2013-11-23 DIAGNOSIS — K7689 Other specified diseases of liver: Secondary | ICD-10-CM

## 2013-11-23 DIAGNOSIS — K760 Fatty (change of) liver, not elsewhere classified: Secondary | ICD-10-CM

## 2013-11-23 DIAGNOSIS — I10 Essential (primary) hypertension: Secondary | ICD-10-CM

## 2013-11-23 DIAGNOSIS — Z1231 Encounter for screening mammogram for malignant neoplasm of breast: Secondary | ICD-10-CM

## 2013-11-23 DIAGNOSIS — Z780 Asymptomatic menopausal state: Secondary | ICD-10-CM

## 2013-11-23 DIAGNOSIS — R109 Unspecified abdominal pain: Secondary | ICD-10-CM

## 2013-11-23 LAB — CBC WITH DIFFERENTIAL/PLATELET
BASOS ABS: 0 10*3/uL (ref 0.0–0.1)
Basophils Relative: 0 % (ref 0–1)
EOS ABS: 0.1 10*3/uL (ref 0.0–0.7)
EOS PCT: 2 % (ref 0–5)
HCT: 32.6 % — ABNORMAL LOW (ref 36.0–46.0)
Hemoglobin: 11 g/dL — ABNORMAL LOW (ref 12.0–15.0)
LYMPHS PCT: 22 % (ref 12–46)
Lymphs Abs: 1.5 10*3/uL (ref 0.7–4.0)
MCH: 26 pg (ref 26.0–34.0)
MCHC: 33.7 g/dL (ref 30.0–36.0)
MCV: 77.1 fL — AB (ref 78.0–100.0)
Monocytes Absolute: 0.6 10*3/uL (ref 0.1–1.0)
Monocytes Relative: 9 % (ref 3–12)
NEUTROS PCT: 67 % (ref 43–77)
Neutro Abs: 4.5 10*3/uL (ref 1.7–7.7)
PLATELETS: 390 10*3/uL (ref 150–400)
RBC: 4.23 MIL/uL (ref 3.87–5.11)
RDW: 16.9 % — AB (ref 11.5–15.5)
WBC: 6.7 10*3/uL (ref 4.0–10.5)

## 2013-11-23 MED ORDER — HYDROMORPHONE HCL 2 MG PO TABS: 2.0000 mg | ORAL_TABLET | Freq: Four times a day (QID) | ORAL | Status: DC | PRN

## 2013-11-23 NOTE — Patient Instructions (Addendum)
Mammogram and Bone Density  We will call with lab results Continue current medications F/U 4 months

## 2013-11-23 NOTE — Assessment & Plan Note (Signed)
Recheck lipid panel and liver function

## 2013-11-23 NOTE — Assessment & Plan Note (Signed)
Blood pressure well controlled off of medications. We'll check her renal function

## 2013-11-23 NOTE — Assessment & Plan Note (Signed)
Check thyroid function test

## 2013-11-23 NOTE — Assessment & Plan Note (Signed)
Chronic urostomy secondary to the bladder cancer and complications. She's also on TPN secondary to recurrent bowel obstruction

## 2013-11-23 NOTE — Assessment & Plan Note (Signed)
Chronic abdominal pain secondary to above. She continues to take the lauded typically takes about one tablet a day

## 2013-11-23 NOTE — Progress Notes (Signed)
Patient ID: [name], female   DOB: Mar 09, 1951, 63 y.o.   MRN: [id number]   Subjective:    Patient ID: [name], female    DOB: Jan 18, 1951, 63 y.o.   MRN: [id number]  Patient presents for 3 month F/U  patient here follow chronic medical problems. She has no specific concerns today. She states that she has been eating meat the past couple days but not had any abdominal pain. Her bowel movements have been consistently on a softer in. She does request a refill on her pain medication. She is trying to walk a mile each day and has lost some weight and feels good. She is due for mammogram and bone density testing. She's also due for fasting labs.    Review Of Systems:  GEN- denies fatigue, fever, weight loss,weakness, recent illness HEENT- denies eye drainage, change in vision, nasal discharge, CVS- denies chest pain, palpitations RESP- denies SOB, cough, wheeze ABD- denies N/V, change in stools,+ chronic abd pain GU- denies dysuria, hematuria, dribbling, incontinence MSK- denies joint pain, muscle aches, injury Neuro- denies headache, dizziness, syncope, seizure activity       Objective:    BP 122/70  Pulse 72  Temp(Src) 97.8 F (36.6 C) (Oral)  Resp 16  Ht 5' 3.5" (1.613 m)  Wt 153 lb (69.4 kg)  BMI 26.67 kg/m2 GEN- NAD, alert and oriented x3 HEENT- PERRL, EOMI, non injected sclera, pink,MMM , oropharynx clear Neck- Supple , no thyroidmegaly CVS- RRR, no murmur RESP-CTAB ABD-NABS,soft,NT,ND, bag in place EXT- No edema Pulses- Radial 2+         Assessment & Plan:      Problem List Items Addressed This Visit   HYPOTHYROIDISM - Primary   Relevant Orders      TSH   HYPERTENSION   Relevant Orders      CBC with Differential      Comprehensive metabolic panel   HYPERLIPIDEMIA   Relevant Orders      Lipid panel   Hx of bladder cancer   Relevant Medications      HYDROmorphone (DILAUDID) tablet    Other Visit Diagnoses   Other screening mammogram         Relevant Orders       MM DIGITAL SCREENING BILATERAL    Asymptomatic postmenopausal status (age-related) (natural)        Relevant Orders       DG Bone Density       Note: This dictation was prepared with Dragon dictation along with smaller phrase technology. Any transcriptional errors that result from this process are unintentional.

## 2013-11-24 ENCOUNTER — Encounter: Payer: Self-pay | Admitting: *Deleted

## 2013-11-24 LAB — COMPREHENSIVE METABOLIC PANEL
ALT: 12 U/L (ref 0–35)
AST: 16 U/L (ref 0–37)
Albumin: 3.3 g/dL — ABNORMAL LOW (ref 3.5–5.2)
Alkaline Phosphatase: 179 U/L — ABNORMAL HIGH (ref 39–117)
BUN: 25 mg/dL — ABNORMAL HIGH (ref 6–23)
CHLORIDE: 105 meq/L (ref 96–112)
CO2: 26 meq/L (ref 19–32)
CREATININE: 0.8 mg/dL (ref 0.50–1.10)
Calcium: 9 mg/dL (ref 8.4–10.5)
Glucose, Bld: 87 mg/dL (ref 70–99)
Potassium: 4.7 mEq/L (ref 3.5–5.3)
SODIUM: 139 meq/L (ref 135–145)
TOTAL PROTEIN: 6.2 g/dL (ref 6.0–8.3)
Total Bilirubin: 0.5 mg/dL (ref 0.2–1.2)

## 2013-11-24 LAB — TSH: TSH: 0.093 u[IU]/mL — ABNORMAL LOW (ref 0.350–4.500)

## 2013-11-24 LAB — LIPID PANEL
CHOL/HDL RATIO: 3.3 ratio
Cholesterol: 105 mg/dL (ref 0–200)
HDL: 32 mg/dL — ABNORMAL LOW (ref >39)
LDL Cholesterol: 63 mg/dL (ref 0–99)
Triglycerides: 50 mg/dL (ref <150)
VLDL: 10 mg/dL (ref 0–40)

## 2013-11-24 LAB — T4: T4, Total: 9.3 ug/dL (ref 5.0–12.5)

## 2013-11-24 LAB — T3, FREE: T3, Free: 3.6 pg/mL (ref 2.3–4.2)

## 2013-11-25 ENCOUNTER — Telehealth: Payer: Self-pay | Admitting: *Deleted

## 2013-11-25 MED ORDER — ONDANSETRON 8 MG PO TBDP: 8.0000 mg | ORAL_TABLET | Freq: Three times a day (TID) | ORAL | Status: DC | PRN

## 2013-11-25 NOTE — Telephone Encounter (Signed)
[place].  Prescription sent to pharmacy.

## 2013-11-25 NOTE — Telephone Encounter (Signed)
Message copied by Sheral Flow on Thu Nov 25, 2013  3:39 PM ------      Message from: Lenore Manner      Created: Thu Nov 25, 2013  3:28 PM      Contact: [phone]       Pt is needing dr Buelah Manis to call her about her mother. Pt is also needing a refill on her nausa medication. She doesn't know the name of them  ------

## 2013-11-26 ENCOUNTER — Other Ambulatory Visit: Payer: Self-pay | Admitting: *Deleted

## 2013-11-26 DIAGNOSIS — E039 Hypothyroidism, unspecified: Secondary | ICD-10-CM

## 2013-11-26 MED ORDER — LEVOTHYROXINE SODIUM 75 MCG PO TABS: 75.0000 ug | ORAL_TABLET | Freq: Every day | ORAL | Status: DC

## 2013-12-10 ENCOUNTER — Telehealth: Payer: Self-pay | Admitting: Family Medicine

## 2013-12-10 NOTE — Telephone Encounter (Signed)
Message copied by Alycia Rossetti on Fri Dec 10, 2013  4:00 PM ------      Message from: Sheral Flow      Created: Fri Dec 10, 2013  3:43 PM      Regarding: FW: Blood clot      Contact: [phone]                   ----- Message -----         From: Lenore Manner         Sent: 12/10/2013   3:24 PM           To: Eden Lathe Six, LPN      Subject: Blood clot                                               Pick line team found a small blood clot in pts right arm and is wanting to let us know.  ------

## 2013-12-10 NOTE — Telephone Encounter (Signed)
Noted, [facility] cares for her picc line, await there notes

## 2013-12-15 ENCOUNTER — Telehealth: Payer: Self-pay | Admitting: Family Medicine

## 2013-12-15 ENCOUNTER — Telehealth: Payer: Self-pay | Admitting: *Deleted

## 2013-12-15 ENCOUNTER — Encounter: Payer: Self-pay | Admitting: *Deleted

## 2013-12-15 MED ORDER — APIXABAN 5 MG PO TABS: 5.0000 mg | ORAL_TABLET | Freq: Two times a day (BID) | ORAL | Status: DC

## 2013-12-15 NOTE — Telephone Encounter (Signed)
Received call from patient.   Reports that she has spoken to MD with [facility] about the blood clot that was found in PICC line arm.   States that MD with [facility] recommended that she F/U with PCP.   MD please advise.

## 2013-12-15 NOTE — Telephone Encounter (Signed)
I want her to start a blood thinner due to clot, Eliquis 5mg  twice a day, Okay to send script, but I want her to pick up samples first, to make sure she does not have a lot bleeding when they hook up her TPN

## 2013-12-15 NOTE — Telephone Encounter (Signed)
This encounter was created in error - please disregard.

## 2013-12-15 NOTE — Telephone Encounter (Signed)
Call placed to patient and patient made aware.   Prescription sent to pharmacy.  

## 2013-12-15 NOTE — Telephone Encounter (Signed)
Change to XARELTO 15MG  TWICE A DAY FOR 3 WEEKS, WE HAVE SAMPLES, AFTER THAT START 20MG  ONCE A DAY  Eliquis is not covered  Send script for xarelto 20mg 

## 2013-12-16 ENCOUNTER — Telehealth: Payer: Self-pay | Admitting: *Deleted

## 2013-12-16 ENCOUNTER — Other Ambulatory Visit: Payer: Self-pay | Admitting: *Deleted

## 2013-12-16 MED ORDER — RIVAROXABAN 20 MG PO TABS: 20.0000 mg | ORAL_TABLET | Freq: Every day | ORAL | Status: DC

## 2013-12-16 NOTE — Telephone Encounter (Signed)
Pt came into office today stating insurance will not cover Xarelto wants to know if there is something else you can prescribe to her that is cheaper or that insurance will cover? I called pharmacy [facility] to see if they could help and Judson Roch stated that she could not run it through to see which is cheaper until the script is called in.

## 2013-12-16 NOTE — Telephone Encounter (Signed)
Her insurance would not cover the Eliquis, therefore Xarelto was sent in, please check on this, also she should go ahead and start the samples we provided which will be enough for 3 weeks until we get the insurance figured out

## 2013-12-16 NOTE — Telephone Encounter (Signed)
Call placed to patient and patient made aware.   Prescription sent to pharmacy.  

## 2013-12-17 ENCOUNTER — Telehealth: Payer: Self-pay | Admitting: Family Medicine

## 2013-12-17 DIAGNOSIS — I82402 Acute embolism and thrombosis of unspecified deep veins of left lower extremity: Secondary | ICD-10-CM

## 2013-12-17 DIAGNOSIS — I82409 Acute embolism and thrombosis of unspecified deep veins of unspecified lower extremity: Secondary | ICD-10-CM | POA: Insufficient documentation

## 2013-12-17 MED ORDER — WARFARIN SODIUM 5 MG PO TABS: 5.0000 mg | ORAL_TABLET | Freq: Every day | ORAL | Status: DC

## 2013-12-17 NOTE — Telephone Encounter (Signed)
I spoke with patient regarding her left upper TRAM a blood clot in her need for blood thinner for about 6 months. She agrees to proceed with treatment however when she received Zaroxolyn medication she will gumline and father there are multiple lawsuits against medication she was concerned about taking this and requested something different. Advise her that we can do Coumadin therapy however she would need to come in on a regular basis for lab draws or she needs to have labs drawn by her nursing home. She agrees to this to proceed with Coumadin therapy was started 5 mg daily her TPN nurse will draw her PT/INR next Tuesday we will get this arranged. Her goal will be 2-3 and we'll treat for 6 months   Call her nurse Colletta [place], give standing order for PT/INR every Tuesday  [phone]       dX DVT

## 2013-12-17 NOTE — Telephone Encounter (Signed)
Call placed to [facility]. [place] agency is contracted by [facility] [phone] for TPN management.   Call placed to [facility] to give orders. Was advised that no nurse is available a this time, but message left to return call.

## 2013-12-17 NOTE — Telephone Encounter (Signed)
Call placed to [facility] and verbal orders given to Abigail Butts, Therapist, sports.

## 2013-12-17 NOTE — Telephone Encounter (Signed)
Insurance requested PA for supply limits. PA submitted and approved through 12/17/2014.  Faxed to pharmacy. Call placed to patient and patient made aware.

## 2013-12-21 ENCOUNTER — Telehealth: Payer: Self-pay | Admitting: *Deleted

## 2013-12-21 LAB — PROTIME-INR: INR: 1.1 (ref 0.9–1.1)

## 2013-12-21 MED ORDER — FUROSEMIDE 20 MG PO TABS: 20.0000 mg | ORAL_TABLET | Freq: Every day | ORAL | Status: DC | PRN

## 2013-12-21 NOTE — Telephone Encounter (Signed)
Call placed to patient.   Patient reports that she has increased swelling from her knee down to her ankle. States that the edema is pitting, but could not quantify how much. Reports that [facility] SN advised her to make PCP aware.   States that she has been elevating her feet as much as possible, but she has been back and forth to hospital with her mother.   MD to be made aware.

## 2013-12-21 NOTE — Telephone Encounter (Signed)
Elevate the legs, you can send lasix 20mg  po daily prn swelling , make an appt if they do not go down Also I need her PT/INR

## 2013-12-21 NOTE — Telephone Encounter (Signed)
Message copied by Sheral Flow on Tue Dec 21, 2013 12:24 PM ------      Message from: Lenore Manner      Created: Tue Dec 21, 2013 12:14 PM      Regarding: Swelling      Contact: [phone]       A lot of swelling in left foot and leg  ------

## 2013-12-21 NOTE — Telephone Encounter (Signed)
Call placed to patient and patient made aware.   Prescription sent to pharmacy.  

## 2013-12-22 ENCOUNTER — Telehealth: Payer: Self-pay | Admitting: *Deleted

## 2013-12-22 NOTE — Telephone Encounter (Signed)
Call placed to patient and patient made aware per VM.  

## 2013-12-22 NOTE — Telephone Encounter (Signed)
Received call from [place], [facility] with [facility].   Reports that she received lab results and wanted to make MD aware that patient INR is 1.1.  Nurse states that she is going to fax all other labs to [facility].

## 2013-12-22 NOTE — Telephone Encounter (Signed)
Have her take coumadin 5mg  once a day, to see what she does on a full week of coumadin Recheck next week, Dr. Dennard Schaumann will respond

## 2013-12-23 NOTE — Telephone Encounter (Signed)
Call placed to patient to make sure she got information lef ton VM.   Verbalized understanding.

## 2013-12-28 LAB — PROTIME-INR

## 2013-12-29 ENCOUNTER — Telehealth: Payer: Self-pay | Admitting: *Deleted

## 2013-12-29 LAB — PROTIME-INR
INR: 1.2 — AB (ref 0.9–1.1)
PT: 16.1

## 2013-12-29 NOTE — Telephone Encounter (Signed)
Received PT/INR results. PT: 16.1, INR: 1.2.     PA made aware and new orders obtained.  Increase Coumadin to Coumadin 10mg  PO on Wed, Thur, and Sat, and Coumadin 7.5mg  PO on Fri and Sun.  Repeat PT/INr on Monday, 01/03/2014.  Call placed to patient to make aware. [place].   Call placed to [facility] [phone], and Joelene Millin given new orders for INR.

## 2013-12-29 NOTE — Telephone Encounter (Signed)
Patient returned call and made aware.  ? ?Verbalized understanding.  ?

## 2014-01-03 ENCOUNTER — Telehealth: Payer: Self-pay | Admitting: Family Medicine

## 2014-01-03 LAB — PROTIME-INR: INR: 1.6 — AB (ref 0.9–1.1)

## 2014-01-03 MED ORDER — HYDROMORPHONE HCL 2 MG PO TABS: 2.0000 mg | ORAL_TABLET | Freq: Four times a day (QID) | ORAL | Status: DC | PRN

## 2014-01-03 NOTE — Telephone Encounter (Signed)
Prescription printed and patient made aware to come to office to pick up.  

## 2014-01-03 NOTE — Telephone Encounter (Signed)
Okay to refill? 

## 2014-01-03 NOTE — Telephone Encounter (Signed)
[phone]   Pt is needing a refill on hydrocodone   ---she also wanted to let us know that Flonnie Hailstone had passed away last Nov 04, 2022

## 2014-01-03 NOTE — Telephone Encounter (Signed)
Call placed to patient.   Reports that refill requested is for Dilaudid 2mg .   Ok to refill??  Last office visit/ refill 11/23/2013.

## 2014-01-04 ENCOUNTER — Other Ambulatory Visit: Payer: Self-pay | Admitting: Family Medicine

## 2014-01-04 MED ORDER — LORAZEPAM 0.5 MG PO TABS: 0.5000 mg | ORAL_TABLET | Freq: Every day | ORAL | Status: DC

## 2014-01-04 NOTE — Progress Notes (Signed)
Call placed to patient and patient made aware.   Call placed to [facility] and PT/INR orders given to [place].

## 2014-01-04 NOTE — Telephone Encounter (Signed)
Refill appropriate and filled per protocol. 

## 2014-01-04 NOTE — Progress Notes (Signed)
Call patient tell her that Ativan 0.5 mg was added to be taken at bedtime to help her sleep.  Regarding her Coumadin her INR is at 1.7 a like to keep it right around 2. I will have her take  10 mg on Tuesday Thursday and she will take 7.5 mg Monday Wednesday Friday Saturday Sunday she will have repeat INR next Tuesday- make sure she is not skipping any doses this affects how thin her blood will be

## 2014-01-05 ENCOUNTER — Other Ambulatory Visit: Payer: Self-pay | Admitting: *Deleted

## 2014-01-05 MED ORDER — WARFARIN SODIUM 5 MG PO TABS: 10.0000 mg | ORAL_TABLET | Freq: Every day | ORAL | Status: DC

## 2014-01-05 NOTE — Telephone Encounter (Signed)
Received call from patient.   Reports that she requires refill on Coumadin .   Refill sent.

## 2014-01-09 LAB — PROTIME-INR
INR: 2 — AB (ref 0.9–1.1)
PT: 23.6

## 2014-01-11 ENCOUNTER — Telehealth: Payer: Self-pay | Admitting: *Deleted

## 2014-01-11 ENCOUNTER — Encounter: Payer: Self-pay | Admitting: *Deleted

## 2014-01-11 LAB — PROTIME-INR: INR: 1.1 (ref 0.9–1.1)

## 2014-01-11 NOTE — Telephone Encounter (Signed)
Received PT/INR results. PT: 23.6, INR: 2.0.  MD aware and new orders obtained.   Coumadin 10mg  PO on MTTF, and Coumadin 7.5mg  PO on WSS. Repeat PT/INR on Tuesday.   Call placed to patient and patient made aware.  Call placed to [facility] and [place], RN made aware.

## 2014-01-14 ENCOUNTER — Ambulatory Visit (INDEPENDENT_AMBULATORY_CARE_PROVIDER_SITE_OTHER): Payer: Medicare Other | Admitting: Family Medicine

## 2014-01-14 ENCOUNTER — Encounter: Payer: Self-pay | Admitting: Family Medicine

## 2014-01-14 VITALS — BP 130/76 | HR 72 | Temp 98.2°F | Resp 12 | Ht 63.0 in | Wt 158.0 lb

## 2014-01-14 DIAGNOSIS — R918 Other nonspecific abnormal finding of lung field: Secondary | ICD-10-CM

## 2014-01-14 DIAGNOSIS — I82402 Acute embolism and thrombosis of unspecified deep veins of left lower extremity: Secondary | ICD-10-CM

## 2014-01-14 DIAGNOSIS — M7989 Other specified soft tissue disorders: Secondary | ICD-10-CM | POA: Insufficient documentation

## 2014-01-14 DIAGNOSIS — I82409 Acute embolism and thrombosis of unspecified deep veins of unspecified lower extremity: Secondary | ICD-10-CM

## 2014-01-14 LAB — COMPREHENSIVE METABOLIC PANEL
ALBUMIN: 2.8 g/dL — AB (ref 3.5–5.2)
ALK PHOS: 171 U/L — AB (ref 39–117)
ALT: 18 U/L (ref 0–35)
AST: 17 U/L (ref 0–37)
BUN: 29 mg/dL — ABNORMAL HIGH (ref 6–23)
CALCIUM: 8 mg/dL — AB (ref 8.4–10.5)
CO2: 26 meq/L (ref 19–32)
Chloride: 105 mEq/L (ref 96–112)
Creat: 0.78 mg/dL (ref 0.50–1.10)
GLUCOSE: 92 mg/dL (ref 70–99)
POTASSIUM: 4.7 meq/L (ref 3.5–5.3)
Sodium: 138 mEq/L (ref 135–145)
Total Bilirubin: 0.3 mg/dL (ref 0.2–1.2)
Total Protein: 6.1 g/dL (ref 6.0–8.3)

## 2014-01-14 LAB — CBC WITH DIFFERENTIAL/PLATELET
Basophils Absolute: 0 10*3/uL (ref 0.0–0.1)
Basophils Relative: 0 % (ref 0–1)
Eosinophils Absolute: 0.2 10*3/uL (ref 0.0–0.7)
Eosinophils Relative: 2 % (ref 0–5)
HEMATOCRIT: 30.2 % — AB (ref 36.0–46.0)
HEMOGLOBIN: 10.1 g/dL — AB (ref 12.0–15.0)
Lymphocytes Relative: 13 % (ref 12–46)
Lymphs Abs: 1.1 10*3/uL (ref 0.7–4.0)
MCH: 26 pg (ref 26.0–34.0)
MCHC: 33.4 g/dL (ref 30.0–36.0)
MCV: 77.8 fL — ABNORMAL LOW (ref 78.0–100.0)
MONO ABS: 0.5 10*3/uL (ref 0.1–1.0)
MONOS PCT: 6 % (ref 3–12)
NEUTROS ABS: 6.4 10*3/uL (ref 1.7–7.7)
Neutrophils Relative %: 79 % — ABNORMAL HIGH (ref 43–77)
Platelets: 498 10*3/uL — ABNORMAL HIGH (ref 150–400)
RBC: 3.88 MIL/uL (ref 3.87–5.11)
RDW: 16.2 % — ABNORMAL HIGH (ref 11.5–15.5)
WBC: 8.1 10*3/uL (ref 4.0–10.5)

## 2014-01-14 LAB — BRAIN NATRIURETIC PEPTIDE: Brain Natriuretic Peptide: 109.7 pg/mL — ABNORMAL HIGH (ref 0.0–100.0)

## 2014-01-14 MED ORDER — LEVOFLOXACIN 500 MG PO TABS: 500.0000 mg | ORAL_TABLET | Freq: Every day | ORAL | Status: DC

## 2014-01-14 MED ORDER — FUROSEMIDE 40 MG PO TABS: 40.0000 mg | ORAL_TABLET | Freq: Every day | ORAL | Status: DC | PRN

## 2014-01-14 MED ORDER — ZOLPIDEM TARTRATE 10 MG PO TABS: 10.0000 mg | ORAL_TABLET | Freq: Every evening | ORAL | Status: DC | PRN

## 2014-01-14 NOTE — Progress Notes (Signed)
Patient ID: [name], female   DOB: Dec 04, 1950, 63 y.o.   MRN: [id number]   Subjective:    Patient ID: [name], female    DOB: [birth year], 63 y.o.   MRN: [id number]  Patient presents for Legs swelling and Discuss CT results   patient here secondary to leg swelling worsened over the past month. The swelling started in her legs when she was at the hospital with her mother who recently passed away. She was at the hospital for many weeks and often with sleep sitting up in a recliner with her legs dangled. They tend to be smaller at the beginning of the day but have not gone down completely. She has the Lasix but did not know she was to take it every day. She denies any shortness of breath cough chest pain. Of note she was also had [facility] for her followup by oncology she had a CT scan done of her chest and abdomen she was told that she had an infection in her lungs and that she needed to come to my office to have this evaluated and treated do not have any records from the CT scan. She also complains of a hard nodule on her lower abdomen but was not told if anything was abnormal on the CT scan. She declines getting any type of injections into her abdomen.  Upper extremity DVT she's currently on Coumadin therapy     Review Of Systems:  GEN- denies fatigue, fever, weight loss,weakness, recent illness HEENT- denies eye drainage, change in vision, nasal discharge, CVS- denies chest pain, palpitations RESP- denies SOB, cough, wheeze ABD- denies N/V, change in stools, abd pain GU- denies dysuria, hematuria, dribbling, incontinence MSK- denies joint pain, muscle aches, injury Neuro- denies headache, dizziness, syncope, seizure activity       Objective:    BP 130/76  Pulse 72  Temp(Src) 98.2 F (36.8 C) (Oral)  Resp 12  Ht 5\' 3"  (1.6 m)  Wt 158 lb (71.668 kg)  BMI 28.00 kg/m2 GEN- NAD, alert and oriented x3 HEENT- PERRL, EOMI, non injected sclera, pink,MMM , oropharynx  clear Neck- Supple ,no JVD CVS- RRR, no murmur RESP-scattered wheeze at base, no rhonchi, no rales ABD-NABS,soft,NT,ND, bag in place EXT- 1+ pitting edema Pulses- Radial 2+, DP  2+       Assessment & Plan:      Problem List Items Addressed This Visit   Leg swelling - Primary   Relevant Orders      CBC with Differential (Completed)      Comprehensive metabolic panel (Completed)      Brain natriuretic peptide (Completed)      Note: This dictation was prepared with Dragon dictation along with smaller phrase technology. Any transcriptional errors that result from this process are unintentional.

## 2014-01-14 NOTE — Assessment & Plan Note (Signed)
She's currently on Coumadin therapy for her upper extremity DVT I will treat her for 6 months she can do off of Coumadin January 2016

## 2014-01-14 NOTE — Assessment & Plan Note (Signed)
Increase her Lasix to 40 mg once a day I will also give her some compression hose. I'll check a BNP as she did have an echocardiogram a couple years ago but there was no significant heart failure. If this is elevated then I will proceed with a repeat echocardiogram to assure that she does not develop congestive heart failure in the setting of her medical problems.

## 2014-01-14 NOTE — Assessment & Plan Note (Signed)
I do not have access to the CT scan however she is high risk and has had multiple hospitalizations for decompensation. I will go ahead and start her on the Levaquin and obtain this and results hopefully this afternoon.

## 2014-01-14 NOTE — Patient Instructions (Signed)
Take levaquin antibiotic We will call with lab results Take 2 of the 20mg  lasix once a day New sleeping pill Ambien F/U 2 weeks

## 2014-01-17 ENCOUNTER — Ambulatory Visit: Payer: Medicare Other | Admitting: Family Medicine

## 2014-01-17 LAB — PROTIME-INR
INR: 2.6 — AB (ref 0.9–1.1)
PT: 28.3

## 2014-01-18 ENCOUNTER — Encounter: Payer: Self-pay | Admitting: Family Medicine

## 2014-01-18 ENCOUNTER — Encounter: Payer: Self-pay | Admitting: *Deleted

## 2014-01-19 ENCOUNTER — Encounter: Payer: Self-pay | Admitting: Family Medicine

## 2014-01-24 ENCOUNTER — Encounter: Payer: Self-pay | Admitting: *Deleted

## 2014-01-24 ENCOUNTER — Telehealth: Payer: Self-pay | Admitting: *Deleted

## 2014-01-24 NOTE — Telephone Encounter (Signed)
Prescription was faxed to pharmacy, but pharmacy states it was not received.   Prescription re-faxed.

## 2014-01-24 NOTE — Telephone Encounter (Signed)
Pt calling in regarding support hose that should be sent to [facility] on Friday, states never received. Pt states has severe [place] and needs the order in today so that she can go get fitted for stockings today. Please advise.

## 2014-01-25 ENCOUNTER — Encounter: Payer: Self-pay | Admitting: Family Medicine

## 2014-01-25 LAB — PROTIME-INR
INR: 2.3 — AB (ref 0.9–1.1)
PT: 25.5

## 2014-01-26 ENCOUNTER — Encounter: Payer: Self-pay | Admitting: *Deleted

## 2014-01-26 ENCOUNTER — Telehealth: Payer: Self-pay | Admitting: *Deleted

## 2014-01-26 NOTE — Telephone Encounter (Signed)
Received PT/INR results.   PT:  25.5, INR: 2.3.  MD made aware and new orders obtained to continue current Coumadin dose: Coumadin 10mg  PO on MTTF, and Coumadin 7.5mg  PO on WSS. Repeat PT/INR in 1 week on M/Tue.  Call placed to patient and patient made aware. Left message for [facility] to return call.

## 2014-01-26 NOTE — Telephone Encounter (Signed)
Call returned from [place].   Results and new orders given to [facility] staff.

## 2014-01-28 ENCOUNTER — Ambulatory Visit (INDEPENDENT_AMBULATORY_CARE_PROVIDER_SITE_OTHER): Payer: Medicare Other | Admitting: Family Medicine

## 2014-01-28 ENCOUNTER — Encounter: Payer: Self-pay | Admitting: Family Medicine

## 2014-01-28 ENCOUNTER — Telehealth: Payer: Self-pay | Admitting: *Deleted

## 2014-01-28 VITALS — BP 130/68 | HR 72 | Temp 97.8°F | Resp 16 | Ht 62.5 in | Wt 154.0 lb

## 2014-01-28 DIAGNOSIS — R0989 Other specified symptoms and signs involving the circulatory and respiratory systems: Secondary | ICD-10-CM

## 2014-01-28 DIAGNOSIS — R7989 Other specified abnormal findings of blood chemistry: Secondary | ICD-10-CM

## 2014-01-28 DIAGNOSIS — I82402 Acute embolism and thrombosis of unspecified deep veins of left lower extremity: Secondary | ICD-10-CM

## 2014-01-28 DIAGNOSIS — F4321 Adjustment disorder with depressed mood: Secondary | ICD-10-CM

## 2014-01-28 DIAGNOSIS — R799 Abnormal finding of blood chemistry, unspecified: Secondary | ICD-10-CM

## 2014-01-28 DIAGNOSIS — R0600 Dyspnea, unspecified: Secondary | ICD-10-CM

## 2014-01-28 DIAGNOSIS — I82409 Acute embolism and thrombosis of unspecified deep veins of unspecified lower extremity: Secondary | ICD-10-CM

## 2014-01-28 DIAGNOSIS — E039 Hypothyroidism, unspecified: Secondary | ICD-10-CM

## 2014-01-28 DIAGNOSIS — R0609 Other forms of dyspnea: Secondary | ICD-10-CM

## 2014-01-28 DIAGNOSIS — M7989 Other specified soft tissue disorders: Secondary | ICD-10-CM

## 2014-01-28 DIAGNOSIS — R918 Other nonspecific abnormal finding of lung field: Secondary | ICD-10-CM

## 2014-01-28 MED ORDER — TRAZODONE HCL 50 MG PO TABS: 50.0000 mg | ORAL_TABLET | Freq: Every evening | ORAL | Status: DC | PRN

## 2014-01-28 MED ORDER — HYDROMORPHONE HCL 2 MG PO TABS: 2.0000 mg | ORAL_TABLET | Freq: Four times a day (QID) | ORAL | Status: DC | PRN

## 2014-01-28 MED ORDER — ONDANSETRON 8 MG PO TBDP: 8.0000 mg | ORAL_TABLET | Freq: Three times a day (TID) | ORAL | Status: DC | PRN

## 2014-01-28 MED ORDER — FUROSEMIDE 40 MG PO TABS: 40.0000 mg | ORAL_TABLET | Freq: Two times a day (BID) | ORAL | Status: DC

## 2014-01-28 NOTE — Telephone Encounter (Signed)
Message copied by Sheral Flow on Fri Jan 28, 2014  4:53 PM ------      Message from: Vic Blackbird F      Created: Fri Jan 28, 2014  4:42 PM      Regarding: Draw these labs              Please call TSH, Free T3/T4, BMET GFR with her PT/INR next week ------

## 2014-01-28 NOTE — Assessment & Plan Note (Signed)
Elevated BNP with leg swelling, obtain 2 D ECHO Increase lasix to 40mg  BID x 1 week BMET next week. Potassium supplemented by TPN

## 2014-01-28 NOTE — Telephone Encounter (Signed)
Call placed to [facility] and verbal orders given to [place], Therapist, sports.

## 2014-01-28 NOTE — Progress Notes (Signed)
Patient ID: [name], female   DOB: February 03, 1951, 63 y.o.   MRN: [id number]   Subjective:    Patient ID: [name], female    DOB: 1950/11/16, 63 y.o.   MRN: [id number]  Patient presents for 2 week F/U, Insomnia and medication review/ refill  patient here to followup interim visit for leg swelling and other comorbidities. One leg swelling is stop the dependent edema as she has been in the hospital with her mother for a significant period of time. Her BNP did come back slightly elevated. They reviewed echo that she had a which was a limited study in 2013 but there was no ejection fraction noted on it. She still has some swelling though it is much improved she is also wearing her compression hose she's currently using Lasix 40 mg once a day. She she has had some shortness of breath on and off but not persistent but does feel fatigued  2. Insomnia she's had continued insomnia since her mother passed away along with brief. She did try the Ambien however this caused weird nightmares. She's also taking the Ativan as needed  3. Upper extremity DVT she's currently on Coumadin which is therapeutic  4. Hypothyroidism she is due for repeat thyroid levels  5. Pulmonary infiltrate with reactive nodule she was treated with Levaquin basically abnormal CT scan she also had some groundglass opacities she's decreased her tobacco use we will repeat the CT scan of her chest in September    Review Of Systems:  GEN-+fatigue, fever, weight loss,weakness, recent illness HEENT- denies eye drainage, change in vision, nasal discharge, CVS- denies chest pain, palpitations RESP- occ SOB, cough, wheeze ABD- denies N/V, change in stools, abd pain GU- denies dysuria, hematuria, dribbling, incontinence MSK- denies joint pain, muscle aches, injury Neuro- denies headache, dizziness, syncope, seizure activity       Objective:    BP 130/68  Pulse 72  Temp(Src) 97.8 F (36.6 C) (Oral)  Resp 16  Ht 5'  2.5" (1.588 m)  Wt 154 lb (69.854 kg)  BMI 27.70 kg/m2 GEN- NAD, alert and oriented x3  HEENT- PERRL, EOMI, non injected sclera, pink,MMM , oropharynx clear  Neck- Supple ,no JVD  CVS- RRR, no murmur  RESP-CTAB Psych- Normal affect and mood EXT- pedal1+ pitting edema  Pulses- Radial 2+         Assessment & Plan:      Problem List Items Addressed This Visit   None      Note: This dictation was prepared with Dragon dictation along with smaller phrase technology. Any transcriptional errors that result from this process are unintentional.

## 2014-01-28 NOTE — Patient Instructions (Addendum)
Take 40mg  of lasix twice a day for 1 week  Heart ultrasound to be done Pain medication refilled  Try the trazodone for sleep F/U 3 months

## 2014-01-28 NOTE — Assessment & Plan Note (Signed)
Partially she's not getting a lot of support from her siblings at this time. Her sleep is also very poor. She will continue the Ativan I will start her on trazodone 50 mg we will discontinue the Ambien due to the side effects

## 2014-01-28 NOTE — Assessment & Plan Note (Addendum)
Recheck TFT with [place] lab draw next week

## 2014-01-28 NOTE — Assessment & Plan Note (Signed)
S/p levaquin CT of chest done on 7/13 [facility] shows ground glass opacity and PNA, needs repeat in 2 months  Non specific lymph nodes

## 2014-01-28 NOTE — Assessment & Plan Note (Signed)
PT/INR at goal 2-3

## 2014-02-01 LAB — PROTIME-INR
INR: 2 — AB (ref 0.9–1.1)
PT: 22.9

## 2014-02-04 ENCOUNTER — Ambulatory Visit (HOSPITAL_COMMUNITY): Payer: Medicare Other

## 2014-02-07 ENCOUNTER — Ambulatory Visit (HOSPITAL_COMMUNITY)
Admission: RE | Admit: 2014-02-07 | Discharge: 2014-02-07 | Disposition: A | Discharge status: Home / Self Care | Payer: Medicare Other | Source: Ambulatory Visit | Attending: Family Medicine | Admitting: Family Medicine

## 2014-02-07 DIAGNOSIS — R0989 Other specified symptoms and signs involving the circulatory and respiratory systems: Secondary | ICD-10-CM | POA: Insufficient documentation

## 2014-02-07 DIAGNOSIS — M7989 Other specified soft tissue disorders: Secondary | ICD-10-CM | POA: Diagnosis present

## 2014-02-07 DIAGNOSIS — R799 Abnormal finding of blood chemistry, unspecified: Secondary | ICD-10-CM | POA: Diagnosis not present

## 2014-02-07 DIAGNOSIS — I369 Nonrheumatic tricuspid valve disorder, unspecified: Secondary | ICD-10-CM

## 2014-02-07 DIAGNOSIS — R0609 Other forms of dyspnea: Secondary | ICD-10-CM | POA: Insufficient documentation

## 2014-02-07 DIAGNOSIS — R0600 Dyspnea, unspecified: Secondary | ICD-10-CM

## 2014-02-07 DIAGNOSIS — R7989 Other specified abnormal findings of blood chemistry: Secondary | ICD-10-CM

## 2014-02-07 NOTE — Progress Notes (Signed)
*  PRELIMINARY RESULTS* Echocardiogram 2D Echocardiogram has been performed.  Leavy Cella 02/07/2014, 2:44 PM

## 2014-02-08 LAB — PT WITH INR/FINGERSTICK
INR: 2.1
PT: 23.7

## 2014-02-08 LAB — PROTIME-INR: INR: 2.1 — AB (ref 0.9–1.1)

## 2014-02-09 ENCOUNTER — Ambulatory Visit (INDEPENDENT_AMBULATORY_CARE_PROVIDER_SITE_OTHER): Payer: Medicare Other | Admitting: Family Medicine

## 2014-02-09 ENCOUNTER — Telehealth: Payer: Self-pay | Admitting: *Deleted

## 2014-02-09 ENCOUNTER — Encounter: Payer: Self-pay | Admitting: Family Medicine

## 2014-02-09 VITALS — BP 112/64 | HR 68 | Temp 97.8°F | Resp 16 | Ht 62.5 in | Wt 148.0 lb

## 2014-02-09 DIAGNOSIS — I82402 Acute embolism and thrombosis of unspecified deep veins of left lower extremity: Secondary | ICD-10-CM

## 2014-02-09 DIAGNOSIS — I82409 Acute embolism and thrombosis of unspecified deep veins of unspecified lower extremity: Secondary | ICD-10-CM

## 2014-02-09 DIAGNOSIS — N390 Urinary tract infection, site not specified: Secondary | ICD-10-CM

## 2014-02-09 DIAGNOSIS — R300 Dysuria: Secondary | ICD-10-CM

## 2014-02-09 DIAGNOSIS — M7989 Other specified soft tissue disorders: Secondary | ICD-10-CM

## 2014-02-09 DIAGNOSIS — R634 Abnormal weight loss: Secondary | ICD-10-CM

## 2014-02-09 LAB — URINALYSIS, MICROSCOPIC ONLY
Casts: NONE SEEN
Crystals: NONE SEEN
SQUAMOUS EPITHELIAL / LPF: NONE SEEN

## 2014-02-09 LAB — URINALYSIS, ROUTINE W REFLEX MICROSCOPIC
GLUCOSE, UA: NEGATIVE mg/dL
Ketones, ur: NEGATIVE mg/dL
Nitrite: NEGATIVE
Protein, ur: 100 mg/dL — AB
Specific Gravity, Urine: 1.015 (ref 1.005–1.030)
Urobilinogen, UA: 0.2 mg/dL (ref 0.0–1.0)
pH: >9 — ABNORMAL HIGH (ref 5.0–8.0)

## 2014-02-09 MED ORDER — CIPROFLOXACIN HCL 500 MG PO TABS: 500.0000 mg | ORAL_TABLET | Freq: Two times a day (BID) | ORAL | Status: DC

## 2014-02-09 NOTE — Patient Instructions (Addendum)
I will get the thyroid results  Take the Cipro twice a day for 5 days Urology will be needed if it does not clear F/U as previous

## 2014-02-09 NOTE — Assessment & Plan Note (Signed)
I will obtain the labs that we had drawn last week from her [place] she did have repeat thyroid studies done at that time. If her thyroid levels often I will adjust her medications he does have something to do with her weight loss. Of note we also have a repeat chest CT coming up in the next month secondary to the abnormal chest CT she had at [facility] which I treated her for the infiltrate that was noted there however need to make sure that the nodules and lymphadenopathy cleared

## 2014-02-09 NOTE — Assessment & Plan Note (Signed)
PT/INRs at goal she will continue current dose of Coumadin recheck next Tuesday as she is on antibiotics

## 2014-02-09 NOTE — Assessment & Plan Note (Signed)
History recurrent urinary tract infections. She has a large amount of sediment as well as foul odor her urinalysis always looks completely infected therefore I will send for culture and see what comes out as we do typically get organisms from her back which are not always the same. I will go ahead and start her on the Cipro as I previously saw her with this coming out of her ostomy it turned into a severe infection.

## 2014-02-09 NOTE — Assessment & Plan Note (Signed)
2-D echocardiogram was normal. Her legs did improve the Lasix as well as her compression hose however she stopped using them for the past 3 days and now has pedal edema again. Advise her restart her compression hose and continue her Lasix

## 2014-02-09 NOTE — Telephone Encounter (Signed)
Received patient PT/INR results.   PT: 23.7, INR: 2.1.  MD made aware and new orders obtained: Continue Coumadin 10mg  PO on MTTF, and Coumadin 7.5mg  PO on WSS   Repeat PT/INR in 2 weeks.   Call placed to [place], [facility] SN with [facility]. Verbal orders given.   Patient has appointment scheduled for 02/09/2014. Will make aware at visit.

## 2014-02-09 NOTE — Progress Notes (Signed)
Patient ID: [name], female   DOB: Apr 28, 1951, 63 y.o.   MRN: [id number]   Subjective:    Patient ID: [name], female    DOB: 09/26/1950, 63 y.o.   MRN: [id number]  Patient presents for Possible UTI  patient here with possible UTI. She does have a colonized urostomy and back however her for the past 5 days she has noted a very without odor as well as change in her urinary pattern. Her back is filling up with thick sediment which is been yellow and green tinged she also had some mild bleeding near her ostomy stump but this is now stopped. Of note she is on Coumadin for upper extremity DVT. She's not had any severe cramping and no fever but I did note that she has lost weight since her last visit about 6 pounds.  Review Of Systems:  GEN- denies fatigue, fever, weight loss,weakness, recent illness HEENT- denies eye drainage, change in vision, nasal discharge, CVS- denies chest pain, palpitations RESP- denies SOB, cough, wheeze ABD- denies N/V, change in stools, abd pain GU- denies dysuria, hematuria, dribbling, incontinence MSK- denies joint pain, muscle aches, injury Neuro- denies headache, dizziness, syncope, seizure activity       Objective:    BP 112/64  Pulse 68  Temp(Src) 97.8 F (36.6 C) (Oral)  Resp 16  Ht 5' 2.5" (1.588 m)  Wt 148 lb (67.132 kg)  BMI 26.62 kg/m2 GEN- NAD, alert and oriented x3 CVS- RRR, no murmur RESP-CTAB ABD-NABS,soft,NT,ND, ostomy bag- yellow, very cloudy with numerous sediment strands, no stool noted, no gross blood, odor to urine EXT- 1+ pedal edema Pulses- Radial 2+        Assessment & Plan:      Problem List Items Addressed This Visit   None    Visit Diagnoses   Burning with urination    -  Primary    Relevant Orders       Urinalysis, Routine w reflex microscopic (Completed)       Urine culture       Note: This dictation was prepared with Dragon dictation along with smaller phrase technology. Any transcriptional  errors that result from this process are unintentional.

## 2014-02-11 ENCOUNTER — Telehealth: Payer: Self-pay | Admitting: *Deleted

## 2014-02-11 MED ORDER — LEVOTHYROXINE SODIUM 50 MCG PO TABS: 50.0000 ug | ORAL_TABLET | Freq: Every day | ORAL | Status: DC

## 2014-02-11 MED ORDER — FUROSEMIDE 40 MG PO TABS: 40.0000 mg | ORAL_TABLET | Freq: Two times a day (BID) | ORAL | Status: DC

## 2014-02-11 NOTE — Telephone Encounter (Signed)
Message copied by Sheral Flow on Fri Feb 11, 2014  8:14 AM ------      Message from: Vic Blackbird F      Created: Thu Feb 10, 2014 11:56 PM      Regarding: Lab results       Call pt decrease synthroid to 5mcg, I think she is still getting too much thyroid medication, hopefully this will allow her to gain some weight back            Send new script ------

## 2014-02-11 NOTE — Telephone Encounter (Signed)
Prescription sent to pharmacy. .   Call placed to patient and patient made aware.  

## 2014-02-14 ENCOUNTER — Other Ambulatory Visit: Payer: Self-pay | Admitting: Family Medicine

## 2014-02-14 ENCOUNTER — Other Ambulatory Visit: Payer: Self-pay | Admitting: *Deleted

## 2014-02-14 DIAGNOSIS — Z8551 Personal history of malignant neoplasm of bladder: Secondary | ICD-10-CM

## 2014-02-14 DIAGNOSIS — R8290 Unspecified abnormal findings in urine: Secondary | ICD-10-CM

## 2014-02-14 DIAGNOSIS — N390 Urinary tract infection, site not specified: Secondary | ICD-10-CM

## 2014-02-14 DIAGNOSIS — Z436 Encounter for attention to other artificial openings of urinary tract: Secondary | ICD-10-CM

## 2014-02-14 LAB — URINE CULTURE: Colony Count: >=100000

## 2014-02-14 LAB — PROTIME-INR: INR: 1.9 — AB (ref 0.9–1.1)

## 2014-02-22 ENCOUNTER — Encounter: Payer: Self-pay | Admitting: *Deleted

## 2014-02-22 LAB — PT WITH INR/FINGERSTICK
INR: 1.6
PT: 19.2

## 2014-02-23 ENCOUNTER — Other Ambulatory Visit: Payer: Self-pay | Admitting: Family Medicine

## 2014-02-25 ENCOUNTER — Telehealth: Payer: Self-pay | Admitting: *Deleted

## 2014-02-25 ENCOUNTER — Other Ambulatory Visit: Payer: Self-pay | Admitting: Family Medicine

## 2014-02-25 NOTE — Telephone Encounter (Signed)
Increase coumadin to 10 mg everyday except Sun, Thursday and give 7.5 mg on those days and recheck in 1 week.

## 2014-02-25 NOTE — Telephone Encounter (Signed)
Call placed to patient and patient made aware.   Call placed to [facility] with [place] and verbal orders given.

## 2014-02-25 NOTE — Telephone Encounter (Signed)
Received PT/INR results from 02/22/2014.  PT: 19.2, INR: 1.6.  Patient is currently taking Coumadin 10mg  PO MTTF, and Coumadin 7.5mg  PO WSS.   She recently completed ABTx for UTI.   MD please advise.

## 2014-02-26 NOTE — Telephone Encounter (Signed)
Refill appropriate and filled per protocol. 

## 2014-03-02 IMAGING — CR DG ABDOMEN 1V
1 series · 1 of 1 positions shown · non-contrast
Comparison: 10/07/2012

CLINICAL DATA: Evaluate for bowel obstruction.

ABDOMEN - 1 VIEW
TECHNIQUE: Following ingestion of a mixture of thin barium and
EnteroVu, serial small bowel images were obtained including spot
views of the terminal ileum.

[view not recorded]
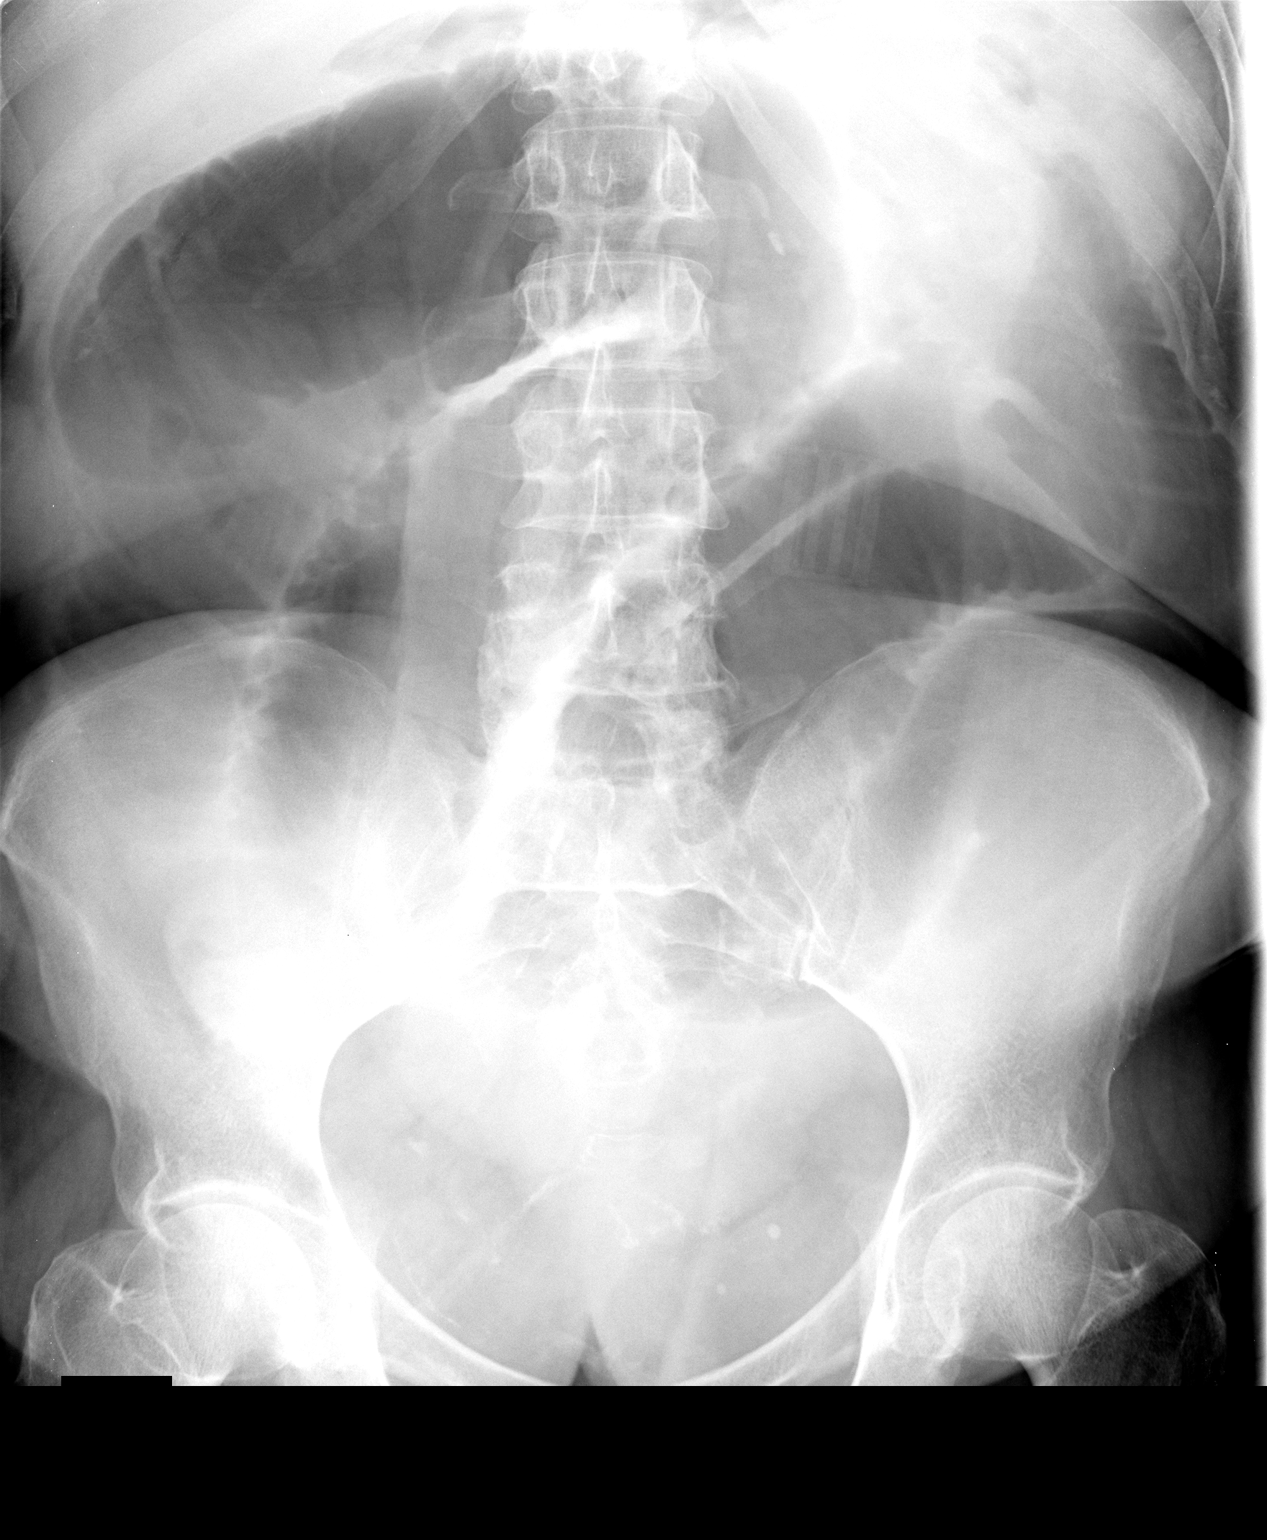

[1 of 1 positions shown; findings below may reference images not displayed]

FINDINGS: Marked small bowel dilatation identified which measures
up to 7.4 cm.  There is a paucity of colonic gas.  No free
intraperitoneal air identified.
IMPRESSION: 1.  Examination is positive for marked small bowel dilatation
consistent with small bowel obstruction.

## 2014-03-03 ENCOUNTER — Telehealth: Payer: Self-pay | Admitting: Family Medicine

## 2014-03-03 NOTE — Telephone Encounter (Signed)
Butch Penny from [facility] calling to talk with [place] about rx for this patient  [phone]

## 2014-03-03 NOTE — Telephone Encounter (Signed)
Call placed to [facility].   States that they required clarification on Levothyroxine orders.   Advised that patient is taking Levothyroxine 55mcg.

## 2014-03-08 LAB — PROTIME-INR
INR: 1.6 — AB (ref 0.9–1.1)
PT: 19.7

## 2014-03-09 ENCOUNTER — Encounter: Payer: Self-pay | Admitting: *Deleted

## 2014-03-09 ENCOUNTER — Encounter: Payer: Self-pay | Admitting: Family Medicine

## 2014-03-09 ENCOUNTER — Telehealth: Payer: Self-pay | Admitting: *Deleted

## 2014-03-09 NOTE — Telephone Encounter (Signed)
Received PT/INR results.   PT: 19.7, INR: 1.6.  MD made aware and new orders obtained: 1) confirm that patient is taking correct dose,  2) if patient is taking correct dose from last week, increase to Coumadin 7.5mg  PO Q Sunday and Coumadin 10mg  PO AOD,  3) repeat PT/INR on Tuesday.   Call placed to patient.  States that she is taking Coumadin as directed last week.   New orders given to patient and patient verbalized understanding with read back.   Call placed to [place], [facility] SN. Verbal orders given.

## 2014-03-15 ENCOUNTER — Telehealth: Payer: Self-pay | Admitting: *Deleted

## 2014-03-15 NOTE — Telephone Encounter (Signed)
Nurse called stating she checks PT/INR for pt every week and just wants to know if you still want her to draw it this week before she does it, states pt has other dr.appt and wants to be sure no one else has done it yet. Please advise.

## 2014-03-15 NOTE — Telephone Encounter (Signed)
Call placed to [place], [facility] SN.   Advised that PT/INR will need to be obtained per VM.

## 2014-03-16 ENCOUNTER — Telehealth: Payer: Self-pay | Admitting: *Deleted

## 2014-03-16 LAB — PROTIME-INR: INR: 1.9 — AB (ref 0.9–1.1)

## 2014-03-16 NOTE — Telephone Encounter (Signed)
Received PT/INR resuts.   PT: 22.7, INR: 1.9.  MD made aware and new orders obtained: Continue current dose of Coumadin- Coumadin 7.5mg  PO Sun and Coumadin 10mg  PO AOD.  Repeat PT/INR in 2 weeks.   Call placed to patient and patient made aware.   [facility] SN made aware and verbal orders given to obtain PT/INR in 2 weeks.

## 2014-03-16 NOTE — Telephone Encounter (Signed)
Received call from patient.   Reports that she continues to have dark urine.   Advised to contact urologist in regards to urostomy.   States that urologist wanted her to F/U with MD at [facility]. States that MD at [facility] thinks she may have fistula.   Advised to contact urologist with concerns. Call placed to [facility]. Concerns voiced to triage nurse who states she will F/U with patient.

## 2014-03-28 ENCOUNTER — Ambulatory Visit: Payer: Medicare Other | Admitting: Family Medicine

## 2014-03-29 LAB — PT WITH INR/FINGERSTICK
INR FINGERSTICK: 2.5
PT: 27.3

## 2014-03-30 ENCOUNTER — Ambulatory Visit: Payer: Medicare Other | Admitting: Family Medicine

## 2014-03-31 ENCOUNTER — Encounter: Payer: Self-pay | Admitting: *Deleted

## 2014-03-31 ENCOUNTER — Telehealth: Payer: Self-pay | Admitting: *Deleted

## 2014-03-31 NOTE — Telephone Encounter (Signed)
Colletta Maryland, [facility] RN returned call and was made aware.   VO given to obtain PT/INR in (2) weeks.

## 2014-03-31 NOTE — Telephone Encounter (Signed)
Received PT/INR results from 03/29/2014.  PT: 27.3, INR: 2.5.   MD made aware and new orders given: Continue Coumadin 10mg  PO Q MTWTFSat and Coumadin 7.5mg  PO Q Sun.  Repeat PT/INR in (2) weeks.  Call placed to patient to make aware. Verbalized understanding.   Call placed to [place], [facility] RN with Alvis Lemmings. [place]

## 2014-04-04 ENCOUNTER — Encounter: Payer: Self-pay | Admitting: Family Medicine

## 2014-04-04 ENCOUNTER — Ambulatory Visit (INDEPENDENT_AMBULATORY_CARE_PROVIDER_SITE_OTHER): Payer: Medicare Other | Admitting: Family Medicine

## 2014-04-04 VITALS — BP 118/72 | HR 68 | Temp 97.5°F | Resp 16 | Ht 64.57 in | Wt 144.0 lb

## 2014-04-04 DIAGNOSIS — I82402 Acute embolism and thrombosis of unspecified deep veins of left lower extremity: Secondary | ICD-10-CM

## 2014-04-04 DIAGNOSIS — R634 Abnormal weight loss: Secondary | ICD-10-CM

## 2014-04-04 DIAGNOSIS — R911 Solitary pulmonary nodule: Secondary | ICD-10-CM

## 2014-04-04 DIAGNOSIS — B9689 Other specified bacterial agents as the cause of diseases classified elsewhere: Secondary | ICD-10-CM

## 2014-04-04 DIAGNOSIS — N760 Acute vaginitis: Secondary | ICD-10-CM

## 2014-04-04 DIAGNOSIS — E038 Other specified hypothyroidism: Secondary | ICD-10-CM

## 2014-04-04 DIAGNOSIS — N898 Other specified noninflammatory disorders of vagina: Secondary | ICD-10-CM

## 2014-04-04 DIAGNOSIS — A499 Bacterial infection, unspecified: Secondary | ICD-10-CM

## 2014-04-04 LAB — WET PREP FOR TRICH, YEAST, CLUE
Trich, Wet Prep: NONE SEEN
Yeast Wet Prep HPF POC: NONE SEEN

## 2014-04-04 MED ORDER — METRONIDAZOLE 500 MG PO TABS: 500.0000 mg | ORAL_TABLET | Freq: Two times a day (BID) | ORAL | Status: DC

## 2014-04-04 MED ORDER — HYDROMORPHONE HCL 2 MG PO TABS: 2.0000 mg | ORAL_TABLET | Freq: Four times a day (QID) | ORAL | Status: DC | PRN

## 2014-04-04 NOTE — Patient Instructions (Signed)
CT of chest to be done Liquids Only  Take flagyl as prescribed  F/U 3 months

## 2014-04-05 ENCOUNTER — Encounter: Payer: Self-pay | Admitting: Family Medicine

## 2014-04-05 DIAGNOSIS — B9689 Other specified bacterial agents as the cause of diseases classified elsewhere: Secondary | ICD-10-CM | POA: Insufficient documentation

## 2014-04-05 DIAGNOSIS — N760 Acute vaginitis: Secondary | ICD-10-CM

## 2014-04-05 DIAGNOSIS — R911 Solitary pulmonary nodule: Secondary | ICD-10-CM | POA: Insufficient documentation

## 2014-04-05 NOTE — Assessment & Plan Note (Signed)
Review recent CBC CT of chest to be done TFT recently done normal She has malnourshment as on TPN as main source Recent CT abd/pelvis by [facility] unremarkable

## 2014-04-05 NOTE — Progress Notes (Signed)
Patient ID: [name], female   DOB: [birth year], 63 y.o.   MRN: [id number]   Subjective:    Patient ID: [name], female    DOB: 05-31-1951, 63 y.o.   MRN: [id number]  Patient presents for 3 month F/U and Vaginal Pain/ Burning  patient here for followup. She has lost 14 pounds since July of 2015. 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 It was also noted that there was nodular density on her CT of chest when she had underlying infection is also needs to be repeated as she is a smoker.  Today she complains of vaginal discharge with a mild odor for the past couple days and feeling irritated. She denies any vaginal bleeding  Medications were reviewed    Review Of Systems:  GEN- denies fatigue, fever, weight loss,weakness, recent illness HEENT- denies eye drainage, change in vision, nasal discharge, CVS- denies chest pain, palpitations RESP- denies SOB, cough, wheeze ABD- denies N/V, change in stools, + chronic abd pain GU- denies dysuria, hematuria, dribbling, incontinence MSK- denies joint pain, muscle aches, injury Neuro- denies headache, dizziness, syncope, seizure activity       Objective:    BP 118/72  Pulse 68  Temp(Src) 97.5 F (36.4 C) (Oral)  Resp 16  Ht 5' 4.57" (1.64 m)  Wt 144 lb (65.318 kg)  BMI 24.29 kg/m2 GEN- NAD, alert and oriented x3 HEENT- PERRL, EOMI, non injected sclera, pink conjunctiva, MMM, oropharynx clear Neck- Supple, no thyromegaly CVS- RRR, no murmur RESP-CTAB GU- normal external genitalia, vaginal mucosa pink and moist, cervix visualized no growth, no blood form os, minimal thin clear discharge, no CMT, no ovarian masses, uterus normal size ABD-NABS,soft,NT,ND, urostomy with thick stringy particles EXT- No edema Pulses- Radial 2+        Assessment & Plan:       Problem List Items Addressed This Visit   None    Visit Diagnoses   Vaginal discharge    -  Primary    Relevant Orders       WET PREP FOR [place], YEAST, CLUE (Completed)       Note: This dictation was prepared with Dragon dictation along with smaller phrase technology. Any transcriptional errors that result from this process are unintentional.

## 2014-04-05 NOTE — Assessment & Plan Note (Signed)
Treat with flagul

## 2014-04-05 NOTE — Assessment & Plan Note (Signed)
Recent TFT normal,no change to dose 

## 2014-04-05 NOTE — Assessment & Plan Note (Signed)
At goal, treat for 6 months,

## 2014-04-12 ENCOUNTER — Ambulatory Visit (HOSPITAL_COMMUNITY)
Admission: RE | Admit: 2014-04-12 | Discharge: 2014-04-12 | Disposition: A | Discharge status: Home / Self Care | Payer: Medicare Other | Source: Ambulatory Visit | Attending: Family Medicine | Admitting: Family Medicine

## 2014-04-12 ENCOUNTER — Telehealth: Payer: Self-pay | Admitting: *Deleted

## 2014-04-12 DIAGNOSIS — R634 Abnormal weight loss: Secondary | ICD-10-CM

## 2014-04-12 DIAGNOSIS — F17210 Nicotine dependence, cigarettes, uncomplicated: Secondary | ICD-10-CM | POA: Diagnosis not present

## 2014-04-12 DIAGNOSIS — R911 Solitary pulmonary nodule: Secondary | ICD-10-CM

## 2014-04-12 LAB — PROTIME-INR: INR: 5.2 — AB (ref 0.9–1.1)

## 2014-04-12 LAB — POCT I-STAT CREATININE: Creatinine, Ser: 1.2 mg/dL — ABNORMAL HIGH (ref 0.50–1.10)

## 2014-04-12 MED ORDER — IOHEXOL 300 MG/ML  SOLN
80.0000 mL | Freq: Once | INTRAMUSCULAR | Status: AC | PRN
  Administered 2014-04-12: 80 mL via INTRAVENOUS

## 2014-04-12 NOTE — Telephone Encounter (Signed)
Received PT/INR results.   PT: 48.7, INR 5.2.  MD made aware and new orders obtained to HOLD all Coumadin on Tuesday and Wednesday.   Repeat PT/INR on Thursday.   Call placed to patient and patient made aware.

## 2014-04-13 ENCOUNTER — Ambulatory Visit (HOSPITAL_COMMUNITY): Payer: Medicare Other

## 2014-04-13 ENCOUNTER — Other Ambulatory Visit: Payer: Self-pay | Admitting: Family Medicine

## 2014-04-13 ENCOUNTER — Encounter: Payer: Self-pay | Admitting: Internal Medicine

## 2014-04-13 ENCOUNTER — Other Ambulatory Visit: Payer: Self-pay | Admitting: *Deleted

## 2014-04-13 DIAGNOSIS — J384 Edema of larynx: Secondary | ICD-10-CM

## 2014-04-13 DIAGNOSIS — J387 Other diseases of larynx: Secondary | ICD-10-CM

## 2014-04-13 MED ORDER — PRAVASTATIN SODIUM 20 MG PO TABS: 20.0000 mg | ORAL_TABLET | Freq: Every day | ORAL | Status: DC

## 2014-04-13 NOTE — Telephone Encounter (Signed)
Call received from [place], [place] with Alvis Lemmings.   Verbal orders given per MD order.

## 2014-04-14 LAB — PROTIME-INR: INR: 3.1 — AB (ref 0.9–1.1)

## 2014-04-15 ENCOUNTER — Ambulatory Visit (INDEPENDENT_AMBULATORY_CARE_PROVIDER_SITE_OTHER): Payer: Medicare Other | Admitting: Family Medicine

## 2014-04-15 ENCOUNTER — Telehealth: Payer: Self-pay | Admitting: Family Medicine

## 2014-04-15 ENCOUNTER — Encounter: Payer: Self-pay | Admitting: Family Medicine

## 2014-04-15 ENCOUNTER — Telehealth: Payer: Self-pay | Admitting: *Deleted

## 2014-04-15 VITALS — BP 136/72 | HR 64 | Temp 97.6°F | Resp 12 | Ht 64.5 in | Wt 144.0 lb

## 2014-04-15 DIAGNOSIS — R1084 Generalized abdominal pain: Secondary | ICD-10-CM

## 2014-04-15 LAB — CBC W/MCH & 3 PART DIFF
HCT: 33.2 % — ABNORMAL LOW (ref 36.0–46.0)
Hemoglobin: 12.5 g/dL (ref 12.0–15.0)
LYMPHS ABS: 1.5 10*3/uL (ref 0.7–4.0)
LYMPHS PCT: 28 % (ref 12–46)
MCH: 27.8 pg (ref 26.0–34.0)
MCHC: 37.7 g/dL — ABNORMAL HIGH (ref 30.0–36.0)
MCV: 73.9 fL — ABNORMAL LOW (ref 78.0–100.0)
NEUTROS PCT: 63 % (ref 43–77)
Neutro Abs: 3.5 10*3/uL (ref 1.7–7.7)
Platelets: 371 10*3/uL (ref 150–400)
RBC: 4.49 MIL/uL (ref 3.87–5.11)
RDW: 16.9 % — ABNORMAL HIGH (ref 11.5–15.5)
WBC mixed population %: 9 % (ref 3–18)
WBC mixed population: 0.5 10*3/uL (ref 0.1–1.8)
WBC: 5.5 10*3/uL (ref 4.0–10.5)

## 2014-04-15 LAB — COMPREHENSIVE METABOLIC PANEL
ALBUMIN: 3.7 g/dL (ref 3.5–5.2)
ALK PHOS: 145 U/L — AB (ref 39–117)
ALT: 14 U/L (ref 0–35)
AST: 21 U/L (ref 0–37)
BUN: 24 mg/dL — ABNORMAL HIGH (ref 6–23)
CO2: 27 mEq/L (ref 19–32)
Calcium: 9 mg/dL (ref 8.4–10.5)
Chloride: 103 mEq/L (ref 96–112)
Creat: 1.01 mg/dL (ref 0.50–1.10)
GLUCOSE: 94 mg/dL (ref 70–99)
POTASSIUM: 4.5 meq/L (ref 3.5–5.3)
SODIUM: 138 meq/L (ref 135–145)
TOTAL PROTEIN: 6.7 g/dL (ref 6.0–8.3)
Total Bilirubin: 0.4 mg/dL (ref 0.2–1.2)

## 2014-04-15 MED ORDER — CIPROFLOXACIN HCL 500 MG PO TABS: 500.0000 mg | ORAL_TABLET | Freq: Two times a day (BID) | ORAL | Status: DC

## 2014-04-15 NOTE — Telephone Encounter (Signed)
Patient would like to speak with you regarding her back pain  [phone]

## 2014-04-15 NOTE — Telephone Encounter (Signed)
Call placed to patient.   Reports that she is having back pain and with her fistula, she was not sure if she should come in to office, or if she should go to the ER.   Advised to come in at 3:45 today for MD to check.

## 2014-04-15 NOTE — Telephone Encounter (Signed)
Received PT/INR results.  PT: 32.5/INR: 3.1  MD made aware and new orders obtained.   HOLD all Coumadin on Friday and Saturday.  Begin new Coumadin dose on Sunday: Coumadin 7.5mg  PO Sun and Wed and Coumadin 10mg  PO Mon, Tues, Thurs, Fri, Sat.   Repeat PT/INR on Thursday.   Colletta Maryland, [facility] RN made aware.   Call placed to patient. [place].

## 2014-04-15 NOTE — Patient Instructions (Signed)
Do not take coumadin for next 2 days Start Sunday take 7.5mg  Sun and Wed Take 10mg  M, Tues, Thurs, Fri, Sat Take antibiotics as prescribed You can try taking 2 pain meds F/U as previous

## 2014-04-15 NOTE — Telephone Encounter (Signed)
Patient returned call and made aware.

## 2014-04-17 NOTE — Progress Notes (Signed)
Patient ID: [name], female   DOB: 29-Oct-1950, 63 y.o.   MRN: [id number]   Subjective:    Patient ID: [name], female    DOB: 16-Sep-1950, 63 y.o.   MRN: [id number]  Patient presents for Back Pain   Pt here with increased abd pain, nausea, back pain. Her surgeons at [facility] are concerned for a fistula between her bowel and bladder due to increased sediment and gas in her urostomy bag. She denies any fever has been on clear fluids only for past few weeks. Passing gas, stools are unchanged. She was advised to go to ER or go to PCP if she had certain symptoms of concern Note she was treated for BV about 10 days ago, also coumadin was supratherapuetic this week, no blood in urine or other bleeding noted    Review Of Systems:  GEN- denies fatigue, fever, weight loss,weakness, recent illness HEENT- denies eye drainage, change in vision, nasal discharge, CVS- denies chest pain, palpitations RESP- denies SOB, cough, wheeze ABD- + N/V, change in stools,+ abd pain GU- denies dysuria, hematuria, dribbling, incontinence MSK- denies joint pain, muscle aches, injury Neuro- denies headache, dizziness, syncope, seizure activity       Objective:    BP 136/72  Pulse 64  Temp(Src) 97.6 F (36.4 C) (Oral)  Resp 12  Ht 5' 4.5" (1.638 m)  Wt 144 lb (65.318 kg)  BMI 24.34 kg/m2 GEN- NAD, alert and oriented x3 HEENT- PERRL, EOMI, non injected sclera, pink conjunctiva, MMM, oropharynx clear CVS- RRR, no murmur RESP-CTAB ABD-NABS,soft,mild TTP difusely,ND, urostomy with thick stringy particles, Neg CVA tenderness Spine- spine NT EXT- No edema Pulses- Radial 2+       Assessment & Plan:      Problem List Items Addressed This Visit   None    Visit Diagnoses   Generalized abdominal pain    -  Primary    Possible due to fistula vs another SBO which she has had numerous ones, UTI due to colonization. Will start cipro, recently completed flagyl, appt with [facility] on Wed, CBC  w diff in office unremarkable, afrebile, advised to go to ER if vomiting starts or pain worsens, she has nausea meds and dilaudid for pain    Relevant Orders       CBC w/[facility] & 3 Part Diff (Completed)       Comprehensive metabolic panel (Completed)       Note: This dictation was prepared with Dragon dictation along with smaller phrase technology. Any transcriptional errors that result from this process are unintentional.

## 2014-04-20 ENCOUNTER — Encounter: Payer: Self-pay | Admitting: Family Medicine

## 2014-04-27 ENCOUNTER — Telehealth: Payer: Self-pay | Admitting: *Deleted

## 2014-04-27 NOTE — Telephone Encounter (Signed)
PT/INR results have not been received.   Call placed to [facility] and new orders given to obtain PT/INR this week.

## 2014-04-27 NOTE — Telephone Encounter (Signed)
Colletta Maryland from [place] called wanting to know if you want her to draw PT/INR today or want next week.. If so needs orders. Please call back and if she does not answer please leave message.  [phone]

## 2014-05-02 ENCOUNTER — Telehealth: Payer: Self-pay | Admitting: *Deleted

## 2014-05-02 NOTE — Telephone Encounter (Signed)
Call received from [place], [facility] RN. Advised that she did not receive order for PT/INR. VO given to have labs drawn ASAP.   Also advised that patient has been noted with a large amount of stool in ostomy bag. Reports that she was noted to have small hole in stoma. States that patient is scheduled for surgery on 05/13/2014 to repair stoma.   MD to be made aware.

## 2014-05-02 NOTE — Telephone Encounter (Signed)
noted 

## 2014-05-03 LAB — PROTIME-INR: INR: 2.3 — AB (ref 0.9–1.1)

## 2014-05-06 ENCOUNTER — Telehealth: Payer: Self-pay | Admitting: *Deleted

## 2014-05-06 NOTE — Telephone Encounter (Signed)
Received PT/INR results.   PT: 25.6, INR: 2.3.   MD made aware and new orders obtained.   Continue Coumadin 7.5mg  PO Q WSun and Coumadin 10mg  PO Q MTTFSat..   Patient has surgery scheduled to fix ostomy stoma on 05/13/2014. Patient is to stop all Coumadin 6 days prior to surgery (05/08/2014). MD at [facility] will advise as to when patient should resume Coumadin.   Repeat PT/INR in 2 weeks.   Call placed to patient and patient made aware.   Order faxed to [facility].

## 2014-05-09 ENCOUNTER — Other Ambulatory Visit: Payer: Self-pay | Admitting: Family Medicine

## 2014-05-09 NOTE — Telephone Encounter (Signed)
Refill appropriate and filled per protocol. 

## 2014-05-10 LAB — PROTIME-INR: INR: 1.2 — AB (ref 0.9–1.1)

## 2014-05-18 ENCOUNTER — Encounter: Payer: Self-pay | Admitting: Family Medicine

## 2014-05-20 ENCOUNTER — Telehealth: Payer: Self-pay | Admitting: *Deleted

## 2014-05-20 NOTE — Telephone Encounter (Signed)
Call placed to patient to inquire as to surgical outcome and as to when surgeon recommended Coumadin to be resumed.   Call placed to patient. [name].

## 2014-05-23 ENCOUNTER — Encounter: Payer: Self-pay | Admitting: Family Medicine

## 2014-05-23 NOTE — Telephone Encounter (Signed)
Call placed to patient. Spoke with patient daughter.   Advised that patient had difficulty with respirations after surgery and was moved to ICU. Advised that respiratory distress has now resolved, but patient continues to have difficulty with TPN. Reports that nutrition is not absorbing at this time. Patient remains in ICU.   MD to be made aware.

## 2014-05-23 NOTE — Telephone Encounter (Signed)
noted 

## 2014-06-06 ENCOUNTER — Other Ambulatory Visit: Payer: Self-pay | Admitting: Family Medicine

## 2014-06-06 NOTE — Telephone Encounter (Signed)
Refill appropriate and filled per protocol. 

## 2014-06-27 ENCOUNTER — Ambulatory Visit (INDEPENDENT_AMBULATORY_CARE_PROVIDER_SITE_OTHER): Payer: Medicare Other | Admitting: Family Medicine

## 2014-06-27 ENCOUNTER — Telehealth: Payer: Self-pay | Admitting: *Deleted

## 2014-06-27 ENCOUNTER — Encounter: Payer: Self-pay | Admitting: Family Medicine

## 2014-06-27 VITALS — BP 128/72 | HR 78 | Temp 97.5°F | Resp 16 | Ht 64.5 in | Wt 136.0 lb

## 2014-06-27 DIAGNOSIS — I82402 Acute embolism and thrombosis of unspecified deep veins of left lower extremity: Secondary | ICD-10-CM

## 2014-06-27 DIAGNOSIS — D62 Acute posthemorrhagic anemia: Secondary | ICD-10-CM

## 2014-06-27 DIAGNOSIS — K118 Other diseases of salivary glands: Secondary | ICD-10-CM | POA: Insufficient documentation

## 2014-06-27 DIAGNOSIS — K5669 Other intestinal obstruction: Secondary | ICD-10-CM

## 2014-06-27 DIAGNOSIS — K56600 Partial intestinal obstruction, unspecified as to cause: Secondary | ICD-10-CM

## 2014-06-27 DIAGNOSIS — R220 Localized swelling, mass and lump, head: Secondary | ICD-10-CM

## 2014-06-27 DIAGNOSIS — L988 Other specified disorders of the skin and subcutaneous tissue: Secondary | ICD-10-CM

## 2014-06-27 DIAGNOSIS — E46 Unspecified protein-calorie malnutrition: Secondary | ICD-10-CM

## 2014-06-27 MED ORDER — HYDROMORPHONE HCL 2 MG PO TABS: 2.0000 mg | ORAL_TABLET | Freq: Four times a day (QID) | ORAL | Status: DC | PRN

## 2014-06-27 NOTE — Assessment & Plan Note (Signed)
No mass on exam, previous swelling resolved, ? Biopsy scheduled at [facility]

## 2014-06-27 NOTE — Assessment & Plan Note (Signed)
S/p surgery, f/u with surgeon regarding wound, no surgical infection noted, small areas of subcu fat seen  Wound vac per surgery Refilled Dilaudid

## 2014-06-27 NOTE — Telephone Encounter (Signed)
Patient seen in office.   Reports that she has not been able to fill Eliquis.   Call placed to pharmacy. Advised that prescription requires PA.   PA submitted.   Patient has tried Coumadin, but not that she is eating, INR's are unstable.   Dx: I82.409.  PA approved.   06/27/2014- 12/27/2014.  [id number].  $6.60 per month.

## 2014-06-27 NOTE — Assessment & Plan Note (Signed)
PA done on Eliquis, she will continue blood thinners, I need to get actual DC summary to put things together

## 2014-06-27 NOTE — Assessment & Plan Note (Signed)
Chronic SBO, moving loose stools, I am concerned she is able to eat currently, she is still having some nausea one episode of emesis. We discussed making sure that she is eating soft foods and very small meals. She no longer has TPN for nutrition therefore we will also augment her with boost twice a day and a multivitamin chewable. She will follow-up with [facility] check her hemoglobin as she was status post transfusions to make sure she is maintaining her hemoglobin with her fatigue.

## 2014-06-27 NOTE — Progress Notes (Signed)
Patient ID: [name], female   DOB: 02/08/1951, 63 y.o.   MRN: [id number]   Subjective:    Patient ID: [name], female    DOB: May 06, 1951, 63 y.o.   MRN: [id number]  Patient presents for Hospital F/U patient here for hospital follow-up.she was hospitalized for possibly 1 month secondary to 2 fistulas. One of the fistulas was producing stool into her urostomy bag. She states that she had part of her intestines removed she had 2 wound vacs now currently has one in the lower part of the abdomen however there are some areas where her wound is dehiscing on the upper abdomen. She has a home [employer], applications. Her pain is not controlled with oxycodone a shift she requests refill on her diet a lot it. She has lost about 8 pounds since her last visit and she was told that she no longer needs TPN and her PICC line was removed and that she should eat by mouth. She is having difficulty keeping up with her intake as well as her fluids. She is very fatigued and continues to have a lot of gas that she is passing bowel movements which is mostly loose stool. She has not had any fever or shortness of breath. She's had some nausea with 1 episode of vomiting since she has been home which has been about 10 days now. Her home health nurse is arranged to follow with her surgeon regarding the wound per above. She was transfused 3 units during surgery**  She was switched from Coumadin to Eliquis because of a second DVT found in her upper extremity after they removed her PICC line.however she has not had this medication in the past couple of days secondary to a prior authorization needed.  From the records that I could see from care everywhere she also had swelling on the right side of her neck ultrasound and CT of neck was done which showed a parotid mass which was causing some compression on the internal jugular swelling went down and they're planning to have a biopsy   Review Of Systems:  GEN- denies fatigue,  fever, weight loss,weakness, recent illness HEENT- denies eye drainage, change in vision, nasal discharge, CVS- denies chest pain, palpitations RESP- denies SOB, cough, wheeze ABD- denies N/V, change in stools,+ abd pain GU- denies dysuria, hematuria, dribbling, incontinence MSK- denies joint pain, muscle aches, injury Neuro- denies headache, dizziness, syncope, seizure activity       Objective:    BP 128/72 mmHg  Pulse 78  Temp(Src) 97.5 F (36.4 C) (Oral)  Resp 16  Ht 5' 4.5" (1.638 m)  Wt 136 lb (61.689 kg)  BMI 22.99 kg/m2 GEN- NAD, alert and oriented x3,fatigued appearing, weight loss HEENT- PERRL, EOMI, non injected sclera, pink conjunctiva, MMM, oropharynx clear Neck- Supple, no parotid mass palpated CVS- RRR, no murmur RESP-CTAB ABD-NABS soft, Lower abd wound with wound vac, urostomy, small amount of blood in bag, upper incision 2 small areas of subcutaneous fat peaking through, no discharge, no active bleeding EXT- No edema Pulses- Radial, DP- 2+        Assessment & Plan:      Problem List Items Addressed This Visit      Unprioritized   DVT (deep venous thrombosis)   Relevant Medications      apixaban (ELIQUIS) 5 MG TABS tablet    Other Visit Diagnoses    Postoperative anemia due to acute blood loss    -  Primary    Relevant  Orders       CBC with Differential       Comprehensive metabolic panel       Note: This dictation was prepared with Dragon dictation along with smaller phrase technology. Any transcriptional errors that result from this process are unintentional.

## 2014-06-27 NOTE — Patient Instructions (Signed)
Protein for muscles and weight Boost twice a day - 2 a day  Multivitamin Pain medication We will call with lab results Release of records- Discharge Summary- [facility] F/U 4 weeks for weight

## 2014-06-28 LAB — COMPREHENSIVE METABOLIC PANEL
ALBUMIN: 3.2 g/dL — AB (ref 3.5–5.2)
ALK PHOS: 116 U/L (ref 39–117)
ALT: 14 U/L (ref 0–35)
AST: 15 U/L (ref 0–37)
BUN: 11 mg/dL (ref 6–23)
CHLORIDE: 100 meq/L (ref 96–112)
CO2: 21 mEq/L (ref 19–32)
Calcium: 8.2 mg/dL — ABNORMAL LOW (ref 8.4–10.5)
Creat: 0.9 mg/dL (ref 0.50–1.10)
Glucose, Bld: 81 mg/dL (ref 70–99)
Potassium: 3.4 mEq/L — ABNORMAL LOW (ref 3.5–5.3)
Sodium: 131 mEq/L — ABNORMAL LOW (ref 135–145)
Total Bilirubin: 0.6 mg/dL (ref 0.2–1.2)
Total Protein: 7.5 g/dL (ref 6.0–8.3)

## 2014-06-28 LAB — CBC WITH DIFFERENTIAL/PLATELET
Basophils Absolute: 0 10*3/uL (ref 0.0–0.1)
Basophils Relative: 0 % (ref 0–1)
EOS ABS: 0.1 10*3/uL (ref 0.0–0.7)
Eosinophils Relative: 1 % (ref 0–5)
HCT: 33.7 % — ABNORMAL LOW (ref 36.0–46.0)
Hemoglobin: 11.1 g/dL — ABNORMAL LOW (ref 12.0–15.0)
LYMPHS ABS: 1.8 10*3/uL (ref 0.7–4.0)
Lymphocytes Relative: 24 % (ref 12–46)
MCH: 26.5 pg (ref 26.0–34.0)
MCHC: 32.9 g/dL (ref 30.0–36.0)
MCV: 80.4 fL (ref 78.0–100.0)
MONOS PCT: 10 % (ref 3–12)
MPV: 9 fL — ABNORMAL LOW (ref 9.4–12.4)
Monocytes Absolute: 0.7 10*3/uL (ref 0.1–1.0)
Neutro Abs: 4.7 10*3/uL (ref 1.7–7.7)
Neutrophils Relative %: 65 % (ref 43–77)
Platelets: 498 10*3/uL — ABNORMAL HIGH (ref 150–400)
RBC: 4.19 MIL/uL (ref 3.87–5.11)
RDW: 17.6 % — ABNORMAL HIGH (ref 11.5–15.5)
WBC: 7.3 10*3/uL (ref 4.0–10.5)

## 2014-06-29 ENCOUNTER — Other Ambulatory Visit: Payer: Self-pay | Admitting: *Deleted

## 2014-06-29 MED ORDER — POTASSIUM CHLORIDE CRYS ER 20 MEQ PO TBCR: 20.0000 meq | EXTENDED_RELEASE_TABLET | Freq: Every day | ORAL | Status: DC

## 2014-07-01 DIAGNOSIS — T8189XA Other complications of procedures, not elsewhere classified, initial encounter: Secondary | ICD-10-CM | POA: Diagnosis not present

## 2014-07-01 DIAGNOSIS — K626 Ulcer of anus and rectum: Secondary | ICD-10-CM

## 2014-07-01 DIAGNOSIS — Z8551 Personal history of malignant neoplasm of bladder: Secondary | ICD-10-CM | POA: Diagnosis not present

## 2014-07-01 HISTORY — DX: Ulcer of anus and rectum: K62.6

## 2014-07-02 DIAGNOSIS — R269 Unspecified abnormalities of gait and mobility: Secondary | ICD-10-CM | POA: Diagnosis not present

## 2014-07-04 DIAGNOSIS — R269 Unspecified abnormalities of gait and mobility: Secondary | ICD-10-CM | POA: Diagnosis not present

## 2014-07-05 ENCOUNTER — Ambulatory Visit (INDEPENDENT_AMBULATORY_CARE_PROVIDER_SITE_OTHER): Payer: 59 | Admitting: Family Medicine

## 2014-07-05 ENCOUNTER — Encounter: Payer: Self-pay | Admitting: Family Medicine

## 2014-07-05 VITALS — BP 124/76 | HR 78 | Temp 98.0°F | Resp 16 | Ht 65.0 in | Wt 142.0 lb

## 2014-07-05 DIAGNOSIS — G8929 Other chronic pain: Secondary | ICD-10-CM | POA: Diagnosis not present

## 2014-07-05 DIAGNOSIS — E46 Unspecified protein-calorie malnutrition: Secondary | ICD-10-CM | POA: Diagnosis not present

## 2014-07-05 DIAGNOSIS — R109 Unspecified abdominal pain: Secondary | ICD-10-CM | POA: Diagnosis not present

## 2014-07-05 DIAGNOSIS — E440 Moderate protein-calorie malnutrition: Secondary | ICD-10-CM | POA: Diagnosis not present

## 2014-07-05 MED ORDER — HYDROMORPHONE HCL 4 MG PO TABS: 4.0000 mg | ORAL_TABLET | ORAL | Status: DC | PRN

## 2014-07-05 NOTE — Patient Instructions (Signed)
Continue current medications  Dilaudid increased to 4mg  twice a day as needed for pain  No fried foods Avoid the lettuce/salad, Red meat, pork  Simethicone for gas  Change f/u to 8 weeks

## 2014-07-05 NOTE — Assessment & Plan Note (Deleted)
Some weight gain noted, discussed foods to avoid to decrease chance of abd pain , gas bloating, she has not used digestive system in many years , I think she is overdoign it Continue MVI

## 2014-07-05 NOTE — Progress Notes (Signed)
Patient ID: [name], female   DOB: 03-Aug-1950, 64 y.o.   MRN: [id number]   Subjective:    Patient ID: [name], female    DOB: 1951-01-07, 64 y.o.   MRN: [id number]  Patient presents for F/U and Medication Review here for an interim visit she was actually supposed to come back in 4 weeks became in today. Her weight is up a few pounds however she has been increasing the use of her pain medicine due to pain with eating. There are certain foods to make her very gassy and distended and she has been taking pain medicine around the clock which is not typical for her. She's actually used up almost the 45 tablets from a week ago because she takes 2 at a time. She is found that red meat as well as salad does not seem to digest very well very cold liquids also cause her pain. She is still passing her bowels as before about 4 times a day she still also has the wound VAC in place and her incision is being monitored by the surgery team and her home health nurse.    Review Of Systems:  GEN- denies fatigue, fever, weight loss,weakness, recent illness HEENT- denies eye drainage, change in vision, nasal discharge, CVS- denies chest pain, palpitations RESP- denies SOB, cough, wheeze ABD- denies N/V, change in stools, +abd pain GU- denies dysuria, hematuria, dribbling, incontinence MSK- denies joint pain, muscle aches, injury Neuro- denies headache, dizziness, syncope, seizure activity       Objective:    BP 124/76 mmHg  Pulse 78  Temp(Src) 98 F (36.7 C) (Oral)  Resp 16  Ht 5\' 5"  (1.651 m)  Wt 142 lb (64.411 kg)  BMI 23.63 kg/m2 GEN- NAD, alert and oriented x3 CVS- RRR, no murmur RESP-CTAB ABD-NABS soft, NT, Lower abd wound with wound vac, urostomy, in tact, upper incision 2 small areas of subcutaneous fat peaking through,         Assessment & Plan:      Problem List Items Addressed This Visit    None      Note: This dictation was prepared with Dragon dictation along  with smaller phrase technology. Any transcriptional errors that result from this process are unintentional.

## 2014-07-05 NOTE — Assessment & Plan Note (Signed)
Some weight gain noted, discussed foods to avoid to decrease chance of abd pain , gas bloating, she has not used digestive system in many years , I think she is overdoign it Continue MVI

## 2014-07-05 NOTE — Assessment & Plan Note (Signed)
Change dilaudid to 4mg , discussed per above regarding intake and effect on abd pain Wound vac and post op per her surgeons orders

## 2014-07-06 DIAGNOSIS — R269 Unspecified abnormalities of gait and mobility: Secondary | ICD-10-CM | POA: Diagnosis not present

## 2014-07-08 DIAGNOSIS — R269 Unspecified abnormalities of gait and mobility: Secondary | ICD-10-CM | POA: Diagnosis not present

## 2014-07-09 ENCOUNTER — Emergency Department (HOSPITAL_COMMUNITY)
Admission: EM | Admit: 2014-07-09 | Discharge: 2014-07-09 | Disposition: A | Discharge status: Other Acute Inpatient Hospital | Payer: Medicare Other | Attending: Emergency Medicine | Admitting: Emergency Medicine

## 2014-07-09 ENCOUNTER — Emergency Department (HOSPITAL_COMMUNITY): Payer: Medicare Other

## 2014-07-09 ENCOUNTER — Encounter (HOSPITAL_COMMUNITY): Payer: Self-pay | Admitting: Emergency Medicine

## 2014-07-09 DIAGNOSIS — Z886 Allergy status to analgesic agent status: Secondary | ICD-10-CM | POA: Diagnosis not present

## 2014-07-09 DIAGNOSIS — Z6824 Body mass index (BMI) 24.0-24.9, adult: Secondary | ICD-10-CM | POA: Diagnosis not present

## 2014-07-09 DIAGNOSIS — Z7901 Long term (current) use of anticoagulants: Secondary | ICD-10-CM | POA: Insufficient documentation

## 2014-07-09 DIAGNOSIS — K651 Peritoneal abscess: Secondary | ICD-10-CM | POA: Diagnosis not present

## 2014-07-09 DIAGNOSIS — Z87891 Personal history of nicotine dependence: Secondary | ICD-10-CM | POA: Insufficient documentation

## 2014-07-09 DIAGNOSIS — R531 Weakness: Secondary | ICD-10-CM | POA: Insufficient documentation

## 2014-07-09 DIAGNOSIS — Z86718 Personal history of other venous thrombosis and embolism: Secondary | ICD-10-CM | POA: Diagnosis not present

## 2014-07-09 DIAGNOSIS — E441 Mild protein-calorie malnutrition: Secondary | ICD-10-CM | POA: Diagnosis not present

## 2014-07-09 DIAGNOSIS — Z880 Allergy status to penicillin: Secondary | ICD-10-CM | POA: Diagnosis not present

## 2014-07-09 DIAGNOSIS — R1084 Generalized abdominal pain: Secondary | ICD-10-CM | POA: Diagnosis not present

## 2014-07-09 DIAGNOSIS — Z86711 Personal history of pulmonary embolism: Secondary | ICD-10-CM | POA: Diagnosis not present

## 2014-07-09 DIAGNOSIS — E876 Hypokalemia: Secondary | ICD-10-CM | POA: Diagnosis not present

## 2014-07-09 DIAGNOSIS — E780 Pure hypercholesterolemia: Secondary | ICD-10-CM | POA: Insufficient documentation

## 2014-07-09 DIAGNOSIS — R1033 Periumbilical pain: Secondary | ICD-10-CM | POA: Diagnosis not present

## 2014-07-09 DIAGNOSIS — K219 Gastro-esophageal reflux disease without esophagitis: Secondary | ICD-10-CM | POA: Insufficient documentation

## 2014-07-09 DIAGNOSIS — Z79899 Other long term (current) drug therapy: Secondary | ICD-10-CM | POA: Insufficient documentation

## 2014-07-09 DIAGNOSIS — Z8551 Personal history of malignant neoplasm of bladder: Secondary | ICD-10-CM | POA: Diagnosis not present

## 2014-07-09 DIAGNOSIS — K5660 Unspecified intestinal obstruction: Secondary | ICD-10-CM | POA: Diagnosis not present

## 2014-07-09 DIAGNOSIS — Z882 Allergy status to sulfonamides status: Secondary | ICD-10-CM | POA: Diagnosis not present

## 2014-07-09 DIAGNOSIS — E039 Hypothyroidism, unspecified: Secondary | ICD-10-CM | POA: Insufficient documentation

## 2014-07-09 DIAGNOSIS — Z7982 Long term (current) use of aspirin: Secondary | ICD-10-CM | POA: Diagnosis not present

## 2014-07-09 DIAGNOSIS — Z8542 Personal history of malignant neoplasm of other parts of uterus: Secondary | ICD-10-CM | POA: Diagnosis not present

## 2014-07-09 DIAGNOSIS — I10 Essential (primary) hypertension: Secondary | ICD-10-CM | POA: Insufficient documentation

## 2014-07-09 DIAGNOSIS — R197 Diarrhea, unspecified: Secondary | ICD-10-CM | POA: Diagnosis not present

## 2014-07-09 DIAGNOSIS — E785 Hyperlipidemia, unspecified: Secondary | ICD-10-CM | POA: Diagnosis not present

## 2014-07-09 DIAGNOSIS — N739 Female pelvic inflammatory disease, unspecified: Secondary | ICD-10-CM | POA: Diagnosis not present

## 2014-07-09 DIAGNOSIS — Z87440 Personal history of urinary (tract) infections: Secondary | ICD-10-CM | POA: Diagnosis not present

## 2014-07-09 DIAGNOSIS — Z883 Allergy status to other anti-infective agents status: Secondary | ICD-10-CM | POA: Diagnosis not present

## 2014-07-09 DIAGNOSIS — R109 Unspecified abdominal pain: Secondary | ICD-10-CM | POA: Diagnosis not present

## 2014-07-09 DIAGNOSIS — K529 Noninfective gastroenteritis and colitis, unspecified: Secondary | ICD-10-CM | POA: Diagnosis not present

## 2014-07-09 LAB — CBC WITH DIFFERENTIAL/PLATELET
Basophils Absolute: 0 10*3/uL (ref 0.0–0.1)
Basophils Relative: 0 % (ref 0–1)
EOS PCT: 1 % (ref 0–5)
Eosinophils Absolute: 0.1 10*3/uL (ref 0.0–0.7)
HCT: 31 % — ABNORMAL LOW (ref 36.0–46.0)
Hemoglobin: 10.5 g/dL — ABNORMAL LOW (ref 12.0–15.0)
LYMPHS PCT: 21 % (ref 12–46)
Lymphs Abs: 1.6 10*3/uL (ref 0.7–4.0)
MCH: 26.7 pg (ref 26.0–34.0)
MCHC: 33.9 g/dL (ref 30.0–36.0)
MCV: 78.9 fL (ref 78.0–100.0)
MONO ABS: 0.4 10*3/uL (ref 0.1–1.0)
Monocytes Relative: 6 % (ref 3–12)
NEUTROS ABS: 5.6 10*3/uL (ref 1.7–7.7)
Neutrophils Relative %: 72 % (ref 43–77)
Platelets: 365 10*3/uL (ref 150–400)
RBC: 3.93 MIL/uL (ref 3.87–5.11)
RDW: 17.3 % — ABNORMAL HIGH (ref 11.5–15.5)
WBC: 7.7 10*3/uL (ref 4.0–10.5)

## 2014-07-09 LAB — COMPREHENSIVE METABOLIC PANEL
ALT: 13 U/L (ref 0–35)
ANION GAP: 7 (ref 5–15)
AST: 16 U/L (ref 0–37)
Albumin: 2.9 g/dL — ABNORMAL LOW (ref 3.5–5.2)
Alkaline Phosphatase: 89 U/L (ref 39–117)
BUN: 13 mg/dL (ref 6–23)
CHLORIDE: 107 meq/L (ref 96–112)
CO2: 23 mmol/L (ref 19–32)
Calcium: 7.2 mg/dL — ABNORMAL LOW (ref 8.4–10.5)
Creatinine, Ser: 0.86 mg/dL (ref 0.50–1.10)
GFR calc Af Amer: 82 mL/min — ABNORMAL LOW (ref >90)
GFR calc non Af Amer: 70 mL/min — ABNORMAL LOW (ref >90)
Glucose, Bld: 115 mg/dL — ABNORMAL HIGH (ref 70–99)
Potassium: 3.1 mmol/L — ABNORMAL LOW (ref 3.5–5.1)
SODIUM: 137 mmol/L (ref 135–145)
Total Bilirubin: 0.4 mg/dL (ref 0.3–1.2)
Total Protein: 7.2 g/dL (ref 6.0–8.3)

## 2014-07-09 MED ORDER — POTASSIUM CHLORIDE 10 MEQ/100ML IV SOLN
10.0000 meq | Freq: Once | INTRAVENOUS | Status: AC
  Administered 2014-07-09: 10 meq via INTRAVENOUS
  Filled 2014-07-09: qty 100

## 2014-07-09 MED ORDER — IOHEXOL 300 MG/ML  SOLN: 50.0000 mL | Freq: Once | INTRAMUSCULAR | Status: DC | PRN

## 2014-07-09 MED ORDER — HYDROMORPHONE HCL 1 MG/ML IJ SOLN
0.5000 mg | Freq: Once | INTRAMUSCULAR | Status: AC
  Administered 2014-07-09: 0.5 mg via INTRAVENOUS
  Filled 2014-07-09: qty 1

## 2014-07-09 MED ORDER — IOHEXOL 300 MG/ML  SOLN
100.0000 mL | Freq: Once | INTRAMUSCULAR | Status: AC | PRN
  Administered 2014-07-09: 100 mL via INTRAVENOUS

## 2014-07-09 MED ORDER — SODIUM CHLORIDE 0.9 % IV SOLN
Freq: Once | INTRAVENOUS | Status: AC
  Administered 2014-07-09: 17:00:00 via INTRAVENOUS

## 2014-07-09 MED ORDER — IOHEXOL 300 MG/ML  SOLN
50.0000 mL | Freq: Once | INTRAMUSCULAR | Status: AC | PRN
  Administered 2014-07-09: 50 mL via ORAL

## 2014-07-09 MED ORDER — SODIUM CHLORIDE 0.9 % IV BOLUS (SEPSIS)
1000.0000 mL | Freq: Once | INTRAVENOUS | Status: AC
  Administered 2014-07-09: 1000 mL via INTRAVENOUS

## 2014-07-09 NOTE — ED Provider Notes (Signed)
CSN: [id number]     Arrival date & time 07/09/14  1040 History  This chart was scribed for Maudry Diego, MD by Stephania Fragmin, ED Scribe. This patient was seen in room APA02/APA02 and the patient's care was started at 1:10 PM.    Chief Complaint  Patient presents with  . Diarrhea   Patient is a 64 y.o. female presenting with diarrhea. The history is provided by the patient and a relative. No language interpreter was used.  Diarrhea Quality:  Blood-tinged Severity:  Severe Onset quality:  Sudden Number of episodes:  ~30-40 Duration:  3 days Timing:  Constant Relieved by:  Nothing Worsened by:  Nothing tried Ineffective treatments:  None tried Associated symptoms: abdominal pain   Associated symptoms: no headaches   Risk factors comment:  Recent surgery    HPI Comments: [name] is a 64 y.o. female who presents to the Emergency Department complaining of nausea, diarrhea, and generalized weakness for the past 3 days. She reports 20 episodes of diarrhea 2 days ago, 9-12 episodes yesterday, and 4 times today, with a little blood. She also complains of associated periumbilical pain. Patient has a history of surgery for bowel obstruction and 2 fistulas done at [facility] about one month ago. Patient was hospitalized for a month. She is scheduled to return for checkup in 4 days.   Past Medical History  Diagnosis Date  . Bowel obstruction several    Recurrent SBO secondary to adhesions  . Hypothyroidism   . Acid reflux   . Chronic back pain   . Hyperlipidemia   . Hypertension     pt says taken off medication since has lost weight.  . Cancer 2011 [facility])    diagnosed in 2009 per pt. Invasive High grade Urothelial carcinoma- s/p radiation and Chemo   . UTI (lower urinary tract infection)     Recurrent  . Malnutrition   . Leukopenia 12/06/2011    HIV serology negative  . Fatty liver    Past Surgical History  Procedure Laterality Date  . Back surgery      X2  . Hernia repair       mesh  . Abdominal surgery      with intestinal "puncture" x 2, exploratory surgery   . Cholecystectomy    . Ileo conduit      For bladder cancer  . Cystectomy      breast  . Portacath placement    . Bladder removal     Family History  Problem Relation Age of Onset  . Heart disease Mother     enlarged  . Hypertension Mother   . Diabetes Father   . Diabetes Sister   . Diabetes Maternal Grandmother   . Colon cancer Neg Hx    History  Substance Use Topics  . Smoking status: Former Smoker    Quit date: 09/26/2010  . Smokeless tobacco: Never Used  . Alcohol Use: No   OB History    No data available     Review of Systems  Constitutional: Negative for appetite change and fatigue.  HENT: Negative for congestion, ear discharge and sinus pressure.   Eyes: Negative for discharge.  Respiratory: Negative for cough.   Cardiovascular: Negative for chest pain.  Gastrointestinal: Positive for nausea, abdominal pain and diarrhea.  Genitourinary: Negative for frequency and hematuria.  Musculoskeletal: Negative for back pain.  Skin: Negative for rash.  Neurological: Positive for weakness. Negative for seizures and headaches.  Psychiatric/Behavioral: Negative for  hallucinations.      Allergies  Bactrim; Cefixime; Codeine; Lactose; Nitrofuran derivatives; Penicillins; and Sulfa antibiotics  Home Medications   Prior to Admission medications   Medication Sig Start Date End Date Taking? Authorizing Provider  albuterol (PROVENTIL HFA;VENTOLIN HFA) 108 (90 BASE) MCG/ACT inhaler Inhale 2 puffs into the lungs every 6 (six) hours as needed for wheezing. 08/25/13   Alycia Rossetti, MD  apixaban Arne Cleveland) 5 MG TABS tablet Take by mouth. 06/17/14 08/22/14  Historical Provider, MD  DEXILANT 60 MG capsule TAKE ONE CAPSULE BY MOUTH EVERY DAY 02/23/14   Alycia Rossetti, MD  docusate sodium (COLACE) 100 MG capsule Take 100 mg by mouth 2 (two) times daily.     Historical Provider, MD   furosemide (LASIX) 40 MG tablet Take 1 tablet (40 mg total) by mouth 2 (two) times daily. 02/11/14   Alycia Rossetti, MD  HYDROmorphone (DILAUDID) 4 MG tablet Take 1 tablet (4 mg total) by mouth every 4 (four) hours as needed for severe pain. 07/05/14   Alycia Rossetti, MD  levothyroxine (SYNTHROID, LEVOTHROID) 50 MCG tablet TAKE 1 TABLET BY MOUTH EVERY DAY 06/06/14   Alycia Rossetti, MD  Multiple Vitamin (MULITIVITAMIN WITH MINERALS) TABS Take 1 tablet by mouth every other day. On Tuesdays, Thursdays, Saturdays, and Sundays. **Administered at bedtime with TPN administration    Historical Provider, MD  ondansetron (ZOFRAN ODT) 8 MG disintegrating tablet Take 1 tablet (8 mg total) by mouth every 8 (eight) hours as needed for nausea. 01/28/14   Alycia Rossetti, MD  polyethylene glycol powder (GLYCOLAX/MIRALAX) powder DISSOLVE 1 CAPFUL IN LIQUID AS DIRECTED AND TAKE BY MOUTH EVERY DAY 02/26/14   Alycia Rossetti, MD  potassium chloride SA (K-DUR,KLOR-CON) 20 MEQ tablet Take 1 tablet (20 mEq total) by mouth daily. 06/29/14   Alycia Rossetti, MD  pravastatin (PRAVACHOL) 20 MG tablet Take 1 tablet (20 mg total) by mouth daily. For cholesterol 04/13/14   Alycia Rossetti, MD  traZODone (DESYREL) 50 MG tablet TAKE 1 TABLET AT BEDTIME FOR SLEEP 06/06/14   Alycia Rossetti, MD   BP 123/73 mmHg  Pulse 92  Temp(Src) 98.7 F (37.1 C) (Core (Comment))  Resp 20  Ht 5\' 5"  (1.651 m)  Wt 136 lb (61.689 kg)  BMI 22.63 kg/m2  SpO2 100% Physical Exam  Constitutional: She is oriented to person, place, and time. She appears well-developed.  HENT:  Head: Normocephalic.  Eyes: Conjunctivae and EOM are normal. No scleral icterus.  Neck: Neck supple. No thyromegaly present.  Cardiovascular: Normal rate and regular rhythm.  Exam reveals no gallop and no friction rub.   No murmur heard. Pulmonary/Chest: No stridor. She has no wheezes. She has no rales. She exhibits no tenderness.  Abdominal: She exhibits no  distension. There is tenderness. There is no rebound.  Mild diffuse tenderness to palpation. Drain inferior to umbilicus.  Musculoskeletal: Normal range of motion. She exhibits no edema.  Lymphadenopathy:    She has no cervical adenopathy.  Neurological: She is oriented to person, place, and time. She exhibits normal muscle tone. Coordination normal.  Skin: No rash noted. No erythema.  Psychiatric: She has a normal mood and affect. Her behavior is normal.  Nursing note and vitals reviewed.   ED Course  Procedures (including critical care time)  DIAGNOSTIC STUDIES: Oxygen Saturation is 100% on room air, normal by my interpretation.    COORDINATION OF CARE: 1:13 PM - Discussed treatment plan with pt at bedside  which includes IV fluids and possible CT scan and pt agreed to plan.   Labs Review Labs Reviewed  CBC WITH DIFFERENTIAL - Abnormal; Notable for the following:    Hemoglobin 10.5 (*)    HCT 31.0 (*)    RDW 17.3 (*)    All other components within normal limits  COMPREHENSIVE METABOLIC PANEL - Abnormal; Notable for the following:    Potassium 3.1 (*)    Glucose, Bld 115 (*)    Calcium 7.2 (*)    Albumin 2.9 (*)    GFR calc non Af Amer 70 (*)    GFR calc Af Amer 82 (*)    All other components within normal limits    Imaging Review No results found.   EKG Interpretation None      MDM   Final diagnoses:  None   Diarrhea and dehydration with weakness,   S/p bowel obstruction surgery at [place].  Will transfer to dr. Mariah Milling at [facility]  The chart was scribed for me under my direct supervision.  I personally performed the history, physical, and medical decision making and all procedures in the evaluation of this patient.Maudry Diego, MD 07/09/14 [phone]

## 2014-07-09 NOTE — ED Notes (Signed)
PT c/o nausea, diarrhea and generalized weakness x3 days. PT had surgery for bowel obstruction approximately 3 weeks ago.

## 2014-07-09 NOTE — ED Notes (Signed)
Called [facility] to Transfer Pt to [facility].  Informed it will be after 7pm before they get here.  Nurse informed.

## 2014-07-09 NOTE — ED Notes (Signed)
Pt and family aware that we are awaiting bed assignment at [facility]. Once bed assignment is made, pt will be transported. Pt verbalized understanding.

## 2014-07-13 DIAGNOSIS — R269 Unspecified abnormalities of gait and mobility: Secondary | ICD-10-CM | POA: Diagnosis not present

## 2014-07-15 DIAGNOSIS — R269 Unspecified abnormalities of gait and mobility: Secondary | ICD-10-CM | POA: Diagnosis not present

## 2014-07-16 DIAGNOSIS — T8189XA Other complications of procedures, not elsewhere classified, initial encounter: Secondary | ICD-10-CM | POA: Diagnosis not present

## 2014-07-16 DIAGNOSIS — Z8551 Personal history of malignant neoplasm of bladder: Secondary | ICD-10-CM | POA: Diagnosis not present

## 2014-07-18 DIAGNOSIS — R269 Unspecified abnormalities of gait and mobility: Secondary | ICD-10-CM | POA: Diagnosis not present

## 2014-07-18 DIAGNOSIS — T8189XA Other complications of procedures, not elsewhere classified, initial encounter: Secondary | ICD-10-CM | POA: Diagnosis not present

## 2014-07-20 ENCOUNTER — Telehealth: Payer: Self-pay | Admitting: Family Medicine

## 2014-07-20 DIAGNOSIS — M6281 Muscle weakness (generalized): Secondary | ICD-10-CM | POA: Diagnosis not present

## 2014-07-20 DIAGNOSIS — R269 Unspecified abnormalities of gait and mobility: Secondary | ICD-10-CM | POA: Diagnosis not present

## 2014-07-20 NOTE — Telephone Encounter (Signed)
Call placed to [facility] to inquire if services have been resumed.   [place].

## 2014-07-20 NOTE — Telephone Encounter (Signed)
Received return call from [place].   Advised that patient is now being followed by visits office for wound vac.   Call placed to visits office and verbal orders given.   Printed order signed by PA and faxed to [facility].

## 2014-07-20 NOTE — Telephone Encounter (Signed)
YES

## 2014-07-20 NOTE — Telephone Encounter (Signed)
Pt thinks she has a UTI and has a nurse that comes to her house for dressing changes and would like to know if we can give her an order to have her urine tested?

## 2014-07-21 ENCOUNTER — Telehealth: Payer: Self-pay | Admitting: *Deleted

## 2014-07-21 ENCOUNTER — Telehealth: Payer: Self-pay | Admitting: Family Medicine

## 2014-07-21 MED ORDER — CIPROFLOXACIN HCL 500 MG PO TABS: 500.0000 mg | ORAL_TABLET | Freq: Two times a day (BID) | ORAL | Status: DC

## 2014-07-21 NOTE — Telephone Encounter (Signed)
Results received from [facility].   Urine Culture pending.   PA to review UA.

## 2014-07-21 NOTE — Telephone Encounter (Signed)
PA reviewed UA results and new orders obtained.   Cipro 500mg  PO BID x5 days.   Prescription sent to pharmacy.   Call placed to patient and patient made aware.

## 2014-07-21 NOTE — Telephone Encounter (Signed)
Received call from patient.   Reports that she was seen in ER on 07/09/2014 and then transferred to [facility] for diarrhea.   Requested to know if MD needed F.U visit with her since she has been seen 2x this month.   Advised MD out of office this week. Advised that ER F/U are usually recommended.   Please advise.

## 2014-07-21 NOTE — Telephone Encounter (Signed)
Patient is calling to see if urine culture results were in  [phone]

## 2014-07-25 DIAGNOSIS — R269 Unspecified abnormalities of gait and mobility: Secondary | ICD-10-CM | POA: Diagnosis not present

## 2014-07-25 NOTE — Telephone Encounter (Signed)
Call pt she was treated with antibiotics by our office. I recommend she call her urolgist about the continued blood in her bag, with her recent surgery, if she has any fever, severe pain she needs to go back to [facility] as there is nothing we can do in the office

## 2014-07-25 NOTE — Telephone Encounter (Signed)
Patient states that she is no longer having issues with diarrhea.   Reports that she was given Magnesium.   Also reports that she continues to have blood noted in urine.   Call placed to [facility] for culture results.

## 2014-07-25 NOTE — Telephone Encounter (Signed)
Culture results obtained and noted to be sensitive to Cipro.

## 2014-07-25 NOTE — Telephone Encounter (Addendum)
Call placed to patient and patient made aware.   States that she does have an appointment with urology next week.

## 2014-07-25 NOTE — Telephone Encounter (Signed)
If she is doing okay no follow-up needed Make sure they did not tell her she needed any kind of labs

## 2014-07-27 DIAGNOSIS — R269 Unspecified abnormalities of gait and mobility: Secondary | ICD-10-CM | POA: Diagnosis not present

## 2014-07-29 DIAGNOSIS — R269 Unspecified abnormalities of gait and mobility: Secondary | ICD-10-CM | POA: Diagnosis not present

## 2014-08-01 DIAGNOSIS — R269 Unspecified abnormalities of gait and mobility: Secondary | ICD-10-CM | POA: Diagnosis not present

## 2014-08-02 ENCOUNTER — Other Ambulatory Visit: Payer: Self-pay | Admitting: Family Medicine

## 2014-08-02 DIAGNOSIS — Z87440 Personal history of urinary (tract) infections: Secondary | ICD-10-CM | POA: Diagnosis not present

## 2014-08-02 DIAGNOSIS — R269 Unspecified abnormalities of gait and mobility: Secondary | ICD-10-CM | POA: Diagnosis not present

## 2014-08-02 DIAGNOSIS — L89223 Pressure ulcer of left hip, stage 3: Secondary | ICD-10-CM | POA: Diagnosis not present

## 2014-08-03 DIAGNOSIS — I10 Essential (primary) hypertension: Secondary | ICD-10-CM | POA: Diagnosis not present

## 2014-08-03 DIAGNOSIS — Z79899 Other long term (current) drug therapy: Secondary | ICD-10-CM | POA: Diagnosis not present

## 2014-08-03 DIAGNOSIS — E039 Hypothyroidism, unspecified: Secondary | ICD-10-CM | POA: Diagnosis not present

## 2014-08-03 DIAGNOSIS — Z9889 Other specified postprocedural states: Secondary | ICD-10-CM | POA: Diagnosis not present

## 2014-08-03 DIAGNOSIS — E785 Hyperlipidemia, unspecified: Secondary | ICD-10-CM | POA: Diagnosis not present

## 2014-08-03 DIAGNOSIS — K5669 Other intestinal obstruction: Secondary | ICD-10-CM | POA: Diagnosis not present

## 2014-08-03 DIAGNOSIS — T8189XD Other complications of procedures, not elsewhere classified, subsequent encounter: Secondary | ICD-10-CM | POA: Diagnosis not present

## 2014-08-03 DIAGNOSIS — R269 Unspecified abnormalities of gait and mobility: Secondary | ICD-10-CM | POA: Diagnosis not present

## 2014-08-03 DIAGNOSIS — Z87891 Personal history of nicotine dependence: Secondary | ICD-10-CM | POA: Diagnosis not present

## 2014-08-03 DIAGNOSIS — C679 Malignant neoplasm of bladder, unspecified: Secondary | ICD-10-CM | POA: Diagnosis not present

## 2014-08-03 DIAGNOSIS — K219 Gastro-esophageal reflux disease without esophagitis: Secondary | ICD-10-CM | POA: Diagnosis not present

## 2014-08-03 NOTE — Telephone Encounter (Signed)
Refill appropriate and filled per protocol. 

## 2014-08-05 DIAGNOSIS — R269 Unspecified abnormalities of gait and mobility: Secondary | ICD-10-CM | POA: Diagnosis not present

## 2014-08-08 DIAGNOSIS — R269 Unspecified abnormalities of gait and mobility: Secondary | ICD-10-CM | POA: Diagnosis not present

## 2014-08-09 ENCOUNTER — Telehealth: Payer: Self-pay | Admitting: *Deleted

## 2014-08-09 DIAGNOSIS — T8189XA Other complications of procedures, not elsewhere classified, initial encounter: Secondary | ICD-10-CM | POA: Diagnosis not present

## 2014-08-09 NOTE — Telephone Encounter (Signed)
Stop eliquis 3 days before procedure, wait 48 hours then restart

## 2014-08-09 NOTE — Telephone Encounter (Signed)
Received call from patient.   Reports that she is to have colonoscopy at [facility] next week.  States that she was advised to ask PCP if she should stop Eliquis, and if so, for how long.   MD please advise.

## 2014-08-09 NOTE — Telephone Encounter (Signed)
Call placed to patient and patient made aware.  

## 2014-08-10 DIAGNOSIS — R269 Unspecified abnormalities of gait and mobility: Secondary | ICD-10-CM | POA: Diagnosis not present

## 2014-08-12 DIAGNOSIS — R269 Unspecified abnormalities of gait and mobility: Secondary | ICD-10-CM | POA: Diagnosis not present

## 2014-08-16 DIAGNOSIS — M6281 Muscle weakness (generalized): Secondary | ICD-10-CM | POA: Diagnosis not present

## 2014-08-17 DIAGNOSIS — M6281 Muscle weakness (generalized): Secondary | ICD-10-CM | POA: Diagnosis not present

## 2014-08-18 ENCOUNTER — Telehealth: Payer: Self-pay | Admitting: *Deleted

## 2014-08-18 DIAGNOSIS — T8189XA Other complications of procedures, not elsewhere classified, initial encounter: Secondary | ICD-10-CM | POA: Diagnosis not present

## 2014-08-18 NOTE — Telephone Encounter (Signed)
Call received from patient.   Reports that pharmacy states that order for ostomy supplies has not been received at this time.   Call placed to [name] and was advised that VO was not acceptable for DME supplies.   Attempted to re-fax new order multiple times with failed transmissions.   Glass blower/designer made aware and physically took prescription to [facility] in [place], [place].   Call placed to patient and patient made aware.

## 2014-08-19 DIAGNOSIS — Z936 Other artificial openings of urinary tract status: Secondary | ICD-10-CM | POA: Diagnosis not present

## 2014-08-19 DIAGNOSIS — C679 Malignant neoplasm of bladder, unspecified: Secondary | ICD-10-CM | POA: Diagnosis not present

## 2014-08-22 ENCOUNTER — Telehealth: Payer: Self-pay | Admitting: *Deleted

## 2014-08-22 DIAGNOSIS — M6281 Muscle weakness (generalized): Secondary | ICD-10-CM | POA: Diagnosis not present

## 2014-08-22 NOTE — Telephone Encounter (Signed)
Received call from [place], [facility] with [facility].   Reports that patient states that she is having multiple loose stools during the day. Reports that stools are looser than that of her norm.   Maudie Mercury states that she appears to be well hydrated, and no imbalance noted.   Advised to attempt blander diet. Requested to know if MD would advise OTC diarrhea medication.   MD made aware and advised to go to ER immediately if abd pain noted.   Schedule OV on 08/23/2014 d/t patient frequent obstructions.   Appointment scheduled.

## 2014-08-23 ENCOUNTER — Encounter: Payer: Self-pay | Admitting: Family Medicine

## 2014-08-23 ENCOUNTER — Ambulatory Visit (INDEPENDENT_AMBULATORY_CARE_PROVIDER_SITE_OTHER): Payer: 59 | Admitting: Family Medicine

## 2014-08-23 VITALS — BP 124/70 | HR 76 | Temp 97.8°F | Resp 14 | Ht 65.0 in | Wt 138.0 lb

## 2014-08-23 DIAGNOSIS — R197 Diarrhea, unspecified: Secondary | ICD-10-CM | POA: Diagnosis not present

## 2014-08-23 DIAGNOSIS — K5669 Other intestinal obstruction: Secondary | ICD-10-CM

## 2014-08-23 DIAGNOSIS — R790 Abnormal level of blood mineral: Secondary | ICD-10-CM

## 2014-08-23 DIAGNOSIS — K56600 Partial intestinal obstruction, unspecified as to cause: Secondary | ICD-10-CM

## 2014-08-23 LAB — COMPREHENSIVE METABOLIC PANEL
ALK PHOS: 91 U/L (ref 39–117)
ALT: 38 U/L — AB (ref 0–35)
AST: 24 U/L (ref 0–37)
Albumin: 3.1 g/dL — ABNORMAL LOW (ref 3.5–5.2)
BILIRUBIN TOTAL: 0.4 mg/dL (ref 0.2–1.2)
BUN: 12 mg/dL (ref 6–23)
CHLORIDE: 107 meq/L (ref 96–112)
CO2: 19 mEq/L (ref 19–32)
Calcium: 6.3 mg/dL — CL (ref 8.4–10.5)
Creat: 1.36 mg/dL — ABNORMAL HIGH (ref 0.50–1.10)
Glucose, Bld: 86 mg/dL (ref 70–99)
Potassium: 3.5 mEq/L (ref 3.5–5.3)
Sodium: 140 mEq/L (ref 135–145)
Total Protein: 7.1 g/dL (ref 6.0–8.3)

## 2014-08-23 LAB — CBC W/MCH & 3 PART DIFF
HCT: 27.2 % — ABNORMAL LOW (ref 36.0–46.0)
Hemoglobin: 9.2 g/dL — ABNORMAL LOW (ref 12.0–15.0)
LYMPHS PCT: 23 % (ref 12–46)
Lymphs Abs: 1.4 10*3/uL (ref 0.7–4.0)
MCH: 25.4 pg — ABNORMAL LOW (ref 26.0–34.0)
MCHC: 33.8 g/dL (ref 30.0–36.0)
MCV: 75.1 fL — AB (ref 78.0–100.0)
Neutro Abs: 4.3 10*3/uL (ref 1.7–7.7)
Neutrophils Relative %: 70 % (ref 43–77)
Platelets: 401 10*3/uL — ABNORMAL HIGH (ref 150–400)
RBC: 3.62 MIL/uL — AB (ref 3.87–5.11)
RDW: 19.5 % — AB (ref 11.5–15.5)
WBC mixed population: 0.4 10*3/uL (ref 0.1–1.8)
WBC: 6.1 10*3/uL (ref 4.0–10.5)
WBCMIXPER: 7 % (ref 3–18)

## 2014-08-23 LAB — MAGNESIUM: MAGNESIUM: 0.5 mg/dL — AB (ref 1.5–2.5)

## 2014-08-23 MED ORDER — HYDROMORPHONE HCL 4 MG PO TABS: 4.0000 mg | ORAL_TABLET | ORAL | Status: DC | PRN

## 2014-08-23 MED ORDER — HYDROCORTISONE ACETATE 25 MG RE SUPP: 25.0000 mg | Freq: Two times a day (BID) | RECTAL | Status: DC

## 2014-08-23 NOTE — Assessment & Plan Note (Addendum)
Chronic diarrhea which she has had on and off for many years however it is worsening over the past 2 months. I will check stool culture and C. difficile but I don't see any other signs of her infectious colitis no fever no elevated white blood cell. She is taking laxatives and advised her to decrease this to just 3 times a week she can continue her stool softener. I think it has a lot to do with her recurrent partial small bowel obstructions and her by mouth intake. She is to decrease to soft foods only and to keep up with her fluids. I will check her magnesium level as well as she was noted to be hypo-magnesium when she has increased bowel movements. I called her gastroenterologist that she is a very complex patient relates they can get her scheduled for his March 17 she is on a cancellation list at this time. I would not recommend any Imodium or Lomotil due to her history with obstructions and bowel problems

## 2014-08-23 NOTE — Progress Notes (Signed)
Patient ID: [name], female   DOB: June 16, 1951, 64 y.o.   MRN: [id number]   Subjective:    Patient ID: [name], female    DOB: [birth year], 64 y.o.   MRN: [id number]  Patient presents for Loose Stools and Pain   Pt here with diarrhea up to 10-15x a day for the past 8 weeks. Very complex medical history with Urostomy from bladder cancer, multiple SBO, recent fistula from bowels to urostomy that was operated on in Dec 2015. She also had a procedure for widen the colon/ strictures and was scheduled for repeat procedure last week, but she missed appt due to illness.  She admits to abdominal pain/cramping along with change in stools, last antibiotics was about 1 month ago.  She was advised she could eat but has had multiple meats, pork beef, veggies that often cause pain/gassiness. Note she will continue to eat these things despite pain. She tried drinking ensure but this also caused upset stomach.   She does take daily Miralax as a laxative and stool softeners, she feels without them she can not have a BM. Note she has also had bleeding and pain from hemorroids past few weeks- she has tried tucks pads, coconut   Review Of Systems:  GEN- denies fatigue, fever, weight loss,weakness, recent illness HEENT- denies eye drainage, change in vision, nasal discharge, CVS- denies chest pain, palpitations RESP- denies SOB, cough, wheeze ABD- denies N/V, +change in stools, +abd pain GU- denies dysuria, hematuria, dribbling, incontinence MSK- denies joint pain, muscle aches, injury Neuro- denies headache, dizziness, syncope, seizure activity       Objective:    BP 124/70 mmHg  Pulse 76  Temp(Src) 97.8 F (36.6 C) (Oral)  Resp 14  Ht 5\' 5"  (1.651 m)  Wt 138 lb (62.596 kg)  BMI 22.96 kg/m2 GEN- NAD, alert and oriented x3, weight down 2lbs HEENT- PERRL, EOMI, non injected sclera, pink conjunctiva, MMM, oropharynx clear CVS- RRR, no murmur RESP-CTAB ABD-Hypactive BS, mild diffuse  TTP, ostomy bag in tact, urine clear, no rebound tenderness EXT- No edema Pulses- Radial 2+   WBC 6.1  Hb 9.2      Assessment & Plan:      Problem List Items Addressed This Visit      Unprioritized   Diarrhea   Relevant Orders   CBC w/MCH & 3 Part Diff   Comprehensive metabolic panel   Magnesium   Clostridium Difficile by PCR   Stool culture    Other Visit Diagnoses    Low magnesium levels    -  Primary    Relevant Orders    Magnesium       Note: This dictation was prepared with Dragon dictation along with smaller phrase technology. Any transcriptional errors that result from this process are unintentional.

## 2014-08-23 NOTE — Assessment & Plan Note (Addendum)
She was supposed to have some type of dilatation to the bowels but she missed this. I will defer to her gastroenterologist someone to be scheduled this after she is seen in their office. She is still moving gas therefore I do not think she is completely obstructed but she tends to start out this way before she is Dilaudid refilled

## 2014-08-23 NOTE — Patient Instructions (Addendum)
Due the miralax three times a week only Pain meds refilled No beef, pork, meats,soft foods only, fluids Suppository for the hemorrhoids Appt- [facility] --

## 2014-08-24 ENCOUNTER — Telehealth: Payer: Self-pay | Admitting: Family Medicine

## 2014-08-24 ENCOUNTER — Encounter (HOSPITAL_COMMUNITY): Payer: Self-pay | Admitting: *Deleted

## 2014-08-24 ENCOUNTER — Emergency Department (HOSPITAL_COMMUNITY)
Admission: EM | Admit: 2014-08-24 | Discharge: 2014-08-24 | Disposition: A | Discharge status: Home / Self Care | Payer: Medicare Other | Attending: Emergency Medicine | Admitting: Emergency Medicine

## 2014-08-24 DIAGNOSIS — E860 Dehydration: Secondary | ICD-10-CM | POA: Diagnosis not present

## 2014-08-24 DIAGNOSIS — Z87440 Personal history of urinary (tract) infections: Secondary | ICD-10-CM | POA: Insufficient documentation

## 2014-08-24 DIAGNOSIS — Z8719 Personal history of other diseases of the digestive system: Secondary | ICD-10-CM | POA: Diagnosis not present

## 2014-08-24 DIAGNOSIS — Z7952 Long term (current) use of systemic steroids: Secondary | ICD-10-CM | POA: Diagnosis not present

## 2014-08-24 DIAGNOSIS — Z7901 Long term (current) use of anticoagulants: Secondary | ICD-10-CM | POA: Insufficient documentation

## 2014-08-24 DIAGNOSIS — Z85528 Personal history of other malignant neoplasm of kidney: Secondary | ICD-10-CM | POA: Diagnosis not present

## 2014-08-24 DIAGNOSIS — Z862 Personal history of diseases of the blood and blood-forming organs and certain disorders involving the immune mechanism: Secondary | ICD-10-CM | POA: Diagnosis not present

## 2014-08-24 DIAGNOSIS — E039 Hypothyroidism, unspecified: Secondary | ICD-10-CM | POA: Insufficient documentation

## 2014-08-24 DIAGNOSIS — Z880 Allergy status to penicillin: Secondary | ICD-10-CM | POA: Diagnosis not present

## 2014-08-24 DIAGNOSIS — I10 Essential (primary) hypertension: Secondary | ICD-10-CM | POA: Insufficient documentation

## 2014-08-24 DIAGNOSIS — Z87891 Personal history of nicotine dependence: Secondary | ICD-10-CM | POA: Insufficient documentation

## 2014-08-24 DIAGNOSIS — G8929 Other chronic pain: Secondary | ICD-10-CM | POA: Diagnosis not present

## 2014-08-24 DIAGNOSIS — E785 Hyperlipidemia, unspecified: Secondary | ICD-10-CM | POA: Diagnosis not present

## 2014-08-24 DIAGNOSIS — Z79899 Other long term (current) drug therapy: Secondary | ICD-10-CM | POA: Diagnosis not present

## 2014-08-24 MED ORDER — SODIUM CHLORIDE 0.9 % IV BOLUS (SEPSIS)
500.0000 mL | Freq: Once | INTRAVENOUS | Status: AC
  Administered 2014-08-24: 500 mL via INTRAVENOUS

## 2014-08-24 MED ORDER — MAGNESIUM OXIDE -MG SUPPLEMENT 400 (240 MG) MG PO TABS: 1.0000 | ORAL_TABLET | Freq: Two times a day (BID) | ORAL | Status: DC

## 2014-08-24 MED ORDER — MAGNESIUM SULFATE 2 GM/50ML IV SOLN
2.0000 g | Freq: Once | INTRAVENOUS | Status: AC
  Administered 2014-08-24: 2 g via INTRAVENOUS
  Filled 2014-08-24: qty 50

## 2014-08-24 MED ORDER — SODIUM CHLORIDE 0.9 % IV SOLN
INTRAVENOUS | Status: DC
  Administered 2014-08-24: 15:00:00 via INTRAVENOUS

## 2014-08-24 NOTE — ED Provider Notes (Signed)
CSN: [id number]     Arrival date & time 08/24/14  1338 History  This chart was scribed for Richarda Blade, MD by Rayfield Citizen, ED Scribe. This patient was seen in room APA06/APA06 and the patient's care was started at 2:12 PM.    Chief Complaint  Patient presents with  . Dehydration   The history is provided by the patient. No language interpreter was used.     HPI Comments: [name] is a 64 y.o. female with past medical history of recurrent small bowel obstruction, chronic back pain, HLD, HTN who presents to the Emergency Department complaining of reported dehydration and low magnesium level. Patient reports she has recently had upwards of 10-15 bowel movements per day; she was seen by her PCP yesterday for this issue. Blood work done at that office visit indicted dehydration - patient states she was sent here for fluids. She regularly takes a Miralax 1x per night and a stool softener 2x per day; her doctor suggested she cut back on stool softener use.   She was last seen by her bowel surgeon at [facility] on 2/12 and all reported normal; next appointment in April.   Past Medical History  Diagnosis Date  . Bowel obstruction several    Recurrent SBO secondary to adhesions  . Hypothyroidism   . Acid reflux   . Chronic back pain   . Hyperlipidemia   . Hypertension     pt says taken off medication since has lost weight.  . Cancer 2011 [facility])    diagnosed in 2009 per pt. Invasive High grade Urothelial carcinoma- s/p radiation and Chemo   . UTI (lower urinary tract infection)     Recurrent  . Malnutrition   . Leukopenia 12/06/2011    HIV serology negative  . Fatty liver    Past Surgical History  Procedure Laterality Date  . Back surgery      X2  . Hernia repair      mesh  . Abdominal surgery      with intestinal "puncture" x 2, exploratory surgery   . Cholecystectomy    . Ileo conduit      For bladder cancer  . Cystectomy      breast  . Portacath placement    .  Bladder removal     Family History  Problem Relation Age of Onset  . Heart disease Mother     enlarged  . Hypertension Mother   . Diabetes Father   . Diabetes Sister   . Diabetes Maternal Grandmother   . Colon cancer Neg Hx    History  Substance Use Topics  . Smoking status: Former Smoker    Quit date: 09/26/2010  . Smokeless tobacco: Never Used  . Alcohol Use: No   OB History    No data available     Review of Systems  All other systems reviewed and are negative.  Allergies  Bactrim; Cefixime; Codeine; Lactose; Nitrofuran derivatives; Penicillins; and Sulfa antibiotics  Home Medications   Prior to Admission medications   Medication Sig Start Date End Date Taking? Authorizing Provider  apixaban (ELIQUIS) 5 MG TABS tablet Take 5 mg by mouth 2 (two) times daily.   Yes Historical Provider, MD  aspirin EC 325 MG tablet Take 325 mg by mouth daily.   Yes Historical Provider, MD  DEXILANT 60 MG capsule TAKE ONE CAPSULE BY MOUTH EVERY DAY 02/23/14  Yes Alycia Rossetti, MD  docusate sodium (COLACE) 100 MG capsule Take  100 mg by mouth 2 (two) times daily.    Yes Historical Provider, MD  HYDROmorphone (DILAUDID) 2 MG tablet Take 2-4 mg by mouth every 4 (four) hours as needed (pain).   Yes Historical Provider, MD  levothyroxine (SYNTHROID, LEVOTHROID) 50 MCG tablet TAKE 1 TABLET BY MOUTH EVERY DAY 06/06/14  Yes Alycia Rossetti, MD  ondansetron (ZOFRAN ODT) 8 MG disintegrating tablet Take 1 tablet (8 mg total) by mouth every 8 (eight) hours as needed for nausea. 01/28/14  Yes Alycia Rossetti, MD  polyethylene glycol powder (GLYCOLAX/MIRALAX) powder DISSOLVE 1 CAPFUL IN LIQUID AS DIRECTED AND TAKE BY MOUTH EVERY DAY 08/03/14  Yes Alycia Rossetti, MD  pravastatin (PRAVACHOL) 20 MG tablet Take 1 tablet (20 mg total) by mouth daily. For cholesterol 04/13/14  Yes Alycia Rossetti, MD  traZODone (DESYREL) 50 MG tablet TAKE 1 TABLET AT BEDTIME FOR SLEEP 06/06/14  Yes Alycia Rossetti, MD   furosemide (LASIX) 40 MG tablet Take 1 tablet (40 mg total) by mouth 2 (two) times daily. Patient not taking: Reported on 08/24/2014 02/11/14   Alycia Rossetti, MD  hydrocortisone (ANUSOL-HC) 25 MG suppository Place 1 suppository (25 mg total) rectally 2 (two) times daily. Patient not taking: Reported on 08/24/2014 08/23/14   Alycia Rossetti, MD  HYDROmorphone (DILAUDID) 4 MG tablet Take 1 tablet (4 mg total) by mouth every 4 (four) hours as needed for severe pain. Patient not taking: Reported on 08/24/2014 08/23/14   Alycia Rossetti, MD  Magnesium Oxide 400 (240 MG) MG TABS Take 1 tablet by mouth 2 (two) times daily. 08/24/14   Richarda Blade, MD  potassium chloride SA (K-DUR,KLOR-CON) 20 MEQ tablet Take 1 tablet (20 mEq total) by mouth daily. Patient not taking: Reported on 08/24/2014 06/29/14   Alycia Rossetti, MD   BP 119/83 mmHg  Pulse 85  Temp(Src) 97.8 F (36.6 C) (Oral)  Resp 18  Ht 5' 5.5" (1.664 m)  Wt 136 lb (61.689 kg)  BMI 22.28 kg/m2  SpO2 100% Physical Exam  Constitutional: She is oriented to person, place, and time. She appears well-developed and well-nourished.  HENT:  Head: Normocephalic and atraumatic.  Eyes: Conjunctivae and EOM are normal. Pupils are equal, round, and reactive to light.  Neck: Normal range of motion and phonation normal. Neck supple.  Cardiovascular: Normal rate and regular rhythm.   Pulmonary/Chest: Effort normal and breath sounds normal. She exhibits no tenderness.  Abdominal: Soft. She exhibits no distension. There is no tenderness. There is no guarding.  Musculoskeletal: Normal range of motion.  Neurological: She is alert and oriented to person, place, and time. She exhibits normal muscle tone.  Skin: Skin is warm and dry.  Psychiatric: She has a normal mood and affect. Her behavior is normal. Judgment and thought content normal.  Nursing note and vitals reviewed.   ED Course  Procedures   DIAGNOSTIC STUDIES: Oxygen Saturation is 100% on  RA, normal by my interpretation.    COORDINATION OF CARE:  Medications  0.9 %  sodium chloride infusion ( Intravenous New Bag/Given 08/24/14 1450)  magnesium sulfate IVPB 2 g 50 mL (0 g Intravenous Stopped 08/24/14 1538)  sodium chloride 0.9 % bolus 500 mL (0 mLs Intravenous Stopped 08/24/14 1450)    Patient Vitals for the past 24 hrs:  BP Temp Temp src Pulse Resp SpO2 Height Weight  08/24/14 1342 119/83 mmHg 97.8 F (36.6 C) Oral 85 18 100 % 5' 5.5" (1.664 m) 136 lb (61.689  kg)   2:17 PM Discussed treatment plan with pt at bedside and pt agreed to plan.  4:23 PM Reevaluation with update and discussion. After initial assessment and treatment, an updated evaluation reveals no c/o. She is comfortable. Findings discussed with patient, all questions answered. [place] Review Labs Reviewed - No data to display  Imaging Review No results found.   EKG Interpretation  Date/Time:    Ventricular Rate:    PR Interval:    QRS Duration:   QT Interval:    QTC Calculation:   R Axis:     Text Interpretation:         MDM   Final diagnoses:  Hypomagnesemia  Dehydration   Incidental hypomagnesemia discovered during evaluation for diarrhea.  Patient's diarrhea has been addressed by telling her to use less stool softener.  She's been treated with IV magnesium here, tolerating it well.  There is no indication for further evaluation and treatment at this time.  Nursing Notes Reviewed/ Care Coordinated Applicable Imaging Reviewed Interpretation of Laboratory Data incorporated into ED treatment  The patient appears reasonably screened and/or stabilized for discharge and I doubt any other medical condition or other [facility] requiring further screening, evaluation, or treatment in the ED at this time prior to discharge.  Plan: Home Medications- Mg Oxide BID; Home Treatments- rest, fluids; return here if the recommended treatment, does not improve the symptoms; Recommended follow  up- PCP 1 week  I personally performed the services described in this documentation, which was scribed in my presence. The recorded information has been reviewed and is accurate.       Richarda Blade, MD 08/24/14 [phone]

## 2014-08-24 NOTE — ED Notes (Signed)
Dr. Buelah Manis sent pt here for dehydration and low magnesium level

## 2014-08-24 NOTE — Telephone Encounter (Signed)
Patient calling to speak with you regarding her conversation with dr Buelah Manis last night regarding the hospital, and also some suppositories that had been discussed as well  [phone]

## 2014-08-24 NOTE — Telephone Encounter (Signed)
Spoke with patient.   Advised that MD wants patient to go to ER for IV bolus for dehydration and IV Mag for low serum magnesium.   Call has been placed to [facility] ER to make aware.   Patient also states that MD was to order suppositories for diarrhea. Advised that Anusol suppositories have been sent to pharmacy, but they are for hemorrhoids.   Verbalized understanding.

## 2014-08-24 NOTE — Discharge Instructions (Signed)
Dehydration, Adult °Dehydration is when you lose more fluids from the body than you take in. Vital organs like the kidneys, brain, and heart cannot function without a proper amount of fluids and salt. Any loss of fluids from the body can cause dehydration.  °CAUSES  °· Vomiting. °· Diarrhea. °· Excessive sweating. °· Excessive urine output. °· Fever. °SYMPTOMS  °Mild dehydration °· Thirst. °· Dry lips. °· Slightly dry mouth. °Moderate dehydration °· Very dry mouth. °· Sunken eyes. °· Skin does not bounce back quickly when lightly pinched and released. °· Dark urine and decreased urine production. °· Decreased tear production. °· Headache. °Severe dehydration °· Very dry mouth. °· Extreme thirst. °· Rapid, weak pulse (more than 100 beats per minute at rest). °· Cold hands and feet. °· Not able to sweat in spite of heat and temperature. °· Rapid breathing. °· Blue lips. °· Confusion and lethargy. °· Difficulty being awakened. °· Minimal urine production. °· No tears. °DIAGNOSIS  °Your caregiver will diagnose dehydration based on your symptoms and your exam. Blood and urine tests will help confirm the diagnosis. The diagnostic evaluation should also identify the cause of dehydration. °TREATMENT  °Treatment of mild or moderate dehydration can often be done at home by increasing the amount of fluids that you drink. It is best to drink small amounts of fluid more often. Drinking too much at one time can make vomiting worse. Refer to the home care instructions below. °Severe dehydration needs to be treated at the hospital where you will probably be given intravenous (IV) fluids that contain water and electrolytes. °HOME CARE INSTRUCTIONS  °· Ask your caregiver about specific rehydration instructions. °· Drink enough fluids to keep your urine clear or pale yellow. °· Drink small amounts frequently if you have nausea and vomiting. °· Eat as you normally do. °· Avoid: °¨ Foods or drinks high in sugar. °¨ Carbonated  drinks. °¨ Juice. °¨ Extremely hot or cold fluids. °¨ Drinks with caffeine. °¨ Fatty, greasy foods. °¨ Alcohol. °¨ Tobacco. °¨ Overeating. °¨ Gelatin desserts. °· Wash your hands well to avoid spreading bacteria and viruses. °· Only take over-the-counter or prescription medicines for pain, discomfort, or fever as directed by your caregiver. °· Ask your caregiver if you should continue all prescribed and over-the-counter medicines. °· Keep all follow-up appointments with your caregiver. °SEEK MEDICAL CARE IF: °· You have abdominal pain and it increases or stays in one area (localizes). °· You have a rash, stiff neck, or severe headache. °· You are irritable, sleepy, or difficult to awaken. °· You are weak, dizzy, or extremely thirsty. °SEEK IMMEDIATE MEDICAL CARE IF:  °· You are unable to keep fluids down or you get worse despite treatment. °· You have frequent episodes of vomiting or diarrhea. °· You have blood or green matter (bile) in your vomit. °· You have blood in your stool or your stool looks black and tarry. °· You have not urinated in 6 to 8 hours, or you have only urinated a small amount of very dark urine. °· You have a fever. °· You faint. °MAKE SURE YOU:  °· Understand these instructions. °· Will watch your condition. °· Will get help right away if you are not doing well or get worse. °Document Released: 06/17/2005 Document Revised: 09/09/2011 Document Reviewed: 02/04/2011 °ExitCare® Patient Information ©2015 [employer]. This information is not intended to replace advice given to you by your health care provider. Make sure you discuss any questions you have with your health care   provider.  Hypomagnesemia Magnesium is a common ion (mineral) in the body which is needed for metabolism. It is about how the body handles food and other chemical reactions necessary for life. Only about 2% of the magnesium in our body is found in the blood. When this is low, it is called hypomagnesemia. The blood will  measure only a tiny amount of the magnesium in our body. When it is low in our blood, it does not mean that the whole body supply is low. The normal serum concentration ranges from 1.8-2.5 mEq/L. When the level gets to be less than 1.0 mEq/L, a number of problems begin to happen.  CAUSES   Receiving intravenous fluids without magnesium replacement.  Loss of magnesium from the bowel by nasogastric suction.  Loss of magnesium from nausea and vomiting or severe diarrhea. Any of the inflammatory bowel conditions can cause this.  Abuse of alcohol often leads to low serum magnesium.  An inherited form of magnesium loss happens when the kidneys lose magnesium. This is called familial or primary hypomagnesemia.  Some medications such as diuretics also cause the loss of magnesium. SYMPTOMS  These following problems are worse if the changes in magnesium levels come on suddenly.  Tremor.  Confusion.  Muscle weakness.  Oversensitive to sights and sounds.  Sensitive reflexes.  Depression.  Muscular fibrillations.  Overreactivity of the nerves.  Irritability.  Psychosis.  Spasms of the hand muscles.  Tetany (where the muscles go into uncontrollable spasms). DIAGNOSIS  This condition can be diagnosed by blood tests. TREATMENT   In an emergency, magnesium can be given intravenously (by vein).  If the condition is less worrisome, it can be corrected by diet. High levels of magnesium are found in green leafy vegetables, peas, beans, and nuts among other things. It can also be given through medications by mouth.  If it is being caused by medications, changes can be made.  If alcohol is a problem, help is available if there are difficulties giving it up. Document Released: 03/13/2005 Document Revised: 11/01/2013 Document Reviewed: 02/05/2008 [facility] Patient Information 2015 [place], [place]. This information is not intended to replace advice given to you by your health care provider.  Make sure you discuss any questions you have with your health care provider.

## 2014-08-25 ENCOUNTER — Other Ambulatory Visit: Payer: Self-pay | Admitting: *Deleted

## 2014-08-25 DIAGNOSIS — E860 Dehydration: Secondary | ICD-10-CM

## 2014-08-26 ENCOUNTER — Telehealth: Payer: Self-pay | Admitting: Family Medicine

## 2014-08-26 ENCOUNTER — Other Ambulatory Visit: Payer: 59

## 2014-08-26 DIAGNOSIS — R197 Diarrhea, unspecified: Secondary | ICD-10-CM

## 2014-08-26 MED ORDER — LEVOTHYROXINE SODIUM 50 MCG PO TABS: 50.0000 ug | ORAL_TABLET | Freq: Every day | ORAL | Status: DC

## 2014-08-26 NOTE — Telephone Encounter (Signed)
[phone]  PT is needing a refill on her thyroid medication [facility]

## 2014-08-26 NOTE — Telephone Encounter (Signed)
Prescription sent to pharmacy.

## 2014-08-29 ENCOUNTER — Telehealth: Payer: Self-pay | Admitting: Family Medicine

## 2014-08-29 DIAGNOSIS — M6281 Muscle weakness (generalized): Secondary | ICD-10-CM | POA: Diagnosis not present

## 2014-08-29 MED ORDER — LEVOTHYROXINE SODIUM 75 MCG PO TABS: 75.0000 ug | ORAL_TABLET | Freq: Every day | ORAL | Status: DC

## 2014-08-29 NOTE — Telephone Encounter (Signed)
Call placed to patient and patient made aware.   Prescription corrected with pharmacy.   Advised to take 1.5tabs until she runs out of medications, and then pick up new dosage.

## 2014-08-29 NOTE — Telephone Encounter (Signed)
Patient calling about picking up an rx at the pharmacy and the dosage being different that what dr Buelah Manis prescribed, would like a call back regarding this  [phone]

## 2014-08-29 NOTE — Telephone Encounter (Signed)
She has been on this dose since August 2015

## 2014-08-29 NOTE — Telephone Encounter (Signed)
Call paced to patient.   States that she was taking Synthroid 62mcg, but when she picked up new prescription, it was for 47mcg.   MD please advise.

## 2014-08-30 LAB — STOOL CULTURE

## 2014-09-01 ENCOUNTER — Other Ambulatory Visit: Payer: 59

## 2014-09-01 DIAGNOSIS — E860 Dehydration: Secondary | ICD-10-CM | POA: Diagnosis not present

## 2014-09-02 ENCOUNTER — Other Ambulatory Visit: Payer: Self-pay | Admitting: *Deleted

## 2014-09-02 DIAGNOSIS — E876 Hypokalemia: Secondary | ICD-10-CM

## 2014-09-02 LAB — COMPLETE METABOLIC PANEL WITH GFR
ALK PHOS: 85 U/L (ref 39–117)
ALT: 23 U/L (ref 0–35)
AST: 20 U/L (ref 0–37)
Albumin: 3.2 g/dL — ABNORMAL LOW (ref 3.5–5.2)
BILIRUBIN TOTAL: 0.4 mg/dL (ref 0.2–1.2)
BUN: 18 mg/dL (ref 6–23)
CO2: 24 mEq/L (ref 19–32)
Calcium: 7.1 mg/dL — ABNORMAL LOW (ref 8.4–10.5)
Chloride: 107 mEq/L (ref 96–112)
Creat: 1.23 mg/dL — ABNORMAL HIGH (ref 0.50–1.10)
GFR, Est African American: 54 mL/min — ABNORMAL LOW
GFR, Est Non African American: 47 mL/min — ABNORMAL LOW
Glucose, Bld: 91 mg/dL (ref 70–99)
Potassium: 3.4 mEq/L — ABNORMAL LOW (ref 3.5–5.3)
SODIUM: 139 meq/L (ref 135–145)
Total Protein: 6.8 g/dL (ref 6.0–8.3)

## 2014-09-02 LAB — MAGNESIUM: Magnesium: 0.7 mg/dL — ABNORMAL LOW (ref 1.5–2.5)

## 2014-09-02 MED ORDER — POTASSIUM CHLORIDE CRYS ER 20 MEQ PO TBCR: 20.0000 meq | EXTENDED_RELEASE_TABLET | Freq: Every day | ORAL | Status: DC

## 2014-09-02 MED ORDER — MAGNESIUM OXIDE -MG SUPPLEMENT 400 (240 MG) MG PO TABS: 2.0000 | ORAL_TABLET | Freq: Two times a day (BID) | ORAL | Status: DC

## 2014-09-02 NOTE — Progress Notes (Signed)
Quick Note:  Call pt Mag level is up a little but still low, take the mag 400mg  twice a day  Also potassium is A litle low, send KCL 47meq po daily x 5 days We will recheck magnesium level after being on oral meds in 4 weeks ______

## 2014-09-05 ENCOUNTER — Ambulatory Visit: Payer: 59 | Admitting: Family Medicine

## 2014-09-05 DIAGNOSIS — M6281 Muscle weakness (generalized): Secondary | ICD-10-CM | POA: Diagnosis not present

## 2014-09-05 NOTE — Addendum Note (Signed)
Addended by: Sharmon Revere on: 09/05/2014 09:17 AM   Modules accepted: Orders

## 2014-09-06 LAB — CLOSTRIDIUM DIFFICILE BY PCR

## 2014-09-09 ENCOUNTER — Other Ambulatory Visit: Payer: 59

## 2014-09-09 DIAGNOSIS — R197 Diarrhea, unspecified: Secondary | ICD-10-CM | POA: Diagnosis not present

## 2014-09-10 LAB — CLOSTRIDIUM DIFFICILE BY PCR: CDIFFPCR: NOT DETECTED

## 2014-09-14 DIAGNOSIS — M6281 Muscle weakness (generalized): Secondary | ICD-10-CM | POA: Diagnosis not present

## 2014-09-15 DIAGNOSIS — K921 Melena: Secondary | ICD-10-CM | POA: Diagnosis not present

## 2014-09-15 DIAGNOSIS — R197 Diarrhea, unspecified: Secondary | ICD-10-CM | POA: Diagnosis not present

## 2014-09-19 DIAGNOSIS — M6281 Muscle weakness (generalized): Secondary | ICD-10-CM | POA: Diagnosis not present

## 2014-09-19 DIAGNOSIS — T8189XA Other complications of procedures, not elsewhere classified, initial encounter: Secondary | ICD-10-CM | POA: Diagnosis not present

## 2014-09-20 DIAGNOSIS — T8189XA Other complications of procedures, not elsewhere classified, initial encounter: Secondary | ICD-10-CM | POA: Diagnosis not present

## 2014-09-28 DIAGNOSIS — M6281 Muscle weakness (generalized): Secondary | ICD-10-CM | POA: Diagnosis not present

## 2014-09-28 DIAGNOSIS — Z936 Other artificial openings of urinary tract status: Secondary | ICD-10-CM | POA: Diagnosis not present

## 2014-09-28 DIAGNOSIS — C679 Malignant neoplasm of bladder, unspecified: Secondary | ICD-10-CM | POA: Diagnosis not present

## 2014-09-30 DIAGNOSIS — T8189XA Other complications of procedures, not elsewhere classified, initial encounter: Secondary | ICD-10-CM | POA: Diagnosis not present

## 2014-10-03 ENCOUNTER — Other Ambulatory Visit: Payer: Self-pay | Admitting: Family Medicine

## 2014-10-03 DIAGNOSIS — M6281 Muscle weakness (generalized): Secondary | ICD-10-CM | POA: Diagnosis not present

## 2014-10-04 NOTE — Telephone Encounter (Signed)
Refill appropriate and filled per protocol. 

## 2014-10-05 ENCOUNTER — Telehealth: Payer: Self-pay | Admitting: Family Medicine

## 2014-10-05 DIAGNOSIS — K5669 Other intestinal obstruction: Secondary | ICD-10-CM | POA: Diagnosis not present

## 2014-10-05 DIAGNOSIS — N99538 Other complication of other stoma of urinary tract: Secondary | ICD-10-CM | POA: Diagnosis not present

## 2014-10-05 DIAGNOSIS — Z9889 Other specified postprocedural states: Secondary | ICD-10-CM | POA: Diagnosis not present

## 2014-10-05 MED ORDER — MAGNESIUM OXIDE -MG SUPPLEMENT 400 (240 MG) MG PO TABS: 2.0000 | ORAL_TABLET | Freq: Two times a day (BID) | ORAL | Status: DC

## 2014-10-05 NOTE — Telephone Encounter (Signed)
Stop elquis 2 days before colonoscopy and wait 3 days before restarting unless her GI doctor tells her otherwise

## 2014-10-05 NOTE — Telephone Encounter (Signed)
Patient also states that she requires refill on Magnesium.   Prescription sent to pharmacy.

## 2014-10-05 NOTE — Telephone Encounter (Signed)
Patient is currently taking Eliquis.   MD please advise.

## 2014-10-05 NOTE — Telephone Encounter (Signed)
Call placed to patient and patient made aware.  

## 2014-10-05 NOTE — Telephone Encounter (Signed)
Patient is calling to say that she is going on the 19th for a colonscopy, she wants to know if she needs to stop her blood thinners  [phone]

## 2014-10-07 ENCOUNTER — Other Ambulatory Visit: Payer: Self-pay | Admitting: Family Medicine

## 2014-10-07 ENCOUNTER — Encounter: Payer: Self-pay | Admitting: Family Medicine

## 2014-10-07 ENCOUNTER — Other Ambulatory Visit: Payer: Medicare Other

## 2014-10-07 DIAGNOSIS — E876 Hypokalemia: Secondary | ICD-10-CM | POA: Diagnosis not present

## 2014-10-08 LAB — COMPLETE METABOLIC PANEL WITH GFR
ALBUMIN: 3.8 g/dL (ref 3.5–5.2)
ALT: 53 U/L — AB (ref 0–35)
AST: 32 U/L (ref 0–37)
Alkaline Phosphatase: 92 U/L (ref 39–117)
BUN: 17 mg/dL (ref 6–23)
CO2: 26 meq/L (ref 19–32)
Calcium: 8.9 mg/dL (ref 8.4–10.5)
Chloride: 106 mEq/L (ref 96–112)
Creat: 1.16 mg/dL — ABNORMAL HIGH (ref 0.50–1.10)
GFR, EST AFRICAN AMERICAN: 58 mL/min — AB
GFR, Est Non African American: 50 mL/min — ABNORMAL LOW
Glucose, Bld: 84 mg/dL (ref 70–99)
Potassium: 4.2 mEq/L (ref 3.5–5.3)
Sodium: 139 mEq/L (ref 135–145)
TOTAL PROTEIN: 7 g/dL (ref 6.0–8.3)
Total Bilirubin: 0.6 mg/dL (ref 0.2–1.2)

## 2014-10-08 LAB — MAGNESIUM: Magnesium: 1.7 mg/dL (ref 1.5–2.5)

## 2014-10-18 DIAGNOSIS — E039 Hypothyroidism, unspecified: Secondary | ICD-10-CM | POA: Diagnosis not present

## 2014-10-18 DIAGNOSIS — D689 Coagulation defect, unspecified: Secondary | ICD-10-CM | POA: Diagnosis not present

## 2014-10-18 DIAGNOSIS — G4730 Sleep apnea, unspecified: Secondary | ICD-10-CM | POA: Diagnosis not present

## 2014-10-18 DIAGNOSIS — R197 Diarrhea, unspecified: Secondary | ICD-10-CM | POA: Diagnosis not present

## 2014-10-18 DIAGNOSIS — K644 Residual hemorrhoidal skin tags: Secondary | ICD-10-CM | POA: Diagnosis not present

## 2014-10-18 DIAGNOSIS — R195 Other fecal abnormalities: Secondary | ICD-10-CM | POA: Diagnosis not present

## 2014-10-18 DIAGNOSIS — Z79899 Other long term (current) drug therapy: Secondary | ICD-10-CM | POA: Diagnosis not present

## 2014-10-18 DIAGNOSIS — K219 Gastro-esophageal reflux disease without esophagitis: Secondary | ICD-10-CM | POA: Diagnosis not present

## 2014-10-18 DIAGNOSIS — K623 Rectal prolapse: Secondary | ICD-10-CM | POA: Diagnosis not present

## 2014-10-18 DIAGNOSIS — Z8551 Personal history of malignant neoplasm of bladder: Secondary | ICD-10-CM | POA: Diagnosis not present

## 2014-10-18 DIAGNOSIS — Z7982 Long term (current) use of aspirin: Secondary | ICD-10-CM | POA: Diagnosis not present

## 2014-10-18 DIAGNOSIS — K633 Ulcer of intestine: Secondary | ICD-10-CM | POA: Diagnosis not present

## 2014-10-18 DIAGNOSIS — K921 Melena: Secondary | ICD-10-CM | POA: Diagnosis not present

## 2014-10-18 DIAGNOSIS — E785 Hyperlipidemia, unspecified: Secondary | ICD-10-CM | POA: Diagnosis not present

## 2014-10-18 DIAGNOSIS — I10 Essential (primary) hypertension: Secondary | ICD-10-CM | POA: Diagnosis not present

## 2014-10-18 DIAGNOSIS — K648 Other hemorrhoids: Secondary | ICD-10-CM | POA: Diagnosis not present

## 2014-10-18 DIAGNOSIS — K640 First degree hemorrhoids: Secondary | ICD-10-CM | POA: Diagnosis not present

## 2014-10-18 DIAGNOSIS — K629 Disease of anus and rectum, unspecified: Secondary | ICD-10-CM | POA: Diagnosis not present

## 2014-10-21 ENCOUNTER — Telehealth: Payer: Self-pay | Admitting: Family Medicine

## 2014-10-21 MED ORDER — HYDROMORPHONE HCL 4 MG PO TABS: 4.0000 mg | ORAL_TABLET | ORAL | Status: DC | PRN

## 2014-10-21 NOTE — Telephone Encounter (Signed)
Prescription printed and patient made aware to come to office to pick up per VM.   Also noted patient results found by lab. Magnesium level is stable, and all other labs are normal.   MD recommends to continue taking Magnesium (2) tabs PO BID.   Call placed to patient and patient made aware per VM.

## 2014-10-21 NOTE — Telephone Encounter (Signed)
Okay to refill? 

## 2014-10-21 NOTE — Telephone Encounter (Signed)
Ok to refill Dilaudid 4mg ??  Last office visit/ refill 08/23/2014.  Patient was in office on 10/07/2014 to have Magnesium labs re-drawn. No results noted. Will F/U with lab.

## 2014-10-21 NOTE — Telephone Encounter (Signed)
Patient is calling to get refill on pain medication and see if test results were in  [phone]

## 2014-10-25 DIAGNOSIS — T8189XA Other complications of procedures, not elsewhere classified, initial encounter: Secondary | ICD-10-CM | POA: Diagnosis not present

## 2014-10-27 DIAGNOSIS — Z936 Other artificial openings of urinary tract status: Secondary | ICD-10-CM | POA: Diagnosis not present

## 2014-10-27 DIAGNOSIS — C679 Malignant neoplasm of bladder, unspecified: Secondary | ICD-10-CM | POA: Diagnosis not present

## 2014-11-08 ENCOUNTER — Encounter: Payer: Self-pay | Admitting: Family Medicine

## 2014-11-08 ENCOUNTER — Ambulatory Visit (INDEPENDENT_AMBULATORY_CARE_PROVIDER_SITE_OTHER): Payer: Medicare Other | Admitting: Family Medicine

## 2014-11-08 VITALS — BP 120/68 | HR 72 | Temp 97.3°F | Resp 16 | Ht 65.0 in | Wt 153.0 lb

## 2014-11-08 DIAGNOSIS — K219 Gastro-esophageal reflux disease without esophagitis: Secondary | ICD-10-CM

## 2014-11-08 DIAGNOSIS — E038 Other specified hypothyroidism: Secondary | ICD-10-CM | POA: Diagnosis not present

## 2014-11-08 DIAGNOSIS — I82402 Acute embolism and thrombosis of unspecified deep veins of left lower extremity: Secondary | ICD-10-CM

## 2014-11-08 DIAGNOSIS — E785 Hyperlipidemia, unspecified: Secondary | ICD-10-CM

## 2014-11-08 DIAGNOSIS — K626 Ulcer of anus and rectum: Secondary | ICD-10-CM | POA: Diagnosis not present

## 2014-11-08 DIAGNOSIS — R197 Diarrhea, unspecified: Secondary | ICD-10-CM

## 2014-11-08 DIAGNOSIS — I10 Essential (primary) hypertension: Secondary | ICD-10-CM | POA: Diagnosis not present

## 2014-11-08 MED ORDER — HYDROCORTISONE ACE-PRAMOXINE 1-1 % RE FOAM: 1.0000 | Freq: Two times a day (BID) | RECTAL | Status: DC | PRN

## 2014-11-08 MED ORDER — PRAVASTATIN SODIUM 20 MG PO TABS: 20.0000 mg | ORAL_TABLET | Freq: Every day | ORAL | Status: DC

## 2014-11-08 MED ORDER — LEVOTHYROXINE SODIUM 75 MCG PO TABS: 75.0000 ug | ORAL_TABLET | Freq: Every day | ORAL | Status: DC

## 2014-11-08 MED ORDER — SUVOREXANT 10 MG PO TABS: 10.0000 | ORAL_TABLET | Freq: Every evening | ORAL | Status: DC | PRN

## 2014-11-08 MED ORDER — MAGNESIUM OXIDE -MG SUPPLEMENT 400 (240 MG) MG PO TABS: 1.0000 | ORAL_TABLET | Freq: Two times a day (BID) | ORAL | Status: DC

## 2014-11-08 NOTE — Progress Notes (Signed)
Patient ID: [name], female   DOB: [birth year], 64 y.o.   MRN: [id number]   Subjective:    Patient ID: [name], female    DOB: 1951/04/25, 64 y.o.   MRN: [id number]  Patient presents for Rectal Pain  patient here with rectal pain and bleeding on and off for the past 4 months. She is actually Re: [name] evaluated by her surgeon who did her bowel repair back in December as well as gastroenterology at [facility]. She had a colonoscopy done about 3 weeks ago which showed internal and external hemorrhoids as well as a rectal ulcer. She was told that she needs to stop the magnesium and he gave her Imodium however she has had chronic diarrhea for quite some time and they were actually keeping her bowels soft because of her recurrent bowel obstruction so this did not make very much sense that this was the only cause. She is now off of laxatives as well. She is tolerating food typically without any difficulty. She states that she was given some type of suppositories which did help at the end of the visit she called back and this was Proctafoam but this was actually given by some on call doctor not at her last gastroenterology visit.  Review of her medication she is still taking aspirin for dose and she is on Eliquis, she also wonders no she could stop some of her other medications therefore I spent a significant time reviewing her meds.    Review Of Systems:  GEN- denies fatigue, fever, weight loss,weakness, recent illness HEENT- denies eye drainage, change in vision, nasal discharge, CVS- denies chest pain, palpitations RESP- denies SOB, cough, wheeze ABD- denies N/V, change in stools, abd pain GU- denies dysuria, hematuria, dribbling, incontinence MSK- denies joint pain, muscle aches, injury Neuro- denies headache, dizziness, syncope, seizure activity       Objective:    BP 120/68 mmHg  Pulse 72  Temp(Src) 97.3 F (36.3 C) (Oral)  Resp 16  Ht 5\' 5"  (1.651 m)   Wt 153 lb (69.4 kg)  BMI 25.46 kg/m2 GEN- NAD, alert and oriented x3 HEENT- PERRL, EOMI, non injected sclera, pink conjunctiva, MMM, oropharynx clear Neck- Supple, no thyromegaly CVS- RRR, no murmur RESP-CTAB ABD-NABS,soft,NT,ND ,urostomy in tact EXT- No edema Pulses- Radial, 2+        Assessment & Plan:      Problem List Items Addressed This Visit    Rectal ulcer   Hypothyroidism   Relevant Medications   levothyroxine (SYNTHROID, LEVOTHROID) 75 MCG tablet   Other Relevant Orders   TSH   T3, free   Hyperlipidemia   Relevant Medications   pravastatin (PRAVACHOL) 20 MG tablet   Other Relevant Orders   Lipid panel   GERD (gastroesophageal reflux disease)   Relevant Medications   loperamide (IMODIUM) 2 MG capsule   Essential hypertension - Primary   Relevant Medications   pravastatin (PRAVACHOL) 20 MG tablet   Other Relevant Orders   CBC with Differential/Platelet   Comprehensive metabolic panel   Diarrhea    Other Visit Diagnoses    Hypomagnesemia        Relevant Orders    Magnesium       Note: This dictation was prepared with Dragon dictation along with smaller phrase technology. Any transcriptional errors that result from this process are unintentional.

## 2014-11-08 NOTE — Patient Instructions (Addendum)
Decrease magnesium to 1 tablet twice a day for 1 week then stop  Get the blood drawn in 2 weeks  Try Melatonin for sleep , over the counter Belsomra sleeping pill  Stop the aspirin Stop the zantac F/u pending results or in 3 months

## 2014-11-09 ENCOUNTER — Encounter: Payer: Self-pay | Admitting: Family Medicine

## 2014-11-09 ENCOUNTER — Other Ambulatory Visit: Payer: Self-pay | Admitting: Family Medicine

## 2014-11-09 NOTE — Assessment & Plan Note (Signed)
Due for TFT, continue synthroid

## 2014-11-09 NOTE — Assessment & Plan Note (Signed)
D/c zantac and continue PPI

## 2014-11-09 NOTE — Assessment & Plan Note (Addendum)
D/C Full dose ASA on Eliquis PICC line is now out ,on review of [facility] Records shows Pulmonary Embolism and DVT from 2015, therefore I am going to continue the Eliquis as no active bleeding, no cardiac history to need both Full dose ASA and Eliquis

## 2014-11-09 NOTE — Assessment & Plan Note (Signed)
Well controlled 

## 2014-11-09 NOTE — Assessment & Plan Note (Signed)
No particular treatment given??, proctofoam does help, this was prescribed again Regarding the chronic diarrhea , i am concerned everyone is not on the same page, she has been on laxatives for many years, this resulted in hypomagnesium, upon replacement her symptoms did improve, she now has been on immodium and constipated therefore taking stool softners at the same time which defeats purpose of immodium. There is also a lot of confusion on behalf of the pt Advised to stop all bowel meds for a few days and see if she resets herself. I will plan to decrease and taper off magnesium and see if she has restored herself as now she has more free range diet I will contact her GI to see what plan is for her chronic rectal pain.

## 2014-11-09 NOTE — Telephone Encounter (Signed)
Refill appropriate and filled per protocol. 

## 2014-11-18 ENCOUNTER — Encounter: Payer: Self-pay | Admitting: *Deleted

## 2014-11-18 ENCOUNTER — Telehealth: Payer: Self-pay | Admitting: Family Medicine

## 2014-11-18 ENCOUNTER — Telehealth: Payer: Self-pay | Admitting: *Deleted

## 2014-11-18 MED ORDER — HYDROMORPHONE HCL 4 MG PO TABS: 4.0000 mg | ORAL_TABLET | ORAL | Status: DC | PRN

## 2014-11-18 NOTE — Telephone Encounter (Signed)
Okay to refill? 

## 2014-11-18 NOTE — Telephone Encounter (Signed)
Ok to refill??  Last office visit 11/08/2014.  Last refill 10/21/2014.

## 2014-11-18 NOTE — Telephone Encounter (Signed)
Call placed to patient. LMTRC.  

## 2014-11-18 NOTE — Telephone Encounter (Signed)
Received call from patient daughter Primus Bravo and patient insurance company.   Reports that patient has not been able to receive Proctofoam HC 1:1. Advised PA was submitted on 11/08/2014, but was denied. No further information given to office.   Insurance advised that PA was denied on 11/11/2014 D/T no further clinical information was submitted from request sent to office on 11/10/2014. No request received.   Was given telephone number to [employer] to re-submit PA clinical information. Advised patient daughter that since PA was already denied, another PA would not need to be submitted, an appeal would need to be filed.   Call placed to [employer] and was advised that appeal would be required to be submitted in writing via either letter or fax. Appeal faxed to insurance.   Call placed to patient and patient made aware.   Patient daughter Rosalyne requested call from  MD to discuss medication. Also patient requested information about other possible treatments for rectal ulcers.   MD please advise.

## 2014-11-18 NOTE — Telephone Encounter (Signed)
Patient requesting speak with you regarding her pain medication hydromorphone [phone]

## 2014-11-18 NOTE — Telephone Encounter (Signed)
Prescription printed and patient made aware to come to office to pick up.  

## 2014-11-18 NOTE — Telephone Encounter (Signed)
Spoke with Pt, Insurance has denied proctafoam, they actually are not covering any hemmorid therapy She needs f/u with [facility], she will call Monday For now keep bowels soft, note she is off magnesium She will use Ansuol she has at home, and sitz bath through the weekend

## 2014-11-23 DIAGNOSIS — K649 Unspecified hemorrhoids: Secondary | ICD-10-CM | POA: Diagnosis not present

## 2014-11-23 DIAGNOSIS — K623 Rectal prolapse: Secondary | ICD-10-CM | POA: Diagnosis not present

## 2014-11-25 DIAGNOSIS — I10 Essential (primary) hypertension: Secondary | ICD-10-CM | POA: Diagnosis not present

## 2014-11-25 DIAGNOSIS — E038 Other specified hypothyroidism: Secondary | ICD-10-CM | POA: Diagnosis not present

## 2014-11-25 DIAGNOSIS — E785 Hyperlipidemia, unspecified: Secondary | ICD-10-CM | POA: Diagnosis not present

## 2014-11-26 LAB — COMPREHENSIVE METABOLIC PANEL
ALT: 19 U/L (ref 0–35)
AST: 17 U/L (ref 0–37)
Albumin: 3.9 g/dL (ref 3.5–5.2)
Alkaline Phosphatase: 89 U/L (ref 39–117)
BILIRUBIN TOTAL: 0.6 mg/dL (ref 0.2–1.2)
BUN: 14 mg/dL (ref 6–23)
CO2: 24 mEq/L (ref 19–32)
CREATININE: 0.95 mg/dL (ref 0.50–1.10)
Calcium: 8.4 mg/dL (ref 8.4–10.5)
Chloride: 106 mEq/L (ref 96–112)
GLUCOSE: 77 mg/dL (ref 70–99)
Potassium: 3.5 mEq/L (ref 3.5–5.3)
Sodium: 140 mEq/L (ref 135–145)
Total Protein: 6.9 g/dL (ref 6.0–8.3)

## 2014-11-26 LAB — CBC WITH DIFFERENTIAL/PLATELET
Basophils Absolute: 0 10*3/uL (ref 0.0–0.1)
Basophils Relative: 0 % (ref 0–1)
Eosinophils Absolute: 0.1 10*3/uL (ref 0.0–0.7)
Eosinophils Relative: 2 % (ref 0–5)
HCT: 35.5 % — ABNORMAL LOW (ref 36.0–46.0)
Hemoglobin: 11.3 g/dL — ABNORMAL LOW (ref 12.0–15.0)
Lymphocytes Relative: 30 % (ref 12–46)
Lymphs Abs: 1.9 10*3/uL (ref 0.7–4.0)
MCH: 25.4 pg — ABNORMAL LOW (ref 26.0–34.0)
MCHC: 31.8 g/dL (ref 30.0–36.0)
MCV: 79.8 fL (ref 78.0–100.0)
MPV: 9.7 fL (ref 8.6–12.4)
Monocytes Absolute: 0.4 10*3/uL (ref 0.1–1.0)
Monocytes Relative: 7 % (ref 3–12)
NEUTROS PCT: 61 % (ref 43–77)
Neutro Abs: 3.8 10*3/uL (ref 1.7–7.7)
Platelets: 310 10*3/uL (ref 150–400)
RBC: 4.45 MIL/uL (ref 3.87–5.11)
RDW: 16.6 % — AB (ref 11.5–15.5)
WBC: 6.3 10*3/uL (ref 4.0–10.5)

## 2014-11-26 LAB — LIPID PANEL
Cholesterol: 144 mg/dL (ref 0–200)
HDL: 62 mg/dL (ref >=46)
LDL CALC: 57 mg/dL (ref 0–99)
Total CHOL/HDL Ratio: 2.3 Ratio
Triglycerides: 127 mg/dL (ref <150)
VLDL: 25 mg/dL (ref 0–40)

## 2014-11-26 LAB — MAGNESIUM: Magnesium: 1 mg/dL — ABNORMAL LOW (ref 1.5–2.5)

## 2014-11-26 LAB — T3, FREE: T3 FREE: 2.1 pg/mL — AB (ref 2.3–4.2)

## 2014-11-26 LAB — TSH: TSH: 31.191 u[IU]/mL — AB (ref 0.350–4.500)

## 2014-11-29 DIAGNOSIS — Z936 Other artificial openings of urinary tract status: Secondary | ICD-10-CM | POA: Diagnosis not present

## 2014-11-29 DIAGNOSIS — C679 Malignant neoplasm of bladder, unspecified: Secondary | ICD-10-CM | POA: Diagnosis not present

## 2014-12-01 ENCOUNTER — Other Ambulatory Visit: Payer: Self-pay | Admitting: *Deleted

## 2014-12-01 DIAGNOSIS — E039 Hypothyroidism, unspecified: Secondary | ICD-10-CM

## 2014-12-27 DIAGNOSIS — Z0389 Encounter for observation for other suspected diseases and conditions ruled out: Secondary | ICD-10-CM | POA: Diagnosis not present

## 2014-12-27 DIAGNOSIS — K600 Acute anal fissure: Secondary | ICD-10-CM | POA: Diagnosis not present

## 2014-12-27 DIAGNOSIS — K623 Rectal prolapse: Secondary | ICD-10-CM | POA: Diagnosis not present

## 2015-01-06 ENCOUNTER — Telehealth: Payer: Self-pay | Admitting: Family Medicine

## 2015-01-06 ENCOUNTER — Other Ambulatory Visit: Payer: Self-pay | Admitting: *Deleted

## 2015-01-06 DIAGNOSIS — Z936 Other artificial openings of urinary tract status: Secondary | ICD-10-CM | POA: Diagnosis not present

## 2015-01-06 DIAGNOSIS — C679 Malignant neoplasm of bladder, unspecified: Secondary | ICD-10-CM | POA: Diagnosis not present

## 2015-01-06 MED ORDER — HYDROMORPHONE HCL 4 MG PO TABS: 4.0000 mg | ORAL_TABLET | ORAL | Status: DC | PRN

## 2015-01-06 NOTE — Telephone Encounter (Signed)
Ok to refill Dilaudid??  Last office visit 11/08/2014.  Last refill 11/18/2014.

## 2015-01-06 NOTE — Telephone Encounter (Signed)
Patient needs rx written for her pain med  Please call when ready at [phone]

## 2015-01-06 NOTE — Telephone Encounter (Signed)
Prescription printed and patient made aware to come to office to pick up on 01/06/2015 after 2pm per VM.

## 2015-01-06 NOTE — Telephone Encounter (Signed)
Okay to refill? 

## 2015-01-07 ENCOUNTER — Other Ambulatory Visit: Payer: Self-pay | Admitting: Family Medicine

## 2015-01-09 NOTE — Telephone Encounter (Signed)
Medication refilled per protocol. 

## 2015-01-10 ENCOUNTER — Telehealth: Payer: Self-pay | Admitting: Family Medicine

## 2015-01-10 NOTE — Telephone Encounter (Signed)
PA rec'd and approved from [place]  Approved for 6 months (07/13/2015)  [id number]  PA faxed to pharmacy

## 2015-01-10 NOTE — Telephone Encounter (Signed)
PA requested for Eliquis.  Unable to submit through Cover My Meds.  CMM faxed me form to be completed and it has been sent to [facility]

## 2015-01-23 ENCOUNTER — Ambulatory Visit (INDEPENDENT_AMBULATORY_CARE_PROVIDER_SITE_OTHER): Payer: Medicare Other | Admitting: *Deleted

## 2015-01-23 DIAGNOSIS — K219 Gastro-esophageal reflux disease without esophagitis: Secondary | ICD-10-CM | POA: Diagnosis not present

## 2015-01-23 DIAGNOSIS — Z111 Encounter for screening for respiratory tuberculosis: Secondary | ICD-10-CM | POA: Diagnosis not present

## 2015-01-23 DIAGNOSIS — K626 Ulcer of anus and rectum: Secondary | ICD-10-CM | POA: Diagnosis not present

## 2015-01-23 LAB — MAGNESIUM: Magnesium: 1.2 mg/dL — ABNORMAL LOW (ref 1.5–2.5)

## 2015-01-23 NOTE — Patient Instructions (Signed)
Return on Wed to have PPD read.

## 2015-01-23 NOTE — Progress Notes (Signed)
Patient ID: [name], female   DOB: June 24, 1951, 64 y.o.   MRN: [id number]  Patient seen in office for TB testing.   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   Also states that MD requested in May that she F/U to have magnesium re-drawn.  Blood drawn and PPD placed.   Advised to return in 72 hours to have PPD read.

## 2015-01-25 ENCOUNTER — Other Ambulatory Visit: Payer: Medicare Other

## 2015-01-25 ENCOUNTER — Ambulatory Visit: Payer: Medicare Other | Admitting: *Deleted

## 2015-01-25 ENCOUNTER — Telehealth: Payer: Self-pay | Admitting: *Deleted

## 2015-01-25 DIAGNOSIS — Z111 Encounter for screening for respiratory tuberculosis: Secondary | ICD-10-CM

## 2015-01-25 LAB — TB SKIN TEST
INDURATION: 0 mm
TB SKIN TEST: NEGATIVE

## 2015-01-25 NOTE — Progress Notes (Signed)
Patient ID: [name], female   DOB: Feb 08, 1951, 64 y.o.   MRN: [id number]  Patient seen in office to have PPD read.   No induration or erythema noted.   Area noted negative for TB exposure.

## 2015-01-25 NOTE — Telephone Encounter (Signed)
Patient in office to have TB read.   States that she has been having productive cough with clear mucus and nasal drainage x1 day.   Advised to use OTC Mucinex or Robitussin for cough and nasal saline for congestion. Advised to increase rest and to increase fluid intake. Recommended that if symptoms worsen or persist >1 week to contact office for visit.

## 2015-01-27 DIAGNOSIS — H1131 Conjunctival hemorrhage, right eye: Secondary | ICD-10-CM | POA: Diagnosis not present

## 2015-01-27 DIAGNOSIS — F17200 Nicotine dependence, unspecified, uncomplicated: Secondary | ICD-10-CM | POA: Diagnosis not present

## 2015-01-27 DIAGNOSIS — Z79899 Other long term (current) drug therapy: Secondary | ICD-10-CM | POA: Diagnosis not present

## 2015-01-27 DIAGNOSIS — Z7902 Long term (current) use of antithrombotics/antiplatelets: Secondary | ICD-10-CM | POA: Diagnosis not present

## 2015-02-01 ENCOUNTER — Other Ambulatory Visit: Payer: Self-pay | Admitting: Family Medicine

## 2015-02-01 DIAGNOSIS — Z936 Other artificial openings of urinary tract status: Secondary | ICD-10-CM | POA: Diagnosis not present

## 2015-02-01 DIAGNOSIS — C679 Malignant neoplasm of bladder, unspecified: Secondary | ICD-10-CM | POA: Diagnosis not present

## 2015-02-01 MED ORDER — HYDROMORPHONE HCL 4 MG PO TABS: 4.0000 mg | ORAL_TABLET | ORAL | Status: DC | PRN

## 2015-02-01 NOTE — Telephone Encounter (Signed)
Patient is calling to get rx for her pain medication [phone] when ready

## 2015-02-01 NOTE — Telephone Encounter (Signed)
What medication

## 2015-02-01 NOTE — Telephone Encounter (Signed)
Ok to refill Dilaudid??  Last office visit 11/08/2014.   Last refill 01/06/2015

## 2015-02-01 NOTE — Telephone Encounter (Signed)
Okay to refill? 

## 2015-02-01 NOTE — Telephone Encounter (Signed)
Prescription printed and patient made aware to come to office to pick up on 02/02/2015.

## 2015-02-10 DIAGNOSIS — Z936 Other artificial openings of urinary tract status: Secondary | ICD-10-CM | POA: Diagnosis not present

## 2015-02-10 DIAGNOSIS — C679 Malignant neoplasm of bladder, unspecified: Secondary | ICD-10-CM | POA: Diagnosis not present

## 2015-02-13 DIAGNOSIS — K6289 Other specified diseases of anus and rectum: Secondary | ICD-10-CM | POA: Diagnosis not present

## 2015-02-22 DIAGNOSIS — G4730 Sleep apnea, unspecified: Secondary | ICD-10-CM | POA: Diagnosis not present

## 2015-02-22 DIAGNOSIS — Z79899 Other long term (current) drug therapy: Secondary | ICD-10-CM | POA: Diagnosis not present

## 2015-02-22 DIAGNOSIS — E785 Hyperlipidemia, unspecified: Secondary | ICD-10-CM | POA: Diagnosis not present

## 2015-02-22 DIAGNOSIS — Z5181 Encounter for therapeutic drug level monitoring: Secondary | ICD-10-CM | POA: Diagnosis not present

## 2015-02-22 DIAGNOSIS — K219 Gastro-esophageal reflux disease without esophagitis: Secondary | ICD-10-CM | POA: Diagnosis not present

## 2015-02-22 DIAGNOSIS — K6289 Other specified diseases of anus and rectum: Secondary | ICD-10-CM | POA: Diagnosis not present

## 2015-02-22 DIAGNOSIS — I10 Essential (primary) hypertension: Secondary | ICD-10-CM | POA: Diagnosis not present

## 2015-02-24 DIAGNOSIS — E785 Hyperlipidemia, unspecified: Secondary | ICD-10-CM | POA: Diagnosis not present

## 2015-02-24 DIAGNOSIS — Z86711 Personal history of pulmonary embolism: Secondary | ICD-10-CM | POA: Diagnosis not present

## 2015-02-24 DIAGNOSIS — A630 Anogenital (venereal) warts: Secondary | ICD-10-CM | POA: Diagnosis not present

## 2015-02-24 DIAGNOSIS — Z7901 Long term (current) use of anticoagulants: Secondary | ICD-10-CM | POA: Diagnosis not present

## 2015-02-24 DIAGNOSIS — Z87891 Personal history of nicotine dependence: Secondary | ICD-10-CM | POA: Diagnosis not present

## 2015-02-24 DIAGNOSIS — I10 Essential (primary) hypertension: Secondary | ICD-10-CM | POA: Diagnosis not present

## 2015-02-24 DIAGNOSIS — Z79899 Other long term (current) drug therapy: Secondary | ICD-10-CM | POA: Diagnosis not present

## 2015-02-24 DIAGNOSIS — L859 Epidermal thickening, unspecified: Secondary | ICD-10-CM | POA: Diagnosis not present

## 2015-02-24 DIAGNOSIS — K6289 Other specified diseases of anus and rectum: Secondary | ICD-10-CM | POA: Diagnosis not present

## 2015-02-24 DIAGNOSIS — E039 Hypothyroidism, unspecified: Secondary | ICD-10-CM | POA: Diagnosis not present

## 2015-03-02 DIAGNOSIS — C679 Malignant neoplasm of bladder, unspecified: Secondary | ICD-10-CM | POA: Diagnosis not present

## 2015-03-02 DIAGNOSIS — Z936 Other artificial openings of urinary tract status: Secondary | ICD-10-CM | POA: Diagnosis not present

## 2015-03-05 DIAGNOSIS — Z79899 Other long term (current) drug therapy: Secondary | ICD-10-CM | POA: Diagnosis not present

## 2015-03-05 DIAGNOSIS — F17200 Nicotine dependence, unspecified, uncomplicated: Secondary | ICD-10-CM | POA: Diagnosis not present

## 2015-03-05 DIAGNOSIS — Z8551 Personal history of malignant neoplasm of bladder: Secondary | ICD-10-CM | POA: Diagnosis not present

## 2015-03-05 DIAGNOSIS — N3001 Acute cystitis with hematuria: Secondary | ICD-10-CM | POA: Diagnosis not present

## 2015-03-05 DIAGNOSIS — K6289 Other specified diseases of anus and rectum: Secondary | ICD-10-CM | POA: Diagnosis not present

## 2015-03-05 DIAGNOSIS — N390 Urinary tract infection, site not specified: Secondary | ICD-10-CM | POA: Diagnosis not present

## 2015-03-05 DIAGNOSIS — E780 Pure hypercholesterolemia: Secondary | ICD-10-CM | POA: Diagnosis not present

## 2015-03-13 DIAGNOSIS — R197 Diarrhea, unspecified: Secondary | ICD-10-CM | POA: Diagnosis not present

## 2015-03-13 DIAGNOSIS — Z8551 Personal history of malignant neoplasm of bladder: Secondary | ICD-10-CM | POA: Diagnosis not present

## 2015-03-13 DIAGNOSIS — K5660 Unspecified intestinal obstruction: Secondary | ICD-10-CM | POA: Diagnosis not present

## 2015-03-13 DIAGNOSIS — Z936 Other artificial openings of urinary tract status: Secondary | ICD-10-CM | POA: Diagnosis not present

## 2015-03-13 DIAGNOSIS — E780 Pure hypercholesterolemia: Secondary | ICD-10-CM | POA: Diagnosis not present

## 2015-03-13 DIAGNOSIS — F17200 Nicotine dependence, unspecified, uncomplicated: Secondary | ICD-10-CM | POA: Diagnosis not present

## 2015-03-13 DIAGNOSIS — Z79899 Other long term (current) drug therapy: Secondary | ICD-10-CM | POA: Diagnosis not present

## 2015-03-13 DIAGNOSIS — K6289 Other specified diseases of anus and rectum: Secondary | ICD-10-CM | POA: Diagnosis not present

## 2015-03-20 ENCOUNTER — Telehealth: Payer: Self-pay | Admitting: Family Medicine

## 2015-03-20 MED ORDER — HYDROMORPHONE HCL 4 MG PO TABS: 4.0000 mg | ORAL_TABLET | ORAL | Status: DC | PRN

## 2015-03-20 NOTE — Telephone Encounter (Signed)
ok 

## 2015-03-20 NOTE — Telephone Encounter (Signed)
Prescription printed and patient made aware to come to office to pick up on 03/21/2015.

## 2015-03-20 NOTE — Telephone Encounter (Signed)
Ok to refill??  Last office visit 11/08/2014.  Last refill 02/01/2015.

## 2015-03-20 NOTE — Telephone Encounter (Signed)
Patient calling to get rx for her hydromorphone  [phone] when ready

## 2015-03-27 ENCOUNTER — Other Ambulatory Visit: Payer: Self-pay

## 2015-03-27 DIAGNOSIS — Z1231 Encounter for screening mammogram for malignant neoplasm of breast: Secondary | ICD-10-CM

## 2015-03-27 DIAGNOSIS — Z803 Family history of malignant neoplasm of breast: Secondary | ICD-10-CM

## 2015-03-28 DIAGNOSIS — K6289 Other specified diseases of anus and rectum: Secondary | ICD-10-CM | POA: Diagnosis not present

## 2015-04-01 DIAGNOSIS — C679 Malignant neoplasm of bladder, unspecified: Secondary | ICD-10-CM | POA: Diagnosis not present

## 2015-04-01 DIAGNOSIS — Z936 Other artificial openings of urinary tract status: Secondary | ICD-10-CM | POA: Diagnosis not present

## 2015-04-17 DIAGNOSIS — Z936 Other artificial openings of urinary tract status: Secondary | ICD-10-CM | POA: Diagnosis not present

## 2015-04-17 DIAGNOSIS — Z79899 Other long term (current) drug therapy: Secondary | ICD-10-CM | POA: Diagnosis not present

## 2015-04-17 DIAGNOSIS — K6289 Other specified diseases of anus and rectum: Secondary | ICD-10-CM | POA: Diagnosis not present

## 2015-04-17 DIAGNOSIS — N390 Urinary tract infection, site not specified: Secondary | ICD-10-CM | POA: Diagnosis not present

## 2015-04-17 DIAGNOSIS — Z8551 Personal history of malignant neoplasm of bladder: Secondary | ICD-10-CM | POA: Diagnosis not present

## 2015-04-17 DIAGNOSIS — Z79891 Long term (current) use of opiate analgesic: Secondary | ICD-10-CM | POA: Diagnosis not present

## 2015-04-17 DIAGNOSIS — Z7901 Long term (current) use of anticoagulants: Secondary | ICD-10-CM | POA: Diagnosis not present

## 2015-04-17 DIAGNOSIS — I82623 Acute embolism and thrombosis of deep veins of upper extremity, bilateral: Secondary | ICD-10-CM | POA: Diagnosis not present

## 2015-04-17 DIAGNOSIS — Z906 Acquired absence of other parts of urinary tract: Secondary | ICD-10-CM | POA: Diagnosis not present

## 2015-04-17 DIAGNOSIS — Z87891 Personal history of nicotine dependence: Secondary | ICD-10-CM | POA: Diagnosis not present

## 2015-04-17 DIAGNOSIS — R159 Full incontinence of feces: Secondary | ICD-10-CM | POA: Diagnosis not present

## 2015-04-18 DIAGNOSIS — Z86718 Personal history of other venous thrombosis and embolism: Secondary | ICD-10-CM | POA: Diagnosis not present

## 2015-04-18 DIAGNOSIS — Z79899 Other long term (current) drug therapy: Secondary | ICD-10-CM | POA: Diagnosis not present

## 2015-04-18 DIAGNOSIS — D487 Neoplasm of uncertain behavior of other specified sites: Secondary | ICD-10-CM | POA: Diagnosis not present

## 2015-04-18 DIAGNOSIS — A048 Other specified bacterial intestinal infections: Secondary | ICD-10-CM | POA: Diagnosis not present

## 2015-04-18 DIAGNOSIS — A038 Other shigellosis: Secondary | ICD-10-CM | POA: Diagnosis not present

## 2015-04-18 DIAGNOSIS — Z79891 Long term (current) use of opiate analgesic: Secondary | ICD-10-CM | POA: Diagnosis not present

## 2015-04-18 DIAGNOSIS — K6289 Other specified diseases of anus and rectum: Secondary | ICD-10-CM | POA: Diagnosis not present

## 2015-04-18 DIAGNOSIS — R197 Diarrhea, unspecified: Secondary | ICD-10-CM | POA: Diagnosis not present

## 2015-04-18 DIAGNOSIS — I82623 Acute embolism and thrombosis of deep veins of upper extremity, bilateral: Secondary | ICD-10-CM | POA: Diagnosis not present

## 2015-04-18 DIAGNOSIS — Z936 Other artificial openings of urinary tract status: Secondary | ICD-10-CM | POA: Diagnosis not present

## 2015-04-18 DIAGNOSIS — E876 Hypokalemia: Secondary | ICD-10-CM | POA: Diagnosis not present

## 2015-04-18 DIAGNOSIS — D509 Iron deficiency anemia, unspecified: Secondary | ICD-10-CM | POA: Diagnosis not present

## 2015-04-18 DIAGNOSIS — K626 Ulcer of anus and rectum: Secondary | ICD-10-CM | POA: Diagnosis not present

## 2015-04-18 DIAGNOSIS — R1907 Generalized intra-abdominal and pelvic swelling, mass and lump: Secondary | ICD-10-CM | POA: Diagnosis not present

## 2015-04-18 DIAGNOSIS — K921 Melena: Secondary | ICD-10-CM | POA: Diagnosis not present

## 2015-04-18 DIAGNOSIS — R188 Other ascites: Secondary | ICD-10-CM | POA: Diagnosis not present

## 2015-04-18 DIAGNOSIS — G8929 Other chronic pain: Secondary | ICD-10-CM | POA: Diagnosis not present

## 2015-04-18 DIAGNOSIS — N390 Urinary tract infection, site not specified: Secondary | ICD-10-CM | POA: Diagnosis not present

## 2015-04-18 DIAGNOSIS — Z87891 Personal history of nicotine dependence: Secondary | ICD-10-CM | POA: Diagnosis not present

## 2015-04-18 DIAGNOSIS — Z7901 Long term (current) use of anticoagulants: Secondary | ICD-10-CM | POA: Diagnosis not present

## 2015-04-18 DIAGNOSIS — Z906 Acquired absence of other parts of urinary tract: Secondary | ICD-10-CM | POA: Diagnosis not present

## 2015-04-18 DIAGNOSIS — Z8551 Personal history of malignant neoplasm of bladder: Secondary | ICD-10-CM | POA: Diagnosis not present

## 2015-04-18 DIAGNOSIS — R159 Full incontinence of feces: Secondary | ICD-10-CM | POA: Diagnosis not present

## 2015-04-24 ENCOUNTER — Telehealth: Payer: Self-pay | Admitting: *Deleted

## 2015-04-24 MED ORDER — APIXABAN 5 MG PO TABS: 5.0000 mg | ORAL_TABLET | Freq: Two times a day (BID) | ORAL | Status: DC

## 2015-04-24 MED ORDER — PRAVASTATIN SODIUM 20 MG PO TABS: ORAL_TABLET | ORAL | Status: DC

## 2015-04-24 MED ORDER — DEXLANSOPRAZOLE 60 MG PO CPDR: 1.0000 | DELAYED_RELEASE_CAPSULE | Freq: Every day | ORAL | Status: DC

## 2015-04-24 NOTE — Telephone Encounter (Signed)
Pt called wants her medications Eliquis, Dexilant, and pravasatin [facility] [place] at [address], states she is at her son home helping with the family and has run out of her medication.   [place] [phone]  Call back number [phone]

## 2015-04-24 NOTE — Telephone Encounter (Signed)
Prescription sent to pharmacy. .   Call placed to patient and patient made aware.  

## 2015-04-25 ENCOUNTER — Telehealth: Payer: Self-pay | Admitting: Family Medicine

## 2015-04-25 ENCOUNTER — Other Ambulatory Visit: Payer: Self-pay | Admitting: *Deleted

## 2015-04-25 MED ORDER — LEVOTHYROXINE SODIUM 75 MCG PO TABS: 75.0000 ug | ORAL_TABLET | Freq: Every day | ORAL | Status: DC

## 2015-04-25 MED ORDER — FUROSEMIDE 40 MG PO TABS: 40.0000 mg | ORAL_TABLET | Freq: Two times a day (BID) | ORAL | Status: DC

## 2015-04-25 MED ORDER — ONDANSETRON 8 MG PO TBDP: 8.0000 mg | ORAL_TABLET | Freq: Three times a day (TID) | ORAL | Status: DC | PRN

## 2015-04-25 NOTE — Telephone Encounter (Signed)
Patient is calling to say that she forgot to say yesterday that she needed her levothyroxine and her zofran refilled [facility] [place] [phone]

## 2015-04-25 NOTE — Telephone Encounter (Signed)
Received call from patient.   Reports that she requires [facility] [place].   Prescription sent to pharmacy.

## 2015-04-25 NOTE — Telephone Encounter (Signed)
Prescription sent to pharmacy.

## 2015-05-02 DIAGNOSIS — C679 Malignant neoplasm of bladder, unspecified: Secondary | ICD-10-CM | POA: Diagnosis not present

## 2015-05-02 DIAGNOSIS — Z936 Other artificial openings of urinary tract status: Secondary | ICD-10-CM | POA: Diagnosis not present

## 2015-05-23 ENCOUNTER — Other Ambulatory Visit: Payer: Self-pay | Admitting: Family Medicine

## 2015-05-23 ENCOUNTER — Encounter: Payer: Self-pay | Admitting: Family Medicine

## 2015-05-23 MED ORDER — LEVOTHYROXINE SODIUM 75 MCG PO TABS: 75.0000 ug | ORAL_TABLET | Freq: Every day | ORAL | Status: DC

## 2015-05-23 MED ORDER — FUROSEMIDE 40 MG PO TABS: 40.0000 mg | ORAL_TABLET | Freq: Two times a day (BID) | ORAL | Status: DC

## 2015-05-23 MED ORDER — PRAVASTATIN SODIUM 20 MG PO TABS: ORAL_TABLET | ORAL | Status: DC

## 2015-05-23 NOTE — Telephone Encounter (Signed)
Medication refilled per protocol. 

## 2015-05-23 NOTE — Telephone Encounter (Signed)
Medication refill for one time only.  Patient needs to be seen.  Letter sent for patient to call and schedule 

## 2015-05-29 DIAGNOSIS — Z0389 Encounter for observation for other suspected diseases and conditions ruled out: Secondary | ICD-10-CM | POA: Diagnosis not present

## 2015-05-29 DIAGNOSIS — Z9889 Other specified postprocedural states: Secondary | ICD-10-CM | POA: Diagnosis not present

## 2015-05-29 DIAGNOSIS — Z8551 Personal history of malignant neoplasm of bladder: Secondary | ICD-10-CM | POA: Diagnosis not present

## 2015-05-30 ENCOUNTER — Other Ambulatory Visit: Payer: Self-pay | Admitting: Family Medicine

## 2015-05-30 DIAGNOSIS — K6289 Other specified diseases of anus and rectum: Secondary | ICD-10-CM | POA: Diagnosis not present

## 2015-05-30 NOTE — Telephone Encounter (Signed)
Refill appropriate and filled per protocol. 

## 2015-06-02 ENCOUNTER — Ambulatory Visit (INDEPENDENT_AMBULATORY_CARE_PROVIDER_SITE_OTHER): Payer: Medicare Other | Admitting: Family Medicine

## 2015-06-02 ENCOUNTER — Encounter: Payer: Self-pay | Admitting: Family Medicine

## 2015-06-02 VITALS — BP 128/72 | HR 68 | Temp 97.7°F | Resp 14 | Ht 65.0 in | Wt 173.0 lb

## 2015-06-02 DIAGNOSIS — E038 Other specified hypothyroidism: Secondary | ICD-10-CM

## 2015-06-02 DIAGNOSIS — Z8551 Personal history of malignant neoplasm of bladder: Secondary | ICD-10-CM | POA: Diagnosis not present

## 2015-06-02 DIAGNOSIS — I10 Essential (primary) hypertension: Secondary | ICD-10-CM | POA: Diagnosis not present

## 2015-06-02 DIAGNOSIS — R109 Unspecified abdominal pain: Secondary | ICD-10-CM | POA: Diagnosis not present

## 2015-06-02 DIAGNOSIS — K6289 Other specified diseases of anus and rectum: Secondary | ICD-10-CM

## 2015-06-02 DIAGNOSIS — K626 Ulcer of anus and rectum: Secondary | ICD-10-CM

## 2015-06-02 DIAGNOSIS — G8929 Other chronic pain: Secondary | ICD-10-CM

## 2015-06-02 DIAGNOSIS — A09 Infectious gastroenteritis and colitis, unspecified: Secondary | ICD-10-CM

## 2015-06-02 LAB — COMPREHENSIVE METABOLIC PANEL
ALBUMIN: 4.4 g/dL (ref 3.6–5.1)
ALT: 39 U/L — ABNORMAL HIGH (ref 6–29)
AST: 37 U/L — ABNORMAL HIGH (ref 10–35)
Alkaline Phosphatase: 86 U/L (ref 33–130)
BUN: 14 mg/dL (ref 7–25)
CALCIUM: 8.4 mg/dL — AB (ref 8.6–10.4)
CHLORIDE: 101 mmol/L (ref 98–110)
CO2: 26 mmol/L (ref 20–31)
Creat: 1.01 mg/dL — ABNORMAL HIGH (ref 0.50–0.99)
Glucose, Bld: 95 mg/dL (ref 70–99)
POTASSIUM: 3.3 mmol/L — AB (ref 3.5–5.3)
Sodium: 139 mmol/L (ref 135–146)
TOTAL PROTEIN: 7.6 g/dL (ref 6.1–8.1)
Total Bilirubin: 0.8 mg/dL (ref 0.2–1.2)

## 2015-06-02 LAB — CBC WITH DIFFERENTIAL/PLATELET
BASOS ABS: 0 10*3/uL (ref 0.0–0.1)
Basophils Relative: 0 % (ref 0–1)
EOS ABS: 0.1 10*3/uL (ref 0.0–0.7)
EOS PCT: 1 % (ref 0–5)
HCT: 38 % (ref 36.0–46.0)
Hemoglobin: 12.4 g/dL (ref 12.0–15.0)
LYMPHS ABS: 1.2 10*3/uL (ref 0.7–4.0)
Lymphocytes Relative: 18 % (ref 12–46)
MCH: 25.9 pg — AB (ref 26.0–34.0)
MCHC: 32.6 g/dL (ref 30.0–36.0)
MCV: 79.5 fL (ref 78.0–100.0)
MPV: 9.3 fL (ref 8.6–12.4)
Monocytes Absolute: 0.5 10*3/uL (ref 0.1–1.0)
Monocytes Relative: 7 % (ref 3–12)
Neutro Abs: 4.9 10*3/uL (ref 1.7–7.7)
Neutrophils Relative %: 74 % (ref 43–77)
PLATELETS: 288 10*3/uL (ref 150–400)
RBC: 4.78 MIL/uL (ref 3.87–5.11)
RDW: 17.6 % — AB (ref 11.5–15.5)
WBC: 6.6 10*3/uL (ref 4.0–10.5)

## 2015-06-02 LAB — TSH: TSH: 63.775 u[IU]/mL — ABNORMAL HIGH (ref 0.350–4.500)

## 2015-06-02 LAB — T3, FREE: T3, Free: 1.7 pg/mL — ABNORMAL LOW (ref 2.3–4.2)

## 2015-06-02 LAB — T4, FREE: FREE T4: 0.84 ng/dL (ref 0.80–1.80)

## 2015-06-02 MED ORDER — MORPHINE SULFATE ER 15 MG PO TBCR: 15.0000 mg | EXTENDED_RELEASE_TABLET | Freq: Two times a day (BID) | ORAL | Status: DC

## 2015-06-02 MED ORDER — OXYCODONE HCL 5 MG PO TABS: 5.0000 mg | ORAL_TABLET | Freq: Four times a day (QID) | ORAL | Status: DC | PRN

## 2015-06-02 MED ORDER — ALIGN 4 MG PO CAPS: ORAL_CAPSULE | ORAL | Status: DC

## 2015-06-02 NOTE — Progress Notes (Signed)
Patient ID: [name], female   DOB: [birth year], 64 y.o.   MRN: [id number]    Subjective:    Patient ID: [name], female    DOB: 1951/05/21, 64 y.o.   MRN: [id number]  Patient presents for Hospital F/U  here for hospital follow-up. She was admitted to the [facility] back in October she was there for one week. She was very secondary to ongoing rectal pain and rectal bleeding. She states that an MRI was done which showed that she had some type of mass between her urostomy in her rectal area she had follow-up at [facility] last week and they reviewed the MRI did not find any masses. She states no one can figure out why she is having some rectal pain. I referred her to GI greater than 6 months ago for the same problem. She's had multiple anus as well as colonoscopy with biopsies that were negative. She states that she has hemorrhoids but no other lesions.. She is using multiple types of diaper creams and rectal creams for pain without relief, her abdomen will cramp after the BM. It is noted that she was treated for Shigella when she was in [place] think that she may get up when she was camping recently. She still takes Imodium every few days to help with her chronic diarrhea. Dialaudid no longer help her pain and therefore she was transitioned to morphine sulfate twice a day and oxycodone for breakthrough pain.  I reviewed recent GI note from [facility],    Review Of Systems:  GEN- denies fatigue, fever, weight loss,weakness, recent illness HEENT- denies eye drainage, change in vision, nasal discharge, CVS- denies chest pain, palpitations RESP- denies SOB, cough, wheeze ABD- denies N/V, change in stools, +abd pain GU- denies dysuria, hematuria, dribbling, incontinence MSK- denies joint pain, muscle aches, injury Neuro- denies headache, dizziness, syncope, seizure activity       Objective:    BP 128/72 mmHg  Pulse 68  Temp(Src) 97.7 F (36.5 C) (Oral)   Resp 14  Ht 5\' 5"  (1.651 m)  Wt 173 lb (78.472 kg)  BMI 28.79 kg/m2 GEN- NAD, alert and oriented x3, + weight gain HEENT- PERRL, EOMI, non injected sclera, pink conjunctiva, MMM, oropharynx clear Neck- Supple, no thyromegaly CVS- RRR, no murmur RESP-CTAB ABD-NABS,soft,NT,ND, urostomy in tact Rectum- normal tone, external skin tags, skin irritation with mild bleeding right perirectal         Assessment & Plan:      Problem List Items Addressed This Visit    None      Note: This dictation was prepared with Dragon dictation along with smaller phrase technology. Any transcriptional errors that result from this process are unintentional.

## 2015-06-02 NOTE — Patient Instructions (Addendum)
Return a stool study  Take pain medication as prescribed We will call with  Lab results  Referral to new specialist Release of records- [facility] F/U pending results

## 2015-06-02 NOTE — Assessment & Plan Note (Signed)
Unclear cause she has been treated for hemorroids, ulceration, she has chronic diarrhea, ? Infectious diarrhea. Recheck stool studies. Refer for second opion, though I was quite frank with patient I am not sure what else can be offered

## 2015-06-04 ENCOUNTER — Encounter: Payer: Self-pay | Admitting: Family Medicine

## 2015-06-04 NOTE — Assessment & Plan Note (Signed)
Follow up urology at [facility], has not had any recent issues

## 2015-06-04 NOTE — Assessment & Plan Note (Signed)
Well controlled, no change to meds 

## 2015-06-04 NOTE — Assessment & Plan Note (Signed)
Check thyroid function test, continue synthroid

## 2015-06-05 ENCOUNTER — Other Ambulatory Visit: Payer: Self-pay | Admitting: *Deleted

## 2015-06-05 MED ORDER — LEVOTHYROXINE SODIUM 88 MCG PO TABS: 88.0000 ug | ORAL_TABLET | Freq: Every day | ORAL | Status: DC

## 2015-06-07 ENCOUNTER — Other Ambulatory Visit: Payer: Medicare Other

## 2015-06-07 DIAGNOSIS — A09 Infectious gastroenteritis and colitis, unspecified: Secondary | ICD-10-CM | POA: Diagnosis not present

## 2015-06-07 NOTE — Addendum Note (Signed)
Addended by: Sheral Flow on: 06/07/2015 04:13 PM   Modules accepted: Orders

## 2015-06-08 ENCOUNTER — Encounter: Payer: Self-pay | Admitting: Family Medicine

## 2015-06-08 LAB — OVA AND PARASITE EXAMINATION: OP: NONE SEEN

## 2015-06-11 LAB — STOOL CULTURE

## 2015-06-27 DIAGNOSIS — Z936 Other artificial openings of urinary tract status: Secondary | ICD-10-CM | POA: Diagnosis not present

## 2015-06-27 DIAGNOSIS — C679 Malignant neoplasm of bladder, unspecified: Secondary | ICD-10-CM | POA: Diagnosis not present

## 2015-07-04 ENCOUNTER — Telehealth: Payer: Self-pay | Admitting: *Deleted

## 2015-07-04 MED ORDER — OXYCODONE HCL 5 MG PO TABS: 5.0000 mg | ORAL_TABLET | Freq: Four times a day (QID) | ORAL | Status: DC | PRN

## 2015-07-04 MED ORDER — MORPHINE SULFATE ER 15 MG PO TBCR: 15.0000 mg | EXTENDED_RELEASE_TABLET | Freq: Two times a day (BID) | ORAL | Status: DC

## 2015-07-04 NOTE — Telephone Encounter (Signed)
Prescription printed and patient made aware to come to office to pick up after 3pm on 07/04/2015 per VM.

## 2015-07-04 NOTE — Telephone Encounter (Signed)
Okay to refill? 

## 2015-07-04 NOTE — Telephone Encounter (Signed)
Received call from patient daughter Karie Kirks.   Requested refill on Oxycodone and MS Contin.   Ok to refill??  Last office visit 06/02/2015.  Last refill 06/02/2015.

## 2015-07-06 ENCOUNTER — Ambulatory Visit
Admission: RE | Admit: 2015-07-06 | Discharge: 2015-07-06 | Disposition: A | Discharge status: Home / Self Care | Payer: Medicare Other | Source: Ambulatory Visit

## 2015-07-06 DIAGNOSIS — Z1231 Encounter for screening mammogram for malignant neoplasm of breast: Secondary | ICD-10-CM | POA: Diagnosis not present

## 2015-07-06 DIAGNOSIS — Z803 Family history of malignant neoplasm of breast: Secondary | ICD-10-CM

## 2015-07-12 DIAGNOSIS — C679 Malignant neoplasm of bladder, unspecified: Secondary | ICD-10-CM | POA: Diagnosis not present

## 2015-07-18 DIAGNOSIS — K626 Ulcer of anus and rectum: Secondary | ICD-10-CM | POA: Diagnosis not present

## 2015-08-01 ENCOUNTER — Encounter: Payer: Self-pay | Admitting: Family Medicine

## 2015-08-01 ENCOUNTER — Ambulatory Visit (INDEPENDENT_AMBULATORY_CARE_PROVIDER_SITE_OTHER): Payer: Medicare Other | Admitting: Family Medicine

## 2015-08-01 VITALS — BP 140/78 | HR 68 | Temp 97.6°F | Resp 16 | Ht 65.0 in | Wt 180.0 lb

## 2015-08-01 DIAGNOSIS — E038 Other specified hypothyroidism: Secondary | ICD-10-CM | POA: Diagnosis not present

## 2015-08-01 DIAGNOSIS — M7989 Other specified soft tissue disorders: Secondary | ICD-10-CM

## 2015-08-01 DIAGNOSIS — K626 Ulcer of anus and rectum: Secondary | ICD-10-CM | POA: Diagnosis not present

## 2015-08-01 MED ORDER — MORPHINE SULFATE ER 15 MG PO TBCR: 15.0000 mg | EXTENDED_RELEASE_TABLET | Freq: Two times a day (BID) | ORAL | Status: DC

## 2015-08-01 MED ORDER — FUROSEMIDE 40 MG PO TABS: 40.0000 mg | ORAL_TABLET | Freq: Two times a day (BID) | ORAL | Status: DC

## 2015-08-01 MED ORDER — OXYCODONE HCL 5 MG PO TABS: 5.0000 mg | ORAL_TABLET | Freq: Four times a day (QID) | ORAL | Status: DC | PRN

## 2015-08-01 NOTE — Patient Instructions (Addendum)
For 3 Days prior to the surgery, do not consume any fruits, vegetables or nuts. You may have fruit or vegetable juice.   On the day prior to your surgery:  Eat a regular breakfast. Starting at lunch, only consume clear liquids for the rest of day. At noon, take two over the counter Dulcolax laxative pills by mouth. Start bowel prep in the afternoon.   Bowel prep: Purchase TWO 32oz bottles of gatorade and ONE 238gram bottle of miralax. Split the bottle of miralax equally between the TWO bottles of gatorade. Consume the first bottle of mixture in the afternoon. Consume the second bottle 2 hours later.   Please call with any questions or concerns. [phone] - Surgeon   Please take your last dose of Eliquis on 4 for 4 days   ------------ For the swelling increase lasix to 40mg  twice a day for a week, then 1 a day  We will call with lab results  Go ahead ahead and reschedule surgery  F/U 2 weeks

## 2015-08-01 NOTE — Assessment & Plan Note (Signed)
He is not had a problem with leg swelling until she went off of her diuretic. She did have an echocardiogram done in 2015 that was normal no other signs of heart failure. I'm to restart her Lasix she is to follow-up in 2 weeks for recheck on her fluid status. Discussed the importance of her monitoring her diet she is defintely eating too many fatty foods which is affecting her cholesterol and her fatty liver disease as well as to many salty foods.

## 2015-08-01 NOTE — Assessment & Plan Note (Signed)
The patient with her ongoing pain and now her fecal incontinence I recommend that she proceed with the scope to be done in the operating room. She is close call and reschedule with her surgeon I did give her the instructions that they have listed on her discharge summary after the visit.

## 2015-08-01 NOTE — Assessment & Plan Note (Signed)
Recheck thyroid function studies discussed the importance of taking her medication on a regular basis and also taking this before any other medications

## 2015-08-01 NOTE — Progress Notes (Signed)
Patient ID: [name], female   DOB: Mar 01, 1951, 65 y.o.   MRN: [id number]    Subjective:    Patient ID: [name], female    DOB: 1951-06-22, 65 y.o.   MRN: [id number]  Patient presents for F/U and BLE Edema here for follow-up. She was referred to gastroenterology awake forced for persistent rectal pain and bleeding she's also had some fecal incontinence. They were setting her up to do some type of scope in the operating room and she became afraid and did not want to have a scope done. She states that they showed her some pain in the exam room and therefore she'll proceed with it. She has canceled her preop and her surgery. She then tells me that she wanted to do surgery because it was during the same of that her mother died and she is superstitious that she will end up in the ICU died from having the scope done. It appears that they are concerned that she has an unhealed rectal ulcer that they're trying to examine. She does request a refill on her pain medications.  She also states that she is been having swelling. Initially states she was taking her medications then changed her story and states that she had been off of her water pills for quite a time to try to restart them recently. She's only taken once a day for the past couple days but is now run out. She is also been eating all different types of food including fried meats and Bologna, salty foods on a regular basis and since she was cleared to restart her normal diet. Her weight is 280 pounds she was 136 pounds a year ago. Her thyroid function was also significantly off 8 weeks ago her TSH was up to 63. I did increase her Synthroid to 88 g. She is due for recheck on this today.    Review Of Systems: per above   GEN- denies fatigue, fever, weight loss,weakness, recent illness HEENT- denies eye drainage, change in vision, nasal discharge, CVS- denies chest pain, palpitations RESP- denies SOB, cough, wheeze ABD- denies N/V,  change in stools, abd pain GU- denies dysuria, hematuria, dribbling, incontinence MSK- denies joint pain, muscle aches, injury Neuro- denies headache, dizziness, syncope, seizure activity       Objective:    BP 140/78 mmHg  Pulse 68  Temp(Src) 97.6 F (36.4 C) (Oral)  Resp 16  Ht 5\' 5"  (1.651 m)  Wt 180 lb (81.647 kg)  BMI 29.95 kg/m2 GEN- NAD, alert and oriented x3, + weight gain HEENT- PERRL, EOMI, non injected sclera, pink conjunctiva, MMM, oropharynx clear Neck- Supple, no thyromegaly CVS- RRR, no murmur RESP-CTAB ABD-NABS,soft,NT,ND, urostomy in tact Ext- 1+ edema to shins         Assessment & Plan:      Problem List Items Addressed This Visit    Rectal ulcer    The patient with her ongoing pain and now her fecal incontinence I recommend that she proceed with the scope to be done in the operating room. She is close call and reschedule with her surgeon I did give her the instructions that they have listed on her discharge summary after the visit.      Leg swelling    He is not had a problem with leg swelling until she went off of her diuretic. She did have an echocardiogram done in 2015 that was normal no other signs of heart failure. I'm to restart her Lasix she  is to follow-up in 2 weeks for recheck on her fluid status. Discussed the importance of her monitoring her diet she is defintely eating too many fatty foods which is affecting her cholesterol and her fatty liver disease as well as to many salty foods.      Relevant Orders   CBC with Differential/Platelet   Comprehensive metabolic panel   Hypothyroidism - Primary    Recheck thyroid function studies discussed the importance of taking her medication on a regular basis and also taking this before any other medications      Relevant Orders   TSH   T3, free   T4, free      Note: This dictation was prepared with Dragon dictation along with smaller phrase technology. Any transcriptional errors that result  from this process are unintentional.

## 2015-08-02 LAB — CBC WITH DIFFERENTIAL/PLATELET
BASOS PCT: 0 % (ref 0–1)
Basophils Absolute: 0 10*3/uL (ref 0.0–0.1)
EOS ABS: 0.2 10*3/uL (ref 0.0–0.7)
Eosinophils Relative: 2 % (ref 0–5)
HCT: 38.4 % (ref 36.0–46.0)
Hemoglobin: 12.4 g/dL (ref 12.0–15.0)
Lymphocytes Relative: 22 % (ref 12–46)
Lymphs Abs: 2 10*3/uL (ref 0.7–4.0)
MCH: 25.7 pg — AB (ref 26.0–34.0)
MCHC: 32.3 g/dL (ref 30.0–36.0)
MCV: 79.7 fL (ref 78.0–100.0)
MONO ABS: 0.5 10*3/uL (ref 0.1–1.0)
MONOS PCT: 5 % (ref 3–12)
MPV: 10 fL (ref 8.6–12.4)
NEUTROS PCT: 71 % (ref 43–77)
Neutro Abs: 6.4 10*3/uL (ref 1.7–7.7)
PLATELETS: 330 10*3/uL (ref 150–400)
RBC: 4.82 MIL/uL (ref 3.87–5.11)
RDW: 17 % — ABNORMAL HIGH (ref 11.5–15.5)
WBC: 9 10*3/uL (ref 4.0–10.5)

## 2015-08-02 LAB — COMPREHENSIVE METABOLIC PANEL
ALT: 18 U/L (ref 6–29)
AST: 17 U/L (ref 10–35)
Albumin: 3.7 g/dL (ref 3.6–5.1)
Alkaline Phosphatase: 75 U/L (ref 33–130)
BUN: 15 mg/dL (ref 7–25)
CHLORIDE: 107 mmol/L (ref 98–110)
CO2: 24 mmol/L (ref 20–31)
Calcium: 9.2 mg/dL (ref 8.6–10.4)
Creat: 1.03 mg/dL — ABNORMAL HIGH (ref 0.50–0.99)
GLUCOSE: 81 mg/dL (ref 70–99)
POTASSIUM: 3.4 mmol/L — AB (ref 3.5–5.3)
SODIUM: 139 mmol/L (ref 135–146)
Total Bilirubin: 0.6 mg/dL (ref 0.2–1.2)
Total Protein: 7.3 g/dL (ref 6.1–8.1)

## 2015-08-02 LAB — T3, FREE: T3 FREE: 1.9 pg/mL — AB (ref 2.3–4.2)

## 2015-08-02 LAB — TSH: TSH: 28.178 u[IU]/mL — ABNORMAL HIGH (ref 0.350–4.500)

## 2015-08-02 LAB — T4, FREE: FREE T4: 1.16 ng/dL (ref 0.80–1.80)

## 2015-08-03 ENCOUNTER — Other Ambulatory Visit: Payer: Self-pay | Admitting: Family Medicine

## 2015-08-03 NOTE — Telephone Encounter (Signed)
Medication refilled per protocol. 

## 2015-08-04 ENCOUNTER — Other Ambulatory Visit: Payer: Self-pay | Admitting: *Deleted

## 2015-08-04 MED ORDER — LEVOTHYROXINE SODIUM 100 MCG PO TABS: 100.0000 ug | ORAL_TABLET | Freq: Every day | ORAL | Status: DC

## 2015-08-04 MED ORDER — POTASSIUM CHLORIDE ER 10 MEQ PO TBCR: 10.0000 meq | EXTENDED_RELEASE_TABLET | Freq: Every day | ORAL | Status: DC

## 2015-08-14 DIAGNOSIS — C679 Malignant neoplasm of bladder, unspecified: Secondary | ICD-10-CM | POA: Diagnosis not present

## 2015-08-14 DIAGNOSIS — Z936 Other artificial openings of urinary tract status: Secondary | ICD-10-CM | POA: Diagnosis not present

## 2015-08-15 ENCOUNTER — Ambulatory Visit (INDEPENDENT_AMBULATORY_CARE_PROVIDER_SITE_OTHER): Payer: Medicare Other | Admitting: Family Medicine

## 2015-08-15 ENCOUNTER — Encounter: Payer: Self-pay | Admitting: Family Medicine

## 2015-08-15 VITALS — BP 138/74 | HR 82 | Temp 98.6°F | Resp 16 | Ht 65.0 in | Wt 172.0 lb

## 2015-08-15 DIAGNOSIS — R8290 Unspecified abnormal findings in urine: Secondary | ICD-10-CM | POA: Diagnosis not present

## 2015-08-15 DIAGNOSIS — R300 Dysuria: Secondary | ICD-10-CM | POA: Diagnosis not present

## 2015-08-15 DIAGNOSIS — K529 Noninfective gastroenteritis and colitis, unspecified: Secondary | ICD-10-CM | POA: Diagnosis not present

## 2015-08-15 LAB — URINALYSIS, MICROSCOPIC ONLY
Crystals: NONE SEEN [HPF]
Yeast: NONE SEEN [HPF]

## 2015-08-15 LAB — URINALYSIS, ROUTINE W REFLEX MICROSCOPIC
Bilirubin Urine: NEGATIVE
Glucose, UA: NEGATIVE
Ketones, ur: NEGATIVE
Nitrite: NEGATIVE
Specific Gravity, Urine: 1.02 (ref 1.001–1.035)
pH: 6 (ref 5.0–8.0)

## 2015-08-15 NOTE — Patient Instructions (Addendum)
F/U 4 weeks for thyroid labs HOLD LASIX  Until diarrhea goes away  Then restart the lasix 40mg  once a day  Use the imodium  F/U 4 months for OV

## 2015-08-15 NOTE — Progress Notes (Signed)
Patient ID: [name], female   DOB: 31-Mar-1951, 65 y.o.   MRN: [id number]   Subjective:    Patient ID: [name], female    DOB: 1950/07/08, 65 y.o.   MRN: [id number]  Patient presents for 2 week F/U; GI Upset; and Dysuria  patient here for interim follow-up on her peripheral edema she was seen 2 weeks ago at that time I increased her Lasix to 40 mg twice a day for a week ,  She was supposed to decrease down to 40 mg daily but she has still been taking it twice a day.She had not been taking on a regular basis and had more fluid retention along with dietary indiscretions causing retention. Her leg swelling is now resolved. Unfortunately for the past today she is has significant diarrhea. She was with her niece who had gastroenteritis and she began having abdominal cramping and diarrhea she states up to 30-40 times a day she denies any blood in the stool. She also noticed some darkening of her urine in her urostomy. She denies any fever no nausea or vomiting she does not feel bad just having the diarrhea. She did take Imodium which started to take effect yesterday and today she is only had 3 small bowel movements.   She did have 2-D echocardiogram done  August 20 15 which was normal no evidence of heart failure   her labs were done on her thyroid medication was increase to 100 g once a day     Review Of Systems:  GEN- denies fatigue, fever, weight loss,weakness, recent illness HEENT- denies eye drainage, change in vision, nasal discharge, CVS- denies chest pain, palpitations RESP- denies SOB, cough, wheeze ABD- denies N/V, +change in stools,+ abd pain Neuro- denies headache, dizziness, syncope, seizure activity       Objective:    BP 138/74 mmHg  Pulse 82  Temp(Src) 98.6 F (37 C) (Oral)  Resp 16  Ht 5\' 5"  (1.651 m)  Wt 172 lb (78.019 kg)  BMI 28.62 kg/m2 GEN- NAD, alert and oriented x3, + weight gain HEENT- PERRL, EOMI, non injected sclera, pink conjunctiva, MMM,  oropharynx clear CVS- RRR, no murmur RESP-CTAB ABD-NABS,soft,NT,ND, urostomy in tact (Urine a little dark) Ext- No edema        Assessment & Plan:      Problem List Items Addressed This Visit    None    Visit Diagnoses    Malodorous urine    -  Primary    UA shows dehyration, no overt infection, she is colonized so will not treat, no other systemic symptoms fever, chills, N./V    Relevant Orders    Urinalysis, Routine w reflex microscopic (not at [facility]) (Completed)    Gastroenteritis        positive exposire, however with her GI history, not suprising the multiple BM. Continue immodium, already improving, push fluids, Hold lasix for next 3-4 days until diarrhea resolves    Relevant Orders    Basic metabolic panel    CBC with Differential/Platelet    Urine culture       Note: This dictation was prepared with Dragon dictation along with smaller phrase technology. Any transcriptional errors that result from this process are unintentional.

## 2015-08-16 LAB — CBC WITH DIFFERENTIAL/PLATELET
Basophils Absolute: 0 10*3/uL (ref 0.0–0.1)
Basophils Relative: 0 % (ref 0–1)
EOS ABS: 0.1 10*3/uL (ref 0.0–0.7)
Eosinophils Relative: 2 % (ref 0–5)
HCT: 38.3 % (ref 36.0–46.0)
HEMOGLOBIN: 12.5 g/dL (ref 12.0–15.0)
LYMPHS ABS: 1.3 10*3/uL (ref 0.7–4.0)
Lymphocytes Relative: 23 % (ref 12–46)
MCH: 25.6 pg — AB (ref 26.0–34.0)
MCHC: 32.6 g/dL (ref 30.0–36.0)
MCV: 78.5 fL (ref 78.0–100.0)
MONO ABS: 0.6 10*3/uL (ref 0.1–1.0)
MONOS PCT: 11 % (ref 3–12)
MPV: 10.6 fL (ref 8.6–12.4)
NEUTROS PCT: 64 % (ref 43–77)
Neutro Abs: 3.6 10*3/uL (ref 1.7–7.7)
PLATELETS: 284 10*3/uL (ref 150–400)
RBC: 4.88 MIL/uL (ref 3.87–5.11)
RDW: 16.9 % — ABNORMAL HIGH (ref 11.5–15.5)
WBC: 5.7 10*3/uL (ref 4.0–10.5)

## 2015-08-16 LAB — BASIC METABOLIC PANEL
BUN: 15 mg/dL (ref 7–25)
CO2: 24 mmol/L (ref 20–31)
Calcium: 8.8 mg/dL (ref 8.6–10.4)
Chloride: 105 mmol/L (ref 98–110)
Creat: 1.19 mg/dL — ABNORMAL HIGH (ref 0.50–0.99)
GLUCOSE: 80 mg/dL (ref 70–99)
POTASSIUM: 3.9 mmol/L (ref 3.5–5.3)
SODIUM: 137 mmol/L (ref 135–146)

## 2015-08-18 ENCOUNTER — Other Ambulatory Visit: Payer: Self-pay | Admitting: *Deleted

## 2015-08-18 LAB — URINE CULTURE: Colony Count: >=100000

## 2015-08-18 MED ORDER — CIPROFLOXACIN HCL 500 MG PO TABS: 500.0000 mg | ORAL_TABLET | Freq: Two times a day (BID) | ORAL | Status: DC

## 2015-09-01 DIAGNOSIS — C679 Malignant neoplasm of bladder, unspecified: Secondary | ICD-10-CM | POA: Diagnosis not present

## 2015-09-01 DIAGNOSIS — Z936 Other artificial openings of urinary tract status: Secondary | ICD-10-CM | POA: Diagnosis not present

## 2015-09-05 ENCOUNTER — Telehealth: Payer: Self-pay | Admitting: *Deleted

## 2015-09-05 MED ORDER — OXYCODONE HCL 5 MG PO TABS: 5.0000 mg | ORAL_TABLET | Freq: Four times a day (QID) | ORAL | Status: DC | PRN

## 2015-09-05 NOTE — Telephone Encounter (Signed)
Pt calling needing refill on OXYCODONE 5mg    Lrf:08/01/15  Lov:08/15/15  ?ok to refill

## 2015-09-05 NOTE — Telephone Encounter (Signed)
Script printed ready for provider signautre

## 2015-09-05 NOTE — Telephone Encounter (Signed)
Okay to refill? 

## 2015-09-08 ENCOUNTER — Telehealth: Payer: Self-pay | Admitting: Family Medicine

## 2015-09-08 DIAGNOSIS — R000 Tachycardia, unspecified: Secondary | ICD-10-CM | POA: Diagnosis not present

## 2015-09-08 DIAGNOSIS — Z885 Allergy status to narcotic agent status: Secondary | ICD-10-CM | POA: Diagnosis not present

## 2015-09-08 DIAGNOSIS — R002 Palpitations: Secondary | ICD-10-CM | POA: Diagnosis not present

## 2015-09-08 DIAGNOSIS — Z881 Allergy status to other antibiotic agents status: Secondary | ICD-10-CM | POA: Diagnosis not present

## 2015-09-08 DIAGNOSIS — K219 Gastro-esophageal reflux disease without esophagitis: Secondary | ICD-10-CM | POA: Diagnosis not present

## 2015-09-08 DIAGNOSIS — Z880 Allergy status to penicillin: Secondary | ICD-10-CM | POA: Diagnosis not present

## 2015-09-08 DIAGNOSIS — E7800 Pure hypercholesterolemia, unspecified: Secondary | ICD-10-CM | POA: Diagnosis not present

## 2015-09-08 DIAGNOSIS — G4733 Obstructive sleep apnea (adult) (pediatric): Secondary | ICD-10-CM | POA: Diagnosis not present

## 2015-09-08 DIAGNOSIS — Z882 Allergy status to sulfonamides status: Secondary | ICD-10-CM | POA: Diagnosis not present

## 2015-09-08 DIAGNOSIS — Z888 Allergy status to other drugs, medicaments and biological substances status: Secondary | ICD-10-CM | POA: Diagnosis not present

## 2015-09-08 DIAGNOSIS — Z906 Acquired absence of other parts of urinary tract: Secondary | ICD-10-CM | POA: Diagnosis not present

## 2015-09-08 DIAGNOSIS — Z8551 Personal history of malignant neoplasm of bladder: Secondary | ICD-10-CM | POA: Diagnosis not present

## 2015-09-08 DIAGNOSIS — K6289 Other specified diseases of anus and rectum: Secondary | ICD-10-CM | POA: Diagnosis not present

## 2015-09-08 DIAGNOSIS — Z9119 Patient's noncompliance with other medical treatment and regimen: Secondary | ICD-10-CM | POA: Diagnosis not present

## 2015-09-08 DIAGNOSIS — Z936 Other artificial openings of urinary tract status: Secondary | ICD-10-CM | POA: Diagnosis not present

## 2015-09-08 DIAGNOSIS — Z87891 Personal history of nicotine dependence: Secondary | ICD-10-CM | POA: Diagnosis not present

## 2015-09-08 DIAGNOSIS — Z7901 Long term (current) use of anticoagulants: Secondary | ICD-10-CM | POA: Diagnosis not present

## 2015-09-08 DIAGNOSIS — E039 Hypothyroidism, unspecified: Secondary | ICD-10-CM | POA: Diagnosis not present

## 2015-09-08 DIAGNOSIS — Z79899 Other long term (current) drug therapy: Secondary | ICD-10-CM | POA: Diagnosis not present

## 2015-09-08 DIAGNOSIS — K589 Irritable bowel syndrome without diarrhea: Secondary | ICD-10-CM | POA: Diagnosis not present

## 2015-09-08 DIAGNOSIS — Z86718 Personal history of other venous thrombosis and embolism: Secondary | ICD-10-CM | POA: Diagnosis not present

## 2015-09-08 NOTE — Telephone Encounter (Signed)
Pt has some concerns about her procedure this coming Friday and she needs to talk to Dr. Buelah Manis before she will go to the appointment. Her concerns were unclear. [phone]

## 2015-09-11 NOTE — Telephone Encounter (Signed)
Call placed to patient.   Reports that she has procedure scheduled for Friday that sounds like a colonoscopy. (I am not completely sure what she is having done)  Reports that she is scheduled to go to [facility] for procedure, but she is concerned about having procedure done there. States that she had been in [facility] before and ended up on life support.   Advised that if she has concerns, she should contact MD that is scheduling procedure.   States that she would like to have discussion with MD to ease her mind about Donnie Aho that MD is out of office today, but concerns will be routed to MD.

## 2015-09-12 NOTE — Telephone Encounter (Signed)
Spoke with pt, she was concerned about her procedure but she had complication in 98' and tried to sue the hospital, she felt she would not get good care. Advised this was not the case and this also a different set of doctors performing her surgery. She can discuss her concerns with them. But seems she is okay now.

## 2015-09-15 DIAGNOSIS — Z9119 Patient's noncompliance with other medical treatment and regimen: Secondary | ICD-10-CM | POA: Diagnosis not present

## 2015-09-15 DIAGNOSIS — E039 Hypothyroidism, unspecified: Secondary | ICD-10-CM | POA: Diagnosis not present

## 2015-09-15 DIAGNOSIS — K6289 Other specified diseases of anus and rectum: Secondary | ICD-10-CM | POA: Diagnosis not present

## 2015-09-15 DIAGNOSIS — Z86718 Personal history of other venous thrombosis and embolism: Secondary | ICD-10-CM | POA: Diagnosis not present

## 2015-09-15 DIAGNOSIS — K626 Ulcer of anus and rectum: Secondary | ICD-10-CM | POA: Diagnosis not present

## 2015-09-15 DIAGNOSIS — Z906 Acquired absence of other parts of urinary tract: Secondary | ICD-10-CM | POA: Diagnosis not present

## 2015-09-15 DIAGNOSIS — Z79899 Other long term (current) drug therapy: Secondary | ICD-10-CM | POA: Diagnosis not present

## 2015-09-15 DIAGNOSIS — R002 Palpitations: Secondary | ICD-10-CM | POA: Diagnosis not present

## 2015-09-15 DIAGNOSIS — E7800 Pure hypercholesterolemia, unspecified: Secondary | ICD-10-CM | POA: Diagnosis not present

## 2015-09-15 DIAGNOSIS — Z888 Allergy status to other drugs, medicaments and biological substances status: Secondary | ICD-10-CM | POA: Diagnosis not present

## 2015-09-15 DIAGNOSIS — K219 Gastro-esophageal reflux disease without esophagitis: Secondary | ICD-10-CM | POA: Diagnosis not present

## 2015-09-15 DIAGNOSIS — Z882 Allergy status to sulfonamides status: Secondary | ICD-10-CM | POA: Diagnosis not present

## 2015-09-15 DIAGNOSIS — Z87891 Personal history of nicotine dependence: Secondary | ICD-10-CM | POA: Diagnosis not present

## 2015-09-15 DIAGNOSIS — G4733 Obstructive sleep apnea (adult) (pediatric): Secondary | ICD-10-CM | POA: Diagnosis not present

## 2015-09-15 DIAGNOSIS — Z885 Allergy status to narcotic agent status: Secondary | ICD-10-CM | POA: Diagnosis not present

## 2015-09-15 DIAGNOSIS — Z936 Other artificial openings of urinary tract status: Secondary | ICD-10-CM | POA: Diagnosis not present

## 2015-09-15 DIAGNOSIS — K589 Irritable bowel syndrome without diarrhea: Secondary | ICD-10-CM | POA: Diagnosis not present

## 2015-09-15 DIAGNOSIS — Z8551 Personal history of malignant neoplasm of bladder: Secondary | ICD-10-CM | POA: Diagnosis not present

## 2015-09-15 DIAGNOSIS — Z881 Allergy status to other antibiotic agents status: Secondary | ICD-10-CM | POA: Diagnosis not present

## 2015-09-15 DIAGNOSIS — Z7901 Long term (current) use of anticoagulants: Secondary | ICD-10-CM | POA: Diagnosis not present

## 2015-09-15 DIAGNOSIS — Z880 Allergy status to penicillin: Secondary | ICD-10-CM | POA: Diagnosis not present

## 2015-09-19 DIAGNOSIS — C679 Malignant neoplasm of bladder, unspecified: Secondary | ICD-10-CM | POA: Diagnosis not present

## 2015-09-19 DIAGNOSIS — Z936 Other artificial openings of urinary tract status: Secondary | ICD-10-CM | POA: Diagnosis not present

## 2015-09-26 ENCOUNTER — Ambulatory Visit (INDEPENDENT_AMBULATORY_CARE_PROVIDER_SITE_OTHER): Payer: Medicare Other | Admitting: Family Medicine

## 2015-09-26 ENCOUNTER — Encounter: Payer: Self-pay | Admitting: Family Medicine

## 2015-09-26 VITALS — BP 118/74 | HR 62 | Temp 98.3°F | Resp 14 | Ht 65.0 in | Wt 176.0 lb

## 2015-09-26 DIAGNOSIS — R079 Chest pain, unspecified: Secondary | ICD-10-CM | POA: Diagnosis not present

## 2015-09-26 DIAGNOSIS — K219 Gastro-esophageal reflux disease without esophagitis: Secondary | ICD-10-CM

## 2015-09-26 MED ORDER — MORPHINE SULFATE ER 15 MG PO TBCR: 15.0000 mg | EXTENDED_RELEASE_TABLET | Freq: Two times a day (BID) | ORAL | Status: DC

## 2015-09-26 MED ORDER — OXYCODONE HCL 5 MG PO TABS: 5.0000 mg | ORAL_TABLET | Freq: Four times a day (QID) | ORAL | Status: DC | PRN

## 2015-09-26 NOTE — Patient Instructions (Signed)
Take the zantac at bedtime Continue current medications F/U 4 months

## 2015-09-26 NOTE — Progress Notes (Signed)
Patient ID: [name], female   DOB: 01/06/1951, 65 y.o.   MRN: [id number]   Subjective:    Patient ID: [name], female    DOB: 25-Jul-1950, 65 y.o.   MRN: [id number]  Patient presents for Gas Pain Patient here with gas pains for the past few days. She states that she's had some chest discomfort where she feels it going up the center of her chest. She has history of acid reflux. She's had multiple intestinal issues over the years see previous notes. She's been having rectal pain constantly for the past year or so she recently had another sigmoidoscopy done which did not show anything acute. Her working diagnosis is a rectal ulcer that she irritates with bowel movements. She states that her bowels are now soft in 3 times a day. She has been tried to change her diet is I've advised multiple times in the past as this often causes issues with her stomach in her bowels. She denies any shortness of breath with the episodes it feels more like pressure. She started taking some other antacids which help relieve the pressure. Of note she did have stress testing done before her scope she states they told her her heart was normal this was done at [facility]:  GEN- denies fatigue, fever, weight loss,weakness, recent illness HEENT- denies eye drainage, change in vision, nasal discharge, CVS- + chest pain, palpitations RESP- denies SOB, cough, wheeze ABD- denies N/V, change in stools, abd pain GU- denies dysuria, hematuria, dribbling, incontinence MSK- denies joint pain, muscle aches, injury Neuro- denies headache, dizziness, syncope, seizure activity       Objective:    BP 118/74 mmHg  Pulse 62  Temp(Src) 98.3 F (36.8 C) (Oral)  Resp 14  Ht 5\' 5"  (1.651 m)  Wt 176 lb (79.833 kg)  BMI 29.29 kg/m2 GEN- NAD, alert and oriented x3 HEENT- PERRL, EOMI, non injected sclera, pink conjunctiva, MMM, oropharynx clear CVS- RRR, no  murmur RESP-CTAB ABD-NABS,soft,NT,ND, urostomy bag  EXT- No edema Pulses- Radial  2+  EKG- NSR       Assessment & Plan:      Problem List Items Addressed This Visit    GERD (gastroesophageal reflux disease)    We'll have her add Zantac back at bedtime and continued Dexilant in the morning He also discussed the foods that she should avoid to help control her symptoms. Her EKG was normal.       Other Visit Diagnoses    Chest pain, unspecified    -  Primary    Relevant Orders    EKG 12-Lead (Completed)    EKG 12-Lead (Completed)       Note: This dictation was prepared with Dragon dictation along with smaller phrase technology. Any transcriptional errors that result from this process are unintentional.

## 2015-09-26 NOTE — Assessment & Plan Note (Signed)
We'll have her add Zantac back at bedtime and continued Dexilant in the morning He also discussed the foods that she should avoid to help control her symptoms. Her EKG was normal.

## 2015-10-31 ENCOUNTER — Other Ambulatory Visit: Payer: Self-pay | Admitting: Surgical Oncology

## 2015-10-31 DIAGNOSIS — G8929 Other chronic pain: Secondary | ICD-10-CM

## 2015-10-31 DIAGNOSIS — K6289 Other specified diseases of anus and rectum: Principal | ICD-10-CM

## 2015-11-01 DIAGNOSIS — C679 Malignant neoplasm of bladder, unspecified: Secondary | ICD-10-CM | POA: Diagnosis not present

## 2015-11-01 DIAGNOSIS — Z936 Other artificial openings of urinary tract status: Secondary | ICD-10-CM | POA: Diagnosis not present

## 2015-11-02 ENCOUNTER — Encounter (INDEPENDENT_AMBULATORY_CARE_PROVIDER_SITE_OTHER): Payer: Self-pay | Admitting: *Deleted

## 2015-11-05 DIAGNOSIS — K6289 Other specified diseases of anus and rectum: Secondary | ICD-10-CM | POA: Insufficient documentation

## 2015-11-06 ENCOUNTER — Telehealth: Payer: Self-pay | Admitting: Family Medicine

## 2015-11-06 MED ORDER — MORPHINE SULFATE ER 15 MG PO TBCR: 15.0000 mg | EXTENDED_RELEASE_TABLET | Freq: Two times a day (BID) | ORAL | Status: DC

## 2015-11-06 MED ORDER — OXYCODONE HCL 5 MG PO TABS: 5.0000 mg | ORAL_TABLET | Freq: Four times a day (QID) | ORAL | Status: DC | PRN

## 2015-11-06 NOTE — Telephone Encounter (Signed)
Of note, patient is not on Hydrocodone.   Ok to refill??  Last office visit/ refill on Morphine and Oxycodone 09/26/2015.

## 2015-11-06 NOTE — Telephone Encounter (Signed)
Prescription printed and patient made aware to come to office to pick up after 2pm on 11/06/2015.

## 2015-11-06 NOTE — Telephone Encounter (Signed)
Okay to refill? 

## 2015-11-06 NOTE — Telephone Encounter (Signed)
Patient is calling to get rx for her hydrocodone and morphine  [phone]

## 2015-11-09 ENCOUNTER — Other Ambulatory Visit: Payer: Medicare Other

## 2015-11-10 ENCOUNTER — Encounter: Payer: Self-pay | Admitting: Family Medicine

## 2015-11-10 ENCOUNTER — Other Ambulatory Visit: Payer: Self-pay | Admitting: Family Medicine

## 2015-11-10 ENCOUNTER — Ambulatory Visit (INDEPENDENT_AMBULATORY_CARE_PROVIDER_SITE_OTHER): Payer: Medicare Other | Admitting: Family Medicine

## 2015-11-10 ENCOUNTER — Ambulatory Visit
Admission: RE | Admit: 2015-11-10 | Discharge: 2015-11-10 | Disposition: A | Discharge status: Home / Self Care | Payer: Medicaid Other | Source: Ambulatory Visit | Attending: Surgical Oncology | Admitting: Surgical Oncology

## 2015-11-10 VITALS — BP 130/74 | HR 72 | Temp 97.9°F | Resp 16 | Ht 65.0 in | Wt 176.0 lb

## 2015-11-10 DIAGNOSIS — K6289 Other specified diseases of anus and rectum: Secondary | ICD-10-CM | POA: Diagnosis not present

## 2015-11-10 DIAGNOSIS — G8929 Other chronic pain: Secondary | ICD-10-CM

## 2015-11-10 DIAGNOSIS — M436 Torticollis: Secondary | ICD-10-CM | POA: Diagnosis not present

## 2015-11-10 MED ORDER — IOPAMIDOL (ISOVUE-300) INJECTION 61%
100.0000 mL | Freq: Once | INTRAVENOUS | Status: AC | PRN
  Administered 2015-11-10: 100 mL via INTRAVENOUS

## 2015-11-10 MED ORDER — CYCLOBENZAPRINE HCL 10 MG PO TABS: 10.0000 mg | ORAL_TABLET | Freq: Three times a day (TID) | ORAL | Status: DC | PRN

## 2015-11-10 NOTE — Progress Notes (Signed)
Patient ID: [name], female   DOB: 1950-08-19, 65 y.o.   MRN: [id number]    Subjective:    Patient ID: [name], female    DOB: 01/25/1951, 65 y.o.   MRN: [id number]  Patient presents for Muscle Pain Patient with right-sided neck pain. 2 nights ago she slept with a tight bra that was taken into her shoulder which she will cup she had swelling and pain and was unable to move her neck towards the left side. She uses heating pad and warm showers and a muscle rub and now is able to move her neck around but still has some swelling and pain on her shoulder and upper back. No chest pain shortness of breath or other systemic symptoms she is taking her other medications as prescribed    Review Of Systems:  GEN- denies fatigue, fever, weight loss,weakness, recent illness HEENT- denies eye drainage, change in vision, nasal discharge, CVS- denies chest pain, palpitations RESP- denies SOB, cough, wheeze MSK-+ joint pain, muscle aches, injury Neuro- denies headache, dizziness, syncope, seizure activity       Objective:    BP 130/74 mmHg  Pulse 72  Temp(Src) 97.9 F (36.6 C) (Oral)  Resp 16  Ht 5\' 5"  (1.651 m)  Wt 176 lb (79.833 kg)  BMI 29.29 kg/m2 GEN- NAD, alert and oriented x3 HEENT- PERRL, EOMI, non injected sclera, pink conjunctiva, MMM, oropharynx clear Neck- Supple, no LAD, +SPASM Of trapezius, Mild swelling on anterior right shoulder and lateral aspect of neck, FROM C spine NT        Assessment & Plan:      Problem List Items Addressed This Visit    None    Visit Diagnoses    Torticollis, acute    -  Primary    acquired with sleeping position/possible tight undergarments, given flexeril, continue muscle rub, also heat, already improving, ROM improved       Note: This dictation was prepared with Dragon dictation along with smaller phrase technology. Any transcriptional errors that result from this process are unintentional.

## 2015-11-10 NOTE — Patient Instructions (Signed)
Take muscle relaxer as prescribed Use the topical rub F/U as previous

## 2015-11-10 NOTE — Telephone Encounter (Signed)
Refill appropriate and filled per protocol. 

## 2015-11-17 ENCOUNTER — Other Ambulatory Visit: Payer: Self-pay | Admitting: Family Medicine

## 2015-11-17 NOTE — Telephone Encounter (Signed)
Refill appropriate and filled per protocol. 

## 2015-11-22 ENCOUNTER — Emergency Department (HOSPITAL_COMMUNITY)
Admission: EM | Admit: 2015-11-22 | Discharge: 2015-11-22 | Disposition: A | Discharge status: Home / Self Care | Payer: Medicare Other | Attending: Emergency Medicine | Admitting: Emergency Medicine

## 2015-11-22 ENCOUNTER — Encounter (HOSPITAL_COMMUNITY): Payer: Self-pay | Admitting: *Deleted

## 2015-11-22 DIAGNOSIS — Z79899 Other long term (current) drug therapy: Secondary | ICD-10-CM | POA: Insufficient documentation

## 2015-11-22 DIAGNOSIS — R05 Cough: Secondary | ICD-10-CM | POA: Insufficient documentation

## 2015-11-22 DIAGNOSIS — E785 Hyperlipidemia, unspecified: Secondary | ICD-10-CM | POA: Insufficient documentation

## 2015-11-22 DIAGNOSIS — N390 Urinary tract infection, site not specified: Secondary | ICD-10-CM | POA: Diagnosis not present

## 2015-11-22 DIAGNOSIS — M7989 Other specified soft tissue disorders: Secondary | ICD-10-CM | POA: Insufficient documentation

## 2015-11-22 DIAGNOSIS — R1032 Left lower quadrant pain: Secondary | ICD-10-CM | POA: Diagnosis present

## 2015-11-22 DIAGNOSIS — E039 Hypothyroidism, unspecified: Secondary | ICD-10-CM | POA: Diagnosis not present

## 2015-11-22 DIAGNOSIS — I10 Essential (primary) hypertension: Secondary | ICD-10-CM | POA: Diagnosis not present

## 2015-11-22 DIAGNOSIS — Z87891 Personal history of nicotine dependence: Secondary | ICD-10-CM | POA: Insufficient documentation

## 2015-11-22 LAB — I-STAT CHEM 8, ED
BUN: 19 mg/dL (ref 6–20)
CALCIUM ION: 1.05 mmol/L — AB (ref 1.13–1.30)
CHLORIDE: 102 mmol/L (ref 101–111)
Creatinine, Ser: 1.1 mg/dL — ABNORMAL HIGH (ref 0.44–1.00)
GLUCOSE: 124 mg/dL — AB (ref 65–99)
HCT: 33 % — ABNORMAL LOW (ref 36.0–46.0)
HEMOGLOBIN: 11.2 g/dL — AB (ref 12.0–15.0)
Potassium: 3.1 mmol/L — ABNORMAL LOW (ref 3.5–5.1)
SODIUM: 141 mmol/L (ref 135–145)
TCO2: 24 mmol/L (ref 0–100)

## 2015-11-22 LAB — URINE MICROSCOPIC-ADD ON: SQUAMOUS EPITHELIAL / LPF: NONE SEEN

## 2015-11-22 LAB — URINALYSIS, ROUTINE W REFLEX MICROSCOPIC
Bilirubin Urine: NEGATIVE
Glucose, UA: NEGATIVE mg/dL
Ketones, ur: NEGATIVE mg/dL
NITRITE: POSITIVE — AB
pH: 6 (ref 5.0–8.0)

## 2015-11-22 MED ORDER — POTASSIUM CHLORIDE CRYS ER 20 MEQ PO TBCR
40.0000 meq | EXTENDED_RELEASE_TABLET | Freq: Once | ORAL | Status: AC
  Administered 2015-11-22: 40 meq via ORAL
  Filled 2015-11-22: qty 2

## 2015-11-22 MED ORDER — CIPROFLOXACIN HCL 500 MG PO TABS: 500.0000 mg | ORAL_TABLET | Freq: Two times a day (BID) | ORAL | Status: DC

## 2015-11-22 MED ORDER — CIPROFLOXACIN HCL 250 MG PO TABS
500.0000 mg | ORAL_TABLET | Freq: Once | ORAL | Status: AC
  Administered 2015-11-22: 500 mg via ORAL
  Filled 2015-11-22: qty 2

## 2015-11-22 NOTE — ED Provider Notes (Signed)
CSN: [id number]     Arrival date & time 11/22/15  [id number] History  By signing my name below, I, Nicole Kindred, attest that this documentation has been prepared under the direction and in the presence of Ripley Fraise, MD.   Electronically Signed: Nicole Kindred, ED Scribe. 11/22/2015. 8:09 AM   Chief Complaint  Patient presents with  . Abdominal Pain    Patient is a 65 y.o. female presenting with abdominal pain. The history is provided by the patient. No language interpreter was used.  Abdominal Pain Pain location:  LLQ and RLQ Pain radiates to:  Does not radiate Pain severity:  Moderate Onset quality:  Gradual Duration:  3 days Timing:  Constant Progression:  Unchanged Chronicity:  New Relieved by:  Nothing Worsened by:  Nothing tried Ineffective treatments:  None tried Associated symptoms: cough and fatigue   Associated symptoms: no chills, no diarrhea, no fever, no nausea and no vomiting    HPI Comments: [name] is a 65 y.o. female who presents to the Emergency Department complaining of gradual onset, fatigue, beginning two days ago. Pt reports associated lower abdominal pain, non-productive cough, leg swelling, and mild weakness. Pt had a urostomy placed in 2010 and reports abdominal pain in the surrounding area. No other associated symptoms noted. Pt has received ciprofloxacin during similar episodes in the past. No other worsening or alleviating factors noted. Pt denies fever, chills, nausea, emesis, shortness of breath, back pain, diarrhea, syncope, or any other pertinent symptoms.   Past Medical History  Diagnosis Date  . Bowel obstruction (HCC) several    Recurrent SBO secondary to adhesions  . Hypothyroidism   . Acid reflux   . Chronic back pain   . Hyperlipidemia   . Hypertension     pt says taken off medication since has lost weight.  . Cancer [facility]) 2011 [facility])    diagnosed in 2009 per pt. Invasive High grade Urothelial carcinoma- s/p  radiation and Chemo   . UTI (lower urinary tract infection)     Recurrent  . Malnutrition ([place])   . Leukopenia 12/06/2011    HIV serology negative  . Fatty liver   . DVT (deep venous thrombosis) ([place]) 2015  . PE (pulmonary embolism)     [facility]  . Rectal ulcer 2016    [facility]  . Internal hemorrhoids    Past Surgical History  Procedure Laterality Date  . Back surgery      X2  . Hernia repair      mesh  . Abdominal surgery      with intestinal "puncture" x 2, exploratory surgery   . Cholecystectomy    . Ileo conduit      For bladder cancer  . Cystectomy      breast  . Portacath placement    . Bladder removal     Family History  Problem Relation Age of Onset  . Heart disease Mother     enlarged  . Hypertension Mother   . Diabetes Father   . Diabetes Sister   . Diabetes Maternal Grandmother   . Colon cancer Neg Hx    Social History  Substance Use Topics  . Smoking status: Former Smoker    Quit date: 09/26/2010  . Smokeless tobacco: Never Used  . Alcohol Use: No   OB History    No data available     Review of Systems  Constitutional: Positive for fatigue. Negative for fever and chills.  Respiratory: Positive for  cough.   Cardiovascular: Positive for leg swelling.  Gastrointestinal: Positive for abdominal pain. Negative for nausea, vomiting and diarrhea.  Musculoskeletal: Negative for back pain.  Neurological: Positive for weakness. Negative for syncope and headaches.  All other systems reviewed and are negative.   Allergies  Bactrim; Cefixime; Codeine; Lactose; Nitrofuran derivatives; Penicillins; and Sulfa antibiotics  Home Medications   Prior to Admission medications   Medication Sig Start Date End Date Taking? Authorizing Provider  belladonna-opium (B&O SUPPRETTES) 16.2-30 MG suppository Place rectally. 02/13/15   Historical Provider, MD  cyclobenzaprine (FLEXERIL) 10 MG tablet Take 1 tablet (10 mg total) by mouth 3 (three) times daily  as needed for muscle spasms. 11/10/15   Alycia Rossetti, MD  DEXILANT 60 MG capsule TAKE ONE CAPSULE BY MOUTH EVERY DAY 05/30/15   Alycia Rossetti, MD  docusate sodium (COLACE) 100 MG capsule Take 100 mg by mouth 2 (two) times daily.     Historical Provider, MD  ELIQUIS 5 MG TABS tablet TAKE ONE TABLET BY MOUTH TWICE DAILY 11/17/15   Alycia Rossetti, MD  furosemide (LASIX) 40 MG tablet Take 1 tablet (40 mg total) by mouth 2 (two) times daily. 08/01/15   Alycia Rossetti, MD  hydrocortisone-pramoxine (PROCTOFOAM [facility]) rectal foam Place 1 applicator rectally 2 (two) times daily as needed for hemorrhoids or itching. 11/08/14   Alycia Rossetti, MD  levothyroxine (SYNTHROID, LEVOTHROID) 100 MCG tablet Take 1 tablet (100 mcg total) by mouth daily. 11/17/15   Alycia Rossetti, MD  loperamide (IMODIUM) 2 MG capsule Take by mouth as needed for diarrhea or loose stools.    Historical Provider, MD  Magnesium Oxide 400 (240 MG) MG TABS TAKE TWO (2) TABLETS BY MOUTH TWICE DAILY 05/23/15   Alycia Rossetti, MD  morphine (MS CONTIN) 15 MG 12 hr tablet Take 1 tablet (15 mg total) by mouth every 12 (twelve) hours. 11/06/15   Alycia Rossetti, MD  ondansetron (ZOFRAN ODT) 8 MG disintegrating tablet Take 1 tablet (8 mg total) by mouth every 8 (eight) hours as needed for nausea. 04/25/15   Alycia Rossetti, MD  oxyCODONE (OXY IR/ROXICODONE) 5 MG immediate release tablet Take 1 tablet (5 mg total) by mouth every 6 (six) hours as needed for severe pain. 11/06/15   Alycia Rossetti, MD  potassium chloride (KLOR-CON 10) 10 MEQ tablet Take 1 tablet (10 mEq total) by mouth daily. 08/04/15   Alycia Rossetti, MD  pravastatin (PRAVACHOL) 20 MG tablet TAKE ONE TABLET BY MOUTH DAILY FOR CHOLESTEROL 11/10/15   Alycia Rossetti, MD  Probiotic Product (ALIGN) 4 MG CAPS Take 1 capsule daily 06/02/15   Alycia Rossetti, MD  Suvorexant 10 MG TABS Take 10 tablets by mouth at bedtime as needed. 11/08/14   Alycia Rossetti, MD   BP 132/83 mmHg   Pulse 87  Temp(Src) 97.7 F (36.5 C) (Oral)  Resp 16  Ht 5\' 5"  (1.651 m)  Wt 180 lb (81.647 kg)  BMI 29.95 kg/m2  SpO2 95% Physical Exam CONSTITUTIONAL: Well developed/well nourished HEAD: Normocephalic/atraumatic EYES: EOMI/PERRL ENMT: Mucous membranes moist NECK: supple no meningeal signs SPINE/BACK:entire spine nontender CV: S1/S2 noted, no murmurs/rubs/gallops noted LUNGS: Lungs are clear to auscultation bilaterally, no apparent distress ABDOMEN: soft, nontender, no rebound or guarding, bowel sounds noted throughout abdomen, urostomy in place, multiple healed scars, no focal tenderness GU:no cva tenderness NEURO: Pt is awake/alert/appropriate, moves all extremitiesx4.  No facial droop.   EXTREMITIES: pulses normal/equal, full ROM,  no LE edema is noted SKIN: warm, color normal PSYCH: no abnormalities of mood noted, alert and oriented to situation  ED Course  Procedures  DIAGNOSTIC STUDIES: Oxygen Saturation is 95% on RA, adequate by my interpretation.    COORDINATION OF CARE: 8:09 AM Discussed treatment plan which includes ciprofloxacin, urinalysis, i-stat chem 8, and EKG with pt at bedside and pt agreed to plan.  Pt well appearing She reports this is similar to prior episodes of UTI She is not septic appearing Mild hypokalemia, took dose of lasix recently but not currently, will give extra dose here  Labs Review Labs Reviewed  URINALYSIS, ROUTINE W REFLEX MICROSCOPIC (NOT AT [facility] - Abnormal; Notable for the following:    APPearance HAZY (*)    Specific Gravity, Urine <1.005 (*)    Hgb urine dipstick MODERATE (*)    Protein, ur TRACE (*)    Nitrite POSITIVE (*)    Leukocytes, UA SMALL (*)    All other components within normal limits  URINE MICROSCOPIC-ADD ON - Abnormal; Notable for the following:    Bacteria, UA MANY (*)    All other components within normal limits  I-STAT CHEM 8, ED - Abnormal; Notable for the following:    Potassium 3.1 (*)    Creatinine, Ser  1.10 (*)    Glucose, Bld 124 (*)    Calcium, Ion 1.05 (*)    Hemoglobin 11.2 (*)    HCT 33.0 (*)    All other components within normal limits  URINE CULTURE    I have personally reviewed and evaluated these lab results as part of my medical decision-making.   EKG Interpretation   Date/Time:  Wednesday Nov 22 2015 08:27:43 EDT Ventricular Rate:  79 PR Interval:  156 QRS Duration: 86 QT Interval:  356 QTC Calculation: 408 R Axis:   55 Text Interpretation:  Sinus rhythm Atrial premature complex Normal ECG  Confirmed by Christy Gentles  MD, Elenore Rota ([id number]) on 11/22/2015 8:33:02 AM      Medications  ciprofloxacin (CIPRO) tablet 500 mg (500 mg Oral Given 11/22/15 0852)  potassium chloride SA (K-DUR,KLOR-CON) CR tablet 40 mEq (40 mEq Oral Given 11/22/15 0853)    MDM   Final diagnoses:  Complicated UTI (urinary tract infection)    Nursing notes including past medical history and social history reviewed and considered in documentation Labs/vital reviewed myself and considered during evaluation   I personally performed the services described in this documentation, which was scribed in my presence. The recorded information has been reviewed and is accurate.     Ripley Fraise, MD 11/22/15 0900

## 2015-11-22 NOTE — ED Notes (Signed)
Pt comes in with lower abdominal pain. Pt states this started on Monday. Pt has a urostomy bag in place and states her urine has been cloudy and malodorous. Pt denies any n/v/d. NAD noted.

## 2015-11-22 NOTE — ED Notes (Signed)
MD at bedside. 

## 2015-11-23 ENCOUNTER — Encounter (INDEPENDENT_AMBULATORY_CARE_PROVIDER_SITE_OTHER): Payer: Self-pay | Admitting: Internal Medicine

## 2015-11-23 ENCOUNTER — Telehealth (INDEPENDENT_AMBULATORY_CARE_PROVIDER_SITE_OTHER): Payer: Self-pay | Admitting: *Deleted

## 2015-11-23 ENCOUNTER — Ambulatory Visit (INDEPENDENT_AMBULATORY_CARE_PROVIDER_SITE_OTHER): Payer: Medicare Other | Admitting: Internal Medicine

## 2015-11-23 VITALS — BP 152/90 | HR 64 | Temp 98.1°F | Ht 65.5 in | Wt 180.9 lb

## 2015-11-23 DIAGNOSIS — K6289 Other specified diseases of anus and rectum: Secondary | ICD-10-CM

## 2015-11-23 MED ORDER — HYDROCORTISONE ACE-PRAMOXINE 1-1 % RE FOAM: 1.0000 | Freq: Two times a day (BID) | RECTAL | Status: DC | PRN

## 2015-11-23 NOTE — Progress Notes (Signed)
Subjective:    Patient ID: [name], female    DOB: [birth year], 65 y.o.   MRN: [id number]  HPI  Referred by Dr. Buelah Manis. She c/o rectal pain. She says she has had rectal pain for 18 months.  She says she also has some rectal pain. The pain feels like a cut. She has Lainocaine to put on her rectum for the pain.  She has the pain about 3 times a week. She says she has about 10 bowel movements a day. Stools are formed. She had her bladder removed for cancer in 2010 at [place]. She underwent radiation and chemo.   Her appetite is not really good per patient. She is gaining weight.  Evaluated in the ED yesterday and noted to have a UTI and started on Cipro.   Per records she underwent anoscopy 02/24/2015 by Dr. Morton Stall which revealed anal condyloma and was excised 02/24/2015. Biopsy benign with hyperkarotosis.   She was referred to [facility] for a second opinion as there has been no other obvious source of her rectal pain.   09/04/2015 Flexible sigmoid.:  Anal pain. Dr. Madilyn Fireman. Normal exam.    06/19/2014 colonoscopy: Abnormal barium enema: A few scattered small mouth  Diverticula at sigmoid colon.    Surgery at [facility] for a small bowel obstruction and ended up on TPN for 3 years.    10/18/2014 Colonoscopy   The colonoscopy was completed to the intended extent. An ileocecal valve photo was captured An appendiceal orifice photo was captured.  There was not adequate preparation of the colon to follow recommended surveillance guidelines.  Colonoscopy Polyp Specimen Collection: No polyps removed.  Biopsy Colonic mucosa with no significant pathologic diagnosis. No evidence of lymphocytic or collagenous colitis. Rectal ulcer, endoscopic biopsy:  Colonic mucosa with focal active cryptitis along with focal smooth muscle hyperplasia and hyalinization of the lamina propria and focal superficial erosion, Findings are suspicious for rectal prolapse. Loose stools, blood in stool. Per  [facility] colonoscopy report:Preparation of the colon was poor.Non-thrombosed external hemorrhoids and internal hemorrhoids (grade 1) found on digital rectal exam.Stool in the entire examined colon.Mucosal ulceration.Biopsied.Non-bleeding internal hemorrhoids.The examination was otherwise normal.        Review of Systems Past Medical History  Diagnosis Date  . Bowel obstruction (HCC) several    Recurrent SBO secondary to adhesions  . Hypothyroidism   . Acid reflux   . Chronic back pain   . Hyperlipidemia   . Hypertension     pt says taken off medication since has lost weight.  . Cancer [facility]) 2011 [facility])    diagnosed in 2009 per pt. Invasive High grade Urothelial carcinoma- s/p radiation and Chemo   . UTI (lower urinary tract infection)     Recurrent  . Malnutrition ([place])   . Leukopenia 12/06/2011    HIV serology negative  . Fatty liver   . DVT (deep venous thrombosis) ([place]) 2015  . PE (pulmonary embolism)     [facility]  . Rectal ulcer 2016    [facility]  . Internal hemorrhoids     Past Surgical History  Procedure Laterality Date  . Back surgery      X2  . Hernia repair      mesh  . Abdominal surgery      with intestinal "puncture" x 2, exploratory surgery   . Cholecystectomy    . Ileo conduit      For bladder cancer  . Cystectomy  breast  . Portacath placement    . Bladder removal      Allergies  Allergen Reactions  . Bactrim [Sulfamethoxazole-Trimethoprim]   . Cefixime Diarrhea  . Codeine Hives  . Lactose Diarrhea  . Nitrofuran Derivatives Itching  . Penicillins Hives  . Sulfa Antibiotics Hives    Current Outpatient Prescriptions on File Prior to Visit  Medication Sig Dispense Refill  . belladonna-opium (B&O SUPPRETTES) 16.2-30 MG suppository Place rectally.    . ciprofloxacin (CIPRO) 500 MG tablet Take 1 tablet (500 mg total) by mouth 2 (two) times daily. One po bid x 7 days 14 tablet 0  . cyclobenzaprine  (FLEXERIL) 10 MG tablet Take 1 tablet (10 mg total) by mouth 3 (three) times daily as needed for muscle spasms. 20 tablet 0  . DEXILANT 60 MG capsule TAKE ONE CAPSULE BY MOUTH EVERY DAY 90 capsule 3  . docusate sodium (COLACE) 100 MG capsule Take 100 mg by mouth 2 (two) times daily.     Marland Kitchen ELIQUIS 5 MG TABS tablet TAKE ONE TABLET BY MOUTH TWICE DAILY 60 tablet 3  . furosemide (LASIX) 40 MG tablet Take 1 tablet (40 mg total) by mouth 2 (two) times daily. 180 tablet 0  . hydrocortisone-pramoxine (PROCTOFOAM HC) rectal foam Place 1 applicator rectally 2 (two) times daily as needed for hemorrhoids or itching. 10 g 1  . levothyroxine (SYNTHROID, LEVOTHROID) 100 MCG tablet Take 1 tablet (100 mcg total) by mouth daily. 30 tablet 3  . loperamide (IMODIUM) 2 MG capsule Take by mouth as needed for diarrhea or loose stools.    . Magnesium Oxide 400 (240 MG) MG TABS TAKE TWO (2) TABLETS BY MOUTH TWICE DAILY 120 tablet 0  . morphine (MS CONTIN) 15 MG 12 hr tablet Take 1 tablet (15 mg total) by mouth every 12 (twelve) hours. 60 tablet 0  . ondansetron (ZOFRAN ODT) 8 MG disintegrating tablet Take 1 tablet (8 mg total) by mouth every 8 (eight) hours as needed for nausea. 45 tablet 0  . oxyCODONE (OXY IR/ROXICODONE) 5 MG immediate release tablet Take 1 tablet (5 mg total) by mouth every 6 (six) hours as needed for severe pain. 100 tablet 0  . potassium chloride (KLOR-CON 10) 10 MEQ tablet Take 1 tablet (10 mEq total) by mouth daily. 30 tablet 3  . pravastatin (PRAVACHOL) 20 MG tablet TAKE ONE TABLET BY MOUTH DAILY FOR CHOLESTEROL 30 tablet 3  . Suvorexant 10 MG TABS Take 10 tablets by mouth at bedtime as needed. 10 tablet 0  . Probiotic Product (ALIGN) 4 MG CAPS Take 1 capsule daily (Patient not taking: Reported on 11/23/2015) 30 capsule 3   No current facility-administered medications on file prior to visit.        Objective:   Physical Exam Blood pressure 152/90, pulse 64, temperature 98.1 F (36.7 C), height  5' 5.5" (1.664 m), weight 180 lb 14.4 oz (82.056 kg).  Alert and oriented. Skin warm and dry. Oral mucosa is moist.   . Sclera anicteric, conjunctivae is pink. Thyroid not enlarged. No cervical lymphadenopathy. Lungs clear. Heart regular rate and rhythm.  Abdomen is soft. Bowel sounds are positive. No hepatomegaly. No abdominal masses felt. No tenderness.  No edema to lower extremities.  Tenderness to rectum. I did not feel a fissure. ? Hemorrhoids. .       Assessment & Plan:  Rectal pain ? Etiology.  ? Internal hemorrhoids. Rx for Anusol suppositories. OV in 3 months. I will discuss with Dr. Laural Golden.

## 2015-11-23 NOTE — Telephone Encounter (Signed)
Proctofoam is not cover nor is Procto cream. Per Deberah Castle, NP may call in Anusol HC Cream - Patient is to inset 1 applicator rectally twice daily , Disp 30 grams - 3 refills. This was called to [facility].

## 2015-11-23 NOTE — Patient Instructions (Signed)
Rx for Proctofoam sent to her pharmacy 

## 2015-11-24 LAB — URINE CULTURE: Culture: >=100000 — AB

## 2015-11-25 ENCOUNTER — Telehealth (HOSPITAL_BASED_OUTPATIENT_CLINIC_OR_DEPARTMENT_OTHER): Payer: Self-pay

## 2015-11-25 NOTE — Telephone Encounter (Signed)
Post ED Visit - Positive Culture Follow-up  Culture report reviewed by antimicrobial stewardship pharmacist:  []  Elenor Quinones, Pharm.D. []  Heide Guile, Pharm.D., BCPS [x]  Parks Neptune, Pharm.D. []  Alycia Rossetti, Pharm.D., BCPS []  [place], Pharm.D., BCPS, AAHIVP []  Legrand Como, Pharm.D., BCPS, AAHIVP []  Milus Glazier, Pharm.D. []  Stephens November, Pharm.D.  Positive urine culture Treated with Ciprofloxacin, organism sensitive to the same and no further patient follow-up is required at this time.  Genia Del 11/25/2015, 9:03 AM

## 2015-11-27 IMAGING — CT CT ABD-PELV W/ CM
2 of 5 series · 15 of 46 positions shown, 17 images · IV contrast (Omnipaque 300)
Comparison: 02/22/2014

CLINICAL DATA: Initial evaluation for nausea diarrhea generalized
weakness for 3 days, periumbilical pain, history of bowel
obstruction and fistulous as well as ileal conduit, bladder removal

EXAM:
CT ABDOMEN AND PELVIS WITH CONTRAST
TECHNIQUE: Multidetector CT imaging of the abdomen and pelvis was performed
using the standard protocol following bolus administration of
intravenous contrast.
CONTRAST:  100mL OMNIPAQUE IOHEXOL 300 MG/ML SOLN, 50mL OMNIPAQUE
IOHEXOL 300 MG/ML SOLN

[Series 2: abd_pel_with 5.0 b40f · axial · 0.72mm/px · z∈[-420,-5]mm · 12 of 97 slices shown, 14 images]
[im 7/97  soft-tissue]
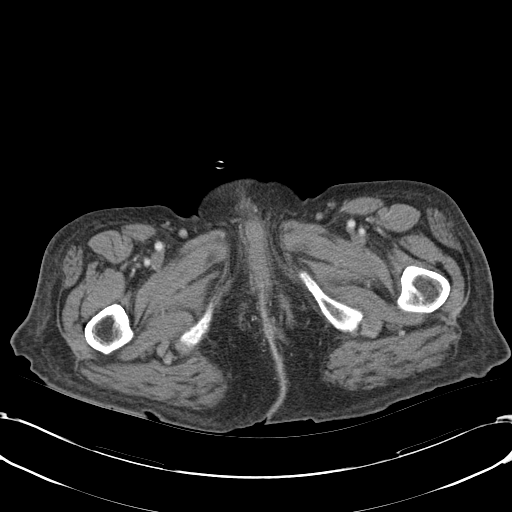
[im 7/97  bone]
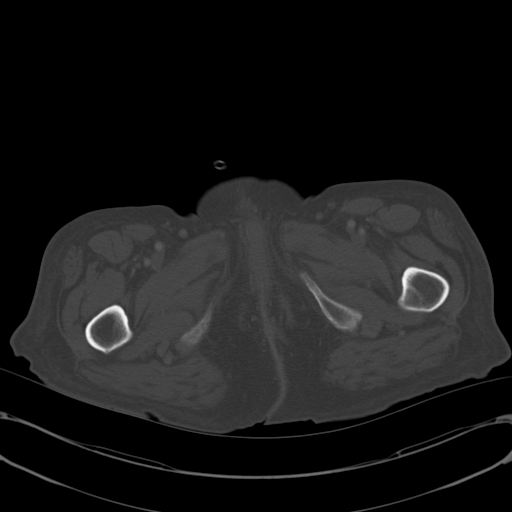
[im 14/97  soft-tissue]
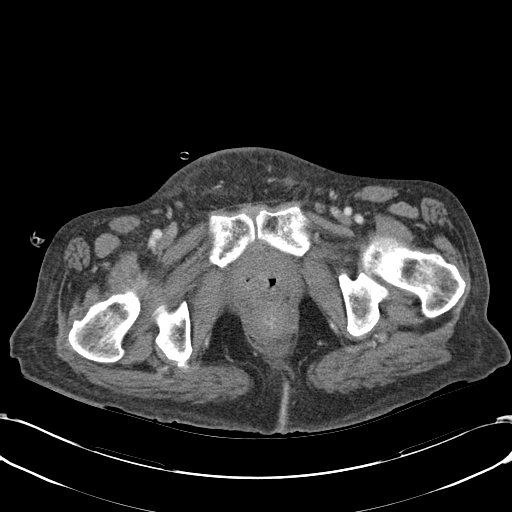
[im 21/97  soft-tissue]
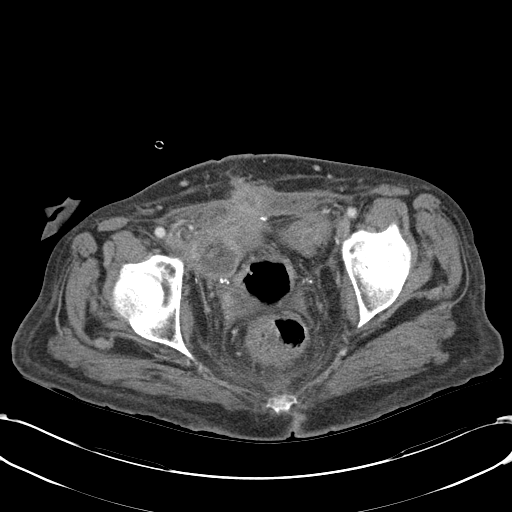
[im 28/97  soft-tissue]
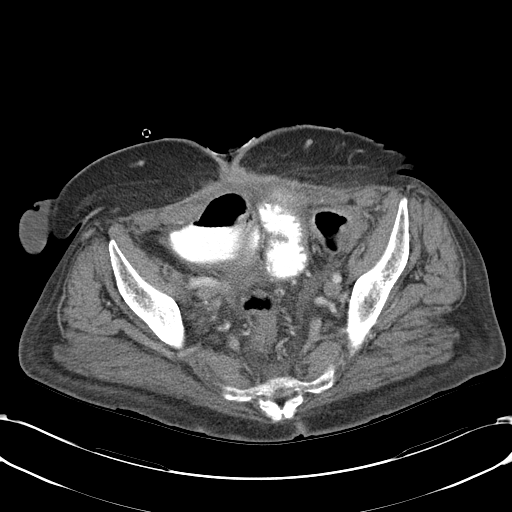
[im 35/97  soft-tissue]
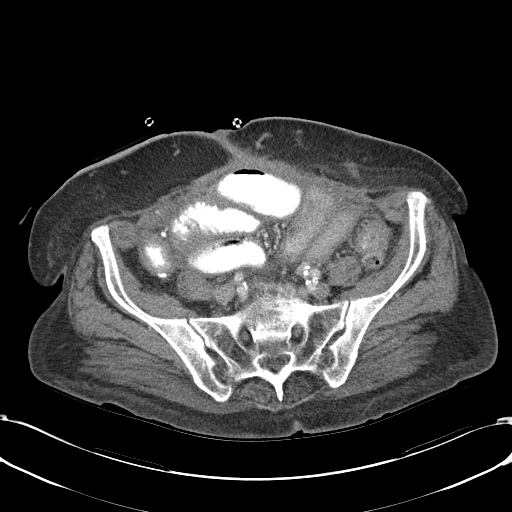
[im 42/97  soft-tissue]
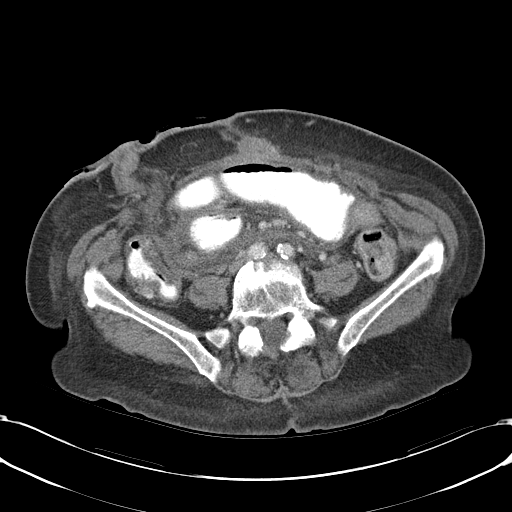
[im 55/97  soft-tissue]
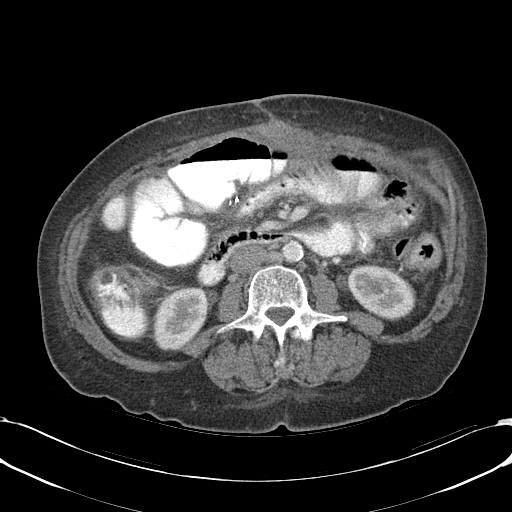
[im 62/97  soft-tissue]
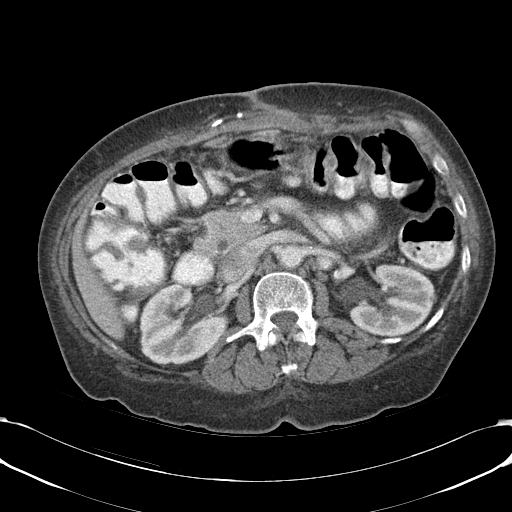
[im 69/97  soft-tissue]
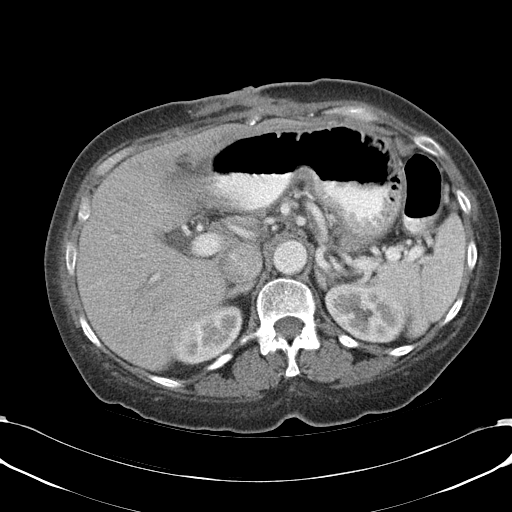
[im 69/97  bone]
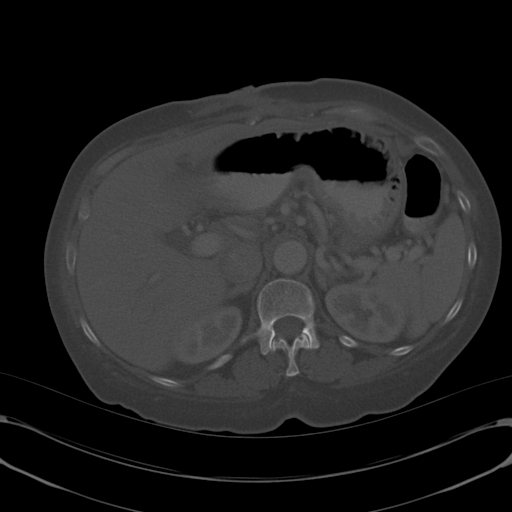
[im 76/97  soft-tissue]
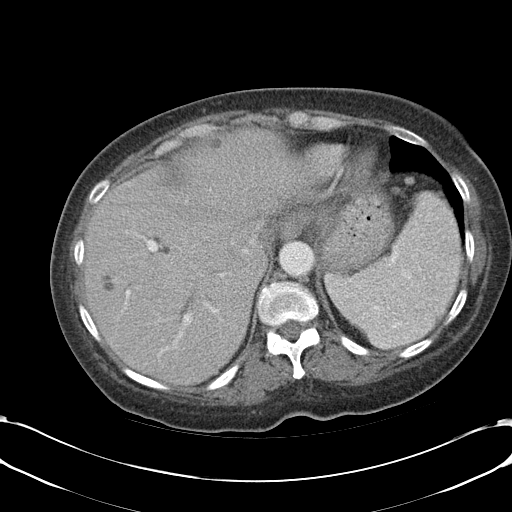
[im 83/97  soft-tissue]
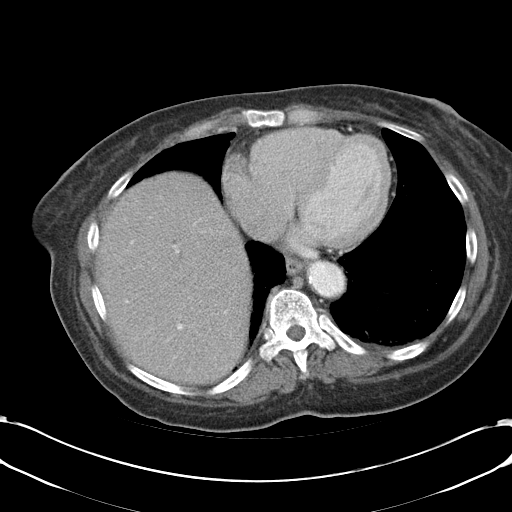
[im 90/97  soft-tissue]
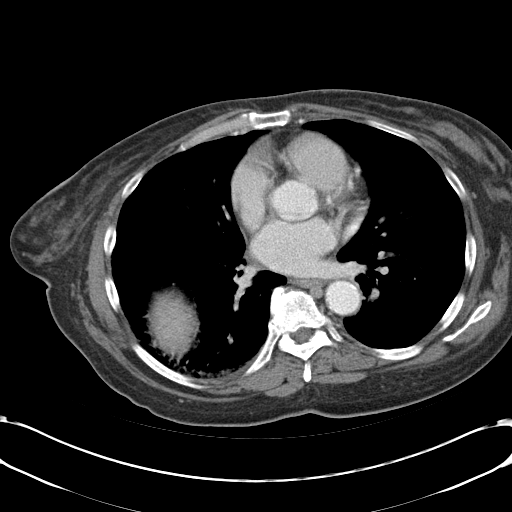

[Series 3: abd_pel_with 3.0 spo · coronal · 0.69mm/px · 3 of 89 slices shown]
[im 30/89  soft-tissue]
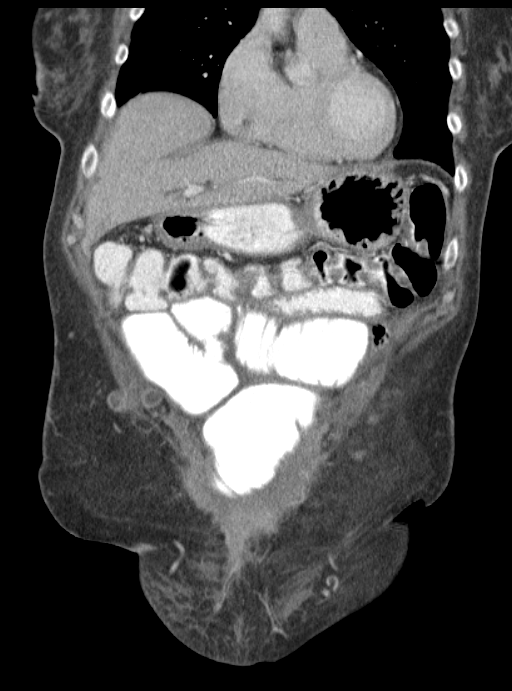
[im 40/89  soft-tissue]
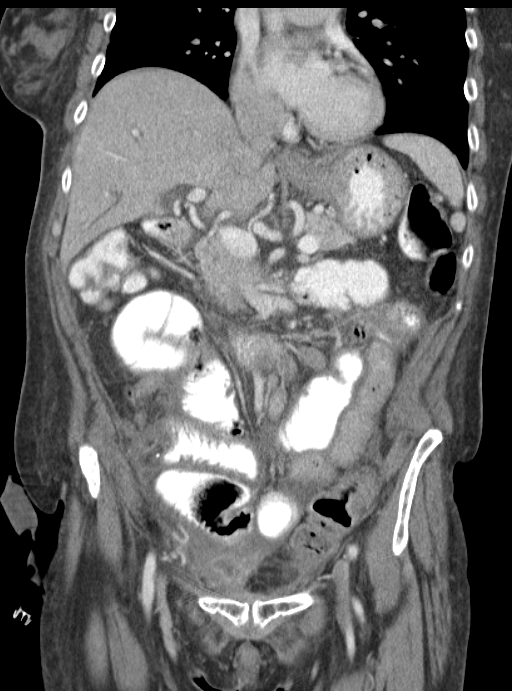
[im 49/89  soft-tissue]
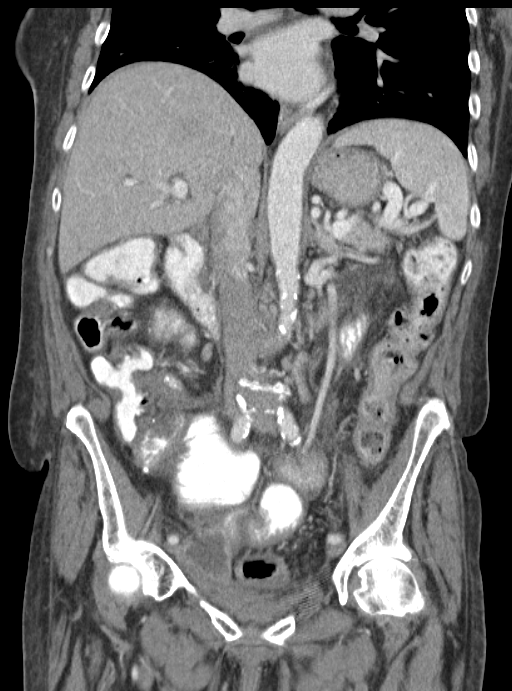

[15 of 46 positions shown; findings below may reference images not displayed]

FINDINGS: Stable liver cysts. Gallbladder not identified. Moderate
intrahepatic biliary dilatation involving both the right and left
lobes. Common bile duct measures 13 mm.

Spleen is normal. Pancreas is normal. Adrenal glands are normal.
Left kidney is normal. 3 mm low-attenuation lesion anterior cortex
midpole right kidney too small to characterize but likely a cyst.
Bilateral prominence of the renal pelvises increased when compared
02/22/2014.

There are several loops of mildly prominent small bowel that show
air-fluid levels in the mid abdomen. These measure up to about 4 cm
in diameter. They are less dilated than they were previously.
However, oral contrast extends well beyond these loops into the
proximal descending colon. Mid and distal sigmoid colon demonstrates
evidence of wall thickening. There are also several loops of small
bowel in the anterior inferior pelvis that are angulated and which
demonstrate wall thickening. Some of these loops are narrowed. There
is surrounding increased attenuation in the mesentery and there is a
rim enhancing fluid collection inferior and lateral to these loops
on the right side measuring 44 x 35 mm. The partially calcified mass
involving the anterior pelvic wall at this level has been at least
partially removed with some remaining calcifications and thickening
of the anterior pelvic wall at this level. Ileal conduit and
ileostomy located about 8 cm cranial to the abscess, as seen on the
prior study.

Mild inflammatory change in the presacral space is stable. There is
no free fluid. There is no free air.

The abdominal aorta is calcified. There are no acute musculoskeletal
findings.
IMPRESSION: Chronically dilated small bowel again suggesting chronic partial
mechanical obstruction, but contrast extends well into the colon.
New from the prior study is a probable abscess in the inferior right
pelvis, inferior to several loops of inflamed small bowel and
mesentery. The nearby sigmoid colon also appears to be inflamed.

## 2015-11-29 ENCOUNTER — Encounter: Payer: Self-pay | Admitting: Family Medicine

## 2015-12-08 DIAGNOSIS — C679 Malignant neoplasm of bladder, unspecified: Secondary | ICD-10-CM | POA: Diagnosis not present

## 2015-12-08 DIAGNOSIS — Z936 Other artificial openings of urinary tract status: Secondary | ICD-10-CM | POA: Diagnosis not present

## 2015-12-11 ENCOUNTER — Telehealth: Payer: Self-pay | Admitting: Family Medicine

## 2015-12-12 ENCOUNTER — Telehealth: Payer: Self-pay | Admitting: *Deleted

## 2015-12-12 NOTE — Telephone Encounter (Signed)
Received call from patient.   Requested refill on Oxycodone and MS Contin.   Ok to refill??  Last office visit 11/10/2015.  Last refill 11/06/2015 on both.

## 2015-12-12 NOTE — Telephone Encounter (Signed)
Okay to refill? 

## 2015-12-13 MED ORDER — OXYCODONE HCL 5 MG PO TABS: 5.0000 mg | ORAL_TABLET | Freq: Four times a day (QID) | ORAL | Status: DC | PRN

## 2015-12-13 MED ORDER — MORPHINE SULFATE ER 15 MG PO TBCR: 15.0000 mg | EXTENDED_RELEASE_TABLET | Freq: Two times a day (BID) | ORAL | Status: DC

## 2015-12-13 NOTE — Telephone Encounter (Signed)
Prescription printed and patient made aware to come to office to pick up after 2pm on 12/13/2015. 

## 2015-12-26 ENCOUNTER — Emergency Department (HOSPITAL_COMMUNITY): Payer: Medicare Other

## 2015-12-26 ENCOUNTER — Encounter (HOSPITAL_COMMUNITY): Payer: Self-pay | Admitting: Emergency Medicine

## 2015-12-26 ENCOUNTER — Emergency Department (HOSPITAL_COMMUNITY)
Admission: EM | Admit: 2015-12-26 | Discharge: 2015-12-26 | Disposition: A | Discharge status: Home / Self Care | Payer: Medicare Other | Attending: Emergency Medicine | Admitting: Emergency Medicine

## 2015-12-26 DIAGNOSIS — Z79899 Other long term (current) drug therapy: Secondary | ICD-10-CM | POA: Insufficient documentation

## 2015-12-26 DIAGNOSIS — I10 Essential (primary) hypertension: Secondary | ICD-10-CM | POA: Diagnosis not present

## 2015-12-26 DIAGNOSIS — Z87891 Personal history of nicotine dependence: Secondary | ICD-10-CM | POA: Diagnosis not present

## 2015-12-26 DIAGNOSIS — E785 Hyperlipidemia, unspecified: Secondary | ICD-10-CM | POA: Diagnosis not present

## 2015-12-26 DIAGNOSIS — E039 Hypothyroidism, unspecified: Secondary | ICD-10-CM | POA: Insufficient documentation

## 2015-12-26 DIAGNOSIS — M25512 Pain in left shoulder: Secondary | ICD-10-CM | POA: Diagnosis not present

## 2015-12-26 DIAGNOSIS — S4992XA Unspecified injury of left shoulder and upper arm, initial encounter: Secondary | ICD-10-CM | POA: Diagnosis not present

## 2015-12-26 MED ORDER — METHOCARBAMOL 500 MG PO TABS: 500.0000 mg | ORAL_TABLET | Freq: Two times a day (BID) | ORAL | Status: DC

## 2015-12-26 NOTE — ED Notes (Signed)
Pt states she fell 2 weeks ago, but was feeling better then push mowed her grass on Thursday.  States she began having left shoulder pain after mowing and hurts to move or use left arm.

## 2015-12-26 NOTE — ED Provider Notes (Signed)
CSN: [id number]     Arrival date & time 12/26/15  [id number] History   First MD Initiated Contact with Patient 12/26/15 [phone]     Chief Complaint  Patient presents with  . Shoulder Pain     (Consider location/radiation/quality/duration/timing/severity/associated sxs/prior Treatment) HPI Comments: Patient presents emergency department with chief complaint of left shoulder pain. She states that she sustained a mechanical fall about 2 weeks ago. She states that she has been having persistent pain since then. She has been trying to exercise her shoulder and has had good days and bad days. She states that she awoke this morning with increased left shoulder pain.  She thinks that she may have worsened her symptoms while cutting the grass on Thursday. Her symptoms are worsened with movement and palpation of the shoulder and left upper trapezius.  The history is provided by the patient. No language interpreter was used.    Past Medical History  Diagnosis Date  . Bowel obstruction (HCC) several    Recurrent SBO secondary to adhesions  . Hypothyroidism   . Acid reflux   . Chronic back pain   . Hyperlipidemia   . Hypertension     pt says taken off medication since has lost weight.  . Cancer [facility]) 2011 [facility])    diagnosed in 2009 per pt. Invasive High grade Urothelial carcinoma- s/p radiation and Chemo   . UTI (lower urinary tract infection)     Recurrent  . Malnutrition ([place])   . Leukopenia 12/06/2011    HIV serology negative  . Fatty liver   . DVT (deep venous thrombosis) ([place]) 2015  . PE (pulmonary embolism)     PER [facility] records  . Rectal ulcer 2016    [facility]  . Internal hemorrhoids    Past Surgical History  Procedure Laterality Date  . Back surgery      X2  . Hernia repair      mesh  . Abdominal surgery      with intestinal "puncture" x 2, exploratory surgery   . Cholecystectomy    . Ileo conduit      For bladder cancer  . Cystectomy      breast  . Portacath  placement    . Bladder removal     Family History  Problem Relation Age of Onset  . Heart disease Mother     enlarged  . Hypertension Mother   . Diabetes Father   . Diabetes Sister   . Diabetes Maternal Grandmother   . Colon cancer Neg Hx    Social History  Substance Use Topics  . Smoking status: Former Smoker    Quit date: 09/26/2010  . Smokeless tobacco: Never Used  . Alcohol Use: No   OB History    No data available     Review of Systems  Musculoskeletal: Positive for myalgias and arthralgias.  All other systems reviewed and are negative.     Allergies  Bactrim; Cefixime; Codeine; Lactose; Nitrofuran derivatives; Penicillins; and Sulfa antibiotics  Home Medications   Prior to Admission medications   Medication Sig Start Date End Date Taking? Authorizing Provider  DEXILANT 60 MG capsule TAKE ONE CAPSULE BY MOUTH EVERY DAY 05/30/15  Yes Alycia Rossetti, MD  ELIQUIS 5 MG TABS tablet TAKE ONE TABLET BY MOUTH TWICE DAILY 11/17/15  Yes Alycia Rossetti, MD  furosemide (LASIX) 40 MG tablet Take 1 tablet (40 mg total) by mouth 2 (two) times daily. Patient taking differently: Take 40 mg by  mouth 2 (two) times daily as needed for fluid.  08/01/15  Yes Alycia Rossetti, MD  hydrocortisone-pramoxine (PROCTOFOAM [facility]) rectal foam Place 1 applicator rectally 2 (two) times daily as needed for hemorrhoids or itching. 11/23/15  Yes Butch Penny, NP  levothyroxine (SYNTHROID, LEVOTHROID) 100 MCG tablet Take 1 tablet (100 mcg total) by mouth daily. 11/17/15  Yes Alycia Rossetti, MD  morphine (MS CONTIN) 15 MG 12 hr tablet Take 1 tablet (15 mg total) by mouth every 12 (twelve) hours. 12/13/15  Yes Alycia Rossetti, MD  oxyCODONE (OXY IR/ROXICODONE) 5 MG immediate release tablet Take 1 tablet (5 mg total) by mouth every 6 (six) hours as needed for severe pain. 12/13/15  Yes Alycia Rossetti, MD  potassium chloride (KLOR-CON 10) 10 MEQ tablet Take 1 tablet (10 mEq total) by mouth  daily. Patient taking differently: Take 10 mEq by mouth daily as needed (tales with lasix as needed).  08/04/15  Yes Alycia Rossetti, MD  pravastatin (PRAVACHOL) 20 MG tablet TAKE ONE TABLET BY MOUTH DAILY FOR CHOLESTEROL 11/10/15  Yes Alycia Rossetti, MD  methocarbamol (ROBAXIN) 500 MG tablet Take 1 tablet (500 mg total) by mouth 2 (two) times daily. 12/26/15   Montine Circle, PA-C   BP 152/99 mmHg  Pulse 95  Temp(Src) 97.8 F (36.6 C) (Oral)  Resp 18  Ht 5\' 5"  (1.651 m)  Wt 81.647 kg  BMI 29.95 kg/m2  SpO2 100% Physical Exam  Constitutional: She is oriented to person, place, and time. She appears well-developed and well-nourished.  HENT:  Head: Normocephalic and atraumatic.  Eyes: Conjunctivae and EOM are normal. Pupils are equal, round, and reactive to light.  Neck: Normal range of motion. Neck supple.  Cardiovascular: Normal rate.   Pulmonary/Chest: Effort normal and breath sounds normal. No respiratory distress.  Abdominal: Soft. Bowel sounds are normal. She exhibits no distension.  Musculoskeletal: Normal range of motion. She exhibits no edema or tenderness.  Left upper trapezius is very tender to palpation, there is no bony abnormality or deformity of the left shoulder, range of motion and strength is limited above 30 abduction secondary to pain  No cervical, thoracic, or lumbar spine tenderness.  Neurological: She is alert and oriented to person, place, and time.  Skin: Skin is warm and dry.  Psychiatric: She has a normal mood and affect. Her behavior is normal. Judgment and thought content normal.  Nursing note and vitals reviewed.   ED Course  Procedures (including critical care time)  Imaging Review Dg Shoulder Left  12/26/2015  CLINICAL DATA:  Pain following fall 2 weeks prior EXAM: LEFT SHOULDER - 2+ VIEW COMPARISON:  None. FINDINGS: Frontal, Y scapular, and axillary images were obtained. There is no demonstrable fracture or dislocation. There is mild generalized  osteoarthritic change. No erosive change or intra-articular calcification. Visualized left lung clear. IMPRESSION: Mild generalized osteoarthritic change.  No fracture or dislocation. Electronically Signed   By: Lowella Grip III M.D.   On: 12/26/2015 10:37   I have personally reviewed and evaluated these images and lab results as part of my medical decision-making.   MDM   Final diagnoses:  Left shoulder pain    Patient with left shoulder injury. Concern for shoulder impingement syndrome versus rotator cuff injury. Recommend orthopedic follow-up. Patient requests additional medication for her discomfort, however she has already taking morphine, oxycodone, and Flexeril. I do not feel that additional narcotics are indicated in this situation, however I will switch her from Flexeril  to Robaxin and provide a sling. She is strongly encouraged to do range of motion activities daily to prevent adhesive capsulitis.    Montine Circle, PA-C 12/26/15 1104  Merrily Pew, MD 12/26/15 1500

## 2015-12-26 NOTE — Discharge Instructions (Signed)
It is imperative that you take your arm out of the sling several times and day and do some bent over arm circles or range of motion activities to prevent frozen shoulder.  Impingement Syndrome, Rotator Cuff, Bursitis With Rehab Impingement syndrome is a condition that involves inflammation of the tendons of the rotator cuff and the subacromial bursa, that causes pain in the shoulder. The rotator cuff consists of four tendons and muscles that control much of the shoulder and upper arm function. The subacromial bursa is a fluid filled sac that helps reduce friction between the rotator cuff and one of the bones of the shoulder (acromion). Impingement syndrome is usually an overuse injury that causes swelling of the bursa (bursitis), swelling of the tendon (tendonitis), and/or a tear of the tendon (strain). Strains are classified into three categories. Grade 1 strains cause pain, but the tendon is not lengthened. Grade 2 strains include a lengthened ligament, due to the ligament being stretched or partially ruptured. With grade 2 strains there is still function, although the function may be decreased. Grade 3 strains include a complete tear of the tendon or muscle, and function is usually impaired. SYMPTOMS   Pain around the shoulder, often at the outer portion of the upper arm.  Pain that gets worse with shoulder function, especially when reaching overhead or lifting.  Sometimes, aching when not using the arm.  Pain that wakes you up at night.  Sometimes, tenderness, swelling, warmth, or redness over the affected area.  Loss of strength.  Limited motion of the shoulder, especially reaching behind the back (to the back pocket or to unhook bra) or across your body.  Crackling sound (crepitation) when moving the arm.  Biceps tendon pain and inflammation (in the front of the shoulder). Worse when bending the elbow or lifting. CAUSES  Impingement syndrome is often an overuse injury, in which chronic  (repetitive) motions cause the tendons or bursa to become inflamed. A strain occurs when a force is paced on the tendon or muscle that is greater than it can withstand. Common mechanisms of injury include: Stress from sudden increase in duration, frequency, or intensity of training.  Direct hit (trauma) to the shoulder.  Aging, erosion of the tendon with normal use.  Bony bump on shoulder (acromial spur). RISK INCREASES WITH:  Contact sports (football, wrestling, boxing).  Throwing sports (baseball, tennis, volleyball).  Weightlifting and bodybuilding.  Heavy labor.  Previous injury to the rotator cuff, including impingement.  Poor shoulder strength and flexibility.  Failure to warm up properly before activity.  Inadequate protective equipment.  Old age.  Bony bump on shoulder (acromial spur). PREVENTION   Warm up and stretch properly before activity.  Allow for adequate recovery between workouts.  Maintain physical fitness:  Strength, flexibility, and endurance.  Cardiovascular fitness.  Learn and use proper exercise technique. PROGNOSIS  If treated properly, impingement syndrome usually goes away within 6 weeks. Sometimes surgery is required.  RELATED COMPLICATIONS   Longer healing time if not properly treated, or if not given enough time to heal.  Recurring symptoms, that result in a chronic condition.  Shoulder stiffness, frozen shoulder, or loss of motion.  Rotator cuff tendon tear.  Recurring symptoms, especially if activity is resumed too soon, with overuse, with a direct blow, or when using poor technique. TREATMENT  Treatment first involves the use of ice and medicine, to reduce pain and inflammation. The use of strengthening and stretching exercises may help reduce pain with activity. These exercises may  be performed at home or with a therapist. If non-surgical treatment is unsuccessful after more than 6 months, surgery may be advised. After surgery  and rehabilitation, activity is usually possible in 3 months.  MEDICATION  If pain medicine is needed, nonsteroidal anti-inflammatory medicines (aspirin and ibuprofen), or other minor pain relievers (acetaminophen), are often advised.  Do not take pain medicine for 7 days before surgery.  Prescription pain relievers may be given, if your caregiver thinks they are needed. Use only as directed and only as much as you need.  Corticosteroid injections may be given by your caregiver. These injections should be reserved for the most serious cases, because they may only be given a certain number of times. HEAT AND COLD  Cold treatment (icing) should be applied for 10 to 15 minutes every 2 to 3 hours for inflammation and pain, and immediately after activity that aggravates your symptoms. Use ice packs or an ice massage.  Heat treatment may be used before performing stretching and strengthening activities prescribed by your caregiver, physical therapist, or athletic trainer. Use a heat pack or a warm water soak. SEEK MEDICAL CARE IF:   Symptoms get worse or do not improve in 4 to 6 weeks, despite treatment.  New, unexplained symptoms develop. (Drugs used in treatment may produce side effects.) EXERCISES  RANGE OF MOTION (ROM) AND STRETCHING EXERCISES - Impingement Syndrome (Rotator Cuff  Tendinitis, Bursitis) These exercises may help you when beginning to rehabilitate your injury. Your symptoms may go away with or without further involvement from your physician, physical therapist or athletic trainer. While completing these exercises, remember:   Restoring tissue flexibility helps normal motion to return to the joints. This allows healthier, less painful movement and activity.  An effective stretch should be held for at least 30 seconds.  A stretch should never be painful. You should only feel a gentle lengthening or release in the stretched tissue. STRETCH - Flexion, Standing  Stand with good  posture. With an underhand grip on your right / left hand, and an overhand grip on the opposite hand, grasp a broomstick or cane so that your hands are a little more than shoulder width apart.  Keeping your right / left elbow straight and shoulder muscles relaxed, push the stick with your opposite hand, to raise your right / left arm in front of your body and then overhead. Raise your arm until you feel a stretch in your right / left shoulder, but before you have increased shoulder pain.  Try to avoid shrugging your right / left shoulder as your arm rises, by keeping your shoulder blade tucked down and toward your mid-back spine. Hold for __________ seconds.  Slowly return to the starting position. Repeat __________ times. Complete this exercise __________ times per day. STRETCH - Abduction, Supine  Lie on your back. With an underhand grip on your right / left hand and an overhand grip on the opposite hand, grasp a broomstick or cane so that your hands are a little more than shoulder width apart.  Keeping your right / left elbow straight and your shoulder muscles relaxed, push the stick with your opposite hand, to raise your right / left arm out to the side of your body and then overhead. Raise your arm until you feel a stretch in your right / left shoulder, but before you have increased shoulder pain.  Try to avoid shrugging your right / left shoulder as your arm rises, by keeping your shoulder blade tucked down and  toward your mid-back spine. Hold for __________ seconds.  Slowly return to the starting position. Repeat __________ times. Complete this exercise __________ times per day. ROM - Flexion, Active-Assisted  Lie on your back. You may bend your knees for comfort.  Grasp a broomstick or cane so your hands are about shoulder width apart. Your right / left hand should grip the end of the stick, so that your hand is positioned "thumbs-up," as if you were about to shake hands.  Using your  healthy arm to lead, raise your right / left arm overhead, until you feel a gentle stretch in your shoulder. Hold for __________ seconds.  Use the stick to assist in returning your right / left arm to its starting position. Repeat __________ times. Complete this exercise __________ times per day.  ROM - Internal Rotation, Supine   Lie on your back on a firm surface. Place your right / left elbow about 60 degrees away from your side. Elevate your elbow with a folded towel, so that the elbow and shoulder are the same height.  Using a broomstick or cane and your strong arm, pull your right / left hand toward your body until you feel a gentle stretch, but no increase in your shoulder pain. Keep your shoulder and elbow in place throughout the exercise.  Hold for __________ seconds. Slowly return to the starting position. Repeat __________ times. Complete this exercise __________ times per day. STRETCH - Internal Rotation  Place your right / left hand behind your back, palm up.  Throw a towel or belt over your opposite shoulder. Grasp the towel with your right / left hand.  While keeping an upright posture, gently pull up on the towel, until you feel a stretch in the front of your right / left shoulder.  Avoid shrugging your right / left shoulder as your arm rises, by keeping your shoulder blade tucked down and toward your mid-back spine.  Hold for __________ seconds. Release the stretch, by lowering your healthy hand. Repeat __________ times. Complete this exercise __________ times per day. ROM - Internal Rotation   Using an underhand grip, grasp a stick behind your back with both hands.  While standing upright with good posture, slide the stick up your back until you feel a mild stretch in the front of your shoulder.  Hold for __________ seconds. Slowly return to your starting position. Repeat __________ times. Complete this exercise __________ times per day.  STRETCH - Posterior Shoulder  Capsule   Stand or sit with good posture. Grasp your right / left elbow and draw it across your chest, keeping it at the same height as your shoulder.  Pull your elbow, so your upper arm comes in closer to your chest. Pull until you feel a gentle stretch in the back of your shoulder.  Hold for __________ seconds. Repeat __________ times. Complete this exercise __________ times per day. STRENGTHENING EXERCISES - Impingement Syndrome (Rotator Cuff Tendinitis, Bursitis) These exercises may help you when beginning to rehabilitate your injury. They may resolve your symptoms with or without further involvement from your physician, physical therapist or athletic trainer. While completing these exercises, remember:  Muscles can gain both the endurance and the strength needed for everyday activities through controlled exercises.  Complete these exercises as instructed by your physician, physical therapist or athletic trainer. Increase the resistance and repetitions only as guided.  You may experience muscle soreness or fatigue, but the pain or discomfort you are trying to eliminate should never worsen  during these exercises. If this pain does get worse, stop and make sure you are following the directions exactly. If the pain is still present after adjustments, discontinue the exercise until you can discuss the trouble with your clinician.  During your recovery, avoid activity or exercises which involve actions that place your injured hand or elbow above your head or behind your back or head. These positions stress the tissues which you are trying to heal. STRENGTH - Scapular Depression and Adduction   With good posture, sit on a firm chair. Support your arms in front of you, with pillows, arm rests, or on a table top. Have your elbows in line with the sides of your body.  Gently draw your shoulder blades down and toward your mid-back spine. Gradually increase the tension, without tensing the muscles  along the top of your shoulders and the back of your neck.  Hold for __________ seconds. Slowly release the tension and relax your muscles completely before starting the next repetition.  After you have practiced this exercise, remove the arm support and complete the exercise in standing as well as sitting position. Repeat __________ times. Complete this exercise __________ times per day.  STRENGTH - Shoulder Abductors, Isometric  With good posture, stand or sit about 4-6 inches from a wall, with your right / left side facing the wall.  Bend your right / left elbow. Gently press your right / left elbow into the wall. Increase the pressure gradually, until you are pressing as hard as you can, without shrugging your shoulder or increasing any shoulder discomfort.  Hold for __________ seconds.  Release the tension slowly. Relax your shoulder muscles completely before you begin the next repetition. Repeat __________ times. Complete this exercise __________ times per day.  STRENGTH - External Rotators, Isometric  Keep your right / left elbow at your side and bend it 90 degrees.  Step into a door frame so that the outside of your right / left wrist can press against the door frame without your upper arm leaving your side.  Gently press your right / left wrist into the door frame, as if you were trying to swing the back of your hand away from your stomach. Gradually increase the tension, until you are pressing as hard as you can, without shrugging your shoulder or increasing any shoulder discomfort.  Hold for __________ seconds.  Release the tension slowly. Relax your shoulder muscles completely before you begin the next repetition. Repeat __________ times. Complete this exercise __________ times per day.  STRENGTH - Supraspinatus   Stand or sit with good posture. Grasp a __________ weight, or an exercise band or tubing, so that your hand is "thumbs-up," like you are shaking hands.  Slowly  lift your right / left arm in a "V" away from your thigh, diagonally into the space between your side and straight ahead. Lift your hand to shoulder height or as far as you can, without increasing any shoulder pain. At first, many people do not lift their hands above shoulder height.  Avoid shrugging your right / left shoulder as your arm rises, by keeping your shoulder blade tucked down and toward your mid-back spine.  Hold for __________ seconds. Control the descent of your hand, as you slowly return to your starting position. Repeat __________ times. Complete this exercise __________ times per day.  STRENGTH - External Rotators  Secure a rubber exercise band or tubing to a fixed object (table, pole) so that it is at the same  height as your right / left elbow when you are standing or sitting on a firm surface.  Stand or sit so that the secured exercise band is at your uninjured side.  Bend your right / left elbow 90 degrees. Place a folded towel or small pillow under your right / left arm, so that your elbow is a few inches away from your side.  Keeping the tension on the exercise band, pull it away from your body, as if pivoting on your elbow. Be sure to keep your body steady, so that the movement is coming only from your rotating shoulder.  Hold for __________ seconds. Release the tension in a controlled manner, as you return to the starting position. Repeat __________ times. Complete this exercise __________ times per day.  STRENGTH - Internal Rotators   Secure a rubber exercise band or tubing to a fixed object (table, pole) so that it is at the same height as your right / left elbow when you are standing or sitting on a firm surface.  Stand or sit so that the secured exercise band is at your right / left side.  Bend your elbow 90 degrees. Place a folded towel or small pillow under your right / left arm so that your elbow is a few inches away from your side.  Keeping the tension on the  exercise band, pull it across your body, toward your stomach. Be sure to keep your body steady, so that the movement is coming only from your rotating shoulder.  Hold for __________ seconds. Release the tension in a controlled manner, as you return to the starting position. Repeat __________ times. Complete this exercise __________ times per day.  STRENGTH - Scapular Protractors, Standing   Stand arms length away from a wall. Place your hands on the wall, keeping your elbows straight.  Begin by dropping your shoulder blades down and toward your mid-back spine.  To strengthen your protractors, keep your shoulder blades down, but slide them forward on your rib cage. It will feel as if you are lifting the back of your rib cage away from the wall. This is a subtle motion and can be challenging to complete. Ask your caregiver for further instruction, if you are not sure you are doing the exercise correctly.  Hold for __________ seconds. Slowly return to the starting position, resting the muscles completely before starting the next repetition. Repeat __________ times. Complete this exercise __________ times per day. STRENGTH - Scapular Protractors, Supine  Lie on your back on a firm surface. Extend your right / left arm straight into the air while holding a __________ weight in your hand.  Keeping your head and back in place, lift your shoulder off the floor.  Hold for __________ seconds. Slowly return to the starting position, and allow your muscles to relax completely before starting the next repetition. Repeat __________ times. Complete this exercise __________ times per day. STRENGTH - Scapular Protractors, Quadruped  Get onto your hands and knees, with your shoulders directly over your hands (or as close as you can be, comfortably).  Keeping your elbows locked, lift the back of your rib cage up into your shoulder blades, so your mid-back rounds out. Keep your neck muscles relaxed.  Hold  this position for __________ seconds. Slowly return to the starting position and allow your muscles to relax completely before starting the next repetition. Repeat __________ times. Complete this exercise __________ times per day.  STRENGTH - Scapular Retractors  Secure a rubber exercise  band or tubing to a fixed object (table, pole), so that it is at the height of your shoulders when you are either standing, or sitting on a firm armless chair.  With a palm down grip, grasp an end of the band in each hand. Straighten your elbows and lift your hands straight in front of you, at shoulder height. Step back, away from the secured end of the band, until it becomes tense.  Squeezing your shoulder blades together, draw your elbows back toward your sides, as you bend them. Keep your upper arms lifted away from your body throughout the exercise.  Hold for __________ seconds. Slowly ease the tension on the band, as you reverse the directions and return to the starting position. Repeat __________ times. Complete this exercise __________ times per day. STRENGTH - Shoulder Extensors   Secure a rubber exercise band or tubing to a fixed object (table, pole) so that it is at the height of your shoulders when you are either standing, or sitting on a firm armless chair.  With a thumbs-up grip, grasp an end of the band in each hand. Straighten your elbows and lift your hands straight in front of you, at shoulder height. Step back, away from the secured end of the band, until it becomes tense.  Squeezing your shoulder blades together, pull your hands down to the sides of your thighs. Do not allow your hands to go behind you.  Hold for __________ seconds. Slowly ease the tension on the band, as you reverse the directions and return to the starting position. Repeat __________ times. Complete this exercise __________ times per day.  STRENGTH - Scapular Retractors and External Rotators   Secure a rubber exercise  band or tubing to a fixed object (table, pole) so that it is at the height as your shoulders, when you are either standing, or sitting on a firm armless chair.  With a palm down grip, grasp an end of the band in each hand. Bend your elbows 90 degrees and lift your elbows to shoulder height, at your sides. Step back, away from the secured end of the band, until it becomes tense.  Squeezing your shoulder blades together, rotate your shoulders so that your upper arms and elbows remain stationary, but your fists travel upward to head height.  Hold for __________ seconds. Slowly ease the tension on the band, as you reverse the directions and return to the starting position. Repeat __________ times. Complete this exercise __________ times per day.  STRENGTH - Scapular Retractors and External Rotators, Rowing   Secure a rubber exercise band or tubing to a fixed object (table, pole) so that it is at the height of your shoulders, when you are either standing, or sitting on a firm armless chair.  With a palm down grip, grasp an end of the band in each hand. Straighten your elbows and lift your hands straight in front of you, at shoulder height. Step back, away from the secured end of the band, until it becomes tense.  Step 1: Squeeze your shoulder blades together. Bending your elbows, draw your hands to your chest, as if you are rowing a boat. At the end of this motion, your hands and elbow should be at shoulder height and your elbows should be out to your sides.  Step 2: Rotate your shoulders, to raise your hands above your head. Your forearms should be vertical and your upper arms should be horizontal.  Hold for __________ seconds. Slowly ease the tension  on the band, as you reverse the directions and return to the starting position. Repeat __________ times. Complete this exercise __________ times per day.  STRENGTH - Scapular Depressors  Find a sturdy chair without wheels, such as a dining room  chair.  Keeping your feet on the floor, and your hands on the chair arms, lift your bottom up from the seat, and lock your elbows.  Keeping your elbows straight, allow gravity to pull your body weight down. Your shoulders will rise toward your ears.  Raise your body against gravity by drawing your shoulder blades down your back, shortening the distance between your shoulders and ears. Although your feet should always maintain contact with the floor, your feet should progressively support less body weight, as you get stronger.  Hold for __________ seconds. In a controlled and slow manner, lower your body weight to begin the next repetition. Repeat __________ times. Complete this exercise __________ times per day.    This information is not intended to replace advice given to you by your health care provider. Make sure you discuss any questions you have with your health care provider.   Document Released: 06/17/2005 Document Revised: 07/08/2014 Document Reviewed: 09/29/2008 [employer] Interactive Patient Education [employer].

## 2016-01-11 DIAGNOSIS — Z936 Other artificial openings of urinary tract status: Secondary | ICD-10-CM | POA: Diagnosis not present

## 2016-01-11 DIAGNOSIS — C679 Malignant neoplasm of bladder, unspecified: Secondary | ICD-10-CM | POA: Diagnosis not present

## 2016-01-15 DIAGNOSIS — C679 Malignant neoplasm of bladder, unspecified: Secondary | ICD-10-CM | POA: Diagnosis not present

## 2016-01-15 DIAGNOSIS — Z936 Other artificial openings of urinary tract status: Secondary | ICD-10-CM | POA: Diagnosis not present

## 2016-01-19 ENCOUNTER — Other Ambulatory Visit: Payer: Self-pay | Admitting: Family Medicine

## 2016-01-19 MED ORDER — MORPHINE SULFATE ER 15 MG PO TBCR: 15.0000 mg | EXTENDED_RELEASE_TABLET | Freq: Two times a day (BID) | ORAL | Status: DC

## 2016-01-19 MED ORDER — OXYCODONE HCL 5 MG PO TABS: 5.0000 mg | ORAL_TABLET | Freq: Four times a day (QID) | ORAL | Status: DC | PRN

## 2016-01-19 NOTE — Telephone Encounter (Signed)
Patient needs rx for her oxycodone and morphine  [phone]

## 2016-01-19 NOTE — Telephone Encounter (Signed)
okay

## 2016-01-19 NOTE — Telephone Encounter (Signed)
rx's are ready and pt aware

## 2016-01-19 NOTE — Telephone Encounter (Signed)
Both refilled 12/13/15 for 1 month.  OK refill?

## 2016-01-24 ENCOUNTER — Encounter: Payer: Self-pay | Admitting: Family Medicine

## 2016-01-31 ENCOUNTER — Ambulatory Visit (INDEPENDENT_AMBULATORY_CARE_PROVIDER_SITE_OTHER): Payer: Medicare Other | Admitting: Family Medicine

## 2016-01-31 ENCOUNTER — Encounter: Payer: Self-pay | Admitting: Family Medicine

## 2016-01-31 VITALS — BP 120/88 | HR 88 | Temp 97.7°F | Resp 16 | Ht 65.0 in | Wt 180.0 lb

## 2016-01-31 DIAGNOSIS — R300 Dysuria: Secondary | ICD-10-CM | POA: Diagnosis not present

## 2016-01-31 DIAGNOSIS — E038 Other specified hypothyroidism: Secondary | ICD-10-CM | POA: Diagnosis not present

## 2016-01-31 DIAGNOSIS — N390 Urinary tract infection, site not specified: Secondary | ICD-10-CM

## 2016-01-31 DIAGNOSIS — E785 Hyperlipidemia, unspecified: Secondary | ICD-10-CM | POA: Diagnosis not present

## 2016-01-31 LAB — CBC WITH DIFFERENTIAL/PLATELET
BASOS PCT: 0 %
Basophils Absolute: 0 cells/uL (ref 0–200)
Eosinophils Absolute: 178 cells/uL (ref 15–500)
Eosinophils Relative: 2 %
HCT: 39.4 % (ref 35.0–45.0)
Hemoglobin: 12.6 g/dL (ref 12.0–15.0)
LYMPHS PCT: 17 %
Lymphs Abs: 1513 cells/uL (ref 850–3900)
MCH: 24.6 pg — ABNORMAL LOW (ref 27.0–33.0)
MCHC: 32 g/dL (ref 32.0–36.0)
MCV: 76.8 fL — ABNORMAL LOW (ref 80.0–100.0)
MONOS PCT: 7 %
MPV: 9.6 fL (ref 7.5–12.5)
Monocytes Absolute: 623 cells/uL (ref 200–950)
Neutro Abs: 6586 cells/uL (ref 1500–7800)
Neutrophils Relative %: 74 %
PLATELETS: 299 10*3/uL (ref 140–400)
RBC: 5.13 MIL/uL — AB (ref 3.80–5.10)
RDW: 18.1 % — AB (ref 11.0–15.0)
WBC: 8.9 10*3/uL (ref 3.8–10.8)

## 2016-01-31 LAB — URINALYSIS, ROUTINE W REFLEX MICROSCOPIC
BILIRUBIN URINE: NEGATIVE
Glucose, UA: NEGATIVE
KETONES UR: NEGATIVE
Nitrite: POSITIVE — AB
PH: 6 (ref 5.0–8.0)
SPECIFIC GRAVITY, URINE: 1.015 (ref 1.001–1.035)

## 2016-01-31 LAB — T4, FREE: Free T4: 1 ng/dL (ref 0.8–1.8)

## 2016-01-31 LAB — URINALYSIS, MICROSCOPIC ONLY
CRYSTALS: NONE SEEN [HPF]
Yeast: NONE SEEN [HPF]

## 2016-01-31 LAB — BASIC METABOLIC PANEL
BUN: 16 mg/dL (ref 7–25)
CO2: 26 mmol/L (ref 20–31)
CREATININE: 1.17 mg/dL — AB (ref 0.50–0.99)
Calcium: 8.8 mg/dL (ref 8.6–10.4)
Chloride: 105 mmol/L (ref 98–110)
Glucose, Bld: 98 mg/dL (ref 70–99)
Potassium: 3.4 mmol/L — ABNORMAL LOW (ref 3.5–5.3)
Sodium: 141 mmol/L (ref 135–146)

## 2016-01-31 LAB — LIPID PANEL
Cholesterol: 147 mg/dL (ref 125–200)
HDL: 61 mg/dL (ref >=46)
LDL Cholesterol: 57 mg/dL (ref <130)
TRIGLYCERIDES: 147 mg/dL (ref <150)
Total CHOL/HDL Ratio: 2.4 Ratio (ref <=5.0)
VLDL: 29 mg/dL (ref <30)

## 2016-01-31 LAB — TSH: TSH: 16.69 m[IU]/L — AB

## 2016-01-31 LAB — T3, FREE: T3 FREE: 2.4 pg/mL (ref 2.3–4.2)

## 2016-01-31 NOTE — Progress Notes (Signed)
   Subjective:    Patient ID: [name], female    DOB: [birth year], 65 y.o.   MRN: [id number]  Patient presents for Dysuria (x1 week- dark colored urine, maldorous urine)  Patient here without odor from her urostomy for the past week. She's not had any fever no nausea vomiting no chills. She been doing well until yesterday when she ate some tomatoes in it grow which causes her to have diarrhea again. She still gets chronic rectal pain and bleeding on and off depending on what she eats. She was just concerned because of the odor from her bag.  She is overdue for repeat thyroid function change her Synthroid to 100 g earlier this year   Review Of Systems:  GEN- denies fatigue, fever, weight loss,weakness, recent illness HEENT- denies eye drainage, change in vision, nasal discharge, CVS- denies chest pain, palpitations RESP- denies SOB, cough, wheeze ABD- denies N/V, +change in stools, abd pain GU- denies dysuria, hematuria, dribbling, incontinence MSK- denies joint pain, muscle aches, injury Neuro- denies headache, dizziness, syncope, seizure activity       Objective:    BP 120/88 (BP Location: Left Arm, Patient Position: Sitting, Cuff Size: Normal)   Pulse 88   Temp 97.7 F (36.5 C) (Oral)   Resp 16   Ht 5\' 5"  (1.651 m)   Wt 180 lb (81.6 kg)   BMI 29.95 kg/m  GEN- NAD, alert and oriented x3 HEENT- PERRL, EOMI, non injected sclera, pink conjunctiva, MMM, oropharynx clear Neck- Supple, no thyromegaly CVS- RRR, no murmur RESP-CTAB ABD-NABS,soft,NT,ND, urostomy stump pink, urine yellow, mild cloudiness  EXT- No edema Pulses- Radial, DP- 2+        Assessment & Plan:      Problem List Items Addressed This Visit    Recurrent UTI    She recurrent urinary tract infection she's only been treated once this year. However she is colonized so I prefer to look at her cultures to see if there is something new growing and also for her to have some symptoms before just  treating with antibiotics.      Hypothyroidism   Relevant Orders   TSH   T3, free   T4, free   Basic metabolic panel   Hyperlipidemia   Relevant Orders   Basic metabolic panel   CBC with Differential/Platelet   Lipid panel    Other Visit Diagnoses    Dysuria    -  Primary   Relevant Orders   Urinalysis, Routine w reflex microscopic (not at [facility]) (Completed)   Urine culture      Note: This dictation was prepared with Dragon dictation along with smaller phrase technology. Any transcriptional errors that result from this process are unintentional.

## 2016-01-31 NOTE — Assessment & Plan Note (Signed)
She recurrent urinary tract infection she's only been treated once this year. However she is colonized so I prefer to look at her cultures to see if there is something new growing and also for her to have some symptoms before just treating with antibiotics.

## 2016-01-31 NOTE — Patient Instructions (Addendum)
We will call with lab results  F/U 3 months  

## 2016-02-03 LAB — URINE CULTURE: Colony Count: >=100000

## 2016-02-07 ENCOUNTER — Other Ambulatory Visit: Payer: Self-pay | Admitting: *Deleted

## 2016-02-07 MED ORDER — LEVOTHYROXINE SODIUM 112 MCG PO TABS: 112.0000 ug | ORAL_TABLET | Freq: Every day | ORAL | 3 refills | Status: DC

## 2016-02-14 DIAGNOSIS — Z936 Other artificial openings of urinary tract status: Secondary | ICD-10-CM | POA: Diagnosis not present

## 2016-02-14 DIAGNOSIS — C679 Malignant neoplasm of bladder, unspecified: Secondary | ICD-10-CM | POA: Diagnosis not present

## 2016-02-21 ENCOUNTER — Telehealth: Payer: Self-pay | Admitting: Family Medicine

## 2016-02-21 MED ORDER — OXYCODONE HCL 5 MG PO TABS: 5.0000 mg | ORAL_TABLET | Freq: Four times a day (QID) | ORAL | 0 refills | Status: DC | PRN

## 2016-02-21 MED ORDER — MORPHINE SULFATE ER 15 MG PO TBCR: 15.0000 mg | EXTENDED_RELEASE_TABLET | Freq: Two times a day (BID) | ORAL | 0 refills | Status: DC

## 2016-02-21 NOTE — Telephone Encounter (Signed)
Prescription printed and patient made aware to come to office to pick up after 4pm on 02/21/2016.

## 2016-02-21 NOTE — Telephone Encounter (Signed)
Patient calling to get rx for her oxycodone and morphine  Call when ready to pick up  [phone]

## 2016-02-21 NOTE — Telephone Encounter (Signed)
Okay to refill? 

## 2016-02-21 NOTE — Telephone Encounter (Signed)
Ok to refill on both??  Last office visit 01/31/2016.  Last refill 01/19/2016.

## 2016-02-28 ENCOUNTER — Encounter (INDEPENDENT_AMBULATORY_CARE_PROVIDER_SITE_OTHER): Payer: Self-pay | Admitting: Internal Medicine

## 2016-02-28 ENCOUNTER — Ambulatory Visit (INDEPENDENT_AMBULATORY_CARE_PROVIDER_SITE_OTHER): Payer: Medicare Other | Admitting: Internal Medicine

## 2016-02-28 VITALS — BP 142/100 | HR 72 | Temp 98.4°F | Ht 65.5 in | Wt 184.0 lb

## 2016-02-28 DIAGNOSIS — K6289 Other specified diseases of anus and rectum: Secondary | ICD-10-CM

## 2016-02-28 NOTE — Patient Instructions (Signed)
I am going to discuss with Dr. Laural Golden,.

## 2016-02-28 NOTE — Progress Notes (Addendum)
Subjective:    Patient ID: [name], female    DOB: 05-31-1951, 65 y.o.   MRN: [id number]  HPI here today for f/u. Patient has had rectal pain for 20 months. She has Lanocaine  to put on her rectum for pain. She take MS Contin for her rectal pain also.  She has the pain about 4 times a week.  She says when she doesn't have a BM, she does not have pain.  She tells me for the last 3 days she has had diarrhea.  Her appetite is good. She has gained 4 pounds since her last office. Surgery at [facility] for a small bowel obstruction and ended up on TPN for 3 years. After she started eating, is when the pain started.   She has been seen at [facility] and [facility] for this rectal pain.   She averages about 4 stools a day.  Wt 180 at [place] in May Recent sigmoid in March was normal (2017). Hx of cystectomy in 2009 for bladder cancer w/ileal conduit c/b bowel injury requiring bowel resection. 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 Morton Stall which revealed anal condyloma and was excised 02/24/2015. Biopsy benign with hyperkarotosis.   She was referred to [facility] for a second opinion as there has been no other obvious source of her rectal pain.    10/18/2014 Colonoscopy   The colonoscopy was completed to the intended extent. An ileocecal valve photo was captured An appendiceal orifice photo was captured.  There was not adequate preparation of the colon to follow recommended surveillance guidelines.  Colonoscopy Polyp Specimen Collection: No polyps removed.  Biopsy Colonic mucosa with no significant pathologic diagnosis. No evidence of lymphocytic or collagenous colitis. Rectal ulcer, endoscopic biopsy:  Colonic mucosa with focal active cryptitis along with focal smooth muscle hyperplasia and hyalinization of the lamina propria and focal superficial erosion, Findings are suspicious for rectal prolapse. Loose stools, blood in  stool. Per [facility] colonoscopy report:Preparation of the colon was poor.Non-thrombosed external hemorrhoids and internal hemorrhoids (grade 1) found on digital rectal exam.Stool in the entire examined colon.Mucosal ulceration.Biopsied.Non-bleeding internal hemorrhoids.The examination was otherwise      09/04/2015 Flexible sigmoid.:  Anal pain. Dr. Madilyn Fireman. Normal exam.    06/19/2014 colonoscopy: Abnormal barium enema: A few scattered small mouth  Diverticula at sigmoid colon. Review of Systems Past Medical History:  Diagnosis Date  . Acid reflux   . Bowel obstruction (HCC) several   Recurrent SBO secondary to adhesions  . Cancer [facility]) 2011 [facility])   diagnosed in 2009 per pt. Invasive High grade Urothelial carcinoma- s/p radiation and Chemo   . Chronic back pain   . DVT (deep venous thrombosis) ([place]) 2015  . Fatty liver   . Hyperlipidemia   . Hypertension    pt says taken off medication since has lost weight.  . Hypothyroidism   . Internal hemorrhoids   . Leukopenia 12/06/2011   HIV serology negative  . Malnutrition ([place])   . PE (pulmonary embolism)    PER [facility] records  . Rectal ulcer 2016   [facility] Colonoscopy  . UTI (lower urinary tract infection)    Recurrent    Past Surgical History:  Procedure Laterality Date  . ABDOMINAL SURGERY     with intestinal "puncture" x 2, exploratory surgery   . BACK SURGERY     X2  . BLADDER REMOVAL    .  CHOLECYSTECTOMY    . CYSTECTOMY     breast  . HERNIA REPAIR     mesh  . ILEO CONDUIT     For bladder cancer  . PORTACATH PLACEMENT      Allergies  Allergen Reactions  . Bactrim [Sulfamethoxazole-Trimethoprim]   . Cefixime Diarrhea  . Codeine Hives  . Lactose Diarrhea  . Nitrofuran Derivatives Itching  . Penicillins Hives  . Sulfa Antibiotics Hives    Current Outpatient Prescriptions on File Prior to Visit  Medication Sig Dispense Refill  . DEXILANT 60 MG capsule TAKE ONE CAPSULE BY  MOUTH EVERY DAY 90 capsule 3  . ELIQUIS 5 MG TABS tablet TAKE ONE TABLET BY MOUTH TWICE DAILY 60 tablet 3  . furosemide (LASIX) 40 MG tablet Take 1 tablet (40 mg total) by mouth 2 (two) times daily. (Patient taking differently: Take 40 mg by mouth 2 (two) times daily as needed for fluid. ) 180 tablet 0  . hydrocortisone-pramoxine (PROCTOFOAM HC) rectal foam Place 1 applicator rectally 2 (two) times daily as needed for hemorrhoids or itching. 10 g 3  . levothyroxine (SYNTHROID, LEVOTHROID) 112 MCG tablet Take 1 tablet (112 mcg total) by mouth daily. 90 tablet 3  . methocarbamol (ROBAXIN) 500 MG tablet Take 1 tablet (500 mg total) by mouth 2 (two) times daily. 20 tablet 0  . morphine (MS CONTIN) 15 MG 12 hr tablet Take 1 tablet (15 mg total) by mouth every 12 (twelve) hours. 60 tablet 0  . oxyCODONE (OXY IR/ROXICODONE) 5 MG immediate release tablet Take 1 tablet (5 mg total) by mouth every 6 (six) hours as needed for severe pain. 100 tablet 0  . potassium chloride (KLOR-CON 10) 10 MEQ tablet Take 1 tablet (10 mEq total) by mouth daily. (Patient taking differently: Take 10 mEq by mouth daily as needed (tales with lasix as needed). ) 30 tablet 3  . pravastatin (PRAVACHOL) 20 MG tablet TAKE ONE TABLET BY MOUTH DAILY FOR CHOLESTEROL 30 tablet 3   No current facility-administered medications on file prior to visit.        Objective:   Physical Exam Blood pressure (!) 142/100, pulse 72, temperature 98.4 F (36.9 C), height 5' 5.5" (1.664 m), weight 184 lb (83.5 kg). Alert and oriented. Skin warm and dry. Oral mucosa is moist.   . Sclera anicteric, conjunctivae is pink. Thyroid not enlarged. No cervical lymphadenopathy. Lungs clear. Heart regular rate and rhythm.  Abdomen is soft. Bowel sounds are positive. No hepatomegaly. No abdominal masses felt. No tenderness.  No edema to lower extremities.   Rectal exam was very painful to patient.       Assessment & Plan:  Rectal pain ? Nerve pain. I discussed  with Dr. Laural Golden. She will f/u with [facility] or at [facility].

## 2016-03-01 ENCOUNTER — Other Ambulatory Visit: Payer: Self-pay | Admitting: Family Medicine

## 2016-03-01 NOTE — Telephone Encounter (Signed)
Last OV 01-31-16 Medication currently not meds list  Ok to refill?

## 2016-03-04 NOTE — Telephone Encounter (Signed)
Okay 

## 2016-03-13 ENCOUNTER — Ambulatory Visit (INDEPENDENT_AMBULATORY_CARE_PROVIDER_SITE_OTHER): Payer: Medicare Other | Admitting: Family Medicine

## 2016-03-13 ENCOUNTER — Encounter: Payer: Self-pay | Admitting: Family Medicine

## 2016-03-13 VITALS — BP 126/76 | HR 80 | Temp 98.2°F | Resp 16 | Wt 189.0 lb

## 2016-03-13 DIAGNOSIS — R8290 Unspecified abnormal findings in urine: Secondary | ICD-10-CM | POA: Diagnosis not present

## 2016-03-13 DIAGNOSIS — G8929 Other chronic pain: Secondary | ICD-10-CM

## 2016-03-13 DIAGNOSIS — A09 Infectious gastroenteritis and colitis, unspecified: Secondary | ICD-10-CM | POA: Diagnosis not present

## 2016-03-13 DIAGNOSIS — K6289 Other specified diseases of anus and rectum: Secondary | ICD-10-CM

## 2016-03-13 LAB — URINALYSIS, ROUTINE W REFLEX MICROSCOPIC
Bilirubin Urine: NEGATIVE
GLUCOSE, UA: NEGATIVE
Ketones, ur: NEGATIVE
NITRITE: NEGATIVE
Specific Gravity, Urine: 1.015 (ref 1.001–1.035)
pH: 6 (ref 5.0–8.0)

## 2016-03-13 LAB — URINALYSIS, MICROSCOPIC ONLY
Casts: NONE SEEN [LPF]
Crystals: NONE SEEN [HPF]
YEAST: NONE SEEN [HPF]

## 2016-03-13 MED ORDER — CIPROFLOXACIN HCL 500 MG PO TABS: 500.0000 mg | ORAL_TABLET | Freq: Two times a day (BID) | ORAL | 0 refills | Status: DC

## 2016-03-13 MED ORDER — HYDROCORTISONE 2.5 % RE CREA: 1.0000 "application " | TOPICAL_CREAM | Freq: Two times a day (BID) | RECTAL | 3 refills | Status: DC

## 2016-03-13 NOTE — Progress Notes (Signed)
   Subjective:    Patient ID: [name], female    DOB: 27-Jan-1951, 65 y.o.   MRN: [id number]  Patient presents for Hemorrhoids and Urinary Tract Infection (Strong odor to urine)  Pt here with odor to urine, chronic rectal pain. She would like to return for another opioon for her rectal pain. She has chronic rectal thickening noted on CT scan, she has had anoscopy, colonoscopy as well.Treated for rectal ulcer as well.  Multiple suppositories, lidocaine, bowel regimen, pain meds used but has chronic pain. This time flares started after diarrhea while she was on cruise ship. She had diarrhea for 4 days straight resolved when she returned home Sunday, denies fever N/V. Minimal abdominal pain now.  Reviewed last note- at [facility] seen by colorectal surgeon, they did work up nothing found for rectal pain Referred back to local GI- Dr. Laural Golden ? Inflammatory Bowel Disease- this was not found  She is upset she has chronic rectal pain and nothing found    Review Of Systems:  GEN- denies fatigue, fever, weight loss,weakness, recent illness HEENT- denies eye drainage, change in vision, nasal discharge, CVS- denies chest pain, palpitations RESP- denies SOB, cough, wheeze ABD- denies N/V, +change in stools,+ abd pain GU- denies dysuria, hematuria, dribbling, incontinence MSK- denies joint pain, muscle aches, injury Neuro- denies headache, dizziness, syncope, seizure activity       Objective:    BP 126/76   Pulse 80   Temp 98.2 F (36.8 C) (Oral)   Resp 16   Wt 189 lb (85.7 kg)   BMI 30.97 kg/m  GEN- NAD, alert and oriented x3 HEENT- PERRL, EOMI, non injected sclera, pink conjunctiva, MMM, oropharynx clear CVS- RRR, no murmur RESP-CTAB ABD-NABS,soft,NT,ND, urostomy bag in place- urine clear  EXT- No edema Pulses- Radial  2+        Assessment & Plan:      Problem List Items Addressed This Visit    Chronic rectal pain    I dont know what else to offer patient. She asked  about pain management which is ideal but she has Oxycodone, Dilaudid none of these seem to help Lidocaine mixes have not helped  She wants another option at [facility] for them to review her records Will set up   I personally feels her diet habits worsen her bowels causing more rectal pain. This weekend had some type of travelors diarrhea while on cruise ship. Urine sent but she is colonized Given Cipro to cover the traverlors diarrhea contact and urine      Relevant Orders   Ambulatory referral to Gastroenterology    Other Visit Diagnoses    Bad odor of urine    -  Primary   Relevant Orders   Urinalysis, Routine w reflex microscopic (not at [facility]) (Completed)   Urine culture   Diarrhea, travelers'          Note: This dictation was prepared with Dragon dictation along with smaller phrase technology. Any transcriptional errors that result from this process are unintentional.

## 2016-03-13 NOTE — Patient Instructions (Addendum)
Referral back to [facility]  Use the cream  F/U as previous

## 2016-03-14 ENCOUNTER — Telehealth: Payer: Self-pay | Admitting: Family Medicine

## 2016-03-14 LAB — URINE CULTURE

## 2016-03-14 NOTE — Telephone Encounter (Signed)
Call placed to pharmacy.   Patient insurance will not cover proctofoam, but will cover hydrocortisone.   VO given to pharmacy to switch with same directions.   Call placed to patient and patient made aware.

## 2016-03-14 NOTE — Telephone Encounter (Signed)
[facility] CALLING REGARDING THIS PATIENT, AND CLARIFICATION ON A CREAM THAT WAS PRESCRIBED FOR HER PLEASE CALL HER AT [phone]

## 2016-03-14 NOTE — Assessment & Plan Note (Signed)
I dont know what else to offer patient. She asked about pain management which is ideal but she has Oxycodone, Dilaudid none of these seem to help Lidocaine mixes have not helped  She wants another option at [facility] for them to review her records Will set up   I personally feels her diet habits worsen her bowels causing more rectal pain. This weekend had some type of travelors diarrhea while on cruise ship. Urine sent but she is colonized Given Cipro to cover the traverlors diarrhea contact and urine

## 2016-03-26 ENCOUNTER — Other Ambulatory Visit: Payer: Self-pay | Admitting: Family Medicine

## 2016-03-26 NOTE — Telephone Encounter (Signed)
Received call from patient.  Requested refill on MS Contin and Oxycodone.   Ok to refill??  Last office visit 03/13/2016.  Last refill on both 02/21/2016.

## 2016-03-26 NOTE — Telephone Encounter (Signed)
okay

## 2016-03-27 MED ORDER — OXYCODONE HCL 5 MG PO TABS: 5.0000 mg | ORAL_TABLET | Freq: Four times a day (QID) | ORAL | 0 refills | Status: DC | PRN

## 2016-03-27 MED ORDER — MORPHINE SULFATE ER 15 MG PO TBCR: 15.0000 mg | EXTENDED_RELEASE_TABLET | Freq: Two times a day (BID) | ORAL | 0 refills | Status: DC

## 2016-03-27 NOTE — Telephone Encounter (Signed)
Prescription printed and patient made aware to come to office to pick up after 2pm on 03/27/2016.

## 2016-04-12 DIAGNOSIS — Z936 Other artificial openings of urinary tract status: Secondary | ICD-10-CM | POA: Diagnosis not present

## 2016-04-12 DIAGNOSIS — C679 Malignant neoplasm of bladder, unspecified: Secondary | ICD-10-CM | POA: Diagnosis not present

## 2016-05-02 ENCOUNTER — Telehealth: Payer: Self-pay | Admitting: *Deleted

## 2016-05-02 NOTE — Telephone Encounter (Signed)
Received call from patient.   Requested refill on MS Contin and Oxycodone.   Ok to refill??  Last office visit 03/13/2016.  Last refill 03/27/2016 on both.

## 2016-05-03 MED ORDER — MORPHINE SULFATE ER 15 MG PO TBCR: 15.0000 mg | EXTENDED_RELEASE_TABLET | Freq: Two times a day (BID) | ORAL | 0 refills | Status: DC

## 2016-05-03 MED ORDER — OXYCODONE HCL 5 MG PO TABS: 5.0000 mg | ORAL_TABLET | Freq: Four times a day (QID) | ORAL | 0 refills | Status: DC | PRN

## 2016-05-03 NOTE — Telephone Encounter (Signed)
Okay to refill? 

## 2016-05-03 NOTE — Telephone Encounter (Signed)
Prescription printed and patient made aware to come to office to pick up on 05/03/2016 after 4pm.

## 2016-05-09 ENCOUNTER — Other Ambulatory Visit: Payer: Self-pay | Admitting: Family Medicine

## 2016-05-11 DIAGNOSIS — Z79899 Other long term (current) drug therapy: Secondary | ICD-10-CM | POA: Diagnosis not present

## 2016-05-11 DIAGNOSIS — Z8551 Personal history of malignant neoplasm of bladder: Secondary | ICD-10-CM | POA: Diagnosis not present

## 2016-05-11 DIAGNOSIS — E7800 Pure hypercholesterolemia, unspecified: Secondary | ICD-10-CM | POA: Diagnosis not present

## 2016-05-11 DIAGNOSIS — S300XXA Contusion of lower back and pelvis, initial encounter: Secondary | ICD-10-CM | POA: Diagnosis not present

## 2016-05-11 DIAGNOSIS — W07XXXA Fall from chair, initial encounter: Secondary | ICD-10-CM | POA: Diagnosis not present

## 2016-05-11 DIAGNOSIS — M533 Sacrococcygeal disorders, not elsewhere classified: Secondary | ICD-10-CM | POA: Diagnosis not present

## 2016-05-22 DIAGNOSIS — C678 Malignant neoplasm of overlapping sites of bladder: Secondary | ICD-10-CM | POA: Diagnosis not present

## 2016-05-22 DIAGNOSIS — C679 Malignant neoplasm of bladder, unspecified: Secondary | ICD-10-CM | POA: Diagnosis not present

## 2016-05-27 ENCOUNTER — Encounter: Payer: Self-pay | Admitting: Family Medicine

## 2016-05-27 ENCOUNTER — Ambulatory Visit (INDEPENDENT_AMBULATORY_CARE_PROVIDER_SITE_OTHER): Payer: Medicare Other | Admitting: Family Medicine

## 2016-05-27 VITALS — BP 126/78 | HR 84 | Temp 98.1°F | Resp 16 | Ht 65.5 in | Wt 190.0 lb

## 2016-05-27 DIAGNOSIS — R609 Edema, unspecified: Secondary | ICD-10-CM | POA: Diagnosis not present

## 2016-05-27 DIAGNOSIS — M7021 Olecranon bursitis, right elbow: Secondary | ICD-10-CM | POA: Diagnosis not present

## 2016-05-27 DIAGNOSIS — S300XXD Contusion of lower back and pelvis, subsequent encounter: Secondary | ICD-10-CM

## 2016-05-27 MED ORDER — METHYLPREDNISOLONE 4 MG PO TBPK: ORAL_TABLET | ORAL | 0 refills | Status: DC

## 2016-05-27 NOTE — Progress Notes (Signed)
   Subjective:    Patient ID: [name], female    DOB: Mar 01, 1951, 65 y.o.   MRN: [id number]  Patient presents for S/P Fall- 11/10 (fell off chair while standing it in- landed on knees and behind- voices c/o pain in R arm and buttocks)  Patient here to follow-up fall. 2 weeks ago she was standing in a chair fell off landing on her knees and fell to her buttocks. Since then she's had pain in her right arm and in her buttocks. She is on chronic pain medication oxycodone 5 mg for breakthrough pain and MS Contin 15mg  every 12 hours Seen at [facility] after fall, No fracture in hip/spine only hematoma, arm did not start hurting until 1 week after fall. She has however been crocheting a lot  Legs swollen for past week, took water pill 2 times, has not needed regulary    Review Of Systems:  GEN- denies fatigue, fever, weight loss,weakness, recent illness HEENT- denies eye drainage, change in vision, nasal discharge, CVS- denies chest pain, palpitations RESP- denies SOB, cough, wheeze ABD- denies N/V, change in stools, abd pain GU- denies dysuria, hematuria, dribbling, incontinence MSK- +joint pain, muscle aches, injury Neuro- denies headache, dizziness, syncope, seizure activity       Objective:    BP 126/78 (BP Location: Left Arm, Patient Position: Sitting, Cuff Size: Normal)   Pulse 84   Temp 98.1 F (36.7 C) (Oral)   Resp 16   Ht 5' 5.5" (1.664 m)   Wt 190 lb (86.2 kg)   SpO2 98%   BMI 31.14 kg/m  GEN- NAD, alert and oriented x3 CVS- RRR, no murmur RESP-CTAB MSK- Left buttucks- large hematoma, TTP with superficial bruising noted, no fluctuance  Right arm- mild swelling lateral epicondyle and olecranon, no erythema, FROM elbow, pain with flexion, no pain with resistance to supination, wrist FROM, shoulder FROM, no bruising noted  EXT- pedal edema Pulses- Radia  2+        Assessment & Plan:      Problem List Items Addressed This Visit    Peripheral edema   Restart diuretic take BID for next 3 days, then daily as needed Discussed low salt diet, elevating feet       Other Visit Diagnoses    Traumatic hematoma of buttock, subsequent encounter    -  Primary   Heat to buttocks, obtain records from ER, has pain meds   Olecranon bursitis of right elbow       mild bursitis, fall and overuse, doubt fracture with ROM today, no NSAIDS with her eloquis, given steroids short term      Note: This dictation was prepared with Dragon dictation along with smaller phrase technology. Any transcriptional errors that result from this process are unintentional.

## 2016-05-27 NOTE — Assessment & Plan Note (Signed)
Restart diuretic take BID for next 3 days, then daily as needed Discussed low salt diet, elevating feet

## 2016-05-27 NOTE — Patient Instructions (Addendum)
Release of records- [place]  Heat pack to hematoma on buttocks Ice to elbow Steroid for inflammation Lasix take twice a day for 3 days for fluid  F/U 3 weeks   Hematoma A hematoma is a collection of blood. The collection of blood can turn into a hard, painful lump under the skin. Your skin may turn blue or yellow if the hematoma is close to the surface of the skin. Most hematomas get better in a few days to weeks. Some hematomas are serious and need medical care. Hematomas can be very small or very big. Follow these instructions at home:  Apply ice to the injured area:  Put ice in a plastic bag.  Place a towel between your skin and the bag.  Leave the ice on for 20 minutes, 2-3 times a day for the first 1 to 2 days.  After the first 2 days, switch to using warm packs on the injured area.  Raise (elevate) the injured area to lessen pain and puffiness (swelling). You may also wrap the area with an elastic bandage. Make sure the bandage is not wrapped too tight.  If you have a painful hematoma on your leg or foot, you may use crutches for a couple days.  Only take medicines as told by your doctor. Get help right away if:  Your pain gets worse.  Your pain is not controlled with medicine.  You have a fever.  Your puffiness gets worse.  Your skin turns more blue or yellow.  Your skin over the hematoma breaks or starts bleeding.  Your hematoma is in your chest or belly (abdomen) and you are short of breath, feel weak, or have a change in consciousness.  Your hematoma is on your scalp and you have a headache that gets worse or a change in alertness or consciousness. This information is not intended to replace advice given to you by your health care provider. Make sure you discuss any questions you have with your health care provider. Document Released: 07/25/2004 Document Revised: 11/23/2015 Document Reviewed: 11/25/2012 Elsevier Interactive Patient Education  2017 [employer].

## 2016-05-29 NOTE — Telephone Encounter (Signed)
Rx filled

## 2016-05-30 ENCOUNTER — Other Ambulatory Visit: Payer: Self-pay | Admitting: Family Medicine

## 2016-06-04 ENCOUNTER — Telehealth: Payer: Self-pay | Admitting: *Deleted

## 2016-06-04 MED ORDER — MORPHINE SULFATE ER 15 MG PO TBCR: 15.0000 mg | EXTENDED_RELEASE_TABLET | Freq: Two times a day (BID) | ORAL | 0 refills | Status: DC

## 2016-06-04 MED ORDER — OXYCODONE HCL 5 MG PO TABS: 5.0000 mg | ORAL_TABLET | Freq: Four times a day (QID) | ORAL | 0 refills | Status: DC | PRN

## 2016-06-04 NOTE — Telephone Encounter (Signed)
okay

## 2016-06-04 NOTE — Telephone Encounter (Signed)
Prescription printed and patient made aware to come to office to pick up on 06/05/2016.

## 2016-06-04 NOTE — Telephone Encounter (Signed)
Received call from patient.   Requested refill on MS Contin and Oxy IR.   Ok to refill??  Last office visit 05/27/2016.  Last refill on both 05/03/2016.

## 2016-06-06 DIAGNOSIS — C679 Malignant neoplasm of bladder, unspecified: Secondary | ICD-10-CM | POA: Diagnosis not present

## 2016-06-06 DIAGNOSIS — Z936 Other artificial openings of urinary tract status: Secondary | ICD-10-CM | POA: Diagnosis not present

## 2016-06-10 ENCOUNTER — Other Ambulatory Visit: Payer: Self-pay | Admitting: Family Medicine

## 2016-06-17 ENCOUNTER — Encounter: Payer: Self-pay | Admitting: Family Medicine

## 2016-06-17 ENCOUNTER — Ambulatory Visit (INDEPENDENT_AMBULATORY_CARE_PROVIDER_SITE_OTHER): Payer: Medicare Other | Admitting: Family Medicine

## 2016-06-17 VITALS — BP 128/72 | HR 90 | Temp 98.5°F | Resp 14 | Ht 65.5 in | Wt 183.0 lb

## 2016-06-17 DIAGNOSIS — R109 Unspecified abdominal pain: Secondary | ICD-10-CM

## 2016-06-17 DIAGNOSIS — S300XXD Contusion of lower back and pelvis, subsequent encounter: Secondary | ICD-10-CM | POA: Diagnosis not present

## 2016-06-17 DIAGNOSIS — K6289 Other specified diseases of anus and rectum: Secondary | ICD-10-CM | POA: Diagnosis not present

## 2016-06-17 DIAGNOSIS — T300 Burn of unspecified body region, unspecified degree: Secondary | ICD-10-CM

## 2016-06-17 DIAGNOSIS — K626 Ulcer of anus and rectum: Secondary | ICD-10-CM | POA: Diagnosis not present

## 2016-06-17 DIAGNOSIS — G8929 Other chronic pain: Secondary | ICD-10-CM

## 2016-06-17 MED ORDER — LEVOTHYROXINE SODIUM 112 MCG PO TABS: 112.0000 ug | ORAL_TABLET | Freq: Every day | ORAL | 3 refills | Status: DC

## 2016-06-17 MED ORDER — APIXABAN 5 MG PO TABS: 5.0000 mg | ORAL_TABLET | Freq: Two times a day (BID) | ORAL | 3 refills | Status: DC

## 2016-06-17 MED ORDER — PRAVASTATIN SODIUM 20 MG PO TABS: ORAL_TABLET | ORAL | 2 refills | Status: DC

## 2016-06-17 MED ORDER — POTASSIUM CHLORIDE CRYS ER 10 MEQ PO TBCR: 10.0000 meq | EXTENDED_RELEASE_TABLET | Freq: Every day | ORAL | 3 refills | Status: DC

## 2016-06-17 MED ORDER — DEXLANSOPRAZOLE 60 MG PO CPDR: 1.0000 | DELAYED_RELEASE_CAPSULE | Freq: Every day | ORAL | 1 refills | Status: DC

## 2016-06-17 MED ORDER — MORPHINE SULFATE ER 15 MG PO TBCR: 15.0000 mg | EXTENDED_RELEASE_TABLET | Freq: Two times a day (BID) | ORAL | 0 refills | Status: DC

## 2016-06-17 MED ORDER — FUROSEMIDE 40 MG PO TABS: 40.0000 mg | ORAL_TABLET | Freq: Two times a day (BID) | ORAL | 1 refills | Status: DC | PRN

## 2016-06-17 NOTE — Patient Instructions (Addendum)
Referral to GI at [facility] current medications  Change medications to 90 day supply  F/U 4 months

## 2016-06-17 NOTE — Assessment & Plan Note (Addendum)
Pain medication refilled, history of multiple abd surgeries  Has a urostomy. She is colonized with regards to her urine. Recommended she decrease her soda intake and increase her water intake she is not having any systemic symptoms to suggest she is having an actual infection

## 2016-06-17 NOTE — Assessment & Plan Note (Signed)
Chronic rectal pain, intermittent bleeding, has had multiple scopes , given lidocaine creams, suppositories, stool softeners, oral pain meds, no improvement.  Request another referral, will send to [facility]

## 2016-06-17 NOTE — Progress Notes (Signed)
Subjective:    Patient ID: [name], female    DOB: Nov 13, 1950, 65 y.o.   MRN: [id number]  Patient presents for 3 week F/U and Malodorous Urine (strong odor to urine)  Patient year for follow-up since her fall 3 weeks ago. She was seen for traumatic hematoma of her buttocks the swelling has gone down significantly she still has an area of induration but is no longer tender. She still been using the compresses which is helped.  With regard to her olecranon she completed the scars that she was making and now her pain is much improved. She was also given steroid Dosepak for the inflammation.  She has noticed a strong odor to urine but no fever, no pain, no discharge, no visible blood in her urostomy bag  She would like a referral to another gastroenterologist. She continues to have severe rectal pain she has seen multiple providers for this. She's been diagnosed with rectal ulcer, she's also had a restriction during her terminal ileum which dilation was performed back in December 2015. She's had rectal pain typically with her soft bowel movements for the past 3 years on and off.  She also has a burn on her right forearm that occurred when she touched a pot last Wednesday she did not have any blistering no drainage it is nontender   Review Of Systems:  GEN- denies fatigue, fever, weight loss,weakness, recent illness HEENT- denies eye drainage, change in vision, nasal discharge, CVS- denies chest pain, palpitations RESP- denies SOB, cough, wheeze ABD- denies N/V, change in stools, abd pain GU- denies dysuria, hematuria, dribbling, incontinence MSK- + joint pain, muscle aches, injury Neuro- denies headache, dizziness, syncope, seizure activity       Objective:    BP 128/72 (BP Location: Left Arm, Patient Position: Sitting, Cuff Size: Large)   Pulse 90   Temp 98.5 F (36.9 C) (Oral)   Resp 14   Ht 5' 5.5" (1.664 m)   Wt 183 lb (83 kg)   SpO2 98%   BMI 29.99 kg/m  GEN-  NAD, alert and oriented x3 HEENT- PERRL, EOMI, non injected sclera, pink conjunctiva, MMM, oropharynx clear CVS- RRR, no murmur RESP-CTAB ABD-NABS,soft,NT,ND MSK- Left buttucks-small hematoma, NT, no fluctuance  Right arm-FROM elbow, NT to palpation , no pain with resistance to supination  Skin- right forearm- hyperpigmented superficial burn  NT  EXT- No edema Pulses- Radial  2+        Assessment & Plan:      Problem List Items Addressed This Visit    Rectal ulcer    Chronic rectal pain, intermittent bleeding, has had multiple scopes , given lidocaine creams, suppositories, stool softeners, oral pain meds, no improvement.  Request another referral, will send to [place]      Relevant Orders   Ambulatory referral to Colorectal Surgery   Chronic rectal pain   Relevant Orders   Ambulatory referral to Colorectal Surgery   Chronic abdominal pain    Pain medication refilled, history of multiple abd surgeries  Has a urostomy. She is colonized with regards to her urine. Recommended she decrease her soda intake and increase her water intake she is not having any systemic symptoms to suggest she is having an actual infection      Relevant Medications   morphine (MS CONTIN) 15 MG 12 hr tablet    Other Visit Diagnoses    Traumatic hematoma of buttock, subsequent encounter    -  Primary   much improved,  wll continue to resolve with time   Superficial burn       now more of hyperpigmented scar, can use cocoa butter to help lighten      Note: This dictation was prepared with Dragon dictation along with smaller phrase technology. Any transcriptional errors that result from this process are unintentional.

## 2016-07-16 DIAGNOSIS — K6289 Other specified diseases of anus and rectum: Secondary | ICD-10-CM | POA: Diagnosis not present

## 2016-07-26 ENCOUNTER — Telehealth: Payer: Self-pay | Admitting: Family Medicine

## 2016-07-26 MED ORDER — MORPHINE SULFATE ER 15 MG PO TBCR: 15.0000 mg | EXTENDED_RELEASE_TABLET | Freq: Two times a day (BID) | ORAL | 0 refills | Status: DC

## 2016-07-26 MED ORDER — OXYCODONE HCL 5 MG PO TABS: 5.0000 mg | ORAL_TABLET | Freq: Four times a day (QID) | ORAL | 0 refills | Status: DC | PRN

## 2016-07-26 NOTE — Telephone Encounter (Signed)
Okay to refill? 

## 2016-07-26 NOTE — Telephone Encounter (Signed)
CB#[id number]  Patient requesting refill on oxycodone 5 mg and morphine 15 mg

## 2016-07-26 NOTE — Telephone Encounter (Signed)
Ok to refill??  Last office visit 06/17/2016.  Last refill on Oxycodone 06/04/2016.  Last refill on Morphine 06/17/2016.

## 2016-07-26 NOTE — Telephone Encounter (Signed)
Prescription printed and patient made aware to come to office to pick up after 4pm on 07/26/2016.

## 2016-07-29 DIAGNOSIS — H524 Presbyopia: Secondary | ICD-10-CM | POA: Diagnosis not present

## 2016-07-29 DIAGNOSIS — H52221 Regular astigmatism, right eye: Secondary | ICD-10-CM | POA: Diagnosis not present

## 2016-07-29 DIAGNOSIS — H2513 Age-related nuclear cataract, bilateral: Secondary | ICD-10-CM | POA: Diagnosis not present

## 2016-07-29 DIAGNOSIS — H5203 Hypermetropia, bilateral: Secondary | ICD-10-CM | POA: Diagnosis not present

## 2016-07-31 DIAGNOSIS — C679 Malignant neoplasm of bladder, unspecified: Secondary | ICD-10-CM | POA: Diagnosis not present

## 2016-07-31 DIAGNOSIS — Z936 Other artificial openings of urinary tract status: Secondary | ICD-10-CM | POA: Diagnosis not present

## 2016-08-09 ENCOUNTER — Encounter: Payer: Self-pay | Admitting: Family Medicine

## 2016-08-09 ENCOUNTER — Ambulatory Visit (INDEPENDENT_AMBULATORY_CARE_PROVIDER_SITE_OTHER): Payer: Medicare Other | Admitting: Family Medicine

## 2016-08-09 VITALS — BP 128/82 | HR 66 | Temp 98.3°F | Resp 12 | Ht 65.5 in | Wt 190.0 lb

## 2016-08-09 DIAGNOSIS — N390 Urinary tract infection, site not specified: Secondary | ICD-10-CM

## 2016-08-09 LAB — URINALYSIS, ROUTINE W REFLEX MICROSCOPIC
Bilirubin Urine: NEGATIVE
GLUCOSE, UA: NEGATIVE
Ketones, ur: NEGATIVE
Nitrite: NEGATIVE
Specific Gravity, Urine: 1.02 (ref 1.001–1.035)
pH: 6 (ref 5.0–8.0)

## 2016-08-09 LAB — URINALYSIS, MICROSCOPIC ONLY
Casts: NONE SEEN [LPF]
Crystals: NONE SEEN [HPF]
YEAST: NONE SEEN [HPF]

## 2016-08-09 MED ORDER — CIPROFLOXACIN HCL 500 MG PO TABS: 500.0000 mg | ORAL_TABLET | Freq: Two times a day (BID) | ORAL | 0 refills | Status: DC

## 2016-08-09 MED ORDER — HYDROCORTISONE 2.5 % EX CREA: TOPICAL_CREAM | CUTANEOUS | 1 refills | Status: DC

## 2016-08-09 NOTE — Progress Notes (Signed)
   Subjective:    Patient ID: [name], female    DOB: July 23, 1950, 66 y.o.   MRN: [id number]  Patient presents for [facility] Urine (has ostomy bag- dark colored urine with strong foul odor)  Pt here with Complaint of UTI. She has a urostomy bag she is colonized with bacteria. She often comes in complaining of foul-smelling urine. Her last culture from September was 2 organisms Note she was seen by Dr. Morton Stall colorectal surgeon at [facility] there was nothing further that they could do for her chronic rectal pain. She often has Klebsiella in most of her cultures.  For the past few days she has noted an increase in her urine smell she's also had some mild cramping. No fever no nausea vomiting. She did take a sample from a clean urostomy bag today.  Review Of Systems:  GEN- denies fatigue, fever, weight loss,weakness, recent illness HEENT- denies eye drainage, change in vision, nasal discharge, CVS- denies chest pain, palpitations RESP- denies SOB, cough, wheeze ABD- denies N/V, change in stools, abd pain GU- denies dysuria, hematuria, dribbling, incontinence MSK- denies joint pain, muscle aches, injury Neuro- denies headache, dizziness, syncope, seizure activity       Objective:    BP 128/82   Pulse 66   Temp 98.3 F (36.8 C) (Oral)   Resp 12   Ht 5' 5.5" (1.664 m)   Wt 190 lb (86.2 kg)   SpO2 99%   BMI 31.14 kg/m  GEN- NAD, alert and oriented x3 HEENT- PERRL, EOMI, non injected sclera, pink conjunctiva, MMM, oropharynx clear CVS- RRR, no murmur RESP-CTAB ABD-NABS,soft,NT,ND ustomy with clear urine, stump clear, no CVA tenderness EXT- No edema Pulses- Radial  2+       Assessment & Plan:      Problem List Items Addressed This Visit    Recurrent UTI - Primary    Send culture, pt set to leave out of the state in a few days, will start Cipro, discussed red flags.  Since she has cramping with it will treat.      Relevant Orders   Urinalysis, Routine w reflex microscopic (Completed)   Urine culture   Urine culture      Note: This dictation was prepared with Dragon dictation along with smaller phrase technology. Any transcriptional errors that result from this process are unintentional.

## 2016-08-09 NOTE — Patient Instructions (Signed)
Take antibiotics Take a probiotic  F/U as needed

## 2016-08-09 NOTE — Assessment & Plan Note (Signed)
Send culture, pt set to leave out of the state in a few days, will start Cipro, discussed red flags.  Since she has cramping with it will treat.

## 2016-08-11 LAB — URINE CULTURE

## 2016-09-02 ENCOUNTER — Other Ambulatory Visit: Payer: Self-pay | Admitting: Family Medicine

## 2016-09-04 ENCOUNTER — Telehealth: Payer: Self-pay | Admitting: *Deleted

## 2016-09-04 MED ORDER — MORPHINE SULFATE ER 15 MG PO TBCR: 15.0000 mg | EXTENDED_RELEASE_TABLET | Freq: Two times a day (BID) | ORAL | 0 refills | Status: DC

## 2016-09-04 MED ORDER — OXYCODONE HCL 5 MG PO TABS: 5.0000 mg | ORAL_TABLET | Freq: Four times a day (QID) | ORAL | 0 refills | Status: DC | PRN

## 2016-09-04 NOTE — Telephone Encounter (Signed)
Prescription printed and patient made aware to come to office to pick up after 2pm on 09/04/2016.

## 2016-09-04 NOTE — Telephone Encounter (Signed)
Received call from patient.   Requested refill on Oxycodone and MS Contin.   Ok to refill??  Last office visit 08/09/2016.  Last refill 07/26/2016 on both.

## 2016-09-04 NOTE — Telephone Encounter (Signed)
Okay to refill? 

## 2016-09-25 DIAGNOSIS — C679 Malignant neoplasm of bladder, unspecified: Secondary | ICD-10-CM | POA: Diagnosis not present

## 2016-09-25 DIAGNOSIS — Z936 Other artificial openings of urinary tract status: Secondary | ICD-10-CM | POA: Diagnosis not present

## 2016-09-30 ENCOUNTER — Encounter (HOSPITAL_COMMUNITY): Payer: Self-pay | Admitting: Emergency Medicine

## 2016-09-30 ENCOUNTER — Emergency Department (HOSPITAL_COMMUNITY): Payer: Medicare Other

## 2016-09-30 ENCOUNTER — Telehealth: Payer: Self-pay | Admitting: *Deleted

## 2016-09-30 ENCOUNTER — Emergency Department (HOSPITAL_COMMUNITY)
Admission: EM | Admit: 2016-09-30 | Discharge: 2016-09-30 | Disposition: A | Discharge status: Home / Self Care | Payer: Medicare Other | Attending: Emergency Medicine | Admitting: Emergency Medicine

## 2016-09-30 DIAGNOSIS — Z79899 Other long term (current) drug therapy: Secondary | ICD-10-CM | POA: Insufficient documentation

## 2016-09-30 DIAGNOSIS — R202 Paresthesia of skin: Secondary | ICD-10-CM | POA: Diagnosis not present

## 2016-09-30 DIAGNOSIS — I10 Essential (primary) hypertension: Secondary | ICD-10-CM | POA: Insufficient documentation

## 2016-09-30 DIAGNOSIS — E039 Hypothyroidism, unspecified: Secondary | ICD-10-CM | POA: Diagnosis not present

## 2016-09-30 DIAGNOSIS — R200 Anesthesia of skin: Secondary | ICD-10-CM | POA: Diagnosis not present

## 2016-09-30 DIAGNOSIS — Z87891 Personal history of nicotine dependence: Secondary | ICD-10-CM | POA: Insufficient documentation

## 2016-09-30 DIAGNOSIS — E876 Hypokalemia: Secondary | ICD-10-CM | POA: Diagnosis not present

## 2016-09-30 LAB — COMPREHENSIVE METABOLIC PANEL
ALBUMIN: 3.8 g/dL (ref 3.5–5.0)
ALT: 13 U/L — ABNORMAL LOW (ref 14–54)
ANION GAP: 11 (ref 5–15)
AST: 16 U/L (ref 15–41)
Alkaline Phosphatase: 78 U/L (ref 38–126)
BUN: 14 mg/dL (ref 6–20)
CALCIUM: 8.1 mg/dL — AB (ref 8.9–10.3)
CHLORIDE: 100 mmol/L — AB (ref 101–111)
CO2: 23 mmol/L (ref 22–32)
Creatinine, Ser: 1.05 mg/dL — ABNORMAL HIGH (ref 0.44–1.00)
GFR calc Af Amer: >60 mL/min (ref >60)
GFR calc non Af Amer: 55 mL/min — ABNORMAL LOW (ref >60)
GLUCOSE: 90 mg/dL (ref 65–99)
POTASSIUM: 2.9 mmol/L — AB (ref 3.5–5.1)
SODIUM: 134 mmol/L — AB (ref 135–145)
Total Bilirubin: 0.5 mg/dL (ref 0.3–1.2)
Total Protein: 7.5 g/dL (ref 6.5–8.1)

## 2016-09-30 LAB — CBC
HCT: 36.7 % (ref 36.0–46.0)
HEMOGLOBIN: 12.8 g/dL (ref 12.0–15.0)
MCH: 25.7 pg — AB (ref 26.0–34.0)
MCHC: 34.9 g/dL (ref 30.0–36.0)
MCV: 73.5 fL — ABNORMAL LOW (ref 78.0–100.0)
PLATELETS: 288 10*3/uL (ref 150–400)
RBC: 4.99 MIL/uL (ref 3.87–5.11)
RDW: 16.9 % — ABNORMAL HIGH (ref 11.5–15.5)
WBC: 8 10*3/uL (ref 4.0–10.5)

## 2016-09-30 MED ORDER — POTASSIUM CHLORIDE CRYS ER 10 MEQ PO TBCR: 10.0000 meq | EXTENDED_RELEASE_TABLET | Freq: Three times a day (TID) | ORAL | 3 refills | Status: DC

## 2016-09-30 MED ORDER — POTASSIUM CHLORIDE 20 MEQ/15ML (10%) PO SOLN
40.0000 meq | Freq: Once | ORAL | Status: AC
  Administered 2016-09-30: 40 meq via ORAL
  Filled 2016-09-30: qty 30

## 2016-09-30 NOTE — ED Triage Notes (Signed)
Pt c/o facial numbness started 25 mintues ago. States will get worse then better. Smile symmetrical. Steady gait. nad. Can feel touch to both sides of face and equally. A/o

## 2016-09-30 NOTE — Discharge Instructions (Signed)
Please increase your potassium to 3 times daily and have recheck a potassium level this week with your doctor.

## 2016-09-30 NOTE — Telephone Encounter (Signed)
Received call from patient.   Requested refill on MS Contin and Oxycodone.   Ok to refill??  Last office visit 08/09/2016.  Last refill 09/04/2016 on both.

## 2016-09-30 NOTE — ED Provider Notes (Signed)
[facility] DEPT Provider Note   CSN: [id number] Arrival date & time: 09/30/16  1457     History   Chief Complaint Chief Complaint  Patient presents with  . Numbness    HPI KIRSTYN LEAN is a 66 y.o. female.  HPI  A 66 year old female with a history of bladder cancer who presents today complaining of perioral numbness that began this afternoon around 2:00. She denies lateralization. She states that it is improved. She states that she felt some tingling in both of her fingers at the same time. She did not notice any weakness. She drove herself to the hospital. She denies any headache, neck pain, chest pain, history of stroke or TIA.  Past Medical History:  Diagnosis Date  . Acid reflux   . Bowel obstruction several   Recurrent SBO secondary to adhesions  . Cancer [facility]) 2011 [facility])   diagnosed in 2009 per pt. Invasive High grade Urothelial carcinoma- s/p radiation and Chemo   . Chronic back pain   . DVT (deep venous thrombosis) ([place]) 2015  . Fatty liver   . Hyperlipidemia   . Hypertension    pt says taken off medication since has lost weight.  . Hypothyroidism   . Internal hemorrhoids   . Leukopenia 12/06/2011   HIV serology negative  . Malnutrition ([place])   . PE (pulmonary embolism)    [facility]  . Rectal ulcer 2016   [facility]  . UTI (lower urinary tract infection)    Recurrent    Patient Active Problem List   Diagnosis Date Noted  . Peripheral edema 05/27/2016  . Chronic rectal pain 06/02/2015  . Rectal ulcer 11/08/2014  . GERD (gastroesophageal reflux disease) 11/08/2014  . Fistula 06/27/2014  . Protein-calorie malnutrition ([place]) 06/27/2014  . Parotid mass 06/27/2014  . BV (bacterial vaginosis) 04/05/2014  . Lung nodule 04/05/2014  . Loss of weight 02/09/2014  . Grief reaction 01/28/2014  . Leg swelling 01/14/2014  . Lung infiltrate on CT 01/14/2014  . DVT (deep venous thrombosis) ([place]) 12/17/2013  . Partial small bowel  obstruction 07/21/2013  . Chronic abdominal pain 09/23/2012  . Microcytic anemia 08/26/2012  . Encounter for screening colonoscopy 05/26/2012  . Occlusion of peripherally inserted central catheter (PICC) line ([place]) 02/27/2012  . Post-menopausal bleeding 12/19/2011  . Recurrent UTI 12/05/2011  . Diarrhea 12/05/2011  . SBO (small bowel obstruction) 10/02/2011  . Fatty liver 10/02/2011  . Elevated LFTs 09/03/2011  . Hx of bladder cancer 09/03/2011  . Bowel obstruction 09/03/2011  . On total parenteral nutrition (TPN) 09/03/2011  . Hyperlipidemia 01/23/2007  . TOBACCO USE 08/22/2006  . Hypothyroidism 07/21/2006  . Essential hypertension 07/21/2006  . ARTHRITIS 07/21/2006    Past Surgical History:  Procedure Laterality Date  . ABDOMINAL SURGERY     with intestinal "puncture" x 2, exploratory surgery   . BACK SURGERY     X2  . BLADDER REMOVAL    . CHOLECYSTECTOMY    . CYSTECTOMY     breast  . HERNIA REPAIR     mesh  . ILEO CONDUIT     For bladder cancer  . PORTACATH PLACEMENT      OB History    No data available       Home Medications    Prior to Admission medications   Medication Sig Start Date End Date Taking? Authorizing Provider  apixaban (ELIQUIS) 5 MG TABS tablet Take 1 tablet (5 mg total) by mouth 2 (two) times daily. 06/17/16  Alycia Rossetti, MD  ciprofloxacin (CIPRO) 500 MG tablet Take 1 tablet (500 mg total) by mouth 2 (two) times daily. 08/09/16   Alycia Rossetti, MD  dexlansoprazole (DEXILANT) 60 MG capsule Take 1 capsule (60 mg total) by mouth daily. 06/17/16   Alycia Rossetti, MD  furosemide (LASIX) 40 MG tablet Take 1 tablet (40 mg total) by mouth 2 (two) times daily as needed for fluid. 06/17/16   Alycia Rossetti, MD  hydrocortisone 2.5 % cream APPLY RECTALLY TWICE DAILY AS NEEDED 09/02/16   Alycia Rossetti, MD  levothyroxine (SYNTHROID, LEVOTHROID) 112 MCG tablet Take 1 tablet (112 mcg total) by mouth daily. 06/17/16   Alycia Rossetti, MD    Magnesium Oxide 400 (240 Mg) MG TABS TAKE TWO (2) TABLETS BY MOUTH TWICE DAILY 06/10/16   Alycia Rossetti, MD  methocarbamol (ROBAXIN) 500 MG tablet Take 1 tablet (500 mg total) by mouth 2 (two) times daily. 12/26/15   Montine Circle, PA-C  morphine (MS CONTIN) 15 MG 12 hr tablet Take 1 tablet (15 mg total) by mouth every 12 (twelve) hours. 09/04/16   Alycia Rossetti, MD  ondansetron (ZOFRAN-ODT) 8 MG disintegrating tablet TAKE ONE TABLET BY MOUTH EVERY 8 HOURS AS NEEDED FOR NAUSEA 05/29/16   Alycia Rossetti, MD  oxyCODONE (OXY IR/ROXICODONE) 5 MG immediate release tablet Take 1 tablet (5 mg total) by mouth every 6 (six) hours as needed for severe pain. 09/04/16   Alycia Rossetti, MD  potassium chloride (K-DUR,KLOR-CON) 10 MEQ tablet Take 1 tablet (10 mEq total) by mouth daily. 06/17/16   Alycia Rossetti, MD  pravastatin (PRAVACHOL) 20 MG tablet TAKE ONE TABLET BY MOUTH DAILY FOR CHOLESTEROL 06/17/16   Alycia Rossetti, MD    Family History Family History  Problem Relation Age of Onset  . Heart disease Mother     enlarged  . Hypertension Mother   . Diabetes Father   . Diabetes Sister   . Diabetes Maternal Grandmother   . Colon cancer Neg Hx     Social History Social History  Substance Use Topics  . Smoking status: Former Smoker    Quit date: 09/26/2010  . Smokeless tobacco: Never Used  . Alcohol use No     Allergies   Bactrim [sulfamethoxazole-trimethoprim]; Cefixime; Codeine; Lactose; Nitrofuran derivatives; Penicillins; and Sulfa antibiotics   Review of Systems Review of Systems  All other systems reviewed and are negative.    Physical Exam Updated Vital Signs BP 134/86 (BP Location: Left Arm)   Pulse 73   Temp 98 F (36.7 C) (Oral)   Resp 16   SpO2 100%   Physical Exam  Constitutional: She is oriented to person, place, and time. She appears well-developed and well-nourished.  HENT:  Head: Normocephalic and atraumatic.  Right Ear: Tympanic membrane and  external ear normal.  Left Ear: Tympanic membrane and external ear normal.  Nose: Nose normal. Right sinus exhibits no maxillary sinus tenderness and no frontal sinus tenderness. Left sinus exhibits no maxillary sinus tenderness and no frontal sinus tenderness.  Eyes: Conjunctivae and EOM are normal. Pupils are equal, round, and reactive to light. Right eye exhibits no nystagmus. Left eye exhibits no nystagmus.  Neck: Normal range of motion. Neck supple.  Cardiovascular: Normal rate, regular rhythm, normal heart sounds and intact distal pulses.   Pulmonary/Chest: Effort normal and breath sounds normal. No respiratory distress. She exhibits no tenderness.  Abdominal: Soft. Bowel sounds are normal. She exhibits no distension  and no mass. There is no tenderness.  Musculoskeletal: Normal range of motion. She exhibits no edema or tenderness.  Neurological: She is alert and oriented to person, place, and time. She has normal strength and normal reflexes. No sensory deficit. She displays a negative Romberg sign. GCS eye subscore is 4. GCS verbal subscore is 5. GCS motor subscore is 6. She displays no Babinski's sign on the right side. She displays no Babinski's sign on the left side.  Reflex Scores:      Tricep reflexes are 2+ on the right side and 2+ on the left side.      Bicep reflexes are 2+ on the right side and 2+ on the left side.      Brachioradialis reflexes are 2+ on the right side and 2+ on the left side.      Patellar reflexes are 2+ on the right side and 2+ on the left side.      Achilles reflexes are 2+ on the right side and 2+ on the left side. Patient with normal gait without ataxia, shuffling, spasm, or antalgia. Speech is normal without dysarthria, dysphasia, or aphasia. Muscle strength is 5/5 in bilateral shoulders, elbow flexor and extensors, wrist flexor and extensors, and intrinsic hand muscles. 5/5 bilateral lower extremity hip flexors, extensors, knee flexors and extensors, and  ankle dorsi and plantar flexors.    Skin: Skin is warm and dry. No rash noted.  Psychiatric: She has a normal mood and affect. Her behavior is normal. Judgment and thought content normal.  Nursing note and vitals reviewed.    ED Treatments / Results  Labs (all labs ordered are listed, but only abnormal results are displayed) Labs Reviewed  CBC - Abnormal; Notable for the following:       Result Value   MCV 73.5 (*)    MCH 25.7 (*)    RDW 16.9 (*)    All other components within normal limits  COMPREHENSIVE METABOLIC PANEL    EKG  EKG Interpretation  Date/Time:  Monday September 30 2016 20:09:05 EDT Ventricular Rate:  60 PR Interval:    QRS Duration: 88 QT Interval:  414 QTC Calculation: 414 R Axis:   68 Text Interpretation:  Sinus rhythm Confirmed by Dionisio Aragones MD, Andee Poles [id number]) on 09/30/2016 8:59:19 PM       Radiology Ct Head Wo Contrast  Result Date: 09/30/2016 CLINICAL DATA:  66 year old hypertensive female with facial numbness onset this afternoon. Urothelial tumor post chemotherapy and radiation therapy. Initial encounter. EXAM: CT HEAD WITHOUT CONTRAST TECHNIQUE: Contiguous axial images were obtained from the base of the skull through the vertex without intravenous contrast. COMPARISON:  06/15/2004. FINDINGS: Brain: No intracranial hemorrhage or CT evidence of large acute infarct. Mild chronic microvascular changes. Mild global atrophy without hydrocephalus. No intracranial mass lesion noted on this unenhanced exam. Vascular: Vascular calcification Skull: Negative Sinuses/Orbits: Visualized orbital structures unremarkable. Visualized paranasal sinuses clear. Clear. Other: Negative. IMPRESSION: No intracranial hemorrhage or CT evidence of large acute infarct. Mild chronic microvascular changes. Mild global atrophy. Electronically Signed   By: Genia Del M.D.   On: 09/30/2016 20:35    Procedures Procedures (including critical care time)  Medications Ordered in ED Medications -  No data to display   Initial Impression / Assessment and Plan / ED Course  I have reviewed the triage vital signs and the nursing notes.  Pertinent labs & imaging results that were available during my care of the patient were reviewed by me and considered in my  medical decision making (see chart for details).     66 year old female with history oral paresthesias today. There were no lateralizing symptoms. Neurological exam revealed a normal exam. ABCD2 score -2 low risk, mild hypokalemia which may be causing symptoms or could have been a result of hyperventilation. Patient advised regarding need for close follow-up and return precautions. Final Clinical Impressions(s) / ED Diagnoses   Final diagnoses:  Paresthesia    New Prescriptions New Prescriptions   No medications on file     Pattricia Boss, MD 09/30/16 2142

## 2016-10-02 NOTE — Telephone Encounter (Signed)
Refills are due Saturday.  Pt calling again.  Leaving Monday to go out of town and wants to get them picked up.

## 2016-10-03 MED ORDER — MORPHINE SULFATE ER 15 MG PO TBCR: 15.0000 mg | EXTENDED_RELEASE_TABLET | Freq: Two times a day (BID) | ORAL | 0 refills | Status: DC

## 2016-10-03 MED ORDER — OXYCODONE HCL 5 MG PO TABS: 5.0000 mg | ORAL_TABLET | Freq: Four times a day (QID) | ORAL | 0 refills | Status: DC | PRN

## 2016-10-03 NOTE — Telephone Encounter (Signed)
Pt called and made aware Rx's ready for pick up tomorrow after 10AM

## 2016-10-03 NOTE — Telephone Encounter (Signed)
Ok to refill 

## 2016-10-04 ENCOUNTER — Telehealth: Payer: Self-pay | Admitting: Family Medicine

## 2016-10-04 NOTE — Telephone Encounter (Signed)
Called pt and let her know her scripts are available for pick up.

## 2016-10-07 ENCOUNTER — Ambulatory Visit (INDEPENDENT_AMBULATORY_CARE_PROVIDER_SITE_OTHER): Payer: Medicare Other | Admitting: Family Medicine

## 2016-10-07 VITALS — BP 146/106 | HR 76 | Temp 97.6°F | Resp 18 | Wt 187.0 lb

## 2016-10-07 DIAGNOSIS — R202 Paresthesia of skin: Secondary | ICD-10-CM

## 2016-10-07 DIAGNOSIS — K529 Noninfective gastroenteritis and colitis, unspecified: Secondary | ICD-10-CM | POA: Diagnosis not present

## 2016-10-07 MED ORDER — LIDOCAINE 5 % EX OINT: 1.0000 "application " | TOPICAL_OINTMENT | CUTANEOUS | 0 refills | Status: DC | PRN

## 2016-10-07 MED ORDER — CHOLESTYRAMINE 4 G PO PACK
4.0000 g | PACK | Freq: Three times a day (TID) | ORAL | 12 refills | Status: DC
Start: 2016-10-07 — End: 2016-12-10

## 2016-10-07 NOTE — Progress Notes (Signed)
Subjective:    Patient ID: [name], female    DOB: [birth year], 66 y.o.   MRN: [id number]  HPI  Patient was in her normal state of health last week when suddenly she developed perioral paresthesias. This occurred on both sides of her face in her left and right cheek and down her left and right jaw he describes as a pins and needles tingling sensation. She reports some mild numbness. Patient became panicky and grossly emergency room concerned she was having a stroke. At the emergency room, symptoms slowly subsided. Patient had a CAT scan of the head that was negative for any acute intracranial pathology. Lab work was significant for moderate hypokalemia with potassium of 2.9. Patient states that that day that she had taken to Lasix and had several episodes of diarrhea which could've been contributing to her hypokalemia. Since receiving potassium replacement, she's had no further paresthesias. Today on examination, cranial nerves II through XII are grossly intact with muscle strength 5 over 5 equal and symmetric in the upper and lower extremities. She has normal cerebellar exam. She is adamant that the sensation was bilateral. There was no unilateral features concerning for stroke. She denied any slurring speech, facial asymmetry, blurry vision, double vision. She reports no difficulty swallowing or trouble breathing. She denies any angioedema when the symptoms occurred. She does complain of diarrhea that has been up chronic problem for her ever since she had numerous abdominal surgeries and resume normal diet after being on TPN for over a year. She takes 6 Imodium as a day with no relief Past Medical History:  Diagnosis Date  . Acid reflux   . Bowel obstruction several   Recurrent SBO secondary to adhesions  . Cancer [facility]) 2011 [facility])   diagnosed in 2009 per pt. Invasive High grade Urothelial carcinoma- s/p radiation and Chemo   . Chronic back pain   . DVT (deep venous thrombosis)  ([place]) 2015  . Fatty liver   . Hyperlipidemia   . Hypertension    pt says taken off medication since has lost weight.  . Hypothyroidism   . Internal hemorrhoids   . Leukopenia 12/06/2011   HIV serology negative  . Malnutrition ([place])   . PE (pulmonary embolism)    [facility] records  . Rectal ulcer 2016   [facility]  . UTI (lower urinary tract infection)    Recurrent   Past Surgical History:  Procedure Laterality Date  . ABDOMINAL SURGERY     with intestinal "puncture" x 2, exploratory surgery   . BACK SURGERY     X2  . BLADDER REMOVAL    . CHOLECYSTECTOMY    . CYSTECTOMY     breast  . HERNIA REPAIR     mesh  . ILEO CONDUIT     For bladder cancer  . PORTACATH PLACEMENT     Current Outpatient Prescriptions on File Prior to Visit  Medication Sig Dispense Refill  . apixaban (ELIQUIS) 5 MG TABS tablet Take 1 tablet (5 mg total) by mouth 2 (two) times daily. 180 tablet 3  . dexlansoprazole (DEXILANT) 60 MG capsule Take 1 capsule (60 mg total) by mouth daily. 90 capsule 1  . furosemide (LASIX) 40 MG tablet Take 1 tablet (40 mg total) by mouth 2 (two) times daily as needed for fluid. 180 tablet 1  . hydrocortisone 2.5 % cream APPLY RECTALLY TWICE DAILY AS NEEDED 28.35 g 11  . levothyroxine (SYNTHROID, LEVOTHROID) 112 MCG tablet Take 1 tablet (  112 mcg total) by mouth daily. 90 tablet 3  . Magnesium Oxide 400 (240 Mg) MG TABS TAKE TWO (2) TABLETS BY MOUTH TWICE DAILY 120 tablet 1  . morphine (MS CONTIN) 15 MG 12 hr tablet Take 1 tablet (15 mg total) by mouth every 12 (twelve) hours. 60 tablet 0  . ondansetron (ZOFRAN-ODT) 8 MG disintegrating tablet TAKE ONE TABLET BY MOUTH EVERY 8 HOURS AS NEEDED FOR NAUSEA 45 tablet 2  . oxyCODONE (OXY IR/ROXICODONE) 5 MG immediate release tablet Take 1 tablet (5 mg total) by mouth every 6 (six) hours as needed for severe pain. 100 tablet 0  . potassium chloride (K-DUR,KLOR-CON) 10 MEQ tablet Take 1 tablet (10 mEq total) by mouth 3 (three) times  daily. 90 tablet 3  . pravastatin (PRAVACHOL) 20 MG tablet TAKE ONE TABLET BY MOUTH DAILY FOR CHOLESTEROL 90 tablet 2  . methocarbamol (ROBAXIN) 500 MG tablet Take 1 tablet (500 mg total) by mouth 2 (two) times daily. (Patient not taking: Reported on 10/07/2016) 20 tablet 0   No current facility-administered medications on file prior to visit.    Allergies  Allergen Reactions  . Bactrim [Sulfamethoxazole-Trimethoprim]   . Cefixime Diarrhea  . Codeine Hives  . Lactose Diarrhea  . Nitrofuran Derivatives Itching  . Penicillins Hives  . Sulfa Antibiotics Hives   Social History   Social History  . Marital status: Widowed    Spouse name: N/A  . Number of children: 2  . Years of education: N/A   Occupational History  .  Unemployed   Social History Main Topics  . Smoking status: Former Smoker    Quit date: 09/26/2010  . Smokeless tobacco: Never Used  . Alcohol use No  . Drug use: No  . Sexual activity: Yes    Birth control/ protection: Post-menopausal   Other Topics Concern  . Not on file   Social History Narrative  . No narrative on file       Review of Systems  All other systems reviewed and are negative.      Objective:   Physical Exam  Constitutional: She is oriented to person, place, and time.  Eyes: Conjunctivae and EOM are normal. Pupils are equal, round, and reactive to light. Right eye exhibits no discharge. Left eye exhibits no discharge. No scleral icterus.  Cardiovascular: Normal rate, regular rhythm and normal heart sounds.   No murmur heard. Pulmonary/Chest: Effort normal and breath sounds normal. No respiratory distress. She has no wheezes. She has no rales. She exhibits no tenderness.  Abdominal: Soft. Bowel sounds are normal. She exhibits no distension and no mass. There is no tenderness. There is no rebound and no guarding.  Neurological: She is alert and oriented to person, place, and time. She has normal reflexes. She displays normal reflexes. No  cranial nerve deficit. She exhibits normal muscle tone. Coordination normal.  Skin: Skin is warm. No rash noted. No erythema.  Vitals reviewed.  No bruit       Assessment & Plan:  Paresthesia - Plan: BASIC METABOLIC PANEL WITH GFR  Chronic diarrhea  Symptoms are consistent with perioral paresthesias. Given the bilateral nature, I do not believe the patient was experiencing a TIA. Rather I believe that she may been experiencing symptoms secondary to her hypokalemia. Symptoms have now resolved. I will repeat a BMP as the patient may need to be on chronic potassium repletion given her history of diarrhea. Also recommended trying cholestyramine 1 packet 3 times a day for chronic diarrhea

## 2016-10-08 ENCOUNTER — Telehealth: Payer: Self-pay | Admitting: Family Medicine

## 2016-10-08 ENCOUNTER — Other Ambulatory Visit: Payer: Self-pay | Admitting: Family Medicine

## 2016-10-08 DIAGNOSIS — E876 Hypokalemia: Secondary | ICD-10-CM

## 2016-10-08 LAB — BASIC METABOLIC PANEL WITH GFR
BUN: 16 mg/dL (ref 7–25)
CALCIUM: 8.4 mg/dL — AB (ref 8.6–10.4)
CO2: 27 mmol/L (ref 20–31)
Chloride: 102 mmol/L (ref 98–110)
Creat: 1.06 mg/dL — ABNORMAL HIGH (ref 0.50–0.99)
GFR, EST NON AFRICAN AMERICAN: 55 mL/min — AB (ref >=60)
GFR, Est African American: 64 mL/min (ref >=60)
Glucose, Bld: 95 mg/dL (ref 70–99)
Potassium: 3.8 mmol/L (ref 3.5–5.3)
Sodium: 139 mmol/L (ref 135–146)

## 2016-10-08 NOTE — Telephone Encounter (Signed)
-----   Message from Susy Frizzle, MD sent at 10/08/2016  6:26 AM EDT ----- Potassium is better.  I would continue kdur 10 meq poqday and recheck bmp in 1 month

## 2016-10-08 NOTE — Telephone Encounter (Signed)
Pt aware of lab results and provider recommendations.  Rx for potassium already at pharmacy

## 2016-11-01 ENCOUNTER — Telehealth: Payer: Self-pay | Admitting: Family Medicine

## 2016-11-01 MED ORDER — MORPHINE SULFATE ER 15 MG PO TBCR: 15.0000 mg | EXTENDED_RELEASE_TABLET | Freq: Two times a day (BID) | ORAL | 0 refills | Status: DC

## 2016-11-01 MED ORDER — OXYCODONE HCL 5 MG PO TABS: 5.0000 mg | ORAL_TABLET | Freq: Four times a day (QID) | ORAL | 0 refills | Status: DC | PRN

## 2016-11-01 NOTE — Telephone Encounter (Signed)
Pt needs refill on oxycodone and morphine.

## 2016-11-01 NOTE — Telephone Encounter (Signed)
Prescription printed and patient made aware to come to office to pick up on 11/04/2016.

## 2016-11-01 NOTE — Telephone Encounter (Signed)
Ok to refill??  Last office visit 08/09/2016.  Last refill on Oxycodone 10/05/2016.  Last refill on MS Contin 10/05/2016.

## 2016-11-01 NOTE — Telephone Encounter (Signed)
Okay to refill? 

## 2016-11-01 NOTE — Addendum Note (Signed)
Addended by: Sheral Flow on: 11/01/2016 04:20 PM   Modules accepted: Orders

## 2016-11-06 ENCOUNTER — Other Ambulatory Visit: Payer: Self-pay | Admitting: Family Medicine

## 2016-11-08 DIAGNOSIS — Z936 Other artificial openings of urinary tract status: Secondary | ICD-10-CM | POA: Diagnosis not present

## 2016-11-08 DIAGNOSIS — C679 Malignant neoplasm of bladder, unspecified: Secondary | ICD-10-CM | POA: Diagnosis not present

## 2016-12-06 ENCOUNTER — Other Ambulatory Visit: Payer: Self-pay | Admitting: Family Medicine

## 2016-12-10 ENCOUNTER — Ambulatory Visit (INDEPENDENT_AMBULATORY_CARE_PROVIDER_SITE_OTHER): Payer: Medicare Other | Admitting: Family Medicine

## 2016-12-10 ENCOUNTER — Encounter: Payer: Self-pay | Admitting: Family Medicine

## 2016-12-10 ENCOUNTER — Ambulatory Visit (HOSPITAL_COMMUNITY)
Admission: RE | Admit: 2016-12-10 | Discharge: 2016-12-10 | Disposition: A | Discharge status: Home / Self Care | Payer: Medicare Other | Source: Ambulatory Visit | Attending: Family Medicine | Admitting: Family Medicine

## 2016-12-10 VITALS — BP 140/92 | HR 84 | Temp 97.5°F | Resp 16 | Ht 65.5 in | Wt 184.0 lb

## 2016-12-10 DIAGNOSIS — R197 Diarrhea, unspecified: Secondary | ICD-10-CM

## 2016-12-10 DIAGNOSIS — E038 Other specified hypothyroidism: Secondary | ICD-10-CM | POA: Diagnosis not present

## 2016-12-10 DIAGNOSIS — R600 Localized edema: Secondary | ICD-10-CM

## 2016-12-10 DIAGNOSIS — K6289 Other specified diseases of anus and rectum: Secondary | ICD-10-CM | POA: Diagnosis not present

## 2016-12-10 DIAGNOSIS — R609 Edema, unspecified: Secondary | ICD-10-CM | POA: Diagnosis not present

## 2016-12-10 DIAGNOSIS — G8929 Other chronic pain: Secondary | ICD-10-CM | POA: Diagnosis not present

## 2016-12-10 MED ORDER — DIPHENOXYLATE-ATROPINE 2.5-0.025 MG PO TABS: 1.0000 | ORAL_TABLET | Freq: Three times a day (TID) | ORAL | 1 refills | Status: DC | PRN

## 2016-12-10 MED ORDER — LIDOCAINE 5 % EX OINT: TOPICAL_OINTMENT | CUTANEOUS | 1 refills | Status: DC

## 2016-12-10 MED ORDER — OXYCODONE HCL 5 MG PO TABS: 5.0000 mg | ORAL_TABLET | Freq: Four times a day (QID) | ORAL | 0 refills | Status: DC | PRN

## 2016-12-10 MED ORDER — MORPHINE SULFATE ER 15 MG PO TBCR: 15.0000 mg | EXTENDED_RELEASE_TABLET | Freq: Two times a day (BID) | ORAL | 0 refills | Status: DC

## 2016-12-10 NOTE — Patient Instructions (Addendum)
Probiotics- such as Electronics engineer for gas  Try the lomotil for diarrhea  Take potassium with the lasix Increase your water  Get the xray done at [facility] for your bowels  F/U pending results

## 2016-12-10 NOTE — Assessment & Plan Note (Signed)
Refilled lidocaine, on chronic pain meds for this and chronic abdominal pain

## 2016-12-10 NOTE — Assessment & Plan Note (Signed)
Check renal function Continue daily lasix, low sodium diet, no history of heart failure

## 2016-12-10 NOTE — Progress Notes (Signed)
   Subjective:    Patient ID: [name], female    DOB: 06-Feb-1951, 66 y.o.   MRN: [id number]  Patient presents for Edema (x5 months- BLE edema); Diarrhea; and Pain (hemorrhoidal pain)   Has multiple bowel movements a day, and very gassy 10-20 a day.His history of chronic diarrhea has been on multiple medications she will often alternate between constipation and diarrhea found him much pain medication she is using. The Imodium has not helped this time. She has not been on any recent antibiotics. She denies any nausea vomiting no abdominal pain no fever. She admits that she has been eating a lot of fried foods breads things that she is had not been eating large quantities of's says she is went to [place] and then when she returned home she has had some family gatherings.  Hemorroid pain and chronic Pain. She is out of her lidocaine ointment needs this refilled. She's also had some swelling in her ankles which is also chronic and intermittent. The right side swells more than the left. She does take the Lasix as needed.  Due for repeat TFT   Review Of Systems:  GEN- denies fatigue, fever, weight loss,weakness, recent illness HEENT- denies eye drainage, change in vision, nasal discharge, CVS- denies chest pain, palpitations RESP- denies SOB, cough, wheeze ABD- denies N/V, +change in stools, abd pain GU- denies dysuria, hematuria, dribbling, incontinence MSK- denies joint pain, muscle aches, injury Neuro- denies headache, dizziness, syncope, seizure activity       Objective:    BP (!) 140/92   Pulse 84   Temp 97.5 F (36.4 C) (Oral)   Resp 16   Ht 5' 5.5" (1.664 m)   Wt 184 lb (83.5 kg)   SpO2 98%   BMI 30.15 kg/m  GEN- NAD, alert and oriented x3 HEENT- PERRL, EOMI, non injected sclera, pink conjunctiva, MMM, oropharynx clear Neck- Supple, no thyromegaly CVS- RRR, no murmur RESP-CTAB ABD Hypoactive bowel sounds , soft.ND. Urostomy in tact, urine clear  Ext- Pedal edema-  R>L Pulses- Radial, DP- 2+        Assessment & Plan:      Problem List Items Addressed This Visit    Peripheral edema    Check renal function Continue daily lasix, low sodium diet, no history of heart failure      Hypothyroidism    Recheck TFT      Relevant Orders   TSH   T3, free   T4, free   Diarrhea - Primary    Hypoactive BS today, which is concerning, she has had multiple SBO. Possible too, constipated and leaking stool around Send for abdominal xray to evalaute She is very embarrassed by her gassiness that it is along with her bowel sounds. Advised that she can try a probiotic to see if this helps with the gas but this has been a problem going on for greater than 10 years. I also prescribed Lomotil which she is not to take until she has her x-ray      Relevant Orders   DG Abd 1 View (Completed)   CBC with Differential/Platelet   Comprehensive metabolic panel   Chronic rectal pain    Refilled lidocaine, on chronic pain meds for this and chronic abdominal pain         Note: This dictation was prepared with Dragon dictation along with smaller phrase technology. Any transcriptional errors that result from this process are unintentional.

## 2016-12-10 NOTE — Assessment & Plan Note (Addendum)
Hypoactive BS today, which is concerning, she has had multiple SBO. Possible too, constipated and leaking stool around Send for abdominal xray to evalaute She is very embarrassed by her gassiness that it is along with her bowel sounds. Advised that she can try a probiotic to see if this helps with the gas but this has been a problem going on for greater than 10 years. I also prescribed Lomotil which she is not to take until she has her x-ray  Also discussed dietary changes, moving her back to soft foods, no meats, bland foods, this tends to help get her GI tract back on track

## 2016-12-10 NOTE — Assessment & Plan Note (Signed)
Recheck TFT 

## 2016-12-11 ENCOUNTER — Encounter (HOSPITAL_COMMUNITY): Payer: Self-pay | Admitting: *Deleted

## 2016-12-11 ENCOUNTER — Emergency Department (HOSPITAL_COMMUNITY): Payer: Medicare Other

## 2016-12-11 ENCOUNTER — Emergency Department (HOSPITAL_COMMUNITY)
Admission: EM | Admit: 2016-12-11 | Discharge: 2016-12-11 | Disposition: A | Discharge status: Home / Self Care | Payer: Medicare Other | Attending: Emergency Medicine | Admitting: Emergency Medicine

## 2016-12-11 DIAGNOSIS — K529 Noninfective gastroenteritis and colitis, unspecified: Secondary | ICD-10-CM | POA: Diagnosis not present

## 2016-12-11 DIAGNOSIS — Z8551 Personal history of malignant neoplasm of bladder: Secondary | ICD-10-CM | POA: Insufficient documentation

## 2016-12-11 DIAGNOSIS — I10 Essential (primary) hypertension: Secondary | ICD-10-CM | POA: Diagnosis not present

## 2016-12-11 DIAGNOSIS — E039 Hypothyroidism, unspecified: Secondary | ICD-10-CM | POA: Diagnosis not present

## 2016-12-11 DIAGNOSIS — Z87891 Personal history of nicotine dependence: Secondary | ICD-10-CM | POA: Diagnosis not present

## 2016-12-11 DIAGNOSIS — Z79899 Other long term (current) drug therapy: Secondary | ICD-10-CM | POA: Diagnosis not present

## 2016-12-11 DIAGNOSIS — Z7901 Long term (current) use of anticoagulants: Secondary | ICD-10-CM | POA: Insufficient documentation

## 2016-12-11 DIAGNOSIS — R109 Unspecified abdominal pain: Secondary | ICD-10-CM | POA: Diagnosis not present

## 2016-12-11 DIAGNOSIS — R1013 Epigastric pain: Secondary | ICD-10-CM | POA: Diagnosis present

## 2016-12-11 DIAGNOSIS — R197 Diarrhea, unspecified: Secondary | ICD-10-CM | POA: Diagnosis not present

## 2016-12-11 LAB — CBC
HCT: 36.8 % (ref 36.0–46.0)
Hemoglobin: 12.6 g/dL (ref 12.0–15.0)
MCH: 25.6 pg — AB (ref 26.0–34.0)
MCHC: 34.2 g/dL (ref 30.0–36.0)
MCV: 74.6 fL — ABNORMAL LOW (ref 78.0–100.0)
PLATELETS: 336 10*3/uL (ref 150–400)
RBC: 4.93 MIL/uL (ref 3.87–5.11)
RDW: 16.4 % — AB (ref 11.5–15.5)
WBC: 8.3 10*3/uL (ref 4.0–10.5)

## 2016-12-11 LAB — T3, FREE: T3, Free: 2.6 pg/mL (ref 2.3–4.2)

## 2016-12-11 LAB — URINALYSIS, ROUTINE W REFLEX MICROSCOPIC
BILIRUBIN URINE: NEGATIVE
Glucose, UA: NEGATIVE mg/dL
Ketones, ur: NEGATIVE mg/dL
Nitrite: POSITIVE — AB
Protein, ur: 30 mg/dL — AB
SPECIFIC GRAVITY, URINE: 1.013 (ref 1.005–1.030)
pH: 5 (ref 5.0–8.0)

## 2016-12-11 LAB — CBC WITH DIFFERENTIAL/PLATELET
BASOS PCT: 0 %
Basophils Absolute: 0 cells/uL (ref 0–200)
EOS ABS: 142 {cells}/uL (ref 15–500)
EOS PCT: 2 %
HCT: 37.9 % (ref 35.0–45.0)
Hemoglobin: 12.2 g/dL (ref 12.0–15.0)
Lymphocytes Relative: 21 %
Lymphs Abs: 1491 cells/uL (ref 850–3900)
MCH: 25.4 pg — ABNORMAL LOW (ref 27.0–33.0)
MCHC: 32.2 g/dL (ref 32.0–36.0)
MCV: 78.8 fL — ABNORMAL LOW (ref 80.0–100.0)
MONOS PCT: 6 %
MPV: 10.1 fL (ref 7.5–12.5)
Monocytes Absolute: 426 cells/uL (ref 200–950)
NEUTROS ABS: 5041 {cells}/uL (ref 1500–7800)
Neutrophils Relative %: 71 %
PLATELETS: 349 10*3/uL (ref 140–400)
RBC: 4.81 MIL/uL (ref 3.80–5.10)
RDW: 17.1 % — ABNORMAL HIGH (ref 11.0–15.0)
WBC: 7.1 10*3/uL (ref 3.8–10.8)

## 2016-12-11 LAB — COMPREHENSIVE METABOLIC PANEL
ALK PHOS: 80 U/L (ref 33–130)
ALK PHOS: 74 U/L (ref 38–126)
ALT: 11 U/L (ref 6–29)
ALT: 13 U/L — AB (ref 14–54)
AST: 12 U/L (ref 10–35)
AST: 14 U/L — ABNORMAL LOW (ref 15–41)
Albumin: 3.8 g/dL (ref 3.6–5.1)
Albumin: 3.8 g/dL (ref 3.5–5.0)
Anion gap: 9 (ref 5–15)
BUN: 16 mg/dL (ref 7–25)
BUN: 13 mg/dL (ref 6–20)
CALCIUM: 8.7 mg/dL — AB (ref 8.9–10.3)
CO2: 22 mmol/L (ref 20–31)
CO2: 24 mmol/L (ref 22–32)
CREATININE: 1.09 mg/dL — AB (ref 0.50–0.99)
CREATININE: 1 mg/dL (ref 0.44–1.00)
Calcium: 8.4 mg/dL — ABNORMAL LOW (ref 8.6–10.4)
Chloride: 107 mmol/L (ref 98–110)
Chloride: 108 mmol/L (ref 101–111)
GFR, EST NON AFRICAN AMERICAN: 58 mL/min — AB (ref >60)
Glucose, Bld: 104 mg/dL — ABNORMAL HIGH (ref 70–99)
Glucose, Bld: 94 mg/dL (ref 65–99)
Potassium: 3.4 mmol/L — ABNORMAL LOW (ref 3.5–5.3)
Potassium: 3.4 mmol/L — ABNORMAL LOW (ref 3.5–5.1)
SODIUM: 140 mmol/L (ref 135–146)
Sodium: 141 mmol/L (ref 135–145)
TOTAL PROTEIN: 6.8 g/dL (ref 6.1–8.1)
Total Bilirubin: 0.5 mg/dL (ref 0.2–1.2)
Total Bilirubin: 1.2 mg/dL (ref 0.3–1.2)
Total Protein: 7.5 g/dL (ref 6.5–8.1)

## 2016-12-11 LAB — T4, FREE: Free T4: 1.2 ng/dL (ref 0.8–1.8)

## 2016-12-11 LAB — LIPASE, BLOOD: Lipase: 16 U/L (ref 11–51)

## 2016-12-11 LAB — TSH: TSH: 5.84 m[IU]/L — AB

## 2016-12-11 MED ORDER — CIPROFLOXACIN HCL 500 MG PO TABS: ORAL_TABLET | ORAL | 0 refills | Status: DC

## 2016-12-11 MED ORDER — METRONIDAZOLE 500 MG PO TABS
500.0000 mg | ORAL_TABLET | Freq: Once | ORAL | Status: AC
  Administered 2016-12-11: 500 mg via ORAL
  Filled 2016-12-11: qty 1

## 2016-12-11 MED ORDER — CIPROFLOXACIN HCL 250 MG PO TABS
500.0000 mg | ORAL_TABLET | Freq: Once | ORAL | Status: AC
  Administered 2016-12-11: 500 mg via ORAL
  Filled 2016-12-11: qty 2

## 2016-12-11 MED ORDER — SODIUM CHLORIDE 0.9 % IV BOLUS (SEPSIS)
1000.0000 mL | Freq: Once | INTRAVENOUS | Status: AC
  Administered 2016-12-11: 1000 mL via INTRAVENOUS

## 2016-12-11 MED ORDER — IOPAMIDOL (ISOVUE-300) INJECTION 61%
100.0000 mL | Freq: Once | INTRAVENOUS | Status: AC | PRN
  Administered 2016-12-11: 100 mL via INTRAVENOUS

## 2016-12-11 MED ORDER — METRONIDAZOLE 500 MG PO TABS: ORAL_TABLET | ORAL | 0 refills | Status: DC

## 2016-12-11 NOTE — ED Triage Notes (Signed)
Pt comes in with lower abdominal starting last night. She has had diarrhea over a week. She was seen at her PCP's office yesterday and she was sent here for an abdominal xray. Xray suggests followup with a CT.

## 2016-12-11 NOTE — ED Notes (Addendum)
Pt states she has diarrhea everyday (her normal) but has had increased diarrhea since last Thursday with lower abd pain and greenish colored stool.  Pt has urostomy bag in place. Pt does not have current GI doctor.

## 2016-12-11 NOTE — ED Provider Notes (Signed)
[facility] DEPT Provider Note   CSN: [id number] Arrival date & time: 12/11/16  1307     History   Chief Complaint Chief Complaint  Patient presents with  . Abdominal Pain    HPI AREATHA KALATA is a 66 y.o. female.  Patient complains of abdominal pain. Patient also has diarrhea for a number days.   The history is provided by the patient.  Abdominal Pain   This is a new problem. The current episode started more than 2 days ago. The problem occurs constantly. The problem has not changed since onset.The pain is associated with an unknown factor. The pain is located in the epigastric region. The quality of the pain is dull. The pain is at a severity of 4/10. The pain is moderate. Associated symptoms include anorexia and diarrhea. Pertinent negatives include frequency, hematuria and headaches.    Past Medical History:  Diagnosis Date  . Acid reflux   . Bowel obstruction (HCC) several   Recurrent SBO secondary to adhesions  . Cancer [facility]) 2011 [facility])   diagnosed in 2009 per pt. Invasive High grade Urothelial carcinoma- s/p radiation and Chemo   . Chronic back pain   . DVT (deep venous thrombosis) ([place]) 2015  . Fatty liver   . Hyperlipidemia   . Hypertension    pt says taken off medication since has lost weight.  . Hypothyroidism   . Internal hemorrhoids   . Leukopenia 12/06/2011   HIV serology negative  . Malnutrition ([place])   . PE (pulmonary embolism)    PER [facility] records  . Rectal ulcer 2016   [facility]  . UTI (lower urinary tract infection)    Recurrent    Patient Active Problem List   Diagnosis Date Noted  . Peripheral edema 05/27/2016  . Chronic rectal pain 06/02/2015  . Rectal ulcer 11/08/2014  . GERD (gastroesophageal reflux disease) 11/08/2014  . Fistula 06/27/2014  . Protein-calorie malnutrition ([place]) 06/27/2014  . Parotid mass 06/27/2014  . BV (bacterial vaginosis) 04/05/2014  . Lung nodule 04/05/2014  . Loss of weight 02/09/2014  .  Grief reaction 01/28/2014  . Leg swelling 01/14/2014  . Lung infiltrate on CT 01/14/2014  . DVT (deep venous thrombosis) ([place]) 12/17/2013  . Partial small bowel obstruction ([place]) 07/21/2013  . Chronic abdominal pain 09/23/2012  . Microcytic anemia 08/26/2012  . Encounter for screening colonoscopy 05/26/2012  . Occlusion of peripherally inserted central catheter (PICC) line ([place]) 02/27/2012  . Post-menopausal bleeding 12/19/2011  . Recurrent UTI 12/05/2011  . Diarrhea 12/05/2011  . SBO (small bowel obstruction) ([place]) 10/02/2011  . Fatty liver 10/02/2011  . Elevated LFTs 09/03/2011  . Hx of bladder cancer 09/03/2011  . Bowel obstruction ([place]) 09/03/2011  . On total parenteral nutrition (TPN) 09/03/2011  . Hyperlipidemia 01/23/2007  . TOBACCO USE 08/22/2006  . Hypothyroidism 07/21/2006  . Essential hypertension 07/21/2006  . ARTHRITIS 07/21/2006    Past Surgical History:  Procedure Laterality Date  . ABDOMINAL SURGERY     with intestinal "puncture" x 2, exploratory surgery   . BACK SURGERY     X2  . BLADDER REMOVAL    . CHOLECYSTECTOMY    . CYSTECTOMY     breast  . HERNIA REPAIR     mesh  . ILEO CONDUIT     For bladder cancer  . PORTACATH PLACEMENT      OB History    No data available       Home Medications    Prior to Admission  medications   Medication Sig Start Date End Date Taking? Authorizing Provider  apixaban (ELIQUIS) 5 MG TABS tablet Take 1 tablet (5 mg total) by mouth 2 (two) times daily. 06/17/16  Yes [place], Modena Nunnery, MD  DEXILANT 60 MG capsule TAKE ONE CAPSULE BY MOUTH DAILY 12/06/16  Yes [place], Modena Nunnery, MD  furosemide (LASIX) 40 MG tablet Take 1 tablet (40 mg total) by mouth 2 (two) times daily as needed for fluid. 06/17/16  Yes [place], Modena Nunnery, MD  hydrocortisone 2.5 % cream APPLY RECTALLY TWICE DAILY AS NEEDED 09/02/16  Yes [place], Modena Nunnery, MD  levothyroxine (SYNTHROID, LEVOTHROID) 112 MCG tablet Take 1 tablet (112 mcg total) by mouth daily.  06/17/16  Yes [place], Modena Nunnery, MD  lidocaine (XYLOCAINE) 5 % ointment APPLY AS DIRECTED AS NEEDED.PLEASE DISPENSE 3 TUBES 12/10/16  Yes [place], Modena Nunnery, MD  Magnesium Oxide 400 (240 Mg) MG TABS TAKE TWO (2) TABLETS BY MOUTH TWICE DAILY 06/10/16  Yes [place], Modena Nunnery, MD  morphine (MS CONTIN) 15 MG 12 hr tablet Take 1 tablet (15 mg total) by mouth every 12 (twelve) hours. 12/10/16  Yes [place], Modena Nunnery, MD  Multiple Vitamins-Minerals (MULTIVITAMIN PO) Take 1 tablet by mouth daily.   Yes [provider]  ondansetron (ZOFRAN-ODT) 8 MG disintegrating tablet TAKE ONE TABLET BY MOUTH EVERY 8 HOURS AS NEEDED FOR NAUSEA 05/29/16  Yes [place], Modena Nunnery, MD  oxyCODONE (OXY IR/ROXICODONE) 5 MG immediate release tablet Take 1 tablet (5 mg total) by mouth every 6 (six) hours as needed for severe pain. 12/10/16  Yes [place], Modena Nunnery, MD  potassium chloride (K-DUR,KLOR-CON) 10 MEQ tablet Take 1 tablet (10 mEq total) by mouth 3 (three) times daily. 09/30/16  Yes Pattricia Boss, MD  pravastatin (PRAVACHOL) 20 MG tablet TAKE ONE TABLET BY MOUTH DAILY FOR CHOLESTEROL 06/17/16  Yes [place], Modena Nunnery, MD  ciprofloxacin (CIPRO) 500 MG tablet One po bid 12/11/16   Milton Ferguson, MD  diphenoxylate-atropine (LOMOTIL) 2.5-0.025 MG tablet Take 1 tablet by mouth 3 (three) times daily as needed for diarrhea or loose stools. Patient not taking: Reported on 12/11/2016 12/10/16   Alycia Rossetti, MD  metroNIDAZOLE (FLAGYL) 500 MG tablet One po tid 12/11/16   Milton Ferguson, MD    Family History Family History  Problem Relation Age of Onset  . Heart disease Mother        enlarged  . Hypertension Mother   . Diabetes Father   . Diabetes Sister   . Diabetes Maternal Grandmother   . Colon cancer Neg Hx     Social History Social History  Substance Use Topics  . Smoking status: Former Smoker    Quit date: 09/26/2010  . Smokeless tobacco: Never Used  . Alcohol use No     Allergies   Bactrim  [sulfamethoxazole-trimethoprim]; Cefixime; Codeine; Lactose; Nitrofuran derivatives; Penicillins; and Sulfa antibiotics   Review of Systems Review of Systems  Constitutional: Negative for appetite change and fatigue.  HENT: Negative for congestion, ear discharge and sinus pressure.   Eyes: Negative for discharge.  Respiratory: Negative for cough.   Cardiovascular: Negative for chest pain.  Gastrointestinal: Positive for abdominal pain, anorexia and diarrhea.  Genitourinary: Negative for frequency and hematuria.  Musculoskeletal: Negative for back pain.  Skin: Negative for rash.  Neurological: Negative for seizures and headaches.  Psychiatric/Behavioral: Negative for hallucinations.     Physical Exam Updated Vital Signs BP (!) 145/80 (BP Location: Left Arm)   Pulse (!) 59   Temp  98.2 F (36.8 C) (Oral)   Resp 18   Ht 5' 5.5" (1.664 m)   Wt 83.5 kg (184 lb)   SpO2 98%   BMI 30.15 kg/m   Physical Exam  Constitutional: She is oriented to person, place, and time. She appears well-developed.  HENT:  Head: Normocephalic.  Eyes: Conjunctivae and EOM are normal. No scleral icterus.  Neck: Neck supple. No thyromegaly present.  Cardiovascular: Normal rate and regular rhythm.  Exam reveals no gallop and no friction rub.   No murmur heard. Pulmonary/Chest: No stridor. She has no wheezes. She has no rales. She exhibits no tenderness.  Abdominal: She exhibits no distension. There is tenderness. There is no rebound.  Patient with colostomy bag  Musculoskeletal: Normal range of motion. She exhibits no edema.  Lymphadenopathy:    She has no cervical adenopathy.  Neurological: She is oriented to person, place, and time. She exhibits normal muscle tone. Coordination normal.  Skin: No rash noted. No erythema.  Psychiatric: She has a normal mood and affect. Her behavior is normal.     ED Treatments / Results  Labs (all labs ordered are listed, but only abnormal results are  displayed) Labs Reviewed  COMPREHENSIVE METABOLIC PANEL - Abnormal; Notable for the following:       Result Value   Potassium 3.4 (*)    Calcium 8.7 (*)    AST 14 (*)    ALT 13 (*)    GFR calc non Af Amer 58 (*)    All other components within normal limits  CBC - Abnormal; Notable for the following:    MCV 74.6 (*)    MCH 25.6 (*)    RDW 16.4 (*)    All other components within normal limits  URINALYSIS, ROUTINE W REFLEX MICROSCOPIC - Abnormal; Notable for the following:    APPearance CLOUDY (*)    Hgb urine dipstick MODERATE (*)    Protein, ur 30 (*)    Nitrite POSITIVE (*)    Leukocytes, UA MODERATE (*)    Bacteria, UA RARE (*)    Squamous Epithelial / LPF 0-5 (*)    Non Squamous Epithelial 0-5 (*)    All other components within normal limits  URINE CULTURE  LIPASE, BLOOD    EKG  EKG Interpretation None       Radiology Dg Abd 1 View  Result Date: 12/10/2016 CLINICAL DATA:  Diarrhea, rectal pain, and gas intermittently for the past 2 years. History of surgical repair of bowel obstruction, urinary tract malignancy treated with radiation therapy and chemotherapy. EXAM: ABDOMEN - 1 VIEW COMPARISON:  Abdominopelvic CT scan of Nov 10, 2015 FINDINGS: The colonic stool burden is moderate. There is gas and stool in the rectum. There is no small or large bowel obstructive pattern. The bony structures exhibit no acute abnormalities. IMPRESSION: No acute intra-abdominal abnormality is observed. Given the patient's history of thickening of the rectal wall, abdominal and pelvic CT scanning would be a useful next imaging step. Electronically Signed   By: David  Martinique M.D.   On: 12/10/2016 17:05   Ct Abdomen Pelvis W Contrast  Result Date: 12/11/2016 CLINICAL DATA:  Increased diarrhea. Green coloration to the stool. Lower abdominal pain. Urostomy. EXAM: CT ABDOMEN AND PELVIS WITH CONTRAST TECHNIQUE: Multidetector CT imaging of the abdomen and pelvis was performed using the standard  protocol following bolus administration of intravenous contrast. CONTRAST:  169mL ISOVUE-300 IOPAMIDOL (ISOVUE-300) INJECTION 61% COMPARISON:  Multiple exams, including 12/10/2016 radiographs and CT of 11/10/2015  FINDINGS: Lower chest: Unremarkable Hepatobiliary: 8 by 7 mm hypodense lesion anteriorly in the lateral segment left hepatic lobe image 19/2, stable, technically nonspecific due to small size. Fluid density branching lesion in the right hepatic lobe on image 22-24 series 2 appears unchanged and likely benign. Small focus of probable transient hepatic attenuation difference anteriorly in segment 4 of the liver on image 20/2. Stable mild intrahepatic and extrahepatic biliary dilatation, probably a physiologic response to cholecystectomy. Pancreas: Stable appearance of elongated cystic lesion or dilated segment of the ventral pancreatic duct, 9 mm in diameter, and extending about 2.9 cm in length although it opting a curving course as shown on image 37/4. Spleen: Unremarkable Adrenals/Urinary Tract: Multiple tiny hypodense lesions of both kidneys are technically too small to characterize although statistically likely to be benign. Right-sided ileal conduit noted. Bladder surgically absent. Stomach/Bowel: Diffuse wall thickening in the stomach may be due to nondistention. There is abnormal wall thickening in the colon especially involving the descending colon, sigmoid colon, and rectum. Scattered air-fluid levels in the large bowel. Ventral midline hernia along the pelvis containing loops of small bowel with air-fluid levels. There is also some swirling vasculature in the small bowel extending towards this hernia as shown on images 39 through 56 of series 2, potentially reflecting a mild local volvulus ; but this swirl appearance is unchanged compared to the prior exam. I am skeptical of overt obstruction. Vascular/Lymphatic: Aortoiliac atherosclerotic vascular disease. No pathologic adenopathy crash that left  periaortic lymph node mildly prominent at 1.0 cm in short axis on image 33/2, previously 0.9 cm. There is some scattered mesenteric lymph nodes. Reproductive: Uterus not well seen.  No adnexal abnormality. Other: Small amount of free pelvic fluid. Mild presacral stranding similar to prior. Musculoskeletal: In addition to the midline ventral pelvic hernia, there is a small Spigelian herniation of omental adipose tissue on the left, image 40/2. IMPRESSION: 1. This ventral pelvic hernia containing loops of small bowel which are mildly dilated, although less so than before. Possible tethering or adhesions in this vicinity. In addition there is some swirling of mesentery extending towards this region which could reflect a low-grade volvulus although there is no pneumatosis, and the swirling was also present previously. I am skeptical of obstruction. 2. Abnormal wall thickening in the descending and especially sigmoid colon and rectum may reflect colitis. If the patient had radiation therapy in the vicinity then radiation colitis could be a cause. 3. Prior cystectomy and ileal conduit. 4. Trace ascites and presacral edema, similar to prior. 5. Elongated cystic lesion or dilated segment of the ventral pancreatic duct is stable from 11/10/2015 and warrants surveillance. Ideally, pancreatic protocol MRI with and without contrast in the next 6-12 months would be utilized for further characterization given that this finding was not present back in 2016. 6.  Aortic Atherosclerosis (ICD10-I70.0). Electronically Signed   By: Van Clines M.D.   On: 12/11/2016 19:03    Procedures Procedures (including critical care time)  Medications Ordered in ED Medications  ciprofloxacin (CIPRO) tablet 500 mg (not administered)  metroNIDAZOLE (FLAGYL) tablet 500 mg (not administered)  sodium chloride 0.9 % bolus 1,000 mL (1,000 mLs Intravenous New Bag/Given 12/11/16 1652)  iopamidol (ISOVUE-300) 61 % injection 100 mL (100 mLs  Intravenous Contrast Given 12/11/16 1829)     Initial Impression / Assessment and Plan / ED Course  I have reviewed the triage vital signs and the nursing notes.  Pertinent labs & imaging results that were available during my care  of the patient were reviewed by me and considered in my medical decision making (see chart for details).     Urine shows possible UTI. Also patient has colitis on CT scan. Patient will be placed on Cipro and Flagyl and referred to GI for next week.  Final Clinical Impressions(s) / ED Diagnoses   Final diagnoses:  Colitis    New Prescriptions New Prescriptions   CIPROFLOXACIN (CIPRO) 500 MG TABLET    One po bid   METRONIDAZOLE (FLAGYL) 500 MG TABLET    One po tid     Milton Ferguson, MD 12/11/16 2034

## 2016-12-11 NOTE — Discharge Instructions (Signed)
Drink plenty of fluids and follow-up with her family doctor or Dr.Rehman next week

## 2016-12-15 LAB — URINE CULTURE

## 2016-12-16 ENCOUNTER — Emergency Department (HOSPITAL_COMMUNITY)
Admission: EM | Admit: 2016-12-16 | Discharge: 2016-12-16 | Disposition: A | Discharge status: Home / Self Care | Payer: Medicare Other | Attending: Emergency Medicine | Admitting: Emergency Medicine

## 2016-12-16 ENCOUNTER — Encounter (HOSPITAL_COMMUNITY): Payer: Self-pay | Admitting: Emergency Medicine

## 2016-12-16 ENCOUNTER — Telehealth: Payer: Self-pay | Admitting: Emergency Medicine

## 2016-12-16 DIAGNOSIS — I10 Essential (primary) hypertension: Secondary | ICD-10-CM | POA: Insufficient documentation

## 2016-12-16 DIAGNOSIS — Z7901 Long term (current) use of anticoagulants: Secondary | ICD-10-CM | POA: Diagnosis not present

## 2016-12-16 DIAGNOSIS — Z8551 Personal history of malignant neoplasm of bladder: Secondary | ICD-10-CM | POA: Diagnosis not present

## 2016-12-16 DIAGNOSIS — N3001 Acute cystitis with hematuria: Secondary | ICD-10-CM | POA: Diagnosis not present

## 2016-12-16 DIAGNOSIS — Z79899 Other long term (current) drug therapy: Secondary | ICD-10-CM | POA: Insufficient documentation

## 2016-12-16 DIAGNOSIS — F17210 Nicotine dependence, cigarettes, uncomplicated: Secondary | ICD-10-CM | POA: Diagnosis not present

## 2016-12-16 DIAGNOSIS — N390 Urinary tract infection, site not specified: Secondary | ICD-10-CM | POA: Diagnosis present

## 2016-12-16 DIAGNOSIS — E039 Hypothyroidism, unspecified: Secondary | ICD-10-CM | POA: Insufficient documentation

## 2016-12-16 LAB — COMPREHENSIVE METABOLIC PANEL
ALK PHOS: 69 U/L (ref 38–126)
ALT: 16 U/L (ref 14–54)
AST: 18 U/L (ref 15–41)
Albumin: 3.7 g/dL (ref 3.5–5.0)
Anion gap: 9 (ref 5–15)
BUN: 10 mg/dL (ref 6–20)
CALCIUM: 9.1 mg/dL (ref 8.9–10.3)
CO2: 22 mmol/L (ref 22–32)
CREATININE: 1.1 mg/dL — AB (ref 0.44–1.00)
Chloride: 109 mmol/L (ref 101–111)
GFR calc non Af Amer: 52 mL/min — ABNORMAL LOW (ref >60)
GFR, EST AFRICAN AMERICAN: 60 mL/min — AB (ref >60)
GLUCOSE: 103 mg/dL — AB (ref 65–99)
Potassium: 3.2 mmol/L — ABNORMAL LOW (ref 3.5–5.1)
SODIUM: 140 mmol/L (ref 135–145)
Total Bilirubin: 0.4 mg/dL (ref 0.3–1.2)
Total Protein: 7.1 g/dL (ref 6.5–8.1)

## 2016-12-16 LAB — CBC WITH DIFFERENTIAL/PLATELET
Basophils Absolute: 0 10*3/uL (ref 0.0–0.1)
Basophils Relative: 0 %
EOS ABS: 0.3 10*3/uL (ref 0.0–0.7)
Eosinophils Relative: 3 %
HCT: 35.9 % — ABNORMAL LOW (ref 36.0–46.0)
HEMOGLOBIN: 12.1 g/dL (ref 12.0–15.0)
LYMPHS ABS: 1.9 10*3/uL (ref 0.7–4.0)
LYMPHS PCT: 22 %
MCH: 25.4 pg — AB (ref 26.0–34.0)
MCHC: 33.7 g/dL (ref 30.0–36.0)
MCV: 75.3 fL — ABNORMAL LOW (ref 78.0–100.0)
Monocytes Absolute: 0.5 10*3/uL (ref 0.1–1.0)
Monocytes Relative: 5 %
NEUTROS ABS: 6 10*3/uL (ref 1.7–7.7)
NEUTROS PCT: 70 %
Platelets: 304 10*3/uL (ref 150–400)
RBC: 4.77 MIL/uL (ref 3.87–5.11)
RDW: 16.6 % — ABNORMAL HIGH (ref 11.5–15.5)
WBC: 8.6 10*3/uL (ref 4.0–10.5)

## 2016-12-16 MED ORDER — SODIUM CHLORIDE 0.9 % IV SOLN
500.0000 mg | Freq: Once | INTRAVENOUS | Status: AC
  Administered 2016-12-16: 500 mg via INTRAVENOUS
  Filled 2016-12-16: qty 500

## 2016-12-16 MED ORDER — NITROFURANTOIN MONOHYD MACRO 100 MG PO CAPS: 100.0000 mg | ORAL_CAPSULE | Freq: Two times a day (BID) | ORAL | 0 refills | Status: DC

## 2016-12-16 MED ORDER — DIPHENHYDRAMINE HCL 50 MG/ML IJ SOLN
25.0000 mg | Freq: Once | INTRAMUSCULAR | Status: AC
  Administered 2016-12-16: 25 mg via INTRAVENOUS
  Filled 2016-12-16: qty 1

## 2016-12-16 NOTE — ED Provider Notes (Signed)
[facility] DEPT Provider Note   CSN: [id number] Arrival date & time: 12/16/16  1437     History   Chief Complaint Chief Complaint  Patient presents with  . Urinary Tract Infection    HPI [name] is a 66 y.o. female.  The patient is being treated for urinary tract infection and colitis with Cipro and Flagyl. The urine cultures came back and the infection is resistant to Cipro. Patient was told to come back to the emergency department for recheck.   The history is provided by the patient.  Urinary Tract Infection   This is a recurrent problem. The current episode started more than 2 days ago. The problem occurs intermittently. The problem has not changed since onset.The quality of the pain is described as aching. The pain is at a severity of 2/10. The pain is mild. There has been no fever. She is not sexually active. Pertinent negatives include no vomiting, no frequency and no hematuria.    Past Medical History:  Diagnosis Date  . Acid reflux   . Bowel obstruction (HCC) several   Recurrent SBO secondary to adhesions  . Cancer [facility]) 2011 [facility])   diagnosed in 2009 per pt. Invasive High grade Urothelial carcinoma- s/p radiation and Chemo   . Chronic back pain   . DVT (deep venous thrombosis) ([place]) 2015  . Fatty liver   . Hyperlipidemia   . Hypertension    pt says taken off medication since has lost weight.  . Hypothyroidism   . Internal hemorrhoids   . Leukopenia 12/06/2011   HIV serology negative  . Malnutrition ([place])   . PE (pulmonary embolism)    PER [facility] records  . Rectal ulcer 2016   [facility]  . UTI (lower urinary tract infection)    Recurrent    Patient Active Problem List   Diagnosis Date Noted  . Peripheral edema 05/27/2016  . Chronic rectal pain 06/02/2015  . Rectal ulcer 11/08/2014  . GERD (gastroesophageal reflux disease) 11/08/2014  . Fistula 06/27/2014  . Protein-calorie malnutrition ([place]) 06/27/2014  . Parotid mass  06/27/2014  . BV (bacterial vaginosis) 04/05/2014  . Lung nodule 04/05/2014  . Loss of weight 02/09/2014  . Grief reaction 01/28/2014  . Leg swelling 01/14/2014  . Lung infiltrate on CT 01/14/2014  . DVT (deep venous thrombosis) ([place]) 12/17/2013  . Partial small bowel obstruction ([place]) 07/21/2013  . Chronic abdominal pain 09/23/2012  . Microcytic anemia 08/26/2012  . Encounter for screening colonoscopy 05/26/2012  . Occlusion of peripherally inserted central catheter (PICC) line ([place]) 02/27/2012  . Post-menopausal bleeding 12/19/2011  . Recurrent UTI 12/05/2011  . Diarrhea 12/05/2011  . SBO (small bowel obstruction) ([place]) 10/02/2011  . Fatty liver 10/02/2011  . Elevated LFTs 09/03/2011  . Hx of bladder cancer 09/03/2011  . Bowel obstruction ([place]) 09/03/2011  . On total parenteral nutrition (TPN) 09/03/2011  . Hyperlipidemia 01/23/2007  . TOBACCO USE 08/22/2006  . Hypothyroidism 07/21/2006  . Essential hypertension 07/21/2006  . ARTHRITIS 07/21/2006    Past Surgical History:  Procedure Laterality Date  . ABDOMINAL SURGERY     with intestinal "puncture" x 2, exploratory surgery   . BACK SURGERY     X2  . BLADDER REMOVAL    . CHOLECYSTECTOMY    . CYSTECTOMY     breast  . HERNIA REPAIR     mesh  . ILEO CONDUIT     For bladder cancer  . [place]  History    Gravida Para Term Preterm AB Living             2   SAB TAB Ectopic Multiple Live Births                   Home Medications    Prior to Admission medications   Medication Sig Start Date End Date Taking? Authorizing Provider  apixaban (ELIQUIS) 5 MG TABS tablet Take 1 tablet (5 mg total) by mouth 2 (two) times daily. 06/17/16  Yes Ru[place], Modena Nunnery, MD  ciprofloxacin (CIPRO) 500 MG tablet One po bid Patient taking differently: Take 500 mg by mouth 2 (two) times daily. One po bid 12/11/16  Yes Milton Ferguson, MD  DEXILANT 60 MG capsule TAKE ONE CAPSULE BY MOUTH DAILY 12/06/16  Yes [place],  Modena Nunnery, MD  furosemide (LASIX) 40 MG tablet Take 1 tablet (40 mg total) by mouth 2 (two) times daily as needed for fluid. 06/17/16  Yes [place], Modena Nunnery, MD  hydrocortisone 2.5 % cream APPLY RECTALLY TWICE DAILY AS NEEDED 09/02/16  Yes [place], Modena Nunnery, MD  levothyroxine (SYNTHROID, LEVOTHROID) 112 MCG tablet Take 1 tablet (112 mcg total) by mouth daily. 06/17/16  Yes [place], Modena Nunnery, MD  lidocaine (XYLOCAINE) 5 % ointment APPLY AS DIRECTED AS NEEDED.PLEASE DISPENSE 3 TUBES 12/10/16  Yes Lant[place], Modena Nunnery, MD  metroNIDAZOLE (FLAGYL) 500 MG tablet One po tid Patient taking differently: Take 500 mg by mouth 3 (three) times daily. 10 day course starting on 12/12/2016 12/11/16  Yes Milton Ferguson, MD  morphine (MS CONTIN) 15 MG 12 hr tablet Take 1 tablet (15 mg total) by mouth every 12 (twelve) hours. 12/10/16  Yes [place], Modena Nunnery, MD  Multiple Vitamins-Minerals (MULTIVITAMIN PO) Take 1 tablet by mouth daily.   Yes [provider]  ondansetron (ZOFRAN-ODT) 8 MG disintegrating tablet TAKE ONE TABLET BY MOUTH EVERY 8 HOURS AS NEEDED FOR NAUSEA 05/29/16  Yes [place], Modena Nunnery, MD  oxyCODONE (OXY IR/ROXICODONE) 5 MG immediate release tablet Take 1 tablet (5 mg total) by mouth every 6 (six) hours as needed for severe pain. 12/10/16  Yes [place], Modena Nunnery, MD  potassium chloride (K-DUR,KLOR-CON) 10 MEQ tablet Take 1 tablet (10 mEq total) by mouth 3 (three) times daily. 09/30/16  Yes Pattricia Boss, MD  pravastatin (PRAVACHOL) 20 MG tablet TAKE ONE TABLET BY MOUTH DAILY FOR CHOLESTEROL 06/17/16  Yes [place], Modena Nunnery, MD  diphenoxylate-atropine (LOMOTIL) 2.5-0.025 MG tablet Take 1 tablet by mouth 3 (three) times daily as needed for diarrhea or loose stools. Patient not taking: Reported on 12/11/2016 12/10/16   Alycia Rossetti, MD  nitrofurantoin, macrocrystal-monohydrate, (MACROBID) 100 MG capsule Take 1 capsule (100 mg total) by mouth 2 (two) times daily. X 7 days 12/16/16   Milton Ferguson, MD     Family History Family History  Problem Relation Age of Onset  . Heart disease Mother        enlarged  . Hypertension Mother   . Diabetes Father   . Diabetes Sister   . Diabetes Maternal Grandmother   . Colon cancer Neg Hx     Social History Social History  Substance Use Topics  . Smoking status: Current Every Day Smoker    Packs/day: 0.10    Types: Cigarettes  . Smokeless tobacco: Never Used  . Alcohol use No     Allergies   Bactrim [sulfamethoxazole-trimethoprim]; Cefixime; Codeine; Lactose; Nitrofuran derivatives; Penicillins; and Sulfa antibiotics   Review of Systems Review  of Systems  Constitutional: Positive for fatigue. Negative for appetite change.  HENT: Negative for congestion, ear discharge and sinus pressure.   Eyes: Negative for discharge.  Respiratory: Negative for cough.   Cardiovascular: Negative for chest pain.  Gastrointestinal: Negative for abdominal pain, diarrhea and vomiting.  Genitourinary: Negative for frequency and hematuria.  Musculoskeletal: Negative for back pain.  Skin: Negative for rash.  Neurological: Negative for seizures and headaches.  Psychiatric/Behavioral: Negative for hallucinations.     Physical Exam Updated Vital Signs BP (!) 151/82   Pulse (!) 55   Temp 97.6 F (36.4 C) (Oral)   Resp 18   SpO2 99%   Physical Exam  Constitutional: She is oriented to person, place, and time. She appears well-developed.  HENT:  Head: Normocephalic.  Eyes: Conjunctivae and EOM are normal. No scleral icterus.  Neck: Neck supple. No thyromegaly present.  Cardiovascular: Normal rate and regular rhythm.  Exam reveals no gallop and no friction rub.   No murmur heard. Pulmonary/Chest: No stridor. She has no wheezes. She has no rales. She exhibits no tenderness.  Abdominal: She exhibits no distension. There is no tenderness. There is no rebound.  Patient has urostomy bag  Musculoskeletal: Normal range of motion. She exhibits no edema.   Lymphadenopathy:    She has no cervical adenopathy.  Neurological: She is oriented to person, place, and time. She exhibits normal muscle tone. Coordination normal.  Skin: No rash noted. No erythema.  Psychiatric: She has a normal mood and affect. Her behavior is normal.     ED Treatments / Results  Labs (all labs ordered are listed, but only abnormal results are displayed) Labs Reviewed  CBC WITH DIFFERENTIAL/PLATELET - Abnormal; Notable for the following:       Result Value   HCT 35.9 (*)    MCV 75.3 (*)    MCH 25.4 (*)    RDW 16.6 (*)    All other components within normal limits  COMPREHENSIVE METABOLIC PANEL - Abnormal; Notable for the following:    Potassium 3.2 (*)    Glucose, Bld 103 (*)    Creatinine, Ser 1.10 (*)    GFR calc non Af Amer 52 (*)    GFR calc Af Amer 60 (*)    All other components within normal limits    EKG  EKG Interpretation None       Radiology No results found.  Procedures Procedures (including critical care time)  Medications Ordered in ED Medications  imipenem-cilastatin (PRIMAXIN) 500 mg in sodium chloride 0.9 % 100 mL IVPB (0 mg Intravenous Stopped 12/16/16 1841)  diphenhydrAMINE (BENADRYL) injection 25 mg (25 mg Intravenous Given 12/16/16 1754)     Initial Impression / Assessment and Plan / ED Course  I have reviewed the triage vital signs and the nursing notes.  Pertinent labs & imaging results that were available during my care of the patient were reviewed by me and considered in my medical decision making (see chart for details).     Vital signs unremarkable,  white count and chemistries unremarkable. Patient was given IV antibiotics, and urinary tract infection today. And she is put on Macrobid. This may give her some itching but she takes Benadryl with it. Patient was told to follow-up with her PCP next week. Seen sooner if any problems  Final Clinical Impressions(s) / ED Diagnoses   Final diagnoses:  Acute cystitis with  hematuria    New Prescriptions New Prescriptions   NITROFURANTOIN, MACROCRYSTAL-MONOHYDRATE, (MACROBID) 100 MG CAPSULE  Take 1 capsule (100 mg total) by mouth 2 (two) times daily. X 7 days     Milton Ferguson, MD 12/16/16 [id number]

## 2016-12-16 NOTE — Progress Notes (Signed)
ED Antimicrobial Stewardship Positive Culture Follow Up   [name] is an 66 y.o. female who presented to [facility] on 12/11/2016 with a chief complaint of  Chief Complaint  Patient presents with  . Abdominal Pain    Recent Results (from the past 720 hour(s))  Urine Culture     Status: Abnormal   Collection Time: 12/11/16  8:17 PM  Result Value Ref Range Status   Specimen Description URINE, RANDOM  Final   Special Requests NONE  Final   Culture (A)  Final    >=100,000 COLONIES/mL ESCHERICHIA COLI Confirmed Extended Spectrum Beta-Lactamase Producer (ESBL) Performed at [facility], [address] [place], [place] [address]    Report Status 12/15/2016 FINAL  Final   Organism ID, Bacteria ESCHERICHIA COLI (A)  Final      Susceptibility   Escherichia coli - MIC*    AMPICILLIN >=32 RESISTANT Resistant     CEFAZOLIN >=64 RESISTANT Resistant     CEFTRIAXONE >=64 RESISTANT Resistant     CIPROFLOXACIN >=4 RESISTANT Resistant     GENTAMICIN >=16 RESISTANT Resistant     IMIPENEM <=0.25 SENSITIVE Sensitive     NITROFURANTOIN <=16 SENSITIVE Sensitive     TRIMETH/SULFA <=20 SENSITIVE Sensitive     AMPICILLIN/SULBACTAM >=32 RESISTANT Resistant     PIP/TAZO 8 SENSITIVE Sensitive     Extended ESBL POSITIVE Resistant     * >=100,000 COLONIES/mL ESCHERICHIA COLI    Treated for possible colitis with cipro and Flagyl. Will have flow manager call patient to see if feeling better. If patient reports urinary symptoms (dysuria, frequency) will need to admit patient and treat with IV abx (meropenem)  ED Provider: Harlene Ramus PA-C   Reginia Naas 12/16/2016, 9:52 AM Infectious Diseases Pharmacist Phone# [phone]

## 2016-12-16 NOTE — ED Triage Notes (Signed)
PT states she was called by a flow manager from [facility] this am and told to come to ED for possible IV antibiotics due to Urine Culture results. PT states decreased urinary output and has a urostomy bag.

## 2016-12-16 NOTE — ED Notes (Signed)
Pt ambulatory to waiting room. Pt verbalized understanding of discharge instructions.   

## 2016-12-16 NOTE — Telephone Encounter (Signed)
Post ED Visit - Positive Culture Follow-up: Successful Patient Follow-Up  Culture assessed and recommendations reviewed by: [x]  Elenor Quinones, Pharm.D. []  Heide Guile, Pharm.D., BCPS AQ-ID []  Parks Neptune, Pharm.D., BCPS []  Alycia Rossetti, Pharm.D., BCPS []  [place]y, Pharm.D., BCPS, AAHIVP []  Legrand Como, Pharm.D., BCPS, AAHIVP []  Salome Arnt, PharmD, BCPS []  Dimitri Ped, PharmD, BCPS []  Vincenza Hews, PharmD, BCPS  Positive ueinw culture  []  Patient discharged without antimicrobial prescription and treatment is now indicated []  Organism is resistant to prescribed ED discharge antimicrobial []  Patient with positive blood cultures  Changes discussed with ED provider: Harlene Ramus PA New antibiotic prescription symptom check, states with + dysuria and abdominal tenderness and dark urine, wanted to be treated for + symptoms,  Explained that if + urinary symptoms, would need to return to ED for IV antibiotics   Hazle Nordmann 12/16/2016, 12:26 PM

## 2016-12-16 NOTE — Discharge Instructions (Signed)
Start the new antibiotic tomorrow. Drink plenty of fluids and follow-up with your doctor next week as planned. Get seen sooner problems

## 2016-12-17 ENCOUNTER — Telehealth: Payer: Self-pay | Admitting: *Deleted

## 2016-12-17 NOTE — Telephone Encounter (Signed)
Received call from patient.   Requested clarification on ABTx orders from ED.   States that she was called back to ED on 12/16/2016 for IV ABTx, and given Macrobid since UC shows resistant to Cipro. Patient is currently taking Flagyl TID and Cipro BID for Colitis. With addition of Macrobid BID, patient states that there is 7 tablets of ABTx per day, and she feels that this may be too many at once.   Inquired as to if she was told to continue Cipro with addition of Macrobid on 12/16/2016., sine urine is resistant. States that ED MD did advise to complete all (3) courses.   MD please advise.

## 2016-12-17 NOTE — Telephone Encounter (Signed)
Stop the Cipro Take the flagyl and the macrobid

## 2016-12-17 NOTE — Telephone Encounter (Signed)
Call placed to patient and patient made aware.  

## 2016-12-18 ENCOUNTER — Telehealth: Payer: Self-pay | Admitting: *Deleted

## 2016-12-18 NOTE — Telephone Encounter (Signed)
Received call from patient.   States that she continues to have loose watery stools. Advised that Flagyl has been prescribed for bowels and should clear the infection. Advised to complete the 10 day course.

## 2016-12-23 ENCOUNTER — Ambulatory Visit (INDEPENDENT_AMBULATORY_CARE_PROVIDER_SITE_OTHER): Payer: Medicare Other | Admitting: Family Medicine

## 2016-12-23 ENCOUNTER — Encounter: Payer: Self-pay | Admitting: Family Medicine

## 2016-12-23 VITALS — BP 138/76 | HR 86 | Temp 97.9°F | Resp 14 | Ht 65.5 in | Wt 188.0 lb

## 2016-12-23 DIAGNOSIS — K529 Noninfective gastroenteritis and colitis, unspecified: Secondary | ICD-10-CM | POA: Diagnosis not present

## 2016-12-23 DIAGNOSIS — Z8551 Personal history of malignant neoplasm of bladder: Secondary | ICD-10-CM

## 2016-12-23 DIAGNOSIS — E876 Hypokalemia: Secondary | ICD-10-CM | POA: Diagnosis not present

## 2016-12-23 DIAGNOSIS — N390 Urinary tract infection, site not specified: Secondary | ICD-10-CM | POA: Diagnosis not present

## 2016-12-23 DIAGNOSIS — Z936 Other artificial openings of urinary tract status: Secondary | ICD-10-CM | POA: Diagnosis not present

## 2016-12-23 MED ORDER — POTASSIUM CHLORIDE CRYS ER 20 MEQ PO TBCR: 20.0000 meq | EXTENDED_RELEASE_TABLET | Freq: Every day | ORAL | 2 refills | Status: DC

## 2016-12-23 NOTE — Assessment & Plan Note (Signed)
She's been treated on and off pending  Her systemic symptoms for urinary tract infection. She is definitely colonized that she's had a urostomy for years and always has some type of bacteria grow on her cultures. I think we get her established with a local urologist they can help oversee her symptoms and her concerns with her urostomy and change in her urine I think that she does get some blood into the urine from the stump at times causing the urine to appear darker have also encouraged her to keep herself hydrated which she states she has been doing.

## 2016-12-23 NOTE — Progress Notes (Signed)
Subjective:    Patient ID: [name], female    DOB: Sep 30, 1950, 66 y.o.   MRN: [id number]  Patient presents for ER F/U  Here for follow-up. She was seen in the ER after our visit her abdominal pain worsened. She is a CT scan done after the x-ray showed constipation. CT scan however show abnormal wall thickening in the descending and sigmoid colon as well as rectum concerning for [place] as though she's has thickening before and has had radiation treatment. Also showed trace ascites and presacral edema which was unchanged from previous scans. She also had a cystic lesion near her pancreatic duct which is been stable since May 2017 recommend another follow up in 6-12 months with MRI. She was prescribed Flagyl Cipro initially she then went back to the emergency room about 5 days later because of darkness of her urine and her urostomy she also felt weak she was then prescribed Macrobid however was concerned about taking all of them together. At dietary stop the Cipro and to continue the Flagyl and the Macrobid which she has 1 more day of Flagyl .  She has urostomy as result of previous bladder cancer, in 2009, she had multiple complications including bowel perforation, multiple SBO over the years, she had small bowel resection in Nov 2015, was on TPN for 3 years, and had salvage chemoradiation to pelvis   Review Of Systems:  GEN- denies fatigue, fever, weight loss,weakness, recent illness HEENT- denies eye drainage, change in vision, nasal discharge, CVS- denies chest pain, palpitations RESP- denies SOB, cough, wheeze ABD- denies N/V, change in stools, abd pain GU- denies dysuria, hematuria, dribbling, incontinence MSK- denies joint pain, muscle aches, injury Neuro- denies headache, dizziness, syncope, seizure activity       Objective:    BP 138/76   Pulse 86   Temp 97.9 F (36.6 C) (Oral)   Resp 14   Ht 5' 5.5" (1.664 m)   Wt 188 lb (85.3 kg)   SpO2 98%   BMI 30.81 kg/m   GEN- NAD, alert and oriented x3 HEENT- PERRL, EOMI, non injected sclera, pink conjunctiva, MMM, oropharynx clear Neck- Supple, no thyromegaly CVS- RRR, no murmur RESP-CTAB ABD-NABS,soft,NT,ND,Urostomy in tact   Subjective:    Patient ID: [name], female    DOB: 03-26-1951, 66 y.o.   MRN: [id number]  Patient presents for ER F/U   Has multiple bowel movements a day, and very gassy 10-20 a day.His history of chronic diarrhea has been on multiple medications she will often alternate between constipation and diarrhea found him much pain medication she is using. The Imodium has not helped this time. She has not been on any recent antibiotics. She denies any nausea vomiting no abdominal pain no fever. She admits that she has been eating a lot of fried foods breads things that she is had not been eating large quantities of's says she is went to [place] and then when she returned home she has had some family gatherings.  Hemorroid pain and chronic Pain. She is out of her lidocaine ointment needs this refilled. She's also had some swelling in her ankles which is also chronic and intermittent. The right side swells more than the left. She does take the Lasix as needed.  Due for repeat TFT   Review Of Systems:  GEN- denies fatigue, fever, weight loss,weakness, recent illness HEENT- denies eye drainage, change in vision, nasal discharge, CVS- denies chest pain, palpitations RESP- denies SOB, cough, wheeze ABD- denies  N/V, +change in stools, abd pain GU- denies dysuria, hematuria, dribbling, incontinence MSK- denies joint pain, muscle aches, injury Neuro- denies headache, dizziness, syncope, seizure activity       Objective:    BP 138/76   Pulse 86   Temp 97.9 F (36.6 C) (Oral)   Resp 14   Ht 5' 5.5" (1.664 m)   Wt 188 lb (85.3 kg)   SpO2 98%   BMI 30.81 kg/m  GEN- NAD, alert and oriented x3 HEENT- PERRL, EOMI, non injected sclera, pink conjunctiva, MMM, oropharynx  clear CVS- RRR, no murmur RESP-CTAB ABD Hypoactive bowel sounds , soft.ND. Urostomy in tact, urine clear  Ext- Pedal edema- R>L Pulses- Radial, DP- 2+        Assessment & Plan:      Problem List Items Addressed This Visit    Hx of bladder cancer   Recurrent UTI    She's been treated on and off pending  Her systemic symptoms for urinary tract infection. She is definitely colonized that she's had a urostomy for years and always has some type of bacteria grow on her cultures. I think we get her established with a local urologist they can help oversee her symptoms and her concerns with her urostomy and change in her urine I think that she does get some blood into the urine from the stump at times causing the urine to appear darker have also encouraged her to keep herself hydrated which she states she has been doing.       Other Visit Diagnoses    Hypokalemia    -  Primary   restart potassium 18meq daily   Colitis       Chroic abdominal pain, diarrhea/constipation episodes for many years, has had treatment for both. completed flagyl, though her symptoms actually improved once She reduced her diet. She has been on TPN for many years when she started eating again about a year and half ago she's had recurrent episodes of pain again she always overdoes it with her digestive system. Only reduce her down to more baby food consistency and clear liquids her pain typically resolves itself. She is on chronic pain medication when she does have flares. We'll get her set up to see a local gastroenterologist to weigh in on the recent CT scan and if any further workup is needed. Of note she is also chronic rectal pain she's had multiple imaging studies had rectal scopes done no cause can be found for her rectal pain. She is seen both local gastroenterologist as well as rectal surgeons at [facility]      Note: This dictation was prepared with Dragon dictation along with smaller phrase technology. Any  transcriptional errors that result from this process are unintentional.    EXT- No edema Pulses- Radial, DP- 2+        Assessment & Plan:      Problem List Items Addressed This Visit    Hx of bladder cancer   Recurrent UTI    She's been treated on and off pending  Her systemic symptoms for urinary tract infection. She is definitely colonized that she's had a urostomy for years and always has some type of bacteria grow on her cultures. I think we get her established with a local urologist they can help oversee her symptoms and her concerns with her urostomy and change in her urine I think that she does get some blood into the urine from the stump at times causing the urine  to appear darker have also encouraged her to keep herself hydrated which she states she has been doing.       Other Visit Diagnoses    Hypokalemia    -  Primary   restart potassium 46meq daily   Colitis       Chroic abdominal pain, diarrhea/constipation episodes for many years, has had treatment for both. completed flagyl, though her symptoms actually improved once s      Note: This dictation was prepared with Dragon dictation along with smaller phrase technology. Any transcriptional errors that result from this process are unintentional.

## 2016-12-23 NOTE — Patient Instructions (Addendum)
Referral to urology Referral to GI ( for your colon thickening) Restart the potassium pills  F/U 3 months

## 2016-12-26 DIAGNOSIS — Z936 Other artificial openings of urinary tract status: Secondary | ICD-10-CM | POA: Diagnosis not present

## 2016-12-26 DIAGNOSIS — C679 Malignant neoplasm of bladder, unspecified: Secondary | ICD-10-CM | POA: Diagnosis not present

## 2017-01-13 ENCOUNTER — Telehealth: Payer: Self-pay | Admitting: *Deleted

## 2017-01-13 MED ORDER — MORPHINE SULFATE ER 15 MG PO TBCR: 15.0000 mg | EXTENDED_RELEASE_TABLET | Freq: Two times a day (BID) | ORAL | 0 refills | Status: DC

## 2017-01-13 MED ORDER — OXYCODONE HCL 5 MG PO TABS: 5.0000 mg | ORAL_TABLET | Freq: Four times a day (QID) | ORAL | 0 refills | Status: DC | PRN

## 2017-01-13 NOTE — Telephone Encounter (Signed)
Okay to refill? 

## 2017-01-13 NOTE — Telephone Encounter (Signed)
Received call from patient.   Requested refill on Oxycodone and MS Contin.   Ok to refill??  Last office visit 12/23/2016.  Last refill 12/10/2016 on both.

## 2017-01-13 NOTE — Telephone Encounter (Signed)
Prescription printed and patient made aware to come to office to pick up on 01/14/2017 per VM.

## 2017-02-10 DIAGNOSIS — Z936 Other artificial openings of urinary tract status: Secondary | ICD-10-CM | POA: Diagnosis not present

## 2017-02-10 DIAGNOSIS — C679 Malignant neoplasm of bladder, unspecified: Secondary | ICD-10-CM | POA: Diagnosis not present

## 2017-02-18 ENCOUNTER — Telehealth: Payer: Self-pay | Admitting: *Deleted

## 2017-02-18 DIAGNOSIS — N281 Cyst of kidney, acquired: Secondary | ICD-10-CM | POA: Diagnosis not present

## 2017-02-18 DIAGNOSIS — Z8551 Personal history of malignant neoplasm of bladder: Secondary | ICD-10-CM | POA: Diagnosis not present

## 2017-02-18 DIAGNOSIS — Z936 Other artificial openings of urinary tract status: Secondary | ICD-10-CM | POA: Diagnosis not present

## 2017-02-18 NOTE — Telephone Encounter (Signed)
Okay to refill? 

## 2017-02-18 NOTE — Telephone Encounter (Signed)
Received call from patient.   Requested refill on MS Contin and Oxy IR.   Ok to refill??  Last office visit 12/23/2016.  Last refill 01/13/2017 on both.

## 2017-02-19 MED ORDER — OXYCODONE HCL 5 MG PO TABS: 5.0000 mg | ORAL_TABLET | Freq: Four times a day (QID) | ORAL | 0 refills | Status: DC | PRN

## 2017-02-19 MED ORDER — MORPHINE SULFATE ER 15 MG PO TBCR: 15.0000 mg | EXTENDED_RELEASE_TABLET | Freq: Two times a day (BID) | ORAL | 0 refills | Status: DC

## 2017-02-19 NOTE — Telephone Encounter (Signed)
Prescription printed and patient made aware to come to office to pick up after 2pm on 02/19/2017.

## 2017-03-13 ENCOUNTER — Other Ambulatory Visit: Payer: Self-pay | Admitting: Family Medicine

## 2017-03-26 ENCOUNTER — Telehealth: Payer: Self-pay | Admitting: Family Medicine

## 2017-03-26 MED ORDER — OXYCODONE HCL 5 MG PO TABS: 5.0000 mg | ORAL_TABLET | Freq: Four times a day (QID) | ORAL | 0 refills | Status: DC | PRN

## 2017-03-26 MED ORDER — MORPHINE SULFATE ER 15 MG PO TBCR: 15.0000 mg | EXTENDED_RELEASE_TABLET | Freq: Two times a day (BID) | ORAL | 0 refills | Status: DC

## 2017-03-26 NOTE — Telephone Encounter (Signed)
Prescription printed and patient made aware to come to office to pick up on 03/27/2017.

## 2017-03-26 NOTE — Telephone Encounter (Signed)
Okay to refill? 

## 2017-03-26 NOTE — Telephone Encounter (Signed)
Ok to refill both??  Last office visit 12/23/2016.  Last refill 02/19/2017.

## 2017-03-26 NOTE — Telephone Encounter (Signed)
Pt needs refill on morphine and oxycodone, she is completely out.

## 2017-04-04 ENCOUNTER — Other Ambulatory Visit: Payer: Self-pay | Admitting: Family Medicine

## 2017-04-10 ENCOUNTER — Ambulatory Visit: Payer: Medicare Other

## 2017-04-10 ENCOUNTER — Other Ambulatory Visit: Payer: Self-pay | Admitting: *Deleted

## 2017-04-10 DIAGNOSIS — C679 Malignant neoplasm of bladder, unspecified: Secondary | ICD-10-CM | POA: Diagnosis not present

## 2017-04-10 DIAGNOSIS — Z936 Other artificial openings of urinary tract status: Secondary | ICD-10-CM | POA: Diagnosis not present

## 2017-04-10 MED ORDER — TETANUS-DIPHTH-ACELL PERTUSSIS 5-2-15.5 LF-MCG/0.5 IM SUSP: 0.5000 mL | Freq: Once | INTRAMUSCULAR | 0 refills | Status: AC

## 2017-04-26 ENCOUNTER — Other Ambulatory Visit: Payer: Self-pay | Admitting: Family Medicine

## 2017-04-28 ENCOUNTER — Telehealth: Payer: Self-pay | Admitting: *Deleted

## 2017-04-28 NOTE — Telephone Encounter (Signed)
Received call from patient.   Requested refill on Oxycodone and MS Contin.   Ok to refill??  Last office visit 0625/2018.  Last refill 03/26/2017.

## 2017-04-28 NOTE — Telephone Encounter (Signed)
Okay to refill? 

## 2017-04-29 MED ORDER — MORPHINE SULFATE ER 15 MG PO TBCR: 15.0000 mg | EXTENDED_RELEASE_TABLET | Freq: Two times a day (BID) | ORAL | 0 refills | Status: DC

## 2017-04-29 MED ORDER — OXYCODONE HCL 5 MG PO TABS: 5.0000 mg | ORAL_TABLET | Freq: Four times a day (QID) | ORAL | 0 refills | Status: DC | PRN

## 2017-04-29 NOTE — Telephone Encounter (Signed)
Prescription printed and patient made aware to come to office to pick up after 2pm on 04/29/2017.

## 2017-05-09 DIAGNOSIS — C679 Malignant neoplasm of bladder, unspecified: Secondary | ICD-10-CM | POA: Diagnosis not present

## 2017-05-09 DIAGNOSIS — Z936 Other artificial openings of urinary tract status: Secondary | ICD-10-CM | POA: Diagnosis not present

## 2017-05-28 ENCOUNTER — Telehealth: Payer: Self-pay | Admitting: *Deleted

## 2017-05-28 DIAGNOSIS — N390 Urinary tract infection, site not specified: Secondary | ICD-10-CM | POA: Diagnosis not present

## 2017-05-28 MED ORDER — OXYCODONE HCL 5 MG PO TABS
5.0000 mg | ORAL_TABLET | Freq: Four times a day (QID) | ORAL | 0 refills | Status: DC | PRN
Start: 2017-05-28 — End: 2017-06-17

## 2017-05-28 MED ORDER — MORPHINE SULFATE ER 15 MG PO TBCR: 15.0000 mg | EXTENDED_RELEASE_TABLET | Freq: Two times a day (BID) | ORAL | 0 refills | Status: DC

## 2017-05-28 NOTE — Telephone Encounter (Signed)
Okay to refill, due for OV

## 2017-05-28 NOTE — Telephone Encounter (Signed)
Received call from patient.   Requested refill on MS Contin and Oxycodone.   Ok to refill??  Last office visit 12/23/2016.  Last refill 04/29/2017 on both.

## 2017-05-28 NOTE — Telephone Encounter (Signed)
Prescription printed and patient made aware to come to office to pick up per VM.  

## 2017-06-03 ENCOUNTER — Ambulatory Visit (INDEPENDENT_AMBULATORY_CARE_PROVIDER_SITE_OTHER): Payer: Medicare Other | Admitting: Family Medicine

## 2017-06-03 ENCOUNTER — Other Ambulatory Visit: Payer: Self-pay

## 2017-06-03 ENCOUNTER — Encounter: Payer: Self-pay | Admitting: Family Medicine

## 2017-06-03 VITALS — BP 132/78 | HR 72 | Temp 98.0°F | Resp 14 | Ht 65.5 in | Wt 178.0 lb

## 2017-06-03 DIAGNOSIS — G8929 Other chronic pain: Secondary | ICD-10-CM

## 2017-06-03 DIAGNOSIS — E038 Other specified hypothyroidism: Secondary | ICD-10-CM | POA: Diagnosis not present

## 2017-06-03 DIAGNOSIS — N390 Urinary tract infection, site not specified: Secondary | ICD-10-CM

## 2017-06-03 DIAGNOSIS — K6289 Other specified diseases of anus and rectum: Secondary | ICD-10-CM

## 2017-06-03 DIAGNOSIS — I10 Essential (primary) hypertension: Secondary | ICD-10-CM

## 2017-06-03 DIAGNOSIS — E7800 Pure hypercholesterolemia, unspecified: Secondary | ICD-10-CM

## 2017-06-03 LAB — URINALYSIS, ROUTINE W REFLEX MICROSCOPIC
Bilirubin Urine: NEGATIVE
Glucose, UA: NEGATIVE
HYALINE CAST: NONE SEEN /LPF
KETONES UR: NEGATIVE
LEUKOCYTES UA: NEGATIVE
Nitrite: NEGATIVE
SPECIFIC GRAVITY, URINE: 1.025 (ref 1.001–1.03)
pH: 6 (ref 5.0–8.0)

## 2017-06-03 LAB — MICROSCOPIC MESSAGE

## 2017-06-03 NOTE — Assessment & Plan Note (Signed)
Controlled, no changes to medications  Hypothyroidism- check TFT may be contributing to fatigue, but also MTF in setting of current infection, caring for new grandchild  UTI based on culture sensitive to macrobid, repeated today as multipke species isolated, she has had E coli and Klebsiella with some colonization with her urostomy   Chronic abd pain- maintained on chronic narcotics

## 2017-06-03 NOTE — Progress Notes (Signed)
   Subjective:    Patient ID: [name], female    DOB: 04/24/1951, 66 y.o.   MRN: [id number]  Patient presents for UTI (patient has been seen at [facility] and given ABTx- was changed when C/S resulted- currently on Macrobid)  Seen at [facility] in [place] last wed, had odor to urine, fatigue, no fever. Went to UC due to history of recurrent UTI despite her urostomy. Prescribed Cipro, she took for 5 days, then was called and told culture was not sensitive was given macrobid yesterday.Continues to have fatigue.  No abd pain, bowels are okay  Urine culture E coli, Klebisella , proteus, resisant to Cipro   Hypothyroidism, TSH 5.8 in June   qUIT SMOKING a few months ago after friend died with lung cancer  Helping to care for grandchild daily which may also be causing some fatigue   Review Of Systems:  GEN- + fatigue, denies fever, weight loss,weakness, recent illness HEENT- denies eye drainage, change in vision, nasal discharge, CVS- denies chest pain, palpitations RESP- denies SOB, cough, wheeze ABD- denies N/V, change in stools, abd pain GU- denies dysuria, hematuria, dribbling, incontinence MSK- denies joint pain, muscle aches, injury Neuro- denies headache, dizziness, syncope, seizure activity       Objective:    BP 132/78   Pulse 72   Temp 98 F (36.7 C) (Oral)   Resp 14   Ht 5' 5.5" (1.664 m)   Wt 178 lb (80.7 kg)   SpO2 98%   BMI 29.17 kg/m  GEN- NAD, alert and oriented x3 HEENT- PERRL, EOMI, non injected sclera, pink conjunctiva, MMM, oropharynx clear Neck- Supple, no thyromegaly CVS- RRR, no murmur RESP-CTAB ABD-NABS,soft,NT,ND, urostomy in tact, clear appearing EXT- No edema Pulses- Radial 2+        Assessment & Plan:      Problem List Items Addressed This Visit      Unprioritized   Hyperlipidemia   Relevant Orders   Lipid panel   Hypothyroidism - Primary   Relevant Orders   T3, free   TSH   T4, free   Recurrent UTI   Relevant Medications   nitrofurantoin, macrocrystal-monohydrate, (MACROBID) 100 MG capsule   Other Relevant Orders   Urinalysis, Routine w reflex microscopic (Completed)   Urine Culture   Essential hypertension    Controlled, no changes to medications  Hypothyroidism- check TFT may be contributing to fatigue, but also MTF in setting of current infection, caring for new grandchild  UTI based on culture sensitive to macrobid, repeated today as multipke species isolated, she has had E coli and Klebsiella with some colonization with her urostomy   Chronic abd pain- maintained on chronic narcotics       Relevant Orders   Comprehensive metabolic panel   CBC with Differential/Platelet      Note: This dictation was prepared with Dragon dictation along with smaller phrase technology. Any transcriptional errors that result from this process are unintentional.

## 2017-06-03 NOTE — Patient Instructions (Signed)
F/U 4 months PHYSICAL  

## 2017-06-04 LAB — CBC WITH DIFFERENTIAL/PLATELET
BASOS PCT: 0.6 %
Basophils Absolute: 47 cells/uL (ref 0–200)
EOS PCT: 2.8 %
Eosinophils Absolute: 221 cells/uL (ref 15–500)
HEMATOCRIT: 34.9 % — AB (ref 35.0–45.0)
HEMOGLOBIN: 11.5 g/dL — AB (ref 11.7–15.5)
LYMPHS ABS: 1541 {cells}/uL (ref 850–3900)
MCH: 25.9 pg — ABNORMAL LOW (ref 27.0–33.0)
MCHC: 33 g/dL (ref 32.0–36.0)
MCV: 78.6 fL — ABNORMAL LOW (ref 80.0–100.0)
MPV: 10.5 fL (ref 7.5–12.5)
Monocytes Relative: 7.2 %
NEUTROS ABS: 5522 {cells}/uL (ref 1500–7800)
Neutrophils Relative %: 69.9 %
Platelets: 364 10*3/uL (ref 140–400)
RBC: 4.44 10*6/uL (ref 3.80–5.10)
RDW: 14.9 % (ref 11.0–15.0)
Total Lymphocyte: 19.5 %
WBC mixed population: 569 cells/uL (ref 200–950)
WBC: 7.9 10*3/uL (ref 3.8–10.8)

## 2017-06-04 LAB — COMPREHENSIVE METABOLIC PANEL
AG Ratio: 1.1 (calc) (ref 1.0–2.5)
ALKALINE PHOSPHATASE (APISO): 80 U/L (ref 33–130)
ALT: 16 U/L (ref 6–29)
AST: 16 U/L (ref 10–35)
Albumin: 3.6 g/dL (ref 3.6–5.1)
BILIRUBIN TOTAL: 0.5 mg/dL (ref 0.2–1.2)
BUN/Creatinine Ratio: 12 (calc) (ref 6–22)
BUN: 13 mg/dL (ref 7–25)
CO2: 24 mmol/L (ref 20–32)
Calcium: 8 mg/dL — ABNORMAL LOW (ref 8.6–10.4)
Chloride: 105 mmol/L (ref 98–110)
Creat: 1.09 mg/dL — ABNORMAL HIGH (ref 0.50–0.99)
Globulin: 3.2 g/dL (calc) (ref 1.9–3.7)
Glucose, Bld: 89 mg/dL (ref 65–99)
Potassium: 3.6 mmol/L (ref 3.5–5.3)
Sodium: 138 mmol/L (ref 135–146)
TOTAL PROTEIN: 6.8 g/dL (ref 6.1–8.1)

## 2017-06-04 LAB — LIPID PANEL
CHOL/HDL RATIO: 2.7 (calc) (ref <5.0)
Cholesterol: 120 mg/dL (ref <200)
HDL: 45 mg/dL — AB (ref >50)
LDL Cholesterol (Calc): 51 mg/dL (calc)
NON-HDL CHOLESTEROL (CALC): 75 mg/dL (ref <130)
TRIGLYCERIDES: 166 mg/dL — AB (ref <150)

## 2017-06-04 LAB — TSH: TSH: 15.97 mIU/L — ABNORMAL HIGH (ref 0.40–4.50)

## 2017-06-04 LAB — T4, FREE: Free T4: 1 ng/dL (ref 0.8–1.8)

## 2017-06-04 LAB — T3, FREE: T3, Free: 2.1 pg/mL — ABNORMAL LOW (ref 2.3–4.2)

## 2017-06-05 ENCOUNTER — Other Ambulatory Visit: Payer: Self-pay | Admitting: *Deleted

## 2017-06-05 LAB — URINE CULTURE
MICRO NUMBER:: 81363608
SPECIMEN QUALITY: ADEQUATE

## 2017-06-05 MED ORDER — LEVOTHYROXINE SODIUM 125 MCG PO TABS: 125.0000 ug | ORAL_TABLET | Freq: Every day | ORAL | 3 refills | Status: DC

## 2017-06-17 ENCOUNTER — Encounter: Payer: Self-pay | Admitting: Family Medicine

## 2017-06-17 ENCOUNTER — Ambulatory Visit (INDEPENDENT_AMBULATORY_CARE_PROVIDER_SITE_OTHER): Payer: Medicare Other | Admitting: Family Medicine

## 2017-06-17 ENCOUNTER — Other Ambulatory Visit: Payer: Self-pay

## 2017-06-17 VITALS — BP 130/68 | HR 74 | Temp 97.7°F | Resp 14 | Ht 65.5 in | Wt 189.0 lb

## 2017-06-17 DIAGNOSIS — K6289 Other specified diseases of anus and rectum: Secondary | ICD-10-CM | POA: Diagnosis not present

## 2017-06-17 DIAGNOSIS — G8929 Other chronic pain: Secondary | ICD-10-CM

## 2017-06-17 DIAGNOSIS — M546 Pain in thoracic spine: Secondary | ICD-10-CM | POA: Diagnosis not present

## 2017-06-17 DIAGNOSIS — R109 Unspecified abdominal pain: Secondary | ICD-10-CM | POA: Diagnosis not present

## 2017-06-17 DIAGNOSIS — M542 Cervicalgia: Secondary | ICD-10-CM

## 2017-06-17 MED ORDER — OXYCODONE HCL 5 MG PO TABS: 5.0000 mg | ORAL_TABLET | Freq: Four times a day (QID) | ORAL | 0 refills | Status: DC | PRN

## 2017-06-17 MED ORDER — DEXLANSOPRAZOLE 60 MG PO CPDR: 1.0000 | DELAYED_RELEASE_CAPSULE | Freq: Every day | ORAL | 1 refills | Status: DC

## 2017-06-17 MED ORDER — MORPHINE SULFATE ER 15 MG PO TBCR
15.0000 mg | EXTENDED_RELEASE_TABLET | Freq: Two times a day (BID) | ORAL | 0 refills | Status: DC
Start: 2017-06-17 — End: 2017-07-23

## 2017-06-17 MED ORDER — TIZANIDINE HCL 4 MG PO TABS: 4.0000 mg | ORAL_TABLET | Freq: Four times a day (QID) | ORAL | 0 refills | Status: DC | PRN

## 2017-06-17 NOTE — Patient Instructions (Addendum)
Decrease clear liquds for 2 days, then go back baby food  Use pain medication as needed  Epson salt bath  F/U 3 months

## 2017-06-17 NOTE — Progress Notes (Signed)
   Subjective:    Patient ID: [name], female    DOB: [birth year], 66 y.o.   MRN: [id number]  Patient presents for Muscle Pain (x5 days- fell on snow- was not "hard" fall- states that she woke with neck pain that has improved and pain in R side )   Last week, went to mailbox and fell on her side, she remembers rolling over trying to get over. No lacerations or cuts, no bruising.  The next day, she began having right sided neck pain, used heating pad and that resolved, and now has soreness in RUQ and Righ mid back, used heating pad on this and it improved. Some days but more rectal pain rectal, using her previous creams, had some looser stools recently like her typical, passing gas as normally  Needs refill for next week on Pain medication   Review Of Systems:  GEN- denies fatigue, fever, weight loss,weakness, recent illness HEENT- denies eye drainage, change in vision, nasal discharge, CVS- denies chest pain, palpitations RESP- denies SOB, cough, wheeze ABD- denies N/V, +change in stools, abd pain GU- denies dysuria, hematuria, dribbling, incontinence MSK- denies joint pain,+ muscle aches, injury Neuro- denies headache, dizziness, syncope, seizure activity       Objective:    BP 130/68   Pulse 74   Temp 97.7 F (36.5 C) (Oral)   Resp 14   Ht 5' 5.5" (1.664 m)   Wt 189 lb (85.7 kg)   SpO2 98%   BMI 30.97 kg/m  GEN- NAD, alert and oriented x3 HEENT- PERRL, EOMI, non injected sclera, pink conjunctiva, MMM, oropharynx clear Neck- Supple, good ROM, no spasm noted  CVS- RRR, no murmur RESP-CTAB ABD-NABS,soft,NT,ND, urostomy in tact Chest wall- mild TTP Right lower ribs and side wall, no step off no bruising ,mild TTP thoraicc parapsinals right side, spine NT EXT- No edema Pulses- Radial, 2+        Assessment & Plan:      Problem List Items Addressed This Visit      Unprioritized   Chronic rectal pain   Chronic abdominal pain - Primary    Chronic rectal  pain and abd pain, she has history of SBO but has good bowel sounds, benign abdominal exam today  She often overeats and things do not digest well leading to abd pain Recommend going to clear liquids and then progressing to soft foods  Continue creams/suppository for rectal pain   For neck pain this has pretty much resolved, has some right sided pain, suspect MSK pain from fall as sore a few days later. No respiratory compromise, hold on imaging and see how she does epson salt soaks in bath tub will help all above      Relevant Medications   tiZANidine (ZANAFLEX) 4 MG tablet   oxyCODONE (OXY IR/ROXICODONE) 5 MG immediate release tablet   morphine (MS CONTIN) 15 MG 12 hr tablet    Other Visit Diagnoses    Neck pain       Acute right-sided thoracic back pain       Relevant Medications   tiZANidine (ZANAFLEX) 4 MG tablet   oxyCODONE (OXY IR/ROXICODONE) 5 MG immediate release tablet   morphine (MS CONTIN) 15 MG 12 hr tablet      Note: This dictation was prepared with Dragon dictation along with smaller phrase technology. Any transcriptional errors that result from this process are unintentional.

## 2017-06-18 ENCOUNTER — Encounter: Payer: Self-pay | Admitting: Family Medicine

## 2017-06-18 DIAGNOSIS — C679 Malignant neoplasm of bladder, unspecified: Secondary | ICD-10-CM | POA: Diagnosis not present

## 2017-06-18 DIAGNOSIS — Z936 Other artificial openings of urinary tract status: Secondary | ICD-10-CM | POA: Diagnosis not present

## 2017-06-18 NOTE — Assessment & Plan Note (Addendum)
Chronic rectal pain and abd pain, she has history of SBO but has good bowel sounds, benign abdominal exam today  She often overeats and things do not digest well leading to abd pain Recommend going to clear liquids and then progressing to soft foods  Continue creams/suppository for rectal pain   For neck pain this has pretty much resolved, has some right sided pain, suspect MSK pain from fall as sore a few days later. No respiratory compromise, hold on imaging and see how she does epson salt soaks in bath tub will help all above

## 2017-06-27 ENCOUNTER — Other Ambulatory Visit: Payer: Self-pay | Admitting: Family Medicine

## 2017-07-12 ENCOUNTER — Other Ambulatory Visit: Payer: Self-pay | Admitting: Family Medicine

## 2017-07-15 ENCOUNTER — Encounter: Payer: Self-pay | Admitting: Family Medicine

## 2017-07-15 ENCOUNTER — Ambulatory Visit: Payer: Medicare Other | Admitting: Family Medicine

## 2017-07-15 ENCOUNTER — Other Ambulatory Visit: Payer: Self-pay

## 2017-07-15 VITALS — BP 126/68 | HR 86 | Temp 98.1°F | Resp 14 | Ht 65.5 in | Wt 184.0 lb

## 2017-07-15 DIAGNOSIS — M6740 Ganglion, unspecified site: Secondary | ICD-10-CM | POA: Diagnosis not present

## 2017-07-15 DIAGNOSIS — R197 Diarrhea, unspecified: Secondary | ICD-10-CM

## 2017-07-15 LAB — BASIC METABOLIC PANEL
BUN/Creatinine Ratio: 12 (calc) (ref 6–22)
BUN: 12 mg/dL (ref 7–25)
CHLORIDE: 108 mmol/L (ref 98–110)
CO2: 24 mmol/L (ref 20–32)
Calcium: 7.9 mg/dL — ABNORMAL LOW (ref 8.6–10.4)
Creat: 1.03 mg/dL — ABNORMAL HIGH (ref 0.50–0.99)
Glucose, Bld: 95 mg/dL (ref 65–99)
POTASSIUM: 3.5 mmol/L (ref 3.5–5.3)
Sodium: 141 mmol/L (ref 135–146)

## 2017-07-15 LAB — CBC WITH DIFFERENTIAL/PLATELET
BASOS ABS: 18 {cells}/uL (ref 0–200)
Basophils Relative: 0.3 %
EOS ABS: 150 {cells}/uL (ref 15–500)
Eosinophils Relative: 2.5 %
HEMATOCRIT: 34.9 % — AB (ref 35.0–45.0)
Hemoglobin: 11.4 g/dL — ABNORMAL LOW (ref 11.7–15.5)
LYMPHS ABS: 1344 {cells}/uL (ref 850–3900)
MCH: 25.4 pg — AB (ref 27.0–33.0)
MCHC: 32.7 g/dL (ref 32.0–36.0)
MCV: 77.9 fL — AB (ref 80.0–100.0)
MPV: 10.6 fL (ref 7.5–12.5)
Monocytes Relative: 6.7 %
NEUTROS PCT: 68.1 %
Neutro Abs: 4086 cells/uL (ref 1500–7800)
PLATELETS: 310 10*3/uL (ref 140–400)
RBC: 4.48 10*6/uL (ref 3.80–5.10)
RDW: 15.3 % — AB (ref 11.0–15.0)
TOTAL LYMPHOCYTE: 22.4 %
WBC mixed population: 402 cells/uL (ref 200–950)
WBC: 6 10*3/uL (ref 3.8–10.8)

## 2017-07-15 NOTE — Progress Notes (Signed)
   Subjective:    Patient ID: [name], female    DOB: 03-22-1951, 67 y.o.   MRN: [id number]  Patient presents for Diarrhea (x6 days- loose stools from Tuesday- Sunday- increased pain with hemorrhoids) and Knot to L Foot (noted knot to foot/ lower leg- voiced c/o edema and pain)   Had diarrhea for almost  1 week, has had diarrhea/ constipation issues in past as well. Took immodium, liqid and pills, was not measuring just drinking, pepto bismol Then had  Denies any change in her diet before it happed Yesterday ate noodles/ and soup and no diarrhea  She took 2 oxycodone and was improved Had 2 small bowel movements yesterday and none today  No known sick contacts No vomiting Works in housekeeping [place]    Left foot, had small know for 2 months but then last week, became larged, started  Using OTC Topical NSAID nad swelling has gone down. No injury to foot, no pain with walking   Weight down 5lbs   Review Of Systems:  GEN- denies fatigue, fever, weight loss,weakness, recent illness HEENT- denies eye drainage, change in vision, nasal discharge, CVS- denies chest pain, palpitations RESP- denies SOB, cough, wheeze ABD- denies N/V,+ change in stools, abd pain GU- denies dysuria, hematuria, dribbling, incontinence MSK- denies joint pain, muscle aches, injury Neuro- denies headache, dizziness, syncope, seizure activity       Objective:    BP 126/68   Pulse 86   Temp 98.1 F (36.7 C) (Oral)   Resp 14   Ht 5' 5.5" (1.664 m)   Wt 184 lb (83.5 kg)   SpO2 98%   BMI 30.15 kg/m  GEN- NAD, alert and oriented x3.well appearing HEENT- PERRL, EOMI, non injected sclera, pink conjunctiva, MMM, oropharynx clear CVS- RRR, no murmur RESP-CTAB ABD-NABS,soft,NT,ND, usostomy in tact- clear urine  EXT- No edema, LEFT Foot- small pea size nodule midfoot adjacent to tendon, FROM ankle, NT, no erythema  Pulses- Radial, DP- 2+         Assessment & Plan:       Problem List Items Addressed This Visit      Unprioritized   Diarrhea - Primary    She has had multiple bouts of diarrhea but has had intermittent diarrhea for many years.  She has however been on antibiotics for urinary tract infection and has been working at [facility] so I am going to send her home with stool cultures to do for C. difficile as well. Currently without any diarrhea but this could be because of the large amounts of Imodium that she was taking discussed the proper dosing of Imodium.  We will check a CBC as well as her metabolic panel.  If the weight loss insurance from the decreased intake in the setting of all the diarrhea.      Relevant Orders   CBC with Differential/Platelet   Basic metabolic panel   Clostridium difficile Toxin B, Qualitative, Real-Time PCR([facility])   Stool culture   Clostridium difficile Toxin B, Qualitative, Real-Time PCR([facility])    Other Visit Diagnoses    Ganglion cyst       Swelling has improved around the cyst area which is been a monitor for now      Note: This dictation was prepared with Dragon dictation along with smaller phrase technology. Any transcriptional errors that result from this process are unintentional.

## 2017-07-15 NOTE — Patient Instructions (Signed)
F/U  As previous Return the stool samples

## 2017-07-15 NOTE — Assessment & Plan Note (Signed)
She has had multiple bouts of diarrhea but has had intermittent diarrhea for many years.  She has however been on antibiotics for urinary tract infection and has been working at [facility] so I am going to send her home with stool cultures to do for C. difficile as well. Currently without any diarrhea but this could be because of the large amounts of Imodium that she was taking discussed the proper dosing of Imodium.  We will check a CBC as well as her metabolic panel.  If the weight loss insurance from the decreased intake in the setting of all the diarrhea.

## 2017-07-16 ENCOUNTER — Other Ambulatory Visit: Payer: Medicare Other

## 2017-07-16 DIAGNOSIS — R197 Diarrhea, unspecified: Secondary | ICD-10-CM | POA: Diagnosis not present

## 2017-07-17 LAB — CLOSTRIDIUM DIFFICILE TOXIN B, QUALITATIVE, REAL-TIME PCR: Toxigenic C. Difficile by PCR: NOT DETECTED

## 2017-07-18 LAB — CLOSTRIDIUM DIFFICILE TOXIN B, QUALITATIVE, REAL-TIME PCR: Toxigenic C. Difficile by PCR: NOT DETECTED

## 2017-07-21 ENCOUNTER — Other Ambulatory Visit: Payer: Self-pay | Admitting: Family Medicine

## 2017-07-22 DIAGNOSIS — Z936 Other artificial openings of urinary tract status: Secondary | ICD-10-CM | POA: Diagnosis not present

## 2017-07-22 DIAGNOSIS — C679 Malignant neoplasm of bladder, unspecified: Secondary | ICD-10-CM | POA: Diagnosis not present

## 2017-07-23 ENCOUNTER — Ambulatory Visit (INDEPENDENT_AMBULATORY_CARE_PROVIDER_SITE_OTHER): Payer: Medicare Other | Admitting: Family Medicine

## 2017-07-23 ENCOUNTER — Encounter: Payer: Self-pay | Admitting: Family Medicine

## 2017-07-23 VITALS — BP 142/88 | HR 72 | Temp 98.2°F | Resp 16 | Wt 187.0 lb

## 2017-07-23 DIAGNOSIS — R2242 Localized swelling, mass and lump, left lower limb: Secondary | ICD-10-CM | POA: Diagnosis not present

## 2017-07-23 DIAGNOSIS — G8929 Other chronic pain: Secondary | ICD-10-CM | POA: Diagnosis not present

## 2017-07-23 DIAGNOSIS — R109 Unspecified abdominal pain: Secondary | ICD-10-CM

## 2017-07-23 DIAGNOSIS — M7989 Other specified soft tissue disorders: Secondary | ICD-10-CM

## 2017-07-23 MED ORDER — OXYCODONE HCL 5 MG PO TABS: 5.0000 mg | ORAL_TABLET | Freq: Four times a day (QID) | ORAL | 0 refills | Status: DC | PRN

## 2017-07-23 MED ORDER — MORPHINE SULFATE ER 15 MG PO TBCR: 15.0000 mg | EXTENDED_RELEASE_TABLET | Freq: Two times a day (BID) | ORAL | 0 refills | Status: DC

## 2017-07-23 NOTE — Assessment & Plan Note (Signed)
Chronic abd/rectal pain Medications refilled

## 2017-07-23 NOTE — Patient Instructions (Addendum)
Soft baby food consistency For next 3 days  Ultrasound to be done F/U 4 months

## 2017-07-23 NOTE — Progress Notes (Signed)
   Subjective:    Patient ID: [name], female    DOB: 1950/07/19, 67 y.o.   MRN: [id number]  Patient presents for Knot on buttock x 2-3 months - getting worse and Medication Refill (oxy and morphine)  Pt here with knot on left  buttocks for past few months. Unsure if it is getting larger. Was trying to ignore it.  No drainage from area, a little tender. Felt it may be connected with her rectal pain? Diarrhea has now resolved, but she actually stopped eating a few days to get it to resolve  Request refill on her pain medications   Review Of Systems:  GEN- denies fatigue, fever, weight loss,weakness, recent illness HEENT- denies eye drainage, change in vision, nasal discharge, CVS- denies chest pain, palpitations RESP- denies SOB, cough, wheeze ABD- denies N/V, change in stools, abd pain GU- denies dysuria, hematuria, dribbling, incontinence MSK- denies joint pain, muscle aches, injury Neuro- denies headache, dizziness, syncope, seizure activity       Objective:    BP (!) 142/88   Pulse 72   Temp 98.2 F (36.8 C) (Oral)   Resp 16   Wt 187 lb (84.8 kg)   SpO2 97%   BMI 30.65 kg/m  GEN- NAD, alert and oriented x3 CVS- RRR, no murmur RESP-CTAB ABD-NABS,soft,NT,ND, urostomy Skin- Left buttocks near gluteal cleft but in soft tissue- small hardened area quarter size, no fluctuance, non mobile, NT, skin on top flesh toned, no erythema, no ulceration or opening , no lesion on right buttocks  EXT- No edema Pulses- Radial 2+        Assessment & Plan:      Problem List Items Addressed This Visit    None    Visit Diagnoses    Mass of soft tissue of left lower extremity    -  Primary   Obtain US, does not appear to be abscessed, may be non specific hard subcutaenous or fatty tissue, possible cyst or benign tumor but very non discrete. She does have significant rectal pain and issues but no sign of fistula to this spot    Relevant Orders   [place] LT LOWER EXTREM LTD  SOFT TISSUE NON VASCULAR      Note: This dictation was prepared with Dragon dictation along with smaller phrase technology. Any transcriptional errors that result from this process are unintentional.

## 2017-07-24 DIAGNOSIS — C679 Malignant neoplasm of bladder, unspecified: Secondary | ICD-10-CM | POA: Diagnosis not present

## 2017-07-24 DIAGNOSIS — Z936 Other artificial openings of urinary tract status: Secondary | ICD-10-CM | POA: Diagnosis not present

## 2017-07-25 ENCOUNTER — Other Ambulatory Visit: Payer: Self-pay | Admitting: *Deleted

## 2017-07-25 DIAGNOSIS — K6289 Other specified diseases of anus and rectum: Secondary | ICD-10-CM

## 2017-07-25 LAB — STOOL CULTURE

## 2017-07-25 LAB — TIQ-NTM

## 2017-07-29 ENCOUNTER — Ambulatory Visit (HOSPITAL_COMMUNITY)
Admission: RE | Admit: 2017-07-29 | Discharge: 2017-07-29 | Disposition: A | Discharge status: Home / Self Care | Payer: Medicare Other | Source: Ambulatory Visit | Attending: Family Medicine | Admitting: Family Medicine

## 2017-07-29 DIAGNOSIS — K6289 Other specified diseases of anus and rectum: Secondary | ICD-10-CM | POA: Insufficient documentation

## 2017-07-29 DIAGNOSIS — L0231 Cutaneous abscess of buttock: Secondary | ICD-10-CM | POA: Diagnosis not present

## 2017-08-07 DIAGNOSIS — R350 Frequency of micturition: Secondary | ICD-10-CM | POA: Diagnosis not present

## 2017-08-07 DIAGNOSIS — Z79899 Other long term (current) drug therapy: Secondary | ICD-10-CM | POA: Diagnosis not present

## 2017-08-07 DIAGNOSIS — Z8551 Personal history of malignant neoplasm of bladder: Secondary | ICD-10-CM | POA: Diagnosis not present

## 2017-08-07 DIAGNOSIS — Z7901 Long term (current) use of anticoagulants: Secondary | ICD-10-CM | POA: Diagnosis not present

## 2017-08-07 DIAGNOSIS — Z87891 Personal history of nicotine dependence: Secondary | ICD-10-CM | POA: Diagnosis not present

## 2017-08-07 DIAGNOSIS — M109 Gout, unspecified: Secondary | ICD-10-CM | POA: Diagnosis not present

## 2017-08-07 DIAGNOSIS — E7800 Pure hypercholesterolemia, unspecified: Secondary | ICD-10-CM | POA: Diagnosis not present

## 2017-08-07 DIAGNOSIS — Z86718 Personal history of other venous thrombosis and embolism: Secondary | ICD-10-CM | POA: Diagnosis not present

## 2017-08-11 ENCOUNTER — Encounter: Payer: Self-pay | Admitting: *Deleted

## 2017-08-11 ENCOUNTER — Telehealth: Payer: Self-pay | Admitting: Family Medicine

## 2017-08-11 DIAGNOSIS — R531 Weakness: Secondary | ICD-10-CM | POA: Diagnosis not present

## 2017-08-11 DIAGNOSIS — M6281 Muscle weakness (generalized): Secondary | ICD-10-CM | POA: Diagnosis not present

## 2017-08-11 DIAGNOSIS — B9629 Other Escherichia coli [E. coli] as the cause of diseases classified elsewhere: Secondary | ICD-10-CM | POA: Diagnosis not present

## 2017-08-11 DIAGNOSIS — Z87891 Personal history of nicotine dependence: Secondary | ICD-10-CM | POA: Diagnosis not present

## 2017-08-11 DIAGNOSIS — Z1612 Extended spectrum beta lactamase (ESBL) resistance: Secondary | ICD-10-CM | POA: Diagnosis not present

## 2017-08-11 DIAGNOSIS — Z7952 Long term (current) use of systemic steroids: Secondary | ICD-10-CM | POA: Diagnosis not present

## 2017-08-11 DIAGNOSIS — Z8551 Personal history of malignant neoplasm of bladder: Secondary | ICD-10-CM | POA: Diagnosis not present

## 2017-08-11 DIAGNOSIS — Z886 Allergy status to analgesic agent status: Secondary | ICD-10-CM | POA: Diagnosis not present

## 2017-08-11 DIAGNOSIS — Z883 Allergy status to other anti-infective agents status: Secondary | ICD-10-CM | POA: Diagnosis not present

## 2017-08-11 DIAGNOSIS — N390 Urinary tract infection, site not specified: Secondary | ICD-10-CM | POA: Diagnosis not present

## 2017-08-11 DIAGNOSIS — Z936 Other artificial openings of urinary tract status: Secondary | ICD-10-CM | POA: Diagnosis not present

## 2017-08-11 DIAGNOSIS — Z79899 Other long term (current) drug therapy: Secondary | ICD-10-CM | POA: Diagnosis not present

## 2017-08-11 DIAGNOSIS — E7800 Pure hypercholesterolemia, unspecified: Secondary | ICD-10-CM | POA: Diagnosis not present

## 2017-08-11 DIAGNOSIS — Z7902 Long term (current) use of antithrombotics/antiplatelets: Secondary | ICD-10-CM | POA: Diagnosis not present

## 2017-08-11 NOTE — Telephone Encounter (Signed)
Call placed to patient and patient made aware per VM.  

## 2017-08-11 NOTE — Telephone Encounter (Signed)
Pt states she went to the ER and that they were wanting her to stay so they could admit her and give IV antibiotic, she did not want to stay so she left AMA, wants to know if dr. Buelah Manis wants her to go back and get IV antibiotics or not.

## 2017-08-11 NOTE — Telephone Encounter (Signed)
Received records from ER.   Nitrate positive UTI with E Coli and hematuria noted sensitive to Bactrim, Zosyn, Ertapenem and Meropenem.   Patient allergic to Bactrim. All other choices are IV.

## 2017-08-11 NOTE — Telephone Encounter (Signed)
She needs to go to ER to be treated back [facility] TO be given IV antibiotics This can not be done outpatient

## 2017-08-11 NOTE — Telephone Encounter (Signed)
Call placed to patient.   States that she was seen at [facility] on Thursday for gout. Was given Prednisone for flare. Also state that urine culture was sent to lab. Reports that she received call over the weekend stating that urine culture did show infection and bacteria was only treatable with IV ABTx. Was advised to return to ER for IV placement and treatment with IV ABTx.   States that she did not return to ER. Requested PCP to advise.   Advised that she should return to ER for IV. States that she does not want to return to ER and requested MD to review labs to see if there is any treatment she can take at home.   Requested records from ER for MD to review.

## 2017-08-12 DIAGNOSIS — N390 Urinary tract infection, site not specified: Secondary | ICD-10-CM | POA: Diagnosis not present

## 2017-08-12 DIAGNOSIS — Z1612 Extended spectrum beta lactamase (ESBL) resistance: Secondary | ICD-10-CM | POA: Diagnosis not present

## 2017-08-12 NOTE — Telephone Encounter (Signed)
Call placed to patient to follow up.   Reports that she has returned to ER. Was given option of having PICC placed and HH to administer IV ABTx or return to ER QD x10 days for IV ABTx.   Patient states that she will be returning to ER QD.   MD made aware.

## 2017-08-13 DIAGNOSIS — N390 Urinary tract infection, site not specified: Secondary | ICD-10-CM | POA: Diagnosis not present

## 2017-08-13 DIAGNOSIS — Z1612 Extended spectrum beta lactamase (ESBL) resistance: Secondary | ICD-10-CM | POA: Diagnosis not present

## 2017-08-13 DIAGNOSIS — R197 Diarrhea, unspecified: Secondary | ICD-10-CM | POA: Diagnosis not present

## 2017-08-14 DIAGNOSIS — Z1612 Extended spectrum beta lactamase (ESBL) resistance: Secondary | ICD-10-CM | POA: Diagnosis not present

## 2017-08-14 DIAGNOSIS — R197 Diarrhea, unspecified: Secondary | ICD-10-CM | POA: Diagnosis not present

## 2017-08-14 DIAGNOSIS — N390 Urinary tract infection, site not specified: Secondary | ICD-10-CM | POA: Diagnosis not present

## 2017-08-15 DIAGNOSIS — N390 Urinary tract infection, site not specified: Secondary | ICD-10-CM | POA: Diagnosis not present

## 2017-08-15 DIAGNOSIS — Z1612 Extended spectrum beta lactamase (ESBL) resistance: Secondary | ICD-10-CM | POA: Diagnosis not present

## 2017-08-15 DIAGNOSIS — R197 Diarrhea, unspecified: Secondary | ICD-10-CM | POA: Diagnosis not present

## 2017-08-16 DIAGNOSIS — R197 Diarrhea, unspecified: Secondary | ICD-10-CM | POA: Diagnosis not present

## 2017-08-16 DIAGNOSIS — N390 Urinary tract infection, site not specified: Secondary | ICD-10-CM | POA: Diagnosis not present

## 2017-08-16 DIAGNOSIS — Z1612 Extended spectrum beta lactamase (ESBL) resistance: Secondary | ICD-10-CM | POA: Diagnosis not present

## 2017-08-17 DIAGNOSIS — R197 Diarrhea, unspecified: Secondary | ICD-10-CM | POA: Diagnosis not present

## 2017-08-17 DIAGNOSIS — Z1612 Extended spectrum beta lactamase (ESBL) resistance: Secondary | ICD-10-CM | POA: Diagnosis not present

## 2017-08-17 DIAGNOSIS — N390 Urinary tract infection, site not specified: Secondary | ICD-10-CM | POA: Diagnosis not present

## 2017-08-18 DIAGNOSIS — R197 Diarrhea, unspecified: Secondary | ICD-10-CM | POA: Diagnosis not present

## 2017-08-18 DIAGNOSIS — N390 Urinary tract infection, site not specified: Secondary | ICD-10-CM | POA: Diagnosis not present

## 2017-08-18 DIAGNOSIS — Z1612 Extended spectrum beta lactamase (ESBL) resistance: Secondary | ICD-10-CM | POA: Diagnosis not present

## 2017-08-19 DIAGNOSIS — N390 Urinary tract infection, site not specified: Secondary | ICD-10-CM | POA: Diagnosis not present

## 2017-08-19 DIAGNOSIS — R197 Diarrhea, unspecified: Secondary | ICD-10-CM | POA: Diagnosis not present

## 2017-08-19 DIAGNOSIS — Z1612 Extended spectrum beta lactamase (ESBL) resistance: Secondary | ICD-10-CM | POA: Diagnosis not present

## 2017-08-20 DIAGNOSIS — R197 Diarrhea, unspecified: Secondary | ICD-10-CM | POA: Diagnosis not present

## 2017-08-20 DIAGNOSIS — N390 Urinary tract infection, site not specified: Secondary | ICD-10-CM | POA: Diagnosis not present

## 2017-08-20 DIAGNOSIS — Z1612 Extended spectrum beta lactamase (ESBL) resistance: Secondary | ICD-10-CM | POA: Diagnosis not present

## 2017-08-21 DIAGNOSIS — Z1612 Extended spectrum beta lactamase (ESBL) resistance: Secondary | ICD-10-CM | POA: Diagnosis not present

## 2017-08-21 DIAGNOSIS — N390 Urinary tract infection, site not specified: Secondary | ICD-10-CM | POA: Diagnosis not present

## 2017-08-21 DIAGNOSIS — R197 Diarrhea, unspecified: Secondary | ICD-10-CM | POA: Diagnosis not present

## 2017-08-25 ENCOUNTER — Inpatient Hospital Stay: Payer: Medicare Other | Admitting: Family Medicine

## 2017-08-29 DIAGNOSIS — C679 Malignant neoplasm of bladder, unspecified: Secondary | ICD-10-CM | POA: Diagnosis not present

## 2017-08-29 DIAGNOSIS — Z936 Other artificial openings of urinary tract status: Secondary | ICD-10-CM | POA: Diagnosis not present

## 2017-09-01 ENCOUNTER — Other Ambulatory Visit: Payer: Self-pay

## 2017-09-01 ENCOUNTER — Encounter: Payer: Self-pay | Admitting: Family Medicine

## 2017-09-01 ENCOUNTER — Ambulatory Visit (INDEPENDENT_AMBULATORY_CARE_PROVIDER_SITE_OTHER): Payer: Medicare Other | Admitting: Family Medicine

## 2017-09-01 VITALS — BP 142/82 | HR 76 | Temp 98.1°F | Resp 16 | Ht 65.5 in | Wt 170.0 lb

## 2017-09-01 DIAGNOSIS — M25531 Pain in right wrist: Secondary | ICD-10-CM

## 2017-09-01 DIAGNOSIS — E038 Other specified hypothyroidism: Secondary | ICD-10-CM | POA: Diagnosis not present

## 2017-09-01 DIAGNOSIS — N390 Urinary tract infection, site not specified: Secondary | ICD-10-CM | POA: Diagnosis not present

## 2017-09-01 DIAGNOSIS — M25431 Effusion, right wrist: Secondary | ICD-10-CM

## 2017-09-01 DIAGNOSIS — K529 Noninfective gastroenteritis and colitis, unspecified: Secondary | ICD-10-CM

## 2017-09-01 DIAGNOSIS — R634 Abnormal weight loss: Secondary | ICD-10-CM | POA: Diagnosis not present

## 2017-09-01 LAB — URINALYSIS, ROUTINE W REFLEX MICROSCOPIC
Bilirubin Urine: NEGATIVE
Glucose, UA: NEGATIVE
KETONES UR: NEGATIVE
NITRITE: NEGATIVE
SPECIFIC GRAVITY, URINE: 1.02 (ref 1.001–1.03)
pH: 5.5 (ref 5.0–8.0)

## 2017-09-01 LAB — MICROSCOPIC MESSAGE

## 2017-09-01 MED ORDER — MORPHINE SULFATE ER 15 MG PO TBCR: 15.0000 mg | EXTENDED_RELEASE_TABLET | Freq: Two times a day (BID) | ORAL | 0 refills | Status: DC

## 2017-09-01 MED ORDER — OXYCODONE HCL 5 MG PO TABS: 5.0000 mg | ORAL_TABLET | Freq: Four times a day (QID) | ORAL | 0 refills | Status: DC | PRN

## 2017-09-01 NOTE — Progress Notes (Signed)
Subjective:    Patient ID: [name], female    DOB: 02-06-1951, 67 y.o.   MRN: [id number]  Patient presents for ER F/U [facility]- UTI - had to be treated with IV ABTx and HTN- BP remianed elevated)  Patient here to follow-up hospital admission.  She went to the ER after having weak spells concern for swelling in her wrist with probable gout urine culture was taken off her urostomy she returned with a resistant bacteria I do not have the cultures.  She initially did not want to return to the emergency room for treatment however was still feeling weak per report.  She did not have any vomiting no abdominal pain.  She wished inpatient for 24 hours given IV antibiotics then discharged home and she came to short stay every day for 9 days.  She developed diarrhea during this time they did check her for C. difficile though she does have history of chronic diarrhea had actually checked her for C. difficile about 6 weeks ago which was negative.  She took a significant amount of Imodium last week and then became blocked up now is having multiple bowel movements again. States that she feels well.  She still has some swelling near her wrist is still not sure if this was gout or not.  DurhingHer admission was also noted that her blood pressure was elevated every time she came in for her IV antibiotics.  States her blood pressure will be into the 150s.  Weight is also down ? 17lbs since 1/23 she states that she cut out the snacking only eats 1-2 meals a day does worsen when she was on the IV antibiotic she did not have a taste for anything.  Review Of Systems:  GEN- denies fatigue, fever, weight loss,weakness, recent illness HEENT- denies eye drainage, change in vision, nasal discharge, CVS- denies chest pain, palpitations RESP- denies SOB, cough, wheeze ABD- denies N/V, +change in stools, abd pain GU- denies dysuria, hematuria, dribbling, incontinence MSK- denies joint pain, muscle aches,  injury Neuro- denies headache, dizziness, syncope, seizure activity       Objective:    BP (!) 142/82   Pulse 76   Temp 98.1 F (36.7 C) (Oral)   Resp 16   Ht 5' 5.5" (1.664 m)   Wt 170 lb (77.1 kg)   SpO2 98%   BMI 27.86 kg/m  GEN- NAD, alert and oriented x3 HEENT- PERRL, EOMI, non injected sclera, pink conjunctiva, MMM, oropharynx clear Neck- Supple, no thyromegaly CVS- RRR, no murmur RESP-CTAB ABD-NABS,soft,NT,ND,urostomy in tact  EXT- No edema, MSK wrist NT, good ROMmild swelling at Right  wrist  Pulses- Radial  2+        Assessment & Plan:  Requested refill on pain medications this was refilled today.   Problem List Items Addressed This Visit      Unprioritized   Recurrent UTI - Primary    We will obtain the records from short stay to get the actual resistant bacteria that came up in her urine.  She also follows with [facility].  If she does have another resistant bug especially without her being truly symptomatic think we need to discuss with them the best way to proceed.  I think she is colonized with significant bacteria which has stated multiple times before.      Relevant Orders   Urinalysis, Routine w reflex microscopic   Urine Culture   CBC with Differential/Platelet   Comprehensive metabolic panel  Loss of weight    Skin change in her appetite however her weight does fluctuate a lot as well.  She looks overall healthy today.  She is now off of IV antibiotics and her appetite has improved will recheck her weight in 4 weeks.      Hypothyroidism    Recheck thyroid function studies  Also check a uric acid with regards to the possible gout      Relevant Orders   TSH   T3, free   T4, free    Other Visit Diagnoses    Pain and swelling of right wrist       Relevant Orders   Uric Acid   Chronic diarrhea       He has been checked for C. difficile twice which is been negative.  We will put her on probiotics she also overdoes Imodium and then  has to use laxatives.      Note: This dictation was prepared with Dragon dictation along with smaller phrase technology. Any transcriptional errors that result from this process are unintentional.

## 2017-09-01 NOTE — Assessment & Plan Note (Signed)
Skin change in her appetite however her weight does fluctuate a lot as well.  She looks overall healthy today.  She is now off of IV antibiotics and her appetite has improved will recheck her weight in 4 weeks.

## 2017-09-01 NOTE — Assessment & Plan Note (Signed)
We will obtain the records from short stay to get the actual resistant bacteria that came up in her urine.  She also follows with [facility].  If she does have another resistant bug especially without her being truly symptomatic think we need to discuss with them the best way to proceed.  I think she is colonized with significant bacteria which has stated multiple times before.

## 2017-09-01 NOTE — Patient Instructions (Signed)
Check your blood pressure at home - three times a week Probiotics Align/ Restora We will call with lab results  F/U 4 weeks

## 2017-09-01 NOTE — Assessment & Plan Note (Signed)
Recheck thyroid function studies  Also check a uric acid with regards to the possible gout

## 2017-09-02 ENCOUNTER — Encounter: Payer: Self-pay | Admitting: *Deleted

## 2017-09-02 LAB — COMPREHENSIVE METABOLIC PANEL
AG Ratio: 1.4 (calc) (ref 1.0–2.5)
ALBUMIN MSPROF: 3.9 g/dL (ref 3.6–5.1)
ALT: 12 U/L (ref 6–29)
AST: 14 U/L (ref 10–35)
Alkaline phosphatase (APISO): 73 U/L (ref 33–130)
BUN / CREAT RATIO: 15 (calc) (ref 6–22)
BUN: 17 mg/dL (ref 7–25)
CHLORIDE: 109 mmol/L (ref 98–110)
CO2: 24 mmol/L (ref 20–32)
CREATININE: 1.15 mg/dL — AB (ref 0.50–0.99)
Calcium: 9.2 mg/dL (ref 8.6–10.4)
GLOBULIN: 2.8 g/dL (ref 1.9–3.7)
GLUCOSE: 139 mg/dL — AB (ref 65–99)
POTASSIUM: 4.3 mmol/L (ref 3.5–5.3)
SODIUM: 141 mmol/L (ref 135–146)
Total Bilirubin: 0.5 mg/dL (ref 0.2–1.2)
Total Protein: 6.7 g/dL (ref 6.1–8.1)

## 2017-09-02 LAB — CBC WITH DIFFERENTIAL/PLATELET
Basophils Absolute: 33 cells/uL (ref 0–200)
Basophils Relative: 0.4 %
Eosinophils Absolute: 166 cells/uL (ref 15–500)
Eosinophils Relative: 2 %
HCT: 37.8 % (ref 35.0–45.0)
Hemoglobin: 12.4 g/dL (ref 11.7–15.5)
Lymphs Abs: 1112 cells/uL (ref 850–3900)
MCH: 25.9 pg — ABNORMAL LOW (ref 27.0–33.0)
MCHC: 32.8 g/dL (ref 32.0–36.0)
MCV: 79.1 fL — AB (ref 80.0–100.0)
MONOS PCT: 6.1 %
MPV: 10.6 fL (ref 7.5–12.5)
Neutro Abs: 6482 cells/uL (ref 1500–7800)
Neutrophils Relative %: 78.1 %
PLATELETS: 332 10*3/uL (ref 140–400)
RBC: 4.78 10*6/uL (ref 3.80–5.10)
RDW: 15.6 % — ABNORMAL HIGH (ref 11.0–15.0)
TOTAL LYMPHOCYTE: 13.4 %
WBC: 8.3 10*3/uL (ref 3.8–10.8)
WBCMIX: 506 {cells}/uL (ref 200–950)

## 2017-09-02 LAB — T4, FREE: FREE T4: 1.5 ng/dL (ref 0.8–1.8)

## 2017-09-02 LAB — TSH: TSH: 0.77 mIU/L (ref 0.40–4.50)

## 2017-09-02 LAB — T3, FREE: T3, Free: 2.8 pg/mL (ref 2.3–4.2)

## 2017-09-03 LAB — URINE CULTURE
MICRO NUMBER:: 90275136
SPECIMEN QUALITY: ADEQUATE

## 2017-09-09 DIAGNOSIS — N3020 Other chronic cystitis without hematuria: Secondary | ICD-10-CM | POA: Diagnosis not present

## 2017-09-09 DIAGNOSIS — Z8551 Personal history of malignant neoplasm of bladder: Secondary | ICD-10-CM | POA: Diagnosis not present

## 2017-09-24 ENCOUNTER — Other Ambulatory Visit: Payer: Self-pay | Admitting: Family Medicine

## 2017-09-24 ENCOUNTER — Inpatient Hospital Stay: Payer: Medicare Other | Admitting: Family Medicine

## 2017-09-29 DIAGNOSIS — C679 Malignant neoplasm of bladder, unspecified: Secondary | ICD-10-CM | POA: Diagnosis not present

## 2017-09-29 DIAGNOSIS — Z936 Other artificial openings of urinary tract status: Secondary | ICD-10-CM | POA: Diagnosis not present

## 2017-10-03 ENCOUNTER — Encounter: Payer: Self-pay | Admitting: Family Medicine

## 2017-10-03 ENCOUNTER — Ambulatory Visit (INDEPENDENT_AMBULATORY_CARE_PROVIDER_SITE_OTHER): Payer: Medicare Other | Admitting: Family Medicine

## 2017-10-03 VITALS — BP 140/88 | HR 64 | Temp 98.1°F | Resp 16 | Ht 66.0 in | Wt 181.8 lb

## 2017-10-03 DIAGNOSIS — R197 Diarrhea, unspecified: Secondary | ICD-10-CM

## 2017-10-03 DIAGNOSIS — R109 Unspecified abdominal pain: Secondary | ICD-10-CM

## 2017-10-03 DIAGNOSIS — K6289 Other specified diseases of anus and rectum: Secondary | ICD-10-CM | POA: Diagnosis not present

## 2017-10-03 DIAGNOSIS — I10 Essential (primary) hypertension: Secondary | ICD-10-CM | POA: Diagnosis not present

## 2017-10-03 DIAGNOSIS — G8929 Other chronic pain: Secondary | ICD-10-CM

## 2017-10-03 MED ORDER — HYDROCORTISONE 2.5 % EX CREA: TOPICAL_CREAM | CUTANEOUS | 11 refills | Status: DC

## 2017-10-03 MED ORDER — OXYCODONE HCL 5 MG PO TABS: 5.0000 mg | ORAL_TABLET | Freq: Four times a day (QID) | ORAL | 0 refills | Status: DC | PRN

## 2017-10-03 MED ORDER — DEXLANSOPRAZOLE 60 MG PO CPDR: 1.0000 | DELAYED_RELEASE_CAPSULE | Freq: Every day | ORAL | 1 refills | Status: DC

## 2017-10-03 MED ORDER — MAGNESIUM OXIDE -MG SUPPLEMENT 400 (240 MG) MG PO TABS: ORAL_TABLET | ORAL | 2 refills | Status: DC

## 2017-10-03 NOTE — Progress Notes (Signed)
Subjective:    Patient ID: [name], female    DOB: 11-Feb-1951, 67 y.o.   MRN: [id number]  Patient presents for 1 month follow up and refill on medications (hydrocortisone, morphine,oxycodone)  Patient here for interim follow-up on her weight.  Her weight has decreased about 170 at the last visit she thinks may be the scales were long as she weighed herself another doctor and at home she was still about 180.  Her appetite was also good.  She has however had her recurrent abdominal discomfort with mostly diarrhea for the past few days states that she was constipated and had not had a bowel movement about 2 days until she had one now has more of the looser stool.  She has been taking Imodium because she had some family in town.  She has not had any fever no blood in the stool.  She actually did not eat or drink anything all day today because she did not however have a diarrhea stool.  Medications were reviewed in detail.  She has not been taking the MS Contin twice a day will skip some days or take only once a day.  States that the oxycodone works faster so she will take 2 of those at a time that we have discussed her pain medicine in the past and the need for long-acting because she uses a good amount of short acting medication.  The following of her blood pressure she has history of hypertension she has been on Lasix blood pressure has been controlled however the last visit blood pressure was elevated 148/98 at home her blood pressures range from 129 158/89-90 she has not been taking the Lasix states that she just gets tired of taking pills or has not been taking regularly.   Review Of Systems:  GEN- denies fatigue, fever, weight loss,weakness, recent illness HEENT- denies eye drainage, change in vision, nasal discharge, CVS- denies chest pain, palpitations RESP- denies SOB, cough, wheeze ABD- denies N/V, +change in stools, abd pain GU- denies dysuria, hematuria, dribbling,  incontinence MSK- denies joint pain, muscle aches, injury Neuro- denies headache, dizziness, syncope, seizure activity       Objective:    BP 140/88   Pulse 64   Temp 98.1 F (36.7 C) (Oral)   Resp 16   Ht 5\' 6"  (1.676 m)   Wt 181 lb 12.8 oz (82.5 kg)   SpO2 98%   BMI 29.34 kg/m  GEN- NAD, alert and oriented x3 HEENT- PERRL, EOMI, non injected sclera, pink conjunctiva, MMM, oropharynx clear CVS- RRR, no murmur RESP-CTAB ABD-NABS,soft,NT,ND, colostomy in tact EXT- trace pedal edema Pulses- Radial  2+        Assessment & Plan:      Problem List Items Addressed This Visit      Unprioritized   Chronic abdominal pain   Relevant Medications   oxyCODONE (OXY IR/ROXICODONE) 5 MG immediate release tablet   Essential hypertension - Primary    Uncontrolled but not taking her lasix as prescribed Discussed importance of compliance  Restart lasix on Monday due to diarrhea, and her not eating and drinking like she should Take BP at home call in 1 week before adding other medication, can add lisinopril 20mg        Diarrhea    Chronic problem for her either constipated or diarrhea No sign of SBO Recommend liquid diet for 48 hours and then soft foods       Chronic rectal pain    Weight  back to baseline For chronic pain,discussed need to take her long acting medication as prescribed, I will not increase her breakthrough meds which she is still taking too much of          Note: This dictation was prepared with Dragon dictation along with smaller phrase technology. Any transcriptional errors that result from this process are unintentional.

## 2017-10-03 NOTE — Patient Instructions (Addendum)
Take the MS Contin twice a day  Restart the lasix on Monday with potassium  Clear fluids for 48 hours then baby soft foods Call Next Thursday with blood pressure results  F/U 2 months

## 2017-10-05 ENCOUNTER — Encounter: Payer: Self-pay | Admitting: Family Medicine

## 2017-10-05 NOTE — Assessment & Plan Note (Signed)
Chronic problem for her either constipated or diarrhea No sign of SBO Recommend liquid diet for 48 hours and then soft foods

## 2017-10-05 NOTE — Assessment & Plan Note (Signed)
Weight back to baseline For chronic pain,discussed need to take her long acting medication as prescribed, I will not increase her breakthrough meds which she is still taking too much of

## 2017-10-05 NOTE — Assessment & Plan Note (Addendum)
Uncontrolled but not taking her lasix as prescribed Discussed importance of compliance  Restart lasix on Monday due to diarrhea, and her not eating and drinking like she should Take BP at home call in 1 week before adding other medication, can add lisinopril 20mg 

## 2017-10-13 ENCOUNTER — Emergency Department (HOSPITAL_COMMUNITY): Payer: Medicare Other

## 2017-10-13 ENCOUNTER — Emergency Department (HOSPITAL_COMMUNITY)
Admission: EM | Admit: 2017-10-13 | Discharge: 2017-10-13 | Disposition: A | Discharge status: Home / Self Care | Payer: Medicare Other | Attending: Emergency Medicine | Admitting: Emergency Medicine

## 2017-10-13 ENCOUNTER — Encounter (HOSPITAL_COMMUNITY): Payer: Self-pay | Admitting: Emergency Medicine

## 2017-10-13 ENCOUNTER — Telehealth: Payer: Self-pay | Admitting: *Deleted

## 2017-10-13 ENCOUNTER — Other Ambulatory Visit: Payer: Self-pay

## 2017-10-13 ENCOUNTER — Ambulatory Visit: Payer: Medicare Other | Admitting: Family Medicine

## 2017-10-13 DIAGNOSIS — I10 Essential (primary) hypertension: Secondary | ICD-10-CM | POA: Insufficient documentation

## 2017-10-13 DIAGNOSIS — N390 Urinary tract infection, site not specified: Secondary | ICD-10-CM

## 2017-10-13 DIAGNOSIS — Z79899 Other long term (current) drug therapy: Secondary | ICD-10-CM | POA: Diagnosis not present

## 2017-10-13 DIAGNOSIS — Z8551 Personal history of malignant neoplasm of bladder: Secondary | ICD-10-CM | POA: Insufficient documentation

## 2017-10-13 DIAGNOSIS — Z7901 Long term (current) use of anticoagulants: Secondary | ICD-10-CM | POA: Insufficient documentation

## 2017-10-13 DIAGNOSIS — E039 Hypothyroidism, unspecified: Secondary | ICD-10-CM | POA: Insufficient documentation

## 2017-10-13 DIAGNOSIS — R1030 Lower abdominal pain, unspecified: Secondary | ICD-10-CM | POA: Diagnosis not present

## 2017-10-13 DIAGNOSIS — Z87891 Personal history of nicotine dependence: Secondary | ICD-10-CM | POA: Diagnosis not present

## 2017-10-13 DIAGNOSIS — R109 Unspecified abdominal pain: Secondary | ICD-10-CM | POA: Diagnosis not present

## 2017-10-13 LAB — COMPREHENSIVE METABOLIC PANEL
ALBUMIN: 3.4 g/dL — AB (ref 3.5–5.0)
ALK PHOS: 59 U/L (ref 38–126)
ALT: 17 U/L (ref 14–54)
AST: 21 U/L (ref 15–41)
Anion gap: 11 (ref 5–15)
BILIRUBIN TOTAL: 0.7 mg/dL (ref 0.3–1.2)
BUN: 16 mg/dL (ref 6–20)
CALCIUM: 7.8 mg/dL — AB (ref 8.9–10.3)
CO2: 20 mmol/L — ABNORMAL LOW (ref 22–32)
Chloride: 108 mmol/L (ref 101–111)
Creatinine, Ser: 0.99 mg/dL (ref 0.44–1.00)
GFR, EST NON AFRICAN AMERICAN: 58 mL/min — AB (ref >60)
GLUCOSE: 144 mg/dL — AB (ref 65–99)
Potassium: 3.3 mmol/L — ABNORMAL LOW (ref 3.5–5.1)
Sodium: 139 mmol/L (ref 135–145)
TOTAL PROTEIN: 6.8 g/dL (ref 6.5–8.1)

## 2017-10-13 LAB — URINALYSIS, ROUTINE W REFLEX MICROSCOPIC
Bilirubin Urine: NEGATIVE
GLUCOSE, UA: NEGATIVE mg/dL
Ketones, ur: NEGATIVE mg/dL
NITRITE: POSITIVE — AB
PROTEIN: 30 mg/dL — AB
SPECIFIC GRAVITY, URINE: 1.014 (ref 1.005–1.030)
pH: 5 (ref 5.0–8.0)

## 2017-10-13 LAB — CBC
HCT: 35.3 % — ABNORMAL LOW (ref 36.0–46.0)
HEMOGLOBIN: 11.6 g/dL — AB (ref 12.0–15.0)
MCH: 25.3 pg — ABNORMAL LOW (ref 26.0–34.0)
MCHC: 32.9 g/dL (ref 30.0–36.0)
MCV: 76.9 fL — ABNORMAL LOW (ref 78.0–100.0)
Platelets: 285 10*3/uL (ref 150–400)
RBC: 4.59 MIL/uL (ref 3.87–5.11)
RDW: 16.2 % — ABNORMAL HIGH (ref 11.5–15.5)
WBC: 6.4 10*3/uL (ref 4.0–10.5)

## 2017-10-13 LAB — LIPASE, BLOOD: Lipase: 21 U/L (ref 11–51)

## 2017-10-13 MED ORDER — HYDROCODONE-ACETAMINOPHEN 5-325 MG PO TABS
2.0000 | ORAL_TABLET | Freq: Once | ORAL | Status: AC
  Administered 2017-10-13: 2 via ORAL
  Filled 2017-10-13: qty 2

## 2017-10-13 MED ORDER — CIPROFLOXACIN HCL 250 MG PO TABS
500.0000 mg | ORAL_TABLET | Freq: Once | ORAL | Status: AC
  Administered 2017-10-13: 500 mg via ORAL
  Filled 2017-10-13: qty 2

## 2017-10-13 MED ORDER — IBUPROFEN 600 MG PO TABS: 600.0000 mg | ORAL_TABLET | Freq: Four times a day (QID) | ORAL | 0 refills | Status: DC | PRN

## 2017-10-13 MED ORDER — CIPROFLOXACIN HCL 500 MG PO TABS: 500.0000 mg | ORAL_TABLET | Freq: Two times a day (BID) | ORAL | 0 refills | Status: AC

## 2017-10-13 NOTE — ED Provider Notes (Signed)
[facility] EMERGENCY DEPARTMENT Provider Note   CSN: [id number] Arrival date & time: 10/13/17  1402     History   Chief Complaint Chief Complaint  Patient presents with  . Abdominal Pain    HPI [name] is a 67 y.o. female.  The patient is a 67 year old female, she presents to the hospital today with a chief complaint of abdominal pain  The abdominal pain started approximately 1 week ago, she has had similar abdominal pain in the past, she is known to have bladder cancer status post surgery, history of surgery and radiation with a diverting urostomy however postoperatively she had multiple complications including several episodes of perforated intestines according to the patient's report.  She is required multiple surgeries and went on total parenteral nutrition for quite some time however over the last several years she has done very well.  About 1 week ago she started to develop increasing abdominal pain which she locates to her lower mid abdomen, it is increased after eating, intermittent but seems to be persistent over the week.  Specifically she has no nausea, no vomiting, no diarrhea and no constipation.  There has been no fevers coughing chest pain or shortness of breath.  She reports that she lives with chronic abdominal pain, she takes morphine and oxycodone, she has had multiple workups which have not shown a definitive etiology however she does have intermittent obstructions from adhesions.   Abdominal Pain      Past Medical History:  Diagnosis Date  . Acid reflux   . Bowel obstruction (HCC) several   Recurrent SBO secondary to adhesions  . Cancer [facility]) 2011 [facility])   diagnosed in 2009 per pt. Invasive High grade Urothelial carcinoma- s/p radiation and Chemo   . Chronic back pain   . DVT (deep venous thrombosis) ([place]) 2015  . Fatty liver   . Hyperlipidemia   . Hypertension    pt says taken off medication since has lost weight.  . Hypothyroidism     . Internal hemorrhoids   . Leukopenia 12/06/2011   HIV serology negative  . Malnutrition ([place])   . PE (pulmonary embolism)    PER [facility] records  . Rectal ulcer 2016   [facility]  . UTI (lower urinary tract infection)    Recurrent    Patient Active Problem List   Diagnosis Date Noted  . Peripheral edema 05/27/2016  . Chronic rectal pain 06/02/2015  . Rectal ulcer 11/08/2014  . GERD (gastroesophageal reflux disease) 11/08/2014  . Fistula 06/27/2014  . Protein-calorie malnutrition ([place]) 06/27/2014  . Parotid mass 06/27/2014  . BV (bacterial vaginosis) 04/05/2014  . Lung nodule 04/05/2014  . Loss of weight 02/09/2014  . Grief reaction 01/28/2014  . Leg swelling 01/14/2014  . Lung infiltrate on CT 01/14/2014  . DVT (deep venous thrombosis) ([place]) 12/17/2013  . Partial small bowel obstruction ([place]) 07/21/2013  . Chronic abdominal pain 09/23/2012  . Microcytic anemia 08/26/2012  . Encounter for screening colonoscopy 05/26/2012  . Occlusion of peripherally inserted central catheter (PICC) line ([place]) 02/27/2012  . Post-menopausal bleeding 12/19/2011  . Recurrent UTI 12/05/2011  . Diarrhea 12/05/2011  . SBO (small bowel obstruction) ([place]) 10/02/2011  . Fatty liver 10/02/2011  . Elevated LFTs 09/03/2011  . Hx of bladder cancer 09/03/2011  . Bowel obstruction ([place]) 09/03/2011  . On total parenteral nutrition (TPN) 09/03/2011  . Hyperlipidemia 01/23/2007  . TOBACCO USE 08/22/2006  . Hypothyroidism 07/21/2006  . Essential hypertension 07/21/2006  . ARTHRITIS 07/21/2006  Past Surgical History:  Procedure Laterality Date  . ABDOMINAL SURGERY     with intestinal "puncture" x 2, exploratory surgery   . BACK SURGERY     X2  . BLADDER REMOVAL    . CHOLECYSTECTOMY    . CYSTECTOMY     breast  . HERNIA REPAIR     mesh  . ILEO CONDUIT     For bladder cancer  . PORTACATH PLACEMENT       OB History    Gravida      Para      Term      Preterm      AB       Living  2     SAB      TAB      Ectopic      Multiple      Live Births               Home Medications    Prior to Admission medications   Medication Sig Start Date End Date Taking? Authorizing Provider  dexlansoprazole (DEXILANT) 60 MG capsule Take 1 capsule (60 mg total) by mouth daily. 10/03/17  Yes [place], Modena Nunnery, MD  ELIQUIS 5 MG TABS tablet TAKE ONE TABLET BY MOUTH TWICE DAILY 07/21/17  Yes [place], Modena Nunnery, MD  furosemide (LASIX) 40 MG tablet TAKE ONE TABLET BY MOUTH TWICE DAILY Patient taking differently: TAKE ONE TABLET BY MOUTH TWICE DAILY AS NEEDED FOR FLUID 06/27/17  Yes [place], Modena Nunnery, MD  hydrocortisone 2.5 % cream APPLY RECTALLY TWICE DAILY AS NEEDED 10/03/17  Yes [place], Modena Nunnery, MD  levothyroxine (SYNTHROID, LEVOTHROID) 125 MCG tablet Take 1 tablet (125 mcg total) by mouth daily. 06/05/17  Yes [place], Modena Nunnery, MD  lidocaine (XYLOCAINE) 5 % ointment APPLY AS DIRECTED AS NEEDED.PLEASE DISPENSE 3 TUBES 06/27/17  Yes [place], Modena Nunnery, MD  Magnesium Oxide 400 (240 Mg) MG TABS Take 1 tablet daily Patient taking differently: Take 1 tablet by mouth daily.  10/03/17  Yes [place], Modena Nunnery, MD  morphine (MS CONTIN) 15 MG 12 hr tablet Take 1 tablet (15 mg total) by mouth every 12 (twelve) hours. 09/01/17  Yes [place], Modena Nunnery, MD  Multiple Vitamins-Minerals (MULTIVITAMIN PO) Take 1 tablet by mouth daily.   Yes [provider]  ondansetron (ZOFRAN-ODT) 8 MG disintegrating tablet TAKE ONE TABLET BY MOUTH EVERY 8 HOURS AS NEEDED FOR NAUSEA 05/29/16  Yes [place], Modena Nunnery, MD  oxyCODONE (OXY IR/ROXICODONE) 5 MG immediate release tablet Take 1 tablet (5 mg total) by mouth every 6 (six) hours as needed for severe pain. 10/03/17  Yes [place], Modena Nunnery, MD  potassium chloride SA (K-DUR,KLOR-CON) 20 MEQ tablet TAKE ONE TABLET BY MOUTH DAILY 09/24/17  Yes [place], Modena Nunnery, MD  pravastatin (PRAVACHOL) 20 MG tablet TAKE ONE TABLET BY MOUTH DAILY FOR CHOLESTEROL 07/14/17  Yes  [place], Modena Nunnery, MD  ciprofloxacin (CIPRO) 500 MG tablet Take 1 tablet (500 mg total) by mouth every 12 (twelve) hours for 7 days. 10/13/17 10/20/17  Noemi Chapel, MD  ibuprofen (ADVIL,MOTRIN) 600 MG tablet Take 1 tablet (600 mg total) by mouth every 6 (six) hours as needed. 10/13/17   Noemi Chapel, MD    Family History Family History  Problem Relation Age of Onset  . Heart disease Mother        enlarged  . Hypertension Mother   . Diabetes Father   . Diabetes Sister   . Diabetes Maternal Grandmother   .  Colon cancer Neg Hx     Social History Social History   Tobacco Use  . Smoking status: Former Smoker    Packs/day: 0.10    Types: Cigarettes    Last attempt to quit: 10/03/2016    Years since quitting: 1.0  . Smokeless tobacco: Never Used  Substance Use Topics  . Alcohol use: No  . Drug use: No     Allergies   Bactrim [sulfamethoxazole-trimethoprim]; Cefixime; Codeine; Lactose; Nitrofuran derivatives; Penicillins; and Sulfa antibiotics   Review of Systems Review of Systems  Gastrointestinal: Positive for abdominal pain.  All other systems reviewed and are negative.    Physical Exam Updated Vital Signs BP (!) 147/84   Pulse (!) 59   Temp 98.4 F (36.9 C) (Oral)   Resp 16   Ht 5' 5.5" (1.664 m)   Wt 82.1 kg (181 lb)   SpO2 98%   BMI 29.66 kg/m   Physical Exam  Constitutional: She appears well-developed and well-nourished. No distress.  HENT:  Head: Normocephalic and atraumatic.  Mouth/Throat: Oropharynx is clear and moist. No oropharyngeal exudate.  Eyes: Pupils are equal, round, and reactive to light. Conjunctivae and EOM are normal. Right eye exhibits no discharge. Left eye exhibits no discharge. No scleral icterus.  Neck: Normal range of motion. Neck supple. No JVD present. No thyromegaly present.  Cardiovascular: Normal rate, regular rhythm, normal heart sounds and intact distal pulses. Exam reveals no gallop and no friction rub.  No murmur  heard. Pulmonary/Chest: Effort normal and breath sounds normal. No respiratory distress. She has no wheezes. She has no rales.  Abdominal: Soft. Bowel sounds are normal. She exhibits no distension and no mass. There is tenderness ( Mid abdominal tenderness, no guarding, no peritoneal signs).  Urostomy just right of midline - bowel sounds are normal  Musculoskeletal: Normal range of motion. She exhibits edema ( Right lower extremity edema). She exhibits no tenderness.  Lymphadenopathy:    She has no cervical adenopathy.  Neurological: She is alert. Coordination normal.  Skin: Skin is warm and dry. No rash noted. No erythema.  Psychiatric: She has a normal mood and affect. Her behavior is normal.  Nursing note and vitals reviewed.    ED Treatments / Results  Labs (all labs ordered are listed, but only abnormal results are displayed) Labs Reviewed  COMPREHENSIVE METABOLIC PANEL - Abnormal; Notable for the following components:      Result Value   Potassium 3.3 (*)    CO2 20 (*)    Glucose, Bld 144 (*)    Calcium 7.8 (*)    Albumin 3.4 (*)    GFR calc non Af Amer 58 (*)    All other components within normal limits  CBC - Abnormal; Notable for the following components:   Hemoglobin 11.6 (*)    HCT 35.3 (*)    MCV 76.9 (*)    MCH 25.3 (*)    RDW 16.2 (*)    All other components within normal limits  URINALYSIS, ROUTINE W REFLEX MICROSCOPIC - Abnormal; Notable for the following components:   APPearance HAZY (*)    Hgb urine dipstick MODERATE (*)    Protein, ur 30 (*)    Nitrite POSITIVE (*)    Leukocytes, UA MODERATE (*)    Bacteria, UA FEW (*)    Squamous Epithelial / LPF 0-5 (*)    All other components within normal limits  URINE CULTURE  LIPASE, BLOOD    EKG None  Radiology Dg Abd Acute  W/chest  Result Date: 10/13/2017 CLINICAL DATA:  Lower abdominal pain EXAM: DG ABDOMEN ACUTE W/ 1V CHEST COMPARISON:  None. FINDINGS: The lungs are clear without focal pneumonia,  edema, pneumothorax or pleural effusion. Interstitial markings are diffusely coarsened with chronic features. The cardiopericardial silhouette is within normal limits for size. The visualized bony structures of the thorax are intact. Upright film shows no evidence for intraperitoneal free air. There is no evidence for gaseous bowel dilation to suggest obstruction. Calcification overlying the right L2 transverse process may reflect a renal stone which projects in the expected region of the right renal pelvis. Surgical clips overlie the anatomic pelvis. Degenerative changes noted lower lumbar spine. IMPRESSION: 1. No acute cardiopulmonary findings. 2. Nonspecific bowel gas pattern. 3. Possible stone in the region of the right renal pelvis. Electronically Signed   By: Misty Stanley M.D.   On: 10/13/2017 18:22    Procedures Procedures (including critical care time)  Medications Ordered in ED Medications  ciprofloxacin (CIPRO) tablet 500 mg (has no administration in time range)  HYDROcodone-acetaminophen (NORCO/VICODIN) 5-325 MG per tablet 2 tablet (2 tablets Oral Given 10/13/17 1821)     Initial Impression / Assessment and Plan / ED Course  I have reviewed the triage vital signs and the nursing notes.  Pertinent labs & imaging results that were available during my care of the patient were reviewed by me and considered in my medical decision making (see chart for details).  Clinical Course as of Oct 14 1835  Mon Oct 13, 2017  8502 The patient's abdominal imaging shows no signs of bowel obstruction.  Her labs show no leukocytosis, normal lipase and metabolic panel is overall unremarkable however her urinalysis is definitely consistent with a urinary tract infection.  Will start antibiotic therapy, the patient is otherwise stable for discharge Urinary culture ordered   [BM]    Clinical Course User Index [BM] Noemi Chapel, MD   The etiology of the patient's abdominal pain is not clear, she is not  vomiting to suggest an obstruction and does not appear distended however she does have mid abdominal tenderness with multiple prior surgeries and is at increased risk for complications from same.  Labs are unremarkable with no leukocytosis, mild anemia, slightly low potassium and calcium although none of these explain the patient's symptoms.  We will take urine from her urostomy bag and culture it as well.  Acute abdominal series, the patient does not have a surgical abdomen at this time.   Vitals:   10/13/17 1418 10/13/17 1809 10/13/17 1810 10/13/17 1811  BP: (!) 153/93 (!) 147/84    Pulse: 71  (!) 53 (!) 59  Resp: 16     Temp: 98.4 F (36.9 C)     TempSrc: Oral     SpO2: 97%  99% 98%  Weight:      Height:          Final Clinical Impressions(s) / ED Diagnoses   Final diagnoses:  Urinary tract infection without hematuria, site unspecified    ED Discharge Orders        Ordered    ciprofloxacin (CIPRO) 500 MG tablet  Every 12 hours     10/13/17 1836    ibuprofen (ADVIL,MOTRIN) 600 MG tablet  Every 6 hours PRN     10/13/17 1836       Noemi Chapel, MD 10/13/17 Bosie Helper

## 2017-10-13 NOTE — ED Triage Notes (Signed)
Pt states abd pain intermittently for one week. Pt states chronic diarrhea. Pt denies vomiting, slight nausea.

## 2017-10-13 NOTE — Discharge Instructions (Signed)
Your testing today shows that you have a urinary tract infection as the likely cause of your pain Please take Cipro, 500 mg by mouth twice a day for the next 7 days Please follow-up with your family doctor within 2-3 days for a recheck Emergency department for increasing pain vomiting or fevers

## 2017-10-13 NOTE — Telephone Encounter (Signed)
Patient noted on provider schedule for ABD pain.   Call placed to patient to inquire.   Reports that she has been having moderate to severe lower abd pain x1 week. Advised that with past issues with obstruction, patient needs to be seen at ER for evaluation. Verbalized understanding.

## 2017-10-17 LAB — URINE CULTURE

## 2017-10-19 ENCOUNTER — Telehealth: Payer: Self-pay

## 2017-10-19 NOTE — Progress Notes (Signed)
ED Antimicrobial Stewardship Positive Culture Follow Up   [name] is an 67 y.o. female who presented to [facility] on 10/13/2017 with a chief complaint of  Chief Complaint  Patient presents with  . Abdominal Pain    Recent Results (from the past 720 hour(s))  Urine Culture     Status: Abnormal   Collection Time: 10/13/17  5:50 PM  Result Value Ref Range Status   Specimen Description   Final    URINE, CLEAN CATCH Performed at [facility], [address], [place], [place] [address]    Special Requests   Final    NONE Performed at [facility], [address], [place], [place] [address]    Culture (A)  Final    >=100,000 COLONIES/mL ESCHERICHIA COLI Confirmed Extended Spectrum Beta-Lactamase Producer (ESBL).  In bloodstream infections from ESBL organisms, carbapenems are preferred over piperacillin/tazobactam. They are shown to have a lower risk of mortality. Performed at [facility], [place] [address], [place], [place] [address]    Report Status 10/17/2017 FINAL  Final   Organism ID, Bacteria ESCHERICHIA COLI (A)  Final      Susceptibility   Escherichia coli - MIC*    AMPICILLIN >=32 RESISTANT Resistant     CEFAZOLIN >=64 RESISTANT Resistant     CEFTRIAXONE >=64 RESISTANT Resistant     CIPROFLOXACIN >=4 RESISTANT Resistant     GENTAMICIN >=16 RESISTANT Resistant     IMIPENEM <=0.25 SENSITIVE Sensitive     NITROFURANTOIN 128 RESISTANT Resistant     TRIMETH/SULFA <=20 SENSITIVE Sensitive     AMPICILLIN/SULBACTAM >=32 RESISTANT Resistant     PIP/TAZO 8 SENSITIVE Sensitive     Extended ESBL POSITIVE Resistant     * >=100,000 COLONIES/mL ESCHERICHIA COLI    [x]  Treated with Cipro, organism resistant to prescribed antimicrobial []  Patient discharged originally without antimicrobial agent and treatment is now indicated  New antibiotic prescription: Follow-up for symptom resolution, if no better or symptoms are progressing the patient will need to be treated with IV  antibiotics.   ED Provider: Arlean Hopping PA-C   [place] 10/19/2017, 12:59 PM

## 2017-10-19 NOTE — Telephone Encounter (Signed)
Post ED Visit - Positive Culture Follow-up: Unsuccessful Patient Follow-up  Culture assessed and recommendations reviewed by:  []  Elenor Quinones, Pharm.D. []  Heide Guile, Pharm.D., BCPS AQ-ID []  Parks Neptune, Pharm.D., BCPS []  Alycia Rossetti, Pharm.D., BCPS []  [place], [place].D., BCPS, AAHIVP []  Legrand Como, Pharm.D., BCPS, AAHIVP []  Salome Arnt, PharmD, BCPS []  Dimitri Ped, PharmD, BCPS []  Vincenza Hews, PharmD, BCPS Georga Bora Pharm D Symptom check  From ED 10/13/17  May need IV therapy Positive urine culture  []  Patient discharged without antimicrobial prescription and treatment is now indicated []  Organism is resistant to prescribed ED discharge antimicrobial []  Patient with positive blood cultures   Unable to contact patient after 3 attempts, letter will be sent to address on file  Genia Del 10/19/2017, 2:19 PM

## 2017-10-22 DIAGNOSIS — M7989 Other specified soft tissue disorders: Secondary | ICD-10-CM | POA: Diagnosis not present

## 2017-10-22 DIAGNOSIS — Z86718 Personal history of other venous thrombosis and embolism: Secondary | ICD-10-CM | POA: Diagnosis not present

## 2017-10-22 DIAGNOSIS — M79601 Pain in right arm: Secondary | ICD-10-CM | POA: Diagnosis not present

## 2017-10-22 DIAGNOSIS — M778 Other enthesopathies, not elsewhere classified: Secondary | ICD-10-CM | POA: Diagnosis not present

## 2017-10-22 DIAGNOSIS — I158 Other secondary hypertension: Secondary | ICD-10-CM | POA: Diagnosis not present

## 2017-10-22 DIAGNOSIS — M779 Enthesopathy, unspecified: Secondary | ICD-10-CM | POA: Diagnosis not present

## 2017-10-22 DIAGNOSIS — Z87891 Personal history of nicotine dependence: Secondary | ICD-10-CM | POA: Diagnosis not present

## 2017-10-22 DIAGNOSIS — I10 Essential (primary) hypertension: Secondary | ICD-10-CM | POA: Diagnosis not present

## 2017-10-22 DIAGNOSIS — Z79899 Other long term (current) drug therapy: Secondary | ICD-10-CM | POA: Diagnosis not present

## 2017-10-22 DIAGNOSIS — S6991XA Unspecified injury of right wrist, hand and finger(s), initial encounter: Secondary | ICD-10-CM | POA: Diagnosis not present

## 2017-10-22 DIAGNOSIS — E7800 Pure hypercholesterolemia, unspecified: Secondary | ICD-10-CM | POA: Diagnosis not present

## 2017-10-22 DIAGNOSIS — M65842 Other synovitis and tenosynovitis, left hand: Secondary | ICD-10-CM | POA: Diagnosis not present

## 2017-10-22 DIAGNOSIS — Z8551 Personal history of malignant neoplasm of bladder: Secondary | ICD-10-CM | POA: Diagnosis not present

## 2017-10-22 DIAGNOSIS — M25531 Pain in right wrist: Secondary | ICD-10-CM | POA: Diagnosis not present

## 2017-10-28 ENCOUNTER — Encounter: Payer: Self-pay | Admitting: *Deleted

## 2017-10-28 ENCOUNTER — Other Ambulatory Visit: Payer: Self-pay

## 2017-10-28 ENCOUNTER — Encounter: Payer: Self-pay | Admitting: Family Medicine

## 2017-10-28 ENCOUNTER — Ambulatory Visit (INDEPENDENT_AMBULATORY_CARE_PROVIDER_SITE_OTHER): Payer: Medicare Other | Admitting: Family Medicine

## 2017-10-28 VITALS — BP 128/68 | HR 72 | Temp 98.1°F | Resp 14 | Ht 66.0 in | Wt 181.0 lb

## 2017-10-28 DIAGNOSIS — M778 Other enthesopathies, not elsewhere classified: Secondary | ICD-10-CM | POA: Diagnosis not present

## 2017-10-28 MED ORDER — MORPHINE SULFATE ER 15 MG PO TBCR: 15.0000 mg | EXTENDED_RELEASE_TABLET | Freq: Two times a day (BID) | ORAL | 0 refills | Status: DC

## 2017-10-28 MED ORDER — OXYCODONE HCL 5 MG PO TABS: 5.0000 mg | ORAL_TABLET | Freq: Four times a day (QID) | ORAL | 0 refills | Status: DC | PRN

## 2017-10-28 NOTE — Progress Notes (Signed)
   Subjective:    Patient ID: [name], female    DOB: 1950-08-28, 67 y.o.   MRN: [id number]  Patient presents for ER F/U [facility]- R arm pain- dx: tendonitis)  Pt here for ER follow up, last week EMS called due to right arm/hand swelling, she had mowed the grass the day before but had progressive pain and swelling. No fall or injury to arm. ER no fracture to arm, dx tendonitis, placed in brace and sling. She been icing, using begay and flexeril which has helped.  IN ER given morphine, Phenergan, ativan Given 5 tablets of morphine at discharge  Swelling has improved but still has a lot if pain at wrist that radiates up arm   Bowels are back on track eating soft foods   Review Of Systems:  GEN- denies fatigue, fever, weight loss,weakness, recent illness HEENT- denies eye drainage, change in vision, nasal discharge, CVS- denies chest pain, palpitations RESP- denies SOB, cough, wheeze ABD- denies N/V, change in stools, abd pain GU- denies dysuria, hematuria, dribbling, incontinence MSK- + joint pain, muscle aches, injury Neuro- denies headache, dizziness, syncope, seizure activity       Objective:    BP 128/68   Pulse 72   Temp 98.1 F (36.7 C) (Oral)   Resp 14   Ht 5\' 6"  (1.676 m)   Wt 181 lb (82.1 kg)   SpO2 97%   BMI 29.21 kg/m  GEN- NAD, alert and oriented x3 CVS- RRR, no murmur RESP-CTAB MSK- Right arm swelling of hand, wrist to elbow, no erythema, decreased ROM of wrist, FROM at elbow, TTP across wrist and hand. Able to grasp, able to make fist Pulse- radial 2+        Assessment & Plan:      Problem List Items Addressed This Visit    None    Visit Diagnoses    Tendinitis of right wrist    -  Primary   Only low dose ibuprofen 400mg  daily due to eliquis, ICE, topicals brace, referral to ortho, chronic painmeds refilled   Relevant Orders   Ambulatory referral to Orthopedic Surgery      Note: This dictation was prepared with Dragon dictation  along with smaller phrase technology. Any transcriptional errors that result from this process are unintentional.

## 2017-10-28 NOTE — Patient Instructions (Addendum)
Referral to orthopedics - [place]  Ice, use topical Ibuprofen 400mg  only once a day if severe  Medication F/U as previous

## 2017-11-01 ENCOUNTER — Other Ambulatory Visit: Payer: Self-pay | Admitting: Family Medicine

## 2017-11-03 DIAGNOSIS — Z936 Other artificial openings of urinary tract status: Secondary | ICD-10-CM | POA: Diagnosis not present

## 2017-11-03 DIAGNOSIS — C679 Malignant neoplasm of bladder, unspecified: Secondary | ICD-10-CM | POA: Diagnosis not present

## 2017-12-03 ENCOUNTER — Ambulatory Visit: Payer: Medicare Other | Admitting: Family Medicine

## 2017-12-03 ENCOUNTER — Encounter: Payer: Self-pay | Admitting: Family Medicine

## 2017-12-03 ENCOUNTER — Ambulatory Visit (INDEPENDENT_AMBULATORY_CARE_PROVIDER_SITE_OTHER): Payer: Medicare Other | Admitting: Family Medicine

## 2017-12-03 ENCOUNTER — Other Ambulatory Visit: Payer: Self-pay

## 2017-12-03 VITALS — BP 130/62 | HR 80 | Temp 97.9°F | Resp 16 | Ht 66.0 in | Wt 179.0 lb

## 2017-12-03 DIAGNOSIS — R109 Unspecified abdominal pain: Secondary | ICD-10-CM

## 2017-12-03 DIAGNOSIS — R600 Localized edema: Secondary | ICD-10-CM

## 2017-12-03 DIAGNOSIS — G8929 Other chronic pain: Secondary | ICD-10-CM | POA: Diagnosis not present

## 2017-12-03 DIAGNOSIS — R609 Edema, unspecified: Secondary | ICD-10-CM

## 2017-12-03 DIAGNOSIS — I10 Essential (primary) hypertension: Secondary | ICD-10-CM

## 2017-12-03 MED ORDER — OXYCODONE HCL 5 MG PO TABS: 5.0000 mg | ORAL_TABLET | Freq: Four times a day (QID) | ORAL | 0 refills | Status: DC | PRN

## 2017-12-03 MED ORDER — MORPHINE SULFATE ER 15 MG PO TBCR: 15.0000 mg | EXTENDED_RELEASE_TABLET | Freq: Two times a day (BID) | ORAL | 0 refills | Status: DC

## 2017-12-03 NOTE — Patient Instructions (Addendum)
Wear compression hose during day, remove at night Take lasix  Continue all other medications Blood pressure looks better! F/U July for PHYSICAL- FASTING

## 2017-12-03 NOTE — Progress Notes (Signed)
   Subjective:    Patient ID: [name], female    DOB: [birth year], 67 y.o.   MRN: [id number]  Patient presents for Follow-up (is not fasting)    Pt here for f/u on chronic medical problems      She has been caring for grand-daughter, states tendinitis has improved has ortho appt on the 19th  She had a fall a few weeks ago, was caring her grandbaby and tripped ,skinned up right elbow but no other injury    HTN- taking BP meds as prescribed, much improved , now taking lasix daily,    Peripheral- eating more sodium/ drinks soda/gaterade all the time    Chronic pain- now taking the MS Contin twice a a day as prescribed, now using twice a day Oxycoodone   Review Of Systems:  GEN- denies fatigue, fever, weight loss,weakness, recent illness HEENT- denies eye drainage, change in vision, nasal discharge, CVS- denies chest pain, palpitations RESP- denies SOB, cough, wheeze ABD- denies N/V, change in stools, abd pain GU- denies dysuria, hematuria, dribbling, incontinence MSK- + joint pain, muscle aches, injury Neuro- denies headache, dizziness, syncope, seizure activity       Objective:    BP 130/62   Pulse 80   Temp 97.9 F (36.6 C) (Oral)   Resp 16   Ht 5\' 6"  (1.676 m)   Wt 179 lb (81.2 kg)   SpO2 97%   BMI 28.89 kg/m  GEN- NAD, alert and oriented x3 HEENT- PERRL, EOMI, non injected sclera, pink conjunctiva, MMM, oropharynx clear Neck- Supple, no thyromegaly CVS- RRR, no murmur RESP-CTAB ABD-NABS,soft,NT,ND, urostomy bag in tact EXT- trace pedal edema Pulses- Radial  2+        Assessment & Plan:      Problem List Items Addressed This Visit      Unprioritized   Chronic abdominal pain    Continue MS Contin, Oxycodone use will hopefully continue to decrease      Relevant Medications   morphine (MS CONTIN) 15 MG 12 hr tablet   oxyCODONE (OXY IR/ROXICODONE) 5 MG immediate release tablet   Essential hypertension - Primary    Improved, no changes       Peripheral edema    Continue lasix, use compression hose, shehas not been doing so         Note: This dictation was prepared with Dragon dictation along with smaller phrase technology. Any transcriptional errors that result from this process are unintentional.

## 2017-12-04 ENCOUNTER — Encounter: Payer: Self-pay | Admitting: Family Medicine

## 2017-12-04 NOTE — Assessment & Plan Note (Signed)
Improved, no changes

## 2017-12-04 NOTE — Assessment & Plan Note (Signed)
Continue MS Contin, Oxycodone use will hopefully continue to decrease

## 2017-12-04 NOTE — Assessment & Plan Note (Signed)
Continue lasix, use compression hose, shehas not been doing so

## 2017-12-06 DIAGNOSIS — Z936 Other artificial openings of urinary tract status: Secondary | ICD-10-CM | POA: Diagnosis not present

## 2017-12-06 DIAGNOSIS — C679 Malignant neoplasm of bladder, unspecified: Secondary | ICD-10-CM | POA: Diagnosis not present

## 2017-12-16 ENCOUNTER — Other Ambulatory Visit: Payer: Self-pay | Admitting: Family Medicine

## 2017-12-24 DIAGNOSIS — M25531 Pain in right wrist: Secondary | ICD-10-CM | POA: Diagnosis not present

## 2017-12-30 ENCOUNTER — Other Ambulatory Visit: Payer: Self-pay | Admitting: *Deleted

## 2017-12-30 MED ORDER — MORPHINE SULFATE ER 15 MG PO TBCR: 15.0000 mg | EXTENDED_RELEASE_TABLET | Freq: Two times a day (BID) | ORAL | 0 refills | Status: DC

## 2017-12-30 MED ORDER — OXYCODONE HCL 5 MG PO TABS: 5.0000 mg | ORAL_TABLET | Freq: Four times a day (QID) | ORAL | 0 refills | Status: DC | PRN

## 2017-12-30 NOTE — Telephone Encounter (Signed)
Received call from patient.   Requested refills on MS Contin and Oxycodone.   Ok to refill??  Last office visit/ refill 12/03/2017.

## 2018-01-06 DIAGNOSIS — Z936 Other artificial openings of urinary tract status: Secondary | ICD-10-CM | POA: Diagnosis not present

## 2018-01-06 DIAGNOSIS — C679 Malignant neoplasm of bladder, unspecified: Secondary | ICD-10-CM | POA: Diagnosis not present

## 2018-01-16 ENCOUNTER — Other Ambulatory Visit: Payer: Self-pay | Admitting: Family Medicine

## 2018-01-27 ENCOUNTER — Other Ambulatory Visit: Payer: Self-pay | Admitting: Family Medicine

## 2018-01-28 ENCOUNTER — Ambulatory Visit (INDEPENDENT_AMBULATORY_CARE_PROVIDER_SITE_OTHER): Payer: Medicare Other | Admitting: Family Medicine

## 2018-01-28 ENCOUNTER — Encounter: Payer: Self-pay | Admitting: Family Medicine

## 2018-01-28 ENCOUNTER — Other Ambulatory Visit: Payer: Self-pay

## 2018-01-28 VITALS — BP 130/62 | HR 82 | Temp 97.6°F | Resp 14 | Ht 66.0 in | Wt 180.0 lb

## 2018-01-28 DIAGNOSIS — Z8551 Personal history of malignant neoplasm of bladder: Secondary | ICD-10-CM | POA: Diagnosis not present

## 2018-01-28 DIAGNOSIS — R197 Diarrhea, unspecified: Secondary | ICD-10-CM

## 2018-01-28 DIAGNOSIS — Z1231 Encounter for screening mammogram for malignant neoplasm of breast: Secondary | ICD-10-CM

## 2018-01-28 DIAGNOSIS — Z1239 Encounter for other screening for malignant neoplasm of breast: Secondary | ICD-10-CM

## 2018-01-28 DIAGNOSIS — Z1159 Encounter for screening for other viral diseases: Secondary | ICD-10-CM | POA: Diagnosis not present

## 2018-01-28 DIAGNOSIS — K6289 Other specified diseases of anus and rectum: Secondary | ICD-10-CM

## 2018-01-28 DIAGNOSIS — I10 Essential (primary) hypertension: Secondary | ICD-10-CM

## 2018-01-28 DIAGNOSIS — Z0000 Encounter for general adult medical examination without abnormal findings: Secondary | ICD-10-CM

## 2018-01-28 DIAGNOSIS — E038 Other specified hypothyroidism: Secondary | ICD-10-CM

## 2018-01-28 DIAGNOSIS — Z780 Asymptomatic menopausal state: Secondary | ICD-10-CM

## 2018-01-28 DIAGNOSIS — E7800 Pure hypercholesterolemia, unspecified: Secondary | ICD-10-CM | POA: Diagnosis not present

## 2018-01-28 DIAGNOSIS — G8929 Other chronic pain: Secondary | ICD-10-CM

## 2018-01-28 MED ORDER — MORPHINE SULFATE ER 15 MG PO TBCR: 15.0000 mg | EXTENDED_RELEASE_TABLET | Freq: Two times a day (BID) | ORAL | 0 refills | Status: DC

## 2018-01-28 MED ORDER — OXYCODONE HCL 5 MG PO TABS: 5.0000 mg | ORAL_TABLET | Freq: Four times a day (QID) | ORAL | 0 refills | Status: DC | PRN

## 2018-01-28 NOTE — Progress Notes (Signed)
Subjective:   Patient presents for Medicare Annual/Subsequent preventive examination.   Using compression hose for leg swelling and taking lasix most days which has helped, still eating too much salt    No smoking in 1 year    HTN- taking BP meds no concerns  Chronic diarrhea, rectal,  pain, has seen multiple specialist, GI and colorectal locally at [facility], [place]  on chronic pain medication Wants to be referred back to [facility]    Review Past Medical/Family/Social: Per EMR    Risk Factors  Current exercise habits: walks  Dietary issues discussed:   Cardiac risk factors: h/o smoking, HTN, Hyperlipidemia  Depression Screen  (Note: if answer to either of the following is "Yes", a more complete depression screening is indicated)  Over the past two weeks, have you felt down, depressed or hopeless? No Over the past two weeks, have you felt little interest or pleasure in doing things? No Have you lost interest or pleasure in daily life? No Do you often feel hopeless? No Do you cry easily over simple problems? No   Activities of Daily Living  In your present state of health, do you have any difficulty performing the following activities?:  Driving? No  Managing money? No  Feeding yourself? No  Getting from bed to chair? No  Climbing a flight of stairs? No  Preparing food and eating?: No  Bathing or showering? No  Getting dressed: No  Getting to the toilet? No  Using the toilet:No  Moving around from place to place: No  In the past year have you fallen or had a near fall?:No  Are you sexually active? No  Do you have more than one partner? No   Hearing Difficulties: Yes but unchanged has been this way for years Do you often ask people to speak up or repeat themselves?no  Do you experience ringing or noises in your ears? Yes Do you have difficulty understanding soft or whispered voices? yes Do you feel that you have a problem with memory? Yes Do you often misplace  items? No  Do you feel safe at home? Yes  Cognitive Testing  Alert? Yes Normal Appearance?Yes  Oriented to person? Yes Place? Yes  Time? Yes  Recall of three objects? Yes  Can perform simple calculations? Yes  Displays appropriate judgment?Yes  Can read the correct time from a watch face?Yes   List the Names of Other Physician/Practitioners you currently use:     Screening Tests / Date Colonoscopy  UTD                   Mammogram  Due Bone Density- Due Declines immunizations   ROS: GEN- denies fatigue, fever, weight loss,weakness, recent illness HEENT- denies eye drainage, change in vision, nasal discharge, CVS- denies chest pain, palpitations RESP- denies SOB, cough, wheeze ABD- denies N/V, change in stools, abd pain+ chronic diarrhea/pain GU- denies dysuria, hematuria, dribbling, incontinence MSK- denies joint pain, muscle aches, injury Neuro- denies headache, dizziness, syncope, seizure activity  Physical: GEN- NAD, alert and oriented x3 HEENT- PERRL, EOMI, non injected sclera, pink conjunctiva, MMM, oropharynx clear, TM clear bilat no edema Neck- Supple, no thryomegaly, No bruit CVS- RRR, no murmur RESP-CTAB ABD-nabs,soft,NT,ND, Urostomy in tact EXT- pedal edema Pulses- Radial, DP- 2+   Assessment:    Annual wellness medicare exam   Plan:    During the course of the visit the patient was educated and counseled about appropriate screening and preventive services including:  Screening mammography/ Bone Density- pt to schedule    Screen neg for depression/ CAGE neg/ Fall Neg   HTN- no change to meds, controlled  Hypothyroidism continue synthroid  Hep C screening   History of bladder cancer- has urostomy  Chronic diarrhea/rectal- continue pain meds, refilled today, referral back to GI , but honeslty I am not sure what more to do for her, we have had this conversation multiple times, she has been on multiple antispasmodic, anti-diarrheal meds, nothing  works    Diet review for nutrition referral? Yes ____ Not Indicated __x__  Patient Instructions (the written plan) was given to the patient.  Medicare Attestation  I have personally reviewed:  The patient's medical and social history  Their use of alcohol, tobacco or illicit drugs  Their current medications and supplements  The patient's functional ability including ADLs,fall risks, home safety risks, cognitive, and hearing and visual impairment  Diet and physical activities  Evidence for depression or mood disorders  The patient's weight, height, BMI, and visual acuity have been recorded in the chart. I have made referrals, counseling, and provided education to the patient based on review of the above and I have provided the patient with a written personalized care plan for preventive services.

## 2018-01-28 NOTE — Patient Instructions (Addendum)
Referral to [facility]   Schedule your mammogrom Schedule your bone density Call with lab results  F/U 4 months

## 2018-01-29 ENCOUNTER — Encounter: Payer: Self-pay | Admitting: Family Medicine

## 2018-01-29 LAB — COMPREHENSIVE METABOLIC PANEL
AG RATIO: 1.6 (calc) (ref 1.0–2.5)
ALBUMIN MSPROF: 4.1 g/dL (ref 3.6–5.1)
ALKALINE PHOSPHATASE (APISO): 67 U/L (ref 33–130)
ALT: 13 U/L (ref 6–29)
AST: 14 U/L (ref 10–35)
BILIRUBIN TOTAL: 0.9 mg/dL (ref 0.2–1.2)
BUN / CREAT RATIO: 17 (calc) (ref 6–22)
BUN: 18 mg/dL (ref 7–25)
CO2: 26 mmol/L (ref 20–32)
CREATININE: 1.06 mg/dL — AB (ref 0.50–0.99)
Calcium: 8 mg/dL — ABNORMAL LOW (ref 8.6–10.4)
Chloride: 106 mmol/L (ref 98–110)
Globulin: 2.5 g/dL (calc) (ref 1.9–3.7)
Glucose, Bld: 88 mg/dL (ref 65–99)
POTASSIUM: 4.1 mmol/L (ref 3.5–5.3)
SODIUM: 142 mmol/L (ref 135–146)
Total Protein: 6.6 g/dL (ref 6.1–8.1)

## 2018-01-29 LAB — LIPID PANEL
CHOL/HDL RATIO: 2.4 (calc) (ref <5.0)
CHOLESTEROL: 126 mg/dL (ref <200)
HDL: 52 mg/dL (ref >50)
LDL CHOLESTEROL (CALC): 56 mg/dL
NON-HDL CHOLESTEROL (CALC): 74 mg/dL (ref <130)
Triglycerides: 94 mg/dL (ref <150)

## 2018-01-29 LAB — CBC WITH DIFFERENTIAL/PLATELET
Basophils Absolute: 18 cells/uL (ref 0–200)
Basophils Relative: 0.3 %
EOS ABS: 162 {cells}/uL (ref 15–500)
Eosinophils Relative: 2.7 %
HEMATOCRIT: 36.3 % (ref 35.0–45.0)
Hemoglobin: 11.9 g/dL (ref 11.7–15.5)
LYMPHS ABS: 1536 {cells}/uL (ref 850–3900)
MCH: 26.3 pg — AB (ref 27.0–33.0)
MCHC: 32.8 g/dL (ref 32.0–36.0)
MCV: 80.3 fL (ref 80.0–100.0)
MPV: 10.2 fL (ref 7.5–12.5)
Monocytes Relative: 9.7 %
Neutro Abs: 3702 cells/uL (ref 1500–7800)
Neutrophils Relative %: 61.7 %
PLATELETS: 291 10*3/uL (ref 140–400)
RBC: 4.52 10*6/uL (ref 3.80–5.10)
RDW: 15.5 % — ABNORMAL HIGH (ref 11.0–15.0)
TOTAL LYMPHOCYTE: 25.6 %
WBC: 6 10*3/uL (ref 3.8–10.8)
WBCMIX: 582 {cells}/uL (ref 200–950)

## 2018-01-29 LAB — HEPATITIS C ANTIBODY
Hepatitis C Ab: NONREACTIVE
SIGNAL TO CUT-OFF: 0.02 (ref <1.00)

## 2018-01-29 LAB — TSH: TSH: 1.62 m[IU]/L (ref 0.40–4.50)

## 2018-01-29 LAB — T3, FREE: T3, Free: 2.6 pg/mL (ref 2.3–4.2)

## 2018-01-29 LAB — T4, FREE: Free T4: 1.4 ng/dL (ref 0.8–1.8)

## 2018-01-30 ENCOUNTER — Encounter: Payer: Self-pay | Admitting: *Deleted

## 2018-02-09 ENCOUNTER — Other Ambulatory Visit: Payer: Self-pay | Admitting: Family Medicine

## 2018-02-09 DIAGNOSIS — C679 Malignant neoplasm of bladder, unspecified: Secondary | ICD-10-CM | POA: Diagnosis not present

## 2018-02-09 DIAGNOSIS — Z936 Other artificial openings of urinary tract status: Secondary | ICD-10-CM | POA: Diagnosis not present

## 2018-02-12 ENCOUNTER — Other Ambulatory Visit: Payer: Self-pay

## 2018-02-12 ENCOUNTER — Ambulatory Visit (INDEPENDENT_AMBULATORY_CARE_PROVIDER_SITE_OTHER): Payer: Medicare Other | Admitting: Family Medicine

## 2018-02-12 ENCOUNTER — Encounter: Payer: Self-pay | Admitting: Family Medicine

## 2018-02-12 VITALS — BP 132/62 | HR 100 | Temp 98.6°F | Resp 14 | Ht 66.0 in | Wt 176.0 lb

## 2018-02-12 DIAGNOSIS — N390 Urinary tract infection, site not specified: Secondary | ICD-10-CM

## 2018-02-12 DIAGNOSIS — R319 Hematuria, unspecified: Secondary | ICD-10-CM | POA: Diagnosis not present

## 2018-02-12 DIAGNOSIS — R300 Dysuria: Secondary | ICD-10-CM | POA: Diagnosis not present

## 2018-02-12 LAB — URINALYSIS, ROUTINE W REFLEX MICROSCOPIC
BILIRUBIN URINE: NEGATIVE
Glucose, UA: NEGATIVE
KETONES UR: NEGATIVE
NITRITE: NEGATIVE
SPECIFIC GRAVITY, URINE: 1.02 (ref 1.001–1.03)
pH: 6 (ref 5.0–8.0)

## 2018-02-12 LAB — MICROSCOPIC MESSAGE

## 2018-02-12 MED ORDER — FOSFOMYCIN TROMETHAMINE 3 G PO PACK: 3.0000 g | PACK | ORAL | 0 refills | Status: AC

## 2018-02-12 MED ORDER — CEPHALEXIN 500 MG PO CAPS: 500.0000 mg | ORAL_CAPSULE | Freq: Four times a day (QID) | ORAL | 0 refills | Status: AC

## 2018-02-12 NOTE — Patient Instructions (Signed)
Will try and start treating with Keflex antibiotic, but there may be resistance.  We will call you with the culture report.  I have printed the back up antibiotic prescription in case you need to get it when you are out of town.   There is a change that you will still need to go to the hospital for treatment of the infection because the bacteria you had in the past was resistant to multiple kinds of antibiotics and you have multiple drug allergies.    You need to follow up with your urologist when you get back in town.     Urinary Tract Infection, Adult A urinary tract infection (UTI) is an infection of any part of the urinary tract. The urinary tract includes the:  Kidneys.  Ureters.  Bladder.  Urethra.  These organs make, store, and get rid of pee (urine) in the body. Follow these instructions at home:  Take over-the-counter and prescription medicines only as told by your doctor.  If you were prescribed an antibiotic medicine, take it as told by your doctor. Do not stop taking the antibiotic even if you start to feel better.  Avoid the following drinks: ? Alcohol. ? Caffeine. ? Tea. ? Carbonated drinks.  Drink enough fluid to keep your pee clear or pale yellow.  Keep all follow-up visits as told by your doctor. This is important.  Make sure to: ? Empty your bladder often and completely. Do not to hold pee for long periods of time. ? Empty your bladder before and after sex. ? Wipe from front to back after a bowel movement if you are female. Use each tissue one time when you wipe. Contact a doctor if:  You have back pain.  You have a fever.  You feel sick to your stomach (nauseous).  You throw up (vomit).  Your symptoms do not get better after 3 days.  Your symptoms go away and then come back. Get help right away if:  You have very bad back pain.  You have very bad lower belly (abdominal) pain.  You are throwing up and cannot keep down any medicines or  water. This information is not intended to replace advice given to you by your health care provider. Make sure you discuss any questions you have with your health care provider. Document Released: 12/04/2007 Document Revised: 11/23/2015 Document Reviewed: 05/08/2015 Elsevier Interactive Patient Education  [employer].

## 2018-02-12 NOTE — Progress Notes (Signed)
Patient ID: [name], female    DOB: 1950-07-30, 66 y.o.   MRN: [id number]  PCP: Alycia Rossetti, MD  Chief Complaint  Patient presents with  . Dysuria    x1 week- malodorous uine, fatigue  . Medication Management    lost MS Contin- thinks she may have accidentally thre them out    Subjective:   [name] is a 67 y.o. female, presents to clinic with CC of suspected UTI Patient is a 67 year old female history of urostomy and recurrent UTIs, presents with concerns of at least 7 days of foul odor to urine with darker color associated with feeling "worn out."  She denies any abdominal pain, nausea, vomiting, sweats, chills, change to appetite or energy.  She has chronic diarrhea no change to her bowels.  In the past she has had recurrent UTIs and she reports many hospitalizations because of it.  She is going out of town to [place] to visit her son and she does not want to "wait for it to get bad" or be hospitalized in [place].  Of note and per chart review she has had recurrent positive urine cultures showing E. coli with multiple drug resistance, she also has multiple drug allergies.  This is thought to be chronic colonization in the past.  She has been urged to go to her urologist for this multiple times in the past.  She states that she changed her urostomy bag prior to coming in the office today and there is a clean sample of urine without any old collection or old urostomy bag.  Urinary Tract Infection   This is a recurrent problem. The current episode started in the past 7 days. The problem occurs every urination. The problem has been unchanged. The pain is at a severity of 0/10. The patient is experiencing no pain. There has been no fever. She is not sexually active. There is no history of pyelonephritis. Pertinent negatives include no chills, discharge, flank pain, frequency, hematuria, hesitancy, nausea, possible pregnancy, sweats, urgency or vomiting. She has  tried nothing for the symptoms. Her past medical history is significant for recurrent UTIs and a urological procedure (urostomy ).   Patient also briefly mentions that she is on oxycodone and MS contin, she is just had her medicines refilled and apparently they deliver him to her house but she currently cannot find her MS Contin.  She has checked with her pharmacy and they did confirm delivering it to her.  She has searched for it everywhere she can in her house but cannot find it.  She is not asking for refill on her chronic pain medicine but is wondering if she will be okay without it while in [place].  Patient Active Problem List   Diagnosis Date Noted  . Peripheral edema 05/27/2016  . Chronic rectal pain 06/02/2015  . Rectal ulcer 11/08/2014  . GERD (gastroesophageal reflux disease) 11/08/2014  . Fistula 06/27/2014  . Protein-calorie malnutrition ([place]) 06/27/2014  . Parotid mass 06/27/2014  . BV (bacterial vaginosis) 04/05/2014  . Lung nodule 04/05/2014  . Loss of weight 02/09/2014  . Grief reaction 01/28/2014  . Leg swelling 01/14/2014  . Lung infiltrate on CT 01/14/2014  . DVT (deep venous thrombosis) ([place]) 12/17/2013  . Partial small bowel obstruction ([place]) 07/21/2013  . Chronic abdominal pain 09/23/2012  . Microcytic anemia 08/26/2012  . Encounter for screening colonoscopy 05/26/2012  . Occlusion of peripherally inserted central catheter (PICC) line ([place]) 02/27/2012  . Post-menopausal  bleeding 12/19/2011  . Recurrent UTI 12/05/2011  . Diarrhea 12/05/2011  . SBO (small bowel obstruction) ([place]) 10/02/2011  . Fatty liver 10/02/2011  . Elevated LFTs 09/03/2011  . Hx of bladder cancer 09/03/2011  . Bowel obstruction ([place]) 09/03/2011  . On total parenteral nutrition (TPN) 09/03/2011  . Hyperlipidemia 01/23/2007  . Hypothyroidism 07/21/2006  . Essential hypertension 07/21/2006  . ARTHRITIS 07/21/2006     Prior to Admission medications   Medication Sig Start Date End Date  Taking? Authorizing Provider  DEXILANT 60 MG capsule TAKE ONE CAPSULE BY MOUTH EVERY DAY 02/09/18  Yes [place], Modena Nunnery, MD  ELIQUIS 5 MG TABS tablet TAKE ONE TABLET BY MOUTH TWICE DAILY 07/21/17  Yes [place], Modena Nunnery, MD  furosemide (LASIX) 40 MG tablet TAKE ONE TABLET BY MOUTH TWICE DAILY Patient taking differently: TAKE ONE TABLET BY MOUTH TWICE DAILY AS NEEDED FOR FLUID 06/27/17  Yes [place], Modena Nunnery, MD  hydrocortisone 2.5 % cream APPLY RECTALLY TWICE DAILY AS NEEDED 10/03/17  Yes [place], Modena Nunnery, MD  ibuprofen (ADVIL,MOTRIN) 600 MG tablet Take 1 tablet (600 mg total) by mouth every 6 (six) hours as needed. 10/13/17  Yes Noemi [place], MD  levothyroxine (SYNTHROID, LEVOTHROID) 125 MCG tablet Take 1 tablet (125 mcg total) by mouth daily. 06/05/17  Yes [place], Modena Nunnery, MD  lidocaine (XYLOCAINE) 5 % ointment APPLY AS DIRECTED AS NEEDED. 01/27/18  Yes [place], Modena Nunnery, MD  magnesium oxide (MAG-OX) 400 MG tablet TAKE TWO (2) TABLETS BY MOUTH TWICE DAILY 11/03/17  Yes [place], Modena Nunnery, MD  Magnesium Oxide 400 (240 Mg) MG TABS Take 1 tablet daily Patient taking differently: Take 1 tablet by mouth daily.  10/03/17  Yes [place], Modena Nunnery, MD  morphine (MS CONTIN) 15 MG 12 hr tablet Take 1 tablet (15 mg total) by mouth every 12 (twelve) hours. 01/28/18  Yes [place], Modena Nunnery, MD  Multiple Vitamins-Minerals (MULTIVITAMIN PO) Take 1 tablet by mouth daily.   Yes [provider]  ondansetron (ZOFRAN-ODT) 8 MG disintegrating tablet TAKE ONE TABLET BY MOUTH EVERY 8 HOURS AS NEEDED FOR NAUSEA 05/29/16  Yes [place], Modena Nunnery, MD  oxyCODONE (OXY IR/ROXICODONE) 5 MG immediate release tablet Take 1 tablet (5 mg total) by mouth every 6 (six) hours as needed for severe pain. 01/28/18  Yes [place], Modena Nunnery, MD  potassium chloride SA (K-DUR,KLOR-CON) 20 MEQ tablet TAKE ONE TABLET BY MOUTH DAILY 09/24/17  Yes [place], Modena Nunnery, MD  pravastatin (PRAVACHOL) 20 MG tablet TAKE ONE TABLET BY MOUTH DAILY FOR CHOLESTEROL  01/16/18  Yes [place], Modena Nunnery, MD     Allergies  Allergen Reactions  . Bactrim [Sulfamethoxazole-Trimethoprim]   . Cefixime Diarrhea  . Codeine Hives  . Lactose Diarrhea  . Nitrofuran Derivatives Itching  . Penicillins Hives    Has patient had a PCN reaction causing immediate rash, facial/tongue/throat swelling, SOB or lightheadedness with hypotension: No Has patient had a PCN reaction causing severe rash involving mucus membranes or skin necrosis: No Has patient had a PCN reaction that required hospitalization: No Has patient had a PCN reaction occurring within the last 10 years: No If all of the above answers are "NO", then may proceed with Cephalosporin use.   . Sulfa Antibiotics Hives           Review of Systems  Constitutional: Negative for appetite change, chills, diaphoresis and fever.  HENT: Negative.   Respiratory: Negative.   Cardiovascular: Negative.   Gastrointestinal: Positive for diarrhea (chronic). Negative for abdominal  distention, abdominal pain, constipation, nausea and vomiting.  Genitourinary: Negative for decreased urine volume, difficulty urinating, dysuria, flank pain, frequency, hematuria, hesitancy, pelvic pain and urgency.  Musculoskeletal: Negative.   Skin: Negative.   Neurological: Negative.   Hematological: Negative.   Psychiatric/Behavioral: Negative.   All other systems reviewed and are negative.      Objective:    Vitals:   02/12/18 0940  BP: 132/62  Pulse: 100  Resp: 14  Temp: 98.6 F (37 C)  TempSrc: Oral  SpO2: 98%  Weight: 176 lb (79.8 kg)  Height: 5\' 6"  (1.676 m)      Physical Exam  Constitutional: She appears well-developed and well-nourished. No distress.  Chronically ill appearing elderly female who appears stated age, NAD, non-toxic  HENT:  Head: Normocephalic and atraumatic.  Nose: Nose normal.  Mouth/Throat: Oropharynx is clear and moist.  Eyes: Pupils are equal, round, and reactive to light. Conjunctivae  are normal. Right eye exhibits no discharge. Left eye exhibits no discharge.  Neck: Normal range of motion. Neck supple. No tracheal deviation present.  Cardiovascular: Normal rate, regular rhythm, normal heart sounds and intact distal pulses. Exam reveals no gallop and no friction rub.  No murmur heard. Pulmonary/Chest: Effort normal and breath sounds normal. No stridor. No respiratory distress. She has no wheezes. She has no rales.  Abdominal: Soft. Bowel sounds are normal. She exhibits no distension and no mass. There is no tenderness. There is no rebound and no guarding.  Urostomy back with mildly cloudy yellow urine, back and adhesive clean.  No tenderness, erythema, edema around site  Musculoskeletal: Normal range of motion.  Neurological: She is alert. She exhibits normal muscle tone. Coordination normal.  Skin: Skin is warm and dry. Capillary refill takes less than 2 seconds. No rash noted. She is not diaphoretic.  Psychiatric: She has a normal mood and affect. Her behavior is normal. Judgment and thought content normal.  Nursing note and vitals reviewed.   Results for orders placed or performed in visit on 02/12/18  Urinalysis, Routine w reflex microscopic  Result Value Ref Range   Color, Urine YELLOW YELLOW   APPearance CLOUDY (A) CLEAR   Specific Gravity, Urine 1.020 1.001 - 1.03   pH 6.0 5.0 - 8.0   Glucose, UA NEGATIVE NEGATIVE   Bilirubin Urine NEGATIVE NEGATIVE   Ketones, ur NEGATIVE NEGATIVE   Hgb urine dipstick 1+ (A) NEGATIVE   Protein, ur 1+ (A) NEGATIVE   Nitrite NEGATIVE NEGATIVE   Leukocytes, UA 2+ (A) NEGATIVE   WBC, UA 20-40 (A) 0 - 5 /HPF   RBC / HPF 3-10 (A) 0 - 2 /HPF   Squamous Epithelial / LPF 0-5 < OR = 5 /HPF   Bacteria, UA MODERATE (A) NONE SEEN /HPF  Microscopic Message  Result Value Ref Range   Note           Assessment & Plan:      ICD-10-CM   1. Dysuria R30.0 Urinalysis, Routine w reflex microscopic    Hx of recurrent UTI/complicated  UTI, chart review with past two urine C&S were pertinent for + E. Coli and multiple drug resistance/ESBL.  Her PCP saw here last in March and April for this after ER visit for abd pain with dx of UTI, PCP suspected some colonolization, and she was instructed to f/up with urology.  Today sx are vague "not feeling well" no pain, but change to urine smell and color, darker and on exam mildly cloudy yellow urine.  Pharmacist  consulted at [facility], reviewed meds and updated her allergies, he does not have Monurol, but suggests levaquin.    Monurol 3g pack x 2 printed for the pt, instructed to take with her out of town.   Pt also instructed to follow up with Urology ASAP to address and manage the UTI's vs colonization.    Reviewed abx on UpToDate and it advises against levaquin as empiric coverage for UTI with multi drug resistant E. Coli.    Will start keflex instead and adjust depending on C&S  Patient also mentions chronic narcotic pain medicine is either lost or missing.  Advised her that she may have some withdrawal symptoms but they may not be severe if she continues to take her other narcotic pain medication.     Delsa Grana, PA-C 02/12/18 9:59 AM

## 2018-02-14 LAB — URINE CULTURE
MICRO NUMBER: 90971205
SPECIMEN QUALITY:: ADEQUATE

## 2018-02-24 ENCOUNTER — Other Ambulatory Visit: Payer: Self-pay | Admitting: *Deleted

## 2018-02-24 MED ORDER — MORPHINE SULFATE ER 15 MG PO TBCR: 15.0000 mg | EXTENDED_RELEASE_TABLET | Freq: Two times a day (BID) | ORAL | 0 refills | Status: DC

## 2018-02-24 MED ORDER — OXYCODONE HCL 5 MG PO TABS: 5.0000 mg | ORAL_TABLET | Freq: Three times a day (TID) | ORAL | 0 refills | Status: DC | PRN

## 2018-02-24 MED ORDER — NALOXONE HCL 0.4 MG/0.4ML IJ SOAJ: INTRAMUSCULAR | 1 refills | Status: DC

## 2018-02-24 NOTE — Telephone Encounter (Signed)
Received call from patient.   Requested refill on MS Contin and Oxycodone.   Ok to refill??  Last office visit 02/12/2018.  Last refill 01/28/2018.

## 2018-02-24 NOTE — Telephone Encounter (Signed)
I will need to decrease her oxycodone to 90 tablets with the new laws with the pain meds to start with , we will continue to continue to decrease slowly, but still control her pain Her unintentional overdose score is high I am sending over a narcan kit as well to have at home in case she has symptoms of overdose

## 2018-02-25 NOTE — Telephone Encounter (Signed)
Call placed to patient and patient made aware.   Patient states that she would prefer to stop the MS Contin as it is not effective in controlling her pain.   MD to be made aware.

## 2018-02-25 NOTE — Telephone Encounter (Signed)
Has she been taking the MS contin regulary? At any rate her oxycodone is still being decreased to 90 tablets Narcan kit still sent to pharmacy

## 2018-03-05 ENCOUNTER — Other Ambulatory Visit: Payer: Self-pay | Admitting: Family Medicine

## 2018-03-19 DIAGNOSIS — C679 Malignant neoplasm of bladder, unspecified: Secondary | ICD-10-CM | POA: Diagnosis not present

## 2018-03-19 DIAGNOSIS — Z936 Other artificial openings of urinary tract status: Secondary | ICD-10-CM | POA: Diagnosis not present

## 2018-03-23 ENCOUNTER — Other Ambulatory Visit: Payer: Self-pay | Admitting: Family Medicine

## 2018-03-23 DIAGNOSIS — E2839 Other primary ovarian failure: Secondary | ICD-10-CM

## 2018-03-31 ENCOUNTER — Other Ambulatory Visit: Payer: Self-pay | Admitting: *Deleted

## 2018-03-31 MED ORDER — MORPHINE SULFATE ER 15 MG PO TBCR: 15.0000 mg | EXTENDED_RELEASE_TABLET | Freq: Two times a day (BID) | ORAL | 0 refills | Status: DC

## 2018-03-31 MED ORDER — OXYCODONE HCL 5 MG PO TABS: 5.0000 mg | ORAL_TABLET | Freq: Three times a day (TID) | ORAL | 0 refills | Status: DC | PRN

## 2018-03-31 NOTE — Telephone Encounter (Signed)
Received call from patient.   Requested refill on MS Contin and Oxycodone.   Ok to refill??  Last office visit 02/12/2018.  Last refill 02/24/2018 on both.

## 2018-04-11 ENCOUNTER — Other Ambulatory Visit: Payer: Self-pay | Admitting: Family Medicine

## 2018-04-16 DIAGNOSIS — K6289 Other specified diseases of anus and rectum: Secondary | ICD-10-CM | POA: Diagnosis not present

## 2018-04-16 DIAGNOSIS — R197 Diarrhea, unspecified: Secondary | ICD-10-CM | POA: Diagnosis not present

## 2018-04-16 DIAGNOSIS — G8929 Other chronic pain: Secondary | ICD-10-CM | POA: Diagnosis not present

## 2018-04-22 DIAGNOSIS — C679 Malignant neoplasm of bladder, unspecified: Secondary | ICD-10-CM | POA: Diagnosis not present

## 2018-04-22 DIAGNOSIS — Z936 Other artificial openings of urinary tract status: Secondary | ICD-10-CM | POA: Diagnosis not present

## 2018-05-04 ENCOUNTER — Other Ambulatory Visit: Payer: Self-pay | Admitting: *Deleted

## 2018-05-04 MED ORDER — MORPHINE SULFATE ER 15 MG PO TBCR: 15.0000 mg | EXTENDED_RELEASE_TABLET | Freq: Two times a day (BID) | ORAL | 0 refills | Status: DC

## 2018-05-04 MED ORDER — OXYCODONE HCL 5 MG PO TABS: 5.0000 mg | ORAL_TABLET | Freq: Three times a day (TID) | ORAL | 0 refills | Status: DC | PRN

## 2018-05-04 NOTE — Telephone Encounter (Signed)
Received call from patient.   Requested refill on MS Contin and Oxycodone.   Ok to refill??  Last office visit 02/12/2018.  Last refill 03/31/2018.

## 2018-05-05 DIAGNOSIS — R030 Elevated blood-pressure reading, without diagnosis of hypertension: Secondary | ICD-10-CM | POA: Diagnosis not present

## 2018-05-05 DIAGNOSIS — N390 Urinary tract infection, site not specified: Secondary | ICD-10-CM | POA: Diagnosis not present

## 2018-05-05 DIAGNOSIS — R319 Hematuria, unspecified: Secondary | ICD-10-CM | POA: Diagnosis not present

## 2018-05-14 DIAGNOSIS — R197 Diarrhea, unspecified: Secondary | ICD-10-CM | POA: Diagnosis not present

## 2018-05-15 ENCOUNTER — Ambulatory Visit
Admission: RE | Admit: 2018-05-15 | Discharge: 2018-05-15 | Disposition: A | Discharge status: Home / Self Care | Payer: Medicare Other | Source: Ambulatory Visit | Attending: Family Medicine | Admitting: Family Medicine

## 2018-05-15 DIAGNOSIS — Z780 Asymptomatic menopausal state: Secondary | ICD-10-CM | POA: Diagnosis not present

## 2018-05-15 DIAGNOSIS — Z1231 Encounter for screening mammogram for malignant neoplasm of breast: Secondary | ICD-10-CM | POA: Diagnosis not present

## 2018-05-15 DIAGNOSIS — Z1239 Encounter for other screening for malignant neoplasm of breast: Secondary | ICD-10-CM

## 2018-05-15 DIAGNOSIS — E2839 Other primary ovarian failure: Secondary | ICD-10-CM

## 2018-05-15 DIAGNOSIS — M85851 Other specified disorders of bone density and structure, right thigh: Secondary | ICD-10-CM | POA: Diagnosis not present

## 2018-05-16 DIAGNOSIS — Z87891 Personal history of nicotine dependence: Secondary | ICD-10-CM | POA: Diagnosis not present

## 2018-05-16 DIAGNOSIS — R42 Dizziness and giddiness: Secondary | ICD-10-CM | POA: Diagnosis not present

## 2018-05-16 DIAGNOSIS — N390 Urinary tract infection, site not specified: Secondary | ICD-10-CM | POA: Diagnosis not present

## 2018-05-16 DIAGNOSIS — R319 Hematuria, unspecified: Secondary | ICD-10-CM | POA: Diagnosis not present

## 2018-05-16 DIAGNOSIS — Z8551 Personal history of malignant neoplasm of bladder: Secondary | ICD-10-CM | POA: Diagnosis not present

## 2018-05-16 DIAGNOSIS — Z79899 Other long term (current) drug therapy: Secondary | ICD-10-CM | POA: Diagnosis not present

## 2018-05-16 DIAGNOSIS — E7800 Pure hypercholesterolemia, unspecified: Secondary | ICD-10-CM | POA: Diagnosis not present

## 2018-05-18 ENCOUNTER — Encounter: Payer: Self-pay | Admitting: *Deleted

## 2018-06-03 ENCOUNTER — Other Ambulatory Visit: Payer: Self-pay | Admitting: Family Medicine

## 2018-06-03 MED ORDER — OXYCODONE HCL 5 MG PO TABS: 5.0000 mg | ORAL_TABLET | Freq: Three times a day (TID) | ORAL | 0 refills | Status: DC | PRN

## 2018-06-03 MED ORDER — MORPHINE SULFATE ER 15 MG PO TBCR: 15.0000 mg | EXTENDED_RELEASE_TABLET | Freq: Two times a day (BID) | ORAL | 0 refills | Status: DC

## 2018-06-03 NOTE — Telephone Encounter (Signed)
Refill no oxycodone and morphine to [facility].

## 2018-06-03 NOTE — Telephone Encounter (Signed)
Ok to refill??  Last office visit 02/12/2018.  Last refill 05/04/2018.

## 2018-06-08 ENCOUNTER — Encounter: Payer: Self-pay | Admitting: Family Medicine

## 2018-06-08 ENCOUNTER — Other Ambulatory Visit: Payer: Self-pay

## 2018-06-08 ENCOUNTER — Ambulatory Visit (INDEPENDENT_AMBULATORY_CARE_PROVIDER_SITE_OTHER): Payer: Medicare Other | Admitting: Family Medicine

## 2018-06-08 VITALS — BP 138/82 | HR 84 | Temp 98.1°F | Resp 14 | Ht 66.0 in | Wt 188.0 lb

## 2018-06-08 DIAGNOSIS — N390 Urinary tract infection, site not specified: Secondary | ICD-10-CM | POA: Diagnosis not present

## 2018-06-08 DIAGNOSIS — M8580 Other specified disorders of bone density and structure, unspecified site: Secondary | ICD-10-CM

## 2018-06-08 DIAGNOSIS — I10 Essential (primary) hypertension: Secondary | ICD-10-CM | POA: Diagnosis not present

## 2018-06-08 DIAGNOSIS — E038 Other specified hypothyroidism: Secondary | ICD-10-CM | POA: Diagnosis not present

## 2018-06-08 DIAGNOSIS — D509 Iron deficiency anemia, unspecified: Secondary | ICD-10-CM

## 2018-06-08 DIAGNOSIS — R609 Edema, unspecified: Secondary | ICD-10-CM

## 2018-06-08 MED ORDER — ONDANSETRON 8 MG PO TBDP: 8.0000 mg | ORAL_TABLET | Freq: Three times a day (TID) | ORAL | 2 refills | Status: DC | PRN

## 2018-06-08 NOTE — Assessment & Plan Note (Signed)
Blood pressure looks okay however she is fluid overloaded in her lower extremities.  She has not been taking the Lasix.  Recommend that she take her Lasix daily for 3 days also elevate her feet get off of the sodium in the excessive sugar.

## 2018-06-08 NOTE — Assessment & Plan Note (Signed)
For her fatigue I will recheck her thyroid function level as well as CBC ensure no anemia which she has had in the past.

## 2018-06-08 NOTE — Patient Instructions (Addendum)
Lasix , take 1 tablet once a day for 3 days- for fluid on your legs Take an extra potassium with it  Nausea pills given  Cut out the candy Increase your water We will call with lab results Call the urolgist for yearly follow up  Osteopenia- Vit D 1000IU /  Calcium 1200mg  ( she can take 2 tums if easier to swallow) F/U 4 months

## 2018-06-08 NOTE — Assessment & Plan Note (Signed)
Recommend that she make a follow-up with urology she has been treated with multiple rounds of antibiotics over the years has significant resistance.  I do not see any debris in her urostomy bag today that is of concern.  As she is asymptomatic I am not can check her urine as she has colonization as well.

## 2018-06-08 NOTE — Progress Notes (Signed)
Subjective:    Patient ID: [name], female    DOB: 1950/09/23, 67 y.o.   MRN: [id number]  Patient presents for ER F/U    Pt here to f/u ER visit, she felt she had UTI on 11/5 she went to [place] was given Keflex, she felt weak. No fever, chills, no vomiting. She did not improve , then started vomiting for 1 day, and went to [facility], was given IV antibiotics, she was given an oral antibiotic discharged,  But this was changed to another after her culture, not recall the name of either antibiotics. She missed last appointment with [facility], she sees some tissue in her urostomy bag that has cleared up.   Also followed by [facility], for her chronic diarrhea, rectal pain, though nothing else can be done for the rectal pain, she has had all the work up , they are referring her to a pain clinic She was given Colesteipol 2mg  twice a day on 11/14, she felt her bowels were blocking up, so she is now on 1 a day    Hypothryoidism- normal TSH in July, states she is tired all the time  She admits to eating lots of candy states that she ate a bag of candy every week she pulled out a large bag of hard candies a variety.  States that she has not been eating healthy like she should.  She is also not been drinking enough water.  She has gained12lbs since August 2019    Taking lasix as needed   Meds reviewed    Review Of Systems:  GEN- + fatigue, denies fever, weight loss,weakness, recent illness HEENT- denies eye drainage, change in vision, nasal discharge, CVS- denies chest pain, palpitations RESP- denies SOB, cough, wheeze ABD- +N/ denies V, change in stools, abd pain GU- denies dysuria, hematuria, dribbling, incontinence MSK- denies joint pain, muscle aches, injury Neuro- denies headache, dizziness, syncope, seizure activity       Objective:    BP 138/82   Pulse 84   Temp 98.1 F (36.7 C) (Oral)   Resp 14   Ht 5\' 6"  (1.676 m)   Wt 188 lb (85.3 kg)    SpO2 98%   BMI 30.34 kg/m  GEN- NAD, alert and oriented x3 HEENT- PERRL, EOMI, non injected sclera, pink conjunctiva, MMM, oropharynx clear Neck- Supple, no thyromegaly CVS- RRR, no murmur RESP-CTAB ABD-nabs,soft,NT,ND, Urostomy in tact EXT- pitting edema mid shin Pulses- Radial, DP- 2+        Assessment & Plan:      Problem List Items Addressed This Visit      Unprioritized   Essential hypertension    Blood pressure looks okay however she is fluid overloaded in her lower extremities.  She has not been taking the Lasix.  Recommend that she take her Lasix daily for 3 days also elevate her feet get off of the sodium in the excessive sugar.      Relevant Orders   CBC with Differential/Platelet   Comprehensive metabolic panel   Hypothyroidism    For her fatigue I will recheck her thyroid function level as well as CBC ensure no anemia which she has had in the past.      Relevant Orders   TSH   Microcytic anemia   Relevant Orders   Iron, TIBC and Ferritin Panel   Osteopenia    Her most recent mammogram and bone density results.  She has osteopenia.  Discussed weightbearing exercise  as well as the amount of calcium 1200 mg which she will take in the form of Tums and vitamin D 1000 international units daily.      Peripheral edema    Lasix with potassium per above Use the compression hose      Recurrent UTI - Primary    Recommend that she make a follow-up with urology she has been treated with multiple rounds of antibiotics over the years has significant resistance.  I do not see any debris in her urostomy bag today that is of concern.  As she is asymptomatic I am not can check her urine as she has colonization as well.         Note: This dictation was prepared with Dragon dictation along with smaller phrase technology. Any transcriptional errors that result from this process are unintentional.

## 2018-06-08 NOTE — Assessment & Plan Note (Addendum)
Lasix with potassium per above Use the compression hose

## 2018-06-08 NOTE — Assessment & Plan Note (Signed)
Her most recent mammogram and bone density results.  She has osteopenia.  Discussed weightbearing exercise as well as the amount of calcium 1200 mg which she will take in the form of Tums and vitamin D 1000 international units daily.

## 2018-06-09 LAB — CBC WITH DIFFERENTIAL/PLATELET
BASOS ABS: 30 {cells}/uL (ref 0–200)
Basophils Relative: 0.5 %
EOS PCT: 4.2 %
Eosinophils Absolute: 248 cells/uL (ref 15–500)
HCT: 38.8 % (ref 35.0–45.0)
Hemoglobin: 12.7 g/dL (ref 11.7–15.5)
Lymphs Abs: 1027 cells/uL (ref 850–3900)
MCH: 26.9 pg — ABNORMAL LOW (ref 27.0–33.0)
MCHC: 32.7 g/dL (ref 32.0–36.0)
MCV: 82.2 fL (ref 80.0–100.0)
MONOS PCT: 8.1 %
MPV: 10.7 fL (ref 7.5–12.5)
NEUTROS PCT: 69.8 %
Neutro Abs: 4118 cells/uL (ref 1500–7800)
PLATELETS: 328 10*3/uL (ref 140–400)
RBC: 4.72 10*6/uL (ref 3.80–5.10)
RDW: 14.4 % (ref 11.0–15.0)
Total Lymphocyte: 17.4 %
WBC mixed population: 478 cells/uL (ref 200–950)
WBC: 5.9 10*3/uL (ref 3.8–10.8)

## 2018-06-09 LAB — IRON,TIBC AND FERRITIN PANEL: Ferritin: 92 ng/mL (ref 16–288)

## 2018-06-09 LAB — TSH: TSH: 13.54 mIU/L — ABNORMAL HIGH (ref 0.40–4.50)

## 2018-06-09 LAB — COMPREHENSIVE METABOLIC PANEL

## 2018-06-15 DIAGNOSIS — Z936 Other artificial openings of urinary tract status: Secondary | ICD-10-CM | POA: Diagnosis not present

## 2018-06-15 DIAGNOSIS — C679 Malignant neoplasm of bladder, unspecified: Secondary | ICD-10-CM | POA: Diagnosis not present

## 2018-06-18 ENCOUNTER — Other Ambulatory Visit: Payer: Self-pay | Admitting: *Deleted

## 2018-06-18 DIAGNOSIS — E038 Other specified hypothyroidism: Secondary | ICD-10-CM

## 2018-06-18 MED ORDER — LEVOTHYROXINE SODIUM 137 MCG PO TABS: 137.0000 ug | ORAL_TABLET | Freq: Every day | ORAL | 1 refills | Status: DC

## 2018-06-30 ENCOUNTER — Encounter: Payer: Self-pay | Admitting: Family Medicine

## 2018-06-30 ENCOUNTER — Ambulatory Visit (INDEPENDENT_AMBULATORY_CARE_PROVIDER_SITE_OTHER): Payer: Medicare Other | Admitting: Family Medicine

## 2018-06-30 VITALS — BP 120/80 | HR 63 | Temp 97.6°F | Resp 15 | Ht 66.0 in | Wt 185.4 lb

## 2018-06-30 DIAGNOSIS — M79675 Pain in left toe(s): Secondary | ICD-10-CM | POA: Diagnosis not present

## 2018-06-30 DIAGNOSIS — R531 Weakness: Secondary | ICD-10-CM | POA: Diagnosis not present

## 2018-06-30 DIAGNOSIS — M25472 Effusion, left ankle: Secondary | ICD-10-CM | POA: Diagnosis not present

## 2018-06-30 DIAGNOSIS — M25572 Pain in left ankle and joints of left foot: Secondary | ICD-10-CM | POA: Diagnosis not present

## 2018-06-30 DIAGNOSIS — Z87440 Personal history of urinary (tract) infections: Secondary | ICD-10-CM | POA: Diagnosis not present

## 2018-06-30 DIAGNOSIS — R8290 Unspecified abnormal findings in urine: Secondary | ICD-10-CM

## 2018-06-30 DIAGNOSIS — R937 Abnormal findings on diagnostic imaging of other parts of musculoskeletal system: Secondary | ICD-10-CM

## 2018-06-30 DIAGNOSIS — M7989 Other specified soft tissue disorders: Secondary | ICD-10-CM

## 2018-06-30 LAB — URINALYSIS, ROUTINE W REFLEX MICROSCOPIC
Bilirubin Urine: NEGATIVE
Glucose, UA: NEGATIVE
Ketones, ur: NEGATIVE
NITRITE: NEGATIVE
Specific Gravity, Urine: 1.02 (ref 1.001–1.03)
pH: 5.5 (ref 5.0–8.0)

## 2018-06-30 LAB — MICROSCOPIC MESSAGE

## 2018-06-30 MED ORDER — PREDNISONE 20 MG PO TABS: ORAL_TABLET | ORAL | 0 refills | Status: DC

## 2018-06-30 MED ORDER — CEPHALEXIN 500 MG PO CAPS: 500.0000 mg | ORAL_CAPSULE | Freq: Four times a day (QID) | ORAL | 0 refills | Status: AC

## 2018-06-30 NOTE — Progress Notes (Signed)
Patient ID: [name], female    DOB: Nov 07, 1950, 67 y.o.   MRN: [id number]  PCP: Alycia Rossetti, MD  Chief Complaint  Patient presents with  . Gout    Patient in today with c/o possible gout to left foot. Onset Friday. Also has c/o weakness and possible recurrence of UTI    Subjective:   [name] is a 67 y.o. female, presents to clinic with CC of UTI with generalized fatigue.  She states that she knows when she has urinary tract infection because she gets very tired and run down, her urine is also become "strong", is untreated and slightly malodorous.  She denies any dysuria, hematuria, flank pain or abdominal pain, no fever sweats chills or vomiting.  Her appetite is slightly less than what it usually is.  She also complains of having "gout" to her left foot and left ankle that began 4 days ago she states that it was swollen and red to the top of her foot and to her entire ankle.  She denies any injury.  It was extremely painful to bear weight so she is began using a cane.  She has no history of gout but she believes that she may have gotten it from what she ate.  She has eaten some high but she has not had a seafood, liver, pork or alcohol intake.     Patient Active Problem List   Diagnosis Date Noted  . Osteopenia 06/08/2018  . Peripheral edema 05/27/2016  . Chronic rectal pain 06/02/2015  . Rectal ulcer 11/08/2014  . GERD (gastroesophageal reflux disease) 11/08/2014  . Fistula 06/27/2014  . Protein-calorie malnutrition ([place]) 06/27/2014  . Parotid mass 06/27/2014  . BV (bacterial vaginosis) 04/05/2014  . Lung nodule 04/05/2014  . Loss of weight 02/09/2014  . Grief reaction 01/28/2014  . Leg swelling 01/14/2014  . Lung infiltrate on CT 01/14/2014  . DVT (deep venous thrombosis) ([place]) 12/17/2013  . Partial small bowel obstruction ([place]) 07/21/2013  . Chronic abdominal pain 09/23/2012  . Microcytic anemia 08/26/2012  . Encounter for screening colonoscopy  05/26/2012  . Occlusion of peripherally inserted central catheter (PICC) line ([place]) 02/27/2012  . Post-menopausal bleeding 12/19/2011  . Recurrent UTI 12/05/2011  . Diarrhea 12/05/2011  . SBO (small bowel obstruction) ([place]) 10/02/2011  . Fatty liver 10/02/2011  . Elevated LFTs 09/03/2011  . Hx of bladder cancer 09/03/2011  . Bowel obstruction ([place]) 09/03/2011  . On total parenteral nutrition (TPN) 09/03/2011  . Hyperlipidemia 01/23/2007  . Hypothyroidism 07/21/2006  . Essential hypertension 07/21/2006  . ARTHRITIS 07/21/2006     Prior to Admission medications   Medication Sig Start Date End Date Taking? Authorizing Provider  DEXILANT 60 MG capsule TAKE ONE CAPSULE BY MOUTH EVERY DAY 02/09/18  Yes [place], Modena Nunnery, MD  ELIQUIS 5 MG TABS tablet TAKE ONE TABLET BY MOUTH TWICE DAILY 07/21/17  Yes [place], Modena Nunnery, MD  furosemide (LASIX) 40 MG tablet TAKE ONE TABLET BY MOUTH TWICE DAILY Patient taking differently: TAKE ONE TABLET BY MOUTH TWICE DAILY AS NEEDED FOR FLUID 06/27/17  Yes [place], Modena Nunnery, MD  hydrocortisone 2.5 % cream APPLY RECTALLY TWICE DAILY AS NEEDED 10/03/17  Yes [place], Modena Nunnery, MD  ibuprofen (ADVIL,MOTRIN) 600 MG tablet Take 1 tablet (600 mg total) by mouth every 6 (six) hours as needed. 10/13/17  Yes Noemi [place], MD  levothyroxine (SYNTHROID, LEVOTHROID) 137 MCG tablet Take 1 tablet (137 mcg total) by mouth daily. 06/18/18  Yes [place],  Modena Nunnery, MD  lidocaine (XYLOCAINE) 5 % ointment APPLY AS DIRECTED AS NEEDED. 04/13/18  Yes [name], Modena Nunnery, MD  magnesium oxide (MAG-OX) 400 MG tablet TAKE TWO (2) TABLETS BY MOUTH TWICE DAILY 11/03/17  Yes [place], Modena Nunnery, MD  Magnesium Oxide 400 (240 Mg) MG TABS Take 1 tablet daily Patient taking differently: Take 1 tablet by mouth daily.  10/03/17  Yes [place], Modena Nunnery, MD  morphine (MS CONTIN) 15 MG 12 hr tablet Take 1 tablet (15 mg total) by mouth every 12 (twelve) hours. 06/03/18  Yes [place], Modena Nunnery, MD  Multiple  Vitamins-Minerals (MULTIVITAMIN PO) Take 1 tablet by mouth daily.   Yes [provider]  Naloxone HCl 0.4 MG/0.4ML SOAJ Inject 0.4mg  at sign of opiate overdose, may repeat in 3 minutes 02/24/18  Yes [place], Modena Nunnery, MD  ondansetron (ZOFRAN-ODT) 8 MG disintegrating tablet Take 1 tablet (8 mg total) by mouth every 8 (eight) hours as needed. for nausea 06/08/18  Yes [place], Modena Nunnery, MD  oxyCODONE (OXY IR/ROXICODONE) 5 MG immediate release tablet Take 1 tablet (5 mg total) by mouth every 8 (eight) hours as needed for severe pain. 06/03/18  Yes [place], Modena Nunnery, MD  potassium chloride SA (K-DUR,KLOR-CON) 20 MEQ tablet TAKE ONE TABLET BY MOUTH DAILY 03/05/18  Yes [place], Modena Nunnery, MD  pravastatin (PRAVACHOL) 20 MG tablet TAKE ONE TABLET BY MOUTH DAILY FOR CHOLESTEROL 01/16/18  Yes , Modena Nunnery, MD     Allergies  Allergen Reactions  . Bactrim [Sulfamethoxazole-Trimethoprim]   . Codeine Hives  . Lactose Diarrhea  . Nitrofuran Derivatives Itching  . Sulfa Antibiotics Hives  . Penicillins Rash    Has patient had a PCN reaction causing immediate rash, facial/tongue/throat swelling, SOB or lightheadedness with hypotension: No Has patient had a PCN reaction causing severe rash involving mucus membranes or skin necrosis: No Has patient had a PCN reaction that required hospitalization: No Has patient had a PCN reaction occurring within the last 10 years: No If all of the above answers are "NO", then may proceed with Cephalosporin use.  Childhood rash - no adult reactions known     Family History  Problem Relation Age of Onset  . Heart disease Mother        enlarged  . Hypertension Mother   . Diabetes Father   . Diabetes Sister   . Diabetes Maternal Grandmother   . Colon cancer Neg Hx      Social History   Socioeconomic History  . Marital status: Widowed    Spouse name: Not on file  . Number of children: 2  . Years of education: Not on file  . Highest education level: Not  on file  Occupational History    Employer: UNEMPLOYED  Social Needs  . Financial resource strain: Not on file  . Food insecurity:    Worry: Not on file    Inability: Not on file  . Transportation needs:    Medical: Not on file    Non-medical: Not on file  Tobacco Use  . Smoking status: Former Smoker    Packs/day: 0.10    Types: Cigarettes    Last attempt to quit: 10/03/2016    Years since quitting: 1.7  . Smokeless tobacco: Never Used  Substance and Sexual Activity  . Alcohol use: No  . Drug use: No  . Sexual activity: Yes    Birth control/protection: Post-menopausal  Lifestyle  . Physical activity:    Days per week: Not on file  Minutes per session: Not on file  . Stress: Not on file  Relationships  . Social connections:    Talks on phone: Not on file    Gets together: Not on file    Attends religious service: Not on file    Active member of club or organization: Not on file    Attends meetings of clubs or organizations: Not on file    Relationship status: Not on file  . Intimate partner violence:    Fear of current or ex partner: Not on file    Emotionally abused: Not on file    Physically abused: Not on file    Forced sexual activity: Not on file  Other Topics Concern  . Not on file  Social History Narrative  . Not on file     Review of Systems  Constitutional: Negative.   HENT: Negative.   Eyes: Negative.   Respiratory: Negative.   Cardiovascular: Negative.   Gastrointestinal: Negative.   Endocrine: Negative.   Genitourinary: Negative.   Musculoskeletal: Negative.   Skin: Negative.   Allergic/Immunologic: Negative.   Neurological: Negative.   Hematological: Negative.   Psychiatric/Behavioral: Negative.   All other systems reviewed and are negative.      Objective:    Vitals:   06/30/18 1206  BP: 120/80  Pulse: 63  Resp: 15  Temp: 97.6 F (36.4 C)  TempSrc: Oral  SpO2: 100%  Weight: 185 lb 6 oz (84.1 kg)  Height: 5\' 6"  (1.676 m)       Physical Exam Vitals signs and nursing note reviewed.  Constitutional:      General: She is not in acute distress.    Appearance: She is well-developed. She is not ill-appearing, toxic-appearing or diaphoretic.  HENT:     Head: Normocephalic and atraumatic.     Nose: Nose normal.     Mouth/Throat:     Mouth: Mucous membranes are moist.     Pharynx: Oropharynx is clear.  Eyes:     General:        Right eye: No discharge.        Left eye: No discharge.     Conjunctiva/sclera: Conjunctivae normal.  Neck:     Musculoskeletal: Normal range of motion. No neck rigidity.     Trachea: No tracheal deviation.  Cardiovascular:     Rate and Rhythm: Normal rate and regular rhythm.     Pulses: Normal pulses.     Heart sounds: Normal heart sounds. No murmur. No friction rub. No gallop.   Pulmonary:     Effort: Pulmonary effort is normal. No respiratory distress.     Breath sounds: Normal breath sounds. No stridor.  Abdominal:     General: Bowel sounds are normal. There is no distension.     Palpations: Abdomen is soft. There is no mass.     Tenderness: There is no abdominal tenderness. There is no right CVA tenderness, left CVA tenderness, guarding or rebound.     Hernia: No hernia is present.  Musculoskeletal: Normal range of motion.        General: Swelling and tenderness present. No deformity or signs of injury.     Left ankle: She exhibits swelling.     Right lower leg: Edema present.     Left lower leg: Edema present.     Comments: Tenderness palpation few sleeves of her dorsum of left foot, mildly to first MTP joint no erythema or edema over first MTP joint. Left ankle diffusely edematous  Skin:    General: Skin is warm and dry.     Findings: No rash.  Neurological:     Mental Status: She is alert.     Motor: No abnormal muscle tone.     Coordination: Coordination normal.  Psychiatric:        Behavior: Behavior normal.     Urinalysis, Routine w reflex microscopic  Result  Value Ref Range   Color, Urine YELLOW YELLOW   APPearance CLEAR CLEAR   Specific Gravity, Urine 1.020 1.001 - 1.03   pH 5.5 5.0 - 8.0   Glucose, UA NEGATIVE NEGATIVE   Bilirubin Urine NEGATIVE NEGATIVE   Ketones, ur NEGATIVE NEGATIVE   Hgb urine dipstick 2+ (A) NEGATIVE   Protein, ur 1+ (A) NEGATIVE   Nitrite NEGATIVE NEGATIVE   Leukocytes, UA 1+ (A) NEGATIVE   WBC, UA 20-40 (A) 0 - 5 /HPF   RBC / HPF 10-20 (A) 0 - 2 /HPF   Squamous Epithelial / LPF 0-5 < OR = 5 /HPF   Bacteria, UA MODERATE (A) NONE SEEN /HPF         Assessment & Plan:      ICD-10-CM   1. Weakness R53.1 Urinalysis, Routine w reflex microscopic    Urine Culture  2. Abnormal urine odor R82.90 Urine Culture  3. Hx of recurrent urinary tract infection Z87.440 Urine Culture  4. Pain and swelling of left ankle M25.572 CBC with Differential   W65.681 COMPLETE METABOLIC PANEL WITH GFR    Uric Acid    DG Ankle Complete Left  5. Pain and swelling of toe of left foot M79.675 CBC with Differential   E75.17 COMPLETE METABOLIC PANEL WITH GFR    Uric Acid    DG Foot Complete Left    Pt is a 67 y/o AAF presents with suspected UTI due to urine color/smell/concentration and hx of UTI with fatigue being a sx of UTI, she has had positive UTIs and positive cultures in the past for E. coli and Klebsiella, urine dip is similar to past positive urine cultures will start treatment cover with Keflex.  Culture pending.  Patient encouraged to push fluids.  She also complains of left ankle and left foot pain and swelling that she suspects is gout.  States that it was to her first MTP, dorsum of foot and diffusely to her ankle, pain is everywhere, states that she woke up with it severely painful and swollen and has gradually gotten better.  Has no previous history of gout.  She does have right ankle edema but left is greater than right and she does have some mild tenderness to palpation.  Will check uric acid levels, CBC BMP and obtain  x-ray imaging.  It is not completely consistent with gouty flare with the lack of erythema and the way she is able to ambulate.  She denies any injury, I be other osteoarthritis or arthritis flare?  Trace pending, will treat with short burst of steroids, patient encouraged to elevate and rest.   Delsa Grana, PA-C 06/30/18 12:43 PM

## 2018-07-01 LAB — COMPLETE METABOLIC PANEL WITH GFR
AG Ratio: 1.2 (calc) (ref 1.0–2.5)
ALT: 10 U/L (ref 6–29)
AST: 13 U/L (ref 10–35)
Albumin: 4.1 g/dL (ref 3.6–5.1)
Alkaline phosphatase (APISO): 74 U/L (ref 33–130)
BUN/Creatinine Ratio: 18 (calc) (ref 6–22)
BUN: 19 mg/dL (ref 7–25)
CO2: 25 mmol/L (ref 20–32)
Calcium: 9.4 mg/dL (ref 8.6–10.4)
Chloride: 104 mmol/L (ref 98–110)
Creat: 1.03 mg/dL — ABNORMAL HIGH (ref 0.50–0.99)
GFR, Est African American: 65 mL/min/{1.73_m2} (ref >=60)
GFR, Est Non African American: 56 mL/min/{1.73_m2} — ABNORMAL LOW (ref >=60)
GLUCOSE: 117 mg/dL — AB (ref 65–99)
Globulin: 3.4 g/dL (calc) (ref 1.9–3.7)
Potassium: 4.4 mmol/L (ref 3.5–5.3)
Sodium: 140 mmol/L (ref 135–146)
Total Bilirubin: 0.6 mg/dL (ref 0.2–1.2)
Total Protein: 7.5 g/dL (ref 6.1–8.1)

## 2018-07-01 LAB — URINE CULTURE
MICRO NUMBER:: 91556594
SPECIMEN QUALITY: ADEQUATE

## 2018-07-01 LAB — CBC WITH DIFFERENTIAL/PLATELET
Absolute Monocytes: 383 cells/uL (ref 200–950)
Basophils Absolute: 33 cells/uL (ref 0–200)
Basophils Relative: 0.5 %
Eosinophils Absolute: 152 cells/uL (ref 15–500)
Eosinophils Relative: 2.3 %
HCT: 37.2 % (ref 35.0–45.0)
Hemoglobin: 12.1 g/dL (ref 11.7–15.5)
Lymphs Abs: 1162 cells/uL (ref 850–3900)
MCH: 26.4 pg — ABNORMAL LOW (ref 27.0–33.0)
MCHC: 32.5 g/dL (ref 32.0–36.0)
MCV: 81.2 fL (ref 80.0–100.0)
MPV: 10.6 fL (ref 7.5–12.5)
Monocytes Relative: 5.8 %
Neutro Abs: 4871 cells/uL (ref 1500–7800)
Neutrophils Relative %: 73.8 %
Platelets: 416 10*3/uL — ABNORMAL HIGH (ref 140–400)
RBC: 4.58 10*6/uL (ref 3.80–5.10)
RDW: 15.1 % — ABNORMAL HIGH (ref 11.0–15.0)
TOTAL LYMPHOCYTE: 17.6 %
WBC: 6.6 10*3/uL (ref 3.8–10.8)

## 2018-07-01 LAB — URIC ACID: Uric Acid, Serum: 4.6 mg/dL (ref 2.5–7.0)

## 2018-07-02 ENCOUNTER — Ambulatory Visit (HOSPITAL_COMMUNITY)
Admission: RE | Admit: 2018-07-02 | Discharge: 2018-07-02 | Disposition: A | Discharge status: Home / Self Care | Payer: Medicare Other | Source: Ambulatory Visit | Attending: Family Medicine | Admitting: Family Medicine

## 2018-07-02 ENCOUNTER — Encounter: Payer: Self-pay | Admitting: Family Medicine

## 2018-07-02 DIAGNOSIS — M25472 Effusion, left ankle: Secondary | ICD-10-CM | POA: Insufficient documentation

## 2018-07-02 DIAGNOSIS — M79675 Pain in left toe(s): Secondary | ICD-10-CM | POA: Diagnosis not present

## 2018-07-02 DIAGNOSIS — M25572 Pain in left ankle and joints of left foot: Secondary | ICD-10-CM | POA: Diagnosis not present

## 2018-07-02 DIAGNOSIS — M19072 Primary osteoarthritis, left ankle and foot: Secondary | ICD-10-CM | POA: Diagnosis not present

## 2018-07-02 DIAGNOSIS — M7989 Other specified soft tissue disorders: Secondary | ICD-10-CM | POA: Insufficient documentation

## 2018-07-03 NOTE — Addendum Note (Signed)
Addended by: Delsa Grana on: 07/03/2018 02:26 PM   Modules accepted: Orders

## 2018-07-06 ENCOUNTER — Other Ambulatory Visit: Payer: Self-pay | Admitting: Family Medicine

## 2018-07-06 ENCOUNTER — Other Ambulatory Visit: Payer: Self-pay | Admitting: *Deleted

## 2018-07-06 MED ORDER — OXYCODONE HCL 5 MG PO TABS: 5.0000 mg | ORAL_TABLET | Freq: Three times a day (TID) | ORAL | 0 refills | Status: DC | PRN

## 2018-07-06 MED ORDER — MORPHINE SULFATE ER 15 MG PO TBCR: 15.0000 mg | EXTENDED_RELEASE_TABLET | Freq: Two times a day (BID) | ORAL | 0 refills | Status: DC

## 2018-07-06 NOTE — Telephone Encounter (Signed)
Received call from patient.   Requested refill on MS Contin and Oxycodone.   Ok to refill??  Last office visit 06/30/2018.   Last refill 06/03/2018.

## 2018-07-07 DIAGNOSIS — M79672 Pain in left foot: Secondary | ICD-10-CM | POA: Diagnosis not present

## 2018-07-07 DIAGNOSIS — M7989 Other specified soft tissue disorders: Secondary | ICD-10-CM | POA: Diagnosis not present

## 2018-07-15 ENCOUNTER — Other Ambulatory Visit: Payer: Self-pay | Admitting: Family Medicine

## 2018-07-15 DIAGNOSIS — E038 Other specified hypothyroidism: Secondary | ICD-10-CM

## 2018-07-22 DIAGNOSIS — Z936 Other artificial openings of urinary tract status: Secondary | ICD-10-CM | POA: Diagnosis not present

## 2018-07-22 DIAGNOSIS — C679 Malignant neoplasm of bladder, unspecified: Secondary | ICD-10-CM | POA: Diagnosis not present

## 2018-08-04 ENCOUNTER — Other Ambulatory Visit: Payer: Self-pay | Admitting: *Deleted

## 2018-08-04 MED ORDER — OXYCODONE HCL 5 MG PO TABS: 5.0000 mg | ORAL_TABLET | Freq: Three times a day (TID) | ORAL | 0 refills | Status: DC | PRN

## 2018-08-04 MED ORDER — MORPHINE SULFATE ER 15 MG PO TBCR: 15.0000 mg | EXTENDED_RELEASE_TABLET | Freq: Two times a day (BID) | ORAL | 0 refills | Status: DC

## 2018-08-04 NOTE — Telephone Encounter (Signed)
Patient daughter in office requesting refills for mother's medications, Morphine and Oxycodone.   Ok to refill??  Last office visit 06/30/2018.  Last refill 07/06/2018.

## 2018-08-05 ENCOUNTER — Other Ambulatory Visit: Payer: Self-pay | Admitting: Family Medicine

## 2018-08-18 ENCOUNTER — Other Ambulatory Visit: Payer: Self-pay

## 2018-08-18 ENCOUNTER — Ambulatory Visit (INDEPENDENT_AMBULATORY_CARE_PROVIDER_SITE_OTHER): Payer: Medicare Other | Admitting: Family Medicine

## 2018-08-18 ENCOUNTER — Encounter: Payer: Self-pay | Admitting: Family Medicine

## 2018-08-18 VITALS — BP 134/66 | HR 88 | Temp 97.9°F | Resp 14 | Ht 66.0 in | Wt 185.0 lb

## 2018-08-18 DIAGNOSIS — Z8551 Personal history of malignant neoplasm of bladder: Secondary | ICD-10-CM

## 2018-08-18 DIAGNOSIS — R8290 Unspecified abnormal findings in urine: Secondary | ICD-10-CM | POA: Diagnosis not present

## 2018-08-18 DIAGNOSIS — N390 Urinary tract infection, site not specified: Secondary | ICD-10-CM

## 2018-08-18 DIAGNOSIS — R7301 Impaired fasting glucose: Secondary | ICD-10-CM | POA: Diagnosis not present

## 2018-08-18 DIAGNOSIS — E038 Other specified hypothyroidism: Secondary | ICD-10-CM | POA: Diagnosis not present

## 2018-08-18 LAB — URINALYSIS, ROUTINE W REFLEX MICROSCOPIC
Bilirubin Urine: NEGATIVE
Glucose, UA: NEGATIVE
Ketones, ur: NEGATIVE
Nitrite: NEGATIVE
Specific Gravity, Urine: 1.025 (ref 1.001–1.03)
pH: 6 (ref 5.0–8.0)

## 2018-08-18 LAB — MICROSCOPIC MESSAGE

## 2018-08-18 MED ORDER — VITAMIN D2 50 MCG (2000 UT) PO TABS: ORAL_TABLET | ORAL | Status: AC

## 2018-08-18 NOTE — Progress Notes (Signed)
   Subjective:    Patient ID: [name], female    DOB: [birth year], 68 y.o.   MRN: [id number]  Patient presents for Malodorous Urine (x1 week- strong odor to urine, fatigue- changed ostomy bag this AM- had left over ABTx from last round that she took)  Pt here with odor urine and feeling weak. She took keflex randomly last week. She stopped on Sunday. No fever, no fever. Urine color has improved. She just put on new ostomy bag before visit, no abdominal pain Drinking 2L of soda PEPSI/[place] Dew every day   Chronic diarrhea- seen by [facility]- taking Colestipol, scheduled for a manometry next week   Hypothyroidism approx 2 months ago, TSH abnormal at 13, synthroid increased to 144mcg daily, she has been taking every morning with her reflux pill but states she has been doing this for years. Often so tired and weak, she cant get out of the bed and wants to sleep. She does care for her grandchild 3 days a weeks and is able to function Denies feeling depressed or anxious     Review Of Systems:  GEN- denies fatigue, fever, weight loss,weakness, recent illness HEENT- denies eye drainage, change in vision, nasal discharge, CVS- denies chest pain, palpitations RESP- denies SOB, cough, wheeze ABD- denies N/V, change in stools, abd pain GU- denies dysuria, hematuria, dribbling, incontinence MSK- denies joint pain, muscle aches, injury Neuro- denies headache, dizziness, syncope, seizure activity       Objective:    BP 134/66   Pulse 88   Temp 97.9 F (36.6 C) (Oral)   Resp 14   Ht 5\' 6"  (1.676 m)   Wt 185 lb (83.9 kg)   SpO2 97%   BMI 29.86 kg/m  GEN- NAD, alert and oriented x3 HEENT- PERRL, EOMI, non injected sclera, pink conjunctiva, MMM, oropharynx clear Neck- Supple, no thyromegaly CVS- RRR, no murmur RESP-CTAB ABD-NABS,soft,NT,ND, urostomy in tact, clear urine in bag  EXT- trace ankle edema Pulses- Radial,2+        Assessment & Plan:      Problem List Items  Addressed This Visit      Unprioritized   Hx of bladder cancer   Hypothyroidism    Recheck TFT today, her fatigue is likley MTF with her chronic pain, diarrhea, lack of exercise, thyroid       Relevant Orders   TSH   T3, free   T4, free   Recurrent UTI    UA looks better than her previous ones, she also finished antibiotics Sunday, though she took random doses Has had resistant bacteria, await culture, urine clear in ostomy,no fever, besides fatigue/weakness no specific infectious symptoms She is also going to call her urologist for f/u Has history of bladder cancer       Relevant Orders   CBC with Differential/Platelet   Comprehensive metabolic panel    Other Visit Diagnoses    Malodorous urine    -  Primary   Relevant Orders   Urinalysis, Routine w reflex microscopic (Completed)   Urine Culture   Elevated fasting glucose       check A1C, she is drinking 2L of soda daily, this is affecting overall health, UTI, Bowel movements with excessive sugar, advised to stop the soda, drink water   Relevant Orders   Hemoglobin A1c      Note: This dictation was prepared with Dragon dictation along with smaller phrase technology. Any transcriptional errors that result from this process are unintentional.

## 2018-08-18 NOTE — Assessment & Plan Note (Signed)
Recheck TFT today, her fatigue is likley MTF with her chronic pain, diarrhea, lack of exercise, thyroid

## 2018-08-18 NOTE — Patient Instructions (Addendum)
Call Dr. Karsten Ro - [facility] [phone] F/U 4 months

## 2018-08-18 NOTE — Assessment & Plan Note (Signed)
UA looks better than her previous ones, she also finished antibiotics Sunday, though she took random doses Has had resistant bacteria, await culture, urine clear in ostomy,no fever, besides fatigue/weakness no specific infectious symptoms She is also going to call her urologist for f/u Has history of bladder cancer

## 2018-08-19 LAB — CBC WITH DIFFERENTIAL/PLATELET
Absolute Monocytes: 551 cells/uL (ref 200–950)
BASOS ABS: 20 {cells}/uL (ref 0–200)
Basophils Relative: 0.3 %
EOS ABS: 163 {cells}/uL (ref 15–500)
EOS PCT: 2.4 %
HCT: 37.8 % (ref 35.0–45.0)
Hemoglobin: 12.7 g/dL (ref 11.7–15.5)
Lymphs Abs: 1428 cells/uL (ref 850–3900)
MCH: 27.2 pg (ref 27.0–33.0)
MCHC: 33.6 g/dL (ref 32.0–36.0)
MCV: 80.9 fL (ref 80.0–100.0)
MONOS PCT: 8.1 %
MPV: 10.2 fL (ref 7.5–12.5)
Neutro Abs: 4638 cells/uL (ref 1500–7800)
Neutrophils Relative %: 68.2 %
Platelets: 353 10*3/uL (ref 140–400)
RBC: 4.67 10*6/uL (ref 3.80–5.10)
RDW: 16.4 % — ABNORMAL HIGH (ref 11.0–15.0)
Total Lymphocyte: 21 %
WBC: 6.8 10*3/uL (ref 3.8–10.8)

## 2018-08-19 LAB — T4, FREE: Free T4: 1.2 ng/dL (ref 0.8–1.8)

## 2018-08-19 LAB — HEMOGLOBIN A1C
EAG (MMOL/L): 6.2 (calc)
Hgb A1c MFr Bld: 5.5 % of total Hgb (ref <5.7)
Mean Plasma Glucose: 111 (calc)

## 2018-08-19 LAB — URINE CULTURE
MICRO NUMBER:: 209289
SPECIMEN QUALITY:: ADEQUATE

## 2018-08-19 LAB — COMPREHENSIVE METABOLIC PANEL
AG Ratio: 1.4 (calc) (ref 1.0–2.5)
ALT: 13 U/L (ref 6–29)
AST: 15 U/L (ref 10–35)
Albumin: 4.1 g/dL (ref 3.6–5.1)
Alkaline phosphatase (APISO): 74 U/L (ref 37–153)
BUN/Creatinine Ratio: 16 (calc) (ref 6–22)
BUN: 18 mg/dL (ref 7–25)
CO2: 26 mmol/L (ref 20–32)
Calcium: 8.5 mg/dL — ABNORMAL LOW (ref 8.6–10.4)
Chloride: 105 mmol/L (ref 98–110)
Creat: 1.1 mg/dL — ABNORMAL HIGH (ref 0.50–0.99)
Globulin: 2.9 g/dL (calc) (ref 1.9–3.7)
Glucose, Bld: 95 mg/dL (ref 65–99)
Potassium: 4 mmol/L (ref 3.5–5.3)
Sodium: 140 mmol/L (ref 135–146)
Total Bilirubin: 0.9 mg/dL (ref 0.2–1.2)
Total Protein: 7 g/dL (ref 6.1–8.1)

## 2018-08-19 LAB — TSH: TSH: 2.4 mIU/L (ref 0.40–4.50)

## 2018-08-19 LAB — T3, FREE: T3 FREE: 2.7 pg/mL (ref 2.3–4.2)

## 2018-08-26 DIAGNOSIS — G894 Chronic pain syndrome: Secondary | ICD-10-CM | POA: Diagnosis not present

## 2018-08-28 DIAGNOSIS — C679 Malignant neoplasm of bladder, unspecified: Secondary | ICD-10-CM | POA: Diagnosis not present

## 2018-08-28 DIAGNOSIS — Z936 Other artificial openings of urinary tract status: Secondary | ICD-10-CM | POA: Diagnosis not present

## 2018-09-02 ENCOUNTER — Other Ambulatory Visit: Payer: Self-pay | Admitting: *Deleted

## 2018-09-02 MED ORDER — MORPHINE SULFATE ER 15 MG PO TBCR: 15.0000 mg | EXTENDED_RELEASE_TABLET | Freq: Two times a day (BID) | ORAL | 0 refills | Status: DC

## 2018-09-02 MED ORDER — OXYCODONE HCL 5 MG PO TABS: 5.0000 mg | ORAL_TABLET | Freq: Three times a day (TID) | ORAL | 0 refills | Status: DC | PRN

## 2018-09-02 NOTE — Telephone Encounter (Signed)
Received call from patient.   Requested refill on Oxycodone and MS Contin.   Ok to refill??  Last office visit 08/18/2018.  Last refill 08/04/2018.

## 2018-09-14 ENCOUNTER — Other Ambulatory Visit: Payer: Self-pay | Admitting: Family Medicine

## 2018-09-14 DIAGNOSIS — E038 Other specified hypothyroidism: Secondary | ICD-10-CM

## 2018-09-21 ENCOUNTER — Other Ambulatory Visit: Payer: Self-pay | Admitting: *Deleted

## 2018-09-21 ENCOUNTER — Other Ambulatory Visit: Payer: Self-pay | Admitting: Family Medicine

## 2018-09-21 DIAGNOSIS — E038 Other specified hypothyroidism: Secondary | ICD-10-CM

## 2018-09-21 MED ORDER — PRAVASTATIN SODIUM 20 MG PO TABS: ORAL_TABLET | ORAL | 1 refills | Status: DC

## 2018-09-21 MED ORDER — DEXLANSOPRAZOLE 60 MG PO CPDR: 1.0000 | DELAYED_RELEASE_CAPSULE | Freq: Every day | ORAL | 1 refills | Status: DC

## 2018-09-21 MED ORDER — APIXABAN 5 MG PO TABS: 5.0000 mg | ORAL_TABLET | Freq: Two times a day (BID) | ORAL | 3 refills | Status: DC

## 2018-09-21 MED ORDER — HYDROCORTISONE 2.5 % EX CREA: TOPICAL_CREAM | CUTANEOUS | 11 refills | Status: DC

## 2018-09-21 MED ORDER — POTASSIUM CHLORIDE CRYS ER 20 MEQ PO TBCR: 20.0000 meq | EXTENDED_RELEASE_TABLET | Freq: Every day | ORAL | 2 refills | Status: DC

## 2018-09-21 MED ORDER — LEVOTHYROXINE SODIUM 137 MCG PO TABS: 137.0000 ug | ORAL_TABLET | Freq: Every day | ORAL | 1 refills | Status: DC

## 2018-09-21 MED ORDER — FUROSEMIDE 40 MG PO TABS: ORAL_TABLET | ORAL | 0 refills | Status: DC

## 2018-09-21 MED ORDER — ONDANSETRON 8 MG PO TBDP: 8.0000 mg | ORAL_TABLET | Freq: Three times a day (TID) | ORAL | 2 refills | Status: DC | PRN

## 2018-10-01 ENCOUNTER — Other Ambulatory Visit: Payer: Self-pay | Admitting: *Deleted

## 2018-10-01 MED ORDER — MAGNESIUM OXIDE -MG SUPPLEMENT 400 (240 MG) MG PO TABS: ORAL_TABLET | ORAL | 2 refills | Status: DC

## 2018-10-01 MED ORDER — LIDOCAINE 5 % EX OINT: TOPICAL_OINTMENT | CUTANEOUS | 1 refills | Status: DC

## 2018-10-01 NOTE — Telephone Encounter (Signed)
Received call from patient.   Requested refill on MS Contin and Oxycodone.   Ok to refill??  Last office visit 08/18/2018.  Last refill 09/02/2018.

## 2018-10-02 MED ORDER — MORPHINE SULFATE ER 15 MG PO TBCR: 15.0000 mg | EXTENDED_RELEASE_TABLET | Freq: Two times a day (BID) | ORAL | 0 refills | Status: DC

## 2018-10-02 MED ORDER — OXYCODONE HCL 5 MG PO TABS: 5.0000 mg | ORAL_TABLET | Freq: Three times a day (TID) | ORAL | 0 refills | Status: DC | PRN

## 2018-10-06 ENCOUNTER — Other Ambulatory Visit: Payer: Self-pay | Admitting: *Deleted

## 2018-10-06 DIAGNOSIS — C679 Malignant neoplasm of bladder, unspecified: Secondary | ICD-10-CM | POA: Diagnosis not present

## 2018-10-06 DIAGNOSIS — Z936 Other artificial openings of urinary tract status: Secondary | ICD-10-CM | POA: Diagnosis not present

## 2018-10-06 MED ORDER — LIDOCAINE 5 % EX OINT: TOPICAL_OINTMENT | Freq: Four times a day (QID) | CUTANEOUS | 1 refills | Status: DC | PRN

## 2018-10-08 ENCOUNTER — Other Ambulatory Visit: Payer: Self-pay | Admitting: *Deleted

## 2018-10-08 MED ORDER — LIDOCAINE 5 % EX OINT: TOPICAL_OINTMENT | Freq: Four times a day (QID) | CUTANEOUS | 1 refills | Status: DC | PRN

## 2018-10-28 ENCOUNTER — Ambulatory Visit: Payer: Self-pay | Admitting: Family Medicine

## 2018-10-28 ENCOUNTER — Encounter: Payer: Self-pay | Admitting: Family Medicine

## 2018-10-28 ENCOUNTER — Other Ambulatory Visit: Payer: Self-pay

## 2018-10-28 ENCOUNTER — Ambulatory Visit (INDEPENDENT_AMBULATORY_CARE_PROVIDER_SITE_OTHER): Payer: Medicare Other | Admitting: Family Medicine

## 2018-10-28 VITALS — BP 120/78 | HR 87 | Temp 98.7°F | Resp 18 | Ht 65.5 in | Wt 182.8 lb

## 2018-10-28 DIAGNOSIS — N390 Urinary tract infection, site not specified: Secondary | ICD-10-CM

## 2018-10-28 LAB — URINALYSIS, ROUTINE W REFLEX MICROSCOPIC
Bilirubin Urine: NEGATIVE
Glucose, UA: NEGATIVE
Hyaline Cast: NONE SEEN /LPF
Ketones, ur: NEGATIVE
Nitrite: NEGATIVE
Specific Gravity, Urine: 1.015 (ref 1.001–1.03)
Squamous Epithelial / HPF: NONE SEEN /HPF (ref <=5)
WBC, UA: >=60 /HPF — AB (ref 0–5)
pH: 6.5 (ref 5.0–8.0)

## 2018-10-28 LAB — MICROSCOPIC MESSAGE

## 2018-10-28 MED ORDER — CEPHALEXIN 500 MG PO CAPS: 500.0000 mg | ORAL_CAPSULE | Freq: Three times a day (TID) | ORAL | 0 refills | Status: AC

## 2018-10-28 NOTE — Progress Notes (Signed)
Patient ID: [name], female    DOB: [birth year], 68 y.o.   MRN: [id number]  PCP: Alycia Rossetti, MD  Chief Complaint  Patient presents with   Urinary Tract Infection    weakness, urine is orange, x2 days,, drinking cranberry juice, no meds    Subjective:   [name] is a 68 y.o. female, presents to clinic with CC of suspected UTI x 2d HPI  UTI sx for her - including fatigue, change in urine color and odor in urostomy bag, change in appetite, she slept all day Monday and Tuesday.  Yesterday appetite and energy were a little better but she endorses pain and swelling to abdomen around urostomy bag which she says shes never had before.  That has improved today and she denies any drainage from site, denies erythema to skin.   No fever, chills, sweats, change to balance or memory. She notes she is extremely anxious about COVID-19, she's anxious and nearly having anxiety attacks worried that a worsening UTI would send her out or to the hospital and she's afraid she would get ill from being in the hospital.  She wants to avoid that as much as possible.  Although sx are improving she raised her voice stating "I don't want to wait!" when discussing waiting for urine culture to treat.  She is afraid of needing hospitalization and/or IV abx.  Last Abx that I see in chart are from 06/30/2018 for UTI - keflex from UC Last positive urine cultures in EMR:  08/18/2018:  neg, no abx tx  06/30/2018:  neg, tx with keflex (but stopped after 3 d due to neg culture and other causes of her sx)  05/05/2018 (care everywhere) - Klebsiella   02/12/2018:  + klebsiella pneumonia  10/13/2017:  + for multiresistant E. coli  09/01/2017:  + for multiresistant E. coli  Patient Active Problem List   Diagnosis Date Noted   Osteopenia 06/08/2018   Peripheral edema 05/27/2016   Chronic rectal pain 06/02/2015   Rectal ulcer 11/08/2014   GERD (gastroesophageal reflux disease) 11/08/2014    Fistula 06/27/2014   Protein-calorie malnutrition ([place]) 06/27/2014   Parotid mass 06/27/2014   BV (bacterial vaginosis) 04/05/2014   Lung nodule 04/05/2014   Loss of weight 02/09/2014   Grief reaction 01/28/2014   Leg swelling 01/14/2014   Lung infiltrate on CT 01/14/2014   DVT (deep venous thrombosis) ([place]) 12/17/2013   Partial small bowel obstruction ([place]) 07/21/2013   Chronic abdominal pain 09/23/2012   Microcytic anemia 08/26/2012   Encounter for screening colonoscopy 05/26/2012   Occlusion of peripherally inserted central catheter (PICC) line ([place]) 02/27/2012   Post-menopausal bleeding 12/19/2011   Recurrent UTI 12/05/2011   Diarrhea 12/05/2011   SBO (small bowel obstruction) ([place]) 10/02/2011   Fatty liver 10/02/2011   Elevated LFTs 09/03/2011   Hx of bladder cancer 09/03/2011   Bowel obstruction ([place]) 09/03/2011   On total parenteral nutrition (TPN) 09/03/2011   Hyperlipidemia 01/23/2007   Hypothyroidism 07/21/2006   Essential hypertension 07/21/2006   ARTHRITIS 07/21/2006     Prior to Admission medications   Medication Sig Start Date End Date Taking? Authorizing Provider  apixaban (ELIQUIS) 5 MG TABS tablet Take 1 tablet (5 mg total) by mouth 2 (two) times daily. 09/21/18   Alycia Rossetti, MD  dexlansoprazole (DEXILANT) 60 MG capsule Take 1 capsule (60 mg total) by mouth daily. 09/21/18   Alycia Rossetti, MD  Ergocalciferol (VITAMIN D2) 50 MCG (2000 UT) TABS  tAKE 1 Tablet daily 08/18/18   Alycia Rossetti, MD  furosemide (LASIX) 40 MG tablet TAKE ONE TABLET BY MOUTH TWICE DAILY AS NEEDED FOR FLUID 09/21/18   Vic Blackbird F, MD  hydrocortisone 2.5 % cream APPLY RECTALLY TWICE DAILY AS NEEDED 09/21/18   Alycia Rossetti, MD  ibuprofen (ADVIL,MOTRIN) 600 MG tablet Take 1 tablet (600 mg total) by mouth every 6 (six) hours as needed. 10/13/17   Noemi Chapel, MD  levothyroxine (SYNTHROID, LEVOTHROID) 137 MCG tablet Take 1 tablet (137 mcg total)  by mouth daily. 09/21/18   [place], Modena Nunnery, MD  lidocaine (XYLOCAINE) 5 % ointment Apply topically 4 (four) times daily as needed. Use 1 gram topically QID PRN 10/08/18   Alycia Rossetti, MD  Magnesium Oxide 400 (240 Mg) MG TABS Take 1 tablet daily 10/01/18   Alycia Rossetti, MD  morphine (MS CONTIN) 15 MG 12 hr tablet Take 1 tablet (15 mg total) by mouth every 12 (twelve) hours. 10/02/18   Alycia Rossetti, MD  Multiple Vitamins-Minerals (MULTIVITAMIN PO) Take 1 tablet by mouth daily.    [provider]  Naloxone HCl 0.4 MG/0.4ML SOAJ Inject 0.4mg  at sign of opiate overdose, may repeat in 3 minutes 02/24/18   [place], Modena Nunnery, MD  ondansetron (ZOFRAN-ODT) 8 MG disintegrating tablet Take 1 tablet (8 mg total) by mouth every 8 (eight) hours as needed. for nausea 09/21/18   Alycia Rossetti, MD  oxyCODONE (OXY IR/ROXICODONE) 5 MG immediate release tablet Take 1 tablet (5 mg total) by mouth every 8 (eight) hours as needed for severe pain. 10/02/18   [place], Modena Nunnery, MD  potassium chloride SA (K-DUR,KLOR-CON) 20 MEQ tablet Take 1 tablet (20 mEq total) by mouth daily. 09/21/18   Alycia Rossetti, MD  pravastatin (PRAVACHOL) 20 MG tablet TAKE ONE TABLET BY MOUTH DAILY FOR CHOLESTEROL 09/21/18   Alycia Rossetti, MD     Allergies  Allergen Reactions   Bactrim [Sulfamethoxazole-Trimethoprim]    Codeine Hives   Lactose Diarrhea   Nitrofuran Derivatives Itching   Sulfa Antibiotics Hives   Penicillins Rash    Has patient had a PCN reaction causing immediate rash, facial/tongue/throat swelling, SOB or lightheadedness with hypotension: No Has patient had a PCN reaction causing severe rash involving mucus membranes or skin necrosis: No Has patient had a PCN reaction that required hospitalization: No Has patient had a PCN reaction occurring within the last 10 years: No If all of the above answers are "NO", then may proceed with Cephalosporin use.  Childhood rash - no adult reactions known       Family History  Problem Relation Age of Onset   Heart disease Mother        enlarged   Hypertension Mother    Diabetes Father    Diabetes Sister    Diabetes Maternal Grandmother    Colon cancer Neg Hx      Social History   Socioeconomic History   Marital status: Widowed    Spouse name: Not on file   Number of children: 2   Years of education: Not on file   Highest education level: Not on file  Occupational History    Employer: UNEMPLOYED  Social Needs   Financial resource strain: Not on file   Food insecurity:    Worry: Not on file    Inability: Not on file   Transportation needs:    Medical: Not on file    Non-medical: Not on file  Tobacco  Use   Smoking status: Former Smoker    Packs/day: 0.10    Types: Cigarettes    Last attempt to quit: 10/03/2016    Years since quitting: 2.0   Smokeless tobacco: Never Used  Substance and Sexual Activity   Alcohol use: No   Drug use: No   Sexual activity: Yes    Birth control/protection: Post-menopausal  Lifestyle   Physical activity:    Days per week: Not on file    Minutes per session: Not on file   Stress: Not on file  Relationships   Social connections:    Talks on phone: Not on file    Gets together: Not on file    Attends religious service: Not on file    Active member of club or organization: Not on file    Attends meetings of clubs or organizations: Not on file    Relationship status: Not on file   Intimate partner violence:    Fear of current or ex partner: Not on file    Emotionally abused: Not on file    Physically abused: Not on file    Forced sexual activity: Not on file  Other Topics Concern   Not on file  Social History Narrative   Not on file     Review of Systems  Constitutional: Positive for activity change, appetite change and fatigue. Negative for chills, fever and unexpected weight change.  HENT: Negative.   Eyes: Negative.   Respiratory: Negative.    Cardiovascular: Negative.   Gastrointestinal: Positive for abdominal pain. Negative for abdominal distention, blood in stool, constipation, diarrhea, nausea and vomiting.  Endocrine: Negative.   Genitourinary: Negative.  Negative for flank pain and hematuria.  Musculoskeletal: Negative.  Negative for arthralgias, joint swelling and myalgias.  Skin: Negative.  Negative for color change and pallor.  Allergic/Immunologic: Negative.   Neurological: Negative.  Negative for dizziness, syncope, weakness, light-headedness and headaches.  Hematological: Negative.   Psychiatric/Behavioral: Negative for agitation, confusion and decreased concentration. The patient is nervous/anxious.   All other systems reviewed and are negative.      Objective:    Vitals:   10/28/18 1145  BP: 120/78  Pulse: 87  Resp: 18  Temp: 98.7 F (37.1 C)  SpO2: 95%  Weight: 182 lb 12.8 oz (82.9 kg)  Height: 5' 5.5" (1.664 m)      Physical Exam Vitals signs and nursing note reviewed.  Constitutional:      General: She is not in acute distress.    Appearance: Normal appearance. She is well-developed. She is not ill-appearing, toxic-appearing or diaphoretic.  HENT:     Head: Normocephalic and atraumatic.     Right Ear: External ear normal.     Left Ear: External ear normal.     Mouth/Throat:     Mouth: Mucous membranes are moist.     Pharynx: Oropharynx is clear.  Eyes:     General: No scleral icterus.       Right eye: No discharge.        Left eye: No discharge.     Conjunctiva/sclera: Conjunctivae normal.  Neck:     Trachea: No tracheal deviation.  Cardiovascular:     Rate and Rhythm: Normal rate and regular rhythm.  Pulmonary:     Effort: Pulmonary effort is normal. No respiratory distress.     Breath sounds: No stridor.  Abdominal:     General: Bowel sounds are normal. There is no distension.     Palpations: Abdomen is  soft. There is no mass.     Tenderness: There is no abdominal tenderness.  There is right CVA tenderness. There is no left CVA tenderness, guarding or rebound.     Hernia: No hernia is present.     Comments: Urostomy bag secured, no drainage, surrounding skin normal appearing, no edema, induration, ttp, or erythema  Musculoskeletal: Normal range of motion.  Skin:    General: Skin is warm and dry.     Findings: No rash.  Neurological:     Mental Status: She is alert.     Motor: No abnormal muscle tone.     Coordination: Coordination normal.  Psychiatric:        Attention and Perception: Attention normal.        Mood and Affect: Mood is anxious.        Behavior: Behavior normal.      Results for orders placed or performed in visit on 10/28/18  Urinalysis, Routine w reflex microscopic  Result Value Ref Range   Color, Urine YELLOW YELLOW   APPearance CLEAR CLEAR   Specific Gravity, Urine 1.015 1.001 - 1.03   pH 6.5 5.0 - 8.0   Glucose, UA NEGATIVE NEGATIVE   Bilirubin Urine NEGATIVE NEGATIVE   Ketones, ur NEGATIVE NEGATIVE   Hgb urine dipstick 2+ (A) NEGATIVE   Protein, ur 1+ (A) NEGATIVE   Nitrite NEGATIVE NEGATIVE   Leukocytes,Ua 2+ (A) NEGATIVE   WBC, UA > OR = 60 (A) 0 - 5 /HPF   RBC / HPF 10-20 (A) 0 - 2 /HPF   Squamous Epithelial / LPF NONE SEEN < OR = 5 /HPF   Bacteria, UA MODERATE (A) NONE SEEN /HPF   Hyaline Cast NONE SEEN NONE SEEN /LPF  Microscopic Message  Result Value Ref Range   Note      Culture ordered and pending    Assessment & Plan:      ICD-10-CM   1. Urinary tract infection without hematuria, site unspecified N39.0 Urinalysis, Routine w reflex microscopic    Urine Culture   will start tx emperically with keflex, due to CVA tenderness, reported sx, appearance of urine and +dip, following culture    Was hoping to avoid abx and wait, but with CVA tenderness and urinalysis + will start keflex and follow culture closely.  ER/hospital precautions reviewed.    Delsa Grana, PA-C 10/28/18 1:02 PM

## 2018-10-29 LAB — URINE CULTURE
MICRO NUMBER:: 431851
SPECIMEN QUALITY:: ADEQUATE

## 2018-11-04 ENCOUNTER — Other Ambulatory Visit: Payer: Self-pay | Admitting: *Deleted

## 2018-11-04 DIAGNOSIS — C679 Malignant neoplasm of bladder, unspecified: Secondary | ICD-10-CM | POA: Diagnosis not present

## 2018-11-04 DIAGNOSIS — Z936 Other artificial openings of urinary tract status: Secondary | ICD-10-CM | POA: Diagnosis not present

## 2018-11-04 MED ORDER — OXYCODONE HCL 5 MG PO TABS: 5.0000 mg | ORAL_TABLET | Freq: Three times a day (TID) | ORAL | 0 refills | Status: DC | PRN

## 2018-11-04 MED ORDER — MORPHINE SULFATE ER 15 MG PO TBCR: 15.0000 mg | EXTENDED_RELEASE_TABLET | Freq: Two times a day (BID) | ORAL | 0 refills | Status: DC

## 2018-11-04 NOTE — Telephone Encounter (Signed)
Received call from patient.   Requested refill on MS Contin and Oxycodone.  Ok to refill??  Last office visit 10/28/2018.  Last refill 10/02/2018 on both.

## 2018-11-24 ENCOUNTER — Other Ambulatory Visit: Payer: Self-pay | Admitting: Family Medicine

## 2018-12-07 ENCOUNTER — Other Ambulatory Visit: Payer: Self-pay | Admitting: Family Medicine

## 2018-12-07 MED ORDER — OXYCODONE HCL 5 MG PO TABS: 5.0000 mg | ORAL_TABLET | Freq: Three times a day (TID) | ORAL | 0 refills | Status: DC | PRN

## 2018-12-07 MED ORDER — MORPHINE SULFATE ER 15 MG PO TBCR: 15.0000 mg | EXTENDED_RELEASE_TABLET | Freq: Two times a day (BID) | ORAL | 0 refills | Status: DC

## 2018-12-07 NOTE — Telephone Encounter (Signed)
Ok to refill??  Last office visit 10/28/2018.  Last refill 11/04/2018 on both.

## 2018-12-07 NOTE — Telephone Encounter (Signed)
Pt needs refill on morphine and her oxycodone to [facility]

## 2018-12-10 ENCOUNTER — Other Ambulatory Visit: Payer: Self-pay | Admitting: *Deleted

## 2018-12-10 NOTE — Telephone Encounter (Signed)
Received call from patient.   Reports that pain medications were sent to mail order by mistake. Requested to have medications re-sent to retail pharmacy. States that she is out of medication at this time.   Call placed to [facility] mail order. Advised that prescriptions have been sent out of warehouse and are set to arrive on 12/12/2018. MD please advise.   Also requested refill on Lomotil for loose stools. States that she was receiving 120 tabs from MD at [facility]. Ok to refill?

## 2018-12-11 MED ORDER — DIPHENOXYLATE-ATROPINE 2.5-0.025 MG PO TABS: 1.0000 | ORAL_TABLET | Freq: Four times a day (QID) | ORAL | 0 refills | Status: DC | PRN

## 2018-12-15 MED ORDER — MORPHINE SULFATE ER 15 MG PO TBCR: 15.0000 mg | EXTENDED_RELEASE_TABLET | Freq: Two times a day (BID) | ORAL | 0 refills | Status: DC

## 2018-12-15 MED ORDER — OXYCODONE HCL 5 MG PO TABS: 5.0000 mg | ORAL_TABLET | Freq: Three times a day (TID) | ORAL | 0 refills | Status: DC | PRN

## 2018-12-15 NOTE — Telephone Encounter (Signed)
I sent 2 week scripts to [facility]

## 2018-12-15 NOTE — Addendum Note (Signed)
Addended by: Vic Blackbird F on: 12/15/2018 04:29 PM   Modules accepted: Orders

## 2018-12-15 NOTE — Telephone Encounter (Signed)
Call placed to patient and patient made aware per VM.  

## 2018-12-15 NOTE — Telephone Encounter (Signed)
Received call from patient.   States that she was informed that [employer] is running on delay and will not be able to get her pain medication until 12/17/2018 via mail order.   Requested partial prescription to be sent to retail pharmacy for pick up.   MD please advise.

## 2018-12-23 ENCOUNTER — Telehealth: Payer: Self-pay | Admitting: *Deleted

## 2018-12-23 MED ORDER — OXYCODONE HCL 5 MG PO TABS: 5.0000 mg | ORAL_TABLET | Freq: Three times a day (TID) | ORAL | 0 refills | Status: DC | PRN

## 2018-12-23 MED ORDER — MORPHINE SULFATE ER 15 MG PO TBCR: 15.0000 mg | EXTENDED_RELEASE_TABLET | Freq: Two times a day (BID) | ORAL | 0 refills | Status: DC

## 2018-12-23 NOTE — Telephone Encounter (Signed)
Received call from patient. [phone]~ telephone.   Reports that she still has not received pain medications from mail order. Reports that she has contacted them and not timeline given for possible delivery.   Call placed to [facility]~ [phone]~ telephone. Spoke with Liane Comber, representative. Reports that shipment left warehouse on 12/09/2018. Reports that medication was shipped using [employer]PS. States that USPS tracking shows medication is in transit, but there is no delivery date at this time. States that pharmacy is working with [employer]PS to determine where package actually is and if it has been lost. Reference #[id number].  Patient states that partial fill of prescription was obtained on 12/15/2018.  She will be out of medication on 12/30/2018. Requested to have next prescription be the full prescriptions at [facility]. States that she will contact office if prescriptions from [facility] are ever received.   Ok to refill MS Contin and Oxycodone?

## 2018-12-23 NOTE — Telephone Encounter (Signed)
Prescription refilled.

## 2018-12-28 ENCOUNTER — Telehealth: Payer: Self-pay | Admitting: *Deleted

## 2018-12-28 NOTE — Telephone Encounter (Signed)
Received call from patient.   States that she wanted to report that she received prescriptions for pain medication from mail order. She has not filled prescriptions at [facility] at this time. Reports that she will use that prescription in August.   Call placed to pharmacy. Verified that prescriptions have not been filled yet. States that profiled prescriptions are good x6 months.   MD to be made aware. +

## 2018-12-28 NOTE — Telephone Encounter (Signed)
noted 

## 2018-12-30 ENCOUNTER — Telehealth: Payer: Self-pay | Admitting: Family Medicine

## 2018-12-30 NOTE — Telephone Encounter (Signed)
Sherry from [facility] called states she needs a hard copy of the 10 day supply for her oxycodone, and morphine. She said that they sent out a 10 day supply to patient once she reported the 30 day supply was lost in the mail.   Reference # [id number]  CB# [phone]

## 2018-12-30 NOTE — Telephone Encounter (Signed)
Patient returned call. Reports that she has not received partial prescription from mail order. Does report that she received bill for the initial prescription and has paid that in full.   Call placed to [facility]. Advised again that prescription for partial fill will not be given as we did not approve any additional refills through mail order.

## 2018-12-30 NOTE — Telephone Encounter (Signed)
Received call from [facility] previously. Advised that prescription would not be approved for partial fill as we sent prescription to retail pharmacy.   Call placed to patient to inquire if 10 day supply has been received from mail order pharmacy. [place].

## 2019-01-06 DIAGNOSIS — Z936 Other artificial openings of urinary tract status: Secondary | ICD-10-CM | POA: Diagnosis not present

## 2019-01-06 DIAGNOSIS — C679 Malignant neoplasm of bladder, unspecified: Secondary | ICD-10-CM | POA: Diagnosis not present

## 2019-01-14 ENCOUNTER — Other Ambulatory Visit: Payer: Self-pay

## 2019-01-15 ENCOUNTER — Encounter: Payer: Self-pay | Admitting: Family Medicine

## 2019-01-15 ENCOUNTER — Ambulatory Visit (INDEPENDENT_AMBULATORY_CARE_PROVIDER_SITE_OTHER): Payer: Medicare Other | Admitting: Family Medicine

## 2019-01-15 VITALS — BP 120/84 | HR 97 | Temp 97.8°F | Resp 17 | Ht 66.0 in | Wt 184.0 lb

## 2019-01-15 DIAGNOSIS — R109 Unspecified abdominal pain: Secondary | ICD-10-CM

## 2019-01-15 DIAGNOSIS — I10 Essential (primary) hypertension: Secondary | ICD-10-CM

## 2019-01-15 DIAGNOSIS — K6289 Other specified diseases of anus and rectum: Secondary | ICD-10-CM

## 2019-01-15 DIAGNOSIS — E038 Other specified hypothyroidism: Secondary | ICD-10-CM | POA: Diagnosis not present

## 2019-01-15 DIAGNOSIS — E7800 Pure hypercholesterolemia, unspecified: Secondary | ICD-10-CM

## 2019-01-15 DIAGNOSIS — G8929 Other chronic pain: Secondary | ICD-10-CM

## 2019-01-15 MED ORDER — DIPHENOXYLATE-ATROPINE 2.5-0.025 MG PO TABS: 1.0000 | ORAL_TABLET | Freq: Four times a day (QID) | ORAL | 0 refills | Status: DC | PRN

## 2019-01-15 MED ORDER — LEVOTHYROXINE SODIUM 137 MCG PO TABS: 137.0000 ug | ORAL_TABLET | Freq: Every day | ORAL | 0 refills | Status: DC

## 2019-01-15 NOTE — Progress Notes (Signed)
   Subjective:    Patient ID: [name], female    DOB: [birth year], 68 y.o.   MRN: [id number]  Patient presents for Rectal Bleeding (Needs refills on Lomotil )   Pt here to f/u meds, she is convinced she has ulcerative colitis, but none of her imaging, scopes have found this. States she read on line . Has seen multiple GI specliaist last sigmoidoscopy was 2017. Continues to have chronic diarrhea, seen by GI in the fall given Colestipol that did not work so she is using lomotil. She denies any blood in stool recently. Note she was suppose to have anal manometry done, this has been delayed due to covid  She is taking all her other meds BP and thyroid per report  Chronic abd pain/rectal pain, has morphine sulfate  She is planning to go stay with Son  For a few weeks     Review Of Systems:  GEN- denies fatigue, fever, weight loss,weakness, recent illness HEENT- denies eye drainage, change in vision, nasal discharge, CVS- denies chest pain, palpitations RESP- denies SOB, cough, wheeze ABD- denies N/V, change in stools, + abd pain GU- denies dysuria, hematuria, dribbling, incontinence MSK- denies joint pain, muscle aches, injury Neuro- denies headache, dizziness, syncope, seizure activity       Objective:    BP 120/84   Pulse 97   Temp 97.8 F (36.6 C) (Oral)   Resp 17   Ht 5\' 6"  (1.676 m)   Wt 184 lb (83.5 kg)   SpO2 100%   BMI 29.70 kg/m  GEN- NAD, alert and oriented x3 Neck- Supple, no thyromegaly CVS- RRR, no murmur RESP-CTAB ABD-NABS,soft,NT,ND, urostomy in tact, clear urine in bag  EXT- trace ankle edema Pulses- Radial,2+       Assessment & Plan:      Problem List Items Addressed This Visit      Unprioritized   Chronic abdominal pain   Chronic rectal pain    Continue pain meds Refilled Lomotil for chronic diarrhea, often affected by her diet as well Recommend F/U with GI for further testing , discussion about IBD      Essential hypertension -  Primary    Controlled no changes Check labs meds refilled      Relevant Orders   CBC with Differential/Platelet (Completed)   Comprehensive metabolic panel (Completed)   Hyperlipidemia   Relevant Orders   Lipid panel (Completed)   Hypothyroidism    Continue synthroid      Relevant Medications   levothyroxine (SYNTHROID) 137 MCG tablet   Other Relevant Orders   TSH (Completed)      Note: This dictation was prepared with Dragon dictation along with smaller phrase technology. Any transcriptional errors that result from this process are unintentional.

## 2019-01-15 NOTE — Patient Instructions (Signed)
F/U 4 months for physical  

## 2019-01-16 LAB — LIPID PANEL
Cholesterol: 161 mg/dL (ref <200)
HDL: 62 mg/dL (ref >=50)
LDL Cholesterol (Calc): 76 mg/dL (calc)
Non-HDL Cholesterol (Calc): 99 mg/dL (calc) (ref <130)
Total CHOL/HDL Ratio: 2.6 (calc) (ref <5.0)
Triglycerides: 157 mg/dL — ABNORMAL HIGH (ref <150)

## 2019-01-16 LAB — CBC WITH DIFFERENTIAL/PLATELET
Absolute Monocytes: 555 cells/uL (ref 200–950)
Basophils Absolute: 30 cells/uL (ref 0–200)
Basophils Relative: 0.4 %
Eosinophils Absolute: 129 cells/uL (ref 15–500)
Eosinophils Relative: 1.7 %
HCT: 41 % (ref 35.0–45.0)
Hemoglobin: 13.5 g/dL (ref 11.7–15.5)
Lymphs Abs: 1474 cells/uL (ref 850–3900)
MCH: 27.4 pg (ref 27.0–33.0)
MCHC: 32.9 g/dL (ref 32.0–36.0)
MCV: 83.2 fL (ref 80.0–100.0)
MPV: 9.9 fL (ref 7.5–12.5)
Monocytes Relative: 7.3 %
Neutro Abs: 5411 cells/uL (ref 1500–7800)
Neutrophils Relative %: 71.2 %
Platelets: 382 10*3/uL (ref 140–400)
RBC: 4.93 10*6/uL (ref 3.80–5.10)
RDW: 16 % — ABNORMAL HIGH (ref 11.0–15.0)
Total Lymphocyte: 19.4 %
WBC: 7.6 10*3/uL (ref 3.8–10.8)

## 2019-01-16 LAB — COMPREHENSIVE METABOLIC PANEL
AG Ratio: 1.3 (calc) (ref 1.0–2.5)
ALT: 12 U/L (ref 6–29)
AST: 16 U/L (ref 10–35)
Albumin: 4.4 g/dL (ref 3.6–5.1)
Alkaline phosphatase (APISO): 81 U/L (ref 37–153)
BUN/Creatinine Ratio: 13 (calc) (ref 6–22)
BUN: 16 mg/dL (ref 7–25)
CO2: 28 mmol/L (ref 20–32)
Calcium: 9.8 mg/dL (ref 8.6–10.4)
Chloride: 100 mmol/L (ref 98–110)
Creat: 1.26 mg/dL — ABNORMAL HIGH (ref 0.50–0.99)
Globulin: 3.5 g/dL (calc) (ref 1.9–3.7)
Glucose, Bld: 107 mg/dL — ABNORMAL HIGH (ref 65–99)
Potassium: 4 mmol/L (ref 3.5–5.3)
Sodium: 139 mmol/L (ref 135–146)
Total Bilirubin: 0.7 mg/dL (ref 0.2–1.2)
Total Protein: 7.9 g/dL (ref 6.1–8.1)

## 2019-01-16 LAB — TSH: TSH: 10.7 mIU/L — ABNORMAL HIGH (ref 0.40–4.50)

## 2019-01-17 ENCOUNTER — Encounter: Payer: Self-pay | Admitting: Family Medicine

## 2019-01-17 NOTE — Assessment & Plan Note (Signed)
Continue synthroid.

## 2019-01-17 NOTE — Assessment & Plan Note (Signed)
Controlled no changes Check labs meds refilled

## 2019-01-17 NOTE — Assessment & Plan Note (Signed)
Continue pain meds Refilled Lomotil for chronic diarrhea, often affected by her diet as well Recommend F/U with GI for further testing , discussion about IBD

## 2019-01-21 ENCOUNTER — Other Ambulatory Visit: Payer: Self-pay | Admitting: *Deleted

## 2019-01-21 DIAGNOSIS — E038 Other specified hypothyroidism: Secondary | ICD-10-CM

## 2019-01-21 MED ORDER — LEVOTHYROXINE SODIUM 150 MCG PO TABS: 150.0000 ug | ORAL_TABLET | Freq: Every day | ORAL | 3 refills | Status: DC

## 2019-02-11 ENCOUNTER — Other Ambulatory Visit: Payer: Self-pay

## 2019-02-12 ENCOUNTER — Encounter: Payer: Self-pay | Admitting: Family Medicine

## 2019-02-12 ENCOUNTER — Ambulatory Visit (INDEPENDENT_AMBULATORY_CARE_PROVIDER_SITE_OTHER): Payer: Medicare Other | Admitting: Family Medicine

## 2019-02-12 VITALS — BP 128/64 | HR 86 | Temp 98.6°F | Resp 14 | Ht 66.0 in | Wt 187.0 lb

## 2019-02-12 DIAGNOSIS — R600 Localized edema: Secondary | ICD-10-CM

## 2019-02-12 DIAGNOSIS — R609 Edema, unspecified: Secondary | ICD-10-CM

## 2019-02-12 DIAGNOSIS — E038 Other specified hypothyroidism: Secondary | ICD-10-CM | POA: Diagnosis not present

## 2019-02-12 DIAGNOSIS — I10 Essential (primary) hypertension: Secondary | ICD-10-CM | POA: Diagnosis not present

## 2019-02-12 DIAGNOSIS — Z936 Other artificial openings of urinary tract status: Secondary | ICD-10-CM | POA: Diagnosis not present

## 2019-02-12 MED ORDER — LEVOTHYROXINE SODIUM 150 MCG PO TABS: 150.0000 ug | ORAL_TABLET | Freq: Every day | ORAL | 3 refills | Status: DC

## 2019-02-12 NOTE — Progress Notes (Signed)
   Subjective:    Patient ID: [name], female    DOB: 09-01-1950, 68 y.o.   MRN: [id number]  Patient presents for Edema (swelling to BLE )    Pt here with worsening edema over past 2 weeks   Taking fluid pill as needed, not regulary only 1-2 times a week, did come back from out of town, didn't get up and elevate legs a lot , no SOB, no chest pain, no new pain in legs     Hypothyridism- she been still taking 128mcg once a day, she did not receive the 147mcg dose from her mail ordor      Review Of Systems:  GEN- denies fatigue, fever, weight loss,weakness, recent illness HEENT- denies eye drainage, change in vision, nasal discharge, CVS- denies chest pain, palpitations RESP- denies SOB, cough, wheeze ABD- denies N/V, change in stools, abd pain GU- denies dysuria, hematuria, dribbling, incontinence MSK- denies joint pain, muscle aches, injury Neuro- denies headache, dizziness, syncope, seizure activity       Objective:    BP 128/64   Pulse 86   Temp 98.6 F (37 C) (Oral)   Resp 14   Ht 5\' 6"  (1.676 m)   Wt 187 lb (84.8 kg)   SpO2 97%   BMI 30.18 kg/m  GEN- NAD, alert and oriented x3 HEENT- PERRL, EOMI, non injected sclera, pink conjunctiva, MMM, oropharynx clear Neck- Supple, no JVD CVS- RRR, no murmur RESP-CTAB ABD-NABS,urstomy in tact EXT- 1+ edema bilat, neg homansm,varicose veins  Pulses- Radial, DP- 2+        Assessment & Plan:      Problem List Items Addressed This Visit      Unprioritized   Essential hypertension    controlled      Hypothyroidism    She is to start the higher dose of synthroid      Relevant Medications   levothyroxine (SYNTHROID) 150 MCG tablet   Peripheral edema - Primary    I dont think this is DVT pt already on eliquis as well Appears to be dependent edema which she has had on and off for years Take lasix at bedtime since she hooks up to her overnight urine drain bag., given new script or urostomy supplies Take  with potassium After 1 week, can decrease to three times a week and then as needed Also wear compression hose       Presence of urostomy ([place])      Note: This dictation was prepared with Dragon dictation along with smaller phrase technology. Any transcriptional errors that result from this process are unintentional.

## 2019-02-12 NOTE — Patient Instructions (Signed)
F/U 1 month  Take lasix once a day at bedtime with potassium for 1 weeks, then take three times a week  Where the compression hose Drink more water  Take the new thyroid medicine 136mcg once a day

## 2019-02-14 ENCOUNTER — Encounter: Payer: Self-pay | Admitting: Family Medicine

## 2019-02-14 NOTE — Assessment & Plan Note (Signed)
controlled 

## 2019-02-14 NOTE — Assessment & Plan Note (Signed)
I dont think this is DVT pt already on eliquis as well Appears to be dependent edema which she has had on and off for years Take lasix at bedtime since she hooks up to her overnight urine drain bag., given new script or urostomy supplies Take with potassium After 1 week, can decrease to three times a week and then as needed Also wear compression hose

## 2019-02-14 NOTE — Assessment & Plan Note (Signed)
She is to start the higher dose of synthroid

## 2019-02-16 DIAGNOSIS — Z936 Other artificial openings of urinary tract status: Secondary | ICD-10-CM | POA: Diagnosis not present

## 2019-02-16 DIAGNOSIS — C679 Malignant neoplasm of bladder, unspecified: Secondary | ICD-10-CM | POA: Diagnosis not present

## 2019-02-22 ENCOUNTER — Other Ambulatory Visit: Payer: Self-pay | Admitting: *Deleted

## 2019-02-22 MED ORDER — MORPHINE SULFATE ER 15 MG PO TBCR: 15.0000 mg | EXTENDED_RELEASE_TABLET | Freq: Two times a day (BID) | ORAL | 0 refills | Status: DC

## 2019-02-22 MED ORDER — OXYCODONE HCL 5 MG PO TABS: 5.0000 mg | ORAL_TABLET | Freq: Three times a day (TID) | ORAL | 0 refills | Status: DC | PRN

## 2019-02-22 NOTE — Telephone Encounter (Signed)
Received call from patient.   Requested refill on Oxycodone and MS Contin. Ok to refill??  Last office visit 02/12/2019.  Last refill 12/23/2018 on both.

## 2019-02-25 ENCOUNTER — Other Ambulatory Visit: Payer: Self-pay | Admitting: *Deleted

## 2019-02-25 NOTE — Telephone Encounter (Signed)
Received call from patient.   Requested refill on Lomotil.   Ok to refill??  Last office visit 02/12/2019.  Last refill 01/15/2019.

## 2019-02-26 MED ORDER — DIPHENOXYLATE-ATROPINE 2.5-0.025 MG PO TABS: 1.0000 | ORAL_TABLET | Freq: Four times a day (QID) | ORAL | 0 refills | Status: DC | PRN

## 2019-03-05 ENCOUNTER — Other Ambulatory Visit: Payer: Self-pay | Admitting: Family Medicine

## 2019-03-12 ENCOUNTER — Other Ambulatory Visit: Payer: Self-pay

## 2019-03-15 ENCOUNTER — Ambulatory Visit (INDEPENDENT_AMBULATORY_CARE_PROVIDER_SITE_OTHER): Payer: Medicare Other | Admitting: Family Medicine

## 2019-03-15 ENCOUNTER — Encounter: Payer: Self-pay | Admitting: Family Medicine

## 2019-03-15 ENCOUNTER — Other Ambulatory Visit: Payer: Self-pay

## 2019-03-15 VITALS — BP 130/62 | HR 82 | Temp 98.4°F | Resp 14 | Ht 66.0 in | Wt 193.0 lb

## 2019-03-15 DIAGNOSIS — R600 Localized edema: Secondary | ICD-10-CM

## 2019-03-15 DIAGNOSIS — R609 Edema, unspecified: Secondary | ICD-10-CM

## 2019-03-15 DIAGNOSIS — I10 Essential (primary) hypertension: Secondary | ICD-10-CM | POA: Diagnosis not present

## 2019-03-15 DIAGNOSIS — E038 Other specified hypothyroidism: Secondary | ICD-10-CM | POA: Diagnosis not present

## 2019-03-15 NOTE — Assessment & Plan Note (Signed)
Recheck thyroid function studies on higher dose of Synthroid

## 2019-03-15 NOTE — Progress Notes (Signed)
    Subjective:    Patient ID: [name], female    DOB: 1951/04/09, 68 y.o.   MRN: [id number]  Patient presents for Follow-up (is not fasting)  For interim follow-up on her chronic medical problems.  Her thyroid was adjusted her last visit about a month ago more.  She is now 150 mcg once a day.  She had peripheral edema recommended she take Lasix daily for a week then 3 times a week as she does have a urostomy.  Her edema did improve when she was taking the Lasix daily it is now come back but not as bad. Her weight today however is up 6 pounds since her visit a month ago.  Recommended that she wear her compression hose. She admits she has been eating more, she has been eating breaad and desserts   She started walking today  Medication reviewed   Review Of Systems:  GEN- denies fatigue, fever, weight loss,weakness, recent illness HEENT- denies eye drainage, change in vision, nasal discharge, CVS- denies chest pain, palpitations RESP- denies SOB, cough, wheeze ABD- denies N/V, change in stools, abd pain GU- denies dysuria, hematuria, dribbling, incontinence MSK- denies joint pain, muscle aches, injury Neuro- denies headache, dizziness, syncope, seizure activity       Objective:    BP 130/62   Pulse 82   Temp 98.4 F (36.9 C) (Oral)   Resp 14   Ht 5\' 6"  (1.676 m)   Wt 193 lb (87.5 kg)   SpO2 96%   BMI 31.15 kg/m  GEN- NAD, alert and oriented x3 HEENT- PERRL, EOMI, non injected sclera, pink conjunctiva, MMM, oropharynx clear Neck- Supple, no JVD CVS- RRR, no murmur RESP-CTAB ABD-NABS,urstomy in tact EXT- edema bilat nonpitting to shins, ,varicose veins  Pulses- Radial, DP- 2+       Assessment & Plan:      Problem List Items Addressed This Visit      Unprioritized   Essential hypertension - Primary    Blood pressure is controlled no change in medication.  She still has some edema but she has not been eating correctly there is no sign of fluid overload  in the chest.  We will have her use her Lasix every night and see how she does as she is hooked up to her urostomy bag.  Also use her compression hose which she has not been doing.      Relevant Orders   Basic metabolic panel   Hypothyroidism    Recheck thyroid function studies on higher dose of Synthroid      Relevant Orders   TSH   T3, free   T4, free   Peripheral edema      Note: This dictation was prepared with Dragon dictation along with smaller phrase technology. Any transcriptional errors that result from this process are unintentional.

## 2019-03-15 NOTE — Assessment & Plan Note (Signed)
Blood pressure is controlled no change in medication.  She still has some edema but she has not been eating correctly there is no sign of fluid overload in the chest.  We will have her use her Lasix every night and see how she does as she is hooked up to her urostomy bag.  Also use her compression hose which she has not been doing.

## 2019-03-15 NOTE — Patient Instructions (Addendum)
F/U 3 months  Take the water pill  every day

## 2019-03-16 LAB — BASIC METABOLIC PANEL
BUN/Creatinine Ratio: 15 (calc) (ref 6–22)
BUN: 17 mg/dL (ref 7–25)
CO2: 24 mmol/L (ref 20–32)
Calcium: 8.6 mg/dL (ref 8.6–10.4)
Chloride: 108 mmol/L (ref 98–110)
Creat: 1.17 mg/dL — ABNORMAL HIGH (ref 0.50–0.99)
Glucose, Bld: 106 mg/dL — ABNORMAL HIGH (ref 65–99)
Potassium: 4 mmol/L (ref 3.5–5.3)
Sodium: 140 mmol/L (ref 135–146)

## 2019-03-16 LAB — T3, FREE: T3, Free: 3 pg/mL (ref 2.3–4.2)

## 2019-03-16 LAB — T4, FREE: Free T4: 1.2 ng/dL (ref 0.8–1.8)

## 2019-03-16 LAB — TSH: TSH: 0.79 mIU/L (ref 0.40–4.50)

## 2019-03-23 ENCOUNTER — Other Ambulatory Visit: Payer: Self-pay | Admitting: *Deleted

## 2019-03-23 MED ORDER — DIPHENOXYLATE-ATROPINE 2.5-0.025 MG PO TABS: 1.0000 | ORAL_TABLET | Freq: Four times a day (QID) | ORAL | 0 refills | Status: DC | PRN

## 2019-03-23 MED ORDER — OXYCODONE HCL 5 MG PO TABS: 5.0000 mg | ORAL_TABLET | Freq: Three times a day (TID) | ORAL | 0 refills | Status: DC | PRN

## 2019-03-23 MED ORDER — MORPHINE SULFATE ER 15 MG PO TBCR: 15.0000 mg | EXTENDED_RELEASE_TABLET | Freq: Two times a day (BID) | ORAL | 0 refills | Status: DC

## 2019-03-23 NOTE — Telephone Encounter (Signed)
Received call from patient.   Requested refill on MS Contin and Oxycodone to [facility].   Ok to refill?? Last office visit 03/15/2019. Last refill 02/22/2019.  Also requested refill on Lomotil to [facility]. Ok to refill??  Last office visit 03/15/2019. Last refill 02/26/2019.  (prescriptions are set up for the correct pharmacies)

## 2019-03-29 DIAGNOSIS — Z936 Other artificial openings of urinary tract status: Secondary | ICD-10-CM | POA: Diagnosis not present

## 2019-03-29 DIAGNOSIS — C679 Malignant neoplasm of bladder, unspecified: Secondary | ICD-10-CM | POA: Diagnosis not present

## 2019-04-12 ENCOUNTER — Other Ambulatory Visit: Payer: Self-pay

## 2019-04-12 ENCOUNTER — Encounter: Payer: Self-pay | Admitting: Family Medicine

## 2019-04-12 ENCOUNTER — Ambulatory Visit (INDEPENDENT_AMBULATORY_CARE_PROVIDER_SITE_OTHER): Payer: Medicare Other | Admitting: Family Medicine

## 2019-04-12 VITALS — BP 128/68 | HR 80 | Temp 98.4°F | Resp 16 | Ht 66.0 in | Wt 192.0 lb

## 2019-04-12 DIAGNOSIS — Z936 Other artificial openings of urinary tract status: Secondary | ICD-10-CM | POA: Diagnosis not present

## 2019-04-12 DIAGNOSIS — N390 Urinary tract infection, site not specified: Secondary | ICD-10-CM | POA: Diagnosis not present

## 2019-04-12 LAB — URINALYSIS, ROUTINE W REFLEX MICROSCOPIC
Bilirubin Urine: NEGATIVE
Glucose, UA: NEGATIVE
Hyaline Cast: NONE SEEN /LPF
Ketones, ur: NEGATIVE
Nitrite: NEGATIVE
Specific Gravity, Urine: 1.015 (ref 1.001–1.03)
pH: 5.5 (ref 5.0–8.0)

## 2019-04-12 LAB — MICROSCOPIC MESSAGE

## 2019-04-12 MED ORDER — CIPROFLOXACIN HCL 500 MG PO TABS: 500.0000 mg | ORAL_TABLET | Freq: Two times a day (BID) | ORAL | 0 refills | Status: DC

## 2019-04-12 NOTE — Progress Notes (Signed)
   Subjective:    Patient ID: [name], female    DOB: 11-16-1950, 68 y.o.   MRN: [id number]  Patient presents for Malodorous Urine (x1 week- foul smell to urine, dark and concentrated urine, weakness, lower abd pain- states that she found some old ABTx and took it and her urine got better)  Patient here with recurrent urinary tract symptoms.  She has urostomy  she had left over prednisone was given to her for gout she started taking this and states that her urine change from a dark orange color to the light yellow but she still has some discomfort in her lower abdomen still feels fatigued like she typically does during her urinary tract infections.  She denies any fever vomiting no change in her bowels.   she had Keflex in April of this year, no antibiotics none since then     Review Of Systems:  GEN- denies fatigue, fever, weight loss,weakness, recent illness HEENT- denies eye drainage, change in vision, nasal discharge, CVS- denies chest pain, palpitations RESP- denies SOB, cough, wheeze ABD- denies N/V, change in stools, abd pain GU- denies dysuria, +hematuria, dribbling, incontinence MSK- denies joint pain, muscle aches, injury Neuro- denies headache, dizziness, syncope, seizure activity       Objective:    BP 128/68   Pulse 80   Temp 98.4 F (36.9 C) (Oral)   Resp 16   Ht 5\' 6"  (1.676 m)   Wt 192 lb (87.1 kg)   SpO2 98%   BMI 30.99 kg/m  GEN- NAD, alert and oriented x3 CVS- RRR, no murmur RESP-CTAB ABD-NABS,soft,NT,ND, ustomy in tact - yellow urine in bag  EXT- trace pedal edema Pulses- Radial, DP- 2+        Assessment & Plan:      Problem List Items Addressed This Visit      Unprioritized   Presence of urostomy ([place])   Recurrent UTI - Primary    We will start her on ciprofloxacin this go around.  She has been on Keflex the last few infections.  She has allergy to penicillin and sulfa.  As well as Macrobid.  Culture has been sent.  Advised her  to stop taking prednisone at this time.  No red flags on exam.      Relevant Orders   Urinalysis, Routine w reflex microscopic (Completed)   Urine Culture      Note: This dictation was prepared with Dragon dictation along with smaller phrase technology. Any transcriptional errors that result from this process are unintentional.

## 2019-04-12 NOTE — Assessment & Plan Note (Signed)
We will start her on ciprofloxacin this go around.  She has been on Keflex the last few infections.  She has allergy to penicillin and sulfa.  As well as Macrobid.  Culture has been sent.  Advised her to stop taking prednisone at this time.  No red flags on exam.

## 2019-04-12 NOTE — Patient Instructions (Signed)
F/U 3 months  Take antibiotics

## 2019-04-14 LAB — URINE CULTURE
MICRO NUMBER:: 979114
SPECIMEN QUALITY:: ADEQUATE

## 2019-04-15 ENCOUNTER — Other Ambulatory Visit: Payer: Self-pay | Admitting: Family Medicine

## 2019-04-21 ENCOUNTER — Other Ambulatory Visit: Payer: Self-pay | Admitting: *Deleted

## 2019-04-21 MED ORDER — MORPHINE SULFATE ER 15 MG PO TBCR: 15.0000 mg | EXTENDED_RELEASE_TABLET | Freq: Two times a day (BID) | ORAL | 0 refills | Status: DC

## 2019-04-21 MED ORDER — OXYCODONE HCL 5 MG PO TABS: 5.0000 mg | ORAL_TABLET | Freq: Three times a day (TID) | ORAL | 0 refills | Status: DC | PRN

## 2019-04-21 NOTE — Telephone Encounter (Signed)
Received call from patient.   Requested refill on MS Contin and Oxycodone.   Ok to refill??  Last office visit 04/12/2019.  Last refill 03/23/2019 on both.

## 2019-05-14 ENCOUNTER — Other Ambulatory Visit: Payer: Self-pay | Admitting: *Deleted

## 2019-05-14 DIAGNOSIS — Z936 Other artificial openings of urinary tract status: Secondary | ICD-10-CM | POA: Diagnosis not present

## 2019-05-14 DIAGNOSIS — C679 Malignant neoplasm of bladder, unspecified: Secondary | ICD-10-CM | POA: Diagnosis not present

## 2019-05-14 MED ORDER — DIPHENOXYLATE-ATROPINE 2.5-0.025 MG PO TABS: 1.0000 | ORAL_TABLET | Freq: Four times a day (QID) | ORAL | 2 refills | Status: DC | PRN

## 2019-05-14 NOTE — Telephone Encounter (Signed)
Received call from patient.   Requested refill on Lomotil.  Ok to refill??  Last office visit 04/12/2019.  Last refill 03/23/2019.

## 2019-05-21 ENCOUNTER — Other Ambulatory Visit: Payer: Self-pay | Admitting: *Deleted

## 2019-05-21 MED ORDER — MORPHINE SULFATE ER 15 MG PO TBCR: 15.0000 mg | EXTENDED_RELEASE_TABLET | Freq: Two times a day (BID) | ORAL | 0 refills | Status: DC

## 2019-05-21 MED ORDER — OXYCODONE HCL 5 MG PO TABS: 5.0000 mg | ORAL_TABLET | Freq: Three times a day (TID) | ORAL | 0 refills | Status: DC | PRN

## 2019-05-21 NOTE — Telephone Encounter (Signed)
Received call from patient.   Requested refill on MS Contin and Oxycodone.   Ok to refill??  Last office visit 04/12/2019.  Last refill 04/21/2019 on both.

## 2019-05-31 ENCOUNTER — Other Ambulatory Visit: Payer: Self-pay | Admitting: Family Medicine

## 2019-06-16 DIAGNOSIS — Z936 Other artificial openings of urinary tract status: Secondary | ICD-10-CM | POA: Diagnosis not present

## 2019-06-16 DIAGNOSIS — C679 Malignant neoplasm of bladder, unspecified: Secondary | ICD-10-CM | POA: Diagnosis not present

## 2019-06-21 ENCOUNTER — Other Ambulatory Visit: Payer: Self-pay | Admitting: *Deleted

## 2019-06-21 MED ORDER — MORPHINE SULFATE ER 15 MG PO TBCR: 15.0000 mg | EXTENDED_RELEASE_TABLET | Freq: Two times a day (BID) | ORAL | 0 refills | Status: DC

## 2019-06-21 MED ORDER — OXYCODONE HCL 5 MG PO TABS: 5.0000 mg | ORAL_TABLET | Freq: Three times a day (TID) | ORAL | 0 refills | Status: DC | PRN

## 2019-06-21 MED ORDER — DIPHENOXYLATE-ATROPINE 2.5-0.025 MG PO TABS: 1.0000 | ORAL_TABLET | Freq: Four times a day (QID) | ORAL | 2 refills | Status: DC | PRN

## 2019-06-21 NOTE — Telephone Encounter (Signed)
Received call from patient.  Requested refills on MS Contin, Oxycodone, and Lomotil.   Ok to refill??  Last office visit 04/12/2019.  Last refill 05/21/2019 on MS Contin and Oxycodone. Last refill on Lomotil 05/14/2019.

## 2019-07-20 ENCOUNTER — Ambulatory Visit: Payer: Medicare Other

## 2019-07-22 ENCOUNTER — Ambulatory Visit: Payer: Medicare Other | Attending: Internal Medicine

## 2019-07-22 DIAGNOSIS — Z23 Encounter for immunization: Secondary | ICD-10-CM

## 2019-07-22 NOTE — Progress Notes (Signed)
   Covid-19 Vaccination Clinic  Name:  [name]    MRN: [id number] DOB: August 08, 1950  07/22/2019  [name] was observed post Covid-19 immunization for 15 minutes without incidence. She was provided with Vaccine Information Sheet and instruction to access the V-Safe system.   [name] was instructed to call 911 with any severe reactions post vaccine: Marland Kitchen Difficulty breathing  . Swelling of your face and throat  . A fast heartbeat  . A bad rash all over your body  . Dizziness and weakness    Immunizations Administered    Name Date Dose VIS Date Route   Pfizer COVID-19 Vaccine 07/22/2019  4:19 PM 0.3 mL 06/11/2019 Intramuscular   Manufacturer: [place]   Lot: [id number]   [place]: [id number]

## 2019-07-26 ENCOUNTER — Other Ambulatory Visit: Payer: Self-pay | Admitting: Family Medicine

## 2019-07-26 DIAGNOSIS — C679 Malignant neoplasm of bladder, unspecified: Secondary | ICD-10-CM | POA: Diagnosis not present

## 2019-07-26 DIAGNOSIS — Z936 Other artificial openings of urinary tract status: Secondary | ICD-10-CM | POA: Diagnosis not present

## 2019-07-26 MED ORDER — OXYCODONE HCL 5 MG PO TABS: 5.0000 mg | ORAL_TABLET | Freq: Three times a day (TID) | ORAL | 0 refills | Status: DC | PRN

## 2019-07-26 MED ORDER — MORPHINE SULFATE ER 15 MG PO TBCR: 15.0000 mg | EXTENDED_RELEASE_TABLET | Freq: Two times a day (BID) | ORAL | 0 refills | Status: DC

## 2019-07-26 NOTE — Telephone Encounter (Signed)
Last office visit: 04/12/2019 Last refilled:06/21/2019

## 2019-08-12 ENCOUNTER — Ambulatory Visit: Payer: Medicare Other | Attending: Internal Medicine

## 2019-08-12 DIAGNOSIS — Z23 Encounter for immunization: Secondary | ICD-10-CM | POA: Insufficient documentation

## 2019-08-12 NOTE — Progress Notes (Signed)
   Covid-19 Vaccination Clinic  Name:  [name]    MRN: [id number] DOB: August 30, 1950  08/12/2019  [name] was observed post Covid-19 immunization for 15 minutes without incidence. She was provided with Vaccine Information Sheet and instruction to access the V-Safe system.   [name] was instructed to call 911 with any severe reactions post vaccine: Marland Kitchen Difficulty breathing  . Swelling of your face and throat  . A fast heartbeat  . A bad rash all over your body  . Dizziness and weakness    Immunizations Administered    Name Date Dose VIS Date Route   Pfizer COVID-19 Vaccine 08/12/2019  5:35 PM 0.3 mL 06/11/2019 Intramuscular   Manufacturer: [place]   Lot: [id number]   [place]: [id number]

## 2019-08-18 ENCOUNTER — Other Ambulatory Visit: Payer: Self-pay | Admitting: *Deleted

## 2019-08-18 MED ORDER — OXYCODONE HCL 5 MG PO TABS: 5.0000 mg | ORAL_TABLET | Freq: Three times a day (TID) | ORAL | 0 refills | Status: DC | PRN

## 2019-08-18 MED ORDER — MORPHINE SULFATE ER 15 MG PO TBCR: 15.0000 mg | EXTENDED_RELEASE_TABLET | Freq: Two times a day (BID) | ORAL | 0 refills | Status: DC

## 2019-08-18 NOTE — Telephone Encounter (Signed)
Received call from patient.   Requested refill on Oxycodone and MS Contin.   Ok to refill??  Last office visit 04/12/2019.  Last refill 07/26/2019 on both.

## 2019-08-20 ENCOUNTER — Other Ambulatory Visit: Payer: Self-pay | Admitting: Family Medicine

## 2019-08-20 ENCOUNTER — Other Ambulatory Visit: Payer: Self-pay | Admitting: *Deleted

## 2019-08-20 MED ORDER — POTASSIUM CHLORIDE CRYS ER 20 MEQ PO TBCR: 20.0000 meq | EXTENDED_RELEASE_TABLET | Freq: Every day | ORAL | 3 refills | Status: DC

## 2019-09-11 ENCOUNTER — Other Ambulatory Visit: Payer: Self-pay

## 2019-09-11 ENCOUNTER — Emergency Department (HOSPITAL_COMMUNITY): Payer: Medicare Other

## 2019-09-11 ENCOUNTER — Encounter (HOSPITAL_COMMUNITY): Payer: Self-pay | Admitting: Emergency Medicine

## 2019-09-11 ENCOUNTER — Emergency Department (HOSPITAL_COMMUNITY)
Admission: EM | Admit: 2019-09-11 | Discharge: 2019-09-11 | Disposition: A | Discharge status: Home / Self Care | Payer: Medicare Other | Attending: Emergency Medicine | Admitting: Emergency Medicine

## 2019-09-11 DIAGNOSIS — Z8551 Personal history of malignant neoplasm of bladder: Secondary | ICD-10-CM | POA: Diagnosis not present

## 2019-09-11 DIAGNOSIS — K529 Noninfective gastroenteritis and colitis, unspecified: Secondary | ICD-10-CM | POA: Diagnosis not present

## 2019-09-11 DIAGNOSIS — R1030 Lower abdominal pain, unspecified: Secondary | ICD-10-CM | POA: Diagnosis not present

## 2019-09-11 DIAGNOSIS — Z936 Other artificial openings of urinary tract status: Secondary | ICD-10-CM | POA: Diagnosis not present

## 2019-09-11 DIAGNOSIS — I10 Essential (primary) hypertension: Secondary | ICD-10-CM | POA: Diagnosis not present

## 2019-09-11 DIAGNOSIS — R197 Diarrhea, unspecified: Secondary | ICD-10-CM | POA: Insufficient documentation

## 2019-09-11 DIAGNOSIS — R109 Unspecified abdominal pain: Secondary | ICD-10-CM | POA: Diagnosis not present

## 2019-09-11 DIAGNOSIS — Z743 Need for continuous supervision: Secondary | ICD-10-CM | POA: Diagnosis not present

## 2019-09-11 DIAGNOSIS — R5381 Other malaise: Secondary | ICD-10-CM | POA: Diagnosis not present

## 2019-09-11 DIAGNOSIS — Z79899 Other long term (current) drug therapy: Secondary | ICD-10-CM | POA: Insufficient documentation

## 2019-09-11 DIAGNOSIS — R1084 Generalized abdominal pain: Secondary | ICD-10-CM | POA: Diagnosis not present

## 2019-09-11 LAB — CBC
HCT: 36.4 % (ref 36.0–46.0)
Hemoglobin: 12.3 g/dL (ref 12.0–15.0)
MCH: 28.1 pg (ref 26.0–34.0)
MCHC: 33.8 g/dL (ref 30.0–36.0)
MCV: 83.1 fL (ref 80.0–100.0)
Platelets: 389 10*3/uL (ref 150–400)
RBC: 4.38 MIL/uL (ref 3.87–5.11)
RDW: 15.7 % — ABNORMAL HIGH (ref 11.5–15.5)
WBC: 7.6 10*3/uL (ref 4.0–10.5)
nRBC: 0 % (ref 0.0–0.2)

## 2019-09-11 LAB — COMPREHENSIVE METABOLIC PANEL
ALT: 31 U/L (ref 0–44)
AST: 21 U/L (ref 15–41)
Albumin: 3.8 g/dL (ref 3.5–5.0)
Alkaline Phosphatase: 87 U/L (ref 38–126)
Anion gap: 7 (ref 5–15)
BUN: 16 mg/dL (ref 8–23)
CO2: 23 mmol/L (ref 22–32)
Calcium: 9.1 mg/dL (ref 8.9–10.3)
Chloride: 108 mmol/L (ref 98–111)
Creatinine, Ser: 1 mg/dL (ref 0.44–1.00)
GFR calc Af Amer: >60 mL/min (ref >60)
GFR calc non Af Amer: 58 mL/min — ABNORMAL LOW (ref >60)
Glucose, Bld: 115 mg/dL — ABNORMAL HIGH (ref 70–99)
Potassium: 3.7 mmol/L (ref 3.5–5.1)
Sodium: 138 mmol/L (ref 135–145)
Total Bilirubin: 1 mg/dL (ref 0.3–1.2)
Total Protein: 7.4 g/dL (ref 6.5–8.1)

## 2019-09-11 LAB — LIPASE, BLOOD: Lipase: 18 U/L (ref 11–51)

## 2019-09-11 MED ORDER — SODIUM CHLORIDE 0.9 % IV BOLUS
1000.0000 mL | Freq: Once | INTRAVENOUS | Status: AC
  Administered 2019-09-11: 1000 mL via INTRAVENOUS

## 2019-09-11 MED ORDER — METRONIDAZOLE 500 MG PO TABS: 500.0000 mg | ORAL_TABLET | Freq: Three times a day (TID) | ORAL | 0 refills | Status: DC

## 2019-09-11 MED ORDER — IOHEXOL 300 MG/ML  SOLN
100.0000 mL | Freq: Once | INTRAMUSCULAR | Status: AC | PRN
  Administered 2019-09-11: 100 mL via INTRAVENOUS

## 2019-09-11 NOTE — ED Notes (Signed)
Awaiting labs for CT   Pt with relaxed facial features, on phone   NAD

## 2019-09-11 NOTE — ED Provider Notes (Signed)
[facility] EMERGENCY DEPARTMENT Provider Note   CSN: [id number] Arrival date & time: 09/11/19  1301     History Chief Complaint  Patient presents with  . Abdominal Pain    [name] is a 69 y.o. female.  Patient is a 69 year old female with past medical history of chronic abdominal issues.  Patient reports she has required two laparotomies in the past secondary to bowel perforations.  She also has a history of bladder cancer and has a urostomy bag.  She presents today with complaints of lower abdominal discomfort and multiple episodes of diarrhea that occurred over a 1 hour.  Just prior to presentation.  She reports some blood when she wipes, but does have a history of hemorrhoids.  Her rectum is sore, however she believes this is related to wiping.  She denies fevers or chills.  She took 3 oxycodone prior to coming here and is now feeling somewhat better.  The history is provided by the patient.  Abdominal Pain Pain location:  Suprapubic Pain quality: cramping   Pain radiates to:  Does not radiate Pain severity:  Moderate Onset quality:  Sudden Timing:  Constant Progression:  Improving Relieved by:  Nothing Worsened by:  Nothing      Past Medical History:  Diagnosis Date  . Acid reflux   . Bowel obstruction (HCC) several   Recurrent SBO secondary to adhesions  . Cancer [facility]) 2011 [facility])   diagnosed in 2009 per pt. Invasive High grade Urothelial carcinoma- s/p radiation and Chemo   . Chronic back pain   . DVT (deep venous thrombosis) ([place]) 2015  . Fatty liver   . Hyperlipidemia   . Hypertension    pt says taken off medication since has lost weight.  . Hypothyroidism   . Internal hemorrhoids   . Leukopenia 12/06/2011   HIV serology negative  . Malnutrition ([place])   . PE (pulmonary embolism)    [facility] records  . Rectal ulcer 2016   [facility]  . UTI (lower urinary tract infection)    Recurrent    Patient Active Problem List   Diagnosis  Date Noted  . Presence of urostomy ([place]) 02/12/2019  . Osteopenia 06/08/2018  . Peripheral edema 05/27/2016  . Chronic rectal pain 06/02/2015  . Rectal ulcer 11/08/2014  . GERD (gastroesophageal reflux disease) 11/08/2014  . Fistula 06/27/2014  . Protein-calorie malnutrition ([place]) 06/27/2014  . BV (bacterial vaginosis) 04/05/2014  . Lung nodule 04/05/2014  . Grief reaction 01/28/2014  . Lung infiltrate on CT 01/14/2014  . DVT (deep venous thrombosis) ([place]) 12/17/2013  . Partial small bowel obstruction ([place]) 07/21/2013  . Chronic abdominal pain 09/23/2012  . Microcytic anemia 08/26/2012  . Encounter for screening colonoscopy 05/26/2012  . Post-menopausal bleeding 12/19/2011  . Recurrent UTI 12/05/2011  . Diarrhea 12/05/2011  . SBO (small bowel obstruction) ([place]) 10/02/2011  . Fatty liver 10/02/2011  . Elevated LFTs 09/03/2011  . Hx of bladder cancer 09/03/2011  . Bowel obstruction ([place]) 09/03/2011  . Hyperlipidemia 01/23/2007  . Hypothyroidism 07/21/2006  . Essential hypertension 07/21/2006  . ARTHRITIS 07/21/2006    Past Surgical History:  Procedure Laterality Date  . ABDOMINAL SURGERY     with intestinal "puncture" x 2, exploratory surgery   . BACK SURGERY     X2  . BLADDER REMOVAL    . CHOLECYSTECTOMY    . CYSTECTOMY     breast  . HERNIA REPAIR     mesh  . ILEO CONDUIT  For bladder cancer  . PORTACATH PLACEMENT       OB History    Gravida      Para      Term      Preterm      AB      Living  2     SAB      TAB      Ectopic      Multiple      Live Births              Family History  Problem Relation Age of Onset  . Heart disease Mother        enlarged  . Hypertension Mother   . Diabetes Father   . Diabetes Sister   . Diabetes Maternal Grandmother   . Colon cancer Neg Hx     Social History   Tobacco Use  . Smoking status: Former Smoker    Packs/day: 0.10    Types: Cigarettes    Quit date: 10/03/2016    Years since  quitting: 2.9  . Smokeless tobacco: Never Used  Substance Use Topics  . Alcohol use: No  . Drug use: No    Home Medications Prior to Admission medications   Medication Sig Start Date End Date Taking? Authorizing Provider  ciprofloxacin (CIPRO) 500 MG tablet Take 1 tablet (500 mg total) by mouth 2 (two) times daily. 04/12/19   Alycia Rossetti, MD  DEXILANT 60 MG capsule TAKE 1 CAPSULE BY MOUTH  DAILY 03/05/19   [place], Modena Nunnery, MD  diphenoxylate-atropine (LOMOTIL) 2.5-0.025 MG tablet Take 1 tablet by mouth 4 (four) times daily as needed for diarrhea or loose stools. 06/21/19   Susy Frizzle, MD  ELIQUIS 5 MG TABS tablet TAKE 1 TABLET BY MOUTH  TWICE DAILY 08/23/19   Alycia Rossetti, MD  Ergocalciferol (VITAMIN D2) 50 MCG [phone] UT) TABS tAKE 1 Tablet daily 08/18/18   Alycia Rossetti, MD  furosemide (LASIX) 40 MG tablet TAKE ONE TABLET BY MOUTH  TWICE DAILY AS NEEDED FOR  FLUID 03/05/19   Alycia Rossetti, MD  hydrocortisone 2.5 % cream APPLY RECTALLY TWICE DAILY AS NEEDED 09/21/18   Alycia Rossetti, MD  ibuprofen (ADVIL,MOTRIN) 600 MG tablet Take 1 tablet (600 mg total) by mouth every 6 (six) hours as needed. 10/13/17   Noemi Chapel, MD  levothyroxine (SYNTHROID) 150 MCG tablet Take 1 tablet (150 mcg total) by mouth daily before breakfast. 02/12/19   Alycia Rossetti, MD  lidocaine (XYLOCAINE) 5 % ointment APPLY TOPICALLY 4 TIMES  DAILY AS NEEDED 04/16/19   Alycia Rossetti, MD  Magnesium Oxide 400 (240 Mg) MG TABS Take 1 tablet daily 10/01/18   Alycia Rossetti, MD  morphine (MS CONTIN) 15 MG 12 hr tablet Take 1 tablet (15 mg total) by mouth every 12 (twelve) hours. 08/18/19   Alycia Rossetti, MD  Multiple Vitamins-Minerals (MULTIVITAMIN PO) Take 1 tablet by mouth daily.    [provider]  Naloxone HCl 0.4 MG/0.4ML SOAJ Inject 0.4mg  at sign of opiate overdose, may repeat in 3 minutes 02/24/18   [place], Modena Nunnery, MD  ondansetron (ZOFRAN-ODT) 8 MG disintegrating tablet Take 1  tablet (8 mg total) by mouth every 8 (eight) hours as needed. for nausea 09/21/18   Alycia Rossetti, MD  oxyCODONE (OXY IR/ROXICODONE) 5 MG immediate release tablet Take 1 tablet (5 mg total) by mouth every 8 (eight) hours as needed for severe pain. 08/18/19  [place], Modena Nunnery, MD  potassium chloride SA (KLOR-CON) 20 MEQ tablet Take 1 tablet (20 mEq total) by mouth daily. 08/20/19   Alycia Rossetti, MD  pravastatin (PRAVACHOL) 20 MG tablet TAKE 1 TABLET BY MOUTH  DAILY FOR CHOLESTEROL 03/05/19   Alycia Rossetti, MD    Allergies    Bactrim [sulfamethoxazole-trimethoprim], Codeine, Lactose, Nitrofuran derivatives, Sulfa antibiotics, and Penicillins  Review of Systems   Review of Systems  Gastrointestinal: Positive for abdominal pain.  All other systems reviewed and are negative.   Physical Exam Updated Vital Signs BP (!) 159/86 (BP Location: Right Arm)   Pulse 67   Temp (!) 97.5 F (36.4 C) (Oral)   Resp 16   Ht 5\' 4"  (1.626 m)   Wt 86.2 kg   SpO2 98%   BMI 32.61 kg/m   Physical Exam Vitals and nursing note reviewed.  Constitutional:      General: She is not in acute distress.    Appearance: She is well-developed. She is not diaphoretic.  HENT:     Head: Normocephalic and atraumatic.  Cardiovascular:     Rate and Rhythm: Normal rate and regular rhythm.     Heart sounds: No murmur. No friction rub. No gallop.   Pulmonary:     Effort: Pulmonary effort is normal. No respiratory distress.     Breath sounds: Normal breath sounds. No wheezing.  Abdominal:     General: Bowel sounds are normal. There is no distension.     Palpations: Abdomen is soft.     Tenderness: There is abdominal tenderness in the suprapubic area. There is no right CVA tenderness, left CVA tenderness, guarding or rebound.  Musculoskeletal:        General: Normal range of motion.     Cervical back: Normal range of motion and neck supple.  Skin:    General: Skin is warm and dry.  Neurological:     Mental  Status: She is alert and oriented to person, place, and time.     ED Results / Procedures / Treatments   Labs (all labs ordered are listed, but only abnormal results are displayed) Labs Reviewed  LIPASE, BLOOD  COMPREHENSIVE METABOLIC PANEL  CBC    EKG None  Radiology No results found.  Procedures Procedures (including critical care time)  Medications Ordered in ED Medications  sodium chloride 0.9 % bolus 1,000 mL (has no administration in time range)    ED Course  I have reviewed the triage vital signs and the nursing notes.  Pertinent labs & imaging results that were available during my care of the patient were reviewed by me and considered in my medical decision making (see chart for details).    MDM Rules/Calculators/A&P  CT scan shows colitis.  Will be treated with flagyl, prn return.  She appears well-hydrated and has had no further diarrhea while in the ER.  Final Clinical Impression(s) / ED Diagnoses Final diagnoses:  None    Rx / DC Orders ED Discharge Orders    None       Veryl Speak, MD 09/11/19 1539

## 2019-09-11 NOTE — Discharge Instructions (Addendum)
Begin taking Flagyl as prescribed.  Drink plenty of fluids and get plenty of rest.  Return to the emergency department if you develop severe abdominal pain, high fevers, bloody stools, or other new and concerning symptoms.

## 2019-09-11 NOTE — ED Notes (Signed)
IV established by [name], RN  Pt , on phone, tells unknown person that she feels better, she was scared

## 2019-09-11 NOTE — ED Notes (Signed)
Call to CT re: plan of time to take to CT  Told pt is next

## 2019-09-11 NOTE — ED Triage Notes (Signed)
Dr Loel Lofty at [facility]  Also goes to [facility]- no call  Has not seen GI doc recently  Hx Chronic abd pain-   Watches her BM- D since yesterday took miralax  3 BM today - last with blood noted  Here for eval   131/81,69,97 RA

## 2019-09-11 NOTE — ED Notes (Signed)
In CT

## 2019-09-11 NOTE — ED Notes (Signed)
Pt reports lower abd pain which is chronic   She has taken her narcotic pain meds   Reports no relief  Pt has ostomy   Takes miralax and has also taken imodium since yesterday   She is speaking on cell phone almost constantly   Unsuccessful IV attempt as site is vessel is scarred

## 2019-09-13 DIAGNOSIS — Z936 Other artificial openings of urinary tract status: Secondary | ICD-10-CM | POA: Diagnosis not present

## 2019-09-13 DIAGNOSIS — C679 Malignant neoplasm of bladder, unspecified: Secondary | ICD-10-CM | POA: Diagnosis not present

## 2019-09-20 ENCOUNTER — Other Ambulatory Visit: Payer: Self-pay | Admitting: *Deleted

## 2019-09-20 MED ORDER — OXYCODONE HCL 5 MG PO TABS: 5.0000 mg | ORAL_TABLET | Freq: Three times a day (TID) | ORAL | 0 refills | Status: DC | PRN

## 2019-09-20 MED ORDER — MORPHINE SULFATE ER 15 MG PO TBCR: 15.0000 mg | EXTENDED_RELEASE_TABLET | Freq: Two times a day (BID) | ORAL | 0 refills | Status: DC

## 2019-09-20 NOTE — Telephone Encounter (Signed)
Received call from patient.   Requested refill on MS Contin and Oxycodone.   Ok to refill??  Last office visit 04/12/2019.  Last refill 08/18/2019 on both.

## 2019-10-04 ENCOUNTER — Other Ambulatory Visit: Payer: Self-pay

## 2019-10-04 ENCOUNTER — Ambulatory Visit (INDEPENDENT_AMBULATORY_CARE_PROVIDER_SITE_OTHER): Payer: Medicare Other | Admitting: Nurse Practitioner

## 2019-10-04 VITALS — BP 110/72 | HR 82 | Temp 97.8°F | Resp 18 | Ht 66.0 in | Wt 196.0 lb

## 2019-10-04 DIAGNOSIS — R7309 Other abnormal glucose: Secondary | ICD-10-CM | POA: Diagnosis not present

## 2019-10-04 DIAGNOSIS — E039 Hypothyroidism, unspecified: Secondary | ICD-10-CM

## 2019-10-04 DIAGNOSIS — R5383 Other fatigue: Secondary | ICD-10-CM | POA: Diagnosis not present

## 2019-10-04 NOTE — Progress Notes (Signed)
Acute Office Visit  Subjective:    Patient ID: [name], female    DOB: March 22, 1951, 69 y.o.   MRN: [id number]  Chief Complaint  Patient presents with  . Fatigue    last couple months, getting worst, stomach pain, hemorrhoids hurting everyday, gives out of breast easily    HPI Patient is a 69 year old female presenting to clinic with sxs of feeling fatique. She started feeling tired daily since she was made to move out of her rental home of 10 years a month ago. She reports that she moved over 60 boxes and has felt tired since. Associated sxs, nails are dry, abdominal pain for the past week. She does have occasianl ble edema non pitting that she takes prescribed lasix 40 mg at night. She has a urostomy bag that she self/home cleans and drains. No fever/chills, n/v/d, nasal congestion, sore throat. No cp/ct, other pain.  No treatments tried. She had a ER visit on 09/11/2019 with Colitis.  Her last clinic visit with labs was on 04/2019. Will obtain A1C for elevated glucose trend, Thyroid panel r/t length of time since labs, sxs, and h/o hypothyroid. U/A collected.   Past Medical History:  Diagnosis Date  . Acid reflux   . Bowel obstruction (HCC) several   Recurrent SBO secondary to adhesions  . Cancer [facility]) 2011 [facility])   diagnosed in 2009 per pt. Invasive High grade Urothelial carcinoma- s/p radiation and Chemo   . Chronic back pain   . DVT (deep venous thrombosis) ([place]) 2015  . Fatty liver   . Hyperlipidemia   . Hypertension    pt says taken off medication since has lost weight.  . Hypothyroidism   . Internal hemorrhoids   . Leukopenia 12/06/2011   HIV serology negative  . Malnutrition ([place])   . PE (pulmonary embolism)    [facility]  . Rectal ulcer 2016   [facility]  . UTI (lower urinary tract infection)    Recurrent    Past Surgical History:  Procedure Laterality Date  . ABDOMINAL SURGERY     with intestinal "puncture" x 2, exploratory surgery    . BACK SURGERY     X2  . BLADDER REMOVAL    . CHOLECYSTECTOMY    . CYSTECTOMY     breast  . HERNIA REPAIR     mesh  . ILEO CONDUIT     For bladder cancer  . PORTACATH PLACEMENT      Family History  Problem Relation Age of Onset  . Heart disease Mother        enlarged  . Hypertension Mother   . Diabetes Father   . Diabetes Sister   . Diabetes Maternal Grandmother   . Colon cancer Neg Hx     Social History   Socioeconomic History  . Marital status: Widowed    Spouse name: Not on file  . Number of children: 2  . Years of education: Not on file  . Highest education level: Not on file  Occupational History    Employer: UNEMPLOYED  Tobacco Use  . Smoking status: Former Smoker    Packs/day: 0.10    Types: Cigarettes    Quit date: 10/03/2016    Years since quitting: 3.0  . Smokeless tobacco: Never Used  Substance and Sexual Activity  . Alcohol use: No  . Drug use: No  . Sexual activity: Yes    Birth control/protection: Post-menopausal  Other Topics Concern  . Not  on file  Social History Narrative  . Not on file   Social Determinants of Health   Financial Resource Strain:   . Difficulty of Paying Living Expenses:   Food Insecurity:   . Worried About Charity fundraiser in the Last Year:   . Arboriculturist in the Last Year:   Transportation Needs:   . Film/video editor (Medical):   Marland Kitchen Lack of Transportation (Non-Medical):   Physical Activity:   . Days of Exercise per Week:   . Minutes of Exercise per Session:   Stress:   . Feeling of Stress :   Social Connections:   . Frequency of Communication with Friends and Family:   . Frequency of Social Gatherings with Friends and Family:   . Attends Religious Services:   . Active Member of Clubs or Organizations:   . Attends Archivist Meetings:   Marland Kitchen Marital Status:   Intimate Partner Violence:   . Fear of Current or Ex-Partner:   . Emotionally Abused:   Marland Kitchen Physically Abused:   . Sexually Abused:      Outpatient Medications Prior to Visit  Medication Sig Dispense Refill  . Ascorbic Acid (VITAMIN C PO) Take 1 tablet by mouth daily.    Marland Kitchen DEXILANT 60 MG capsule TAKE 1 CAPSULE BY MOUTH  DAILY 90 capsule 3  . diphenoxylate-atropine (LOMOTIL) 2.5-0.025 MG tablet Take 1 tablet by mouth 4 (four) times daily as needed for diarrhea or loose stools. 120 tablet 2  . ELIQUIS 5 MG TABS tablet TAKE 1 TABLET BY MOUTH  TWICE DAILY 180 tablet 3  . Ergocalciferol (VITAMIN D2) 50 MCG (2000 UT) TABS tAKE 1 Tablet daily 30 tablet   . furosemide (LASIX) 40 MG tablet TAKE ONE TABLET BY MOUTH  TWICE DAILY AS NEEDED FOR  FLUID 180 tablet 3  . hydrocortisone 2.5 % cream APPLY RECTALLY TWICE DAILY AS NEEDED 28.35 g 11  . ibuprofen (ADVIL,MOTRIN) 600 MG tablet Take 1 tablet (600 mg total) by mouth every 6 (six) hours as needed. 30 tablet 0  . levothyroxine (SYNTHROID) 150 MCG tablet Take 1 tablet (150 mcg total) by mouth daily before breakfast. 90 tablet 3  . lidocaine (XYLOCAINE) 5 % ointment APPLY TOPICALLY 4 TIMES  DAILY AS NEEDED 318.96 g 1  . Magnesium Oxide 400 (240 Mg) MG TABS Take 1 tablet daily 90 tablet 2  . metroNIDAZOLE (FLAGYL) 500 MG tablet Take 1 tablet (500 mg total) by mouth 3 (three) times daily. One po bid x 7 days 21 tablet 0  . morphine (MS CONTIN) 15 MG 12 hr tablet Take 1 tablet (15 mg total) by mouth every 12 (twelve) hours. 60 tablet 0  . Multiple Vitamins-Minerals (MULTIVITAMIN PO) Take 1 tablet by mouth daily.    . Naloxone HCl 0.4 MG/0.4ML SOAJ Inject 0.4mg  at sign of opiate overdose, may repeat in 3 minutes 1 Package 1  . ondansetron (ZOFRAN-ODT) 8 MG disintegrating tablet Take 1 tablet (8 mg total) by mouth every 8 (eight) hours as needed. for nausea 45 tablet 2  . oxyCODONE (OXY IR/ROXICODONE) 5 MG immediate release tablet Take 1 tablet (5 mg total) by mouth every 8 (eight) hours as needed for severe pain. 90 tablet 0  . potassium chloride SA (KLOR-CON) 20 MEQ tablet Take 1 tablet (20  mEq total) by mouth daily. 90 tablet 3  . pravastatin (PRAVACHOL) 20 MG tablet TAKE 1 TABLET BY MOUTH  DAILY FOR CHOLESTEROL 90 tablet 3  .  ciprofloxacin (CIPRO) 500 MG tablet Take 1 tablet (500 mg total) by mouth 2 (two) times daily. (Patient not taking: Reported on 09/11/2019) 14 tablet 0   No facility-administered medications prior to visit.    Allergies  Allergen Reactions  . Bactrim [Sulfamethoxazole-Trimethoprim]   . Codeine Hives  . Lactose Diarrhea  . Nitrofuran Derivatives Itching  . Sulfa Antibiotics Hives  . Penicillins Rash    Has patient had a PCN reaction causing immediate rash, facial/tongue/throat swelling, SOB or lightheadedness with hypotension: No Has patient had a PCN reaction causing severe rash involving mucus membranes or skin necrosis: No Has patient had a PCN reaction that required hospitalization: No Has patient had a PCN reaction occurring within the last 10 years: No If all of the above answers are "NO", then may proceed with Cephalosporin use.  Childhood rash - no adult reactions known    Review of Systems  All other systems reviewed and are negative.      Objective:    Physical Exam Vitals and nursing note reviewed.  Constitutional:      Appearance: Normal appearance. She is well-developed and well-groomed.  HENT:     Head: Normocephalic.     Right Ear: External ear normal.     Left Ear: External ear normal.     Nose: Nose normal.     Mouth/Throat:     Lips: Pink.     Mouth: Mucous membranes are moist.     Pharynx: Oropharynx is clear.  Eyes:     General: Lids are normal. Lids are everted, no foreign bodies appreciated.  Neck:     Thyroid: No thyroid mass, thyromegaly or thyroid tenderness.  Cardiovascular:     Rate and Rhythm: Normal rate and regular rhythm.     Pulses: Normal pulses.     Heart sounds: Normal heart sounds, S1 normal and S2 normal.  Pulmonary:     Effort: Pulmonary effort is normal.     Breath sounds: Normal breath  sounds.  Chest:     Chest wall: No tenderness.  Abdominal:     General: Abdomen is flat. There is no abdominal bruit.     Palpations: Abdomen is soft.     Tenderness: There is no abdominal tenderness.  Musculoskeletal:        General: Normal range of motion.     Cervical back: Normal range of motion and neck supple.     Right lower leg: No edema.     Left lower leg: No edema.  Lymphadenopathy:     Head:     Right side of head: No submental, submandibular, tonsillar, preauricular, posterior auricular or occipital adenopathy.     Left side of head: No submental, submandibular, tonsillar, preauricular, posterior auricular or occipital adenopathy.     Cervical: No cervical adenopathy.  Skin:    General: Skin is warm and dry.     Capillary Refill: Capillary refill takes less than 2 seconds.  Neurological:     General: No focal deficit present.     Mental Status: She is alert and oriented to person, place, and time.  Psychiatric:        Attention and Perception: Attention normal.        Mood and Affect: Mood normal.        Speech: Speech normal.        Behavior: Behavior normal. Behavior is cooperative.        Cognition and Memory: Cognition normal.  Judgment: Judgment normal.     BP 110/72 (BP Location: Left Arm, Patient Position: Sitting, Cuff Size: Normal)   Pulse 82   Temp 97.8 F (36.6 C) (Temporal)   Resp 18   Ht 5\' 6"  (1.676 m)   Wt 196 lb (88.9 kg)   SpO2 97%   BMI 31.64 kg/m  Wt Readings from Last 3 Encounters:  10/04/19 196 lb (88.9 kg)  09/11/19 190 lb (86.2 kg)  04/12/19 192 lb (87.1 kg)    Lab Results  Component Value Date   TSH 0.79 03/15/2019   Lab Results  Component Value Date   WBC 7.6 09/11/2019   HGB 12.3 09/11/2019   HCT 36.4 09/11/2019   MCV 83.1 09/11/2019   PLT 389 09/11/2019   Lab Results  Component Value Date   NA 138 09/11/2019   K 3.7 09/11/2019   CO2 23 09/11/2019   GLUCOSE 115 (H) 09/11/2019   BUN 16 09/11/2019    CREATININE 1.00 09/11/2019   BILITOT 1.0 09/11/2019   ALKPHOS 87 09/11/2019   AST 21 09/11/2019   ALT 31 09/11/2019   PROT 7.4 09/11/2019   ALBUMIN 3.8 09/11/2019   CALCIUM 9.1 09/11/2019   ANIONGAP 7 09/11/2019   Lab Results  Component Value Date   CHOL 161 01/15/2019   Lab Results  Component Value Date   HDL 62 01/15/2019   Lab Results  Component Value Date   LDLCALC 76 01/15/2019   Lab Results  Component Value Date   TRIG 157 (H) 01/15/2019   Lab Results  Component Value Date   CHOLHDL 2.6 01/15/2019   Lab Results  Component Value Date   HGBA1C 5.5 08/18/2018       Assessment & Plan:   Problem List Items Addressed This Visit      Endocrine   Hypothyroidism - Primary   Relevant Orders   Urinalysis, Routine w reflex microscopic   T4, free   TSH   Hemoglobin A1c    Other Visit Diagnoses    Fatigue, unspecified type       Relevant Orders   Urinalysis, Routine w reflex microscopic   T4, free   TSH   Hemoglobin A1c   Elevated glucose       Relevant Orders   Urinalysis, Routine w reflex microscopic   T4, free   TSH   Hemoglobin A1c     U/A completed r/t sxs: abd discomfort with h/o urostomy bag, and fatigued pending result. Her last clinic visit with labs was on 04/2019. Will obtain A1C for elevated glucose trend, Thyroid panel r/t length of time since labs, sxs, and h/o hypothyroid.    Follow Up: as scheduled and as needed for non resolving or worsening sxs.   Annie Main, FNP

## 2019-10-05 LAB — URINALYSIS, ROUTINE W REFLEX MICROSCOPIC
Bilirubin Urine: NEGATIVE
Glucose, UA: NEGATIVE
Ketones, ur: NEGATIVE
Nitrite: POSITIVE — AB
Specific Gravity, Urine: 1.014 (ref 1.001–1.03)
Squamous Epithelial / HPF: NONE SEEN /HPF (ref <=5)
WBC, UA: >=60 /HPF — AB (ref 0–5)
pH: 7 (ref 5.0–8.0)

## 2019-10-05 LAB — TSH: TSH: 1.63 mIU/L (ref 0.40–4.50)

## 2019-10-05 LAB — HEMOGLOBIN A1C
Hgb A1c MFr Bld: 5.8 % of total Hgb — ABNORMAL HIGH (ref <5.7)
Mean Plasma Glucose: 120 (calc)
eAG (mmol/L): 6.6 (calc)

## 2019-10-05 LAB — T4, FREE: Free T4: 1.3 ng/dL (ref 0.8–1.8)

## 2019-10-07 ENCOUNTER — Telehealth: Payer: Self-pay | Admitting: Family Medicine

## 2019-10-07 NOTE — Telephone Encounter (Signed)
PATIENT CALLING TO GET HER LAB WORK RESULTS, NOTHING ATTACHED? [phone]

## 2019-10-08 ENCOUNTER — Other Ambulatory Visit: Payer: Self-pay

## 2019-10-08 MED ORDER — CIPROFLOXACIN HCL 500 MG PO TABS: 500.0000 mg | ORAL_TABLET | Freq: Two times a day (BID) | ORAL | 0 refills | Status: DC

## 2019-10-08 NOTE — Telephone Encounter (Signed)
Pt notified. Verbalizes understanding. Rx sent to pharmacy.

## 2019-10-08 NOTE — Telephone Encounter (Signed)
   She has borderline DM, A1C 5.8%, watch the sweets, no meds needed    Thyroid is normal    No urine culture sent, if still symptomatic  with fatigue abd pain, send in Cipro 500mg  BID x 7 days

## 2019-10-15 ENCOUNTER — Other Ambulatory Visit: Payer: Self-pay | Admitting: *Deleted

## 2019-10-15 MED ORDER — POTASSIUM CHLORIDE CRYS ER 20 MEQ PO TBCR: 20.0000 meq | EXTENDED_RELEASE_TABLET | Freq: Every day | ORAL | 3 refills | Status: DC

## 2019-10-20 ENCOUNTER — Other Ambulatory Visit: Payer: Self-pay | Admitting: *Deleted

## 2019-10-20 MED ORDER — DIPHENOXYLATE-ATROPINE 2.5-0.025 MG PO TABS: 1.0000 | ORAL_TABLET | Freq: Four times a day (QID) | ORAL | 2 refills | Status: DC | PRN

## 2019-10-20 MED ORDER — MORPHINE SULFATE ER 15 MG PO TBCR: 15.0000 mg | EXTENDED_RELEASE_TABLET | Freq: Two times a day (BID) | ORAL | 0 refills | Status: DC

## 2019-10-20 MED ORDER — OXYCODONE HCL 5 MG PO TABS: 5.0000 mg | ORAL_TABLET | Freq: Three times a day (TID) | ORAL | 0 refills | Status: DC | PRN

## 2019-10-20 NOTE — Telephone Encounter (Signed)
Received call from patient.   Requested refill on Oxycodone/ MS Contin/ Lomotil.   Ok to refill??  Last office visit 10/07/2019.  Last refill on oxycodone/ MS Contin 09/20/2019.  Last refill on Lomotil 06/21/2019.

## 2019-10-25 ENCOUNTER — Other Ambulatory Visit: Payer: Self-pay | Admitting: *Deleted

## 2019-10-25 DIAGNOSIS — C679 Malignant neoplasm of bladder, unspecified: Secondary | ICD-10-CM | POA: Diagnosis not present

## 2019-10-25 DIAGNOSIS — Z936 Other artificial openings of urinary tract status: Secondary | ICD-10-CM | POA: Diagnosis not present

## 2019-10-25 MED ORDER — HYDROCORTISONE 2.5 % EX CREA: TOPICAL_CREAM | CUTANEOUS | 11 refills | Status: DC

## 2019-11-09 ENCOUNTER — Ambulatory Visit (INDEPENDENT_AMBULATORY_CARE_PROVIDER_SITE_OTHER): Payer: Medicare Other | Admitting: Family Medicine

## 2019-11-09 ENCOUNTER — Encounter: Payer: Self-pay | Admitting: Family Medicine

## 2019-11-09 ENCOUNTER — Other Ambulatory Visit: Payer: Self-pay

## 2019-11-09 VITALS — BP 134/78 | HR 80 | Temp 97.9°F | Resp 16 | Ht 66.0 in | Wt 186.0 lb

## 2019-11-09 DIAGNOSIS — R6889 Other general symptoms and signs: Secondary | ICD-10-CM | POA: Diagnosis not present

## 2019-11-09 DIAGNOSIS — K6289 Other specified diseases of anus and rectum: Secondary | ICD-10-CM | POA: Diagnosis not present

## 2019-11-09 DIAGNOSIS — G8929 Other chronic pain: Secondary | ICD-10-CM

## 2019-11-09 DIAGNOSIS — R252 Cramp and spasm: Secondary | ICD-10-CM

## 2019-11-09 DIAGNOSIS — R7303 Prediabetes: Secondary | ICD-10-CM

## 2019-11-09 DIAGNOSIS — E039 Hypothyroidism, unspecified: Secondary | ICD-10-CM | POA: Diagnosis not present

## 2019-11-09 DIAGNOSIS — I10 Essential (primary) hypertension: Secondary | ICD-10-CM

## 2019-11-09 DIAGNOSIS — D509 Iron deficiency anemia, unspecified: Secondary | ICD-10-CM

## 2019-11-09 DIAGNOSIS — R682 Dry mouth, unspecified: Secondary | ICD-10-CM

## 2019-11-09 NOTE — Assessment & Plan Note (Signed)
Recent TFT at goal with regards of fatigue Check B12, Iron levels

## 2019-11-09 NOTE — Patient Instructions (Signed)
Try biotin rinse for dry mouth  We will call with lab results  Keep the GI appointment for diarrhea  F/U  3 months for Physical

## 2019-11-09 NOTE — Progress Notes (Signed)
Subjective:    Patient ID: [name], female    DOB: [birth year], 70 y.o.   MRN: [id number]  Patient presents for Cramps to B Feet (x3 weeks- cramps to the bottom of her feet in AM), Dry Mouth (x1 week- feels like there is no moisture in mouth at all, thinks it's causingher some issues breahting at night), and Arm Cramps (x4 months- R AC- states that she has pain in crease of arm where labs are always obtained)  Pt here with multiple concerns  Meds reviewed    Cramps in her feet for 3 weeks and her arms for 4 months   arm pain is where she gets her blood drwn, but no swelling, no redness seen   first thing in the morning has cramps , during the day is okay in both spots, no redness, no swelling, no new joint pain, just cramps She admits she has been taking lasix regulary, compared to before    Dry Mouth- for the past week, feels like she has cotton mouth, no sinus drainage, no sore throat, sipping water all night No cough, no difficulty breathing, worried dry mouth would affect her breathing    She has chronic diarrhea has appt with in June, she is taking immodium and lomotil   her doctor in [place] is aware    Borderline DM-  A1C 5.8%    HISTORY OF LOW magnsum - on mag supplement   Due for B12 and iron levels   She mixes 1 can of soda in 5 bottles of water to hydrate herself during the day  Review Of Systems:  GEN- denies fatigue, fever, weight loss,weakness, recent illness HEENT- denies eye drainage, change in vision, nasal discharge, CVS- denies chest pain, palpitations RESP- denies SOB, cough, wheeze ABD- denies N/V, +change in stools, abd pain GU- denies dysuria, hematuria, dribbling, incontinence MSK- denies joint pain, +muscle aches, injury Neuro- denies headache, dizziness, syncope, seizure activity       Objective:    BP 134/78   Pulse 80   Temp 97.9 F (36.6 C) (Temporal)   Resp 16   Ht 5\' 6"  (1.676 m)   Wt 186 lb (84.4 kg)   SpO2 98%   BMI 30.02  kg/m  GEN- NAD, alert and oriented x3 HEENT- PERRL, EOMI, non injected sclera, pink conjunctiva, MMM, oropharynx clear Neck- Supple, no thyromegaly, no JVD CVS- RRR, no murmur RESP-CTAB ABD-NABS,soft,NT,ND, ostomy in tact EXT- pedal edema Pulses- Radial, DP- 2+        Assessment & Plan:      Problem List Items Addressed This Visit      Unprioritized   Borderline diabetes    Discussed monitoring sugar intake   Cramps in hands and feet, may have some mild dehydration to muscles, no new joint pain or swelling Check K level, she has been drinking water with soda for flavoring, can switch to watered down gaterade, she feels diarrhea is worse if she drinks gaterade alone      Relevant Orders   CBC with Differential/Platelet   Comprehensive metabolic panel   Chronic rectal pain    Chronic pain meds, no change to doses F/u GI for chronic rectal pain with chronic diarrhea I will check mag level is normal, recommend reducing or coming off mag, due to diarhea      Essential hypertension    Controlled , no changes      Hypothyroidism    Recent TFT at goal with regards of fatigue  Check B12, Iron levels      Microcytic anemia   Relevant Orders   CBC with Differential/Platelet   Iron, TIBC and Ferritin Panel   Vitamin B12    Other Visit Diagnoses    Muscle cramps    -  Primary   per below, check lytes, renal function, she has been taking lasix regulary   Hypomagnesemia       Relevant Orders   Magnesium   Dry mouth       medication effect, can try biotin rinse, lomotil, lasix, bp meds can all contribute       Note: This dictation was prepared with Dragon dictation along with smaller phrase technology. Any transcriptional errors that result from this process are unintentional.

## 2019-11-09 NOTE — Assessment & Plan Note (Signed)
Controlled, no changes. 

## 2019-11-09 NOTE — Assessment & Plan Note (Signed)
Discussed monitoring sugar intake   Cramps in hands and feet, may have some mild dehydration to muscles, no new joint pain or swelling Check K level, she has been drinking water with soda for flavoring, can switch to watered down gaterade, she feels diarrhea is worse if she drinks gaterade alone

## 2019-11-09 NOTE — Assessment & Plan Note (Signed)
Chronic pain meds, no change to doses F/u GI for chronic rectal pain with chronic diarrhea I will check mag level is normal, recommend reducing or coming off mag, due to diarhea

## 2019-11-12 ENCOUNTER — Other Ambulatory Visit: Payer: Medicare Other

## 2019-11-12 ENCOUNTER — Other Ambulatory Visit: Payer: Self-pay

## 2019-11-13 LAB — CBC WITH DIFFERENTIAL/PLATELET
Absolute Monocytes: 488 cells/uL (ref 200–950)
Basophils Absolute: 30 cells/uL (ref 0–200)
Basophils Relative: 0.4 %
Eosinophils Absolute: 148 cells/uL (ref 15–500)
Eosinophils Relative: 2 %
HCT: 36.5 % (ref 35.0–45.0)
Hemoglobin: 12.1 g/dL (ref 11.7–15.5)
Lymphs Abs: 1724 cells/uL (ref 850–3900)
MCH: 27.9 pg (ref 27.0–33.0)
MCHC: 33.2 g/dL (ref 32.0–36.0)
MCV: 84.1 fL (ref 80.0–100.0)
MPV: 10.3 fL (ref 7.5–12.5)
Monocytes Relative: 6.6 %
Neutro Abs: 5010 cells/uL (ref 1500–7800)
Neutrophils Relative %: 67.7 %
Platelets: 394 10*3/uL (ref 140–400)
RBC: 4.34 10*6/uL (ref 3.80–5.10)
RDW: 15.3 % — ABNORMAL HIGH (ref 11.0–15.0)
Total Lymphocyte: 23.3 %
WBC: 7.4 10*3/uL (ref 3.8–10.8)

## 2019-11-13 LAB — IRON,TIBC AND FERRITIN PANEL
%SAT: 16 % (calc) (ref 16–45)
Ferritin: 73 ng/mL (ref 16–288)
Iron: 44 ug/dL — ABNORMAL LOW (ref 45–160)
TIBC: 282 mcg/dL (calc) (ref 250–450)

## 2019-11-13 LAB — COMPREHENSIVE METABOLIC PANEL
AG Ratio: 1.4 (calc) (ref 1.0–2.5)
ALT: 18 U/L (ref 6–29)
AST: 17 U/L (ref 10–35)
Albumin: 3.8 g/dL (ref 3.6–5.1)
Alkaline phosphatase (APISO): 99 U/L (ref 37–153)
BUN/Creatinine Ratio: 14 (calc) (ref 6–22)
BUN: 14 mg/dL (ref 7–25)
CO2: 24 mmol/L (ref 20–32)
Calcium: 8.5 mg/dL — ABNORMAL LOW (ref 8.6–10.4)
Chloride: 106 mmol/L (ref 98–110)
Creat: 1 mg/dL — ABNORMAL HIGH (ref 0.50–0.99)
Globulin: 2.7 g/dL (calc) (ref 1.9–3.7)
Glucose, Bld: 93 mg/dL (ref 65–99)
Potassium: 3.7 mmol/L (ref 3.5–5.3)
Sodium: 137 mmol/L (ref 135–146)
Total Bilirubin: 0.7 mg/dL (ref 0.2–1.2)
Total Protein: 6.5 g/dL (ref 6.1–8.1)

## 2019-11-13 LAB — MAGNESIUM: Magnesium: 0.8 mg/dL — CL (ref 1.5–2.5)

## 2019-11-13 LAB — VITAMIN B12: Vitamin B-12: 150 pg/mL — ABNORMAL LOW (ref 200–1100)

## 2019-11-15 ENCOUNTER — Telehealth: Payer: Self-pay | Admitting: *Deleted

## 2019-11-15 ENCOUNTER — Other Ambulatory Visit: Payer: Self-pay

## 2019-11-15 ENCOUNTER — Other Ambulatory Visit: Payer: Self-pay | Admitting: Family Medicine

## 2019-11-15 ENCOUNTER — Ambulatory Visit (INDEPENDENT_AMBULATORY_CARE_PROVIDER_SITE_OTHER): Payer: Medicare Other

## 2019-11-15 DIAGNOSIS — E538 Deficiency of other specified B group vitamins: Secondary | ICD-10-CM

## 2019-11-15 DIAGNOSIS — E038 Other specified hypothyroidism: Secondary | ICD-10-CM

## 2019-11-15 MED ORDER — CYANOCOBALAMIN 1000 MCG/ML IJ SOLN
1000.0000 ug | INTRAMUSCULAR | Status: DC
  Administered 2019-11-15 – 2020-08-15 (×9): 1000 ug via INTRAMUSCULAR

## 2019-11-15 NOTE — Telephone Encounter (Signed)
Received call from patient.   Reports that she was advised by PCP to come to office for Vit B injection.  Inquired as to frequency. MD please advise.

## 2019-11-15 NOTE — Telephone Encounter (Signed)
See lab results.  

## 2019-11-19 ENCOUNTER — Other Ambulatory Visit: Payer: Self-pay | Admitting: *Deleted

## 2019-11-19 MED ORDER — OXYCODONE HCL 5 MG PO TABS: 5.0000 mg | ORAL_TABLET | Freq: Three times a day (TID) | ORAL | 0 refills | Status: DC | PRN

## 2019-11-19 MED ORDER — DIPHENOXYLATE-ATROPINE 2.5-0.025 MG PO TABS: 1.0000 | ORAL_TABLET | Freq: Four times a day (QID) | ORAL | 2 refills | Status: DC | PRN

## 2019-11-19 MED ORDER — MORPHINE SULFATE ER 15 MG PO TBCR: 15.0000 mg | EXTENDED_RELEASE_TABLET | Freq: Two times a day (BID) | ORAL | 0 refills | Status: DC

## 2019-11-19 NOTE — Telephone Encounter (Signed)
Received call from patient.   Requested refill on MS Contin, Oxycodone, and Lomotil.   Ok to refill??  Last office visit 11/09/2019.  Last refill 10/20/2019.

## 2019-11-22 ENCOUNTER — Other Ambulatory Visit: Payer: Self-pay | Admitting: Family Medicine

## 2019-12-07 ENCOUNTER — Ambulatory Visit (INDEPENDENT_AMBULATORY_CARE_PROVIDER_SITE_OTHER): Payer: Medicare Other

## 2019-12-07 ENCOUNTER — Other Ambulatory Visit: Payer: Self-pay

## 2019-12-07 DIAGNOSIS — E538 Deficiency of other specified B group vitamins: Secondary | ICD-10-CM

## 2019-12-08 DIAGNOSIS — C679 Malignant neoplasm of bladder, unspecified: Secondary | ICD-10-CM | POA: Diagnosis not present

## 2019-12-08 DIAGNOSIS — Z936 Other artificial openings of urinary tract status: Secondary | ICD-10-CM | POA: Diagnosis not present

## 2019-12-20 ENCOUNTER — Other Ambulatory Visit: Payer: Self-pay | Admitting: *Deleted

## 2019-12-20 MED ORDER — OXYCODONE HCL 5 MG PO TABS: 5.0000 mg | ORAL_TABLET | Freq: Three times a day (TID) | ORAL | 0 refills | Status: DC | PRN

## 2019-12-20 MED ORDER — DIPHENOXYLATE-ATROPINE 2.5-0.025 MG PO TABS: 1.0000 | ORAL_TABLET | Freq: Four times a day (QID) | ORAL | 2 refills | Status: DC | PRN

## 2019-12-20 MED ORDER — MORPHINE SULFATE ER 15 MG PO TBCR: 15.0000 mg | EXTENDED_RELEASE_TABLET | Freq: Two times a day (BID) | ORAL | 0 refills | Status: DC

## 2019-12-20 NOTE — Telephone Encounter (Signed)
Received call from patient.   Requested refill on Lomotil, MS Contin, and Oxycodone.   Ok to refill??  Last office visit 11/09/2019.  Last refill 11/19/2019 on both.

## 2019-12-22 ENCOUNTER — Other Ambulatory Visit: Payer: Self-pay

## 2019-12-22 ENCOUNTER — Encounter: Payer: Self-pay | Admitting: Family Medicine

## 2019-12-22 ENCOUNTER — Ambulatory Visit (INDEPENDENT_AMBULATORY_CARE_PROVIDER_SITE_OTHER): Payer: Medicare Other | Admitting: Family Medicine

## 2019-12-22 VITALS — BP 140/70 | HR 85 | Temp 97.6°F | Wt 184.7 lb

## 2019-12-22 DIAGNOSIS — E539 Vitamin B deficiency, unspecified: Secondary | ICD-10-CM | POA: Diagnosis not present

## 2019-12-22 DIAGNOSIS — M436 Torticollis: Secondary | ICD-10-CM | POA: Diagnosis not present

## 2019-12-22 DIAGNOSIS — E538 Deficiency of other specified B group vitamins: Secondary | ICD-10-CM

## 2019-12-22 DIAGNOSIS — R079 Chest pain, unspecified: Secondary | ICD-10-CM

## 2019-12-22 MED ORDER — DICLOFENAC SODIUM 1 % EX GEL: CUTANEOUS | 1 refills | Status: DC

## 2019-12-22 MED ORDER — TIZANIDINE HCL 4 MG PO TABS: 4.0000 mg | ORAL_TABLET | Freq: Four times a day (QID) | ORAL | 0 refills | Status: DC | PRN

## 2019-12-22 NOTE — Progress Notes (Signed)
   Subjective:    Patient ID: [name], female    DOB: Feb 07, 1951, 69 y.o.   MRN: [id number]  Patient presents for Neck Pain (B12 )  Pt here with nekc pain for the past week.Marland Kitchen She was sleeping on a new fluffier pillow and had pain the next day but didn't think that was the issue, but after sleeping on it a few  Days it worened She cant turn to the side because of neck is stiff She also felt some discomfort beneath her left breast Today, tender to touch No trouble breathing  No  Change in bowels , no urinary symtoms , no rash, no GERD symptoms   B12 shot due      Review Of Systems:  GEN- denies fatigue, fever, weight loss,weakness, recent illness HEENT- denies eye drainage, change in vision, nasal discharge, CVS- denies chest pain, palpitations RESP- denies SOB, cough, wheeze ABD- denies N/V, change in stools, abd pain GU- denies dysuria, hematuria, dribbling, incontinence MSK- + joint pain, +muscle aches, injury Neuro- denies headache, dizziness, syncope, seizure activity       Objective:    BP 140/70   Pulse 85   Temp 97.6 F (36.4 C) (Temporal)   Wt 184 lb 11.2 oz (83.8 kg)   SpO2 98%   BMI 29.81 kg/m  GEN- NAD, alert and oriented x3 HEENT- PERRL, EOMI, non injected sclera, pink conjunctiva, MMM, oropharynx clear Neck- supple, decreased ROM, +spasm, holding head twisted more to left CVS- RRR, no murmur RESP-CTAB MSK- TTP across upper trapeizius, chest wall, TTP beneath left bresat and axilla Fair ROM upper ext , pain in chest wall with reach behind and across chest Skin intact, no rash  ABD-NABS,soft,NT,ND, urstomy in tact  EXT- pedal edema Pulses- Radial 2+   EKG I reviewed/interpreted, NSR no ST changes      Assessment & Plan:      Problem List Items Addressed This Visit    None    Visit Diagnoses    Chest pain, unspecified type    -  Primary - MSK pain    Relevant Orders   EKG 12-Lead (Completed)   Wry neck       MSK spasm after  sleeping on new pillow, chest pain also MSK pain, EKG reassuring  start zanaflex, topical NSAID due to blood thinners, on pain meds, heat and stretch    Vitamin B deficiency    - shot given       Note: This dictation was prepared with Dragon dictation along with smaller phrase technology. Any transcriptional errors that result from this process are unintentional.

## 2019-12-22 NOTE — Patient Instructions (Addendum)
Take muslce relaxer Using heating pad Use topical Voltaren gel  F/U 2-24months for physical

## 2019-12-24 DIAGNOSIS — K529 Noninfective gastroenteritis and colitis, unspecified: Secondary | ICD-10-CM | POA: Diagnosis not present

## 2020-01-06 DIAGNOSIS — Z936 Other artificial openings of urinary tract status: Secondary | ICD-10-CM | POA: Diagnosis not present

## 2020-01-06 DIAGNOSIS — C679 Malignant neoplasm of bladder, unspecified: Secondary | ICD-10-CM | POA: Diagnosis not present

## 2020-01-11 ENCOUNTER — Other Ambulatory Visit: Payer: Self-pay | Admitting: Family Medicine

## 2020-01-14 ENCOUNTER — Other Ambulatory Visit: Payer: Self-pay | Admitting: Family Medicine

## 2020-01-14 MED ORDER — OXYCODONE HCL 5 MG PO TABS: 5.0000 mg | ORAL_TABLET | Freq: Three times a day (TID) | ORAL | 0 refills | Status: DC | PRN

## 2020-01-14 MED ORDER — MORPHINE SULFATE ER 15 MG PO TBCR: 15.0000 mg | EXTENDED_RELEASE_TABLET | Freq: Two times a day (BID) | ORAL | 0 refills | Status: DC

## 2020-01-14 NOTE — Progress Notes (Signed)
Pt called for refill on pain meds meds refilled Recent OV

## 2020-01-24 ENCOUNTER — Other Ambulatory Visit: Payer: Self-pay | Admitting: Family Medicine

## 2020-01-31 ENCOUNTER — Encounter: Payer: Self-pay | Admitting: Family Medicine

## 2020-01-31 ENCOUNTER — Other Ambulatory Visit: Payer: Self-pay

## 2020-01-31 ENCOUNTER — Ambulatory Visit (INDEPENDENT_AMBULATORY_CARE_PROVIDER_SITE_OTHER): Payer: Medicare Other | Admitting: Family Medicine

## 2020-01-31 VITALS — BP 136/74 | HR 76 | Temp 97.7°F | Resp 16 | Ht 66.0 in | Wt 186.0 lb

## 2020-01-31 DIAGNOSIS — E538 Deficiency of other specified B group vitamins: Secondary | ICD-10-CM

## 2020-01-31 DIAGNOSIS — Z8551 Personal history of malignant neoplasm of bladder: Secondary | ICD-10-CM

## 2020-01-31 DIAGNOSIS — K6289 Other specified diseases of anus and rectum: Secondary | ICD-10-CM

## 2020-01-31 DIAGNOSIS — K529 Noninfective gastroenteritis and colitis, unspecified: Secondary | ICD-10-CM | POA: Diagnosis not present

## 2020-01-31 DIAGNOSIS — G8929 Other chronic pain: Secondary | ICD-10-CM

## 2020-01-31 DIAGNOSIS — R7303 Prediabetes: Secondary | ICD-10-CM

## 2020-01-31 DIAGNOSIS — Z936 Other artificial openings of urinary tract status: Secondary | ICD-10-CM

## 2020-01-31 NOTE — Progress Notes (Signed)
Subjective:    Patient ID: [name], female    DOB: [birth year], 69 y.o.   MRN: [id number]  Patient presents for Frequent BM (x2 weeks- intermittently loose)  Chronic diarrhea, has seen multiple speciaist Last was Dr. Lamonte Sakai  she was rescribed a medicine by GI , that reduced BM to 1-2 a day  She was given Colestipol that idnt work, DICYCLOMINE, Lomitil, Immdoium  that didn't hep She was given Rifaximin 550mg  TID for 14 days  This helped her diarrhea significantly, she wanted to stay on the medication. She has not spoken to her GI about the positive effects But now that she is off the med, the diarrhea has started again She is being rescheduled for anal testing She was also concerned her diet was contirbuting, she has been recording her food for the past 2 weeks. In summary ,has lots of high carb foods, breads/sandwhiches, chips for meals   Low mag - taking twice a day, last level low at   0.8  B12 def, missed shot last month    Needs referral to re-establish with her urologist   Review Of Systems:  GEN- denies fatigue, fever, weight loss,weakness, recent illness HEENT- denies eye drainage, change in vision, nasal discharge, CVS- denies chest pain, palpitations RESP- denies SOB, cough, wheeze ABD- denies N/V, +change in stools, abd pain GU- denies dysuria, hematuria, dribbling, incontinence MSK- denies joint pain, muscle aches, injury Neuro- denies headache, dizziness, syncope, seizure activity       Objective:    BP (!) 136/74   Pulse 76   Temp 97.7 F (36.5 C) (Temporal)   Resp 16   Ht 5\' 6"  (1.676 m)   Wt 186 lb (84.4 kg)   SpO2 98%   BMI 30.02 kg/m  GEN- NAD, alert and oriented x3 HEENT- PERRL, EOMI, non injected sclera, pink conjunctiva, MMM, oropharynx clear Neck- Supple, no thyromegaly CVS- RRR, no murmur RESP-CTAB ABD-NABS,soft,NT,ND, urstomy in tact  EXT- No edema Pulses- Radial, DP- 2+        Assessment & Plan:      Problem List Items  Addressed This Visit      Unprioritized   Borderline diabetes   Relevant Orders   CBC with Differential/Platelet   Basic metabolic panel   Amb ref to Medical Nutrition Therapy-MNT   Chronic rectal pain - Primary    Recommend she call her GI back about next steps since the rifaxmin did help I will refer her to dietician but advised I dont think this will cure her chronic diarrhea, but will help her other comorbidites and maybe some of the bloating symptoms Whether she is on mag or not, no changes in diarrhea  B12 shot given  Check Mag level, currently taking BID  Referral to urology for history of bladder cancer and urstomy status      Hx of bladder cancer   Relevant Orders   Ambulatory referral to Urology   Presence of urostomy [facility])   Relevant Orders   Ambulatory referral to Urology   Amb ref to Medical Nutrition Therapy-MNT    Other Visit Diagnoses    Chronic diarrhea       Relevant Orders   Amb ref to Medical Nutrition Therapy-MNT   Hypomagnesemia       Relevant Orders   Basic metabolic panel   Magnesium   B12 deficiency          Note: This dictation was prepared with Dragon dictation along with smaller phrase technology.  Any transcriptional errors that result from this process are unintentional.

## 2020-01-31 NOTE — Patient Instructions (Addendum)
We will call wit lab results Call your Stomach doctor Referral to dietician Referral to urology Dr. Karsten Ro [facility] F/U as previous

## 2020-01-31 NOTE — Assessment & Plan Note (Signed)
Recommend she call her GI back about next steps since the rifaxmin did help I will refer her to dietician but advised I dont think this will cure her chronic diarrhea, but will help her other comorbidites and maybe some of the bloating symptoms Whether she is on mag or not, no changes in diarrhea  B12 shot given  Check Mag level, currently taking BID  Referral to urology for history of bladder cancer and urstomy status

## 2020-02-01 LAB — BASIC METABOLIC PANEL
BUN/Creatinine Ratio: 15 (calc) (ref 6–22)
BUN: 17 mg/dL (ref 7–25)
CO2: 22 mmol/L (ref 20–32)
Calcium: 8.5 mg/dL — ABNORMAL LOW (ref 8.6–10.4)
Chloride: 106 mmol/L (ref 98–110)
Creat: 1.15 mg/dL — ABNORMAL HIGH (ref 0.50–0.99)
Glucose, Bld: 102 mg/dL — ABNORMAL HIGH (ref 65–99)
Potassium: 3.4 mmol/L — ABNORMAL LOW (ref 3.5–5.3)
Sodium: 139 mmol/L (ref 135–146)

## 2020-02-01 LAB — CBC WITH DIFFERENTIAL/PLATELET
Absolute Monocytes: 608 cells/uL (ref 200–950)
Basophils Absolute: 38 cells/uL (ref 0–200)
Basophils Relative: 0.5 %
Eosinophils Absolute: 218 cells/uL (ref 15–500)
Eosinophils Relative: 2.9 %
HCT: 37.7 % (ref 35.0–45.0)
Hemoglobin: 12.1 g/dL (ref 11.7–15.5)
Lymphs Abs: 1455 cells/uL (ref 850–3900)
MCH: 25.9 pg — ABNORMAL LOW (ref 27.0–33.0)
MCHC: 32.1 g/dL (ref 32.0–36.0)
MCV: 80.7 fL (ref 80.0–100.0)
MPV: 10.8 fL (ref 7.5–12.5)
Monocytes Relative: 8.1 %
Neutro Abs: 5183 cells/uL (ref 1500–7800)
Neutrophils Relative %: 69.1 %
Platelets: 338 10*3/uL (ref 140–400)
RBC: 4.67 10*6/uL (ref 3.80–5.10)
RDW: 14.9 % (ref 11.0–15.0)
Total Lymphocyte: 19.4 %
WBC: 7.5 10*3/uL (ref 3.8–10.8)

## 2020-02-01 LAB — MAGNESIUM: Magnesium: 0.9 mg/dL — CL (ref 1.5–2.5)

## 2020-02-02 ENCOUNTER — Other Ambulatory Visit: Payer: Self-pay | Admitting: *Deleted

## 2020-02-02 DIAGNOSIS — E876 Hypokalemia: Secondary | ICD-10-CM

## 2020-02-02 MED ORDER — MAGNESIUM OXIDE -MG SUPPLEMENT 400 (240 MG) MG PO TABS: ORAL_TABLET | ORAL | 2 refills | Status: DC

## 2020-02-04 DIAGNOSIS — C679 Malignant neoplasm of bladder, unspecified: Secondary | ICD-10-CM | POA: Diagnosis not present

## 2020-02-04 DIAGNOSIS — Z936 Other artificial openings of urinary tract status: Secondary | ICD-10-CM | POA: Diagnosis not present

## 2020-02-16 DIAGNOSIS — K6289 Other specified diseases of anus and rectum: Secondary | ICD-10-CM | POA: Diagnosis not present

## 2020-02-18 ENCOUNTER — Other Ambulatory Visit: Payer: Self-pay | Admitting: *Deleted

## 2020-02-18 MED ORDER — MORPHINE SULFATE ER 15 MG PO TBCR: 15.0000 mg | EXTENDED_RELEASE_TABLET | Freq: Two times a day (BID) | ORAL | 0 refills | Status: DC

## 2020-02-18 MED ORDER — OXYCODONE HCL 5 MG PO TABS: 5.0000 mg | ORAL_TABLET | Freq: Three times a day (TID) | ORAL | 0 refills | Status: DC | PRN

## 2020-02-18 NOTE — Telephone Encounter (Signed)
Received call from patient.   Requested refill on Oxycodone and MS Contin.   Ok to refill??  Last office visit 01/31/2020.  Last refill 01/14/2020.

## 2020-02-21 ENCOUNTER — Ambulatory Visit: Payer: Medicare Other

## 2020-02-21 ENCOUNTER — Other Ambulatory Visit: Payer: Self-pay

## 2020-02-28 ENCOUNTER — Ambulatory Visit (INDEPENDENT_AMBULATORY_CARE_PROVIDER_SITE_OTHER): Payer: Medicare Other | Admitting: *Deleted

## 2020-02-28 DIAGNOSIS — E538 Deficiency of other specified B group vitamins: Secondary | ICD-10-CM

## 2020-03-09 ENCOUNTER — Encounter: Payer: Self-pay | Admitting: Nutrition

## 2020-03-09 ENCOUNTER — Other Ambulatory Visit: Payer: Self-pay

## 2020-03-09 ENCOUNTER — Encounter: Payer: Medicare Other | Attending: Family Medicine | Admitting: Nutrition

## 2020-03-09 VITALS — Ht 65.5 in | Wt 189.8 lb

## 2020-03-09 DIAGNOSIS — Z8551 Personal history of malignant neoplasm of bladder: Secondary | ICD-10-CM

## 2020-03-09 DIAGNOSIS — R739 Hyperglycemia, unspecified: Secondary | ICD-10-CM

## 2020-03-09 DIAGNOSIS — K760 Fatty (change of) liver, not elsewhere classified: Secondary | ICD-10-CM | POA: Insufficient documentation

## 2020-03-09 DIAGNOSIS — I10 Essential (primary) hypertension: Secondary | ICD-10-CM | POA: Insufficient documentation

## 2020-03-09 DIAGNOSIS — K529 Noninfective gastroenteritis and colitis, unspecified: Secondary | ICD-10-CM | POA: Insufficient documentation

## 2020-03-09 DIAGNOSIS — E782 Mixed hyperlipidemia: Secondary | ICD-10-CM | POA: Insufficient documentation

## 2020-03-09 NOTE — Patient Instructions (Signed)
Goals  Follow FODMAP guidelines Eat 4-5 small meals per day Drink only water Cut out processed and high sugar, high fat foods; bacon, sausage, sweets, sodas, desserts and junk food/cheetos/chips.  Don't skip meals Try eating banans and applesauce and activa yogurt to see if it will help with bowel habits.  May talk to Pharmacy to see if a probiotic may help.  Keep a food journal daily.

## 2020-03-09 NOTE — Progress Notes (Signed)
  Medical Nutrition Therapy:  Appt start time: 1300 end time:  1400.   Assessment:  Primary concerns today: Numerous bowel movements daily.. She lives by herself. Wid[place]..  Has frequent bowels, 15-20 times per day for about 5 years now.. Was on TPN for three years in 2012.. Surgery in 2016 and has had the bowl problem since then. Recently started slowing down with medication of Zanaflex.   Magnesium has been low and is taking 600 mg 2 times per day.  Zanaflex for slowing down her bowel movements.  She has only taken 2 in the last 2 weeks. Only takes them when her bowels are uncontrollable and then they help slow it down. Bottom is sore from multiple bowel movements. Bottom is irritated and hurts. Wt fluctuates recently   She has kept a food journal. Can't tolerated pizza, greens, lettuce and apples. Some foods she can tolerate at times and then other times not. Willing to follow a FODMAP diet to see if that will help her.    Diet is high in fat, processed foods and low in good quality foods of vegetables and lean meats. Has been drinking a lot of sodas and eating chips and cookies.   Preferred Learning Style:  No preference indicated   Learning Readiness:   Ready  Change in progress   MEDICATIONS:   DIETARY INTAKE:  24-hr recall:  B) bacon, eggs and toast, OJ  D Roast, turnip greens, potatoes,  Chocolate chip cookies with milk.  RB sandwich with Kooliaid  Usual physical activity: ADL  Estimated energy needs: 1500  calories 170  g carbohydrates 112 g protein 42 g fat  Progress Towards Goal(s):  In progress.   Nutritional Diagnosis:  NI-5.11.1 Predicted suboptimal nutrient intake As related to bowel issues and malabsorption   As evidenced by Low potassium, Low magnesium levels and chronic diarrhea >15 times per day.     Intervention:  FODMAP diet. Nutrient dense foods, avoiding high fat, high sugar and processed foods. Drinking only water. Goals  Follow FODMAP  guidelines Eat 4-5 small meals per day Drink only water Cut out processed and high sugar, high fat foods; bacon, sausage, sweets, sodas, desserts and junk food/cheetos/chips.  Don't skip meals Try eating banans and applesauce and activa yogurt to see if it will help with bowel habits.  May talk to Pharmacy to see if a probiotic may help.  Keep a food journal daily.   Teaching Method Utilized:  Visual Auditory Hands on  Handouts given during visit include:  The Plate Method   FODMAP   Barriers to learning/adherence to lifestyle change: none  Demonstrated degree of understanding via:  Teach Back   Monitoring/Evaluation:  Dietary intake, exercise, , and body weight in 1 month(s).

## 2020-03-14 DIAGNOSIS — R198 Other specified symptoms and signs involving the digestive system and abdomen: Secondary | ICD-10-CM | POA: Diagnosis not present

## 2020-03-14 DIAGNOSIS — M6289 Other specified disorders of muscle: Secondary | ICD-10-CM | POA: Diagnosis not present

## 2020-03-15 ENCOUNTER — Ambulatory Visit: Payer: Medicare Other | Admitting: Nutrition

## 2020-03-20 ENCOUNTER — Other Ambulatory Visit: Payer: Self-pay | Admitting: *Deleted

## 2020-03-20 DIAGNOSIS — Z936 Other artificial openings of urinary tract status: Secondary | ICD-10-CM | POA: Diagnosis not present

## 2020-03-20 DIAGNOSIS — C679 Malignant neoplasm of bladder, unspecified: Secondary | ICD-10-CM | POA: Diagnosis not present

## 2020-03-20 MED ORDER — MORPHINE SULFATE ER 15 MG PO TBCR: 15.0000 mg | EXTENDED_RELEASE_TABLET | Freq: Two times a day (BID) | ORAL | 0 refills | Status: DC

## 2020-03-20 MED ORDER — OXYCODONE HCL 5 MG PO TABS
5.0000 mg | ORAL_TABLET | Freq: Three times a day (TID) | ORAL | 0 refills | Status: DC | PRN
Start: 2020-03-20 — End: 2020-04-17

## 2020-03-20 NOTE — Telephone Encounter (Signed)
Received call from patient.   Requested refill on Oxycodone and MS Contin.   Ok to refill??  Last office visit 02/28/2020.  Last refill 02/18/2020 on both.

## 2020-03-30 ENCOUNTER — Other Ambulatory Visit: Payer: Self-pay

## 2020-03-30 ENCOUNTER — Encounter: Payer: Self-pay | Admitting: Urology

## 2020-03-30 ENCOUNTER — Ambulatory Visit (INDEPENDENT_AMBULATORY_CARE_PROVIDER_SITE_OTHER): Payer: Medicare Other | Admitting: Urology

## 2020-03-30 VITALS — BP 129/83 | HR 79 | Temp 98.0°F | Ht 65.5 in | Wt 189.8 lb

## 2020-03-30 DIAGNOSIS — N411 Chronic prostatitis: Secondary | ICD-10-CM | POA: Insufficient documentation

## 2020-03-30 DIAGNOSIS — C679 Malignant neoplasm of bladder, unspecified: Secondary | ICD-10-CM

## 2020-03-30 DIAGNOSIS — N3021 Other chronic cystitis with hematuria: Secondary | ICD-10-CM

## 2020-03-30 LAB — URINALYSIS, ROUTINE W REFLEX MICROSCOPIC
Bilirubin, UA: NEGATIVE
Glucose, UA: NEGATIVE
Ketones, UA: NEGATIVE
Nitrite, UA: NEGATIVE
Protein,UA: NEGATIVE
Specific Gravity, UA: 1.015 (ref 1.005–1.030)
Urobilinogen, Ur: 0.2 mg/dL (ref 0.2–1.0)
pH, UA: 5.5 (ref 5.0–7.5)

## 2020-03-30 LAB — MICROSCOPIC EXAMINATION
Renal Epithel, UA: NONE SEEN /hpf
WBC, UA: >30 /hpf — AB (ref 0–5)

## 2020-03-30 NOTE — Patient Instructions (Signed)
Bladder Cancer  Bladder cancer is an abnormal growth of tissue in the bladder. The bladder is the balloon-like sac in the pelvis. It collects and stores urine that comes from the kidneys through the ureters. The bladder wall is made of layers. If cancer spreads into these layers and through the wall of the bladder, it becomes more difficult to treat. What are the causes? The cause of this condition is not known. What increases the risk? The following factors may make you more likely to develop this condition:  Smoking.  Workplace risks (occupational exposures), such as rubber, leather, textile, dyes, chemicals, and paint.  Being white.  Your age. Most people with bladder cancer are over the age of 55.  Being female.  Having chronic bladder inflammation.  Having a personal history of bladder cancer.  Having a family history of bladder cancer (heredity).  Having had chemotherapy or radiation therapy to the pelvis.  Having been exposed to arsenic. What are the signs or symptoms? Initial symptoms of this condition include:  Blood in the urine.  Painful urination.  Frequent bladder or urine infections.  Increase in urgency and frequency of urination. Advanced symptoms of this condition include:  Not being able to urinate.  Low back pain on one side.  Loss of appetite.  Weight loss.  Fatigue.  Swelling in the feet.  Bone pain. How is this diagnosed? This condition is diagnosed based on your medical history, a physical exam, urine tests, lab tests, imaging tests, and your symptoms. You may also have other tests or procedures done, such as:  A narrow tube being inserted into your bladder through your urethra (cystoscopy) in order to view the lining of your bladder for tumors.  A biopsy to sample the tumor to see if cancer is present. If cancer is present, it will then be staged to determine its severity and extent. Staging is an assessment of:  The size of the  tumor.  Whether the cancer has spread.  Where the cancer has spread. It is important to know how deeply into the bladder wall cancer has grown and whether cancer has spread to any other parts of your body. Staging may require blood tests or imaging tests, such as a CT scan, MRI, bone scan, or chest X-ray. How is this treated? Based on the stage of cancer, one treatment or a combination of treatments may be recommended. The most common forms of treatment are:  Surgery to remove the cancer. Procedures that may be done include transurethral resection and cystectomy.  Radiation therapy. This is high-energy X-rays or other particles. This is often used in combination with chemotherapy.  Chemotherapy. During this treatment, medicines are used to kill cancer cells.  Immunotherapy. This uses medicines to help your own immune system destroy cancer cells. Follow these instructions at home:  Take over-the-counter and prescription medicines only as told by your health care provider.  Maintain a healthy diet. Some of your treatments might affect your appetite.  Consider joining a support group. This may help you learn to cope with the stress of having bladder cancer.  Tell your cancer care team if you develop side effects. They may be able to recommend ways to relieve them.  Keep all follow-up visits as told by your health care provider. This is important. Where to find more information  American Cancer Society: www.cancer.org  National Cancer Institute (NCI): www.cancer.gov Contact a health care provider if:  You have symptoms of a urinary tract infection. These include: ?   Fever. ? Chills. ? Weakness. ? Muscle aches. ? Abdominal pain. ? Frequent and intense urge to urinate. ? Burning feeling in the bladder or urethra during urination. Get help right away if:  There is blood in your urine.  You cannot urinate.  You have severe pain or other symptoms that do not go  away. Summary  Bladder cancer is an abnormal growth of tissue in the bladder.  This condition is diagnosed based on your medical history, a physical exam, urine tests, lab tests, imaging tests, and your symptoms.  Based on the stage of cancer, surgery, chemotherapy, or a combination of treatments may be recommended.  Consider joining a support group. This may help you learn to cope with the stress of having bladder cancer. This information is not intended to replace advice given to you by your health care provider. Make sure you discuss any questions you have with your health care provider. Document Revised: 05/30/2017 Document Reviewed: 05/21/2016 Elsevier Patient Education  2020 Elsevier Inc.  

## 2020-03-30 NOTE — Progress Notes (Signed)
Urological Symptom Review  Patient is experiencing the following symptoms: Urinary tract infection Injury to kidneys/bladder   Review of Systems  Gastrointestinal (upper)  : Nausea  Gastrointestinal (lower) : Negative for lower GI symptoms  Constitutional : Fatigue  Skin: Itching  Eyes: Negative for eye symptoms  Ear/Nose/Throat : Negative for Ear/Nose/Throat symptoms  Hematologic/Lymphatic: Negative for Hematologic/Lymphatic symptoms  Cardiovascular : Leg swelling  Respiratory : Negative for respiratory symptoms  Endocrine: Negative for endocrine symptoms  Musculoskeletal: Negative for musculoskeletal symptoms  Neurological: Negative for neurological symptoms  Psychologic: Negative for psychiatric symptoms

## 2020-03-30 NOTE — Progress Notes (Signed)
03/30/2020 10:34 AM   [name] 12-29-1950 [id number]  Referring provider: Alycia Rossetti, MD [address] [place],   [address]  Followup bladder cancer  HPI: [name] is a 53IR here for followup for bladder cancer and chronic cystitis. No UTIs since last visit. She was last seen in 2019 by Dr. Karsten Ro. No hematuria. No recent abdominal imaging.  She had a radical cystectomy in 2010.   Here records from [facility] are as follows:  had bladder cancer that has been treated.  HPI: [name] is a 69 year-old female established patient who is here for follow-up of bladder cancer.  Her bladder cancer was not superficial and limitied to the bladder lining. Her bladder cancer was muscle invasive.   She did have a TURBT. She has had the following number of bladder resections: 1. She did have radiation to treat her bladder cancer. She had treatment with the following intravesical agents: BCG. Patient denies Mitomycin, Interferon, Adriamycin, and Epirubicin.   She has not had blood in her urine recently. The patient denies any progressive voiding symptoms. She is not having new bone pain. She has not recently had unwanted weight loss.   Her last radiologic test to evaluate the kidneys was 12/11/2016.   02/18/17: She is seen for evaluation of urinary tract infections. She has an ileal conduit with a history of a fistula to her conduit in the past.  She is also received chemoradiation and has been followed for her bladder cancer at [facility] Until 2012 when she developed a fistula and was told that nothing more could be done in that she would need to go home and eventually succumbed to lack of calories. At that time she went to [facility] and was started on TPN which she remained on for 3 years. Eventually in 2015 she underwent surgical correction of her intestinal obstruction and her fistula and then remained on feeding tube. She had been followed there but has not been seen for some time.  She did just have a recent CT scan and an with contrast. She is not having any difficulty with her ostomy. She denies any problems with infections. She is having frequent bowel movements and also has chronic pain.   09/09/17: A recent urine culture done on 09/01/17 was positive for E. coli. This was however obtained from her ileostomy bag. She reports that she was admitted to the hospital overnight in [place] where she was diagnosed with what sounds like an extended spectrum E coli UTI. She was maintained on antibiotics daily through a PICC line for 10 days as an outpatient. She was having fatigue as her only symptom. No other symptoms such as fever, hematuria or flank pain. Her fatigue has now resolved but a repeat urine culture showed the continued presence of bacteria and she was sent to me.     CC/HPI: 09/09/17. She also reported developing some redness beneath the lower border of her ileostomy way for. This is only been a problem for a short while. She said it really does not give her a lot of problems except when she takes the way for off every 2-3 days and then it is slightly uncomfortable.     PMH: Past Medical History:  Diagnosis Date  . Acid reflux   . Bowel obstruction (HCC) several   Recurrent SBO secondary to adhesions  . Cancer [facility] 2011 [facility])   diagnosed in 2009 per pt. Invasive High grade Urothelial carcinoma- s/p radiation  and Chemo   . Chronic back pain   . DVT (deep venous thrombosis) ([place]) 2015  . Fatty liver   . Gout   . Hyperlipidemia   . Hypertension    pt says taken off medication since has lost weight.  . Hypothyroidism   . Internal hemorrhoids   . Leukopenia 12/06/2011   HIV serology negative  . Malnutrition ([place])   . PE (pulmonary embolism)    PER [facility] records  . Rectal ulcer 2016   [facility]  . UTI (lower urinary tract infection)    Recurrent    Surgical History: Past Surgical History:  Procedure Laterality Date  . ABDOMINAL SURGERY      with intestinal "puncture" x 2, exploratory surgery   . BACK SURGERY     X2  . BLADDER REMOVAL    . CHOLECYSTECTOMY    . CYSTECTOMY     breast  . HERNIA REPAIR     mesh  . ILEO CONDUIT     For bladder cancer  . PORTACATH PLACEMENT      Home Medications:  Allergies as of 03/30/2020      Reactions   Bactrim [sulfamethoxazole-trimethoprim]    Codeine Hives   Lactose Diarrhea   Nitrofuran Derivatives Itching   Sulfa Antibiotics Hives   Penicillins Rash   Has patient had a PCN reaction causing immediate rash, facial/tongue/throat swelling, SOB or lightheadedness with hypotension: No Has patient had a PCN reaction causing severe rash involving mucus membranes or skin necrosis: No Has patient had a PCN reaction that required hospitalization: No Has patient had a PCN reaction occurring within the last 10 years: No If all of the above answers are "NO", then may proceed with Cephalosporin use. Childhood rash - no adult reactions known      Medication List       Accurate as of March 30, 2020 10:34 AM. If you have any questions, ask your nurse or doctor.        colestipol 1 g tablet Commonly known as: COLESTID Take by mouth.   Dexilant 60 MG capsule Generic drug: dexlansoprazole TAKE 1 CAPSULE BY MOUTH  DAILY   diclofenac Sodium 1 % Gel Commonly known as: VOLTAREN APPLY TO AFFECTED AREAS 3 TIMES DAILY AS NEEDED.   diphenoxylate-atropine 2.5-0.025 MG tablet Commonly known as: Lomotil Take 1 tablet by mouth 4 (four) times daily as needed for diarrhea or loose stools.   Eliquis 5 MG Tabs tablet Generic drug: apixaban TAKE 1 TABLET BY MOUTH  TWICE DAILY   furosemide 40 MG tablet Commonly known as: LASIX TAKE ONE TABLET BY MOUTH  TWICE DAILY AS NEEDED FOR  FLUID   hydrocortisone 2.5 % cream APPLY RECTALLY TWICE DAILY AS NEEDED- please dispense with rectal tip   levothyroxine 150 MCG tablet Commonly known as: SYNTHROID TAKE 1 TABLET BY MOUTH  DAILY BEFORE  BREAKFAST   lidocaine 5 % ointment Commonly known as: XYLOCAINE APPLY TOPICALLY 4 TIMES  DAILY AS NEEDED   Magnesium Oxide 400 (240 Mg) MG Tabs Take 1.5 tablet BID   magnesium oxide 400 MG tablet Commonly known as: MAG-OX Take by mouth.   morphine 15 MG 12 hr tablet Commonly known as: Charlene CONTIN Take 1 tablet (15 mg total) by mouth every 12 (twelve) hours.   Multi-Vitamin tablet Take 2 tablets by mouth daily.   Multivitamin Adult (Minerals) Tabs Take 1 tablet by mouth daily.   MULTIVITAMIN PO Take 1 tablet by mouth daily.   ondansetron 8 MG  disintegrating tablet Commonly known as: ZOFRAN-ODT DISSOLVE 1 TABLET ON THE  TONGUE EVERY 8 HOURS AS  NEEDED FOR NAUSEA   oxyCODONE 5 MG immediate release tablet Commonly known as: Oxy IR/ROXICODONE Take 1 tablet (5 mg total) by mouth every 8 (eight) hours as needed for severe pain.   potassium chloride SA 20 MEQ tablet Commonly known as: Klor-Con M20 Take 1 tablet (20 mEq total) by mouth daily.   pravastatin 20 MG tablet Commonly known as: PRAVACHOL TAKE 1 TABLET BY MOUTH  DAILY FOR CHOLESTEROL   tiZANidine 4 MG tablet Commonly known as: Zanaflex Take 1 tablet (4 mg total) by mouth every 6 (six) hours as needed for muscle spasms.   VITAMIN C PO Take 1 tablet by mouth daily.   Vitamin D2 50 MCG (2000 UT) Tabs tAKE 1 Tablet daily   Xifaxan 550 MG Tabs tablet Generic drug: rifaximin Take 550 mg by mouth 3 (three) times daily.       Allergies:  Allergies  Allergen Reactions  . Bactrim [Sulfamethoxazole-Trimethoprim]   . Codeine Hives  . Lactose Diarrhea  . Nitrofuran Derivatives Itching  . Sulfa Antibiotics Hives  . Penicillins Rash    Has patient had a PCN reaction causing immediate rash, facial/tongue/throat swelling, SOB or lightheadedness with hypotension: No Has patient had a PCN reaction causing severe rash involving mucus membranes or skin necrosis: No Has patient had a PCN reaction that required  hospitalization: No Has patient had a PCN reaction occurring within the last 10 years: No If all of the above answers are "NO", then may proceed with Cephalosporin use.  Childhood rash - no adult reactions known    Family History: Family History  Problem Relation Age of Onset  . Heart disease Mother        enlarged  . Hypertension Mother   . Stroke Mother   . Diabetes Father   . Diabetes Sister   . Diabetes Maternal Grandmother   . Colon cancer Neg Hx     Social History:  reports that she quit smoking about 3 years ago. Her smoking use included cigarettes. She has a 12.50 pack-year smoking history. She has never used smokeless tobacco. She reports that she does not drink alcohol and does not use drugs.  ROS: All other review of systems were reviewed and are negative except what is noted above in HPI  Physical Exam: BP 129/83   Pulse 79   Temp 98 F (36.7 C)   Ht 5' 5.5" (1.664 m)   Wt 189 lb 12.8 oz (86.1 kg)   BMI 31.10 kg/m   Constitutional:  Alert and oriented, No acute distress. HEENT:  AT, moist mucus membranes.  Trachea midline, no masses. Cardiovascular: No clubbing, cyanosis, or edema. Respiratory: Normal respiratory effort, no increased work of breathing. GI: Abdomen is soft, nontender, nondistended, no abdominal masses GU: No CVA tenderness.  Lymph: No cervical or inguinal lymphadenopathy. Skin: No rashes, bruises or suspicious lesions. Neurologic: Grossly intact, no focal deficits, moving all 4 extremities. Psychiatric: Normal mood and affect.  Laboratory Data: Lab Results  Component Value Date   WBC 7.5 01/31/2020   HGB 12.1 01/31/2020   HCT 37.7 01/31/2020   MCV 80.7 01/31/2020   PLT 338 01/31/2020    Lab Results  Component Value Date   CREATININE 1.15 (H) 01/31/2020    No results found for: PSA  No results found for: TESTOSTERONE  Lab Results  Component Value Date   HGBA1C 5.8 (H) 10/04/2019  Urinalysis    Component Value  Date/Time   COLORURINE YELLOW 10/04/2019 1523   APPEARANCEUR CLEAR 10/04/2019 1523   LABSPEC 1.014 10/04/2019 1523   PHURINE 7.0 10/04/2019 1523   GLUCOSEU NEGATIVE 10/04/2019 1523   HGBUR 2+ (A) 10/04/2019 1523   HGBUR small 05/22/2007 1334   BILIRUBINUR NEGATIVE 10/13/2017 1750   BILIRUBINUR negative 09/22/2012 1710   KETONESUR NEGATIVE 10/04/2019 1523   PROTEINUR 1+ (A) 10/04/2019 1523   UROBILINOGEN 0.2 02/09/2014 1435   NITRITE POSITIVE (A) 10/04/2019 1523   LEUKOCYTESUR 3+ (A) 10/04/2019 1523    Lab Results  Component Value Date   BACTERIA MANY (A) 10/04/2019    Pertinent Imaging:  Results for orders placed during the hospital encounter of 12/10/16  DG Abd 1 View  Narrative CLINICAL DATA:  Diarrhea, rectal pain, and gas intermittently for the past 2 years. History of surgical repair of bowel obstruction, urinary tract malignancy treated with radiation therapy and chemotherapy.  EXAM: ABDOMEN - 1 VIEW  COMPARISON:  Abdominopelvic CT scan of Nov 10, 2015  FINDINGS: The colonic stool burden is moderate. There is gas and stool in the rectum. There is no small or large bowel obstructive pattern. The bony structures exhibit no acute abnormalities.  IMPRESSION: No acute intra-abdominal abnormality is observed. Given the patient's history of thickening of the rectal wall, abdominal and pelvic CT scanning would be a useful next imaging step.   Electronically Signed By: David  Martinique M.D. On: 12/10/2016 17:05  No results found for this or any previous visit.  No results found for this or any previous visit.  No results found for this or any previous visit.  No results found for this or any previous visit.  No results found for this or any previous visit.  No results found for this or any previous visit.  No results found for this or any previous visit.   Assessment & Plan:    1. Malignant neoplasm of urinary bladder, unspecified site  (HCC) --BMP -CT abd/pelvis - Urinalysis, Routine w reflex microscopic  2. Chronic cystitis without hematuria -Patient is colonized due to ileal conduit. No issues with symptomatic infection   No follow-ups on file.  Nicolette Bang, MD  [facility] [place]

## 2020-03-31 LAB — BASIC METABOLIC PANEL
BUN/Creatinine Ratio: 14 (ref 12–28)
BUN: 16 mg/dL (ref 8–27)
CO2: 23 mmol/L (ref 20–29)
Calcium: 9.5 mg/dL (ref 8.7–10.3)
Chloride: 103 mmol/L (ref 96–106)
Creatinine, Ser: 1.12 mg/dL — ABNORMAL HIGH (ref 0.57–1.00)
GFR calc Af Amer: 58 mL/min/{1.73_m2} — ABNORMAL LOW (ref >59)
GFR calc non Af Amer: 51 mL/min/{1.73_m2} — ABNORMAL LOW (ref >59)
Glucose: 112 mg/dL — ABNORMAL HIGH (ref 65–99)
Potassium: 3.7 mmol/L (ref 3.5–5.2)
Sodium: 141 mmol/L (ref 134–144)

## 2020-04-04 ENCOUNTER — Ambulatory Visit (INDEPENDENT_AMBULATORY_CARE_PROVIDER_SITE_OTHER): Payer: Medicare Other | Admitting: Family Medicine

## 2020-04-04 ENCOUNTER — Other Ambulatory Visit: Payer: Self-pay

## 2020-04-04 ENCOUNTER — Encounter: Payer: Self-pay | Admitting: Family Medicine

## 2020-04-04 VITALS — BP 130/68 | HR 72 | Temp 98.1°F | Resp 14 | Ht 65.5 in | Wt 194.0 lb

## 2020-04-04 DIAGNOSIS — Z8551 Personal history of malignant neoplasm of bladder: Secondary | ICD-10-CM

## 2020-04-04 DIAGNOSIS — I10 Essential (primary) hypertension: Secondary | ICD-10-CM | POA: Diagnosis not present

## 2020-04-04 DIAGNOSIS — Z0000 Encounter for general adult medical examination without abnormal findings: Secondary | ICD-10-CM

## 2020-04-04 DIAGNOSIS — R7303 Prediabetes: Secondary | ICD-10-CM

## 2020-04-04 DIAGNOSIS — E039 Hypothyroidism, unspecified: Secondary | ICD-10-CM | POA: Diagnosis not present

## 2020-04-04 DIAGNOSIS — Z0001 Encounter for general adult medical examination with abnormal findings: Secondary | ICD-10-CM | POA: Diagnosis not present

## 2020-04-04 DIAGNOSIS — Z936 Other artificial openings of urinary tract status: Secondary | ICD-10-CM

## 2020-04-04 DIAGNOSIS — E538 Deficiency of other specified B group vitamins: Secondary | ICD-10-CM | POA: Diagnosis not present

## 2020-04-04 DIAGNOSIS — Z1231 Encounter for screening mammogram for malignant neoplasm of breast: Secondary | ICD-10-CM

## 2020-04-04 DIAGNOSIS — E782 Mixed hyperlipidemia: Secondary | ICD-10-CM

## 2020-04-04 DIAGNOSIS — M8589 Other specified disorders of bone density and structure, multiple sites: Secondary | ICD-10-CM

## 2020-04-04 DIAGNOSIS — E539 Vitamin B deficiency, unspecified: Secondary | ICD-10-CM | POA: Diagnosis not present

## 2020-04-04 NOTE — Progress Notes (Signed)
Subjective:   Patient presents for Medicare Annual/Subsequent preventive examination.    Pt here for wellness visit, she was seen nephrology, she has [place] scheduled for her Urostomy She is colonized    She was see by Dr. Lamonte Sakai  forher chronic rectal pain/incontinence  at [place]. She had a balloon manometry done. She has pelvic floor dysfunction, and she will have physical therapy in [place]  She has noticed a reduction in her bowel movements, now more constipated   She is following with nutritionist  She is not trying not to eat after 7pm She is trying to follow low FODMAP   Borderline DM last A1C 5.8%  Reviewed recent BMET Cr  1.12    Osteoenpa on vitamiN D   Review Past Medical/Family/Social: pER    Feels hard area on righ tinner buttocks, no drainage, no change in size, present > 1ear   Risk Factors  Current exercise habits: None  Dietary issues discussed: Yes   Cardiac risk factors: Obesity (BMI >= 30 kg/m2). HTN, Borderline DM   Depression Screen  (Note: if answer to either of the following is "Yes", a more complete depression screening is indicated)  Over the past two weeks, have you felt down, depressed or hopeless? No Over the past two weeks, have you felt little interest or pleasure in doing things? No Have you lost interest or pleasure in daily life? No Do you often feel hopeless? No Do you cry easily over simple problems? No   Activities of Daily Living  In your present state of health, do you have any difficulty performing the following activities?:  Driving? No  Managing money? No  Feeding yourself? No  Getting from bed to chair? No  Climbing a flight of stairs? No  Preparing food and eating?: No  Bathing or showering? No  Getting dressed: No  Getting to the toilet? No  Using the toilet:No  Moving around from place to place: No  In the past year have you fallen or had a near fall?:No    Hearing Difficulties: No  Do you often ask people to speak up  or repeat themselves? No  Do you experience ringing or noises in your ears? No Do you have difficulty understanding soft or whispered voices? No  Do you feel that you have a problem with memory? No Do you often misplace items? No  Do you feel safe at home? Yes  Cognitive Testing  Alert? Yes Normal Appearance?Yes  Oriented to person? Yes Place? Yes  Time? Yes  Recall of three objects? Yes  Can perform simple calculations? Yes  Displays appropriate judgment?Yes  Can read the correct time from a watch face?Yes   List the Names of Other Physician/Practitioners you currently use:  GI Dietitian  Urology    Screening Tests / Date Colonoscopy      UTD                Zostavax Declines COVID-19 UTD PNA declines  Mammogram  Due Influenza Vaccine  DECLINES  Tetanus/tdap UTD   ROS:  GEN- denies fatigue, fever, weight loss,weakness, recent illness HEENT- denies eye drainage, change in vision, nasal discharge, CVS- denies chest pain, palpitations RESP- denies SOB, cough, wheeze ABD- denies N/V, change in stools, abd pain GU- denies dysuria, hematuria, dribbling, incontinence MSK- denies joint pain, muscle aches, injury Neuro- denies headache, dizziness, syncope, seizure activity  Physical vitals reviewed  GEN- NAD, alert and oriented x3 HEENT- PERRL, EOMI, non injected sclera, pink conjunctiva, MMM,  oropharynx clear Neck- Supple, no thyromegaly CVS- RRR, no murmur RESP-CTAB ABD-NABS,soft,NT,ND, urstomy in tact  Rectum- hardened tissue with hyperpigmented appearance no specific mass, NT, no fluctance EXT- chronic pedal edema Pulses- Radial, DP- 2+    Assessment:    Annual wellness medicare exam   Plan:    During the course of the visit the patient was educated and counseled about appropriate screening and preventive services including:  Screening mammography due in Nov  Bone Density due in Nov for OSTEOPENIA continue vitamin D   Chronic rectal pain now with Pelvic  floor therapy, per GI  CHRONIC Pain on meds  nothing in gluteal tissue of concern at this time , appears to be a previously chronically inflammed area on the skin  History of bladder cancer with urostomy, followed by urology, colonized with E coli   Borderline DM check A1C, decrease sweets  Hypothyroidism continue replacement, check TSH  Declines flu shot /PNA/   Recommend COVID 19 booster  Due for B12 shot today   Full Code  FALL/DEPRESSION/AUDIT C neg    Diet review for nutrition referral? Yes ____ Not Indicated __x__  Patient Instructions (the written plan) was given to the patient.  Medicare Attestation  I have personally reviewed:  The patient's medical and social history  Their use of alcohol, tobacco or illicit drugs  Their current medications and supplements  The patient's functional ability including ADLs,fall risks, home safety risks, cognitive, and hearing and visual impairment  Diet and physical activities  Evidence for depression or mood disorders  The patient's weight, height, BMI, and visual acuity have been recorded in the chart. I have made referrals, counseling, and provided education to the patient based on review of the above and I have provided the patient with a written personalized care plan for preventive services.

## 2020-04-04 NOTE — Patient Instructions (Addendum)
Schedule Bone Density  For End November  Schedule your Mammogram  For End of November  Get your COVID-19 booster We will call lab results  F/U 4 months

## 2020-04-05 ENCOUNTER — Encounter: Payer: Self-pay | Admitting: Family Medicine

## 2020-04-05 LAB — CBC WITH DIFFERENTIAL/PLATELET
Absolute Monocytes: 766 cells/uL (ref 200–950)
Basophils Absolute: 40 cells/uL (ref 0–200)
Basophils Relative: 0.5 %
Eosinophils Absolute: 221 cells/uL (ref 15–500)
Eosinophils Relative: 2.8 %
HCT: 39.2 % (ref 35.0–45.0)
Hemoglobin: 12.6 g/dL (ref 11.7–15.5)
Lymphs Abs: 1778 cells/uL (ref 850–3900)
MCH: 25.4 pg — ABNORMAL LOW (ref 27.0–33.0)
MCHC: 32.1 g/dL (ref 32.0–36.0)
MCV: 78.9 fL — ABNORMAL LOW (ref 80.0–100.0)
MPV: 10.5 fL (ref 7.5–12.5)
Monocytes Relative: 9.7 %
Neutro Abs: 5096 cells/uL (ref 1500–7800)
Neutrophils Relative %: 64.5 %
Platelets: 333 10*3/uL (ref 140–400)
RBC: 4.97 10*6/uL (ref 3.80–5.10)
RDW: 16.2 % — ABNORMAL HIGH (ref 11.0–15.0)
Total Lymphocyte: 22.5 %
WBC: 7.9 10*3/uL (ref 3.8–10.8)

## 2020-04-05 LAB — COMPREHENSIVE METABOLIC PANEL
AG Ratio: 1.3 (calc) (ref 1.0–2.5)
ALT: 10 U/L (ref 6–29)
AST: 14 U/L (ref 10–35)
Albumin: 4.1 g/dL (ref 3.6–5.1)
Alkaline phosphatase (APISO): 104 U/L (ref 37–153)
BUN/Creatinine Ratio: 14 (calc) (ref 6–22)
BUN: 18 mg/dL (ref 7–25)
CO2: 26 mmol/L (ref 20–32)
Calcium: 9.3 mg/dL (ref 8.6–10.4)
Chloride: 102 mmol/L (ref 98–110)
Creat: 1.33 mg/dL — ABNORMAL HIGH (ref 0.50–0.99)
Globulin: 3.1 g/dL (calc) (ref 1.9–3.7)
Glucose, Bld: 104 mg/dL — ABNORMAL HIGH (ref 65–99)
Potassium: 3.8 mmol/L (ref 3.5–5.3)
Sodium: 138 mmol/L (ref 135–146)
Total Bilirubin: 0.6 mg/dL (ref 0.2–1.2)
Total Protein: 7.2 g/dL (ref 6.1–8.1)

## 2020-04-05 LAB — LIPID PANEL
Cholesterol: 150 mg/dL (ref <200)
HDL: 51 mg/dL (ref >=50)
LDL Cholesterol (Calc): 69 mg/dL (calc)
Non-HDL Cholesterol (Calc): 99 mg/dL (calc) (ref <130)
Total CHOL/HDL Ratio: 2.9 (calc) (ref <5.0)
Triglycerides: 238 mg/dL — ABNORMAL HIGH (ref <150)

## 2020-04-05 LAB — HEMOGLOBIN A1C
Hgb A1c MFr Bld: 5.9 % of total Hgb — ABNORMAL HIGH (ref <5.7)
Mean Plasma Glucose: 123 (calc)
eAG (mmol/L): 6.8 (calc)

## 2020-04-05 LAB — T4, FREE: Free T4: 1.3 ng/dL (ref 0.8–1.8)

## 2020-04-05 LAB — T3, FREE: T3, Free: 2.7 pg/mL (ref 2.3–4.2)

## 2020-04-05 LAB — TSH: TSH: 5.41 mIU/L — ABNORMAL HIGH (ref 0.40–4.50)

## 2020-04-06 ENCOUNTER — Ambulatory Visit: Payer: Medicare Other | Admitting: Nutrition

## 2020-04-06 ENCOUNTER — Other Ambulatory Visit: Payer: Self-pay | Admitting: *Deleted

## 2020-04-06 DIAGNOSIS — E039 Hypothyroidism, unspecified: Secondary | ICD-10-CM

## 2020-04-06 DIAGNOSIS — R609 Edema, unspecified: Secondary | ICD-10-CM

## 2020-04-10 ENCOUNTER — Encounter: Payer: Self-pay | Admitting: Nutrition

## 2020-04-12 DIAGNOSIS — Z936 Other artificial openings of urinary tract status: Secondary | ICD-10-CM | POA: Diagnosis not present

## 2020-04-12 DIAGNOSIS — C679 Malignant neoplasm of bladder, unspecified: Secondary | ICD-10-CM | POA: Diagnosis not present

## 2020-04-17 ENCOUNTER — Other Ambulatory Visit: Payer: Self-pay | Admitting: *Deleted

## 2020-04-17 MED ORDER — MORPHINE SULFATE ER 15 MG PO TBCR: 15.0000 mg | EXTENDED_RELEASE_TABLET | Freq: Two times a day (BID) | ORAL | 0 refills | Status: DC

## 2020-04-17 MED ORDER — OXYCODONE HCL 5 MG PO TABS
5.0000 mg | ORAL_TABLET | Freq: Three times a day (TID) | ORAL | 0 refills | Status: DC | PRN
Start: 2020-04-17 — End: 2020-05-18

## 2020-04-17 NOTE — Telephone Encounter (Signed)
Received call from patient.   Requested refill on MS Contin and Oxycodone.   Ok to refill??  Last office visit 04/04/2020.  Last refill 03/20/2020.

## 2020-04-19 DIAGNOSIS — M6289 Other specified disorders of muscle: Secondary | ICD-10-CM | POA: Diagnosis not present

## 2020-04-24 ENCOUNTER — Encounter: Payer: Medicare Other | Attending: Family Medicine | Admitting: Nutrition

## 2020-04-24 ENCOUNTER — Other Ambulatory Visit: Payer: Self-pay

## 2020-04-24 VITALS — Ht 65.5 in | Wt 196.0 lb

## 2020-04-24 DIAGNOSIS — E441 Mild protein-calorie malnutrition: Secondary | ICD-10-CM

## 2020-04-24 DIAGNOSIS — Z936 Other artificial openings of urinary tract status: Secondary | ICD-10-CM

## 2020-04-24 DIAGNOSIS — K760 Fatty (change of) liver, not elsewhere classified: Secondary | ICD-10-CM

## 2020-04-24 DIAGNOSIS — E782 Mixed hyperlipidemia: Secondary | ICD-10-CM

## 2020-04-24 DIAGNOSIS — R7303 Prediabetes: Secondary | ICD-10-CM

## 2020-04-24 DIAGNOSIS — R197 Diarrhea, unspecified: Secondary | ICD-10-CM

## 2020-04-24 DIAGNOSIS — I10 Essential (primary) hypertension: Secondary | ICD-10-CM

## 2020-04-24 DIAGNOSIS — R739 Hyperglycemia, unspecified: Secondary | ICD-10-CM

## 2020-04-24 DIAGNOSIS — Z8551 Personal history of malignant neoplasm of bladder: Secondary | ICD-10-CM

## 2020-04-24 NOTE — Patient Instructions (Signed)
Goals  Keep food journal  Cut processed foods of bacon, bologna, sausage, biscuits  Cut out sodas, kool aid and sugary foods and fried foods.  Eat more fruit, vegetables and protein at meals.  Drink only water

## 2020-04-24 NOTE — Progress Notes (Signed)
°  Medical Nutrition Therapy:  Appt start time: 1300 end time:  1400.   Assessment:  Primary concerns today:  Chronic diarrhea, urostomy, Numerous bowel movements daily.. She lives by herself. Widow.Elinor Parkinson 2 lbs since last visit. BMI 32. History of hyperlipidemia, HTN and elevated BS. Has been working on cutting out processed foods. Hasn't eaten out as much. Eating more green beans, carrots.  Still can't tolerate lettuce. Bowels are much better and is going less often. Working on cutting out sodas. Has reduced from having about 30 BM"s a day to about 10 a day. Keep a food journal and this helped a lot in identifying foods that caused more loose stools. She is making progress and improvement in food choices.   Lab Results  Component Value Date   HGBA1C 5.9 (H) 04/04/2020   CMP Latest Ref Rng & Units 04/04/2020 03/30/2020 01/31/2020  Glucose 65 - 99 mg/dL 104(H) 112(H) 102(H)  BUN 7 - 25 mg/dL 18 16 17   Creatinine 0.50 - 0.99 mg/dL 1.33(H) 1.12(H) 1.15(H)  Sodium 135 - 146 mmol/L 138 141 139  Potassium 3.5 - 5.3 mmol/L 3.8 3.7 3.4(L)  Chloride 98 - 110 mmol/L 102 103 106  CO2 20 - 32 mmol/L 26 23 22   Calcium 8.6 - 10.4 mg/dL 9.3 9.5 8.5(L)  Total Protein 6.1 - 8.1 g/dL 7.2 - -  Total Bilirubin 0.2 - 1.2 mg/dL 0.6 - -  Alkaline Phos 38 - 126 U/L - - -  AST 10 - 35 U/L 14 - -  ALT 6 - 29 U/L 10 - -    Preferred Learning Style:  No preference indicated   Learning Readiness:   Ready  Change in progress   MEDICATIONS:   DIETARY INTAKE:  Kept a food journal. Eating better meals. Still room for improvement on avoiding processed foods of pizza and fried foods. Sugary foods tend to make her bowels worse also.  Usual physical activity: ADL  Estimated energy needs: 1500  calories 170  g carbohydrates 112 g protein 42 g fat  Progress Towards Goal(s):  In progress.   Nutritional Diagnosis:  NI-5.11.1 Predicted suboptimal nutrient intake As related to bowel issues and  malabsorption   As evidenced by Low potassium, Low magnesium levels and chronic diarrhea >15 times per day.     Intervention:  FODMAP diet. Focusing on eating more nutrient dense foods, avoiding high fat, high sugar and processed foods. Drinking only water.  Goals  Keep food journal  Cut processed foods of bacon, bologna, sausage, biscuits  Cut out sodas, kool aid and sugary foods and fried foods.  Eat more fruit, vegetables and protein at meals.  Drink only water   Teaching Method Utilized:  Visual Auditory Hands on  Handouts given during visit include:  The Plate Method   FODMAP   Barriers to learning/adherence to lifestyle change: none  Demonstrated degree of understanding via:  Teach Back   Monitoring/Evaluation:  Dietary intake, exercise, , and body weight in 2 month(s).

## 2020-04-25 ENCOUNTER — Ambulatory Visit (HOSPITAL_COMMUNITY)
Admission: RE | Admit: 2020-04-25 | Discharge: 2020-04-25 | Disposition: A | Discharge status: Home / Self Care | Payer: Medicare Other | Source: Ambulatory Visit | Attending: Urology | Admitting: Urology

## 2020-04-25 ENCOUNTER — Other Ambulatory Visit: Payer: Self-pay | Admitting: Family Medicine

## 2020-04-25 DIAGNOSIS — C679 Malignant neoplasm of bladder, unspecified: Secondary | ICD-10-CM | POA: Insufficient documentation

## 2020-04-25 DIAGNOSIS — I700 Atherosclerosis of aorta: Secondary | ICD-10-CM | POA: Diagnosis not present

## 2020-04-25 DIAGNOSIS — K7689 Other specified diseases of liver: Secondary | ICD-10-CM | POA: Diagnosis not present

## 2020-04-25 MED ORDER — IOHEXOL 300 MG/ML  SOLN
75.0000 mL | Freq: Once | INTRAMUSCULAR | Status: AC | PRN
  Administered 2020-04-25: 75 mL via INTRAVENOUS

## 2020-04-28 ENCOUNTER — Ambulatory Visit (INDEPENDENT_AMBULATORY_CARE_PROVIDER_SITE_OTHER): Payer: Medicare Other | Admitting: Family Medicine

## 2020-04-28 ENCOUNTER — Other Ambulatory Visit: Payer: Self-pay

## 2020-04-28 VITALS — BP 140/80 | HR 80 | Temp 97.6°F | Ht 65.5 in | Wt 197.0 lb

## 2020-04-28 DIAGNOSIS — R197 Diarrhea, unspecified: Secondary | ICD-10-CM | POA: Diagnosis not present

## 2020-04-28 DIAGNOSIS — M79642 Pain in left hand: Secondary | ICD-10-CM | POA: Diagnosis not present

## 2020-04-28 NOTE — Patient Instructions (Signed)
Use Ice pack a few times a day Use topical anti-inflammatory Voltaren Take the medicines prescribed by your GI F/U as previous

## 2020-04-28 NOTE — Progress Notes (Signed)
   Subjective:    Patient ID: [name], female    DOB: 03/28/1951, 69 y.o.   MRN: [id number]  Patient presents for Hand Pain (Pt stated that she has been experiencing some Lt hand pain since 04/27/2020. She stated that she has not done anything different to cause the pain.)  Left hand pain that started yesterday, she was washing dishes, used ice pack, no injury ot hand. There was some mild swelling across hand but it went down Nothing on right side She used some type of topical   She also had questions about her xifamin, she had not been taking as prescribed by her GI, past few days increase BM and gas No change in diet No blood in stool  Review Of Systems:  GEN- denies fatigue, fever, weight loss,weakness, recent illness HEENT- denies eye drainage, change in vision, nasal discharge, CVS- denies chest pain, palpitations RESP- denies SOB, cough, wheeze ABD- denies N/V, change in stools, abd pain Neuro- denies headache, dizziness, syncope, seizure activity       Objective:    BP 140/80 (BP Location: Right Arm, Patient Position: Sitting, Cuff Size: Normal)   Pulse 80   Temp 97.6 F (36.4 C) (Oral)   Ht 5' 5.5" (1.664 m)   Wt 197 lb (89.4 kg)   SpO2 99%   BMI 32.28 kg/m  GEN- NAD, alert and oriented x3 HEENT- PERRL, EOMI, non injected sclera, pink conjunctiva, MMM, oropharynx clear Neck- Supple, no thyromegaly CVS- RRR, no murmur RESP-CTAB ABD-NABS,soft,NT,ND, urostomy in tact Left hand- mild TTP above wrist and base of thumb, minimal swelling, able to make fist, FROM wrist, fingers, sensation in tact Pulses- Radial  2+        Assessment & Plan:      Problem List Items Addressed This Visit      Unprioritized   Diarrhea    Chronic diarrhea, advised to take meds as prescribed by GI Contact them No acute abdomen       Other Visit Diagnoses    Left hand pain    -  Primary   acute inflammation, no injury, FROM, discussed possible overuse, tenditis,  gout, declines uric acid level today, as swelling improved, use ICE, given topical voltaren given      Note: This dictation was prepared with Dragon dictation along with smaller phrase technology. Any transcriptional errors that result from this process are unintentional.

## 2020-04-29 ENCOUNTER — Encounter: Payer: Self-pay | Admitting: Family Medicine

## 2020-04-29 NOTE — Assessment & Plan Note (Signed)
Chronic diarrhea, advised to take meds as prescribed by GI Contact them No acute abdomen

## 2020-05-11 DIAGNOSIS — M6289 Other specified disorders of muscle: Secondary | ICD-10-CM | POA: Diagnosis not present

## 2020-05-15 ENCOUNTER — Other Ambulatory Visit: Payer: Self-pay

## 2020-05-15 ENCOUNTER — Ambulatory Visit (INDEPENDENT_AMBULATORY_CARE_PROVIDER_SITE_OTHER): Payer: Medicare Other | Admitting: *Deleted

## 2020-05-15 DIAGNOSIS — E538 Deficiency of other specified B group vitamins: Secondary | ICD-10-CM

## 2020-05-15 DIAGNOSIS — E539 Vitamin B deficiency, unspecified: Secondary | ICD-10-CM

## 2020-05-16 DIAGNOSIS — Z936 Other artificial openings of urinary tract status: Secondary | ICD-10-CM | POA: Diagnosis not present

## 2020-05-16 DIAGNOSIS — C679 Malignant neoplasm of bladder, unspecified: Secondary | ICD-10-CM | POA: Diagnosis not present

## 2020-05-18 ENCOUNTER — Other Ambulatory Visit: Payer: Self-pay | Admitting: *Deleted

## 2020-05-18 MED ORDER — MORPHINE SULFATE ER 15 MG PO TBCR
15.0000 mg | EXTENDED_RELEASE_TABLET | Freq: Two times a day (BID) | ORAL | 0 refills | Status: DC
Start: 2020-05-18 — End: 2020-06-19

## 2020-05-18 MED ORDER — OXYCODONE HCL 5 MG PO TABS: 5.0000 mg | ORAL_TABLET | Freq: Three times a day (TID) | ORAL | 0 refills | Status: DC | PRN

## 2020-05-18 NOTE — Telephone Encounter (Signed)
Ok to refill??  Last office visit 04/28/2020.  Last refill 04/17/2020 on both

## 2020-05-30 ENCOUNTER — Ambulatory Visit: Payer: Medicare Other | Admitting: Nutrition

## 2020-06-12 ENCOUNTER — Ambulatory Visit
Admission: EM | Admit: 2020-06-12 | Discharge: 2020-06-12 | Disposition: A | Discharge status: Home / Self Care | Payer: Medicare Other | Attending: Emergency Medicine | Admitting: Emergency Medicine

## 2020-06-12 DIAGNOSIS — Z1152 Encounter for screening for COVID-19: Secondary | ICD-10-CM | POA: Diagnosis not present

## 2020-06-14 ENCOUNTER — Other Ambulatory Visit: Payer: Self-pay

## 2020-06-14 ENCOUNTER — Ambulatory Visit (INDEPENDENT_AMBULATORY_CARE_PROVIDER_SITE_OTHER): Payer: Medicare Other | Admitting: Family Medicine

## 2020-06-14 DIAGNOSIS — E539 Vitamin B deficiency, unspecified: Secondary | ICD-10-CM | POA: Diagnosis not present

## 2020-06-14 DIAGNOSIS — Z936 Other artificial openings of urinary tract status: Secondary | ICD-10-CM | POA: Diagnosis not present

## 2020-06-14 DIAGNOSIS — C679 Malignant neoplasm of bladder, unspecified: Secondary | ICD-10-CM | POA: Diagnosis not present

## 2020-06-14 LAB — SARS-COV-2, NAA 2 DAY TAT

## 2020-06-14 LAB — NOVEL CORONAVIRUS, NAA: SARS-CoV-2, NAA: NOT DETECTED

## 2020-06-14 NOTE — Progress Notes (Signed)
Vitamin b 12 injection, given in left deltoid. Tolerated well with the aid freeze spray.

## 2020-06-19 ENCOUNTER — Telehealth: Payer: Self-pay

## 2020-06-19 ENCOUNTER — Other Ambulatory Visit: Payer: Self-pay | Admitting: *Deleted

## 2020-06-19 ENCOUNTER — Other Ambulatory Visit: Payer: Self-pay | Admitting: Family Medicine

## 2020-06-19 MED ORDER — MORPHINE SULFATE ER 15 MG PO TBCR: 15.0000 mg | EXTENDED_RELEASE_TABLET | Freq: Two times a day (BID) | ORAL | 0 refills | Status: DC

## 2020-06-19 MED ORDER — OXYCODONE HCL 5 MG PO TABS: 5.0000 mg | ORAL_TABLET | Freq: Three times a day (TID) | ORAL | 0 refills | Status: DC | PRN

## 2020-06-19 NOTE — Telephone Encounter (Signed)
Received call from patient.   Requested refill on MS Contin and Oxycodone.   Ok to refill??  Last office visit 04/28/2020.  Last refill 05/18/2020 on both.

## 2020-06-19 NOTE — Telephone Encounter (Signed)
Med refilled.

## 2020-06-20 DIAGNOSIS — M6289 Other specified disorders of muscle: Secondary | ICD-10-CM | POA: Diagnosis not present

## 2020-06-20 NOTE — Telephone Encounter (Signed)
Pt called requesting refill on urostomy bags. Pt is aware that the refill request is to be made with urology.

## 2020-06-21 ENCOUNTER — Other Ambulatory Visit: Payer: Self-pay

## 2020-06-21 ENCOUNTER — Encounter: Payer: Self-pay | Admitting: Nutrition

## 2020-06-21 ENCOUNTER — Encounter: Payer: Medicare Other | Attending: Family Medicine | Admitting: Nutrition

## 2020-06-21 VITALS — Ht 60.5 in | Wt 196.0 lb

## 2020-06-21 DIAGNOSIS — R739 Hyperglycemia, unspecified: Secondary | ICD-10-CM

## 2020-06-21 DIAGNOSIS — E441 Mild protein-calorie malnutrition: Secondary | ICD-10-CM

## 2020-06-21 DIAGNOSIS — K760 Fatty (change of) liver, not elsewhere classified: Secondary | ICD-10-CM | POA: Insufficient documentation

## 2020-06-21 DIAGNOSIS — E782 Mixed hyperlipidemia: Secondary | ICD-10-CM

## 2020-06-21 DIAGNOSIS — I10 Essential (primary) hypertension: Secondary | ICD-10-CM

## 2020-06-21 NOTE — Patient Instructions (Signed)
Goals  Keep using the food journal. Increase consumption of applesauce, bananas, potatoes and dried beans for helping with slowing down bowel movements. Continue to increase water intake to 64 oz.  Cut back on fried foods, soda and fast foods. Limit Gatorade to 1 per day

## 2020-06-21 NOTE — Progress Notes (Signed)
Medical Nutrition Therapy:  Appt start time: 1100 end time:  1130   Assessment:  Primary concerns today:  Chronic diarrhea, urostomy, Numerous bowel movements daily.. She lives by herself. Widow. Gets monthly Vit B12 injections.   Brought her food journal. She is doing much better with a lot less bowel movements and smaller movements. Not constipated as much. Has begin to notice how certain foods effect her bowels and she is learning to make adjustments. Biggest issues is simple sugars and high fat, processed foods. Is drinking more water and that has helped.   Progress of goals previous set:  Keep food journal-done  Cut processed foods of bacon, bologna, sausage, biscuits- work in progress  Cut out sodas, kool aid and sugary foods and fried foods- work in progress but better..  Eat more fruit, vegetables and protein at meals-doing better.  Drink only water- working on drinking more.  Worked with PT yesterday .Therapist is working to help her reduce her gas.  Wt Readings from Last 3 Encounters:  06/21/20 196 lb (88.9 kg)  04/28/20 197 lb (89.4 kg)  04/24/20 196 lb (88.9 kg)   Ht Readings from Last 3 Encounters:  06/21/20 5' 0.5" (1.537 m)  04/28/20 5' 5.5" (1.664 m)  04/24/20 5' 5.5" (1.664 m)   Body mass index is 37.65 kg/m. @BMIFA @ Facility age limit for growth percentiles is 20 years. Facility age limit for growth percentiles is 20 years.    Lab Results  Component Value Date   HGBA1C 5.9 (H) 04/04/2020   CMP Latest Ref Rng & Units 04/04/2020 03/30/2020 01/31/2020  Glucose 65 - 99 mg/dL 104(H) 112(H) 102(H)  BUN 7 - 25 mg/dL 18 16 17   Creatinine 0.50 - 0.99 mg/dL 1.33(H) 1.12(H) 1.15(H)  Sodium 135 - 146 mmol/L 138 141 139  Potassium 3.5 - 5.3 mmol/L 3.8 3.7 3.4(L)  Chloride 98 - 110 mmol/L 102 103 106  CO2 20 - 32 mmol/L 26 23 22   Calcium 8.6 - 10.4 mg/dL 9.3 9.5 8.5(L)  Total Protein 6.1 - 8.1 g/dL 7.2 - -  Total Bilirubin 0.2 - 1.2 mg/dL 0.6 - -  Alkaline  Phos 38 - 126 U/L - - -  AST 10 - 35 U/L 14 - -  ALT 6 - 29 U/L 10 - -    Preferred Learning Style:  No preference indicated   Learning Readiness:   Ready  Change in progress   MEDICATIONS:   DIETARY INTAKE: .Goals  Keep using the food journal. Increase consumption of applesauce, bananas, potatoes and dried beans for helping with slowing down bowel movements. Continue to increase water intake to 64 oz.  Cut back on fried foods, soda and fast foods. Limit Gatorade to 1 per day   Usual physical activity: ADL  Estimated energy needs: 1500  calories 170  g carbohydrates 112 g protein 42 g fat  Progress Towards Goal(s):  In progress.   Nutritional Diagnosis:  NI-5.11.1 Predicted suboptimal nutrient intake As related to bowel issues and malabsorption   As evidenced by Low potassium, Low magnesium levels and chronic diarrhea >15 times per day.     Intervention:  FODMAP diet. Focusing on eating more nutrient dense foods, avoiding high fat, high sugar and processed foods. Drinking only water.  Goals  Keep using the food journal. Increase consumption of applesauce, bananas, potatoes and dried beans for helping with slowing down bowel movements. Continue to increase water intake to 64 oz.  Cut back on fried foods, soda and fast foods.  Limit Gatorade to 1 per day    Teaching Method Utilized:  Visual Auditory Hands on  Handouts given during visit include:  The Plate Method   FODMAP   Barriers to learning/adherence to lifestyle change: none  Demonstrated degree of understanding via:  Teach Back   Monitoring/Evaluation:  Dietary intake, exercise, , and body weight in 2 month(s).

## 2020-07-04 DIAGNOSIS — M6289 Other specified disorders of muscle: Secondary | ICD-10-CM | POA: Diagnosis not present

## 2020-07-05 ENCOUNTER — Encounter: Payer: Self-pay | Admitting: Nutrition

## 2020-07-14 DIAGNOSIS — M6289 Other specified disorders of muscle: Secondary | ICD-10-CM | POA: Diagnosis not present

## 2020-07-19 ENCOUNTER — Other Ambulatory Visit: Payer: Self-pay | Admitting: *Deleted

## 2020-07-19 DIAGNOSIS — C679 Malignant neoplasm of bladder, unspecified: Secondary | ICD-10-CM | POA: Diagnosis not present

## 2020-07-19 DIAGNOSIS — Z936 Other artificial openings of urinary tract status: Secondary | ICD-10-CM | POA: Diagnosis not present

## 2020-07-19 NOTE — Telephone Encounter (Signed)
Received call from patient.   Requested refill on MS Contin and Oxycodone.   Ok to refill??  Last office visit 06/14/2020.  Last refill 06/19/2020 on both.

## 2020-07-20 MED ORDER — OXYCODONE HCL 5 MG PO TABS: 5.0000 mg | ORAL_TABLET | Freq: Three times a day (TID) | ORAL | 0 refills | Status: DC | PRN

## 2020-07-20 MED ORDER — MORPHINE SULFATE ER 15 MG PO TBCR: 15.0000 mg | EXTENDED_RELEASE_TABLET | Freq: Two times a day (BID) | ORAL | 0 refills | Status: DC

## 2020-08-04 DIAGNOSIS — M6289 Other specified disorders of muscle: Secondary | ICD-10-CM | POA: Diagnosis not present

## 2020-08-15 ENCOUNTER — Other Ambulatory Visit: Payer: Self-pay | Admitting: Family Medicine

## 2020-08-15 ENCOUNTER — Ambulatory Visit (INDEPENDENT_AMBULATORY_CARE_PROVIDER_SITE_OTHER): Payer: Medicare Other | Admitting: Family Medicine

## 2020-08-15 ENCOUNTER — Other Ambulatory Visit: Payer: Self-pay

## 2020-08-15 VITALS — BP 138/84 | HR 91 | Temp 97.4°F | Ht 60.5 in | Wt 198.6 lb

## 2020-08-15 DIAGNOSIS — R7303 Prediabetes: Secondary | ICD-10-CM | POA: Diagnosis not present

## 2020-08-15 DIAGNOSIS — E782 Mixed hyperlipidemia: Secondary | ICD-10-CM | POA: Diagnosis not present

## 2020-08-15 DIAGNOSIS — E039 Hypothyroidism, unspecified: Secondary | ICD-10-CM

## 2020-08-15 DIAGNOSIS — E538 Deficiency of other specified B group vitamins: Secondary | ICD-10-CM

## 2020-08-15 DIAGNOSIS — K760 Fatty (change of) liver, not elsewhere classified: Secondary | ICD-10-CM

## 2020-08-15 DIAGNOSIS — Z936 Other artificial openings of urinary tract status: Secondary | ICD-10-CM | POA: Diagnosis not present

## 2020-08-15 DIAGNOSIS — I10 Essential (primary) hypertension: Secondary | ICD-10-CM

## 2020-08-15 DIAGNOSIS — Z8551 Personal history of malignant neoplasm of bladder: Secondary | ICD-10-CM

## 2020-08-15 MED ORDER — MORPHINE SULFATE ER 15 MG PO TBCR: 15.0000 mg | EXTENDED_RELEASE_TABLET | Freq: Two times a day (BID) | ORAL | 0 refills | Status: DC

## 2020-08-15 MED ORDER — OXYCODONE HCL 5 MG PO TABS: 5.0000 mg | ORAL_TABLET | Freq: Three times a day (TID) | ORAL | 0 refills | Status: DC | PRN

## 2020-08-15 NOTE — Patient Instructions (Signed)
F/U 3 months Dr. Dennard Schaumann ( 30 minute slot)

## 2020-08-15 NOTE — Progress Notes (Signed)
   Subjective:    Patient ID: [name], female    DOB: 02-18-1951, 70 y.o.   MRN: [id number]  Patient presents for Follow-up (Vitamin b def), Leg Swelling (Noticed 1 month ago in the right leg), and Medication Refill (Pended medication)   Pt here to f/u chronic medical problems     medications reviewed   Chronic pain- needs medication   Vitamin B - due for b12 shot    she has noticed that right leg is larer than the other, no pain or injury, for the past 3-4 months  Noticed the difference   She is following wih GI for her chronic bowel iussues /rectal pain   Pre diabetes last A1C 5.9%  Hypothyroidism- taking levothyroxine overdue for recheck on thyroid level   Needs refill on pain meds   Review Of Systems:  GEN- denies fatigue, fever, weight loss,weakness, recent illness HEENT- denies eye drainage, change in vision, nasal discharge, CVS- denies chest pain, palpitations RESP- denies SOB, cough, wheeze ABD- denies N/V, change in stools, abd pain GU- denies dysuria, hematuria, dribbling, incontinence MSK- denies joint pain, muscle aches, injury Neuro- denies headache, dizziness, syncope, seizure activity       Objective:    BP 138/84   Pulse 91   Temp (!) 97.4 F (36.3 C)   Ht 5' 0.5" (1.537 m)   Wt 198 lb 9.6 oz (90.1 kg)   SpO2 96%   BMI 38.15 kg/m  GEN- NAD, alert and oriented x3 HEENT- PERRL, EOMI, non injected sclera, pink conjunctiva, MMM, oropharynx clear Neck- Supple, no thyromegaly CVS- RRR, no murmur RESP-CTAB ABD-NABS,soft,NT,ND, urostomy in tact  MSK no apparent size difference in bilat thighs, NT, fair ROM knees, hips EXT- No edema Pulses- Radial, DP- 2+        Assessment & Plan:   Fasting labs next visit   Problem List Items Addressed This Visit      Unprioritized   Borderline diabetes    A1C has been stable, discussed diet, reduce sugars/carbs in diet       Essential hypertension - Primary    bp looks okay, no changes to  meds       Relevant Orders   CBC with Differential/Platelet (Completed)   Comprehensive metabolic panel (Completed)   Fatty liver   Relevant Orders   Comprehensive metabolic panel (Completed)   Hx of bladder cancer    Current urostomy Supplies needed to maintain health Script written      Hyperlipidemia    Known fatty liver and HLD  Has seen GI      Hypothyroidism    Recheck TSH on replacement       Relevant Orders   TSH (Completed)   T3, free (Completed)   Presence of urostomy ([place])    Other Visit Diagnoses    B12 deficiency       Relevant Orders   Vitamin B12 (Completed)      Note: This dictation was prepared with Dragon dictation along with smaller phrase technology. Any transcriptional errors that result from this process are unintentional.

## 2020-08-16 ENCOUNTER — Encounter: Payer: Self-pay | Admitting: Family Medicine

## 2020-08-16 LAB — CBC WITH DIFFERENTIAL/PLATELET
Absolute Monocytes: 593 cells/uL (ref 200–950)
Basophils Absolute: 40 cells/uL (ref 0–200)
Basophils Relative: 0.5 %
Eosinophils Absolute: 198 cells/uL (ref 15–500)
Eosinophils Relative: 2.5 %
HCT: 38.1 % (ref 35.0–45.0)
Hemoglobin: 12.6 g/dL (ref 11.7–15.5)
Lymphs Abs: 1762 cells/uL (ref 850–3900)
MCH: 25.8 pg — ABNORMAL LOW (ref 27.0–33.0)
MCHC: 33.1 g/dL (ref 32.0–36.0)
MCV: 78.1 fL — ABNORMAL LOW (ref 80.0–100.0)
MPV: 10.5 fL (ref 7.5–12.5)
Monocytes Relative: 7.5 %
Neutro Abs: 5309 cells/uL (ref 1500–7800)
Neutrophils Relative %: 67.2 %
Platelets: 375 10*3/uL (ref 140–400)
RBC: 4.88 10*6/uL (ref 3.80–5.10)
RDW: 15.4 % — ABNORMAL HIGH (ref 11.0–15.0)
Total Lymphocyte: 22.3 %
WBC: 7.9 10*3/uL (ref 3.8–10.8)

## 2020-08-16 LAB — COMPREHENSIVE METABOLIC PANEL
AG Ratio: 1.4 (calc) (ref 1.0–2.5)
ALT: 11 U/L (ref 6–29)
AST: 13 U/L (ref 10–35)
Albumin: 4.1 g/dL (ref 3.6–5.1)
Alkaline phosphatase (APISO): 122 U/L (ref 37–153)
BUN/Creatinine Ratio: 12 (calc) (ref 6–22)
BUN: 17 mg/dL (ref 7–25)
CO2: 24 mmol/L (ref 20–32)
Calcium: 9.2 mg/dL (ref 8.6–10.4)
Chloride: 107 mmol/L (ref 98–110)
Creat: 1.39 mg/dL — ABNORMAL HIGH (ref 0.50–0.99)
Globulin: 2.9 g/dL (calc) (ref 1.9–3.7)
Glucose, Bld: 133 mg/dL — ABNORMAL HIGH (ref 65–99)
Potassium: 3.5 mmol/L (ref 3.5–5.3)
Sodium: 143 mmol/L (ref 135–146)
Total Bilirubin: 0.6 mg/dL (ref 0.2–1.2)
Total Protein: 7 g/dL (ref 6.1–8.1)

## 2020-08-16 LAB — VITAMIN B12: Vitamin B-12: >2000 pg/mL — ABNORMAL HIGH (ref 200–1100)

## 2020-08-16 LAB — T3, FREE: T3, Free: 2.8 pg/mL (ref 2.3–4.2)

## 2020-08-16 LAB — TSH: TSH: 3.37 mIU/L (ref 0.40–4.50)

## 2020-08-16 NOTE — Assessment & Plan Note (Signed)
Known fatty liver and HLD  Has seen GI

## 2020-08-16 NOTE — Assessment & Plan Note (Signed)
Current urostomy Supplies needed to maintain health Script written

## 2020-08-16 NOTE — Assessment & Plan Note (Signed)
bp looks okay, no changes to meds

## 2020-08-16 NOTE — Assessment & Plan Note (Signed)
Recheck TSH on replacement

## 2020-08-16 NOTE — Assessment & Plan Note (Signed)
A1C has been stable, discussed diet, reduce sugars/carbs in diet

## 2020-08-24 ENCOUNTER — Other Ambulatory Visit: Payer: Self-pay | Admitting: *Deleted

## 2020-08-24 NOTE — Telephone Encounter (Signed)
Received call from patient.   States that she will require refill on MS Contin and Oxycodone prior to being changed to Dr. Dennard Schaumann.   Ok to refill?

## 2020-08-25 MED ORDER — OXYCODONE HCL 5 MG PO TABS: 5.0000 mg | ORAL_TABLET | Freq: Three times a day (TID) | ORAL | 0 refills | Status: DC | PRN

## 2020-08-25 MED ORDER — MORPHINE SULFATE ER 15 MG PO TBCR: 15.0000 mg | EXTENDED_RELEASE_TABLET | Freq: Two times a day (BID) | ORAL | 0 refills | Status: DC

## 2020-08-26 ENCOUNTER — Other Ambulatory Visit: Payer: Self-pay

## 2020-08-26 ENCOUNTER — Encounter (HOSPITAL_COMMUNITY): Payer: Self-pay

## 2020-08-26 ENCOUNTER — Emergency Department (HOSPITAL_COMMUNITY)
Admission: EM | Admit: 2020-08-26 | Discharge: 2020-08-26 | Disposition: A | Discharge status: Home / Self Care | Payer: Medicare Other | Attending: Emergency Medicine | Admitting: Emergency Medicine

## 2020-08-26 DIAGNOSIS — Z7901 Long term (current) use of anticoagulants: Secondary | ICD-10-CM | POA: Insufficient documentation

## 2020-08-26 DIAGNOSIS — W228XXA Striking against or struck by other objects, initial encounter: Secondary | ICD-10-CM | POA: Diagnosis not present

## 2020-08-26 DIAGNOSIS — R609 Edema, unspecified: Secondary | ICD-10-CM

## 2020-08-26 DIAGNOSIS — R2243 Localized swelling, mass and lump, lower limb, bilateral: Secondary | ICD-10-CM | POA: Diagnosis not present

## 2020-08-26 DIAGNOSIS — E039 Hypothyroidism, unspecified: Secondary | ICD-10-CM | POA: Diagnosis not present

## 2020-08-26 DIAGNOSIS — I10 Essential (primary) hypertension: Secondary | ICD-10-CM | POA: Insufficient documentation

## 2020-08-26 DIAGNOSIS — Z8551 Personal history of malignant neoplasm of bladder: Secondary | ICD-10-CM | POA: Insufficient documentation

## 2020-08-26 DIAGNOSIS — S81811A Laceration without foreign body, right lower leg, initial encounter: Secondary | ICD-10-CM

## 2020-08-26 DIAGNOSIS — Z86718 Personal history of other venous thrombosis and embolism: Secondary | ICD-10-CM | POA: Diagnosis not present

## 2020-08-26 DIAGNOSIS — Z79899 Other long term (current) drug therapy: Secondary | ICD-10-CM | POA: Diagnosis not present

## 2020-08-26 DIAGNOSIS — Y92009 Unspecified place in unspecified non-institutional (private) residence as the place of occurrence of the external cause: Secondary | ICD-10-CM | POA: Insufficient documentation

## 2020-08-26 DIAGNOSIS — S8991XA Unspecified injury of right lower leg, initial encounter: Secondary | ICD-10-CM | POA: Diagnosis present

## 2020-08-26 DIAGNOSIS — R9431 Abnormal electrocardiogram [ECG] [EKG]: Secondary | ICD-10-CM | POA: Diagnosis not present

## 2020-08-26 DIAGNOSIS — R600 Localized edema: Secondary | ICD-10-CM | POA: Diagnosis not present

## 2020-08-26 NOTE — ED Provider Notes (Signed)
[facility] EMERGENCY DEPARTMENT Provider Note   CSN: [id number] Arrival date & time: 08/26/20  2206     History Chief Complaint  Patient presents with  . Leg Swelling    Right leg    [name] is a 70 y.o. female.  70 year old female who has a history of DVT, cancer, bowel obstruction, and a pulmonary embolus who presents the emergency department today for seeping out of her leg.  Patient states that she has consistent bilateral lower extremity peripheral edema.  This is unchanged.  She states that approximately 36 hours ago she was taking out the trash.  She states that she got a small cut to her leg when the bag hit her.  She states that since then she is had intermittent episodes where she will have clear sometimes milky fluid leaking from some on her leg.  She thought it might be her urostomy however she is double check that is not that.  She denies any shortness of breath.  She denies any asymmetric swelling.  She denies that she is having any pain.  She states that she did have a little bit episode of anxiety and chest pain when she saw the news about [place] in [place] as her son is in the [place] in [place].  She attributes this 100% to her just being worried.  She is not had any other chest pain, exertional symptoms, nausea, vomiting, diaphoresis or lightheadedness. On eliquis at home, states compliance.         Past Medical History:  Diagnosis Date  . Acid reflux   . Bowel obstruction (HCC) several   Recurrent SBO secondary to adhesions  . Cancer [facility]) 2011 [facility])   diagnosed in 2009 per pt. Invasive High grade Urothelial carcinoma- s/p radiation and Chemo   . Chronic back pain   . DVT (deep venous thrombosis) ([place]) 2015  . Fatty liver   . Gout   . Hyperlipidemia   . Hypertension    pt says taken off medication since has lost weight.  . Hypothyroidism   . Internal hemorrhoids   . Leukopenia 12/06/2011   HIV serology negative  . Malnutrition  ([place])   . PE (pulmonary embolism)    [facility]  . Rectal ulcer 2016   [facility]  . UTI (lower urinary tract infection)    Recurrent    Patient Active Problem List   Diagnosis Date Noted  . Malignant neoplasm of urinary bladder ([place]) 03/30/2020  . Chronic prostatitis without hematuria 03/30/2020  . Chronic cystitis with hematuria 03/30/2020  . Borderline diabetes 11/09/2019  . Presence of urostomy ([place]) 02/12/2019  . Osteopenia 06/08/2018  . Peripheral edema 05/27/2016  . Chronic rectal pain 06/02/2015  . Rectal ulcer 11/08/2014  . GERD (gastroesophageal reflux disease) 11/08/2014  . Fistula 06/27/2014  . Protein-calorie malnutrition ([place]) 06/27/2014  . BV (bacterial vaginosis) 04/05/2014  . Lung nodule 04/05/2014  . Grief reaction 01/28/2014  . Lung infiltrate on CT 01/14/2014  . DVT (deep venous thrombosis) ([place]) 12/17/2013  . Partial small bowel obstruction ([place]) 07/21/2013  . Chronic abdominal pain 09/23/2012  . Microcytic anemia 08/26/2012  . Encounter for screening colonoscopy 05/26/2012  . Post-menopausal bleeding 12/19/2011  . Recurrent UTI 12/05/2011  . Diarrhea 12/05/2011  . SBO (small bowel obstruction) ([place]) 10/02/2011  . Fatty liver 10/02/2011  . Elevated LFTs 09/03/2011  . Hx of bladder cancer 09/03/2011  . Bowel obstruction ([place]) 09/03/2011  . Hyperlipidemia 01/23/2007  . Hypothyroidism  07/21/2006  . Essential hypertension 07/21/2006  . ARTHRITIS 07/21/2006    Past Surgical History:  Procedure Laterality Date  . ABDOMINAL SURGERY     with intestinal "puncture" x 2, exploratory surgery   . BACK SURGERY     X2  . BLADDER REMOVAL    . CHOLECYSTECTOMY    . CYSTECTOMY     breast  . HERNIA REPAIR     mesh  . ILEO CONDUIT     For bladder cancer  . PORTACATH PLACEMENT       OB History    Gravida      Para      Term      Preterm      AB      Living  2     SAB      IAB      Ectopic      Multiple      Live Births               Family History  Problem Relation Age of Onset  . Heart disease Mother        enlarged  . Hypertension Mother   . Stroke Mother   . Diabetes Father   . Diabetes Sister   . Diabetes Maternal Grandmother   . Colon cancer Neg Hx     Social History   Tobacco Use  . Smoking status: Former Smoker    Packs/day: 0.50    Years: 25.00    Pack years: 12.50    Types: Cigarettes    Quit date: 10/03/2016    Years since quitting: 3.8  . Smokeless tobacco: Never Used  Vaping Use  . Vaping Use: Never used  Substance Use Topics  . Alcohol use: No  . Drug use: No    Home Medications Prior to Admission medications   Medication Sig Start Date End Date Taking? Authorizing Provider  Ascorbic Acid (VITAMIN C PO) Take 1 tablet by mouth daily.    [provider]  colestipol (COLESTID) 1 g tablet Take by mouth. 05/14/18   [provider]  DEXILANT 60 MG capsule TAKE 1 CAPSULE BY MOUTH  DAILY 01/13/20   Alycia Rossetti, MD  diclofenac Sodium (VOLTAREN) 1 % GEL APPLY TO AFFECTED AREAS 3 TIMES DAILY AS NEEDED. 01/24/20   Alycia Rossetti, MD  diphenoxylate-atropine (LOMOTIL) 2.5-0.025 MG tablet Take 1 tablet by mouth 4 (four) times daily as needed for diarrhea or loose stools. 12/20/19   Alycia Rossetti, MD  ELIQUIS 5 MG TABS tablet TAKE 1 TABLET BY MOUTH  TWICE DAILY 06/20/20   Alycia Rossetti, MD  Ergocalciferol (VITAMIN D2) 50 MCG [phone] UT) TABS tAKE 1 Tablet daily 08/18/18   Alycia Rossetti, MD  furosemide (LASIX) 40 MG tablet TAKE ONE TABLET BY MOUTH  TWICE DAILY AS NEEDED FOR  FLUID 01/13/20   Alycia Rossetti, MD  hydrocortisone 2.5 % cream APPLY RECTALLY TWICE DAILY AS NEEDED- please dispense with rectal tip 10/25/19   Alycia Rossetti, MD  levothyroxine (SYNTHROID) 150 MCG tablet TAKE 1 TABLET BY MOUTH  DAILY BEFORE BREAKFAST 11/16/19   [place], Modena Nunnery, MD  lidocaine (XYLOCAINE) 5 % ointment APPLY TOPICALLY 4 TIMES  DAILY AS NEEDED 04/25/20   Alycia Rossetti, MD  magnesium oxide (MAG-OX) 400 MG tablet Take by mouth. 04/28/17   [provider]  Magnesium Oxide 400 (240 Mg) MG TABS Take 1.5 tablet BID 02/02/20   , [place],  MD  morphine (MS CONTIN) 15 MG 12 hr tablet Take 1 tablet (15 mg total) by mouth every 12 (twelve) hours. 08/25/20   Alycia Rossetti, MD  Multiple Vitamin (MULTI-VITAMIN) tablet Take 2 tablets by mouth daily.    [provider]  Multiple Vitamins-Minerals (MULTIVITAMIN ADULT, MINERALS,) TABS Take 1 tablet by mouth daily.    [provider]  Multiple Vitamins-Minerals (MULTIVITAMIN PO) Take 1 tablet by mouth daily.    [provider]  ondansetron (ZOFRAN-ODT) 8 MG disintegrating tablet DISSOLVE 1 TABLET ON THE  TONGUE EVERY 8 HOURS AS  NEEDED FOR NAUSEA 11/22/19   [place], Modena Nunnery, MD  oxyCODONE (OXY IR/ROXICODONE) 5 MG immediate release tablet Take 1 tablet (5 mg total) by mouth every 8 (eight) hours as needed for severe pain. 08/25/20   Alycia Rossetti, MD  potassium chloride SA (KLOR-CON) 20 MEQ tablet TAKE 1 TABLET BY MOUTH  DAILY 08/15/20   Alycia Rossetti, MD  pravastatin (PRAVACHOL) 20 MG tablet TAKE 1 TABLET BY MOUTH  DAILY FOR CHOLESTEROL 01/13/20   [place], Modena Nunnery, MD  tiZANidine (ZANAFLEX) 4 MG tablet Take 1 tablet (4 mg total) by mouth every 6 (six) hours as needed for muscle spasms. 12/22/19   [place], Modena Nunnery, MD  XIFAXAN 550 MG TABS tablet Take 550 mg by mouth 3 (three) times daily. 02/18/20   [provider]    Allergies    Bactrim [sulfamethoxazole-trimethoprim], Codeine, Lactose, Nitrofuran derivatives, Sulfa antibiotics, and Penicillins  Review of Systems   Review of Systems  All other systems reviewed and are negative.   Physical Exam Updated Vital Signs BP 140/88 (BP Location: Right Arm)   Pulse 83   Temp 98 F (36.7 C) (Oral)   Resp 18   Ht 5' 5.5" (1.664 m)   Wt 88 kg   SpO2 98%   BMI 31.79 kg/m   Physical Exam Vitals and nursing  note reviewed.  Constitutional:      Appearance: She is well-developed and well-nourished.  HENT:     Head: Normocephalic and atraumatic.     Mouth/Throat:     Mouth: Mucous membranes are moist.     Pharynx: Oropharynx is clear.  Eyes:     Pupils: Pupils are equal, round, and reactive to light.  Cardiovascular:     Rate and Rhythm: Normal rate and regular rhythm.  Pulmonary:     Effort: No respiratory distress.     Breath sounds: No stridor.  Abdominal:     General: Abdomen is flat. There is no distension.  Musculoskeletal:     Cervical back: Normal range of motion.     Right lower leg: Edema present.     Left lower leg: Edema present.  Skin:    General: Skin is warm and dry.     Comments: Small superficial  Curvilinear laceration with clear drainage to right leg medial side.   Neurological:     General: No focal deficit present.     Mental Status: She is alert.     ED Results / Procedures / Treatments   Labs (all labs ordered are listed, but only abnormal results are displayed) Labs Reviewed - No data to display  EKG None  Radiology No results found.  Procedures Procedures   Medications Ordered in ED Medications - No data to display  ED Course  I have reviewed the triage vital signs and the nursing notes.  Pertinent labs & imaging results that were available during my care  of the patient were reviewed by me and considered in my medical decision making (see chart for details).    MDM Rules/Calculators/A&P                         Clear lungs and baseline edema low suspicion for acute volume overload.  I think she is leaking interstitial fluid.  Some Dermabond was applied and a light pressure dressing also to help with wound healing and reducing the leaking until that time.  Wound care instructions provided.  Also work on reducing her peripheral edema.  Final Clinical Impression(s) / ED Diagnoses Final diagnoses:  Peripheral edema  Leg edema  Laceration of  right lower extremity, initial encounter    Rx / DC Orders ED Discharge Orders    None       Ekin Pilar, Corene Cornea, MD 08/26/20 2325

## 2020-08-26 NOTE — ED Triage Notes (Signed)
Pt arrives from home via POV c/o right leg swelling. Pt noticed swelling and weeping from right leg after taking trash to dumpster. Pt denies SOB, dizziness and nausea at this time.

## 2020-08-29 NOTE — Telephone Encounter (Signed)
No action needed at this time, closing open encounter

## 2020-08-30 ENCOUNTER — Other Ambulatory Visit: Payer: Self-pay | Admitting: *Deleted

## 2020-08-30 MED ORDER — CYANOCOBALAMIN 1000 MCG PO CAPS: 1.0000 | ORAL_CAPSULE | Freq: Every day | ORAL | 3 refills | Status: AC

## 2020-08-31 ENCOUNTER — Ambulatory Visit (INDEPENDENT_AMBULATORY_CARE_PROVIDER_SITE_OTHER): Payer: Medicare Other | Admitting: Nurse Practitioner

## 2020-08-31 ENCOUNTER — Encounter: Payer: Self-pay | Admitting: Nurse Practitioner

## 2020-08-31 ENCOUNTER — Other Ambulatory Visit: Payer: Self-pay

## 2020-08-31 VITALS — BP 132/84 | HR 90 | Temp 97.2°F | Ht 65.5 in | Wt 199.8 lb

## 2020-08-31 DIAGNOSIS — R609 Edema, unspecified: Secondary | ICD-10-CM | POA: Diagnosis not present

## 2020-08-31 NOTE — Progress Notes (Signed)
Subjective:    Patient ID: [name], female    DOB: [birth year], 70 y.o.   MRN: [id number]  HPI: [name] is a 70 y.o. female presenting for ER follow up.  Chief Complaint  Patient presents with  . er follow up    Did have weeping in the right leg due to hitting something against it, swelling has gone down well. Does have topical glue put on by er doctor. Has changed diet to not consume too much salt to help with swelling   ER FOLLOW UP Time since discharge: Saturday  Hospital/facility: [facility] Diagnosis: peripheral edema Procedures/tests: none Consultants: none New medications: none Discharge instructions:   Skin glue, watch for infection of open area; take medication as prescribed. Status: better; patient reports she was cleaning her house today and shampooing her carpets without difficulty.  She has completely eliminated salt from her diet and reports the swelling has improved significantly.  LEG SWELLING Duration: chronic Pain: no Severity: mild  Quality: swelling Location:  lower legs Bilateral:  R > L Onset: gradual Frequency: random Time of day: day time Sudden unintentional leg jerking:   no Paresthesias:   no Decreased sensation:  no Weakness:   no Insomnia:   no Fatigue:   no Alleviating factors: watching salt intake, elevation Aggravating factors: lots of activity, missing water pill Status: better   Allergies  Allergen Reactions  . Bactrim [Sulfamethoxazole-Trimethoprim]   . Codeine Hives  . Lactose Diarrhea  . Nitrofuran Derivatives Itching  . Sulfa Antibiotics Hives  . Penicillins Rash    Has patient had a PCN reaction causing immediate rash, facial/tongue/throat swelling, SOB or lightheadedness with hypotension: No Has patient had a PCN reaction causing severe rash involving mucus membranes or skin necrosis: No Has patient had a PCN reaction that required hospitalization: No Has patient had a PCN reaction occurring  within the last 10 years: No If all of the above answers are "NO", then may proceed with Cephalosporin use.  Childhood rash - no adult reactions known    Outpatient Encounter Medications as of 08/31/2020  Medication Sig  . Ascorbic Acid (VITAMIN C PO) Take 1 tablet by mouth daily.  . colestipol (COLESTID) 1 g tablet Take by mouth.  . DEXILANT 60 MG capsule TAKE 1 CAPSULE BY MOUTH  DAILY  . diclofenac Sodium (VOLTAREN) 1 % GEL APPLY TO AFFECTED AREAS 3 TIMES DAILY AS NEEDED.  Marland Kitchen diphenoxylate-atropine (LOMOTIL) 2.5-0.025 MG tablet Take 1 tablet by mouth 4 (four) times daily as needed for diarrhea or loose stools.  Marland Kitchen ELIQUIS 5 MG TABS tablet TAKE 1 TABLET BY MOUTH  TWICE DAILY  . Ergocalciferol (VITAMIN D2) 50 MCG (2000 UT) TABS tAKE 1 Tablet daily  . furosemide (LASIX) 40 MG tablet TAKE ONE TABLET BY MOUTH  TWICE DAILY AS NEEDED FOR  FLUID  . hydrocortisone 2.5 % cream APPLY RECTALLY TWICE DAILY AS NEEDED- please dispense with rectal tip  . levothyroxine (SYNTHROID) 150 MCG tablet TAKE 1 TABLET BY MOUTH  DAILY BEFORE BREAKFAST  . lidocaine (XYLOCAINE) 5 % ointment APPLY TOPICALLY 4 TIMES  DAILY AS NEEDED  . magnesium oxide (MAG-OX) 400 MG tablet Take by mouth.  . Magnesium Oxide 400 (240 Mg) MG TABS Take 1.5 tablet BID  . morphine (MS CONTIN) 15 MG 12 hr tablet Take 1 tablet (15 mg total) by mouth every 12 (twelve) hours.  . Multiple Vitamin (MULTI-VITAMIN) tablet Take 2 tablets by mouth daily.  . Multiple Vitamins-Minerals (  MULTIVITAMIN ADULT, MINERALS,) TABS Take 1 tablet by mouth daily.  . Multiple Vitamins-Minerals (MULTIVITAMIN PO) Take 1 tablet by mouth daily.  . ondansetron (ZOFRAN-ODT) 8 MG disintegrating tablet DISSOLVE 1 TABLET ON THE  TONGUE EVERY 8 HOURS AS  NEEDED FOR NAUSEA  . oxyCODONE (OXY IR/ROXICODONE) 5 MG immediate release tablet Take 1 tablet (5 mg total) by mouth every 8 (eight) hours as needed for severe pain.  . potassium chloride SA (KLOR-CON) 20 MEQ tablet TAKE 1  TABLET BY MOUTH  DAILY  . pravastatin (PRAVACHOL) 20 MG tablet TAKE 1 TABLET BY MOUTH  DAILY FOR CHOLESTEROL  . tiZANidine (ZANAFLEX) 4 MG tablet Take 1 tablet (4 mg total) by mouth every 6 (six) hours as needed for muscle spasms.  Marland Kitchen XIFAXAN 550 MG TABS tablet Take 550 mg by mouth 3 (three) times daily.  . Cyanocobalamin 1000 MCG CAPS Take 1 tablet by mouth daily. (Patient not taking: Reported on 08/31/2020)   No facility-administered encounter medications on file as of 08/31/2020.    Patient Active Problem List   Diagnosis Date Noted  . Malignant neoplasm of urinary bladder ([place]) 03/30/2020  . Chronic prostatitis without hematuria 03/30/2020  . Chronic cystitis with hematuria 03/30/2020  . Borderline diabetes 11/09/2019  . Presence of urostomy ([place]) 02/12/2019  . Osteopenia 06/08/2018  . Peripheral edema 05/27/2016  . Chronic rectal pain 06/02/2015  . Rectal ulcer 11/08/2014  . GERD (gastroesophageal reflux disease) 11/08/2014  . Fistula 06/27/2014  . Protein-calorie malnutrition ([place]) 06/27/2014  . BV (bacterial vaginosis) 04/05/2014  . Lung nodule 04/05/2014  . Grief reaction 01/28/2014  . Lung infiltrate on CT 01/14/2014  . DVT (deep venous thrombosis) ([place]) 12/17/2013  . Partial small bowel obstruction ([place]) 07/21/2013  . Chronic abdominal pain 09/23/2012  . Microcytic anemia 08/26/2012  . Encounter for screening colonoscopy 05/26/2012  . Post-menopausal bleeding 12/19/2011  . Recurrent UTI 12/05/2011  . Diarrhea 12/05/2011  . SBO (small bowel obstruction) ([place]) 10/02/2011  . Fatty liver 10/02/2011  . Elevated LFTs 09/03/2011  . Hx of bladder cancer 09/03/2011  . Bowel obstruction ([place]) 09/03/2011  . Hyperlipidemia 01/23/2007  . Hypothyroidism 07/21/2006  . Essential hypertension 07/21/2006  . ARTHRITIS 07/21/2006    Past Medical History:  Diagnosis Date  . Acid reflux   . Bowel obstruction (HCC) several   Recurrent SBO secondary to adhesions  . Cancer [facility]) 2011  [facility])   diagnosed in 2009 per pt. Invasive High grade Urothelial carcinoma- s/p radiation and Chemo   . Chronic back pain   . DVT (deep venous thrombosis) ([place]) 2015  . Fatty liver   . Gout   . Hyperlipidemia   . Hypertension    pt says taken off medication since has lost weight.  . Hypothyroidism   . Internal hemorrhoids   . Leukopenia 12/06/2011   HIV serology negative  . Malnutrition ([place])   . PE (pulmonary embolism)    [facility]  . Rectal ulcer 2016   [facility]  . UTI (lower urinary tract infection)    Recurrent    Relevant past medical, surgical, family and social history reviewed and updated as indicated. Interim medical history since our last visit reviewed.  Review of Systems  Constitutional: Negative for activity change, appetite change, fatigue and fever.  Respiratory: Negative.  Negative for chest tightness, shortness of breath and wheezing.   Cardiovascular: Negative.  Negative for chest pain, palpitations and leg swelling.  Neurological: Negative.   Psychiatric/Behavioral: Negative.  Per HPI unless specifically indicated above     Objective:    BP 132/84   Pulse 90   Temp (!) 97.2 F (36.2 C)   Ht 5' 5.5" (1.664 m)   Wt 199 lb 12.8 oz (90.6 kg)   SpO2 96%   BMI 32.74 kg/m   Wt Readings from Last 3 Encounters:  08/31/20 199 lb 12.8 oz (90.6 kg)  08/26/20 194 lb (88 kg)  08/15/20 198 lb 9.6 oz (90.1 kg)    Physical Exam Vitals and nursing note reviewed.  Constitutional:      General: She is not in acute distress.    Appearance: Normal appearance. She is not toxic-appearing.  Cardiovascular:     Rate and Rhythm: Normal rate and regular rhythm.     Heart sounds: Normal heart sounds. No murmur heard.   Pulmonary:     Effort: Pulmonary effort is normal. No respiratory distress.     Breath sounds: Normal breath sounds. No wheezing, rhonchi or rales.  Musculoskeletal:     Right lower leg: No swelling. 1+ Pitting Edema  present.     Left lower leg: No swelling. Edema present.     Right ankle: Normal pulse.     Left ankle: Normal pulse.     Right foot: Normal. Normal capillary refill. Normal pulse.     Left foot: Normal. Normal capillary refill. Normal pulse.  Skin:    General: Skin is warm and dry.     Capillary Refill: Capillary refill takes less than 2 seconds.     Coloration: Skin is not jaundiced or pale.     Findings: No erythema.  Neurological:     Mental Status: She is alert and oriented to person, place, and time.  Psychiatric:        Mood and Affect: Mood normal.        Behavior: Behavior normal.        Thought Content: Thought content normal.        Judgment: Judgment normal.    Results for orders placed or performed in visit on 08/15/20  CBC with Differential/Platelet  Result Value Ref Range   WBC 7.9 3.8 - 10.8 Thousand/uL   RBC 4.88 3.80 - 5.10 Million/uL   Hemoglobin 12.6 11.7 - 15.5 g/dL   HCT 38.1 35.0 - 45.0 %   MCV 78.1 (L) 80.0 - 100.0 fL   MCH 25.8 (L) 27.0 - 33.0 pg   MCHC 33.1 32.0 - 36.0 g/dL   RDW 15.4 (H) 11.0 - 15.0 %   Platelets 375 140 - 400 Thousand/uL   MPV 10.5 7.5 - 12.5 fL   Neutro Abs 5,309 1,500 - 7,800 cells/uL   Lymphs Abs 1,762 850 - 3,900 cells/uL   Absolute Monocytes 593 200 - 950 cells/uL   Eosinophils Absolute 198 15 - 500 cells/uL   Basophils Absolute 40 0 - 200 cells/uL   Neutrophils Relative % 67.2 %   Total Lymphocyte 22.3 %   Monocytes Relative 7.5 %   Eosinophils Relative 2.5 %   Basophils Relative 0.5 %  Comprehensive metabolic panel  Result Value Ref Range   Glucose, Bld 133 (H) 65 - 99 mg/dL   BUN 17 7 - 25 mg/dL   Creat 1.39 (H) 0.50 - 0.99 mg/dL   BUN/Creatinine Ratio 12 6 - 22 (calc)   Sodium 143 135 - 146 mmol/L   Potassium 3.5 3.5 - 5.3 mmol/L   Chloride 107 98 - 110 mmol/L   CO2 24 20 -  32 mmol/L   Calcium 9.2 8.6 - 10.4 mg/dL   Total Protein 7.0 6.1 - 8.1 g/dL   Albumin 4.1 3.6 - 5.1 g/dL   Globulin 2.9 1.9 - 3.7 g/dL  (calc)   AG Ratio 1.4 1.0 - 2.5 (calc)   Total Bilirubin 0.6 0.2 - 1.2 mg/dL   Alkaline phosphatase (APISO) 122 37 - 153 U/L   AST 13 10 - 35 U/L   ALT 11 6 - 29 U/L  TSH  Result Value Ref Range   TSH 3.37 0.40 - 4.50 mIU/L  T3, free  Result Value Ref Range   T3, Free 2.8 2.3 - 4.2 pg/mL  Vitamin B12  Result Value Ref Range   Vitamin B-12 >2,000 (H) 200 - 1,100 pg/mL      Assessment & Plan:   Problem List Items Addressed This Visit      Other   Peripheral edema - Primary    Appears to be acute on chronic.  Echocardiogram is pending.  Continue Lasix as needed.  Continue DASH diet and start wearing compression hose 12 hours per day to help return fluid in legs to circulation.  Follow up as scheduled.          Follow up plan: Return for as scheduled.

## 2020-08-31 NOTE — Assessment & Plan Note (Signed)
Appears to be acute on chronic.  Echocardiogram is pending.  Continue Lasix as needed.  Continue DASH diet and start wearing compression hose 12 hours per day to help return fluid in legs to circulation.  Follow up as scheduled.

## 2020-09-01 DIAGNOSIS — M6289 Other specified disorders of muscle: Secondary | ICD-10-CM | POA: Diagnosis not present

## 2020-09-06 DIAGNOSIS — Z936 Other artificial openings of urinary tract status: Secondary | ICD-10-CM | POA: Diagnosis not present

## 2020-09-18 ENCOUNTER — Other Ambulatory Visit: Payer: Self-pay | Admitting: *Deleted

## 2020-09-18 MED ORDER — MORPHINE SULFATE ER 15 MG PO TBCR: 15.0000 mg | EXTENDED_RELEASE_TABLET | Freq: Two times a day (BID) | ORAL | 0 refills | Status: DC

## 2020-09-18 MED ORDER — OXYCODONE HCL 5 MG PO TABS
5.0000 mg | ORAL_TABLET | Freq: Three times a day (TID) | ORAL | 0 refills | Status: DC | PRN
Start: 2020-09-18 — End: 2020-10-17

## 2020-09-18 NOTE — Telephone Encounter (Signed)
Received call from patient.   Requested refill on Oxycodone and MS Contin.   Ok to refill??  Last office visit 08/31/2020.  Last refill 08/25/2020 on both.

## 2020-10-02 DIAGNOSIS — M6289 Other specified disorders of muscle: Secondary | ICD-10-CM | POA: Diagnosis not present

## 2020-10-13 DIAGNOSIS — Z936 Other artificial openings of urinary tract status: Secondary | ICD-10-CM | POA: Diagnosis not present

## 2020-10-17 ENCOUNTER — Other Ambulatory Visit: Payer: Self-pay

## 2020-10-17 ENCOUNTER — Encounter: Payer: Medicare Other | Attending: Family Medicine | Admitting: Nutrition

## 2020-10-17 ENCOUNTER — Other Ambulatory Visit: Payer: Self-pay | Admitting: *Deleted

## 2020-10-17 ENCOUNTER — Encounter: Payer: Self-pay | Admitting: Nutrition

## 2020-10-17 VITALS — Ht 65.5 in | Wt 201.4 lb

## 2020-10-17 DIAGNOSIS — Z9889 Other specified postprocedural states: Secondary | ICD-10-CM

## 2020-10-17 DIAGNOSIS — I10 Essential (primary) hypertension: Secondary | ICD-10-CM | POA: Insufficient documentation

## 2020-10-17 DIAGNOSIS — E669 Obesity, unspecified: Secondary | ICD-10-CM | POA: Insufficient documentation

## 2020-10-17 DIAGNOSIS — R739 Hyperglycemia, unspecified: Secondary | ICD-10-CM | POA: Insufficient documentation

## 2020-10-17 DIAGNOSIS — K760 Fatty (change of) liver, not elsewhere classified: Secondary | ICD-10-CM | POA: Insufficient documentation

## 2020-10-17 DIAGNOSIS — K529 Noninfective gastroenteritis and colitis, unspecified: Secondary | ICD-10-CM | POA: Insufficient documentation

## 2020-10-17 DIAGNOSIS — E782 Mixed hyperlipidemia: Secondary | ICD-10-CM | POA: Insufficient documentation

## 2020-10-17 NOTE — Telephone Encounter (Signed)
Received call from patient.   Requested refill on Oxycodone and MS Contin.   Ok to refill??  Last office visit 08/31/2020.  Last refill 09/18/2020.

## 2020-10-17 NOTE — Patient Instructions (Addendum)
DIETARY INTAKE: .Goals  Keep using the food journal. Continue to increase water intake to 64 oz.  Increase protein and low carb vegetables Cut out gatorade Try to eat less cucumber and onions to see if it reduces gas build up. Cut out fried foods, soda and fast foods. Lose 1 per week. Walk 15-30 minutes a day

## 2020-10-17 NOTE — Progress Notes (Signed)
Medical Nutrition Therapy:  Appt start time: 1020 end time:  1055   Assessment:  Primary concerns today:  Chronic diarrhea, urostomy, Numerous bowel movements daily follow up. She lives by herself. Widow. Gets monthly Vit B12 injections. Is doing better. Going to a physical therapist to help relax her bowls. It is helpful. No longer eating red meat much. Eats mostly more chicken, [place] and some fish. Eating more fruits and vegetables. Feels like she is still eating more bread and causing her weight gain along with drinking a lot of Gatorade.   Progress of goals previously set:  Keep food journal-done Cut processed foods of bacon, bologna, sausage, biscuits- done. Cut out sodas, kool aid and sugary foods and fried foods- done. Eat more fruit, vegetables and protein at meals-doing better. Drink only water- working on drinking more. Needs to cut out gatorade.  Worked with PT yesterday .Therapist is working to help her reduce her gas.  Wt Readings from Last 3 Encounters:  08/31/20 199 lb 12.8 oz (90.6 kg)  08/26/20 194 lb (88 kg)  08/15/20 198 lb 9.6 oz (90.1 kg)   Ht Readings from Last 3 Encounters:  08/31/20 5' 5.5" (1.664 m)  08/26/20 5' 5.5" (1.664 m)  08/15/20 5' 0.5" (1.537 m)   There is no height or weight on file to calculate BMI. @BMIFA @ Facility age limit for growth percentiles is 20 years. Facility age limit for growth percentiles is 20 years.    Lab Results  Component Value Date   HGBA1C 5.9 (H) 04/04/2020   CMP Latest Ref Rng & Units 08/15/2020 04/04/2020 03/30/2020  Glucose 65 - 99 mg/dL 133(H) 104(H) 112(H)  BUN 7 - 25 mg/dL 17 18 16   Creatinine 0.50 - 0.99 mg/dL 1.39(H) 1.33(H) 1.12(H)  Sodium 135 - 146 mmol/L 143 138 141  Potassium 3.5 - 5.3 mmol/L 3.5 3.8 3.7  Chloride 98 - 110 mmol/L 107 102 103  CO2 20 - 32 mmol/L 24 26 23   Calcium 8.6 - 10.4 mg/dL 9.2 9.3 9.5  Total Protein 6.1 - 8.1 g/dL 7.0 7.2 -  Total Bilirubin 0.2 - 1.2 mg/dL 0.6 0.6 -   Alkaline Phos 38 - 126 U/L - - -  AST 10 - 35 U/L 13 14 -  ALT 6 - 29 U/L 11 10 -    Preferred Learning Style:  No preference indicated   Learning Readiness:   Ready  Change in progress   MEDICATIONS:   DIETARY INTAKE: .Goals  Keep using the food journal. Continue to increase water intake to 64 oz.  Cut out gatorade Try to eat less cucumber and onions to see if it reduces gas build up. Cut back on fried foods, soda and fast foods. Lose 1 per week. Walk 15-30 minutes a day    Usual physical activity: ADL  Estimated energy needs: 1500  calories 170  g carbohydrates 112 g protein 42 g fat  Progress Towards Goal(s):  In progress.   Nutritional Diagnosis:  NI-5.11.1 Predicted suboptimal nutrient intake As related to bowel issues and malabsorption   As evidenced by Low potassium, Low magnesium levels and chronic diarrhea >15 times per day.     Intervention:  FODMAP diet. Focusing on eating more nutrient dense foods, avoiding high fat, high sugar and processed foods. Drinking only water.  Goals  DIETARY INTAKE: .Goals  Keep using the food journal. Continue to increase water intake to 64 oz.  Increase protein and low carb vegetables Cut out gatorade Try to eat less  cucumber and onions to see if it reduces gas build up. Cut out fried foods, soda and fast foods. Lose 1 per week. Walk 15-30 minutes a day   Teaching Method Utilized:  Visual Auditory Hands on  Handouts given during visit include:  The Plate Method   FODMAP   Barriers to learning/adherence to lifestyle change: none  Demonstrated degree of understanding via:  Teach Back   Monitoring/Evaluation:  Dietary intake, exercise, , and body weight in 1 month(s).

## 2020-10-18 ENCOUNTER — Other Ambulatory Visit: Payer: Self-pay | Admitting: Family Medicine

## 2020-10-18 DIAGNOSIS — E038 Other specified hypothyroidism: Secondary | ICD-10-CM

## 2020-10-18 MED ORDER — MORPHINE SULFATE ER 15 MG PO TBCR: 15.0000 mg | EXTENDED_RELEASE_TABLET | Freq: Two times a day (BID) | ORAL | 0 refills | Status: DC

## 2020-10-18 MED ORDER — OXYCODONE HCL 5 MG PO TABS: 5.0000 mg | ORAL_TABLET | Freq: Three times a day (TID) | ORAL | 0 refills | Status: DC | PRN

## 2020-11-01 DIAGNOSIS — M6289 Other specified disorders of muscle: Secondary | ICD-10-CM | POA: Diagnosis not present

## 2020-11-16 ENCOUNTER — Other Ambulatory Visit: Payer: Self-pay

## 2020-11-16 ENCOUNTER — Encounter: Payer: Self-pay | Admitting: Family Medicine

## 2020-11-16 ENCOUNTER — Ambulatory Visit (INDEPENDENT_AMBULATORY_CARE_PROVIDER_SITE_OTHER): Payer: Medicare Other | Admitting: Family Medicine

## 2020-11-16 VITALS — BP 110/68 | HR 82 | Temp 98.7°F | Resp 14 | Ht 65.5 in | Wt 195.0 lb

## 2020-11-16 DIAGNOSIS — R7303 Prediabetes: Secondary | ICD-10-CM

## 2020-11-16 DIAGNOSIS — I10 Essential (primary) hypertension: Secondary | ICD-10-CM

## 2020-11-16 DIAGNOSIS — G8929 Other chronic pain: Secondary | ICD-10-CM

## 2020-11-16 DIAGNOSIS — E782 Mixed hyperlipidemia: Secondary | ICD-10-CM

## 2020-11-16 DIAGNOSIS — E039 Hypothyroidism, unspecified: Secondary | ICD-10-CM | POA: Diagnosis not present

## 2020-11-16 DIAGNOSIS — Z936 Other artificial openings of urinary tract status: Secondary | ICD-10-CM | POA: Diagnosis not present

## 2020-11-16 DIAGNOSIS — R609 Edema, unspecified: Secondary | ICD-10-CM

## 2020-11-16 DIAGNOSIS — K6289 Other specified diseases of anus and rectum: Secondary | ICD-10-CM | POA: Diagnosis not present

## 2020-11-16 MED ORDER — OXYCODONE HCL 5 MG PO TABS: 5.0000 mg | ORAL_TABLET | Freq: Three times a day (TID) | ORAL | 0 refills | Status: DC | PRN

## 2020-11-16 MED ORDER — MORPHINE SULFATE ER 15 MG PO TBCR: 15.0000 mg | EXTENDED_RELEASE_TABLET | Freq: Two times a day (BID) | ORAL | 0 refills | Status: DC

## 2020-11-16 MED ORDER — NORTRIPTYLINE HCL 25 MG PO CAPS: 25.0000 mg | ORAL_CAPSULE | Freq: Every day | ORAL | 3 refills | Status: DC

## 2020-11-16 NOTE — Progress Notes (Signed)
Subjective:    Patient ID: [name], female    DOB: [birth year], 70 y.o.   MRN: [id number]  HPI  Patient is a very pleasant 70 year old African-American female who is here today to establish care with me.  She has a very complicated past medical history.  She states that in the late 90s around 1998 she had back surgery.  At that time she had a accidental perforation in her intestines during the back surgery that required surgical correction.  Then in 2012 she was found to have bladder cancer.  She underwent surgical treatment of her bladder cancer and suffered a perforation to her intestines as well.  Ultimately she developed a small bowel obstruction due to adhesions and was unable to eat.  She states she lost more than 100 pounds.  She ultimately was admitted to [facility] and underwent TPN.  She required TPN for 3 years from 2012 for 2015 due to inability to tolerate oral food because of bowel obstructions.  Ultimately she underwent surgery in 2015 after she developed a fistula at the stoma of her bladder after her bladder was removed due to bladder cancer.  During the surgery to correct her urostomy, the patient also went lysis of adhesions.  At that point the bowel obstruction resolved and the patient was able to start tolerating oral food again.  She is on Eliquis due to recurrent DVTs in her upper extremities stemming from her long-term use of TPN.  She is on chronic pain medication.  Patient has terrible rectal pain.  She states that it feels like she is being torn in half every time she has a bowel movement.  She had pelvic floor muscular dysfunction due to the severity of the pain and she is working with a specialist in [place].  Prior to working with a specialist for pelvic floor dysfunction, the patient was having 30 bowel movements a day that were searing and tearing with pain.  She has seen numerous specialist who "cannot find anything wrong" however she continues to have horrific  pain with defecation.  However the specialist has been able to reduce her frequency of BMs to 5 or 6 a day which is made her quality of life much better.  She still takes MS Contin twice a day and oxycodone 2-3 times a day due to the severity of pain.  A lot of her pain sounds neuropathic in nature versus also rectal fissures due to the end complete relaxation of the anal sphincter.  She has not tried nitroglycerin ointment.  She is not try diltiazem gel.  She is not tried gabapentin or Lyrica or nortriptyline.  Past Medical History:  Diagnosis Date  . Acid reflux   . Bowel obstruction (HCC) several   Recurrent SBO secondary to adhesions  . Cancer [facility]) 2011 [facility])   diagnosed in 2009 per pt. Invasive High grade Urothelial carcinoma- s/p radiation and Chemo   . Chronic back pain   . DVT (deep venous thrombosis) ([place]) 2015  . Fatty liver   . Gout   . Hyperlipidemia   . Hypertension    pt says taken off medication since has lost weight.  . Hypothyroidism   . Internal hemorrhoids   . Leukopenia 12/06/2011   HIV serology negative  . Malnutrition ([place])   . PE (pulmonary embolism)    PER [facility] records  . Rectal ulcer 2016   [facility]  . UTI (lower urinary tract infection)    Recurrent  Past Surgical History:  Procedure Laterality Date  . ABDOMINAL SURGERY     with intestinal "puncture" x 2, exploratory surgery   . BACK SURGERY     X2  . BLADDER REMOVAL    . CHOLECYSTECTOMY    . CYSTECTOMY     breast  . HERNIA REPAIR     mesh  . ILEO CONDUIT     For bladder cancer  . PORTACATH PLACEMENT     Current Outpatient Medications on File Prior to Visit  Medication Sig Dispense Refill  . Ascorbic Acid (VITAMIN C PO) Take 1 tablet by mouth daily.    . colestipol (COLESTID) 1 g tablet Take by mouth.    . Cyanocobalamin 1000 MCG CAPS Take 1 tablet by mouth daily. (Patient not taking: Reported on 08/31/2020) 90 capsule 3  . DEXILANT 60 MG capsule TAKE 1 CAPSULE BY MOUTH  DAILY  90 capsule 3  . diclofenac Sodium (VOLTAREN) 1 % GEL APPLY TO AFFECTED AREAS 3 TIMES DAILY AS NEEDED. 100 g 0  . diphenoxylate-atropine (LOMOTIL) 2.5-0.025 MG tablet Take 1 tablet by mouth 4 (four) times daily as needed for diarrhea or loose stools. 120 tablet 2  . ELIQUIS 5 MG TABS tablet TAKE 1 TABLET BY MOUTH  TWICE DAILY 180 tablet 3  . Ergocalciferol (VITAMIN D2) 50 MCG (2000 UT) TABS tAKE 1 Tablet daily 30 tablet   . furosemide (LASIX) 40 MG tablet TAKE ONE TABLET BY MOUTH  TWICE DAILY AS NEEDED FOR  FLUID 180 tablet 3  . hydrocortisone 2.5 % cream APPLY RECTALLY TWICE DAILY AS NEEDED- please dispense with rectal tip 28.35 g 11  . levothyroxine (SYNTHROID) 150 MCG tablet TAKE 1 TABLET BY MOUTH  DAILY BEFORE BREAKFAST 90 tablet 3  . lidocaine (XYLOCAINE) 5 % ointment APPLY TOPICALLY 4 TIMES  DAILY AS NEEDED 318.96 g 1  . magnesium oxide (MAG-OX) 400 MG tablet Take by mouth.    . Magnesium Oxide 400 (240 Mg) MG TABS Take 1.5 tablet BID 90 tablet 2  . morphine (MS CONTIN) 15 MG 12 hr tablet Take 1 tablet (15 mg total) by mouth every 12 (twelve) hours. 60 tablet 0  . Multiple Vitamin (MULTI-VITAMIN) tablet Take 2 tablets by mouth daily.    . Multiple Vitamins-Minerals (MULTIVITAMIN ADULT, MINERALS,) TABS Take 1 tablet by mouth daily.    . Multiple Vitamins-Minerals (MULTIVITAMIN PO) Take 1 tablet by mouth daily.    . ondansetron (ZOFRAN-ODT) 8 MG disintegrating tablet DISSOLVE 1 TABLET ON THE  TONGUE EVERY 8 HOURS AS  NEEDED FOR NAUSEA 90 tablet 3  . oxyCODONE (OXY IR/ROXICODONE) 5 MG immediate release tablet Take 1 tablet (5 mg total) by mouth every 8 (eight) hours as needed for severe pain. 90 tablet 0  . potassium chloride SA (KLOR-CON) 20 MEQ tablet TAKE 1 TABLET BY MOUTH  DAILY 90 tablet 3  . pravastatin (PRAVACHOL) 20 MG tablet TAKE 1 TABLET BY MOUTH  DAILY FOR CHOLESTEROL 90 tablet 3  . tiZANidine (ZANAFLEX) 4 MG tablet Take 1 tablet (4 mg total) by mouth every 6 (six) hours as needed for  muscle spasms. 30 tablet 0  . XIFAXAN 550 MG TABS tablet Take 550 mg by mouth 3 (three) times daily.     No current facility-administered medications on file prior to visit.   Allergies  Allergen Reactions  . Bactrim [Sulfamethoxazole-Trimethoprim]   . Codeine Hives  . Lactose Diarrhea  . Nitrofuran Derivatives Itching  . Sulfa Antibiotics Hives  . Penicillins Rash  Has patient had a PCN reaction causing immediate rash, facial/tongue/throat swelling, SOB or lightheadedness with hypotension: No Has patient had a PCN reaction causing severe rash involving mucus membranes or skin necrosis: No Has patient had a PCN reaction that required hospitalization: No Has patient had a PCN reaction occurring within the last 10 years: No If all of the above answers are "NO", then may proceed with Cephalosporin use.  Childhood rash - no adult reactions known   Social History   Socioeconomic History  . Marital status: Widowed    Spouse name: Not on file  . Number of children: 2  . Years of education: Not on file  . Highest education level: Not on file  Occupational History    Employer: UNEMPLOYED  Tobacco Use  . Smoking status: Former Smoker    Packs/day: 0.50    Years: 25.00    Pack years: 12.50    Types: Cigarettes    Quit date: 10/03/2016    Years since quitting: 4.1  . Smokeless tobacco: Never Used  Vaping Use  . Vaping Use: Never used  Substance and Sexual Activity  . Alcohol use: No  . Drug use: No  . Sexual activity: Yes    Birth control/protection: Post-menopausal  Other Topics Concern  . Not on file  Social History Narrative  . Not on file   Social Determinants of Health   Financial Resource Strain: Not on file  Food Insecurity: Not on file  Transportation Needs: Not on file  Physical Activity: Not on file  Stress: Not on file  Social Connections: Not on file  Intimate Partner Violence: Not on file       Review of Systems  All other systems reviewed and are  negative.      Objective:   Physical Exam Vitals reviewed.  Eyes:     General: No scleral icterus.       Right eye: No discharge.        Left eye: No discharge.     Conjunctiva/sclera: Conjunctivae normal.     Pupils: Pupils are equal, round, and reactive to light.  Cardiovascular:     Rate and Rhythm: Normal rate and regular rhythm.     Heart sounds: Normal heart sounds. No murmur heard.   Pulmonary:     Effort: Pulmonary effort is normal. No respiratory distress.     Breath sounds: Normal breath sounds. No wheezing or rales.  Chest:     Chest wall: No tenderness.  Abdominal:     General: Bowel sounds are normal. There is no distension.     Palpations: Abdomen is soft. There is no mass.     Tenderness: There is no abdominal tenderness. There is no guarding or rebound.  Skin:    General: Skin is warm.     Findings: No erythema or rash.  Neurological:     Mental Status: She is alert and oriented to person, place, and time.     Cranial Nerves: No cranial nerve deficit.     Motor: No abnormal muscle tone.     Coordination: Coordination normal.     Deep Tendon Reflexes: Reflexes are normal and symmetric.    No bruit       Assessment & Plan:  Hypothyroidism, unspecified type - Plan: CBC with Differential/Platelet, COMPLETE METABOLIC PANEL WITH GFR, Lipid panel, Hemoglobin A1c, TSH  Peripheral edema  Borderline diabetes - Plan: CBC with Differential/Platelet, COMPLETE METABOLIC PANEL WITH GFR, Lipid panel, Hemoglobin A1c, TSH  Essential hypertension -  Plan: CBC with Differential/Platelet, COMPLETE METABOLIC PANEL WITH GFR, Lipid panel, Hemoglobin A1c, TSH  Mixed hyperlipidemia  Presence of urostomy (HCC)  Chronic rectal pain  I would like to try to improve the patient's rectal pain if possible.  I will continue her current pain medication as prescribed by my former partner.  However I recommended trying nortriptyline 25 mg p.o. nightly.  I have seen success with  this and vulvodynia and I question if similar result may be seen with some of this rectal pain which is out of proportion to exam findings.  The other possibility would be trying diltiazem gel or nitroglycerin gel around the rectum to help relax the anal sphincter to avoid anal fissures.  Return fasting for a CBC, CMP, lipid panel, A1c, and TSH.  Reassess in 1 month to see if nortriptyline has helped.  If not consider gabapentin or Lyrica

## 2020-11-20 ENCOUNTER — Encounter: Payer: Self-pay | Admitting: Nutrition

## 2020-11-20 ENCOUNTER — Encounter: Payer: Medicare Other | Attending: Family Medicine | Admitting: Nutrition

## 2020-11-20 VITALS — Ht 65.5 in | Wt 196.5 lb

## 2020-11-20 DIAGNOSIS — I10 Essential (primary) hypertension: Secondary | ICD-10-CM

## 2020-11-20 DIAGNOSIS — K529 Noninfective gastroenteritis and colitis, unspecified: Secondary | ICD-10-CM

## 2020-11-20 DIAGNOSIS — E782 Mixed hyperlipidemia: Secondary | ICD-10-CM

## 2020-11-20 DIAGNOSIS — K760 Fatty (change of) liver, not elsewhere classified: Secondary | ICD-10-CM

## 2020-11-20 DIAGNOSIS — R739 Hyperglycemia, unspecified: Secondary | ICD-10-CM | POA: Insufficient documentation

## 2020-11-20 DIAGNOSIS — E669 Obesity, unspecified: Secondary | ICD-10-CM

## 2020-11-20 NOTE — Progress Notes (Signed)
  Medical Nutrition Therapy:  Appt start time: 1300 end time:  1315   Assessment:  Primary concerns today:  Chronic diarrhea-doing much better., urostomy,  She lives by herself. Widow, Now taking Vit B 12 pills instead of injections. Just started on Pamelor 25 mg daily at night for muscle relaxation to help with her chronic diarrhea. Walking 1- 1/2 miles every day. Feels a lot better. Lost 5 lbs in the last month. [facility] and activiites.   Has cut out sodas and cut down on sweets and reduced her bread intake . Increased water to 5 bottles per day.   Wt Readings from Last 3 Encounters:  11/20/20 196 lb 8 oz (89.1 kg)  11/16/20 195 lb (88.5 kg)  10/17/20 201 lb 6.4 oz (91.4 kg)   Ht Readings from Last 3 Encounters:  11/20/20 5' 5.5" (1.664 m)  11/16/20 5' 5.5" (1.664 m)  10/17/20 5' 5.5" (1.664 m)   Body mass index is 32.2 kg/m. @BMIFA @ Facility age limit for growth percentiles is 20 years. Facility age limit for growth percentiles is 20 years.    Lab Results  Component Value Date   HGBA1C 5.9 (H) 04/04/2020   CMP Latest Ref Rng & Units 08/15/2020 04/04/2020 03/30/2020  Glucose 65 - 99 mg/dL 133(H) 104(H) 112(H)  BUN 7 - 25 mg/dL 17 18 16   Creatinine 0.50 - 0.99 mg/dL 1.39(H) 1.33(H) 1.12(H)  Sodium 135 - 146 mmol/L 143 138 141  Potassium 3.5 - 5.3 mmol/L 3.5 3.8 3.7  Chloride 98 - 110 mmol/L 107 102 103  CO2 20 - 32 mmol/L 24 26 23   Calcium 8.6 - 10.4 mg/dL 9.2 9.3 9.5  Total Protein 6.1 - 8.1 g/dL 7.0 7.2 -  Total Bilirubin 0.2 - 1.2 mg/dL 0.6 0.6 -  Alkaline Phos 38 - 126 U/L - - -  AST 10 - 35 U/L 13 14 -  ALT 6 - 29 U/L 11 10 -    Preferred Learning Style:  No preference indicated   Learning Readiness:   Ready  Change in progress   MEDICATIONS:   DIETARY INTAKE: .B) skipped L) chicken sandwich on wheat bread, white beans, baked potatoes, milk, Apple juice D)CHicken, slaw, pinto beans and water   Usual physical  activity: ADL  Estimated energy needs: 1500  calories 170  g carbohydrates 112 g protein 42 g fat  Progress Towards Goal(s):  In progress.   Nutritional Diagnosis:  NI-5.11.1 Predicted suboptimal nutrient intake As related to bowel issues and malabsorption   As evidenced by Low potassium, Low magnesium levels and chronic diarrhea >15 times per day.     Intervention:  FODMAP diet. Focusing on eating more nutrient dense foods, avoiding high fat, high sugar and processed foods. Drinking only water.  Goals  DIETARY INTAKE  Goals  Increase walking to 2 miles a day Increase vegetables and fresh fruits Keep drink water Avoid eating after 7 pm. Lose 5 lbs a month.  Teaching Method Utilized:  Visual Auditory Hands on  Handouts given during visit include:  The Plate Method   FODMAP   Barriers to learning/adherence to lifestyle change: none  Demonstrated degree of understanding via:  Teach Back   Monitoring/Evaluation:  Dietary intake, exercise, , and body weight in 1 month(s).

## 2020-11-20 NOTE — Patient Instructions (Signed)
Goals  Increase walking to 2 miles a day Increase vegetables and fresh fruits Keep drink water Avoid eating after 7 pm. Lose 5 lbs a month.

## 2020-11-29 ENCOUNTER — Other Ambulatory Visit: Payer: Medicare Other

## 2020-11-29 ENCOUNTER — Other Ambulatory Visit: Payer: Self-pay

## 2020-11-30 ENCOUNTER — Other Ambulatory Visit: Payer: Medicare Other

## 2020-11-30 DIAGNOSIS — E039 Hypothyroidism, unspecified: Secondary | ICD-10-CM | POA: Diagnosis not present

## 2020-11-30 DIAGNOSIS — R7303 Prediabetes: Secondary | ICD-10-CM | POA: Diagnosis not present

## 2020-11-30 DIAGNOSIS — I10 Essential (primary) hypertension: Secondary | ICD-10-CM | POA: Diagnosis not present

## 2020-12-01 LAB — COMPLETE METABOLIC PANEL WITH GFR
AG Ratio: 1.3 (calc) (ref 1.0–2.5)
ALT: 13 U/L (ref 6–29)
AST: 13 U/L (ref 10–35)
Albumin: 4.1 g/dL (ref 3.6–5.1)
Alkaline phosphatase (APISO): 130 U/L (ref 37–153)
BUN/Creatinine Ratio: 14 (calc) (ref 6–22)
BUN: 18 mg/dL (ref 7–25)
CO2: 25 mmol/L (ref 20–32)
Calcium: 9.1 mg/dL (ref 8.6–10.4)
Chloride: 103 mmol/L (ref 98–110)
Creat: 1.25 mg/dL — ABNORMAL HIGH (ref 0.50–0.99)
GFR, Est African American: 51 mL/min/{1.73_m2} — ABNORMAL LOW (ref >=60)
GFR, Est Non African American: 44 mL/min/{1.73_m2} — ABNORMAL LOW (ref >=60)
Globulin: 3.1 g/dL (calc) (ref 1.9–3.7)
Glucose, Bld: 108 mg/dL — ABNORMAL HIGH (ref 65–99)
Potassium: 3.9 mmol/L (ref 3.5–5.3)
Sodium: 138 mmol/L (ref 135–146)
Total Bilirubin: 0.6 mg/dL (ref 0.2–1.2)
Total Protein: 7.2 g/dL (ref 6.1–8.1)

## 2020-12-01 LAB — CBC WITH DIFFERENTIAL/PLATELET
Absolute Monocytes: 799 cells/uL (ref 200–950)
Basophils Absolute: 30 cells/uL (ref 0–200)
Basophils Relative: 0.4 %
Eosinophils Absolute: 266 cells/uL (ref 15–500)
Eosinophils Relative: 3.6 %
HCT: 38.8 % (ref 35.0–45.0)
Hemoglobin: 12.4 g/dL (ref 11.7–15.5)
Lymphs Abs: 1717 cells/uL (ref 850–3900)
MCH: 25 pg — ABNORMAL LOW (ref 27.0–33.0)
MCHC: 32 g/dL (ref 32.0–36.0)
MCV: 78.2 fL — ABNORMAL LOW (ref 80.0–100.0)
MPV: 10.2 fL (ref 7.5–12.5)
Monocytes Relative: 10.8 %
Neutro Abs: 4588 cells/uL (ref 1500–7800)
Neutrophils Relative %: 62 %
Platelets: 339 10*3/uL (ref 140–400)
RBC: 4.96 10*6/uL (ref 3.80–5.10)
RDW: 15.6 % — ABNORMAL HIGH (ref 11.0–15.0)
Total Lymphocyte: 23.2 %
WBC: 7.4 10*3/uL (ref 3.8–10.8)

## 2020-12-01 LAB — LIPID PANEL
Cholesterol: 147 mg/dL (ref <200)
HDL: 42 mg/dL — ABNORMAL LOW (ref >=50)
LDL Cholesterol (Calc): 72 mg/dL (calc)
Non-HDL Cholesterol (Calc): 105 mg/dL (calc) (ref <130)
Total CHOL/HDL Ratio: 3.5 (calc) (ref <5.0)
Triglycerides: 240 mg/dL — ABNORMAL HIGH (ref <150)

## 2020-12-01 LAB — HEMOGLOBIN A1C
Hgb A1c MFr Bld: 6 % of total Hgb — ABNORMAL HIGH (ref <5.7)
Mean Plasma Glucose: 126 mg/dL
eAG (mmol/L): 7 mmol/L

## 2020-12-01 LAB — TSH: TSH: 1.68 mIU/L (ref 0.40–4.50)

## 2020-12-05 ENCOUNTER — Encounter: Payer: Self-pay | Admitting: *Deleted

## 2020-12-08 ENCOUNTER — Other Ambulatory Visit: Payer: Self-pay | Admitting: *Deleted

## 2020-12-08 DIAGNOSIS — Z936 Other artificial openings of urinary tract status: Secondary | ICD-10-CM | POA: Diagnosis not present

## 2020-12-08 MED ORDER — PRAVASTATIN SODIUM 20 MG PO TABS: 20.0000 mg | ORAL_TABLET | Freq: Every day | ORAL | 3 refills | Status: DC

## 2020-12-08 MED ORDER — DEXLANSOPRAZOLE 60 MG PO CPDR: 1.0000 | DELAYED_RELEASE_CAPSULE | Freq: Every day | ORAL | 3 refills | Status: DC

## 2020-12-12 DIAGNOSIS — M6289 Other specified disorders of muscle: Secondary | ICD-10-CM | POA: Diagnosis not present

## 2020-12-12 DIAGNOSIS — K529 Noninfective gastroenteritis and colitis, unspecified: Secondary | ICD-10-CM | POA: Diagnosis not present

## 2020-12-14 DIAGNOSIS — Z936 Other artificial openings of urinary tract status: Secondary | ICD-10-CM | POA: Diagnosis not present

## 2020-12-15 ENCOUNTER — Other Ambulatory Visit: Payer: Self-pay

## 2020-12-15 MED ORDER — MORPHINE SULFATE ER 15 MG PO TBCR: 15.0000 mg | EXTENDED_RELEASE_TABLET | Freq: Two times a day (BID) | ORAL | 0 refills | Status: DC

## 2020-12-15 MED ORDER — OXYCODONE HCL 5 MG PO TABS: 5.0000 mg | ORAL_TABLET | Freq: Three times a day (TID) | ORAL | 0 refills | Status: DC | PRN

## 2020-12-22 ENCOUNTER — Telehealth: Payer: Self-pay | Admitting: Family Medicine

## 2020-12-22 NOTE — Telephone Encounter (Signed)
Advise given for Miralax. States that she will contact GI.

## 2020-12-22 NOTE — Telephone Encounter (Signed)
Call placed to patient.   Reports that she is having 4-5 BM every day, but they are hard and painful. States that she has been taking laxatives, but it has not softened stool.   Advised to increase water intake. Advised if BM stop completely, go to ER for evaluation.  Advised to contact GI office for further instructions.

## 2020-12-22 NOTE — Telephone Encounter (Signed)
Patient complaining of constipation; stools impacted. Patient is taking stool softeners and laxatives; stool is very hard and patient senses a blockage. Patient requesting the provider sends in an Rx asap. Please advise at [phone] .

## 2020-12-26 ENCOUNTER — Other Ambulatory Visit: Payer: Self-pay | Admitting: *Deleted

## 2020-12-26 MED ORDER — FUROSEMIDE 40 MG PO TABS
40.0000 mg | ORAL_TABLET | Freq: Two times a day (BID) | ORAL | 3 refills | Status: DC | PRN
Start: 2020-12-26 — End: 2021-11-27

## 2020-12-28 DIAGNOSIS — Z8551 Personal history of malignant neoplasm of bladder: Secondary | ICD-10-CM | POA: Diagnosis not present

## 2020-12-28 DIAGNOSIS — K5900 Constipation, unspecified: Secondary | ICD-10-CM | POA: Diagnosis not present

## 2021-01-15 DIAGNOSIS — Z936 Other artificial openings of urinary tract status: Secondary | ICD-10-CM | POA: Diagnosis not present

## 2021-01-16 ENCOUNTER — Other Ambulatory Visit: Payer: Self-pay | Admitting: *Deleted

## 2021-01-16 MED ORDER — OXYCODONE HCL 5 MG PO TABS: 5.0000 mg | ORAL_TABLET | Freq: Three times a day (TID) | ORAL | 0 refills | Status: DC | PRN

## 2021-01-16 MED ORDER — MORPHINE SULFATE ER 15 MG PO TBCR: 15.0000 mg | EXTENDED_RELEASE_TABLET | Freq: Two times a day (BID) | ORAL | 0 refills | Status: DC

## 2021-01-16 NOTE — Telephone Encounter (Signed)
Received call from patient.   Requested refill on MS Contin and Oxycodone.   Ok to refill??  Last office visit 11/16/2020.  Last refill 12/15/2020 on both.

## 2021-01-18 DIAGNOSIS — N390 Urinary tract infection, site not specified: Secondary | ICD-10-CM | POA: Diagnosis not present

## 2021-01-18 DIAGNOSIS — R350 Frequency of micturition: Secondary | ICD-10-CM | POA: Diagnosis not present

## 2021-01-18 DIAGNOSIS — Z20822 Contact with and (suspected) exposure to covid-19: Secondary | ICD-10-CM | POA: Diagnosis not present

## 2021-01-22 DIAGNOSIS — I10 Essential (primary) hypertension: Secondary | ICD-10-CM | POA: Diagnosis not present

## 2021-01-22 DIAGNOSIS — Z9221 Personal history of antineoplastic chemotherapy: Secondary | ICD-10-CM | POA: Diagnosis not present

## 2021-01-22 DIAGNOSIS — Z87891 Personal history of nicotine dependence: Secondary | ICD-10-CM | POA: Diagnosis not present

## 2021-01-22 DIAGNOSIS — U071 COVID-19: Secondary | ICD-10-CM | POA: Diagnosis not present

## 2021-01-22 DIAGNOSIS — J029 Acute pharyngitis, unspecified: Secondary | ICD-10-CM | POA: Diagnosis not present

## 2021-01-22 DIAGNOSIS — R079 Chest pain, unspecified: Secondary | ICD-10-CM | POA: Diagnosis not present

## 2021-01-24 ENCOUNTER — Other Ambulatory Visit: Payer: Self-pay | Admitting: *Deleted

## 2021-01-24 ENCOUNTER — Other Ambulatory Visit: Payer: Self-pay

## 2021-01-24 MED ORDER — LIDOCAINE 5 % EX OINT: TOPICAL_OINTMENT | CUTANEOUS | 1 refills | Status: DC

## 2021-02-02 DIAGNOSIS — H905 Unspecified sensorineural hearing loss: Secondary | ICD-10-CM | POA: Diagnosis not present

## 2021-02-15 DIAGNOSIS — Z936 Other artificial openings of urinary tract status: Secondary | ICD-10-CM | POA: Diagnosis not present

## 2021-02-16 ENCOUNTER — Other Ambulatory Visit: Payer: Self-pay

## 2021-02-16 MED ORDER — OXYCODONE HCL 5 MG PO TABS: 5.0000 mg | ORAL_TABLET | Freq: Three times a day (TID) | ORAL | 0 refills | Status: DC | PRN

## 2021-02-16 MED ORDER — MORPHINE SULFATE ER 15 MG PO TBCR: 15.0000 mg | EXTENDED_RELEASE_TABLET | Freq: Two times a day (BID) | ORAL | 0 refills | Status: DC

## 2021-02-16 NOTE — Telephone Encounter (Signed)
Pt called in requesting a refill of oxyCODONE (OXY IR/ROXICODONE) 5 MG immediate and morphine (MS CONTIN) 15 MG 12 hr tablet. Please advise.  Cb#: [phone]

## 2021-02-16 NOTE — Telephone Encounter (Signed)
Ok to refill??  Last office visit 11/16/2020.  Last refill 01/16/2021 on both.

## 2021-02-20 ENCOUNTER — Ambulatory Visit: Payer: Medicare Other | Admitting: Nutrition

## 2021-03-06 ENCOUNTER — Encounter: Payer: Medicare Other | Attending: Family Medicine | Admitting: Nutrition

## 2021-03-06 ENCOUNTER — Encounter: Payer: Self-pay | Admitting: Nutrition

## 2021-03-06 VITALS — Ht 65.5 in | Wt 194.8 lb

## 2021-03-06 DIAGNOSIS — R7303 Prediabetes: Secondary | ICD-10-CM

## 2021-03-06 DIAGNOSIS — I10 Essential (primary) hypertension: Secondary | ICD-10-CM

## 2021-03-06 DIAGNOSIS — E669 Obesity, unspecified: Secondary | ICD-10-CM

## 2021-03-06 DIAGNOSIS — K760 Fatty (change of) liver, not elsewhere classified: Secondary | ICD-10-CM

## 2021-03-06 DIAGNOSIS — E782 Mixed hyperlipidemia: Secondary | ICD-10-CM

## 2021-03-06 DIAGNOSIS — K529 Noninfective gastroenteritis and colitis, unspecified: Secondary | ICD-10-CM

## 2021-03-06 NOTE — Patient Instructions (Signed)
Goals  Cut out processed foods-hotdogs and bacon, fried, Increase fruits and vegetables, Increase exercise walking 30 minutes 5 times per week Lose 1 lb per week.

## 2021-03-06 NOTE — Progress Notes (Signed)
  Medical Nutrition Therapy:  Appt start time: 1510 end time:  [id number] Follow up  Assessment:  Primary concerns today: FOllow up Her diet and bowels have been much better. Not having much diarrhea. Use to drink a lot of water and since COVID, she hasn't been as much. Current diet is showing she is still eating a lot of high fat processed foods. Diet is low in fresh fruits and vegetables and grains. Doesn't tolerate some foods due to GI Issues causing diarrhea. Has a Urostomy. A lot less stools and Diarrhea since modifying her diet. Still room for improvement. Wants to get back to walking and wants to lose some weight. She is tolerating some dried beans and higher fiber foods gradually. Watermelon she notes caused a lot of diarrhea. Prediabetic with A1C 6.%. PMH: Fatty liver, Hyperlipidemia, HTN, Obesity,   Wt Readings from Last 3 Encounters:  03/06/21 194 lb 12.8 oz (88.4 kg)  11/20/20 196 lb 8 oz (89.1 kg)  11/16/20 195 lb (88.5 kg)   Ht Readings from Last 3 Encounters:  03/06/21 5' 5.5" (1.664 m)  11/20/20 5' 5.5" (1.664 m)  11/16/20 5' 5.5" (1.664 m)   Body mass index is 31.92 kg/m. '@BMIFA'$ @ Facility age limit for growth percentiles is 20 years. Facility age limit for growth percentiles is 20 years.    Lab Results  Component Value Date   HGBA1C 6.0 (H) 11/30/2020   CMP Latest Ref Rng & Units 11/30/2020 08/15/2020 04/04/2020  Glucose 65 - 99 mg/dL 108(H) 133(H) 104(H)  BUN 7 - 25 mg/dL '18 17 18  '$ Creatinine 0.50 - 0.99 mg/dL 1.25(H) 1.39(H) 1.33(H)  Sodium 135 - 146 mmol/L 138 143 138  Potassium 3.5 - 5.3 mmol/L 3.9 3.5 3.8  Chloride 98 - 110 mmol/L 103 107 102  CO2 20 - 32 mmol/L '25 24 26  '$ Calcium 8.6 - 10.4 mg/dL 9.1 9.2 9.3  Total Protein 6.1 - 8.1 g/dL 7.2 7.0 7.2  Total Bilirubin 0.2 - 1.2 mg/dL 0.6 0.6 0.6  Alkaline Phos 38 - 126 U/L - - -  AST 10 - 35 U/L '13 13 14  '$ ALT 6 - 29 U/L '13 11 10    '$ Preferred Learning Style: No preference indicated   Learning Readiness:   Ready Change in progress   MEDICATIONS:   DIETARY INTAKE: .Eating some better balanced meals. Eats out often with daughter on weekends.   Usual physical activity: ADL  Estimated energy needs: 1500  calories 170  g carbohydrates 112 g protein 42 g fat  Progress Towards Goal(s):  In progress.   Nutritional Diagnosis:  NI-5.11.1 Predicted suboptimal nutrient intake As related to bowel issues and malabsorption   As evidenced by Low potassium, Low magnesium levels and chronic diarrhea >15 times per day.     Intervention:  FODMAP diet. Focusing on eating more nutrient dense foods, avoiding high fat, high sugar and processed foods. Drinking only water. Rncouraged more plant based foods and avoiding processed high fat foods.    DIETARY INTAKE .Goals  Cut out processed foods-hotdogs and bacon, fried, Increase fruits and vegetables, Increase exercise walking 30 minutes 5 times per week Lose 1 lb per week.  Teaching Method Utilized:  Visual Auditory Hands on  Handouts given during visit include: Heart Healthy foods High TG diet  Barriers to learning/adherence to lifestyle change: none  Demonstrated degree of understanding via:  Teach Back   Monitoring/Evaluation:  Dietary intake, exercise, , and body weight in 4 month(s).

## 2021-03-11 DIAGNOSIS — M7711 Lateral epicondylitis, right elbow: Secondary | ICD-10-CM | POA: Diagnosis not present

## 2021-03-11 DIAGNOSIS — Z880 Allergy status to penicillin: Secondary | ICD-10-CM | POA: Diagnosis not present

## 2021-03-11 DIAGNOSIS — Z87891 Personal history of nicotine dependence: Secondary | ICD-10-CM | POA: Diagnosis not present

## 2021-03-11 DIAGNOSIS — Z885 Allergy status to narcotic agent status: Secondary | ICD-10-CM | POA: Diagnosis not present

## 2021-03-11 DIAGNOSIS — E785 Hyperlipidemia, unspecified: Secondary | ICD-10-CM | POA: Diagnosis not present

## 2021-03-11 DIAGNOSIS — I10 Essential (primary) hypertension: Secondary | ICD-10-CM | POA: Diagnosis not present

## 2021-03-11 DIAGNOSIS — Z882 Allergy status to sulfonamides status: Secondary | ICD-10-CM | POA: Diagnosis not present

## 2021-03-11 DIAGNOSIS — E039 Hypothyroidism, unspecified: Secondary | ICD-10-CM | POA: Diagnosis not present

## 2021-03-11 DIAGNOSIS — Z79899 Other long term (current) drug therapy: Secondary | ICD-10-CM | POA: Diagnosis not present

## 2021-03-11 DIAGNOSIS — M79601 Pain in right arm: Secondary | ICD-10-CM | POA: Diagnosis not present

## 2021-03-12 DIAGNOSIS — M6289 Other specified disorders of muscle: Secondary | ICD-10-CM | POA: Diagnosis not present

## 2021-03-15 ENCOUNTER — Other Ambulatory Visit: Payer: Self-pay

## 2021-03-15 ENCOUNTER — Encounter: Payer: Self-pay | Admitting: Family Medicine

## 2021-03-15 ENCOUNTER — Ambulatory Visit (INDEPENDENT_AMBULATORY_CARE_PROVIDER_SITE_OTHER): Payer: Medicare Other | Admitting: Family Medicine

## 2021-03-15 VITALS — BP 142/68 | HR 94 | Temp 98.2°F | Resp 14 | Ht 65.5 in | Wt 193.0 lb

## 2021-03-15 DIAGNOSIS — M7711 Lateral epicondylitis, right elbow: Secondary | ICD-10-CM | POA: Diagnosis not present

## 2021-03-15 MED ORDER — MORPHINE SULFATE ER 15 MG PO TBCR: 15.0000 mg | EXTENDED_RELEASE_TABLET | Freq: Two times a day (BID) | ORAL | 0 refills | Status: DC

## 2021-03-15 MED ORDER — OXYCODONE HCL 5 MG PO TABS: 5.0000 mg | ORAL_TABLET | Freq: Three times a day (TID) | ORAL | 0 refills | Status: DC | PRN

## 2021-03-15 NOTE — Progress Notes (Signed)
Subjective:    Patient ID: [name], female    DOB: 04/30/1951, 70 y.o.   MRN: [id number]  HPI  11/16/20 Patient is a very pleasant 70 year old African-American female who is here today to establish care with me.  She has a very complicated past medical history.  She states that in the late 90s around 1998 she had back surgery.  At that time she had a accidental perforation in her intestines during the back surgery that required surgical correction.  Then in 2012 she was found to have bladder cancer.  She underwent surgical treatment of her bladder cancer and suffered a perforation to her intestines as well.  Ultimately she developed a small bowel obstruction due to adhesions and was unable to eat.  She states she lost more than 100 pounds.  She ultimately was admitted to [facility] and underwent TPN.  She required TPN for 3 years from 2012 for 2015 due to inability to tolerate oral food because of bowel obstructions.  Ultimately she underwent surgery in 2015 after she developed a fistula at the stoma of her bladder after her bladder was removed due to bladder cancer.  During the surgery to correct her urostomy, the patient also went lysis of adhesions.  At that point the bowel obstruction resolved and the patient was able to start tolerating oral food again.  She is on Eliquis due to recurrent DVTs in her upper extremities stemming from her long-term use of TPN.  She is on chronic pain medication.  Patient has terrible rectal pain.  She states that it feels like she is being torn in half every time she has a bowel movement.  She had pelvic floor muscular dysfunction due to the severity of the pain and she is working with a specialist in [place].  Prior to working with a specialist for pelvic floor dysfunction, the patient was having 30 bowel movements a day that were searing and tearing with pain.  She has seen numerous specialist who "cannot find anything wrong" however she continues to have  horrific pain with defecation.  However the specialist has been able to reduce her frequency of BMs to 5 or 6 a day which is made her quality of life much better.  She still takes MS Contin twice a day and oxycodone 2-3 times a day due to the severity of pain.  A lot of her pain sounds neuropathic in nature versus also rectal fissures due to the end complete relaxation of the anal sphincter.  She has not tried nitroglycerin ointment.  She is not try diltiazem gel.  She is not tried gabapentin or Lyrica or nortriptyline.  03/15/21 Patient was recently seen in an outside emergency room for pain around the right elbow.  She is tender to palpation over the right lateral epicondyle.  She is also tender to palpation over the tendinous insertion of the biceps on her volar right forearm.  There are some tenderness to palpation in the right antecubital fossa along the tendon.  It hurts for her to bend her elbow.  It hurts with palpation there and on the lateral epicondyle.  There is no redness.  There is no swelling.  There is no warmth.  Past Medical History:  Diagnosis Date   Acid reflux    Bowel obstruction ([place]) several   Recurrent SBO secondary to adhesions   Cancer [facility]) 2011 [facility])   diagnosed in 2009 per pt. Invasive High grade Urothelial carcinoma- s/p radiation and Chemo  Chronic back pain    DVT (deep venous thrombosis) ([place]) 2015   Fatty liver    Gout    Hyperlipidemia    Hypertension    pt says taken off medication since has lost weight.   Hypothyroidism    Internal hemorrhoids    Leukopenia 12/06/2011   HIV serology negative   Malnutrition ([place])    PE (pulmonary embolism)    [facility]   Rectal ulcer 2016   [facility]   UTI (lower urinary tract infection)    Recurrent   Past Surgical History:  Procedure Laterality Date   ABDOMINAL SURGERY     with intestinal "puncture" x 2, exploratory surgery    BACK SURGERY     X2   BLADDER REMOVAL     CHOLECYSTECTOMY      CYSTECTOMY     breast   HERNIA REPAIR     mesh   ILEO CONDUIT     For bladder cancer   PORTACATH PLACEMENT     Current Outpatient Medications on File Prior to Visit  Medication Sig Dispense Refill   Ascorbic Acid (VITAMIN C PO) Take 1 tablet by mouth daily.     colestipol (COLESTID) 1 g tablet Take by mouth.     Cyanocobalamin 1000 MCG CAPS Take 1 tablet by mouth daily. 90 capsule 3   dexlansoprazole (DEXILANT) 60 MG capsule Take 1 capsule (60 mg total) by mouth daily. 90 capsule 3   diclofenac Sodium (VOLTAREN) 1 % GEL APPLY TO AFFECTED AREAS 3 TIMES DAILY AS NEEDED. 100 g 0   diphenoxylate-atropine (LOMOTIL) 2.5-0.025 MG tablet Take 1 tablet by mouth 4 (four) times daily as needed for diarrhea or loose stools. (Patient not taking: Reported on 11/16/2020) 120 tablet 2   ELIQUIS 5 MG TABS tablet TAKE 1 TABLET BY MOUTH  TWICE DAILY 180 tablet 3   Ergocalciferol (VITAMIN D2) 50 MCG (2000 UT) TABS tAKE 1 Tablet daily 30 tablet    furosemide (LASIX) 40 MG tablet Take 1 tablet (40 mg total) by mouth 2 (two) times daily as needed for edema. 180 tablet 3   hydrocortisone 2.5 % cream APPLY RECTALLY TWICE DAILY AS NEEDED- please dispense with rectal tip 28.35 g 11   levothyroxine (SYNTHROID) 150 MCG tablet TAKE 1 TABLET BY MOUTH  DAILY BEFORE BREAKFAST 90 tablet 3   lidocaine (XYLOCAINE) 5 % ointment APPLY TOPICALLY 4 TIMES  DAILY AS NEEDED 318.96 g 1   magnesium oxide (MAG-OX) 400 MG tablet Take by mouth.     Magnesium Oxide 400 (240 Mg) MG TABS Take 1.5 tablet BID 90 tablet 2   morphine (MS CONTIN) 15 MG 12 hr tablet Take 1 tablet (15 mg total) by mouth every 12 (twelve) hours. 60 tablet 0   Multiple Vitamin (MULTI-VITAMIN) tablet Take 2 tablets by mouth daily.     Multiple Vitamins-Minerals (MULTIVITAMIN ADULT, MINERALS,) TABS Take 1 tablet by mouth daily.     Multiple Vitamins-Minerals (MULTIVITAMIN PO) Take 1 tablet by mouth daily.     nortriptyline (PAMELOR) 25 MG capsule Take 1 capsule  (25 mg total) by mouth at bedtime. 30 capsule 3   ondansetron (ZOFRAN-ODT) 8 MG disintegrating tablet DISSOLVE 1 TABLET ON THE  TONGUE EVERY 8 HOURS AS  NEEDED FOR NAUSEA 90 tablet 3   oxyCODONE (OXY IR/ROXICODONE) 5 MG immediate release tablet Take 1 tablet (5 mg total) by mouth every 8 (eight) hours as needed for severe pain. 90 tablet 0   potassium chloride SA (  KLOR-CON) 20 MEQ tablet TAKE 1 TABLET BY MOUTH  DAILY 90 tablet 3   pravastatin (PRAVACHOL) 20 MG tablet Take 1 tablet (20 mg total) by mouth daily. 90 tablet 3   tiZANidine (ZANAFLEX) 4 MG tablet Take 1 tablet (4 mg total) by mouth every 6 (six) hours as needed for muscle spasms. 30 tablet 0   XIFAXAN 550 MG TABS tablet Take 550 mg by mouth 3 (three) times daily.     No current facility-administered medications on file prior to visit.   Allergies  Allergen Reactions   Bactrim [Sulfamethoxazole-Trimethoprim]    Codeine Hives   Lactose Diarrhea   Nitrofuran Derivatives Itching   Sulfa Antibiotics Hives   Penicillins Rash    Has patient had a PCN reaction causing immediate rash, facial/tongue/throat swelling, SOB or lightheadedness with hypotension: No Has patient had a PCN reaction causing severe rash involving mucus membranes or skin necrosis: No Has patient had a PCN reaction that required hospitalization: No Has patient had a PCN reaction occurring within the last 10 years: No If all of the above answers are "NO", then may proceed with Cephalosporin use.  Childhood rash - no adult reactions known   Social History   Socioeconomic History   Marital status: Widowed    Spouse name: Not on file   Number of children: 2   Years of education: Not on file   Highest education level: Not on file  Occupational History    Employer: UNEMPLOYED  Tobacco Use   Smoking status: Former    Packs/day: 0.50    Years: 25.00    Pack years: 12.50    Types: Cigarettes    Quit date: 10/03/2016    Years since quitting: 4.4   Smokeless  tobacco: Never  Vaping Use   Vaping Use: Never used  Substance and Sexual Activity   Alcohol use: No   Drug use: No   Sexual activity: Yes    Birth control/protection: Post-menopausal  Other Topics Concern   Not on file  Social History Narrative   Not on file   Social Determinants of Health   Financial Resource Strain: Not on file  Food Insecurity: Not on file  Transportation Needs: Not on file  Physical Activity: Not on file  Stress: Not on file  Social Connections: Not on file  Intimate Partner Violence: Not on file       Review of Systems  All other systems reviewed and are negative.     Objective:   Physical Exam Vitals reviewed.  Eyes:     General: No scleral icterus.       Right eye: No discharge.        Left eye: No discharge.     Conjunctiva/sclera: Conjunctivae normal.     Pupils: Pupils are equal, round, and reactive to light.  Cardiovascular:     Rate and Rhythm: Normal rate and regular rhythm.     Heart sounds: Normal heart sounds. No murmur heard. Pulmonary:     Effort: Pulmonary effort is normal. No respiratory distress.     Breath sounds: Normal breath sounds. No wheezing or rales.  Chest:     Chest wall: No tenderness.  Abdominal:     General: Bowel sounds are normal. There is no distension.     Palpations: Abdomen is soft. There is no mass.     Tenderness: There is no abdominal tenderness. There is no guarding or rebound.  Musculoskeletal:     Left elbow: No swelling or  deformity. Normal range of motion. Tenderness present in lateral epicondyle.       Arms:  Skin:    General: Skin is warm.     Findings: No erythema or rash.  Neurological:     Mental Status: She is alert and oriented to person, place, and time.     Cranial Nerves: No cranial nerve deficit.     Motor: No abnormal muscle tone.     Coordination: Coordination normal.     Deep Tendon Reflexes: Reflexes are normal and symmetric.        Assessment & Plan:  Lateral  epicondylitis of right elbow Patient has lateral epicondylitis.  I offer the patient a cortisone injection for tennis elbow but she declines.  Instead she will try prednisone taper pack given to her in the emergency room.  We discussed an elbow strap and the appropriate way to wear this.  Also believe that the prednisone may help with the biceps tendinitis that she is also having.  I see no evidence of infection.  There is no significant swelling in the right arm so I do not feel that this has anything to do with a DVT which was the initial concern at a local urgent care

## 2021-03-21 DIAGNOSIS — Z936 Other artificial openings of urinary tract status: Secondary | ICD-10-CM | POA: Diagnosis not present

## 2021-03-28 ENCOUNTER — Ambulatory Visit (INDEPENDENT_AMBULATORY_CARE_PROVIDER_SITE_OTHER): Payer: Medicare Other | Admitting: Urology

## 2021-03-28 ENCOUNTER — Encounter: Payer: Self-pay | Admitting: Urology

## 2021-03-28 ENCOUNTER — Other Ambulatory Visit: Payer: Self-pay

## 2021-03-28 VITALS — BP 134/83 | HR 71 | Temp 98.2°F | Ht 65.5 in | Wt 191.0 lb

## 2021-03-28 DIAGNOSIS — C679 Malignant neoplasm of bladder, unspecified: Secondary | ICD-10-CM

## 2021-03-28 LAB — URINALYSIS, ROUTINE W REFLEX MICROSCOPIC
Bilirubin, UA: NEGATIVE
Glucose, UA: NEGATIVE
Ketones, UA: NEGATIVE
Nitrite, UA: NEGATIVE
Specific Gravity, UA: 1.02 (ref 1.005–1.030)
Urobilinogen, Ur: 0.2 mg/dL (ref 0.2–1.0)
pH, UA: 6 (ref 5.0–7.5)

## 2021-03-28 LAB — MICROSCOPIC EXAMINATION: Renal Epithel, UA: NONE SEEN /hpf

## 2021-03-28 NOTE — Progress Notes (Signed)
Urological Symptom Review  Patient is experiencing the following symptoms: N/a   Review of Systems  Gastrointestinal (upper)  : Negative for upper GI symptoms  Gastrointestinal (lower) : Constipation  Constitutional : Fatigue  Skin: Negative for skin symptoms  Eyes: Negative for eye symptoms  Ear/Nose/Throat : Negative for Ear/Nose/Throat symptoms  Hematologic/Lymphatic: Negative for Hematologic/Lymphatic symptoms  Cardiovascular : Negative for cardiovascular symptoms  Respiratory : Negative for respiratory symptoms  Endocrine: Negative for endocrine symptoms  Musculoskeletal: Joint pain  Neurological: Negative for neurological symptoms  Psychologic: Negative for psychiatric symptoms

## 2021-03-28 NOTE — Progress Notes (Signed)
03/28/2021 10:43 AM   [name] January 06, 1951 [id number]  Referring provider: Alycia Rossetti, MD [address] [place],  [place] [address]  Followup bladder cancer   HPI: [name] is a 34VQ here for followup for bladder cancer. She denies any hematuria or pelvic pain. She denies flank pain. Her complaint today is leaking from her urostomy device. She has not seen wound ostomy in several years. No other complaints today   PMH: Past Medical History:  Diagnosis Date   Acid reflux    Bowel obstruction ([place]) several   Recurrent SBO secondary to adhesions   Cancer [facility]) 2011 [facility])   diagnosed in 2009 per pt. Invasive High grade Urothelial carcinoma- s/p radiation and Chemo    Chronic back pain    DVT (deep venous thrombosis) ([place]) 2015   Fatty liver    Gout    Hyperlipidemia    Hypertension    pt says taken off medication since has lost weight.   Hypothyroidism    Internal hemorrhoids    Leukopenia 12/06/2011   HIV serology negative   Malnutrition ([place])    PE (pulmonary embolism)    [facility]   Rectal ulcer 2016   [facility]   UTI (lower urinary tract infection)    Recurrent    Surgical History: Past Surgical History:  Procedure Laterality Date   ABDOMINAL SURGERY     with intestinal "puncture" x 2, exploratory surgery    BACK SURGERY     X2   BLADDER REMOVAL     CHOLECYSTECTOMY     CYSTECTOMY     breast   HERNIA REPAIR     mesh   ILEO CONDUIT     For bladder cancer   PORTACATH PLACEMENT      Home Medications:  Allergies as of 03/28/2021       Reactions   Bactrim [sulfamethoxazole-trimethoprim]    Codeine Hives   Lactose Diarrhea   Nitrofuran Derivatives Itching   Sulfa Antibiotics Hives   Penicillins Rash   Has patient had a PCN reaction causing immediate rash, facial/tongue/throat swelling, SOB or lightheadedness with hypotension: No Has patient had a PCN reaction causing severe rash involving  mucus membranes or skin necrosis: No Has patient had a PCN reaction that required hospitalization: No Has patient had a PCN reaction occurring within the last 10 years: No If all of the above answers are "NO", then may proceed with Cephalosporin use. Childhood rash - no adult reactions known        Medication List        Accurate as of March 28, 2021 10:43 AM. If you have any questions, ask your nurse or doctor.          colestipol 1 g tablet Commonly known as: COLESTID Take by mouth.   Cyanocobalamin 1000 MCG Caps Take 1 tablet by mouth daily.   dexlansoprazole 60 MG capsule Commonly known as: Dexilant Take 1 capsule (60 mg total) by mouth daily.   diclofenac Sodium 1 % Gel Commonly known as: VOLTAREN APPLY TO AFFECTED AREAS 3 TIMES DAILY AS NEEDED.   diphenoxylate-atropine 2.5-0.025 MG tablet Commonly known as: Lomotil Take 1 tablet by mouth 4 (four) times daily as needed for diarrhea or loose stools.   Eliquis 5 MG Tabs tablet Generic drug: apixaban TAKE 1 TABLET BY MOUTH  TWICE DAILY   furosemide 40 MG tablet Commonly known as: LASIX Take 1 tablet (40 mg total) by mouth  2 (two) times daily as needed for edema.   hydrocortisone 2.5 % cream APPLY RECTALLY TWICE DAILY AS NEEDED- please dispense with rectal tip   levothyroxine 150 MCG tablet Commonly known as: SYNTHROID TAKE 1 TABLET BY MOUTH  DAILY BEFORE BREAKFAST   lidocaine 5 % ointment Commonly known as: XYLOCAINE APPLY TOPICALLY 4 TIMES  DAILY AS NEEDED   magnesium oxide 400 (240 Mg) MG tablet Commonly known as: MAG-OX Take 1.5 tablet BID   magnesium oxide 400 MG tablet Commonly known as: MAG-OX Take by mouth.   morphine 15 MG 12 hr tablet Commonly known as: Charlene CONTIN Take 1 tablet (15 mg total) by mouth every 12 (twelve) hours.   Multi-Vitamin tablet Take 2 tablets by mouth daily.   Multivitamin Adult (Minerals) Tabs Take 1 tablet by mouth daily.   MULTIVITAMIN PO Take 1 tablet by  mouth daily.   nortriptyline 25 MG capsule Commonly known as: Pamelor Take 1 capsule (25 mg total) by mouth at bedtime.   ondansetron 8 MG disintegrating tablet Commonly known as: ZOFRAN-ODT DISSOLVE 1 TABLET ON THE  TONGUE EVERY 8 HOURS AS  NEEDED FOR NAUSEA   oxyCODONE 5 MG immediate release tablet Commonly known as: Oxy IR/ROXICODONE Take 1 tablet (5 mg total) by mouth every 8 (eight) hours as needed for severe pain.   potassium chloride SA 20 MEQ tablet Commonly known as: KLOR-CON TAKE 1 TABLET BY MOUTH  DAILY   pravastatin 20 MG tablet Commonly known as: PRAVACHOL Take 1 tablet (20 mg total) by mouth daily.   tiZANidine 4 MG tablet Commonly known as: Zanaflex Take 1 tablet (4 mg total) by mouth every 6 (six) hours as needed for muscle spasms.   VITAMIN C PO Take 1 tablet by mouth daily.   Vitamin D2 50 MCG (2000 UT) Tabs tAKE 1 Tablet daily   Xifaxan 550 MG Tabs tablet Generic drug: rifaximin Take 550 mg by mouth 3 (three) times daily.        Allergies:  Allergies  Allergen Reactions   Bactrim [Sulfamethoxazole-Trimethoprim]    Codeine Hives   Lactose Diarrhea   Nitrofuran Derivatives Itching   Sulfa Antibiotics Hives   Penicillins Rash    Has patient had a PCN reaction causing immediate rash, facial/tongue/throat swelling, SOB or lightheadedness with hypotension: No Has patient had a PCN reaction causing severe rash involving mucus membranes or skin necrosis: No Has patient had a PCN reaction that required hospitalization: No Has patient had a PCN reaction occurring within the last 10 years: No If all of the above answers are "NO", then may proceed with Cephalosporin use.  Childhood rash - no adult reactions known    Family History: Family History  Problem Relation Age of Onset   Heart disease Mother        enlarged   Hypertension Mother    Stroke Mother    Diabetes Father    Diabetes Sister    Diabetes Maternal Grandmother    Colon cancer Neg  Hx     Social History:  reports that she quit smoking about 4 years ago. Her smoking use included cigarettes. She has a 12.50 pack-year smoking history. She has never used smokeless tobacco. She reports that she does not drink alcohol and does not use drugs.  ROS: All other review of systems were reviewed and are negative except what is noted above in HPI  Physical Exam: BP 134/83 (BP Location: Left Arm, Patient Position: Sitting, Cuff Size: Normal)   Pulse 71  Temp 98.2 F (36.8 C)   Ht 5' 5.5" (1.664 m)   Wt 191 lb (86.6 kg)   BMI 31.30 kg/m   Constitutional:  Alert and oriented, No acute distress. HEENT: [place] AT, moist mucus membranes.  Trachea midline, no masses. Cardiovascular: No clubbing, cyanosis, or edema. Respiratory: Normal respiratory effort, no increased work of breathing. GI: Abdomen is soft, nontender, nondistended, no abdominal masses GU: No CVA tenderness.  Lymph: No cervical or inguinal lymphadenopathy. Skin: No rashes, bruises or suspicious lesions. Neurologic: Grossly intact, no focal deficits, moving all 4 extremities. Psychiatric: Normal mood and affect.  Laboratory Data: Lab Results  Component Value Date   WBC 7.4 11/30/2020   HGB 12.4 11/30/2020   HCT 38.8 11/30/2020   MCV 78.2 (L) 11/30/2020   PLT 339 11/30/2020    Lab Results  Component Value Date   CREATININE 1.25 (H) 11/30/2020    No results found for: PSA  No results found for: TESTOSTERONE  Lab Results  Component Value Date   HGBA1C 6.0 (H) 11/30/2020    Urinalysis    Component Value Date/Time   COLORURINE YELLOW 10/04/2019 1523   APPEARANCEUR Hazy (A) 03/30/2020 1023   LABSPEC 1.014 10/04/2019 1523   PHURINE 7.0 10/04/2019 1523   GLUCOSEU Negative 03/30/2020 1023   HGBUR 2+ (A) 10/04/2019 1523   HGBUR small 05/22/2007 1334   BILIRUBINUR Negative 03/30/2020 1023   KETONESUR NEGATIVE 10/04/2019 1523   PROTEINUR Negative 03/30/2020 1023   PROTEINUR 1+ (A) 10/04/2019 1523    UROBILINOGEN 0.2 02/09/2014 1435   NITRITE Negative 03/30/2020 1023   NITRITE POSITIVE (A) 10/04/2019 1523   LEUKOCYTESUR 2+ (A) 03/30/2020 1023   LEUKOCYTESUR 3+ (A) 10/04/2019 1523    Lab Results  Component Value Date   LABMICR See below: 03/30/2020   WBCUA >30 (A) 03/30/2020   LABEPIT 0-10 03/30/2020   BACTERIA Many (A) 03/30/2020    Pertinent Imaging:  Results for orders placed during the hospital encounter of 12/10/16  DG Abd 1 View  Narrative CLINICAL DATA:  Diarrhea, rectal pain, and gas intermittently for the past 2 years. History of surgical repair of bowel obstruction, urinary tract malignancy treated with radiation therapy and chemotherapy.  EXAM: ABDOMEN - 1 VIEW  COMPARISON:  Abdominopelvic CT scan of Nov 10, 2015  FINDINGS: The colonic stool burden is moderate. There is gas and stool in the rectum. There is no small or large bowel obstructive pattern. The bony structures exhibit no acute abnormalities.  IMPRESSION: No acute intra-abdominal abnormality is observed. Given the patient's history of thickening of the rectal wall, abdominal and pelvic CT scanning would be a useful next imaging step.   Electronically Signed By: David  Martinique M.D. On: 12/10/2016 17:05  No results found for this or any previous visit.  No results found for this or any previous visit.  No results found for this or any previous visit.  No results found for this or any previous visit.  No results found for this or any previous visit.  No results found for this or any previous visit.  No results found for this or any previous visit.   Assessment & Plan:    1. Malignant neoplasm of urinary bladder, unspecified site [facility]) -Referral to Wound Ostomy for leaking pouch/pouch issues -RTC 1 year with renal US - Urinalysis, Routine w reflex microscopic   No follow-ups on file.  Nicolette Bang, MD  [facility] [place]

## 2021-03-29 ENCOUNTER — Ambulatory Visit: Payer: Medicare Other | Admitting: Urology

## 2021-04-03 ENCOUNTER — Encounter: Payer: Self-pay | Admitting: Urology

## 2021-04-03 NOTE — Patient Instructions (Signed)
Bladder Cancer Bladder cancer is a condition in which abnormal tissue (a tumor) grows in the bladder. The bladder is the organ that holds urine. Two tubes (ureters) carry the urine from the kidneys to the bladder. The bladder wall is made of layers of tissue. Cancer that spreads through these layers of the bladder wall becomes more difficult to treat. What are the causes? The cause of this condition is not known. What increases the risk? The following factors may make you more likely to develop this condition: Smoking. Working where there are risks (occupational exposures), such as working with rubber, leather, clothing fabric, dyes, chemicals, and paint. Being 70 years of age or older. Being female. Having bladder inflammation that is long-term (chronic). Having a history of cancer, including: A family history of bladder cancer. Personal experience with bladder cancer. Having had certain treatments for cancer before. These include: Medicines to kill cancer cells (chemotherapy). Strong X-ray beams or capsules high in energy to kill cancer cells and shrink tumors (radiation therapy). Having been exposed to arsenic. This is a chemical element that can poison you. What are the signs or symptoms? Early symptoms of this condition include: Seeing blood in your urine. Feeling pain when urinating. Having infections of your urinary system (urinary tract infections or UTIs) that happen often. Having to urinate sooner or more often than usual. Later symptoms of this condition include: Not being able to urinate. Pain on one side of your lower back. Loss of appetite. Weight loss. Tiredness (fatigue). Swelling in your feet. Bone pain. How is this diagnosed? This condition is diagnosed based on: Your medical history. A physical exam. Lab tests, such as urine tests. Imaging tests. Your symptoms. You may also have other tests or procedures done, such as: A cystoscopy. A narrow tube is inserted  into your bladder through the organ that connects your bladder to the outside of your body (urethra). This is done to view the lining of your bladder for tumors. A biopsy. This procedure involves removing a tissue sample to look at it under a microscope to see if cancer is present. It is important to find out: How deeply into the bladder wall cancer has grown. Whether cancer has spread to any other parts of your body. This may require blood tests or imaging tests, such as a CT scan, MRI, bone scan, or X-rays. How is this treated? Your health care provider may recommend one or more types of treatment based on the stage of your cancer. The most common types of treatment are: Surgery to remove the cancer. Procedures that may be done include: Removing a tumor on the inside wall of the bladder (transurethral resection). Removing the bladder (cystectomy). Radiation therapy. This is often used together with chemotherapy. Chemotherapy. Immunotherapy. This uses medicines to help your immune system destroy cancer cells. Follow these instructions at home: Take over-the-counter and prescription medicines only as told by your health care provider. Eat a healthy diet. Some of your treatments might affect your appetite. Do not use any products that contain nicotine or tobacco, such as cigarettes, e-cigarettes, and chewing tobacco. If you need help quitting, ask your health care provider. Consider joining a support group. This may help you learn to cope with the stress of having bladder cancer. Tell your cancer care team if you develop side effects. Your team may be able to recommend ways to get relief. Keep all follow-up visits as told by your health care provider. This is important. Where to find more information American   Cancer Society: www.cancer.org National Cancer Institute (NCI): www.cancer.gov Contact a health care provider if: You have symptoms of a urinary tract infection. These  include: Fever. Chills. Weakness. Muscle aches. Pain in your abdomen. Urge to urinate that is stronger and happens more often than usual. Burning feeling in the bladder or urethra when you urinate. Get help right away if: There is blood in your urine. You cannot urinate. You have severe pain or other symptoms that do not go away. Summary Bladder cancer is a condition in which tumors grow in the bladder and cause illness. This condition is diagnosed based on your medical history, a physical exam, lab tests, imaging tests, and your symptoms. Your health care provider may recommend one or more types of treatment based on the stage of your cancer. Consider joining a support group. This may help you learn to cope with the stress of having bladder cancer. This information is not intended to replace advice given to you by your health care provider. Make sure you discuss any questions you have with your health care provider. Document Revised: 70/26/2020 Document Reviewed: 70/26/2020 Elsevier Patient Education  2022 Elsevier Inc.  

## 2021-04-09 ENCOUNTER — Other Ambulatory Visit: Payer: Self-pay

## 2021-04-09 ENCOUNTER — Encounter: Payer: Self-pay | Admitting: Family Medicine

## 2021-04-09 ENCOUNTER — Ambulatory Visit (INDEPENDENT_AMBULATORY_CARE_PROVIDER_SITE_OTHER): Payer: Medicare Other | Admitting: Family Medicine

## 2021-04-09 VITALS — BP 130/78 | HR 84 | Temp 98.0°F | Resp 16 | Ht 65.5 in | Wt 200.0 lb

## 2021-04-09 DIAGNOSIS — M79601 Pain in right arm: Secondary | ICD-10-CM | POA: Diagnosis not present

## 2021-04-09 MED ORDER — PREDNISONE 20 MG PO TABS: ORAL_TABLET | ORAL | 0 refills | Status: DC

## 2021-04-09 NOTE — Progress Notes (Signed)
Subjective:    Patient ID: [name], female    DOB: May 10, 1951, 70 y.o.   MRN: [id number]  HPI  03/15/21 Patient was recently seen in an outside emergency room for pain around the right elbow.  She is tender to palpation over the right lateral epicondyle.  She is also tender to palpation over the tendinous insertion of the biceps on her volar right forearm.  There are some tenderness to palpation in the right antecubital fossa along the tendon.  It hurts for her to bend her elbow.  It hurts with palpation there and on the lateral epicondyle.  There is no redness.  There is no swelling.  There is no warmth.  At that time my plan was:  Patient has lateral epicondylitis.  I offer the patient a cortisone injection for tennis elbow but she declines.  Instead she will try prednisone taper pack given to her in the emergency room.  We discussed an elbow strap and the appropriate way to wear this.  Also believe that the prednisone may help with the biceps tendinitis that she is also having.  I see no evidence of infection.  There is no significant swelling in the right arm so I do not feel that this has anything to do with a DVT which was the initial concern at a local urgent care  04/09/21  Patient's pain has changed.  She now complains of pain aching and throbbing down her arm.  The pain starts over her lateral right shoulder and radiates down to her right antecubital fossa.  She has no pain with abduction greater than 90 degrees.  She has a negative empty can sign.  She has a negative Hawkins sign.  She does have pain with Spurling's maneuver.  She has no tenderness to palpation over the medial epicondyle now.  However she is tender over the biceps tendon in the antecubital fossa.  She is also tender to palpation over the biceps head on the anterior portion of the shoulder.  There is no palpable abnormalities.  There is no swelling.  She does have pain with palpation along the biceps.  She denies  any numbness or tingling in her hands Past Medical History:  Diagnosis Date   Acid reflux    Bowel obstruction (HCC) several   Recurrent SBO secondary to adhesions   Cancer [facility]) 2011 [facility])   diagnosed in 2009 per pt. Invasive High grade Urothelial carcinoma- s/p radiation and Chemo    Chronic back pain    DVT (deep venous thrombosis) ([place]) 2015   Fatty liver    Gout    Hyperlipidemia    Hypertension    pt says taken off medication since has lost weight.   Hypothyroidism    Internal hemorrhoids    Leukopenia 12/06/2011   HIV serology negative   Malnutrition ([place])    PE (pulmonary embolism)    [facility]   Rectal ulcer 2016   [facility]   UTI (lower urinary tract infection)    Recurrent   Past Surgical History:  Procedure Laterality Date   ABDOMINAL SURGERY     with intestinal "puncture" x 2, exploratory surgery    BACK SURGERY     X2   BLADDER REMOVAL     CHOLECYSTECTOMY     CYSTECTOMY     breast   HERNIA REPAIR     mesh   ILEO CONDUIT     For bladder cancer   PORTACATH PLACEMENT  Current Outpatient Medications on File Prior to Visit  Medication Sig Dispense Refill   Ascorbic Acid (VITAMIN C PO) Take 1 tablet by mouth daily.     colestipol (COLESTID) 1 g tablet Take by mouth.     Cyanocobalamin 1000 MCG CAPS Take 1 tablet by mouth daily. 90 capsule 3   dexlansoprazole (DEXILANT) 60 MG capsule Take 1 capsule (60 mg total) by mouth daily. 90 capsule 3   diclofenac Sodium (VOLTAREN) 1 % GEL APPLY TO AFFECTED AREAS 3 TIMES DAILY AS NEEDED. 100 g 0   diphenoxylate-atropine (LOMOTIL) 2.5-0.025 MG tablet Take 1 tablet by mouth 4 (four) times daily as needed for diarrhea or loose stools. 120 tablet 2   ELIQUIS 5 MG TABS tablet TAKE 1 TABLET BY MOUTH  TWICE DAILY 180 tablet 3   Ergocalciferol (VITAMIN D2) 50 MCG (2000 UT) TABS tAKE 1 Tablet daily 30 tablet    furosemide (LASIX) 40 MG tablet Take 1 tablet (40 mg total) by mouth 2 (two) times daily as  needed for edema. 180 tablet 3   hydrocortisone 2.5 % cream APPLY RECTALLY TWICE DAILY AS NEEDED- please dispense with rectal tip 28.35 g 11   levothyroxine (SYNTHROID) 150 MCG tablet TAKE 1 TABLET BY MOUTH  DAILY BEFORE BREAKFAST 90 tablet 3   lidocaine (XYLOCAINE) 5 % ointment APPLY TOPICALLY 4 TIMES  DAILY AS NEEDED 318.96 g 1   magnesium oxide (MAG-OX) 400 MG tablet Take by mouth.     Magnesium Oxide 400 (240 Mg) MG TABS Take 1.5 tablet BID 90 tablet 2   morphine (MS CONTIN) 15 MG 12 hr tablet Take 1 tablet (15 mg total) by mouth every 12 (twelve) hours. 60 tablet 0   Multiple Vitamin (MULTI-VITAMIN) tablet Take 2 tablets by mouth daily.     Multiple Vitamins-Minerals (MULTIVITAMIN ADULT, MINERALS,) TABS Take 1 tablet by mouth daily.     Multiple Vitamins-Minerals (MULTIVITAMIN PO) Take 1 tablet by mouth daily.     nortriptyline (PAMELOR) 25 MG capsule Take 1 capsule (25 mg total) by mouth at bedtime. 30 capsule 3   ondansetron (ZOFRAN-ODT) 8 MG disintegrating tablet DISSOLVE 1 TABLET ON THE  TONGUE EVERY 8 HOURS AS  NEEDED FOR NAUSEA 90 tablet 3   oxyCODONE (OXY IR/ROXICODONE) 5 MG immediate release tablet Take 1 tablet (5 mg total) by mouth every 8 (eight) hours as needed for severe pain. 90 tablet 0   potassium chloride SA (KLOR-CON) 20 MEQ tablet TAKE 1 TABLET BY MOUTH  DAILY 90 tablet 3   pravastatin (PRAVACHOL) 20 MG tablet Take 1 tablet (20 mg total) by mouth daily. 90 tablet 3   tiZANidine (ZANAFLEX) 4 MG tablet Take 1 tablet (4 mg total) by mouth every 6 (six) hours as needed for muscle spasms. 30 tablet 0   XIFAXAN 550 MG TABS tablet Take 550 mg by mouth 3 (three) times daily.     No current facility-administered medications on file prior to visit.   Allergies  Allergen Reactions   Bactrim [Sulfamethoxazole-Trimethoprim]    Codeine Hives   Lactose Diarrhea   Nitrofuran Derivatives Itching   Sulfa Antibiotics Hives   Penicillins Rash    Has patient had a PCN reaction causing  immediate rash, facial/tongue/throat swelling, SOB or lightheadedness with hypotension: No Has patient had a PCN reaction causing severe rash involving mucus membranes or skin necrosis: No Has patient had a PCN reaction that required hospitalization: No Has patient had a PCN reaction occurring within the last 10 years: No  If all of the above answers are "NO", then may proceed with Cephalosporin use.  Childhood rash - no adult reactions known   Social History   Socioeconomic History   Marital status: Widowed    Spouse name: Not on file   Number of children: 2   Years of education: Not on file   Highest education level: Not on file  Occupational History    Employer: UNEMPLOYED  Tobacco Use   Smoking status: Former    Packs/day: 0.50    Years: 25.00    Pack years: 12.50    Types: Cigarettes    Quit date: 10/03/2016    Years since quitting: 4.5   Smokeless tobacco: Never  Vaping Use   Vaping Use: Never used  Substance and Sexual Activity   Alcohol use: No   Drug use: No   Sexual activity: Yes    Birth control/protection: Post-menopausal  Other Topics Concern   Not on file  Social History Narrative   Not on file   Social Determinants of Health   Financial Resource Strain: Not on file  Food Insecurity: Not on file  Transportation Needs: Not on file  Physical Activity: Not on file  Stress: Not on file  Social Connections: Not on file  Intimate Partner Violence: Not on file       Review of Systems  All other systems reviewed and are negative.     Objective:   Physical Exam Vitals reviewed.  Eyes:     General: No scleral icterus.       Right eye: No discharge.        Left eye: No discharge.     Conjunctiva/sclera: Conjunctivae normal.     Pupils: Pupils are equal, round, and reactive to light.  Cardiovascular:     Rate and Rhythm: Normal rate and regular rhythm.     Heart sounds: Normal heart sounds. No murmur heard. Pulmonary:     Effort: Pulmonary effort  is normal. No respiratory distress.     Breath sounds: Normal breath sounds. No wheezing or rales.  Chest:     Chest wall: No tenderness.  Abdominal:     General: Bowel sounds are normal. There is no distension.     Palpations: Abdomen is soft. There is no mass.     Tenderness: There is no abdominal tenderness. There is no guarding or rebound.  Musculoskeletal:     Left elbow: No swelling or deformity. Normal range of motion. Tenderness present in lateral epicondyle.       Arms:  Skin:    General: Skin is warm.     Findings: No erythema or rash.  Neurological:     Mental Status: She is alert and oriented to person, place, and time.     Cranial Nerves: No cranial nerve deficit.     Motor: No abnormal muscle tone.     Coordination: Coordination normal.     Deep Tendon Reflexes: Reflexes are normal and symmetric.        Assessment & Plan:  Right arm pain I am not certain of the diagnosis.  On the differential diagnosis includes cervical radiculopathy.  The radiating nature of the pain down the right arm from the shoulder through the elbow certainly suggest that.  Also high on the differential diagnosis would be biceps tendinitis.  However the pain seems to be both at the origin near the shoulder as well as at the insertion in the antecubital fossa which seems atypical.  I see  no evidence of a DVT is there is no swelling in the arm.  There is no erythema or warmth.  Therefore I favor cervical radiculopathy.  However I am not confident enough in the diagnosis to suggest an MRI.  I would like to get a second opinion with orthopedics to better tailor the diagnostic work-up.  Therefore I will consult orthopedics and treat the patient again for possible cervical radiculopathy with a prednisone taper pack

## 2021-04-17 ENCOUNTER — Other Ambulatory Visit: Payer: Self-pay

## 2021-04-17 ENCOUNTER — Ambulatory Visit (HOSPITAL_COMMUNITY)
Admission: RE | Admit: 2021-04-17 | Discharge: 2021-04-17 | Disposition: A | Discharge status: Home / Self Care | Payer: Medicare Other | Source: Ambulatory Visit | Attending: Family Medicine | Admitting: Family Medicine

## 2021-04-17 DIAGNOSIS — N99528 Other complication of other external stoma of urinary tract: Secondary | ICD-10-CM | POA: Diagnosis not present

## 2021-04-17 DIAGNOSIS — Z8551 Personal history of malignant neoplasm of bladder: Secondary | ICD-10-CM | POA: Diagnosis not present

## 2021-04-18 ENCOUNTER — Other Ambulatory Visit: Payer: Self-pay | Admitting: *Deleted

## 2021-04-18 DIAGNOSIS — Z936 Other artificial openings of urinary tract status: Secondary | ICD-10-CM | POA: Diagnosis not present

## 2021-04-18 NOTE — Telephone Encounter (Signed)
Received call from patient.   Requested refill on MS Contin and Oxycodone.   Ok to refill??  Last office visit 04/09/2021.  Last refill 03/15/2021.

## 2021-04-19 MED ORDER — MORPHINE SULFATE ER 15 MG PO TBCR: 15.0000 mg | EXTENDED_RELEASE_TABLET | Freq: Two times a day (BID) | ORAL | 0 refills | Status: DC

## 2021-04-19 MED ORDER — OXYCODONE HCL 5 MG PO TABS: 5.0000 mg | ORAL_TABLET | Freq: Three times a day (TID) | ORAL | 0 refills | Status: DC | PRN

## 2021-04-19 NOTE — Progress Notes (Signed)
[facility]   Reason for visit:  RLQ urostomy HPI:  Hx bladder cancer with ileal conduit ROS  Review of Systems  Gastrointestinal:        RLQ ileal conduit  All other systems reviewed and are negative. Vital signs:  BP (!) 139/91   Pulse 93   Temp 97.7 F (36.5 C) (Oral)   Resp 18   SpO2 96%  Exam:  Physical Exam Abdominal:     Palpations: Abdomen is soft.      Stoma type/location:  RLQ ileal conduit Stomal assessment/size:  1" pink and moist Peristomal assessment:  intact Treatment options for stomal/peristomal skin: adding barrier ring for improved wear time and ostomy belt.  Output: clear yellow urine Ostomy pouching: 1pc.convex with barrier ring Education provided:  We applied ostomy belt and I demonstrated barrier ring    Impression/dx  Ileal conduit Discussion  See back in 2 weeks,  Plan  2 weeks follow up.     Visit time: 50 minutes.   Domenic Moras FNP-BC

## 2021-04-23 NOTE — Discharge Instructions (Signed)
Call clinic as needed

## 2021-05-09 ENCOUNTER — Ambulatory Visit: Payer: Medicare Other | Admitting: Orthopaedic Surgery

## 2021-05-09 ENCOUNTER — Encounter: Payer: Self-pay | Admitting: Orthopaedic Surgery

## 2021-05-09 ENCOUNTER — Ambulatory Visit (INDEPENDENT_AMBULATORY_CARE_PROVIDER_SITE_OTHER): Payer: Medicare Other | Admitting: Orthopaedic Surgery

## 2021-05-09 VITALS — Ht 65.0 in | Wt 194.1 lb

## 2021-05-09 DIAGNOSIS — M25521 Pain in right elbow: Secondary | ICD-10-CM

## 2021-05-09 DIAGNOSIS — M7521 Bicipital tendinitis, right shoulder: Secondary | ICD-10-CM | POA: Diagnosis not present

## 2021-05-09 NOTE — Progress Notes (Signed)
Subjective:    Patient ID: [name], female    DOB: [birth year], 70 y.o.   MRN: [id number]  HPI She has had right elbow and upper arm pain since around the first of October.  She has been to the ER and given prednisone dose pack which helped.  She has been at [place] on 04-09-21.  I have reviewed the notes.  She got a little better and now worse.  She has pain at the insertion of the biceps to the elbow area.  She has no redness, no trauma, no swelling, no numbness.  She has used Lidocaine 5% ointment to the area which helps.  She is tired of hurting.  She denies any heavy lifting.  She has only very slight shoulder pain.     Review of Systems  Constitutional:  Positive for activity change.  Musculoskeletal:  Positive for arthralgias and back pain.  All other systems reviewed and are negative. For Review of Systems, all other systems reviewed and are negative.  The following is a summary of the past history medically, past history surgically, known current medicines, social history and family history.  This information is gathered electronically by the computer from prior information and documentation.  I review this each visit and have found including this information at this point in the chart is beneficial and informative.   Past Medical History:  Diagnosis Date   Acid reflux    Bowel obstruction ([place]) several   Recurrent SBO secondary to adhesions   Cancer [facility]) 2011 [facility])   diagnosed in 2009 per pt. Invasive High grade Urothelial carcinoma- s/p radiation and Chemo    Chronic back pain    DVT (deep venous thrombosis) ([place]) 2015   Fatty liver    Gout    Hyperlipidemia    Hypertension    pt says taken off medication since has lost weight.   Hypothyroidism    Internal hemorrhoids    Leukopenia 12/06/2011   HIV serology negative   Malnutrition ([place])    PE (pulmonary embolism)    [facility]   Rectal ulcer 2016   [facility]    UTI (lower urinary tract infection)    Recurrent    Past Surgical History:  Procedure Laterality Date   ABDOMINAL SURGERY     with intestinal "puncture" x 2, exploratory surgery    BACK SURGERY     X2   BLADDER REMOVAL     CHOLECYSTECTOMY     CYSTECTOMY     breast   HERNIA REPAIR     mesh   ILEO CONDUIT     For bladder cancer   PORTACATH PLACEMENT      Current Outpatient Medications on File Prior to Visit  Medication Sig Dispense Refill   Ascorbic Acid (VITAMIN C PO) Take 1 tablet by mouth daily.     colestipol (COLESTID) 1 g tablet Take by mouth.     Cyanocobalamin 1000 MCG CAPS Take 1 tablet by mouth daily. 90 capsule 3   dexlansoprazole (DEXILANT) 60 MG capsule Take 1 capsule (60 mg total) by mouth daily. 90 capsule 3   diclofenac Sodium (VOLTAREN) 1 % GEL APPLY TO AFFECTED AREAS 3 TIMES DAILY AS NEEDED. 100 g 0   diphenoxylate-atropine (LOMOTIL) 2.5-0.025 MG tablet Take 1 tablet by mouth 4 (four) times daily as needed for diarrhea or loose stools. 120 tablet 2   ELIQUIS 5 MG TABS tablet TAKE 1 TABLET BY MOUTH  TWICE  DAILY 180 tablet 3   Ergocalciferol (VITAMIN D2) 50 MCG (2000 UT) TABS tAKE 1 Tablet daily 30 tablet    furosemide (LASIX) 40 MG tablet Take 1 tablet (40 mg total) by mouth 2 (two) times daily as needed for edema. 180 tablet 3   hydrocortisone 2.5 % cream APPLY RECTALLY TWICE DAILY AS NEEDED- please dispense with rectal tip 28.35 g 11   levothyroxine (SYNTHROID) 150 MCG tablet TAKE 1 TABLET BY MOUTH  DAILY BEFORE BREAKFAST 90 tablet 3   lidocaine (XYLOCAINE) 5 % ointment APPLY TOPICALLY 4 TIMES  DAILY AS NEEDED 318.96 g 1   magnesium oxide (MAG-OX) 400 MG tablet Take by mouth.     Magnesium Oxide 400 (240 Mg) MG TABS Take 1.5 tablet BID 90 tablet 2   morphine (MS CONTIN) 15 MG 12 hr tablet Take 1 tablet (15 mg total) by mouth every 12 (twelve) hours. 60 tablet 0   Multiple Vitamin (MULTI-VITAMIN) tablet Take 2 tablets by mouth daily.     Multiple  Vitamins-Minerals (MULTIVITAMIN ADULT, MINERALS,) TABS Take 1 tablet by mouth daily.     Multiple Vitamins-Minerals (MULTIVITAMIN PO) Take 1 tablet by mouth daily.     nortriptyline (PAMELOR) 25 MG capsule Take 1 capsule (25 mg total) by mouth at bedtime. 30 capsule 3   ondansetron (ZOFRAN-ODT) 8 MG disintegrating tablet DISSOLVE 1 TABLET ON THE  TONGUE EVERY 8 HOURS AS  NEEDED FOR NAUSEA 90 tablet 3   oxyCODONE (OXY IR/ROXICODONE) 5 MG immediate release tablet Take 1 tablet (5 mg total) by mouth every 8 (eight) hours as needed for severe pain. 90 tablet 0   potassium chloride SA (KLOR-CON) 20 MEQ tablet TAKE 1 TABLET BY MOUTH  DAILY 90 tablet 3   pravastatin (PRAVACHOL) 20 MG tablet Take 1 tablet (20 mg total) by mouth daily. 90 tablet 3   tiZANidine (ZANAFLEX) 4 MG tablet Take 1 tablet (4 mg total) by mouth every 6 (six) hours as needed for muscle spasms. 30 tablet 0   XIFAXAN 550 MG TABS tablet Take 550 mg by mouth 3 (three) times daily.     predniSONE (DELTASONE) 20 MG tablet 3 tabs poqday 1-2, 2 tabs poqday 3-4, 1 tab poqday 5-6 (Patient not taking: Reported on 05/09/2021) 12 tablet 0   No current facility-administered medications on file prior to visit.    Social History   Socioeconomic History   Marital status: Widowed    Spouse name: Not on file   Number of children: 2   Years of education: Not on file   Highest education level: Not on file  Occupational History    Employer: UNEMPLOYED  Tobacco Use   Smoking status: Former    Packs/day: 0.50    Years: 25.00    Pack years: 12.50    Types: Cigarettes    Quit date: 10/03/2016    Years since quitting: 4.6   Smokeless tobacco: Never  Vaping Use   Vaping Use: Never used  Substance and Sexual Activity   Alcohol use: No   Drug use: No   Sexual activity: Yes    Birth control/protection: Post-menopausal  Other Topics Concern   Not on file  Social History Narrative   Not on file   Social Determinants of Health   Financial  Resource Strain: Not on file  Food Insecurity: Not on file  Transportation Needs: Not on file  Physical Activity: Not on file  Stress: Not on file  Social Connections: Not on file  Intimate Partner  Violence: Not on file    Family History  Problem Relation Age of Onset   Heart disease Mother        enlarged   Hypertension Mother    Stroke Mother    Diabetes Father    Diabetes Sister    Diabetes Maternal Grandmother    Colon cancer Neg Hx     Ht 5\' 5"  (1.651 m)   Wt 194 lb 2 oz (88.1 kg)   BMI 32.30 kg/m   Body mass index is 32.3 kg/m.     Objective:   Physical Exam Vitals and nursing note reviewed. Exam conducted with a chaperone present.  Constitutional:      Appearance: She is well-developed.  HENT:     Head: Normocephalic and atraumatic.  Eyes:     Conjunctiva/sclera: Conjunctivae normal.     Pupils: Pupils are equal, round, and reactive to light.  Cardiovascular:     Rate and Rhythm: Normal rate and regular rhythm.  Pulmonary:     Effort: Pulmonary effort is normal.  Abdominal:     Palpations: Abdomen is soft.  Musculoskeletal:       Arms:     Cervical back: Normal range of motion and neck supple.  Skin:    General: Skin is warm and dry.  Neurological:     Mental Status: She is alert and oriented to person, place, and time.     Cranial Nerves: No cranial nerve deficit.     Motor: No abnormal muscle tone.     Coordination: Coordination normal.     Deep Tendon Reflexes: Reflexes are normal and symmetric. Reflexes normal.  Psychiatric:        Behavior: Behavior normal.        Thought Content: Thought content normal.        Judgment: Judgment normal.  X-rays of the right elbow were done and are negative.        Assessment & Plan:   Encounter Diagnoses  Name Primary?   Pain in right elbow Yes   Biceps tendinitis, right    I am concerned about biceps tendinitis at the elbow area.  She have a small partial tear.  She may need MRI.  I will have  her continue the Lidocaine gel, use heat.  Begin Aleve bid.  Return in two weeks.  Call if any problem.  Avoid lifting objects with the right hand.  Precautions discussed.  Electronically Signed Sanjuana Kava, MD 11/9/202210:49 AM

## 2021-05-22 ENCOUNTER — Other Ambulatory Visit: Payer: Self-pay

## 2021-05-22 MED ORDER — OXYCODONE HCL 5 MG PO TABS: 5.0000 mg | ORAL_TABLET | Freq: Three times a day (TID) | ORAL | 0 refills | Status: DC | PRN

## 2021-05-22 MED ORDER — MORPHINE SULFATE ER 15 MG PO TBCR
15.0000 mg | EXTENDED_RELEASE_TABLET | Freq: Two times a day (BID) | ORAL | 0 refills | Status: DC
Start: 2021-05-22 — End: 2021-06-20

## 2021-05-22 NOTE — Telephone Encounter (Signed)
LOV 04/09/21 Last refill  Oxycodone: 04/19/21, #90, 0 refills Morphine: 04/19/21, #60, 0 refills  Please review, thanks!

## 2021-05-22 NOTE — Telephone Encounter (Signed)
PDMP reviewed.  Patient appears to take this medication chronically.  She has been on these medications at this dose/frequency for >6 months.  Refill given in place of PCP who is out of the office.

## 2021-05-23 ENCOUNTER — Ambulatory Visit (INDEPENDENT_AMBULATORY_CARE_PROVIDER_SITE_OTHER): Payer: Medicare Other | Admitting: Orthopaedic Surgery

## 2021-05-23 ENCOUNTER — Encounter: Payer: Self-pay | Admitting: Orthopaedic Surgery

## 2021-05-23 DIAGNOSIS — F17210 Nicotine dependence, cigarettes, uncomplicated: Secondary | ICD-10-CM

## 2021-05-23 DIAGNOSIS — M7521 Bicipital tendinitis, right shoulder: Secondary | ICD-10-CM

## 2021-05-23 DIAGNOSIS — M25521 Pain in right elbow: Secondary | ICD-10-CM

## 2021-05-23 DIAGNOSIS — Z936 Other artificial openings of urinary tract status: Secondary | ICD-10-CM | POA: Diagnosis not present

## 2021-05-23 NOTE — Progress Notes (Signed)
I have less pain.  Her right elbow is less tender and she has good motion.  She has no swelling.  She has no new trauma.  She has no numbness.  Right elbow is not that tender at the biceps insertion.  She has full ROM, no swelling, no redness.  NV intact.  Grips are normal.  Encounter Diagnoses  Name Primary?   Pain in right elbow Yes   Biceps tendinitis, right    Nicotine dependence, cigarettes, uncomplicated    I will see as needed.  Continue as she is doing.  Call if any problem.  Precautions discussed.  Electronically Signed Sanjuana Kava, MD 11/23/202210:19 AM

## 2021-05-23 NOTE — Patient Instructions (Signed)

## 2021-06-20 ENCOUNTER — Other Ambulatory Visit: Payer: Self-pay

## 2021-06-20 NOTE — Telephone Encounter (Signed)
LOV 04/09/21 Last refill  Oxycodone: 05/22/21, #90, 0 refills Morphine: 05/22/21, #60, 0 refills  Please review, thanks!

## 2021-06-21 MED ORDER — MORPHINE SULFATE ER 15 MG PO TBCR: 15.0000 mg | EXTENDED_RELEASE_TABLET | Freq: Two times a day (BID) | ORAL | 0 refills | Status: DC

## 2021-06-21 MED ORDER — OXYCODONE HCL 5 MG PO TABS: 5.0000 mg | ORAL_TABLET | Freq: Three times a day (TID) | ORAL | 0 refills | Status: DC | PRN

## 2021-06-26 DIAGNOSIS — M791 Myalgia, unspecified site: Secondary | ICD-10-CM | POA: Diagnosis not present

## 2021-06-26 DIAGNOSIS — K5909 Other constipation: Secondary | ICD-10-CM | POA: Diagnosis not present

## 2021-06-26 DIAGNOSIS — Z20822 Contact with and (suspected) exposure to covid-19: Secondary | ICD-10-CM | POA: Diagnosis not present

## 2021-06-26 DIAGNOSIS — Z936 Other artificial openings of urinary tract status: Secondary | ICD-10-CM | POA: Diagnosis not present

## 2021-06-26 DIAGNOSIS — U071 COVID-19: Secondary | ICD-10-CM | POA: Diagnosis not present

## 2021-06-26 MED ORDER — POTASSIUM CHLORIDE CRYS ER 20 MEQ PO TBCR: 20.0000 meq | EXTENDED_RELEASE_TABLET | Freq: Every day | ORAL | 3 refills | Status: DC

## 2021-06-26 NOTE — Addendum Note (Signed)
Addended by: Jaynie Crumble on: 06/26/2021 01:58 PM   Modules accepted: Orders

## 2021-06-27 ENCOUNTER — Other Ambulatory Visit: Payer: Self-pay

## 2021-06-27 ENCOUNTER — Telehealth (INDEPENDENT_AMBULATORY_CARE_PROVIDER_SITE_OTHER): Payer: Medicare Other | Admitting: Nurse Practitioner

## 2021-06-27 DIAGNOSIS — Z91199 Patient's noncompliance with other medical treatment and regimen due to unspecified reason: Secondary | ICD-10-CM

## 2021-06-27 MED ORDER — POTASSIUM CHLORIDE CRYS ER 20 MEQ PO TBCR: 20.0000 meq | EXTENDED_RELEASE_TABLET | Freq: Every day | ORAL | 3 refills | Status: DC

## 2021-06-27 NOTE — Progress Notes (Signed)
Called and left message x 2 to get visit started.  Patient with no answer and no return call.

## 2021-06-30 DIAGNOSIS — Z20822 Contact with and (suspected) exposure to covid-19: Secondary | ICD-10-CM | POA: Diagnosis not present

## 2021-07-03 ENCOUNTER — Other Ambulatory Visit: Payer: Self-pay

## 2021-07-03 MED ORDER — APIXABAN 5 MG PO TABS: 5.0000 mg | ORAL_TABLET | Freq: Two times a day (BID) | ORAL | 3 refills | Status: DC

## 2021-07-10 ENCOUNTER — Other Ambulatory Visit: Payer: Self-pay

## 2021-07-10 MED ORDER — APIXABAN 5 MG PO TABS: 5.0000 mg | ORAL_TABLET | Freq: Two times a day (BID) | ORAL | 3 refills | Status: DC

## 2021-07-11 ENCOUNTER — Ambulatory Visit: Payer: Medicare Other | Admitting: Nutrition

## 2021-07-12 ENCOUNTER — Other Ambulatory Visit: Payer: Self-pay | Admitting: Family Medicine

## 2021-07-12 DIAGNOSIS — R7303 Prediabetes: Secondary | ICD-10-CM

## 2021-07-13 ENCOUNTER — Telehealth: Payer: Self-pay

## 2021-07-13 DIAGNOSIS — R079 Chest pain, unspecified: Secondary | ICD-10-CM | POA: Diagnosis not present

## 2021-07-13 DIAGNOSIS — K5900 Constipation, unspecified: Secondary | ICD-10-CM | POA: Diagnosis not present

## 2021-07-13 DIAGNOSIS — I878 Other specified disorders of veins: Secondary | ICD-10-CM | POA: Diagnosis not present

## 2021-07-13 DIAGNOSIS — R072 Precordial pain: Secondary | ICD-10-CM | POA: Diagnosis not present

## 2021-07-13 DIAGNOSIS — E785 Hyperlipidemia, unspecified: Secondary | ICD-10-CM | POA: Diagnosis not present

## 2021-07-13 DIAGNOSIS — I10 Essential (primary) hypertension: Secondary | ICD-10-CM | POA: Diagnosis not present

## 2021-07-13 DIAGNOSIS — R0789 Other chest pain: Secondary | ICD-10-CM | POA: Diagnosis not present

## 2021-07-13 DIAGNOSIS — R1013 Epigastric pain: Secondary | ICD-10-CM | POA: Diagnosis not present

## 2021-07-13 DIAGNOSIS — R109 Unspecified abdominal pain: Secondary | ICD-10-CM | POA: Diagnosis not present

## 2021-07-13 DIAGNOSIS — F17210 Nicotine dependence, cigarettes, uncomplicated: Secondary | ICD-10-CM | POA: Diagnosis not present

## 2021-07-13 NOTE — Telephone Encounter (Signed)
Patient called complaining of chest pain and shortness of breath.  Advised patient to seek medical attention at an urgent care or hospital.  Patient agreeable.

## 2021-07-16 ENCOUNTER — Other Ambulatory Visit: Payer: Self-pay

## 2021-07-16 ENCOUNTER — Encounter: Payer: Commercial Managed Care - HMO | Attending: Family Medicine | Admitting: Nutrition

## 2021-07-16 DIAGNOSIS — E669 Obesity, unspecified: Secondary | ICD-10-CM | POA: Insufficient documentation

## 2021-07-16 DIAGNOSIS — R7303 Prediabetes: Secondary | ICD-10-CM | POA: Insufficient documentation

## 2021-07-16 DIAGNOSIS — I10 Essential (primary) hypertension: Secondary | ICD-10-CM | POA: Insufficient documentation

## 2021-07-16 DIAGNOSIS — E782 Mixed hyperlipidemia: Secondary | ICD-10-CM | POA: Insufficient documentation

## 2021-07-16 DIAGNOSIS — K760 Fatty (change of) liver, not elsewhere classified: Secondary | ICD-10-CM | POA: Insufficient documentation

## 2021-07-16 NOTE — Patient Instructions (Addendum)
Increase fiber rich foods Drink 5 bottles of water per day Avoid bananas and applesauce due to constipation.

## 2021-07-16 NOTE — Progress Notes (Signed)
°  Medical Nutrition Therapy:  Appt start time: 1630 end time:  1700    Assessment:  Primary concerns today: Follow up for IBS and GI issues Had been in hospital for constipation. Had covid during Christmas. Hasn't felt well. Working on eating more fresh fruits, vegetables and whole grains. Has been eating more bananas and appleasauce recently. This may be contributing to some of her constipation. Discussed other fruit choices that would be better for constipation.   Prediabetic with A1C 6.%. PMH: Fatty liver, Hyperlipidemia, HTN, Obesity,   Wt Readings from Last 3 Encounters:  05/09/21 194 lb 2 oz (88.1 kg)  04/09/21 200 lb (90.7 kg)  03/28/21 191 lb (86.6 kg)   Ht Readings from Last 3 Encounters:  05/09/21 5\' 5"  (1.651 m)  04/09/21 5' 5.5" (1.664 m)  03/28/21 5' 5.5" (1.664 m)   There is no height or weight on file to calculate BMI. @BMIFA @ Facility age limit for growth percentiles is 20 years. Facility age limit for growth percentiles is 20 years.    Lab Results  Component Value Date   HGBA1C 6.0 (H) 11/30/2020   CMP Latest Ref Rng & Units 11/30/2020 08/15/2020 04/04/2020  Glucose 65 - 99 mg/dL 108(H) 133(H) 104(H)  BUN 7 - 25 mg/dL 18 17 18   Creatinine 0.50 - 0.99 mg/dL 1.25(H) 1.39(H) 1.33(H)  Sodium 135 - 146 mmol/L 138 143 138  Potassium 3.5 - 5.3 mmol/L 3.9 3.5 3.8  Chloride 98 - 110 mmol/L 103 107 102  CO2 20 - 32 mmol/L 25 24 26   Calcium 8.6 - 10.4 mg/dL 9.1 9.2 9.3  Total Protein 6.1 - 8.1 g/dL 7.2 7.0 7.2  Total Bilirubin 0.2 - 1.2 mg/dL 0.6 0.6 0.6  Alkaline Phos 38 - 126 U/L - - -  AST 10 - 35 U/L 13 13 14   ALT 6 - 29 U/L 13 11 10    Lipid Panel     Component Value Date/Time   CHOL 147 11/30/2020 0905   TRIG 240 (H) 11/30/2020 0905   HDL 42 (L) 11/30/2020 0905   CHOLHDL 3.5 11/30/2020 0905   VLDL 29 01/31/2016 1045   LDLCALC 72 11/30/2020 0905    Preferred Learning Style: No preference indicated   Learning Readiness:  Ready Change in  progress   MEDICATIONS:   DIETARY INTAKE: Eating some better balanced meals. Eats out often with daughter on weekends. Usual physical activity: ADL  Estimated energy needs: 1500  calories 170  g carbohydrates 112 g protein 42 g fat  Progress Towards Goal(s):  In progress.   Nutritional Diagnosis:  NI-5.11.1 Predicted suboptimal nutrient intake As related to bowel issues and malabsorption   As evidenced by Low potassium, Low magnesium levels and chronic diarrhea >15 times per day.     Intervention:  FODMAP diet. Focusing on eating more nutrient dense foods, avoiding high fat, high sugar and processed foods. Drinking only water. Rncouraged more plant based foods and avoiding processed high fat foods.   .Goals  Increase fiber rich foods Drink 5 bottles of water per day Avoid bananas and applesauce due to constipation.  Teaching Method Utilized:  Visual Auditory Hands on  Handouts given during visit include: Heart Healthy foods High TG diet  Barriers to learning/adherence to lifestyle change: none  Demonstrated degree of understanding via:  Teach Back   Monitoring/Evaluation:  Dietary intake, exercise, , and body weight in 4 month(s).

## 2021-07-17 ENCOUNTER — Encounter: Payer: Self-pay | Admitting: Nutrition

## 2021-07-24 ENCOUNTER — Other Ambulatory Visit: Payer: Self-pay

## 2021-07-24 MED ORDER — MORPHINE SULFATE ER 15 MG PO TBCR: 15.0000 mg | EXTENDED_RELEASE_TABLET | Freq: Two times a day (BID) | ORAL | 0 refills | Status: DC

## 2021-07-24 MED ORDER — OXYCODONE HCL 5 MG PO TABS: 5.0000 mg | ORAL_TABLET | Freq: Three times a day (TID) | ORAL | 0 refills | Status: DC | PRN

## 2021-07-24 NOTE — Telephone Encounter (Signed)
Pt called to request refills on Oxycodone and Morphine  LOV 04/09/21 Last refill: Oxycodone  06/21/21, #90, 0 refills Morphine  06/21/21, #60, 0 refills  Please review, thanks!

## 2021-07-27 ENCOUNTER — Ambulatory Visit: Payer: Medicare Other

## 2021-07-31 DIAGNOSIS — Z936 Other artificial openings of urinary tract status: Secondary | ICD-10-CM | POA: Diagnosis not present

## 2021-08-02 ENCOUNTER — Ambulatory Visit: Payer: Medicare Other

## 2021-08-03 ENCOUNTER — Other Ambulatory Visit: Payer: Self-pay

## 2021-08-03 ENCOUNTER — Ambulatory Visit (INDEPENDENT_AMBULATORY_CARE_PROVIDER_SITE_OTHER): Payer: Medicare Other

## 2021-08-03 VITALS — Ht 65.0 in | Wt 182.0 lb

## 2021-08-03 DIAGNOSIS — Z780 Asymptomatic menopausal state: Secondary | ICD-10-CM | POA: Diagnosis not present

## 2021-08-03 DIAGNOSIS — Z0000 Encounter for general adult medical examination without abnormal findings: Secondary | ICD-10-CM

## 2021-08-03 DIAGNOSIS — F17200 Nicotine dependence, unspecified, uncomplicated: Secondary | ICD-10-CM

## 2021-08-03 DIAGNOSIS — Z1231 Encounter for screening mammogram for malignant neoplasm of breast: Secondary | ICD-10-CM | POA: Diagnosis not present

## 2021-08-03 NOTE — Progress Notes (Signed)
Subjective:   [name] is a 71 y.o. female who presents for Medicare Annual (Subsequent) preventive examination. Virtual Visit via Telephone Note  I connected with  [name] on 08/03/21 at 10:30 AM EST by telephone and verified that I am speaking with the correct person using two identifiers.  Location: Patient: HOME Provider: [facility] Persons participating in the virtual visit: patient/Nurse Health Advisor   I discussed the limitations, risks, security and privacy concerns of performing an evaluation and management service by telephone and the availability of in person appointments. The patient expressed understanding and agreed to proceed.  Interactive audio and video telecommunications were attempted between this nurse and patient, however failed, due to patient having technical difficulties OR patient did not have access to video capability.  We continued and completed visit with audio only.  Some vital signs may be absent or patient reported.   Chriss Driver, LPN  Review of Systems     Cardiac Risk Factors include: advanced age (>50men, >35 women);hypertension;dyslipidemia;sedentary lifestyle;obesity (BMI >30kg/m2)   PHONE VISIT. PT AT HOME. NURSE AT [facility].    Objective:    Today's Vitals   08/03/21 1033  Weight: 182 lb (82.6 kg)  Height: 5\' 5"  (1.651 m)   Body mass index is 30.29 kg/m.  Advanced Directives 08/03/2021 08/26/2020 04/04/2020 09/11/2019 10/13/2017 12/16/2016 12/11/2016  Does Patient Have a Medical Advance Directive? No No No No No No No  Type of Advance Directive - - - - - - -  Copy of Healthcare Power of Attorney in Chart? - - - - - - -  Would patient like information on creating a medical advance directive? No - Patient declined No - Patient declined No - Patient declined - No - Patient declined No - Patient declined -  Pre-existing out of facility DNR order (yellow form or pink MOST form) - - - - - - -    Current Medications  (verified) Outpatient Encounter Medications as of 08/03/2021  Medication Sig   predniSONE (DELTASONE) 20 MG tablet 3 tabs poqday 1-2, 2 tabs poqday 3-4, 1 tab poqday 5-6   apixaban (ELIQUIS) 5 MG TABS tablet Take 1 tablet (5 mg total) by mouth 2 (two) times daily.   Ascorbic Acid (VITAMIN C PO) Take 1 tablet by mouth daily.   colestipol (COLESTID) 1 g tablet Take by mouth.   Cyanocobalamin 1000 MCG CAPS Take 1 tablet by mouth daily.   dexlansoprazole (DEXILANT) 60 MG capsule Take 1 capsule (60 mg total) by mouth daily.   diclofenac Sodium (VOLTAREN) 1 % GEL APPLY TO AFFECTED AREAS 3 TIMES DAILY AS NEEDED.   diphenoxylate-atropine (LOMOTIL) 2.5-0.025 MG tablet Take 1 tablet by mouth 4 (four) times daily as needed for diarrhea or loose stools.   Ergocalciferol (VITAMIN D2) 50 MCG (2000 UT) TABS tAKE 1 Tablet daily   furosemide (LASIX) 40 MG tablet Take 1 tablet (40 mg total) by mouth 2 (two) times daily as needed for edema.   hydrocortisone 2.5 % cream APPLY RECTALLY TWICE DAILY AS NEEDED- please dispense with rectal tip   levothyroxine (SYNTHROID) 150 MCG tablet TAKE 1 TABLET BY MOUTH  DAILY BEFORE BREAKFAST   lidocaine (XYLOCAINE) 5 % ointment APPLY TOPICALLY 4 TIMES  DAILY AS NEEDED   magnesium oxide (MAG-OX) 400 MG tablet Take by mouth.   Magnesium Oxide 400 (240 Mg) MG TABS Take 1.5 tablet BID   morphine (MS CONTIN) 15 MG 12 hr tablet Take 1 tablet (15 mg total)  by mouth every 12 (twelve) hours.   Multiple Vitamin (MULTI-VITAMIN) tablet Take 2 tablets by mouth daily.   Multiple Vitamins-Minerals (MULTIVITAMIN ADULT, MINERALS,) TABS Take 1 tablet by mouth daily.   Multiple Vitamins-Minerals (MULTIVITAMIN PO) Take 1 tablet by mouth daily.   nortriptyline (PAMELOR) 25 MG capsule Take 1 capsule (25 mg total) by mouth at bedtime.   ondansetron (ZOFRAN-ODT) 8 MG disintegrating tablet DISSOLVE 1 TABLET ON THE  TONGUE EVERY 8 HOURS AS  NEEDED FOR NAUSEA   oxyCODONE (OXY IR/ROXICODONE) 5 MG immediate  release tablet Take 1 tablet (5 mg total) by mouth every 8 (eight) hours as needed for severe pain.   potassium chloride SA (KLOR-CON M) 20 MEQ tablet Take 1 tablet (20 mEq total) by mouth daily.   pravastatin (PRAVACHOL) 20 MG tablet Take 1 tablet (20 mg total) by mouth daily.   tiZANidine (ZANAFLEX) 4 MG tablet Take 1 tablet (4 mg total) by mouth every 6 (six) hours as needed for muscle spasms.   XIFAXAN 550 MG TABS tablet Take 550 mg by mouth 3 (three) times daily.   No facility-administered encounter medications on file as of 08/03/2021.    Allergies (verified) Bactrim [sulfamethoxazole-trimethoprim], Codeine, Lactose, Nitrofuran derivatives, Sulfa antibiotics, and Penicillins   History: Past Medical History:  Diagnosis Date   Acid reflux    Bowel obstruction ([place]) several   Recurrent SBO secondary to adhesions   Cancer [facility] 2011 [facility])   diagnosed in 2009 per pt. Invasive High grade Urothelial carcinoma- s/p radiation and Chemo    Chronic back pain    DVT (deep venous thrombosis) ([place]) 2015   Fatty liver    Gout    Hyperlipidemia    Hypertension    pt says taken off medication since has lost weight.   Hypothyroidism    Internal hemorrhoids    Leukopenia 12/06/2011   HIV serology negative   Malnutrition ([place])    PE (pulmonary embolism)    PER [facility] records   Rectal ulcer 2016   [facility]   UTI (lower urinary tract infection)    Recurrent   Past Surgical History:  Procedure Laterality Date   ABDOMINAL SURGERY     with intestinal "puncture" x 2, exploratory surgery    BACK SURGERY     X2   BLADDER REMOVAL     CHOLECYSTECTOMY     CYSTECTOMY     breast   HERNIA REPAIR     mesh   ILEO CONDUIT     For bladder cancer   PORTACATH PLACEMENT     Family History  Problem Relation Age of Onset   Heart disease Mother        enlarged   Hypertension Mother    Stroke Mother    Diabetes Father    Diabetes Sister    Diabetes Maternal Grandmother    Colon  cancer Neg Hx    Social History   Socioeconomic History   Marital status: Widowed    Spouse name: Not on file   Number of children: 2   Years of education: Not on file   Highest education level: Not on file  Occupational History    Employer: UNEMPLOYED  Tobacco Use   Smoking status: Some Days    Packs/day: 0.25    Years: 25.00    Pack years: 6.25    Types: Cigarettes    Last attempt to quit: 10/03/2016    Years since quitting: 4.8   Smokeless tobacco: Never  Vaping Use  Vaping Use: Never used  Substance and Sexual Activity   Alcohol use: No   Drug use: No   Sexual activity: Yes    Birth control/protection: Post-menopausal  Other Topics Concern   Not on file  Social History Narrative   Not on file   Social Determinants of Health   Financial Resource Strain: Low Risk    Difficulty of Paying Living Expenses: Not hard at all  Food Insecurity: No Food Insecurity   Worried About Charity fundraiser in the Last Year: Never true   [place] in the Last Year: Never true  Transportation Needs: No Transportation Needs   Lack of Transportation (Medical): No   Lack of Transportation (Non-Medical): No  Physical Activity: Insufficiently Active   Days of Exercise per Week: 3 days   Minutes of Exercise per Session: 20 min  Stress: No Stress Concern Present   Feeling of Stress : Not at all  Social Connections: Moderately Integrated   Frequency of Communication with Friends and Family: More than three times a week   Frequency of Social Gatherings with Friends and Family: More than three times a week   Attends Religious Services: More than 4 times per year   Active Member of [employer] or Organizations: Yes   Attends Archivist Meetings: More than 4 times per year   Marital Status: Widowed    Tobacco Counseling Ready to quit: Not Answered Counseling given: Not Answered   Clinical Intake:  Pre-visit preparation completed: Yes  Pain : No/denies pain     BMI -  recorded: 30.29 Nutritional Status: BMI > 30  Obese Nutritional Risks: None Diabetes: No  How often do you need to have someone help you when you read instructions, pamphlets, or other written materials from your doctor or pharmacy?: 1 - Never  Diabetic?NO  Interpreter Needed?: No  Information entered by :: mj Nezar Buckles, lpn   Activities of Daily Living In your present state of health, do you have any difficulty performing the following activities: 08/03/2021  Hearing? N  Vision? N  Difficulty concentrating or making decisions? N  Walking or climbing stairs? N  Dressing or bathing? N  Doing errands, shopping? N  Preparing Food and eating ? N  In the past six months, have you accidently leaked urine? N  Do you have problems with loss of bowel control? N  Managing your Medications? N  Managing your Finances? N  Housekeeping or managing your Housekeeping? N  Some recent data might be hidden    Patient Care Team: Susy Frizzle, MD as PCP - General (Family Medicine) Gala Romney, Cristopher Estimable, MD as Attending Physician (Gastroenterology)  Indicate any recent Medical Services you may have received from other than [facility] providers in the past year (date may be approximate).     Assessment:   This is a routine wellness examination for Sheliah.  Hearing/Vision screen Hearing Screening - Comments:: Wears hearing aids x 5 months. Vision Screening - Comments:: Glasses. [facility] [place]. 2021.  Dietary issues and exercise activities discussed: Current Exercise Habits: Home exercise routine, Type of exercise: Other - see comments (PT exercises), Time (Minutes): 20, Frequency (Times/Week): 3, Weekly Exercise (Minutes/Week): 60, Intensity: Mild   Goals Addressed             This Visit's Progress    Exercise 3x per week (30 min per time)       Keep doing exercises and continue to eat healthy.  Depression Screen PHQ 2/9 Scores 08/03/2021 04/28/2020 04/04/2020 03/09/2020 03/15/2019  02/12/2018 01/28/2018  PHQ - 2 Score 0 0 0 0 0 0 0  PHQ- 9 Score - - - - - - -    Fall Risk Fall Risk  08/03/2021 04/28/2020 04/04/2020 03/09/2020 03/09/2020  Falls in the past year? 0 0 0 0 0  Number falls in past yr: 0 0 - 0 0  Injury with Fall? 0 0 - 0 0  Risk for fall due to : Medication side effect - No Fall Risks - -  Follow up Falls prevention discussed Falls evaluation completed Falls evaluation completed - -    FALL RISK PREVENTION PERTAINING TO THE HOME:  Any stairs in or around the home? Yes  If so, are there any without handrails? No  Home free of loose throw rugs in walkways, pet beds, electrical cords, etc? Yes  Adequate lighting in your home to reduce risk of falls? Yes   ASSISTIVE DEVICES UTILIZED TO PREVENT FALLS:  Life alert? No  Use of a cane, walker or w/c? Yes  Grab bars in the bathroom? Yes  Shower chair or bench in shower? No  Elevated toilet seat or a handicapped toilet? No   TIMED UP AND GO:  Was the test performed? No .  Phone visit.  Cognitive Function:     6CIT Screen 08/03/2021  What Year? 0 points  What month? 0 points  What time? 0 points  Count back from 20 0 points  Months in reverse 0 points  Repeat phrase 0 points  Total Score 0    Immunizations Immunization History  Administered Date(s) Administered   PFIZER(Purple Top)SARS-COV-2 Vaccination 07/22/2019, 08/12/2019   PPD Test 01/23/2015   Tdap 04/18/2017    TDAP status: Up to date  Flu Vaccine status: Declined, Education has been provided regarding the importance of this vaccine but patient still declined. Advised may receive this vaccine at local pharmacy or Health Dept. Aware to provide a copy of the vaccination record if obtained from local pharmacy or Health Dept. Verbalized acceptance and understanding.  Pneumococcal vaccine status: Declined,  Education has been provided regarding the importance of this vaccine but patient still declined. Advised may receive this vaccine at local  pharmacy or Health Dept. Aware to provide a copy of the vaccination record if obtained from local pharmacy or Health Dept. Verbalized acceptance and understanding.   Covid-19 vaccine status: Completed vaccines  Qualifies for Shingles Vaccine? Yes   Zostavax completed No   Shingrix Completed?: No.    Education has been provided regarding the importance of this vaccine. Patient has been advised to call insurance company to determine out of pocket expense if they have not yet received this vaccine. Advised may also receive vaccine at local pharmacy or Health Dept. Verbalized acceptance and understanding.  Screening Tests Health Maintenance  Topic Date Due   Pneumonia Vaccine 24+ Years old (1 - PCV) Never done   Zoster Vaccines- Shingrix (1 of 2) Never done   COVID-19 Vaccine (3 - Pfizer risk series) 09/09/2019   MAMMOGRAM  05/15/2020   INFLUENZA VACCINE  Never done   TETANUS/TDAP  01/28/2029 (Originally 04/19/2027)   COLONOSCOPY (Pts 45-41yrs Insurance coverage will need to be confirmed)  06/09/2024   DEXA SCAN  Completed   Hepatitis C Screening  Completed   HPV VACCINES  Aged Out    Health Maintenance  Health Maintenance Due  Topic Date Due   Pneumonia Vaccine 68+ Years old (77 -  PCV) Never done   Zoster Vaccines- Shingrix (1 of 2) Never done   COVID-19 Vaccine (3 - Pfizer risk series) 09/09/2019   MAMMOGRAM  05/15/2020   INFLUENZA VACCINE  Never done    Colorectal cancer screening: Type of screening: Colonoscopy. Completed 06/09/2014. Repeat every 10 years  Mammogram status: Ordered 08/03/2021. Pt provided with contact info and advised to call to schedule appt.   Bone Density status: Ordered 08/03/2021. Pt provided with contact info and advised to call to schedule appt.  Lung Cancer Screening: (Low Dose CT Chest recommended if Age 75-80 years, 30 pack-year currently smoking OR have quit w/in 15years.) does qualify.   Lung Cancer Screening Referral: Order placed.  Additional  Screening:  Hepatitis C Screening: does qualify; Completed 01/28/2018  Vision Screening: Recommended annual ophthalmology exams for early detection of glaucoma and other disorders of the eye. Is the patient up to date with their annual eye exam?  No  Who is the provider or what is the name of the office in which the patient attends annual eye exams? [facility] [place] If pt is not established with a provider, would they like to be referred to a provider to establish care? No .   Dental Screening: Recommended annual dental exams for proper oral hygiene  Community Resource Referral / Chronic Care Management: CRR required this visit?  No   CCM required this visit?  No      Plan:     I have personally reviewed and noted the following in the patients chart:   Medical and social history Use of alcohol, tobacco or illicit drugs  Current medications and supplements including opioid prescriptions.  Functional ability and status Nutritional status Physical activity Advanced directives List of other physicians Hospitalizations, surgeries, and ER visits in previous 12 months Vitals Screenings to include cognitive, depression, and falls Referrals and appointments  In addition, I have reviewed and discussed with patient certain preventive protocols, quality metrics, and best practice recommendations. A written personalized care plan for preventive services as well as general preventive health recommendations were provided to patient.     Chriss Driver, LPN   03/04/5037   Nurse Notes: Discussed lung cancer screening, mammogram and bone density. Pt would like to order all exams. Order placed for each. Pt declined vaccines due to needle phobia.

## 2021-08-03 NOTE — Patient Instructions (Signed)
[name] , Thank you for taking time to come for your Medicare Wellness Visit. I appreciate your ongoing commitment to your health goals. Please review the following plan we discussed and let me know if I can assist you in the future.   Screening recommendations/referrals: Colonoscopy: Done 06/09/2014. Repeat in 10 years  Mammogram: Order placed today to schedule. Bone Density: Order placed today to schedule. Recommended yearly ophthalmology/optometry visit for glaucoma screening and checkup Recommended yearly dental visit for hygiene and checkup  Vaccinations: Influenza vaccine: Declined. Pneumococcal vaccine: Declined. Tdap vaccine: Done 04/18/2017 Repeat in 10 years  Shingles vaccine: Declined.   Covid-19:Done 07/22/19 and 08/12/19.  Advanced directives: Advance directive discussed with you today. Even though you declined this today, please call our office should you change your mind, and we can give you the proper paperwork for you to fill out.   Conditions/risks identified: Aim for 30 minutes of exercise or brisk walking each day, drink 6-8 glasses of water and eat lots of fruits and vegetables.   Next appointment: Follow up in one year for your annual wellness visit 2024.   Preventive Care 21 Years and Older, Female Preventive care refers to lifestyle choices and visits with your health care provider that can promote health and wellness. What does preventive care include? A yearly physical exam. This is also called an annual well check. Dental exams once or twice a year. Routine eye exams. Ask your health care provider how often you should have your eyes checked. Personal lifestyle choices, including: Daily care of your teeth and gums. Regular physical activity. Eating a healthy diet. Avoiding tobacco and drug use. Limiting alcohol use. Practicing safe sex. Taking low-dose aspirin every day. Taking vitamin and mineral supplements as recommended by your health care  provider. What happens during an annual well check? The services and screenings done by your health care provider during your annual well check will depend on your age, overall health, lifestyle risk factors, and family history of disease. Counseling  Your health care provider may ask you questions about your: Alcohol use. Tobacco use. Drug use. Emotional well-being. Home and relationship well-being. Sexual activity. Eating habits. History of falls. Memory and ability to understand (cognition). Work and work Statistician. Reproductive health. Screening  You may have the following tests or measurements: Height, weight, and BMI. Blood pressure. Lipid and cholesterol levels. These may be checked every 5 years, or more frequently if you are over 38 years old. Skin check. Lung cancer screening. You may have this screening every year starting at age 64 if you have a 30-pack-year history of smoking and currently smoke or have quit within the past 15 years. Fecal occult blood test (FOBT) of the stool. You may have this test every year starting at age 74. Flexible sigmoidoscopy or colonoscopy. You may have a sigmoidoscopy every 5 years or a colonoscopy every 10 years starting at age 51. Hepatitis C blood test. Hepatitis B blood test. Sexually transmitted disease (STD) testing. Diabetes screening. This is done by checking your blood sugar (glucose) after you have not eaten for a while (fasting). You may have this done every 1-3 years. Bone density scan. This is done to screen for osteoporosis. You may have this done starting at age 97. Mammogram. This may be done every 1-2 years. Talk to your health care provider about how often you should have regular mammograms. Talk with your health care provider about your test results, treatment options, and if necessary, the need for more tests.  Vaccines  Your health care provider may recommend certain vaccines, such as: Influenza vaccine. This is  recommended every year. Tetanus, diphtheria, and acellular pertussis (Tdap, Td) vaccine. You may need a Td booster every 10 years. Zoster vaccine. You may need this after age 53. Pneumococcal 13-valent conjugate (PCV13) vaccine. One dose is recommended after age 80. Pneumococcal polysaccharide (PPSV23) vaccine. One dose is recommended after age 20. Talk to your health care provider about which screenings and vaccines you need and how often you need them. This information is not intended to replace advice given to you by your health care provider. Make sure you discuss any questions you have with your health care provider. Document Released: 07/14/2015 Document Revised: 03/06/2016 Document Reviewed: 04/18/2015 Elsevier Interactive Patient Education  2017 [place] Prevention in the Home Falls can cause injuries. They can happen to people of all ages. There are many things you can do to make your home safe and to help prevent falls. What can I do on the outside of my home? Regularly fix the edges of walkways and driveways and fix any cracks. Remove anything that might make you trip as you walk through a door, such as a raised step or threshold. Trim any bushes or trees on the path to your home. Use bright outdoor lighting. Clear any walking paths of anything that might make someone trip, such as rocks or tools. Regularly check to see if handrails are loose or broken. Make sure that both sides of any steps have handrails. Any raised decks and porches should have guardrails on the edges. Have any leaves, snow, or ice cleared regularly. Use sand or salt on walking paths during winter. Clean up any spills in your garage right away. This includes oil or grease spills. What can I do in the bathroom? Use night lights. Install grab bars by the toilet and in the tub and shower. Do not use towel bars as grab bars. Use non-skid mats or decals in the tub or shower. If you need to sit down in  the shower, use a plastic, non-slip stool. Keep the floor dry. Clean up any water that spills on the floor as soon as it happens. Remove soap buildup in the tub or shower regularly. Attach bath mats securely with double-sided non-slip rug tape. Do not have throw rugs and other things on the floor that can make you trip. What can I do in the bedroom? Use night lights. Make sure that you have a light by your bed that is easy to reach. Do not use any sheets or blankets that are too big for your bed. They should not hang down onto the floor. Have a firm chair that has side arms. You can use this for support while you get dressed. Do not have throw rugs and other things on the floor that can make you trip. What can I do in the kitchen? Clean up any spills right away. Avoid walking on wet floors. Keep items that you use a lot in easy-to-reach places. If you need to reach something above you, use a strong step stool that has a grab bar. Keep electrical cords out of the way. Do not use floor polish or wax that makes floors slippery. If you must use wax, use non-skid floor wax. Do not have throw rugs and other things on the floor that can make you trip. What can I do with my stairs? Do not leave any items on the stairs. Make sure that  there are handrails on both sides of the stairs and use them. Fix handrails that are broken or loose. Make sure that handrails are as long as the stairways. Check any carpeting to make sure that it is firmly attached to the stairs. Fix any carpet that is loose or worn. Avoid having throw rugs at the top or bottom of the stairs. If you do have throw rugs, attach them to the floor with carpet tape. Make sure that you have a light switch at the top of the stairs and the bottom of the stairs. If you do not have them, ask someone to add them for you. What else can I do to help prevent falls? Wear shoes that: Do not have high heels. Have rubber bottoms. Are comfortable  and fit you well. Are closed at the toe. Do not wear sandals. If you use a stepladder: Make sure that it is fully opened. Do not climb a closed stepladder. Make sure that both sides of the stepladder are locked into place. Ask someone to hold it for you, if possible. Clearly mark and make sure that you can see: Any grab bars or handrails. First and last steps. Where the edge of each step is. Use tools that help you move around (mobility aids) if they are needed. These include: Canes. Walkers. Scooters. Crutches. Turn on the lights when you go into a dark area. Replace any light bulbs as soon as they burn out. Set up your furniture so you have a clear path. Avoid moving your furniture around. If any of your floors are uneven, fix them. If there are any pets around you, be aware of where they are. Review your medicines with your doctor. Some medicines can make you feel dizzy. This can increase your chance of falling. Ask your doctor what other things that you can do to help prevent falls. This information is not intended to replace advice given to you by your health care provider. Make sure you discuss any questions you have with your health care provider. Document Released: 04/13/2009 Document Revised: 11/23/2015 Document Reviewed: 07/22/2014 Elsevier Interactive Patient Education  2017 [employer].

## 2021-08-21 ENCOUNTER — Other Ambulatory Visit: Payer: Self-pay

## 2021-08-21 MED ORDER — MORPHINE SULFATE ER 15 MG PO TBCR: 15.0000 mg | EXTENDED_RELEASE_TABLET | Freq: Two times a day (BID) | ORAL | 0 refills | Status: DC

## 2021-08-21 MED ORDER — OXYCODONE HCL 5 MG PO TABS: 5.0000 mg | ORAL_TABLET | Freq: Three times a day (TID) | ORAL | 0 refills | Status: DC | PRN

## 2021-08-21 NOTE — Telephone Encounter (Signed)
LOV 04/09/21 Last refill  Oxycodone  07/24/21, #90, 0 refills Morphine  07/24/21, #60, 0 refills  Please review, thanks!

## 2021-08-23 ENCOUNTER — Other Ambulatory Visit: Payer: Self-pay | Admitting: Family Medicine

## 2021-08-23 ENCOUNTER — Ambulatory Visit (INDEPENDENT_AMBULATORY_CARE_PROVIDER_SITE_OTHER): Payer: Medicare Other | Admitting: Family Medicine

## 2021-08-23 ENCOUNTER — Telehealth: Payer: Self-pay | Admitting: Family Medicine

## 2021-08-23 ENCOUNTER — Other Ambulatory Visit: Payer: Self-pay

## 2021-08-23 ENCOUNTER — Encounter: Payer: Self-pay | Admitting: Family Medicine

## 2021-08-23 VITALS — BP 124/82 | HR 79 | Temp 97.2°F | Resp 18 | Ht 65.0 in | Wt 180.0 lb

## 2021-08-23 DIAGNOSIS — M6289 Other specified disorders of muscle: Secondary | ICD-10-CM

## 2021-08-23 DIAGNOSIS — Z1231 Encounter for screening mammogram for malignant neoplasm of breast: Secondary | ICD-10-CM

## 2021-08-23 MED ORDER — PREGABALIN 75 MG PO CAPS: 75.0000 mg | ORAL_CAPSULE | Freq: Two times a day (BID) | ORAL | 0 refills | Status: DC

## 2021-08-23 NOTE — Telephone Encounter (Signed)
Patient received referral to therapist in [facility] Ayesha Mohair) today; requesting appt be set up asap.  Please advise at [phone].

## 2021-08-23 NOTE — Progress Notes (Signed)
Subjective:    Patient ID: [name], female    DOB: 12-Jul-1950, 71 y.o.   MRN: [id number]  HPI  Patient is a very pleasant 71 year old African-American female with a very complicated past medical history.  She states that in the late 90s around 1998 she had back surgery.  At that time she had a accidental perforation in her intestines during the back surgery that required surgical correction.  Then in 2012 she was found to have bladder cancer.  She underwent surgical treatment of her bladder cancer and suffered a perforation to her intestines as well.  Ultimately she developed a small bowel obstruction due to adhesions and was unable to eat.  She states she lost more than 100 pounds.  She ultimately was admitted to [facility] and underwent TPN.  She required TPN for 3 years from 2012 for 2015 due to inability to tolerate oral food because of bowel obstructions.  Ultimately she underwent surgery in 2015 after she developed a fistula at the stoma of her bladder after her bladder was removed due to bladder cancer.  During the surgery to correct her urostomy, the patient also went lysis of adhesions.  At that point the bowel obstruction resolved and the patient was able to start tolerating oral food again.  She is on Eliquis due to recurrent DVTs in her upper extremities stemming from her long-term use of TPN.  She is on chronic pain medication.  Patient has terrible rectal pain.  She states that it feels like she is being torn in half every time she has a bowel movement.  She had pelvic floor muscular dysfunction due to the severity of the pain and she is working with a specialist in [place].  Prior to working with a specialist for pelvic floor dysfunction, the patient was having 30 bowel movements a day that were searing and tearing with pain.  She has seen numerous specialist who "cannot find anything wrong" however she continues to have horrific pain with defecation.  However the specialist has  been able to reduce her frequency of BMs to 5 or 6 a day which is made her quality of life much better.  She still takes MS Contin twice a day and oxycodone 2-3 times a day due to the severity of pain.  A lot of her pain sounds neuropathic in nature versus also rectal fissures due to the end complete relaxation of the anal sphincter.    The above was copied from a previous visit as a summary of her PMH.  She has a history of pelvic floor dysfunction.  She typically has 4-5 bowel movements a day.  At times they are soft.  They cause burning and searing pain despite the lidocaine ointment.  However recently she has been dealing with constipation.  She states that the bowel movements are hard and more, "like having a baby".  She tried docusate with no success.  She denies melena or hematochezia.  She is requesting a referral for physical therapy for pelvic floor dysfunction back to the specialist she sees at [facility]. Past Medical History:  Diagnosis Date   Acid reflux    Bowel obstruction ([place]) several   Recurrent SBO secondary to adhesions   Cancer [facility]) 2011 [facility])   diagnosed in 2009 per pt. Invasive High grade Urothelial carcinoma- s/p radiation and Chemo    Chronic back pain    DVT (deep venous thrombosis) ([place]) 2015   Fatty liver    Gout  Hyperlipidemia    Hypertension    pt says taken off medication since has lost weight.   Hypothyroidism    Internal hemorrhoids    Leukopenia 12/06/2011   HIV serology negative   Malnutrition ([place])    PE (pulmonary embolism)    PER [facility] records   Rectal ulcer 2016   [facility]   UTI (lower urinary tract infection)    Recurrent   Past Surgical History:  Procedure Laterality Date   ABDOMINAL SURGERY     with intestinal "puncture" x 2, exploratory surgery    BACK SURGERY     X2   BLADDER REMOVAL     CHOLECYSTECTOMY     CYSTECTOMY     breast   HERNIA REPAIR     mesh   ILEO CONDUIT     For bladder cancer   PORTACATH PLACEMENT      Current Outpatient Medications on File Prior to Visit  Medication Sig Dispense Refill   apixaban (ELIQUIS) 5 MG TABS tablet Take 1 tablet (5 mg total) by mouth 2 (two) times daily. 180 tablet 3   Ascorbic Acid (VITAMIN C PO) Take 1 tablet by mouth daily.     colestipol (COLESTID) 1 g tablet Take by mouth.     Cyanocobalamin 1000 MCG CAPS Take 1 tablet by mouth daily. 90 capsule 3   dexlansoprazole (DEXILANT) 60 MG capsule Take 1 capsule (60 mg total) by mouth daily. 90 capsule 3   diclofenac Sodium (VOLTAREN) 1 % GEL APPLY TO AFFECTED AREAS 3 TIMES DAILY AS NEEDED. 100 g 0   diphenoxylate-atropine (LOMOTIL) 2.5-0.025 MG tablet Take 1 tablet by mouth 4 (four) times daily as needed for diarrhea or loose stools. 120 tablet 2   Ergocalciferol (VITAMIN D2) 50 MCG (2000 UT) TABS tAKE 1 Tablet daily 30 tablet    furosemide (LASIX) 40 MG tablet Take 1 tablet (40 mg total) by mouth 2 (two) times daily as needed for edema. 180 tablet 3   hydrocortisone 2.5 % cream APPLY RECTALLY TWICE DAILY AS NEEDED- please dispense with rectal tip 28.35 g 11   levothyroxine (SYNTHROID) 150 MCG tablet TAKE 1 TABLET BY MOUTH  DAILY BEFORE BREAKFAST 90 tablet 3   lidocaine (XYLOCAINE) 5 % ointment APPLY TOPICALLY 4 TIMES  DAILY AS NEEDED 318.96 g 1   magnesium oxide (MAG-OX) 400 MG tablet Take by mouth.     Magnesium Oxide 400 (240 Mg) MG TABS Take 1.5 tablet BID 90 tablet 2   morphine (MS CONTIN) 15 MG 12 hr tablet Take 1 tablet (15 mg total) by mouth every 12 (twelve) hours. 60 tablet 0   Multiple Vitamin (MULTI-VITAMIN) tablet Take 2 tablets by mouth daily.     Multiple Vitamins-Minerals (MULTIVITAMIN ADULT, MINERALS,) TABS Take 1 tablet by mouth daily.     Multiple Vitamins-Minerals (MULTIVITAMIN PO) Take 1 tablet by mouth daily.     nortriptyline (PAMELOR) 25 MG capsule Take 1 capsule (25 mg total) by mouth at bedtime. 30 capsule 3   ondansetron (ZOFRAN-ODT) 8 MG disintegrating tablet DISSOLVE 1 TABLET ON THE   TONGUE EVERY 8 HOURS AS  NEEDED FOR NAUSEA 90 tablet 3   oxyCODONE (OXY IR/ROXICODONE) 5 MG immediate release tablet Take 1 tablet (5 mg total) by mouth every 8 (eight) hours as needed for severe pain. 90 tablet 0   potassium chloride SA (KLOR-CON M) 20 MEQ tablet Take 1 tablet (20 mEq total) by mouth daily. 90 tablet 3   pravastatin (PRAVACHOL) 20 MG tablet  Take 1 tablet (20 mg total) by mouth daily. 90 tablet 3   predniSONE (DELTASONE) 20 MG tablet 3 tabs poqday 1-2, 2 tabs poqday 3-4, 1 tab poqday 5-6 12 tablet 0   tiZANidine (ZANAFLEX) 4 MG tablet Take 1 tablet (4 mg total) by mouth every 6 (six) hours as needed for muscle spasms. 30 tablet 0   XIFAXAN 550 MG TABS tablet Take 550 mg by mouth 3 (three) times daily.     No current facility-administered medications on file prior to visit.   Allergies  Allergen Reactions   Bactrim [Sulfamethoxazole-Trimethoprim]    Codeine Hives   Lactose Diarrhea   Nitrofuran Derivatives Itching   Sulfa Antibiotics Hives   Penicillins Rash    Has patient had a PCN reaction causing immediate rash, facial/tongue/throat swelling, SOB or lightheadedness with hypotension: No Has patient had a PCN reaction causing severe rash involving mucus membranes or skin necrosis: No Has patient had a PCN reaction that required hospitalization: No Has patient had a PCN reaction occurring within the last 10 years: No If all of the above answers are "NO", then may proceed with Cephalosporin use.  Childhood rash - no adult reactions known   Social History   Socioeconomic History   Marital status: Widowed    Spouse name: Not on file   Number of children: 2   Years of education: Not on file   Highest education level: Not on file  Occupational History    Employer: UNEMPLOYED  Tobacco Use   Smoking status: Some Days    Packs/day: 0.25    Years: 25.00    Pack years: 6.25    Types: Cigarettes    Last attempt to quit: 10/03/2016    Years since quitting: 4.8    Smokeless tobacco: Never  Vaping Use   Vaping Use: Never used  Substance and Sexual Activity   Alcohol use: No   Drug use: No   Sexual activity: Yes    Birth control/protection: Post-menopausal  Other Topics Concern   Not on file  Social History Narrative   Not on file   Social Determinants of Health   Financial Resource Strain: Low Risk    Difficulty of Paying Living Expenses: Not hard at all  Food Insecurity: No Food Insecurity   Worried About Charity fundraiser in the Last Year: Never true   [place] in the Last Year: Never true  Transportation Needs: No Transportation Needs   Lack of Transportation (Medical): No   Lack of Transportation (Non-Medical): No  Physical Activity: Insufficiently Active   Days of Exercise per Week: 3 days   Minutes of Exercise per Session: 20 min  Stress: No Stress Concern Present   Feeling of Stress : Not at all  Social Connections: Moderately Integrated   Frequency of Communication with Friends and Family: More than three times a week   Frequency of Social Gatherings with Friends and Family: More than three times a week   Attends Religious Services: More than 4 times per year   Active Member of [employer] or Organizations: Yes   Attends Archivist Meetings: More than 4 times per year   Marital Status: Widowed  Human resources officer Violence: Not At Risk   Fear of Current or Ex-Partner: No   Emotionally Abused: No   Physically Abused: No   Sexually Abused: No       Review of Systems  All other systems reviewed and are negative.     Objective:  Physical Exam Vitals reviewed.  Eyes:     General: No scleral icterus.       Right eye: No discharge.        Left eye: No discharge.     Conjunctiva/sclera: Conjunctivae normal.     Pupils: Pupils are equal, round, and reactive to light.  Cardiovascular:     Rate and Rhythm: Normal rate and regular rhythm.     Heart sounds: Normal heart sounds. No murmur heard. Pulmonary:      Effort: Pulmonary effort is normal. No respiratory distress.     Breath sounds: Normal breath sounds. No wheezing or rales.  Chest:     Chest wall: No tenderness.  Abdominal:     General: Bowel sounds are normal. There is no distension.     Palpations: Abdomen is soft. There is no mass.     Tenderness: There is no abdominal tenderness. There is no guarding or rebound.  Skin:    General: Skin is warm.     Findings: No erythema or rash.  Neurological:     Mental Status: She is alert and oriented to person, place, and time.     Cranial Nerves: No cranial nerve deficit.     Motor: No abnormal muscle tone.     Coordination: Coordination normal.     Deep Tendon Reflexes: Reflexes are normal and symmetric.   No bruit       Assessment & Plan:  Pelvic floor dysfunction in female - Plan: Ambulatory referral to Physical Therapy For the hard large stools I have recommended adding Linzess 145 mcg daily in addition to the Colace.  I recommended that she discontinue colestipol.  If this is not helping she can add a fleets enema to try to soften the stool.  If this is not working we may need to add Movantik due to her chronic opioid use.  I will also add Lyrica 75 mg p.o. twice daily for nerve pain around the rectum to see if this helps with the burning and searing pain that she experiences with defecation.

## 2021-08-24 NOTE — Telephone Encounter (Signed)
Outbound call placed to advise patient. Gave patient your number to call you back; she didn't realize she had missed your calls. Thank you!

## 2021-08-29 ENCOUNTER — Telehealth: Payer: Self-pay

## 2021-08-29 NOTE — Telephone Encounter (Signed)
Pt called in stating that she was recently given a sample of Linzess at her last ov. Pt states that this med has seemed to help and would like to get a prescription for this med. Please advise. ? ?Cb#: [phone] ?

## 2021-08-30 ENCOUNTER — Ambulatory Visit
Admission: RE | Admit: 2021-08-30 | Discharge: 2021-08-30 | Disposition: A | Discharge status: Home / Self Care | Payer: Medicare Other | Source: Ambulatory Visit | Attending: Family Medicine | Admitting: Family Medicine

## 2021-08-30 DIAGNOSIS — Z1231 Encounter for screening mammogram for malignant neoplasm of breast: Secondary | ICD-10-CM

## 2021-08-30 MED ORDER — LINACLOTIDE 145 MCG PO CAPS: 145.0000 ug | ORAL_CAPSULE | Freq: Every day | ORAL | 5 refills | Status: DC

## 2021-08-30 NOTE — Telephone Encounter (Signed)
Rx sent to pharmacy   

## 2021-09-11 ENCOUNTER — Other Ambulatory Visit: Payer: Self-pay | Admitting: Family Medicine

## 2021-09-11 DIAGNOSIS — E038 Other specified hypothyroidism: Secondary | ICD-10-CM

## 2021-09-14 ENCOUNTER — Telehealth: Payer: Self-pay | Admitting: Family Medicine

## 2021-09-14 DIAGNOSIS — Z433 Encounter for attention to colostomy: Secondary | ICD-10-CM

## 2021-09-14 NOTE — Telephone Encounter (Signed)
Patient called to follow up on refill requests for item; scripts expired.  ? ?Patient requesting colostomy bags and pouch; only has enough to last her over the weekend. ? ?Requesting for refill request to be faxed to [facility], fax # [phone]. ? ?Please advise at [phone]. ?

## 2021-09-14 NOTE — Telephone Encounter (Signed)
Signed order and pt demographics faxed to [facility] by front staff.  ?

## 2021-09-14 NOTE — Telephone Encounter (Signed)
Per Anderson Malta she has attempted fax multiple times and the fax is not going through. ? ?Called patient to advise her we have been attempting to send order to fax number she provided. ? ?Called [facility] and received the following fax numbers: ?[phone] ?[phone] ? ?And phone number: ?[phone] ?For Wound and Ostomy Supplies ? ?Fax being sent to above numbers.  ?

## 2021-09-17 DIAGNOSIS — Z936 Other artificial openings of urinary tract status: Secondary | ICD-10-CM | POA: Diagnosis not present

## 2021-09-20 ENCOUNTER — Other Ambulatory Visit: Payer: Self-pay | Admitting: Family Medicine

## 2021-09-20 DIAGNOSIS — K6289 Other specified diseases of anus and rectum: Secondary | ICD-10-CM | POA: Diagnosis not present

## 2021-09-20 DIAGNOSIS — K625 Hemorrhage of anus and rectum: Secondary | ICD-10-CM | POA: Diagnosis not present

## 2021-09-20 NOTE — Telephone Encounter (Signed)
Patient called to request refills of ? ?oxyCODONE (OXY IR/ROXICODONE) 5 MG immediate release tablet [[id number]]  ? ?morphine (MS CONTIN) 15 MG 12 hr tablet [[id number]] ? ? ?Pharmacy confirmed as ? ?[facility], [address]  ?[address] [place], [place] [address]  ?Phone:  [phone]  Fax:  [phone]  ? ?Please advise at [phone]. ?

## 2021-09-21 NOTE — Telephone Encounter (Signed)
oxCodone ?LOV 08/23/21 ?Last refill 08/21/21, #90, 0 refills ? ?Morphine ? ?Last refill 08/21/21, #60, 0 refills ? ?Please review, thanks! ? ? ? ?Please review, thanks! ? ? ?

## 2021-09-24 MED ORDER — MORPHINE SULFATE ER 15 MG PO TBCR: 15.0000 mg | EXTENDED_RELEASE_TABLET | Freq: Two times a day (BID) | ORAL | 0 refills | Status: DC

## 2021-09-24 MED ORDER — OXYCODONE HCL 5 MG PO TABS: 5.0000 mg | ORAL_TABLET | Freq: Three times a day (TID) | ORAL | 0 refills | Status: DC | PRN

## 2021-09-25 ENCOUNTER — Telehealth: Payer: Self-pay

## 2021-09-25 NOTE — Telephone Encounter (Signed)
error 

## 2021-09-27 DIAGNOSIS — K5900 Constipation, unspecified: Secondary | ICD-10-CM | POA: Diagnosis not present

## 2021-09-27 DIAGNOSIS — K6289 Other specified diseases of anus and rectum: Secondary | ICD-10-CM | POA: Diagnosis not present

## 2021-09-27 DIAGNOSIS — M6289 Other specified disorders of muscle: Secondary | ICD-10-CM | POA: Diagnosis not present

## 2021-10-17 ENCOUNTER — Other Ambulatory Visit: Payer: Self-pay

## 2021-10-17 DIAGNOSIS — Z936 Other artificial openings of urinary tract status: Secondary | ICD-10-CM | POA: Diagnosis not present

## 2021-10-17 NOTE — Telephone Encounter (Signed)
Pt called in to request a refill of  ? ?pregabalin (LYRICA) 75 MG capsule [[id number]]  ?  Order Details ?Dose: 75 mg Route: Oral Frequency: 2 times daily  ?Dispense Quantity: 60 capsule Refills: 0   ?     ?Sig: Take 1 capsule (75 mg total) by mouth 2 (two) times daily.  ?     ? ?Cb#: [id number] ?

## 2021-10-17 NOTE — Telephone Encounter (Signed)
LOV 08/23/21 ?Last refill 08/23/21, #60, 0 refills ? ?Please review, thanks! ? ? ?

## 2021-10-18 MED ORDER — PREGABALIN 75 MG PO CAPS
75.0000 mg | ORAL_CAPSULE | Freq: Two times a day (BID) | ORAL | 0 refills | Status: DC
Start: 2021-10-18 — End: 2023-07-01

## 2021-10-25 DIAGNOSIS — M6289 Other specified disorders of muscle: Secondary | ICD-10-CM | POA: Diagnosis not present

## 2021-10-25 DIAGNOSIS — R198 Other specified symptoms and signs involving the digestive system and abdomen: Secondary | ICD-10-CM | POA: Diagnosis not present

## 2021-10-25 DIAGNOSIS — K6289 Other specified diseases of anus and rectum: Secondary | ICD-10-CM | POA: Diagnosis not present

## 2021-10-25 DIAGNOSIS — K5900 Constipation, unspecified: Secondary | ICD-10-CM | POA: Diagnosis not present

## 2021-10-29 ENCOUNTER — Other Ambulatory Visit: Payer: Self-pay

## 2021-10-29 MED ORDER — MORPHINE SULFATE ER 15 MG PO TBCR: 15.0000 mg | EXTENDED_RELEASE_TABLET | Freq: Two times a day (BID) | ORAL | 0 refills | Status: DC

## 2021-10-29 MED ORDER — OXYCODONE HCL 5 MG PO TABS: 5.0000 mg | ORAL_TABLET | Freq: Three times a day (TID) | ORAL | 0 refills | Status: DC | PRN

## 2021-10-29 NOTE — Telephone Encounter (Signed)
Morhin ?LOV 08/23/21 ?Last refill 09/04/21, #60, 0 refills ? ?Oxycodone ?LOV 08/23/21 ?Last refill 09/24/21, #90,  refills ? ?Please review, thanks! ? ? ?

## 2021-11-08 ENCOUNTER — Other Ambulatory Visit: Payer: Self-pay | Admitting: Family Medicine

## 2021-11-09 NOTE — Telephone Encounter (Signed)
Pt needs appointment

## 2021-11-09 NOTE — Telephone Encounter (Signed)
Requested Prescriptions  ?Pending Prescriptions Disp Refills  ?? pravastatin (PRAVACHOL) 20 MG tablet [Pharmacy Med Name: Pravastatin Sodium 20 MG Oral Tablet] 90 tablet 3  ?  Sig: TAKE 1 TABLET BY MOUTH  DAILY  ?  ? Cardiovascular:  Antilipid - Statins Failed - 11/08/2021 11:22 PM  ?  ?  Failed - Lipid Panel in normal range within the last 12 months  ?  Cholesterol  ?Date Value Ref Range Status  ?11/30/2020 147 <200 mg/dL Final  ? ?LDL Cholesterol (Calc)  ?Date Value Ref Range Status  ?11/30/2020 72 mg/dL (calc) Final  ?  Comment:  ?  Reference range: <100 ?Marland Kitchen ?Desirable range <100 mg/dL for primary prevention;   ?<70 mg/dL for patients with CHD or diabetic patients  ?with > or = 2 CHD risk factors. ?. ?LDL-C is now calculated using the Martin-Hopkins  ?calculation, which is a validated novel method providing  ?better accuracy than the Friedewald equation in the  ?estimation of LDL-C.  ?Cresenciano Genre et al. Annamaria Helling. 2979;892(11): 2061-2068  ?(http://education.[facility].com/faq/FAQ164) ?  ? ?HDL  ?Date Value Ref Range Status  ?11/30/2020 42 (L) > OR = 50 mg/dL Final  ? ?Triglycerides  ?Date Value Ref Range Status  ?11/30/2020 240 (H) <150 mg/dL Final  ?  Comment:  ?  . ?If a non-fasting specimen was collected, consider ?repeat triglyceride testing on a fasting specimen ?if clinically indicated.  ?Garald Balding al. J. of Clin. Lipidol. 9417;4:081-448. ?. ?  ? ?  ?  ?  Passed - Patient is not pregnant  ?  ?  Passed - Valid encounter within last 12 months  ?  Recent Outpatient Visits   ?      ? 2 months ago Pelvic floor dysfunction in female  ? [facility] [place], Cammie Mcgee, MD  ? 4 months ago No-show for appointment  ? [place], Jessica A, NP  ? 7 months ago Right arm pain  ? [facility] Pickard, Cammie Mcgee, MD  ? 7 months ago Lateral epicondylitis of right elbow  ? [facility] [place], Cammie Mcgee, MD  ? 11 months ago Hypothyroidism, unspecified  type  ? [facility] [place], Cammie Mcgee, MD  ?  ?  ?Future Appointments   ?        ? In 4 months McKenzie, Candee Furbish, MD [place]  ?  ? ?  ?  ?  ? ? ?

## 2021-11-13 ENCOUNTER — Encounter: Payer: Medicare Other | Attending: Family Medicine | Admitting: Nutrition

## 2021-11-13 ENCOUNTER — Encounter: Payer: Self-pay | Admitting: Nutrition

## 2021-11-13 VITALS — Ht 65.5 in | Wt 187.0 lb

## 2021-11-13 DIAGNOSIS — E669 Obesity, unspecified: Secondary | ICD-10-CM | POA: Insufficient documentation

## 2021-11-13 DIAGNOSIS — E782 Mixed hyperlipidemia: Secondary | ICD-10-CM | POA: Insufficient documentation

## 2021-11-13 DIAGNOSIS — E441 Mild protein-calorie malnutrition: Secondary | ICD-10-CM | POA: Insufficient documentation

## 2021-11-13 DIAGNOSIS — R7303 Prediabetes: Secondary | ICD-10-CM | POA: Insufficient documentation

## 2021-11-13 DIAGNOSIS — K760 Fatty (change of) liver, not elsewhere classified: Secondary | ICD-10-CM | POA: Insufficient documentation

## 2021-11-13 DIAGNOSIS — I10 Essential (primary) hypertension: Secondary | ICD-10-CM | POA: Insufficient documentation

## 2021-11-13 DIAGNOSIS — Z9889 Other specified postprocedural states: Secondary | ICD-10-CM | POA: Insufficient documentation

## 2021-11-13 NOTE — Patient Instructions (Addendum)
Goals ?Go with eggs and oatmeal ?Increase fresh fruits and vegetables ?Try not to skip meals ?Avoid bananas and applesauce due to constipation ?Don't do anymore smoothies. ?Keep walking every day. ? ? ?

## 2021-11-13 NOTE — Progress Notes (Signed)
?Medical Nutrition Therapy:  Appt start time: 1030 end time:  1100 ? ?Obesity, Pre DM and  GI Issues ? ?Assessment:  Primary concerns today: Follow up for IBS and GI issues ?Feeling a lot better. Is working on eating a lot more fruits and vegetables. Drinking carrots, water and 2 bananas and 2 c of milk and makes a smoothie every  morning.. ? ?Gained 7 lbs since last visit. ? ?Was severe constipated last month. Is now on miralax and stool softener.Is on Linzess and a compound medication for her pain in her rectum. It helps a lot she notes. ?Is also on Lyrica ? ?Prediabetic with A1C 6.%. No recent A1C. ? ?PMH: Fatty liver, Hyperlipidemia, HTN, Obesity, ? ?Overall, nutritionally, she is doing much better. ? ? ?Wt Readings from Last 3 Encounters:  ?08/23/21 180 lb (81.6 kg)  ?08/03/21 182 lb (82.6 kg)  ?05/09/21 194 lb 2 oz (88.1 kg)  ? ?Ht Readings from Last 3 Encounters:  ?08/23/21 '5\' 5"'$  (1.651 m)  ?08/03/21 '5\' 5"'$  (1.651 m)  ?05/09/21 '5\' 5"'$  (1.651 m)  ? ?There is no height or weight on file to calculate BMI. ?'@BMIFA'$ @ ?Facility age limit for growth percentiles is 20 years. ?Facility age limit for growth percentiles is 20 years. ? ?  ?Lab Results  ?Component Value Date  ? HGBA1C 6.0 (H) 11/30/2020  ? ? ?  Latest Ref Rng & Units 11/30/2020  ?  9:05 AM 08/15/2020  ?  3:03 PM 04/04/2020  ?  3:40 PM  ?CMP  ?Glucose 65 - 99 mg/dL 108   133   104    ?BUN 7 - 25 mg/dL '18   17   18    '$ ?Creatinine 0.50 - 0.99 mg/dL 1.25   1.39   1.33    ?Sodium 135 - 146 mmol/L 138   143   138    ?Potassium 3.5 - 5.3 mmol/L 3.9   3.5   3.8    ?Chloride 98 - 110 mmol/L 103   107   102    ?CO2 20 - 32 mmol/L '25   24   26    '$ ?Calcium 8.6 - 10.4 mg/dL 9.1   9.2   9.3    ?Total Protein 6.1 - 8.1 g/dL 7.2   7.0   7.2    ?Total Bilirubin 0.2 - 1.2 mg/dL 0.6   0.6   0.6    ?AST 10 - 35 U/L '13   13   14    '$ ?ALT 6 - 29 U/L '13   11   10    '$ ? ?Lipid Panel  ?   ?Component Value Date/Time  ? CHOL 147 11/30/2020 0905  ? TRIG 240 (H) 11/30/2020 0905  ? HDL 42 (L)  11/30/2020 0905  ? CHOLHDL 3.5 11/30/2020 0905  ? VLDL 29 01/31/2016 1045  ? [place] 72 11/30/2020 0905  ? ? ?Preferred Learning Style: ?No preference indicated  ? ?Learning Readiness:  ?Ready ?Change in progress ? ? ?MEDICATIONS: ?  ?DIETARY INTAKE: ?B) egg, ham and cheese biscuit and coke for breakfast:  usually eating oatmeal and eggs or cherrios or frosted. ?L) skipped, water ?D) Sweet potato, cabbage and bologna sandwich, Water, gatorade ?Usual physical activity: ADL ? ?Estimated energy needs: ?1500  calories ?170  g carbohydrates ?112 g protein ?42 g fat ? ?Progress Towards Goal(s):  In progress. ?  ?Nutritional Diagnosis:  ?NI-5.11.1 Predicted suboptimal nutrient intake As related to bowel issues and malabsorption   As evidenced by  Low potassium, Low magnesium levels and chronic diarrhea >15 times per day. ? ?   ?Intervention:  FODMAP diet. Focusing on eating more nutrient dense foods, avoiding high fat, high sugar and processed foods. Drinking only water. Rncouraged more plant based foods and avoiding processed high fat foods. ? ? ?Goals ? ?Go with eggs and oatmeal for breakfast instead of smoothies. ?Increase fresh fruits and vegetables ?Try not to skip meals ?Avoid bananas and applesauce due to constipation ?Don't do anymore smoothies. ?Keep walking every day for 30 minutes or longer. ? ?Teaching Method Utilized:  ?Visual ?Auditory ?Hands on ? ?Handouts given during visit include: ?Lifestyle nutrition ? ?Barriers to learning/adherence to lifestyle change: none ? ?Demonstrated degree of understanding via:  Teach Back  ? ?Monitoring/Evaluation:  Dietary intake, exercise, , and body weight in 4 month(s). ?Recommend to recheck  A1C and lipids based on improved food choices and exercise. ?

## 2021-11-18 ENCOUNTER — Other Ambulatory Visit: Payer: Self-pay | Admitting: Family Medicine

## 2021-11-19 ENCOUNTER — Other Ambulatory Visit: Payer: Self-pay

## 2021-11-19 DIAGNOSIS — Z87891 Personal history of nicotine dependence: Secondary | ICD-10-CM

## 2021-11-19 DIAGNOSIS — Z122 Encounter for screening for malignant neoplasm of respiratory organs: Secondary | ICD-10-CM

## 2021-11-19 MED ORDER — OXYCODONE HCL 5 MG PO TABS: 5.0000 mg | ORAL_TABLET | Freq: Three times a day (TID) | ORAL | 0 refills | Status: DC | PRN

## 2021-11-19 MED ORDER — MORPHINE SULFATE ER 15 MG PO TBCR: 15.0000 mg | EXTENDED_RELEASE_TABLET | Freq: Two times a day (BID) | ORAL | 0 refills | Status: DC

## 2021-11-19 NOTE — Telephone Encounter (Signed)
Morphin LOV 08/23/21 Last refill 10/29/21, #60, 0 refills  Oxy LOV 08/23/21 Last refill 10/29/21, #90, 0 refills  Please review, thanks!    Please review, thanks!

## 2021-11-20 NOTE — Telephone Encounter (Signed)
Requested Prescriptions  Pending Prescriptions Disp Refills  . dexlansoprazole (DEXILANT) 60 MG capsule [Pharmacy Med Name: DEXLANSOPRAZOLE  '60MG'$   CAP  DELAYED RELEASE] 90 capsule 1    Sig: TAKE 1 CAPSULE BY MOUTH  DAILY     Gastroenterology: Proton Pump Inhibitors Passed - 11/18/2021 10:49 PM      Passed - Valid encounter within last 12 months    Recent Outpatient Visits          2 months ago Pelvic floor dysfunction in female   [place], Cammie Mcgee, MD   4 months ago No-show for appointment   [place] Eulogio Bear, NP   7 months ago Right arm pain   [place] Dennard Schaumann, Cammie Mcgee, MD   8 months ago Lateral epicondylitis of right elbow   [place] Dennard Schaumann, Cammie Mcgee, MD   1 year ago Hypothyroidism, unspecified type   [place] Pickard, Cammie Mcgee, MD      Future Appointments            In 4 months McKenzie, Candee Furbish, MD [place]

## 2021-11-22 DIAGNOSIS — Z936 Other artificial openings of urinary tract status: Secondary | ICD-10-CM | POA: Diagnosis not present

## 2021-11-24 ENCOUNTER — Other Ambulatory Visit: Payer: Self-pay | Admitting: Family Medicine

## 2021-11-27 NOTE — Telephone Encounter (Signed)
Requested medication (s) are due for refill today: yes  Requested medication (s) are on the active medication list: yes  Last refill:  12/26/20 #180/3  Future visit scheduled: no  Notes to clinic:  Unable to refill per protocol due to failed labs, no updated results.    Requested Prescriptions  Pending Prescriptions Disp Refills   furosemide (LASIX) 40 MG tablet [Pharmacy Med Name: Furosemide 40 MG Oral Tablet] 180 tablet 3    Sig: TAKE 1 TABLET BY MOUTH  TWICE DAILY AS NEEDED FOR  EDEMA     Cardiovascular:  Diuretics - Loop Failed - 11/24/2021 10:41 PM      Failed - K in normal range and within 180 days    Potassium  Date Value Ref Range Status  11/30/2020 3.9 3.5 - 5.3 mmol/L Final         Failed - Ca in normal range and within 180 days    Calcium  Date Value Ref Range Status  11/30/2020 9.1 8.6 - 10.4 mg/dL Final   Calcium, Ion  Date Value Ref Range Status  11/22/2015 1.05 (L) 1.13 - 1.30 mmol/L Final         Failed - Na in normal range and within 180 days    Sodium  Date Value Ref Range Status  11/30/2020 138 135 - 146 mmol/L Final  03/30/2020 141 134 - 144 mmol/L Final         Failed - Cr in normal range and within 180 days    Creat  Date Value Ref Range Status  11/30/2020 1.25 (H) 0.50 - 0.99 mg/dL Final    Comment:    For patients >83 years of age, the reference limit for Creatinine is approximately 13% higher for people identified as African-American. .          Failed - Cl in normal range and within 180 days    Chloride  Date Value Ref Range Status  11/30/2020 103 98 - 110 mmol/L Final         Failed - Mg Level in normal range and within 180 days    Magnesium  Date Value Ref Range Status  01/31/2020 0.9 (LL) 1.5 - 2.5 mg/dL Final    Comment:    Verified by repeat analysis. .          Passed - Last BP in normal range    BP Readings from Last 1 Encounters:  08/23/21 124/82         Passed - Valid encounter within last 6 months    Recent  Outpatient Visits           3 months ago Pelvic floor dysfunction in female   [place], Cammie Mcgee, MD   5 months ago No-show for appointment   [place] Eulogio Bear, NP   7 months ago Right arm pain   [place] Denn[place] Schaumann, Cammie Mcgee, MD   8 months ago Lateral epicondylitis of right elbow   [place] Denn[place] Schaumann, Cammie Mcgee, MD   1 year ago Hypothyroidism, unspecified type   [place] Pickard, Cammie Mcgee, MD       Future Appointments             In 4 months McKenzie, Candee Furbish, MD [place]

## 2021-12-10 ENCOUNTER — Other Ambulatory Visit: Payer: Self-pay

## 2021-12-10 ENCOUNTER — Ambulatory Visit (INDEPENDENT_AMBULATORY_CARE_PROVIDER_SITE_OTHER): Payer: Medicare Other | Admitting: Family Medicine

## 2021-12-10 VITALS — BP 118/78 | HR 70 | Temp 97.8°F | Ht 65.0 in | Wt 186.0 lb

## 2021-12-10 DIAGNOSIS — N3090 Cystitis, unspecified without hematuria: Secondary | ICD-10-CM

## 2021-12-10 DIAGNOSIS — J320 Chronic maxillary sinusitis: Secondary | ICD-10-CM | POA: Diagnosis not present

## 2021-12-10 LAB — URINALYSIS, ROUTINE W REFLEX MICROSCOPIC
Bilirubin Urine: NEGATIVE
Glucose, UA: NEGATIVE
Hyaline Cast: NONE SEEN /LPF
Ketones, ur: NEGATIVE
Nitrite: NEGATIVE
Protein, ur: NEGATIVE
Specific Gravity, Urine: 1.015 (ref 1.001–1.035)
pH: 5.5 (ref 5.0–8.0)

## 2021-12-10 LAB — MICROSCOPIC MESSAGE

## 2021-12-10 MED ORDER — FLUTICASONE PROPIONATE 50 MCG/ACT NA SUSP: 2.0000 | Freq: Every day | NASAL | 6 refills | Status: DC

## 2021-12-10 MED ORDER — CEFDINIR 300 MG PO CAPS: 300.0000 mg | ORAL_CAPSULE | Freq: Two times a day (BID) | ORAL | 0 refills | Status: DC

## 2021-12-10 NOTE — Progress Notes (Signed)
Subjective:    Patient ID: [name], female    DOB: 1950/08/09, 71 y.o.   MRN: [id number]  Sinusitis   Patient is a very pleasant 71 year old African-American female with a very complicated past medical history.  She states that in the late 90s around 1998 she had back surgery.  At that time she had a accidental perforation in her intestines during the back surgery that required surgical correction.  Then in 2012 she was found to have bladder cancer.  She underwent surgical treatment of her bladder cancer and suffered a perforation to her intestines as well.  Ultimately she developed a small bowel obstruction due to adhesions and was unable to eat.  She states she lost more than 100 pounds.  She ultimately was admitted to [facility] and underwent TPN.  She required TPN for 3 years from 2012 for 2015 due to inability to tolerate oral food because of bowel obstructions.  Ultimately she underwent surgery in 2015 after she developed a fistula at the stoma of her bladder after her bladder was removed due to bladder cancer.  During the surgery to correct her urostomy, the patient also went lysis of adhesions.  At that point the bowel obstruction resolved and the patient was able to start tolerating oral food again.  She is on Eliquis due to recurrent DVTs in her upper extremities stemming from her long-term use of TPN.  She is on chronic pain medication.  Patient has terrible rectal pain.  She states that it feels like she is being torn in half every time she has a bowel movement.  She had pelvic floor muscular dysfunction due to the severity of the pain and she is working with a specialist in [place].  Prior to working with a specialist for pelvic floor dysfunction, the patient was having 30 bowel movements a day that were searing and tearing with pain.  She has seen numerous specialist who "cannot find anything wrong" however she continues to have horrific pain with defecation.  However the  specialist has been able to reduce her frequency of BMs to 5 or 6 a day which is made her quality of life much better.  She still takes MS Contin twice a day and oxycodone 2-3 times a day due to the severity of pain.  A lot of her pain sounds neuropathic in nature versus also rectal fissures due to the end complete relaxation of the anal sphincter.     12/10/21 I included the past medical history above for my reference.  Patient states that she has been dealing with sinus issues for the last 4 months.  She states that she has frequent thick green drainage coming from her nose and from her sinuses.  She reports pain and pressure in her maxillary sinuses bilaterally.  She denies fever does occasionally have headaches.  She has constant congestion.  She has daily rhinorrhea.  She has tried Claritin, Mucinex, DayQuil with no improvement in her symptoms.  She has not tried nasal steroid spray.  She denies any sneezing or itching or watery eyes.  She denies any chest pain or pleurisy although she does have postnasal drip cough that rattles in her airways at night. Past Medical History:  Diagnosis Date  . Acid reflux   . Bowel obstruction (HCC) several   Recurrent SBO secondary to adhesions  . Cancer [facility]) 2011 [facility])   diagnosed in 2009 per pt. Invasive High grade Urothelial carcinoma- s/p radiation and Chemo   .  Chronic back pain   . DVT (deep venous thrombosis) ([place]) 2015  . Fatty liver   . Gout   . Hyperlipidemia   . Hypertension    pt says taken off medication since has lost weight.  . Hypothyroidism   . Internal hemorrhoids   . Leukopenia 12/06/2011   HIV serology negative  . Malnutrition ([place])   . PE (pulmonary embolism)    PER [facility] records  . Rectal ulcer 2016   [facility]  . UTI (lower urinary tract infection)    Recurrent   Past Surgical History:  Procedure Laterality Date  . ABDOMINAL SURGERY     with intestinal "puncture" x 2, exploratory surgery   . BACK SURGERY      X2  . BLADDER REMOVAL    . CHOLECYSTECTOMY    . CYSTECTOMY     breast  . HERNIA REPAIR     mesh  . ILEO CONDUIT     For bladder cancer  . PORTACATH PLACEMENT     Current Outpatient Medications on File Prior to Visit  Medication Sig Dispense Refill  . apixaban (ELIQUIS) 5 MG TABS tablet Take 1 tablet (5 mg total) by mouth 2 (two) times daily. 180 tablet 3  . Ascorbic Acid (VITAMIN C PO) Take 1 tablet by mouth daily.    Sarajane Marek Sodium (STOOL SOFTENER PLUS PO) Take by mouth.    . colestipol (COLESTID) 1 g tablet Take by mouth.    . Cyanocobalamin 1000 MCG CAPS Take 1 tablet by mouth daily. 90 capsule 3  . dexlansoprazole (DEXILANT) 60 MG capsule TAKE 1 CAPSULE BY MOUTH  DAILY 90 capsule 1  . diclofenac Sodium (VOLTAREN) 1 % GEL APPLY TO AFFECTED AREAS 3 TIMES DAILY AS NEEDED. 100 g 0  . diphenoxylate-atropine (LOMOTIL) 2.5-0.025 MG tablet Take 1 tablet by mouth 4 (four) times daily as needed for diarrhea or loose stools. 120 tablet 2  . docusate sodium (COLACE) 100 MG capsule Take 100 mg by mouth 2 (two) times daily.    . Ergocalciferol (VITAMIN D2) 50 MCG (2000 UT) TABS tAKE 1 Tablet daily 30 tablet   . furosemide (LASIX) 40 MG tablet TAKE 1 TABLET BY MOUTH  TWICE DAILY AS NEEDED FOR  EDEMA 180 tablet 3  . hydrocortisone 2.5 % cream APPLY RECTALLY TWICE DAILY AS NEEDED- please dispense with rectal tip 28.35 g 11  . levothyroxine (SYNTHROID) 150 MCG tablet TAKE 1 TABLET BY MOUTH  DAILY BEFORE BREAKFAST 90 tablet 3  . lidocaine (XYLOCAINE) 5 % ointment APPLY TOPICALLY 4 TIMES  DAILY AS NEEDED 318.96 g 1  . linaclotide (LINZESS) 145 MCG CAPS capsule Take 1 capsule (145 mcg total) by mouth daily before breakfast. 30 capsule 5  . magnesium oxide (MAG-OX) 400 MG tablet Take by mouth.    . Magnesium Oxide 400 (240 Mg) MG TABS Take 1.5 tablet BID 90 tablet 2  . morphine (MS CONTIN) 15 MG 12 hr tablet Take 1 tablet (15 mg total) by mouth every 12 (twelve) hours. 60 tablet 0  .  Multiple Vitamin (MULTI-VITAMIN) tablet Take 2 tablets by mouth daily.    . Multiple Vitamins-Minerals (MULTIVITAMIN ADULT, MINERALS,) TABS Take 1 tablet by mouth daily.    . Multiple Vitamins-Minerals (MULTIVITAMIN PO) Take 1 tablet by mouth daily.    . nortriptyline (PAMELOR) 25 MG capsule Take 1 capsule (25 mg total) by mouth at bedtime. 30 capsule 3  . ondansetron (ZOFRAN-ODT) 8 MG disintegrating tablet DISSOLVE 1 TABLET ON THE  TONGUE EVERY 8 HOURS AS  NEEDED FOR NAUSEA 90 tablet 3  . oxyCODONE (OXY IR/ROXICODONE) 5 MG immediate release tablet Take 1 tablet (5 mg total) by mouth every 8 (eight) hours as needed for severe pain. 90 tablet 0  . potassium chloride SA (KLOR-CON M) 20 MEQ tablet Take 1 tablet (20 mEq total) by mouth daily. 90 tablet 3  . pravastatin (PRAVACHOL) 20 MG tablet TAKE 1 TABLET BY MOUTH  DAILY 90 tablet 3  . pregabalin (LYRICA) 75 MG capsule Take 1 capsule (75 mg total) by mouth 2 (two) times daily. 60 capsule 0  . tiZANidine (ZANAFLEX) 4 MG tablet Take 1 tablet (4 mg total) by mouth every 6 (six) hours as needed for muscle spasms. 30 tablet 0  . XIFAXAN 550 MG TABS tablet Take 550 mg by mouth 3 (three) times daily.     No current facility-administered medications on file prior to visit.   Allergies  Allergen Reactions  . Bactrim [Sulfamethoxazole-Trimethoprim]   . Codeine Hives  . Lactose Diarrhea  . Nitrofuran Derivatives Itching  . Sulfa Antibiotics Hives  . Penicillins Rash    Has patient had a PCN reaction causing immediate rash, facial/tongue/throat swelling, SOB or lightheadedness with hypotension: No Has patient had a PCN reaction causing severe rash involving mucus membranes or skin necrosis: No Has patient had a PCN reaction that required hospitalization: No Has patient had a PCN reaction occurring within the last 10 years: No If all of the above answers are "NO", then may proceed with Cephalosporin use.  Childhood rash - no adult reactions known    Social History   Socioeconomic History  . Marital status: Widowed    Spouse name: Not on file  . Number of children: 2  . Years of education: Not on file  . Highest education level: Not on file  Occupational History    Employer: UNEMPLOYED  Tobacco Use  . Smoking status: Some Days    Packs/day: 0.25    Years: 25.00    Total pack years: 6.25    Types: Cigarettes    Last attempt to quit: 10/03/2016    Years since quitting: 5.1  . Smokeless tobacco: Never  Vaping Use  . Vaping Use: Never used  Substance and Sexual Activity  . Alcohol use: No  . Drug use: No  . Sexual activity: Yes    Birth control/protection: Post-menopausal  Other Topics Concern  . Not on file  Social History Narrative  . Not on file   Social Determinants of Health   Financial Resource Strain: Low Risk  (08/03/2021)   Overall Financial Resource Strain (CARDIA)   . Difficulty of Paying Living Expenses: Not hard at all  Food Insecurity: No Food Insecurity (08/03/2021)   Hunger Vital Sign   . Worried About Charity fundraiser in the Last Year: Never true   . Ran Out of Food in the Last Year: Never true  Transportation Needs: No Transportation Needs (08/03/2021)   PRAPARE - Transportation   . Lack of Transportation (Medical): No   . Lack of Transportation (Non-Medical): No  Physical Activity: Insufficiently Active (08/03/2021)   Exercise Vital Sign   . Days of Exercise per Week: 3 days   . Minutes of Exercise per Session: 20 min  Stress: No Stress Concern Present (08/03/2021)   [place]   . Feeling of Stress : Not at all  Social Connections: Moderately Integrated (08/03/2021)   Social Connection  and Isolation Panel [NHANES]   . Frequency of Communication with Friends and Family: More than three times a week   . Frequency of Social Gatherings with Friends and Family: More than three times a week   . Attends Religious Services: More than 4 times  per year   . Active Member of Clubs or Organizations: Yes   . Attends Archivist Meetings: More than 4 times per year   . Marital Status: Widowed  Intimate Partner Violence: Not At Risk (08/03/2021)   Humiliation, Afraid, Rape, and Kick questionnaire   . Fear of Current or Ex-Partner: No   . Emotionally Abused: No   . Physically Abused: No   . Sexually Abused: No       Review of Systems  All other systems reviewed and are negative.      Objective:   Physical Exam Vitals reviewed.  HENT:     Right Ear: There is impacted cerumen.     Left Ear: There is impacted cerumen.     Nose: Congestion and rhinorrhea present.     Right Sinus: Maxillary sinus tenderness present.     Left Sinus: Maxillary sinus tenderness present.     Mouth/Throat:     Pharynx: No oropharyngeal exudate or posterior oropharyngeal erythema.  Eyes:     General: No scleral icterus.       Right eye: No discharge.        Left eye: No discharge.     Conjunctiva/sclera: Conjunctivae normal.     Pupils: Pupils are equal, round, and reactive to light.  Cardiovascular:     Rate and Rhythm: Normal rate and regular rhythm.     Heart sounds: Normal heart sounds. No murmur heard. Pulmonary:     Effort: Pulmonary effort is normal. No respiratory distress.     Breath sounds: Normal breath sounds. No wheezing or rales.  Chest:     Chest wall: No tenderness.  Neurological:     Mental Status: She is alert.     Motor: No abnormal muscle tone.     Deep Tendon Reflexes: Reflexes are normal and symmetric.        Assessment & Plan:  Chronic maxillary sinusitis I believe the patient has chronic maxillary sinusitis.  Begin Omnicef 300 mg p.o. twice daily for 10 days and use Flonase 2 sprays each nostril daily.  Reassess if no better in 10 days or sooner if worsening.

## 2021-12-12 ENCOUNTER — Telehealth: Payer: Self-pay

## 2021-12-12 NOTE — Telephone Encounter (Signed)
Pt called in stating that she is scheduled to have a colonoscopy at [facility] on 6/21. Pt states that she was asked to get permission from her pcp to stop taking her ELIQUIS for at least 3 to 4 days before this procedure. Pt would like a cb about this please.  Please advise.  Cb#: [phone]

## 2021-12-13 ENCOUNTER — Other Ambulatory Visit: Payer: Self-pay

## 2021-12-13 NOTE — Telephone Encounter (Signed)
Spoke with pt, Per Dr. Dennard Schaumann it was ok for pt to hold [facility] for 2-3 days before procedure.  Pt expressed understanding and nothing at this time.

## 2021-12-17 ENCOUNTER — Ambulatory Visit (INDEPENDENT_AMBULATORY_CARE_PROVIDER_SITE_OTHER): Payer: Medicare Other | Admitting: Acute Care

## 2021-12-17 ENCOUNTER — Encounter: Payer: Self-pay | Admitting: Acute Care

## 2021-12-17 DIAGNOSIS — Z87891 Personal history of nicotine dependence: Secondary | ICD-10-CM | POA: Diagnosis not present

## 2021-12-17 NOTE — Patient Instructions (Signed)
Thank you for participating in the [facility]. It was our pleasure to meet you today. We will call you with the results of your scan within the next few days. Your scan will be assigned a Lung RADS category score by the physicians reading the scans.  This Lung RADS score determines follow up scanning.  See below for description of categories, and follow up screening recommendations. We will be in touch to schedule your follow up screening annually or based on recommendations of our providers. We will fax a copy of your scan results to your Primary Care Physician, or the physician who referred you to the program, to ensure they have the results. Please call the office if you have any questions or concerns regarding your scanning experience or results.  Our office number is [phone]. Please speak with Denise Phelps, RN. , or  Denise Buckner RN, They are  our Lung Cancer Screening RN.'s If They are unavailable when you call, Please leave a message on the voice mail. We will return your call at our earliest convenience.This voice mail is monitored several times a day.  Remember, if your scan is normal, we will scan you annually as long as you continue to meet the criteria for the program. (Age 55-77, Current smoker or smoker who has quit within the last 15 years). If you are a smoker, remember, quitting is the single most powerful action that you can take to decrease your risk of lung cancer and other pulmonary, breathing related problems. We know quitting is hard, and we are here to help.  Please let us know if there is anything we can do to help you meet your goal of quitting. If you are a former smoker, congratulations. We are proud of you! Remain smoke free! Remember you can refer friends or family members through the number above.  We will screen them to make sure they meet criteria for the program. Thank you for helping us take better care of you by  participating in Lung Screening.  You can receive free nicotine replacement therapy ( patches, gum or mints) by calling 1-800-QUIT NOW. Please call so we can get you on the path to becoming  a non-smoker. I know it is hard, but you can do this!  Lung RADS Categories:  Lung RADS 1: no nodules or definitely non-concerning nodules.  Recommendation is for a repeat annual scan in 12 months.  Lung RADS 2:  nodules that are non-concerning in appearance and behavior with a very low likelihood of becoming an active cancer. Recommendation is for a repeat annual scan in 12 months.  Lung RADS 3: nodules that are probably non-concerning , includes nodules with a low likelihood of becoming an active cancer.  Recommendation is for a 6-month repeat screening scan. Often noted after an upper respiratory illness. We will be in touch to make sure you have no questions, and to schedule your 6-month scan.  Lung RADS 4 A: nodules with concerning findings, recommendation is most often for a follow up scan in 3 months or additional testing based on our provider's assessment of the scan. We will be in touch to make sure you have no questions and to schedule the recommended 3 month follow up scan.  Lung RADS 4 B:  indicates findings that are concerning. We will be in touch with you to schedule additional diagnostic testing based on our provider's  assessment of the scan.  Other options for assistance in smoking cessation (   As covered by your insurance benefits)  Hypnosis for smoking cessation  [employer] [phone]  Acupuncture for smoking cessation  [facility] [phone]   

## 2021-12-17 NOTE — Progress Notes (Signed)
Virtual Visit via Telephone Note  I connected with [name] on 05/15/21 at  2:00 PM EST by telephone and verified that I am speaking with the correct person using two identifiers.  Location: Patient: Home Provider: Working    I discussed the limitations, risks, security and privacy concerns of performing an evaluation and management service by telephone and the availability of in person appointments. I also discussed with the patient that there may be a patient responsible charge related to this service. The patient expressed understanding and agreed to proceed.  Shared Decision Making Visit Lung Cancer Screening Program [phone])   Eligibility: Age 71 y.o. Pack Years Smoking History Calculation 30 (# packs/per year x # years smoked) Recent History of coughing up blood  no Unexplained weight loss? no ( >Than 15 pounds within the last 6 months ) Prior History Lung / other cancer no (Diagnosis within the last 5 years already requiring surveillance chest CT Scans). Smoking Status Former Smoker Former Smokers: Years since quit: 5 years  Quit Date: 2018  Visit Components: Discussion included one or more decision making aids. yes Discussion included risk/benefits of screening. yes Discussion included potential follow up diagnostic testing for abnormal scans. yes Discussion included meaning and risk of over diagnosis. yes Discussion included meaning and risk of False Positives. yes Discussion included meaning of total radiation exposure. yes  Counseling Included: Importance of adherence to annual lung cancer LDCT screening. yes Impact of comorbidities on ability to participate in the program. yes Ability and willingness to under diagnostic treatment. yes  Smoking Cessation Counseling: Current Smokers:  Discussed importance of smoking cessation. yes Information about tobacco cessation classes and interventions provided to patient. yes Patient provided with "ticket" for LDCT  Scan. yes Symptomatic Patient. no  Counseling NA Diagnosis Code: Tobacco Use Z72.0 Asymptomatic Patient yes  Counseling NA Former Smokers:  Discussed the importance of maintaining cigarette abstinence. yes Diagnosis Code: Personal History of Nicotine Dependence. J69.678 Information about tobacco cessation classes and interventions provided to patient. Yes Patient provided with "ticket" for LDCT Scan. yes Written Order for Lung Cancer Screening with LDCT placed in Epic. Yes (CT Chest Lung Cancer Screening Low Dose W/O CM) [id number] Z12.2-Screening of respiratory organs Z87.891-Personal history of nicotine dependence   I spent 25 minutes of face to face time with her discussing the risks and benefits of lung cancer screening. We viewed a power point together that explained in detail the above noted topics. We took the time to pause the power point at intervals to allow for questions to be asked and answered to ensure understanding. We discussed that she had taken the single most powerful action possible to decrease her risk of developing lung cancer when she quit smoking. I counseled her to remain smoke free, and to contact me if she ever had the desire to smoke again so that I can provide resources and tools to help support the effort to remain smoke free. We discussed the time and location of the scan, and that either  Doroteo Glassman RN or I will call with the results within  24-48 hours of receiving them. She has my card and contact information in the event she needs to speak with me, in addition to a copy of the power point we reviewed as a resource. She verbalized understanding of all of the above and had no further questions upon leaving the office.     I explained to the patient that there has been a high incidence of coronary  artery disease noted on these exams. I explained that this is a non-gated exam therefore degree or severity cannot be determined. This patient is on statin therapy. I  have asked the patient to follow-up with their PCP regarding any incidental finding of coronary artery disease and management with diet or medication as they feel is clinically indicated. The patient verbalized understanding of the above and had no further questions.   Mohannad Olivero D. Kenton Kingfisher, NP-C [facility] Personal contact information can be found on Amion  12/17/2021, 10:39 AM

## 2021-12-18 ENCOUNTER — Ambulatory Visit (HOSPITAL_COMMUNITY)
Admission: RE | Admit: 2021-12-18 | Discharge: 2021-12-18 | Disposition: A | Discharge status: Home / Self Care | Payer: Medicare Other | Source: Ambulatory Visit | Attending: Family Medicine | Admitting: Family Medicine

## 2021-12-18 DIAGNOSIS — Z122 Encounter for screening for malignant neoplasm of respiratory organs: Secondary | ICD-10-CM | POA: Insufficient documentation

## 2021-12-18 DIAGNOSIS — Z87891 Personal history of nicotine dependence: Secondary | ICD-10-CM | POA: Diagnosis not present

## 2021-12-21 ENCOUNTER — Other Ambulatory Visit: Payer: Self-pay | Admitting: Acute Care

## 2021-12-21 ENCOUNTER — Other Ambulatory Visit: Payer: Self-pay

## 2021-12-21 DIAGNOSIS — Z87891 Personal history of nicotine dependence: Secondary | ICD-10-CM

## 2021-12-21 DIAGNOSIS — Z122 Encounter for screening for malignant neoplasm of respiratory organs: Secondary | ICD-10-CM

## 2021-12-21 MED ORDER — OXYCODONE HCL 5 MG PO TABS: 5.0000 mg | ORAL_TABLET | Freq: Three times a day (TID) | ORAL | 0 refills | Status: DC | PRN

## 2021-12-21 MED ORDER — MORPHINE SULFATE ER 15 MG PO TBCR
15.0000 mg | EXTENDED_RELEASE_TABLET | Freq: Two times a day (BID) | ORAL | 0 refills | Status: DC
Start: 2021-12-21 — End: 2022-01-22

## 2021-12-23 DIAGNOSIS — Z936 Other artificial openings of urinary tract status: Secondary | ICD-10-CM | POA: Diagnosis not present

## 2021-12-25 ENCOUNTER — Telehealth: Payer: Self-pay

## 2021-12-25 NOTE — Telephone Encounter (Signed)
Per pt called to get a   Called spoke with Judeth Cornfield at [facility] regarding pt's over night colostomy bag/pouch. Per Judeth Cornfield it is being deliver to her today.

## 2022-01-17 DIAGNOSIS — Z882 Allergy status to sulfonamides status: Secondary | ICD-10-CM | POA: Diagnosis not present

## 2022-01-17 DIAGNOSIS — Z885 Allergy status to narcotic agent status: Secondary | ICD-10-CM | POA: Diagnosis not present

## 2022-01-17 DIAGNOSIS — I10 Essential (primary) hypertension: Secondary | ICD-10-CM | POA: Diagnosis not present

## 2022-01-17 DIAGNOSIS — Z79899 Other long term (current) drug therapy: Secondary | ICD-10-CM | POA: Diagnosis not present

## 2022-01-17 DIAGNOSIS — Z87891 Personal history of nicotine dependence: Secondary | ICD-10-CM | POA: Diagnosis not present

## 2022-01-17 DIAGNOSIS — K219 Gastro-esophageal reflux disease without esophagitis: Secondary | ICD-10-CM | POA: Diagnosis not present

## 2022-01-17 DIAGNOSIS — Z880 Allergy status to penicillin: Secondary | ICD-10-CM | POA: Diagnosis not present

## 2022-01-17 DIAGNOSIS — Z9049 Acquired absence of other specified parts of digestive tract: Secondary | ICD-10-CM | POA: Diagnosis not present

## 2022-01-17 DIAGNOSIS — E039 Hypothyroidism, unspecified: Secondary | ICD-10-CM | POA: Diagnosis not present

## 2022-01-17 DIAGNOSIS — E785 Hyperlipidemia, unspecified: Secondary | ICD-10-CM | POA: Diagnosis not present

## 2022-01-17 DIAGNOSIS — Z9221 Personal history of antineoplastic chemotherapy: Secondary | ICD-10-CM | POA: Diagnosis not present

## 2022-01-17 DIAGNOSIS — Z041 Encounter for examination and observation following transport accident: Secondary | ICD-10-CM | POA: Diagnosis not present

## 2022-01-18 ENCOUNTER — Inpatient Hospital Stay: Admission: RE | Admit: 2022-01-18 | Payer: Medicare Other | Source: Ambulatory Visit

## 2022-01-22 ENCOUNTER — Other Ambulatory Visit: Payer: Self-pay

## 2022-01-22 ENCOUNTER — Telehealth: Payer: Self-pay

## 2022-01-22 MED ORDER — OXYCODONE HCL 5 MG PO TABS: 5.0000 mg | ORAL_TABLET | Freq: Three times a day (TID) | ORAL | 0 refills | Status: DC | PRN

## 2022-01-22 MED ORDER — MORPHINE SULFATE ER 15 MG PO TBCR
15.0000 mg | EXTENDED_RELEASE_TABLET | Freq: Two times a day (BID) | ORAL | 0 refills | Status: DC
Start: 2022-01-22 — End: 2022-02-21

## 2022-01-22 NOTE — Telephone Encounter (Signed)
Oxycodone LOV 12/10/21 Last refill 12/21/21 #90, 0 refills  Morphin LOV 12/10/21 Last refill 12/21/21, #60, 0 refills  Please review, thanks!    Please review, thanks!

## 2022-01-22 NOTE — Telephone Encounter (Signed)
Pt called stated that she is out of her Lasix, per pharmacy the med is on back order. However, pt [facility] has a generic brand for Lasix, would you recommend this or do you have an alternative?  Please advice

## 2022-01-23 MED ORDER — FUROSEMIDE 40 MG PO TABS: ORAL_TABLET | ORAL | 3 refills | Status: DC

## 2022-01-23 NOTE — Telephone Encounter (Signed)
Pt aware Rx has [facility] to [facility] [address], [place]

## 2022-01-29 DIAGNOSIS — Z936 Other artificial openings of urinary tract status: Secondary | ICD-10-CM | POA: Diagnosis not present

## 2022-02-08 ENCOUNTER — Ambulatory Visit
Admission: EM | Admit: 2022-02-08 | Discharge: 2022-02-08 | Disposition: A | Discharge status: Home / Self Care | Payer: Medicare Other | Attending: Urgent Care | Admitting: Urgent Care

## 2022-02-08 ENCOUNTER — Encounter: Payer: Self-pay | Admitting: Emergency Medicine

## 2022-02-08 ENCOUNTER — Other Ambulatory Visit: Payer: Self-pay

## 2022-02-08 DIAGNOSIS — Z87448 Personal history of other diseases of urinary system: Secondary | ICD-10-CM | POA: Diagnosis not present

## 2022-02-08 DIAGNOSIS — R5381 Other malaise: Secondary | ICD-10-CM | POA: Insufficient documentation

## 2022-02-08 DIAGNOSIS — R5383 Other fatigue: Secondary | ICD-10-CM | POA: Diagnosis not present

## 2022-02-08 DIAGNOSIS — Z8551 Personal history of malignant neoplasm of bladder: Secondary | ICD-10-CM

## 2022-02-08 DIAGNOSIS — Z86711 Personal history of pulmonary embolism: Secondary | ICD-10-CM

## 2022-02-08 DIAGNOSIS — Z9889 Other specified postprocedural states: Secondary | ICD-10-CM | POA: Insufficient documentation

## 2022-02-08 DIAGNOSIS — N3001 Acute cystitis with hematuria: Secondary | ICD-10-CM | POA: Diagnosis not present

## 2022-02-08 DIAGNOSIS — F17200 Nicotine dependence, unspecified, uncomplicated: Secondary | ICD-10-CM | POA: Diagnosis not present

## 2022-02-08 DIAGNOSIS — Z20822 Contact with and (suspected) exposure to covid-19: Secondary | ICD-10-CM | POA: Insufficient documentation

## 2022-02-08 LAB — POCT URINALYSIS DIP (MANUAL ENTRY)
Bilirubin, UA: NEGATIVE
Glucose, UA: NEGATIVE mg/dL
Ketones, POC UA: NEGATIVE mg/dL
Nitrite, UA: POSITIVE — AB
Protein Ur, POC: 30 mg/dL — AB
Spec Grav, UA: 1.015 (ref 1.010–1.025)
Urobilinogen, UA: 0.2 E.U./dL
pH, UA: 7.5 (ref 5.0–8.0)

## 2022-02-08 MED ORDER — CEPHALEXIN 500 MG PO CAPS: 500.0000 mg | ORAL_CAPSULE | Freq: Two times a day (BID) | ORAL | 0 refills | Status: DC

## 2022-02-08 NOTE — ED Triage Notes (Signed)
Pt sts generally weak and feeling poorly x 3 days; pt sts increased stress recently; pt sts has urostomy bag and would like to be checked for UTI and possibly covid; pt sts HA today

## 2022-02-08 NOTE — ED Provider Notes (Signed)
[name]   MRN: [id number] DOB: Nov 21, 1950  Subjective:   [name] is a 71 y.o. female presenting for 3-day history of acute onset malaise and fatigue, feeling worn down.  Patient has been undergoing a lot of stress with her family.  Admits that she has not been hydrating well and has been doing Pepsi and Gatorade consistently.  She does have a history of UTIs and would like to be checked for this.  She has a status of having a urostomy, history of bladder cancer.  She would also like to be checked for COVID.  No active chest pain, shortness of breath, fevers, body aches.  She has had a slight cough but is not very bothersome.  She is smoking again.  She had quit for 4 years but due to her recent stressors started up again.  No current facility-administered medications for this encounter.  Current Outpatient Medications:    apixaban (ELIQUIS) 5 MG TABS tablet, Take 1 tablet (5 mg total) by mouth 2 (two) times daily., Disp: 180 tablet, Rfl: 3   Ascorbic Acid (VITAMIN C PO), Take 1 tablet by mouth daily., Disp: , Rfl:    Casanthranol-Docusate Sodium (STOOL SOFTENER PLUS PO), Take by mouth., Disp: , Rfl:    cefdinir (OMNICEF) 300 MG capsule, Take 1 capsule (300 mg total) by mouth 2 (two) times daily., Disp: 20 capsule, Rfl: 0   colestipol (COLESTID) 1 g tablet, Take by mouth., Disp: , Rfl:    Cyanocobalamin 1000 MCG CAPS, Take 1 tablet by mouth daily., Disp: 90 capsule, Rfl: 3   dexlansoprazole (DEXILANT) 60 MG capsule, TAKE 1 CAPSULE BY MOUTH  DAILY, Disp: 90 capsule, Rfl: 1   diclofenac Sodium (VOLTAREN) 1 % GEL, APPLY TO AFFECTED AREAS 3 TIMES DAILY AS NEEDED., Disp: 100 g, Rfl: 0   diphenoxylate-atropine (LOMOTIL) 2.5-0.025 MG tablet, Take 1 tablet by mouth 4 (four) times daily as needed for diarrhea or loose stools., Disp: 120 tablet, Rfl: 2   docusate sodium (COLACE) 100 MG capsule, Take 100 mg by mouth 2 (two) times daily., Disp: , Rfl:    Ergocalciferol  (VITAMIN D2) 50 MCG (2000 UT) TABS, tAKE 1 Tablet daily, Disp: 30 tablet, Rfl:    fluticasone (FLONASE) 50 MCG/ACT nasal spray, Place 2 sprays into both nostrils daily., Disp: 16 g, Rfl: 6   furosemide (LASIX) 40 MG tablet, TAKE 1 TABLET BY MOUTH  TWICE DAILY AS NEEDED FOR  EDEMA, Disp: 180 tablet, Rfl: 3   hydrocortisone 2.5 % cream, APPLY RECTALLY TWICE DAILY AS NEEDED- please dispense with rectal tip, Disp: 28.35 g, Rfl: 11   levothyroxine (SYNTHROID) 150 MCG tablet, TAKE 1 TABLET BY MOUTH  DAILY BEFORE BREAKFAST, Disp: 90 tablet, Rfl: 3   lidocaine (XYLOCAINE) 5 % ointment, APPLY TOPICALLY 4 TIMES  DAILY AS NEEDED, Disp: 318.96 g, Rfl: 1   linaclotide (LINZESS) 145 MCG CAPS capsule, Take 1 capsule (145 mcg total) by mouth daily before breakfast., Disp: 30 capsule, Rfl: 5   magnesium oxide (MAG-OX) 400 MG tablet, Take by mouth., Disp: , Rfl:    Magnesium Oxide 400 (240 Mg) MG TABS, Take 1.5 tablet BID, Disp: 90 tablet, Rfl: 2   morphine (MS CONTIN) 15 MG 12 hr tablet, Take 1 tablet (15 mg total) by mouth every 12 (twelve) hours., Disp: 60 tablet, Rfl: 0   Multiple Vitamin (MULTI-VITAMIN) tablet, Take 2 tablets by mouth daily., Disp: , Rfl:    Multiple Vitamins-Minerals (MULTIVITAMIN ADULT, MINERALS,) TABS, Take 1 tablet  by mouth daily., Disp: , Rfl:    Multiple Vitamins-Minerals (MULTIVITAMIN PO), Take 1 tablet by mouth daily., Disp: , Rfl:    nortriptyline (PAMELOR) 25 MG capsule, Take 1 capsule (25 mg total) by mouth at bedtime., Disp: 30 capsule, Rfl: 3   ondansetron (ZOFRAN-ODT) 8 MG disintegrating tablet, DISSOLVE 1 TABLET ON THE  TONGUE EVERY 8 HOURS AS  NEEDED FOR NAUSEA, Disp: 90 tablet, Rfl: 3   oxyCODONE (OXY IR/ROXICODONE) 5 MG immediate release tablet, Take 1 tablet (5 mg total) by mouth every 8 (eight) hours as needed for severe pain., Disp: 90 tablet, Rfl: 0   potassium chloride SA (KLOR-CON M) 20 MEQ tablet, Take 1 tablet (20 mEq total) by mouth daily., Disp: 90 tablet, Rfl: 3    pravastatin (PRAVACHOL) 20 MG tablet, TAKE 1 TABLET BY MOUTH  DAILY, Disp: 90 tablet, Rfl: 3   pregabalin (LYRICA) 75 MG capsule, Take 1 capsule (75 mg total) by mouth 2 (two) times daily., Disp: 60 capsule, Rfl: 0   tiZANidine (ZANAFLEX) 4 MG tablet, Take 1 tablet (4 mg total) by mouth every 6 (six) hours as needed for muscle spasms., Disp: 30 tablet, Rfl: 0   XIFAXAN 550 MG TABS tablet, Take 550 mg by mouth 3 (three) times daily., Disp: , Rfl:    Allergies  Allergen Reactions   Bactrim [Sulfamethoxazole-Trimethoprim]    Codeine Hives   Lactose Diarrhea   Nitrofuran Derivatives Itching   Sulfa Antibiotics Hives   Penicillins Rash    Has patient had a PCN reaction causing immediate rash, facial/tongue/throat swelling, SOB or lightheadedness with hypotension: No Has patient had a PCN reaction causing severe rash involving mucus membranes or skin necrosis: No Has patient had a PCN reaction that required hospitalization: No Has patient had a PCN reaction occurring within the last 10 years: No If all of the above answers are "NO", then may proceed with Cephalosporin use.  Childhood rash - no adult reactions known    Past Medical History:  Diagnosis Date   Acid reflux    Bowel obstruction ([place]) several   Recurrent SBO secondary to adhesions   [facility]cer [facility]) 2011 [facility])   diagnosed in 2009 per pt. Invasive High grade Urothelial carcinoma- s/p radiation and Chemo    Chronic back pain    DVT (deep venous thrombosis) ([place]) 2015   Fatty liver    Gout    Hyperlipidemia    Hypertension    pt says taken off medication since has lost weight.   Hypothyroidism    Internal hemorrhoids    Leukopenia 12/06/2011   HIV serology negative   Malnutrition ([place])    PE (pulmonary embolism)    PER [facility] records   Rectal ulcer 2016   [facility]   UTI (lower urinary tract infection)    Recurrent     Past Surgical History:  Procedure Laterality Date   ABDOMINAL SURGERY     with  intestinal "puncture" x 2, exploratory surgery    BACK SURGERY     X2   BLADDER REMOVAL     CHOLECYSTECTOMY     CYSTECTOMY     breast   HERNIA REPAIR     mesh   ILEO CONDUIT     For bladder cancer   PORTACATH PLACEMENT      Family History  Problem Relation Age of Onset   Heart disease Mother        enlarged   Hypertension Mother    Stroke Mother  Diabetes Father    Diabetes Sister    Diabetes Maternal Grandmother    Colon cancer Neg Hx     Social History   Tobacco Use   Smoking status: Former    Packs/day: 0.60    Years: 48.00    Total pack years: 28.80    Types: Cigarettes    Quit date: 10/03/2016    Years since quitting: 5.3   Smokeless tobacco: Never  Vaping Use   Vaping Use: Never used  Substance Use Topics   Alcohol use: No   Drug use: No    ROS   Objective:   Vitals: BP (!) 142/83 (BP Location: Right Arm)   Pulse 85   Temp 98.2 F (36.8 C) (Oral)   Resp 18   SpO2 95%   Physical Exam Constitutional:      General: She is not in acute distress.    Appearance: Normal appearance. She is well-developed. She is not ill-appearing, toxic-appearing or diaphoretic.  HENT:     Head: Normocephalic and atraumatic.     Nose: Nose normal.     Mouth/Throat:     Mouth: Mucous membranes are moist.  Eyes:     General: No scleral icterus.       Right eye: No discharge.        Left eye: No discharge.     Extraocular Movements: Extraocular movements intact.     Conjunctiva/sclera: Conjunctivae normal.  Cardiovascular:     Rate and Rhythm: Normal rate and regular rhythm.     Heart sounds: Normal heart sounds. No murmur heard.    No friction rub. No gallop.  Pulmonary:     Effort: Pulmonary effort is normal. No respiratory distress.     Breath sounds: No stridor. No wheezing, rhonchi or rales.  Chest:     Chest wall: No tenderness.  Abdominal:     General: Bowel sounds are normal. There is no distension.     Palpations: Abdomen is soft. There is no  mass.     Tenderness: There is no abdominal tenderness. There is no right CVA tenderness, left CVA tenderness, guarding or rebound.  Skin:    General: Skin is warm and dry.  Neurological:     General: No focal deficit present.     Mental Status: She is alert and oriented to person, place, and time.  Psychiatric:        Mood and Affect: Mood normal.        Behavior: Behavior normal.        Thought Content: Thought content normal.        Judgment: Judgment normal.    Results for orders placed or performed during the hospital encounter of 02/08/22 (from the past 24 hour(s))  POCT urinalysis dipstick     Status: Abnormal   Collection Time: 02/08/22  7:52 PM  Result Value Ref Range   Color, UA yellow yellow   Clarity, UA clear clear   Glucose, UA negative negative mg/dL   Bilirubin, UA negative negative   Ketones, POC UA negative negative mg/dL   Spec Grav, UA 1.015 1.010 - 1.025   Blood, UA trace-lysed (A) negative   pH, UA 7.5 5.0 - 8.0   Protein Ur, POC =30 (A) negative mg/dL   Urobilinogen, UA 0.2 0.2 or 1.0 E.U./dL   Nitrite, UA Positive (A) Negative   Leukocytes, UA Small (1+) (A) Negative     Assessment and Plan :   PDMP not reviewed this encounter.  1. Acute cystitis with hematuria   2. Malaise and fatigue   3. History of urostomy   4. History of pulmonary embolism   5. History of chronic cystitis   6. History of bladder cancer    Deferred imaging given clear cardiopulmonary exam, hemodynamically stable vital signs. CrCl calculate at 45m/min from last creatinine on 07/13/2021. Start Keflex to cover for acute cystitis, urine culture pending.  Recommended aggressive hydration, limiting urinary irritants. COVID testing pending.  You supportive care otherwise.  Counseled patient on potential for adverse effects with medications prescribed/recommended today, ER and return-to-clinic precautions discussed, patient verbalized understanding.    MJaynee Eagles PA-C 02/08/22  2000

## 2022-02-08 NOTE — Discharge Instructions (Signed)
Please start Keflex to address an urinary tract infection. Make sure you hydrate very well with plain water and a quantity of 64 ounces of water a day.  Please limit drinks that are considered urinary irritants such as soda, sweet tea, coffee, energy drinks, alcohol.  These can worsen your urinary and genital symptoms but also be the source of them.  I will let you know about your urine culture results through MyChart to see if we need to prescribe or change your antibiotics based off of those results.  We will call on Monday about your COVID test results.

## 2022-02-10 LAB — SARS CORONAVIRUS 2 (TAT 6-24 HRS): SARS Coronavirus 2: NEGATIVE

## 2022-02-13 LAB — URINE CULTURE: Culture: >=100000 — AB

## 2022-02-19 ENCOUNTER — Ambulatory Visit (INDEPENDENT_AMBULATORY_CARE_PROVIDER_SITE_OTHER): Payer: Medicare Other | Admitting: Family Medicine

## 2022-02-19 VITALS — BP 124/72 | HR 87 | Temp 98.5°F | Ht 65.0 in | Wt 181.8 lb

## 2022-02-19 DIAGNOSIS — G4700 Insomnia, unspecified: Secondary | ICD-10-CM | POA: Diagnosis not present

## 2022-02-19 DIAGNOSIS — I872 Venous insufficiency (chronic) (peripheral): Secondary | ICD-10-CM | POA: Diagnosis not present

## 2022-02-19 MED ORDER — TRAZODONE HCL 50 MG PO TABS: 50.0000 mg | ORAL_TABLET | Freq: Every day | ORAL | 1 refills | Status: DC

## 2022-02-19 NOTE — Progress Notes (Signed)
Subjective:    Patient ID: [name], female    DOB: 1951-06-21, 71 y.o.   MRN: [id number]  Insomnia PMH includes: depression.   Depression        Associated symptoms include insomnia.   Patient presents today with 2 concerns.  First she reports swelling in both legs.  She has trace pitting edema in both feet and trace pitting edema in both anterior shins.  She has numerous large varicose veins and spider veins in both legs distal to the knees.  She takes Lasix for leg swelling.  She denies any pain in either leg.  She has full range of motion in the right ankle and left ankle and right knee and left knee without pain.  There is no erythema or warmth.  She denies any chest pain, pleurisy, shortness of breath, orthopnea, or paroxysmal nocturnal dyspnea.  I believe that this is leg swelling due to to chronic venous insufficiency.  Second concern is insomnia.  Recently her brother died.  Another brother lost his home in a fire.  The patient recently lost her car.  Her son recently got divorced from his wife.  She is also helping to care for her grandchild.  Therefore she is under tremendous stress.  I asked the patient if she feels depressed and she states that she is not depressed but instead that she lies in bed unable to sleep.  Her mind keeps racing at night.  She is worrying about helping everyone and feels like she is unable to.  She denies any anxiety.  She denies any suicidal thoughts. Past Medical History:  Diagnosis Date   Acid reflux    Bowel obstruction ([place]) several   Recurrent SBO secondary to adhesions   Cancer [facility]) 2011 [facility])   diagnosed in 2009 per pt. Invasive High grade Urothelial carcinoma- s/p radiation and Chemo    Chronic back pain    DVT (deep venous thrombosis) ([place]) 2015   Fatty liver    Gout    Hyperlipidemia    Hypertension    pt says taken off medication since has lost weight.   Hypothyroidism    Internal hemorrhoids    Leukopenia 12/06/2011    HIV serology negative   Malnutrition ([place])    PE (pulmonary embolism)    [facility]   Rectal ulcer 2016   [facility]   UTI (lower urinary tract infection)    Recurrent   Past Surgical History:  Procedure Laterality Date   ABDOMINAL SURGERY     with intestinal "puncture" x 2, exploratory surgery    BACK SURGERY     X2   BLADDER REMOVAL     CHOLECYSTECTOMY     CYSTECTOMY     breast   HERNIA REPAIR     mesh   ILEO CONDUIT     For bladder cancer   PORTACATH PLACEMENT     Current Outpatient Medications on File Prior to Visit  Medication Sig Dispense Refill   apixaban (ELIQUIS) 5 MG TABS tablet Take 1 tablet (5 mg total) by mouth 2 (two) times daily. 180 tablet 3   Ascorbic Acid (VITAMIN C PO) Take 1 tablet by mouth daily.     Casanthranol-Docusate Sodium (STOOL SOFTENER PLUS PO) Take by mouth.     cefdinir (OMNICEF) 300 MG capsule Take 1 capsule (300 mg total) by mouth 2 (two) times daily. 20 capsule 0   colestipol (COLESTID) 1 g tablet Take by mouth.  Cyanocobalamin 1000 MCG CAPS Take 1 tablet by mouth daily. 90 capsule 3   dexlansoprazole (DEXILANT) 60 MG capsule TAKE 1 CAPSULE BY MOUTH  DAILY 90 capsule 1   diclofenac Sodium (VOLTAREN) 1 % GEL APPLY TO AFFECTED AREAS 3 TIMES DAILY AS NEEDED. 100 g 0   diphenoxylate-atropine (LOMOTIL) 2.5-0.025 MG tablet Take 1 tablet by mouth 4 (four) times daily as needed for diarrhea or loose stools. 120 tablet 2   docusate sodium (COLACE) 100 MG capsule Take 100 mg by mouth 2 (two) times daily.     Ergocalciferol (VITAMIN D2) 50 MCG (2000 UT) TABS tAKE 1 Tablet daily 30 tablet    fluticasone (FLONASE) 50 MCG/ACT nasal spray Place 2 sprays into both nostrils daily. 16 g 6   furosemide (LASIX) 40 MG tablet TAKE 1 TABLET BY MOUTH  TWICE DAILY AS NEEDED FOR  EDEMA 180 tablet 3   hydrocortisone 2.5 % cream APPLY RECTALLY TWICE DAILY AS NEEDED- please dispense with rectal tip 28.35 g 11   levothyroxine (SYNTHROID) 150 MCG tablet  TAKE 1 TABLET BY MOUTH  DAILY BEFORE BREAKFAST 90 tablet 3   lidocaine (XYLOCAINE) 5 % ointment APPLY TOPICALLY 4 TIMES  DAILY AS NEEDED 318.96 g 1   linaclotide (LINZESS) 145 MCG CAPS capsule Take 1 capsule (145 mcg total) by mouth daily before breakfast. 30 capsule 5   magnesium oxide (MAG-OX) 400 MG tablet Take by mouth.     Magnesium Oxide 400 (240 Mg) MG TABS Take 1.5 tablet BID 90 tablet 2   morphine (MS CONTIN) 15 MG 12 hr tablet Take 1 tablet (15 mg total) by mouth every 12 (twelve) hours. 60 tablet 0   Multiple Vitamin (MULTI-VITAMIN) tablet Take 2 tablets by mouth daily.     Multiple Vitamins-Minerals (MULTIVITAMIN ADULT, MINERALS,) TABS Take 1 tablet by mouth daily.     ondansetron (ZOFRAN-ODT) 8 MG disintegrating tablet DISSOLVE 1 TABLET ON THE  TONGUE EVERY 8 HOURS AS  NEEDED FOR NAUSEA 90 tablet 3   oxyCODONE (OXY IR/ROXICODONE) 5 MG immediate release tablet Take 1 tablet (5 mg total) by mouth every 8 (eight) hours as needed for severe pain. 90 tablet 0   potassium chloride SA (KLOR-CON M) 20 MEQ tablet Take 1 tablet (20 mEq total) by mouth daily. 90 tablet 3   pravastatin (PRAVACHOL) 20 MG tablet TAKE 1 TABLET BY MOUTH  DAILY 90 tablet 3   pregabalin (LYRICA) 75 MG capsule Take 1 capsule (75 mg total) by mouth 2 (two) times daily. 60 capsule 0   tiZANidine (ZANAFLEX) 4 MG tablet Take 1 tablet (4 mg total) by mouth every 6 (six) hours as needed for muscle spasms. 30 tablet 0   cephALEXin (KEFLEX) 500 MG capsule Take 1 capsule (500 mg total) by mouth 2 (two) times daily. (Patient not taking: Reported on 02/19/2022) 10 capsule 0   Multiple Vitamins-Minerals (MULTIVITAMIN PO) Take 1 tablet by mouth daily. (Patient not taking: Reported on 02/19/2022)     nortriptyline (PAMELOR) 25 MG capsule Take 1 capsule (25 mg total) by mouth at bedtime. (Patient not taking: Reported on 02/19/2022) 30 capsule 3   XIFAXAN 550 MG TABS tablet Take 550 mg by mouth 3 (three) times daily. (Patient not taking:  Reported on 02/19/2022)     No current facility-administered medications on file prior to visit.   Allergies  Allergen Reactions   Bactrim [Sulfamethoxazole-Trimethoprim]    Codeine Hives   Lactose Diarrhea   Nitrofuran Derivatives Itching   Sulfa Antibiotics Hives  Penicillins Rash    Has patient had a PCN reaction causing immediate rash, facial/tongue/throat swelling, SOB or lightheadedness with hypotension: No Has patient had a PCN reaction causing severe rash involving mucus membranes or skin necrosis: No Has patient had a PCN reaction that required hospitalization: No Has patient had a PCN reaction occurring within the last 10 years: No If all of the above answers are "NO", then may proceed with Cephalosporin use.  Childhood rash - no adult reactions known   Social History   Socioeconomic History   Marital status: Widowed    Spouse name: Not on file   Number of children: 2   Years of education: Not on file   Highest education level: Not on file  Occupational History    Employer: UNEMPLOYED  Tobacco Use   Smoking status: Former    Packs/day: 0.60    Years: 48.00    Total pack years: 28.80    Types: Cigarettes    Quit date: 10/03/2016    Years since quitting: 5.3   Smokeless tobacco: Never  Vaping Use   Vaping Use: Never used  Substance and Sexual Activity   Alcohol use: No   Drug use: No   Sexual activity: Yes    Birth control/protection: Post-menopausal  Other Topics Concern   Not on file  Social History Narrative   Not on file   Social Determinants of Health   Financial Resource Strain: Low Risk  (08/03/2021)   Overall Financial Resource Strain (CARDIA)    Difficulty of Paying Living Expenses: Not hard at all  Food Insecurity: No Food Insecurity (08/03/2021)   Hunger Vital Sign    Worried About Running Out of Food in the Last Year: Never true    [place] in the Last Year: Never true  Transportation Needs: No Transportation Needs (08/03/2021)   PRAPARE  - Hydrologist (Medical): No    Lack of Transportation (Non-Medical): No  Physical Activity: Insufficiently Active (08/03/2021)   Exercise Vital Sign    Days of Exercise per Week: 3 days    Minutes of Exercise per Session: 20 min  Stress: No Stress Concern Present (08/03/2021)   [place]    Feeling of Stress : Not at all  Social Connections: Moderately Integrated (08/03/2021)   Social Connection and Isolation Panel [NHANES]    Frequency of Communication with Friends and Family: More than three times a week    Frequency of Social Gatherings with Friends and Family: More than three times a week    Attends Religious Services: More than 4 times per year    Active Member of [employer] or Organizations: Yes    Attends Archivist Meetings: More than 4 times per year    Marital Status: Widowed  Intimate Partner Violence: Not At Risk (08/03/2021)   Humiliation, Afraid, Rape, and Kick questionnaire    Fear of Current or Ex-Partner: No    Emotionally Abused: No    Physically Abused: No    Sexually Abused: No       Review of Systems  Psychiatric/Behavioral:  Positive for depression. The patient has insomnia.   All other systems reviewed and are negative.      Objective:   Physical Exam Vitals reviewed.  Eyes:     General: No scleral icterus.       Right eye: No discharge.        Left eye: No discharge.  Conjunctiva/sclera: Conjunctivae normal.     Pupils: Pupils are equal, round, and reactive to light.  Cardiovascular:     Rate and Rhythm: Normal rate and regular rhythm.     Heart sounds: Normal heart sounds. No murmur heard. Pulmonary:     Effort: Pulmonary effort is normal. No respiratory distress.     Breath sounds: Normal breath sounds. No wheezing or rales.  Chest:     Chest wall: No tenderness.  Abdominal:     General: Bowel sounds are normal. There is no distension.      Palpations: Abdomen is soft. There is no mass.     Tenderness: There is no abdominal tenderness. There is no guarding or rebound.  Skin:    General: Skin is warm.     Findings: No erythema or rash.  Neurological:     Mental Status: She is alert and oriented to person, place, and time.     Cranial Nerves: No cranial nerve deficit.     Motor: No abnormal muscle tone.     Coordination: Coordination normal.     Deep Tendon Reflexes: Reflexes are normal and symmetric.         Assessment & Plan:  Chronic venous insufficiency  Insomnia, unspecified type I explained to the patient that I see no sign of heart failure, kidney failure, or blood clots to explain the leg swelling.  I feel that is most likely due to chronic venous insufficiency.  The swelling is minimal so I would not increase the Lasix but I did recommend thigh-high compression hose.  Second I believe her insomnia is due to stress due to all the issues mentioned above.  I would avoid benzos or hypnotics due to her chronic pain medication but instead I will try trazodone 50 mg p.o. nightly as needed

## 2022-02-21 ENCOUNTER — Other Ambulatory Visit: Payer: Self-pay

## 2022-02-21 MED ORDER — MORPHINE SULFATE ER 15 MG PO TBCR: 15.0000 mg | EXTENDED_RELEASE_TABLET | Freq: Two times a day (BID) | ORAL | 0 refills | Status: DC

## 2022-02-21 MED ORDER — OXYCODONE HCL 5 MG PO TABS
5.0000 mg | ORAL_TABLET | Freq: Three times a day (TID) | ORAL | 0 refills | Status: DC | PRN
Start: 2022-02-21 — End: 2022-03-21

## 2022-02-21 NOTE — Telephone Encounter (Signed)
LOV 02/19/22 Last refill 01/22/22, #60, 0 refills  Morphin LOV 02/19/22 Last refill 01/22/22, #90, 0 refills  Please review, thanks!   Please review, thanks!

## 2022-02-24 ENCOUNTER — Other Ambulatory Visit: Payer: Self-pay | Admitting: Family Medicine

## 2022-02-25 NOTE — Telephone Encounter (Signed)
Requested Prescriptions  Pending Prescriptions Disp Refills  . dexlansoprazole (DEXILANT) 60 MG capsule [Pharmacy Med Name: DEXLANSOPRAZOLE  '60MG'$   CAP  DELAYED RELEASE] 100 capsule 1    Sig: TAKE 1 CAPSULE BY MOUTH DAILY     Gastroenterology: Proton Pump Inhibitors Passed - 02/24/2022 10:45 PM      Passed - Valid encounter within last 12 months    Recent Outpatient Visits          6 months ago Pelvic floor dysfunction in female   [place], Cammie Mcgee, MD   8 months ago No-show for appointment   [place] Eulogio Bear, NP   10 months ago Right arm pain   [place] [facility], Cammie Mcgee, MD   11 months ago Lateral epicondylitis of right elbow   [place] [facility], Cammie Mcgee, MD   1 year ago Hypothyroidism, unspecified type   [place] [facility], Cammie Mcgee, MD      Future Appointments            In 1 month McKenzie, Candee Furbish, MD [place]

## 2022-03-08 DIAGNOSIS — Z936 Other artificial openings of urinary tract status: Secondary | ICD-10-CM | POA: Diagnosis not present

## 2022-03-12 DIAGNOSIS — K6289 Other specified diseases of anus and rectum: Secondary | ICD-10-CM | POA: Diagnosis not present

## 2022-03-12 DIAGNOSIS — K5900 Constipation, unspecified: Secondary | ICD-10-CM | POA: Diagnosis not present

## 2022-03-12 DIAGNOSIS — M6289 Other specified disorders of muscle: Secondary | ICD-10-CM | POA: Diagnosis not present

## 2022-03-12 DIAGNOSIS — R198 Other specified symptoms and signs involving the digestive system and abdomen: Secondary | ICD-10-CM | POA: Diagnosis not present

## 2022-03-20 ENCOUNTER — Ambulatory Visit: Payer: Medicare Other | Admitting: Nutrition

## 2022-03-21 ENCOUNTER — Other Ambulatory Visit: Payer: Self-pay

## 2022-03-22 MED ORDER — OXYCODONE HCL 5 MG PO TABS
5.0000 mg | ORAL_TABLET | Freq: Three times a day (TID) | ORAL | 0 refills | Status: DC | PRN
Start: 2022-03-22 — End: 2022-04-26

## 2022-03-22 MED ORDER — MORPHINE SULFATE ER 15 MG PO TBCR: 15.0000 mg | EXTENDED_RELEASE_TABLET | Freq: Two times a day (BID) | ORAL | 0 refills | Status: DC

## 2022-03-23 ENCOUNTER — Other Ambulatory Visit: Payer: Self-pay | Admitting: Family Medicine

## 2022-03-25 NOTE — Telephone Encounter (Signed)
Requested medication (s) are due for refill today: yes (mail order)  Requested medication (s) are on the active medication list: yes  Last refill:  Eliquis 07/10/21 #180 with 3 RF, Potassium 06/27/2021 #90 with 3 RF  Future visit scheduled: no, seen 02/19/22  Notes to clinic:  Failed protocol of labs within 12 months, labs from 11/30/2020, no upcoming appt, see 02/19/22, please assess.       Requested Prescriptions  Pending Prescriptions Disp Refills   potassium chloride SA (KLOR-CON M) 20 MEQ tablet [Pharmacy Med Name: Potassium Chloride Crys ER 20 MEQ Oral Tablet Extended Release] 100 tablet 2    Sig: TAKE 1 TABLET BY MOUTH DAILY     Endocrinology:  Minerals - Potassium Supplementation Failed - 03/23/2022 11:03 PM      Failed - K in normal range and within 360 days    Potassium  Date Value Ref Range Status  11/30/2020 3.9 3.5 - 5.3 mmol/L Final         Failed - Cr in normal range and within 360 days    Creat  Date Value Ref Range Status  11/30/2020 1.25 (H) 0.50 - 0.99 mg/dL Final    Comment:    For patients >23 years of age, the reference limit for Creatinine is approximately 13% higher for people identified as African-American. Renella Cunas - Valid encounter within last 12 months    Recent Outpatient Visits           7 months ago Pelvic floor dysfunction in female   [place], Cammie Mcgee, MD   9 months ago No-show for appointment   [place] Eulogio Bear, NP   11 months ago Right arm pain   [place] [facility] Schaumann, Cammie Mcgee, MD   1 year ago Lateral epicondylitis of right elbow   [place] Susy Frizzle, MD   1 year ago Hypothyroidism, unspecified type   [place] [facility], Cammie Mcgee, MD       Future Appointments             In 2 days McKenzie, Candee Furbish, MD [facility] [place]             ELIQUIS 5 MG TABS tablet [Pharmacy Med Name:  Eliquis 5 MG Oral Tablet] 200 tablet 2    Sig: TAKE 1 TABLET BY MOUTH TWICE  DAILY     Hematology:  Anticoagulants - apixaban Failed - 03/23/2022 11:03 PM      Failed - PLT in normal range and within 360 days    Platelets  Date Value Ref Range Status  11/30/2020 339 140 - 400 Thousand/uL Final         Failed - HGB in normal range and within 360 days    Hemoglobin  Date Value Ref Range Status  11/30/2020 12.4 11.7 - 15.5 g/dL Final         Failed - HCT in normal range and within 360 days    HCT  Date Value Ref Range Status  11/30/2020 38.8 35.0 - 45.0 % Final         Failed - Cr in normal range and within 360 days    Creat  Date Value Ref Range Status  11/30/2020 1.25 (H) 0.50 - 0.99 mg/dL Final    Comment:    For patients >27 years of age, the reference limit for Creatinine  is approximately 13% higher for people identified as African-American. .          Failed - AST in normal range and within 360 days    AST  Date Value Ref Range Status  11/30/2020 13 10 - 35 U/L Final         Failed - ALT in normal range and within 360 days    ALT  Date Value Ref Range Status  11/30/2020 13 6 - 29 U/L Final         Passed - Valid encounter within last 12 months    Recent Outpatient Visits           7 months ago Pelvic floor dysfunction in female   [place], Cammie Mcgee, MD   9 months ago No-show for appointment   [place] Eulogio Bear, NP   11 months ago Right arm pain   [facility] Schaumann, Cammie Mcgee, MD   1 year ago Lateral epicondylitis of right elbow   [place] [facility] Cammie Mcgee, MD   1 year ago Hypothyroidism, unspecified type   [place] Pickard, Cammie Mcgee, MD       Future Appointments             In 2 days McKenzie, Candee Furbish, MD [place]

## 2022-03-27 ENCOUNTER — Ambulatory Visit (INDEPENDENT_AMBULATORY_CARE_PROVIDER_SITE_OTHER): Payer: Medicare Other | Admitting: Urology

## 2022-03-27 ENCOUNTER — Encounter: Payer: Self-pay | Admitting: Urology

## 2022-03-27 VITALS — BP 113/72 | HR 82

## 2022-03-27 DIAGNOSIS — C679 Malignant neoplasm of bladder, unspecified: Secondary | ICD-10-CM | POA: Diagnosis not present

## 2022-03-27 LAB — MICROSCOPIC EXAMINATION: WBC, UA: >30 /hpf — AB (ref 0–5)

## 2022-03-27 LAB — URINALYSIS, ROUTINE W REFLEX MICROSCOPIC
Bilirubin, UA: NEGATIVE
Glucose, UA: NEGATIVE
Nitrite, UA: NEGATIVE
Specific Gravity, UA: 1.015 (ref 1.005–1.030)
Urobilinogen, Ur: 0.2 mg/dL (ref 0.2–1.0)
pH, UA: 6 (ref 5.0–7.5)

## 2022-03-27 NOTE — Progress Notes (Signed)
03/27/2022 2:15 PM   [name] 1950/10/27 [id number]  Referring provider: Susy Frizzle, MD [address] [place],  [place] [address]  Followup bladder cancer   HPI: Ms Trefz is a 71yo here for followup for bladder cancer. She met with wound ostomy. Renal US is scheduled for tomorrow. She had a UTI last month.    PMH: Past Medical History:  Diagnosis Date   Acid reflux    Bowel obstruction ([place]) several   Recurrent SBO secondary to adhesions   Cancer [facility]) 2011 [facility])   diagnosed in 2009 per pt. Invasive High grade Urothelial carcinoma- s/p radiation and Chemo    Chronic back pain    DVT (deep venous thrombosis) ([place]) 2015   Fatty liver    Gout    Hyperlipidemia    Hypertension    pt says taken off medication since has lost weight.   Hypothyroidism    Internal hemorrhoids    Leukopenia 12/06/2011   HIV serology negative   Malnutrition ([place])    PE (pulmonary embolism)    [facility]   Rectal ulcer 2016   [facility]   UTI (lower urinary tract infection)    Recurrent    Surgical History: Past Surgical History:  Procedure Laterality Date   ABDOMINAL SURGERY     with intestinal "puncture" x 2, exploratory surgery    BACK SURGERY     X2   BLADDER REMOVAL     CHOLECYSTECTOMY     CYSTECTOMY     breast   HERNIA REPAIR     mesh   ILEO CONDUIT     For bladder cancer   PORTACATH PLACEMENT      Home Medications:  Allergies as of 03/27/2022       Reactions   Bactrim [sulfamethoxazole-trimethoprim]    Codeine Hives   Lactose Diarrhea   Nitrofuran Derivatives Itching   Sulfa Antibiotics Hives   Penicillins Rash   Has patient had a PCN reaction causing immediate rash, facial/tongue/throat swelling, SOB or lightheadedness with hypotension: No Has patient had a PCN reaction causing severe rash involving mucus membranes or skin necrosis: No Has patient had a PCN reaction that required hospitalization: No Has patient had a  PCN reaction occurring within the last 10 years: No If all of the above answers are "NO", then may proceed with Cephalosporin use. Childhood rash - no adult reactions known        Medication List        Accurate as of March 27, 2022  2:15 PM. If you have any questions, ask your nurse or doctor.          cefdinir 300 MG capsule Commonly known as: OMNICEF Take 1 capsule (300 mg total) by mouth 2 (two) times daily.   cephALEXin 500 MG capsule Commonly known as: KEFLEX Take 1 capsule (500 mg total) by mouth 2 (two) times daily.   Colace 100 MG capsule Generic drug: docusate sodium Take 100 mg by mouth 2 (two) times daily.   colestipol 1 g tablet Commonly known as: COLESTID Take by mouth.   Cyanocobalamin 1000 MCG Caps Take 1 tablet by mouth daily.   dexlansoprazole 60 MG capsule Commonly known as: DEXILANT TAKE 1 CAPSULE BY MOUTH DAILY   diclofenac Sodium 1 % Gel Commonly known as: VOLTAREN APPLY TO AFFECTED AREAS 3 TIMES DAILY AS NEEDED.   diphenoxylate-atropine 2.5-0.025 MG tablet Commonly known as: Lomotil Take 1 tablet by mouth 4 (four)  times daily as needed for diarrhea or loose stools.   Eliquis 5 MG Tabs tablet Generic drug: apixaban TAKE 1 TABLET BY MOUTH TWICE  DAILY   fluticasone 50 MCG/ACT nasal spray Commonly known as: FLONASE Place 2 sprays into both nostrils daily.   furosemide 40 MG tablet Commonly known as: LASIX TAKE 1 TABLET BY MOUTH  TWICE DAILY AS NEEDED FOR  EDEMA   hydrocortisone 2.5 % cream APPLY RECTALLY TWICE DAILY AS NEEDED- please dispense with rectal tip   levothyroxine 150 MCG tablet Commonly known as: SYNTHROID TAKE 1 TABLET BY MOUTH  DAILY BEFORE BREAKFAST   lidocaine 5 % ointment Commonly known as: XYLOCAINE APPLY TOPICALLY 4 TIMES  DAILY AS NEEDED   linaclotide 145 MCG Caps capsule Commonly known as: LINZESS Take 1 capsule (145 mcg total) by mouth daily before breakfast.   magnesium oxide 400 (240 Mg) MG  tablet Commonly known as: MAG-OX Take 1.5 tablet BID   magnesium oxide 400 MG tablet Commonly known as: MAG-OX Take by mouth.   morphine 15 MG 12 hr tablet Commonly known as: MS CONTIN Take 1 tablet (15 mg total) by mouth every 12 (twelve) hours.   Multi-Vitamin tablet Take 2 tablets by mouth daily.   Multivitamin Adult (Minerals) Tabs Take 1 tablet by mouth daily.   MULTIVITAMIN PO Take 1 tablet by mouth daily.   nortriptyline 25 MG capsule Commonly known as: Pamelor Take 1 capsule (25 mg total) by mouth at bedtime.   ondansetron 8 MG disintegrating tablet Commonly known as: ZOFRAN-ODT DISSOLVE 1 TABLET ON THE  TONGUE EVERY 8 HOURS AS  NEEDED FOR NAUSEA   oxyCODONE 5 MG immediate release tablet Commonly known as: Oxy IR/ROXICODONE Take 1 tablet (5 mg total) by mouth every 8 (eight) hours as needed for severe pain.   potassium chloride SA 20 MEQ tablet Commonly known as: KLOR-CON M TAKE 1 TABLET BY MOUTH DAILY   pravastatin 20 MG tablet Commonly known as: PRAVACHOL TAKE 1 TABLET BY MOUTH  DAILY   pregabalin 75 MG capsule Commonly known as: Lyrica Take 1 capsule (75 mg total) by mouth 2 (two) times daily.   STOOL SOFTENER PLUS PO Take by mouth.   tiZANidine 4 MG tablet Commonly known as: Zanaflex Take 1 tablet (4 mg total) by mouth every 6 (six) hours as needed for muscle spasms.   traZODone 50 MG tablet Commonly known as: DESYREL Take 1 tablet (50 mg total) by mouth at bedtime.   VITAMIN C PO Take 1 tablet by mouth daily.   Vitamin D2 50 MCG (2000 UT) Tabs tAKE 1 Tablet daily   Xifaxan 550 MG Tabs tablet Generic drug: rifaximin Take 550 mg by mouth 3 (three) times daily.        Allergies:  Allergies  Allergen Reactions   Bactrim [Sulfamethoxazole-Trimethoprim]    Codeine Hives   Lactose Diarrhea   Nitrofuran Derivatives Itching   Sulfa Antibiotics Hives   Penicillins Rash    Has patient had a PCN reaction causing immediate rash,  facial/tongue/throat swelling, SOB or lightheadedness with hypotension: No Has patient had a PCN reaction causing severe rash involving mucus membranes or skin necrosis: No Has patient had a PCN reaction that required hospitalization: No Has patient had a PCN reaction occurring within the last 10 years: No If all of the above answers are "NO", then may proceed with Cephalosporin use.  Childhood rash - no adult reactions known    Family History: Family History  Problem Relation Age of  Onset   Heart disease Mother        enlarged   Hypertension Mother    Stroke Mother    Diabetes Father    Diabetes Sister    Diabetes Maternal Grandmother    Colon cancer Neg Hx     Social History:  reports that she quit smoking about 5 years ago. Her smoking use included cigarettes. She has a 28.80 pack-year smoking history. She has never used smokeless tobacco. She reports that she does not drink alcohol and does not use drugs.  ROS: All other review of systems were reviewed and are negative except what is noted above in HPI  Physical Exam: BP 113/72   Pulse 82   Constitutional:  Alert and oriented, No acute distress. HEENT: [place] AT, moist mucus membranes.  Trachea midline, no masses. Cardiovascular: No clubbing, cyanosis, or edema. Respiratory: Normal respiratory effort, no increased work of breathing. GI: Abdomen is soft, nontender, nondistended, no abdominal masses GU: No CVA tenderness.  Lymph: No cervical or inguinal lymphadenopathy. Skin: No rashes, bruises or suspicious lesions. Neurologic: Grossly intact, no focal deficits, moving all 4 extremities. Psychiatric: Normal mood and affect.  Laboratory Data: Lab Results  Component Value Date   WBC 7.4 11/30/2020   HGB 12.4 11/30/2020   HCT 38.8 11/30/2020   MCV 78.2 (L) 11/30/2020   PLT 339 11/30/2020    Lab Results  Component Value Date   CREATININE 1.25 (H) 11/30/2020    No results found for: "PSA"  No results found for:  "TESTOSTERONE"  Lab Results  Component Value Date   HGBA1C 6.0 (H) 11/30/2020    Urinalysis    Component Value Date/Time   COLORURINE YELLOW 12/10/2021 1603   APPEARANCEUR CLOUDY (A) 12/10/2021 1603   APPEARANCEUR Clear 03/28/2021 1024   LABSPEC 1.015 12/10/2021 1603   PHURINE 5.5 12/10/2021 1603   GLUCOSEU NEGATIVE 12/10/2021 1603   HGBUR 1+ (A) 12/10/2021 1603   HGBUR small 05/22/2007 1334   BILIRUBINUR negative 02/08/2022 1952   BILIRUBINUR Negative 03/28/2021 1024   KETONESUR negative 02/08/2022 1952   KETONESUR NEGATIVE 12/10/2021 1603   PROTEINUR =30 (A) 02/08/2022 1952   PROTEINUR NEGATIVE 12/10/2021 1603   UROBILINOGEN 0.2 02/08/2022 1952   UROBILINOGEN 0.2 02/09/2014 1435   NITRITE Positive (A) 02/08/2022 1952   NITRITE NEGATIVE 12/10/2021 1603   LEUKOCYTESUR Small (1+) (A) 02/08/2022 1952   LEUKOCYTESUR 1+ (A) 12/10/2021 1603    Lab Results  Component Value Date   LABMICR See below: 03/28/2021   WBCUA 6-10 (A) 03/28/2021   LABEPIT 0-10 03/28/2021   MUCUS Present 03/28/2021   BACTERIA MODERATE (A) 12/10/2021    Pertinent Imaging:  Results for orders placed during the hospital encounter of 12/10/16  DG Abd 1 View  Narrative CLINICAL DATA:  Diarrhea, rectal pain, and gas intermittently for the past 2 years. History of surgical repair of bowel obstruction, urinary tract malignancy treated with radiation therapy and chemotherapy.  EXAM: ABDOMEN - 1 VIEW  COMPARISON:  Abdominopelvic CT scan of Nov 10, 2015  FINDINGS: The colonic stool burden is moderate. There is gas and stool in the rectum. There is no small or large bowel obstructive pattern. The bony structures exhibit no acute abnormalities.  IMPRESSION: No acute intra-abdominal abnormality is observed. Given the patient's history of thickening of the rectal wall, abdominal and pelvic CT scanning would be a useful next imaging step.   Electronically Signed By: David  Martinique M.D. On:  12/10/2016 17:05  No results found for  this or any previous visit.  No results found for this or any previous visit.  No results found for this or any previous visit.  No results found for this or any previous visit.  No valid procedures specified. No results found for this or any previous visit.  No results found for this or any previous visit.   Assessment & Plan:    1. Malignant neoplasm of urinary bladder, unspecified site [facility]) -Will call with renal US results -Urine cytology - Urinalysis, Routine w reflex microscopic   No follow-ups on file.  Nicolette Bang, MD  [facility] [place]

## 2022-03-27 NOTE — Patient Instructions (Signed)

## 2022-03-28 ENCOUNTER — Ambulatory Visit (HOSPITAL_COMMUNITY)
Admission: RE | Admit: 2022-03-28 | Discharge: 2022-03-28 | Disposition: A | Discharge status: Home / Self Care | Payer: Medicare Other | Source: Ambulatory Visit | Attending: Urology | Admitting: Urology

## 2022-03-28 DIAGNOSIS — C679 Malignant neoplasm of bladder, unspecified: Secondary | ICD-10-CM | POA: Diagnosis not present

## 2022-03-28 DIAGNOSIS — Z8551 Personal history of malignant neoplasm of bladder: Secondary | ICD-10-CM | POA: Diagnosis not present

## 2022-03-28 DIAGNOSIS — N281 Cyst of kidney, acquired: Secondary | ICD-10-CM | POA: Diagnosis not present

## 2022-03-28 DIAGNOSIS — Z906 Acquired absence of other parts of urinary tract: Secondary | ICD-10-CM | POA: Diagnosis not present

## 2022-03-28 LAB — CYTOLOGY, URINE

## 2022-04-01 NOTE — Progress Notes (Signed)
Letter sent.

## 2022-04-05 ENCOUNTER — Other Ambulatory Visit: Payer: Self-pay

## 2022-04-05 DIAGNOSIS — Z433 Encounter for attention to colostomy: Secondary | ICD-10-CM

## 2022-04-05 DIAGNOSIS — G8929 Other chronic pain: Secondary | ICD-10-CM

## 2022-04-05 DIAGNOSIS — K56609 Unspecified intestinal obstruction, unspecified as to partial versus complete obstruction: Secondary | ICD-10-CM

## 2022-04-05 MED ORDER — LINACLOTIDE 145 MCG PO CAPS: 145.0000 ug | ORAL_CAPSULE | Freq: Every day | ORAL | 5 refills | Status: DC

## 2022-04-15 DIAGNOSIS — Z936 Other artificial openings of urinary tract status: Secondary | ICD-10-CM | POA: Diagnosis not present

## 2022-04-26 ENCOUNTER — Telehealth: Payer: Self-pay

## 2022-04-26 ENCOUNTER — Other Ambulatory Visit: Payer: Self-pay | Admitting: Family Medicine

## 2022-04-26 MED ORDER — OXYCODONE HCL 5 MG PO TABS: 5.0000 mg | ORAL_TABLET | Freq: Three times a day (TID) | ORAL | 0 refills | Status: DC | PRN

## 2022-04-26 MED ORDER — MORPHINE SULFATE ER 15 MG PO TBCR: 15.0000 mg | EXTENDED_RELEASE_TABLET | Freq: Two times a day (BID) | ORAL | 0 refills | Status: DC

## 2022-04-26 NOTE — Telephone Encounter (Signed)
Pt called in to request a refill of these meds:   morphine (MS CONTIN) 15 MG 12 hr tablet [[id number]]  oxyCODONE (OXY IR/ROXICODONE) 5 MG immediate release tablet [[id number]]   LOV: 058/22/23   PHARMACY: [facility] [place]

## 2022-05-16 DIAGNOSIS — Z936 Other artificial openings of urinary tract status: Secondary | ICD-10-CM | POA: Diagnosis not present

## 2022-05-28 ENCOUNTER — Telehealth: Payer: Self-pay

## 2022-05-28 ENCOUNTER — Other Ambulatory Visit: Payer: Self-pay | Admitting: Family Medicine

## 2022-05-28 MED ORDER — OXYCODONE HCL 5 MG PO TABS: 5.0000 mg | ORAL_TABLET | Freq: Three times a day (TID) | ORAL | 0 refills | Status: DC | PRN

## 2022-05-28 MED ORDER — MORPHINE SULFATE ER 15 MG PO TBCR: 15.0000 mg | EXTENDED_RELEASE_TABLET | Freq: Two times a day (BID) | ORAL | 0 refills | Status: DC

## 2022-05-28 NOTE — Telephone Encounter (Signed)
Pt called requesting refills on her Oxycodone and Morphine. Both last refilled on 04/26/2022. Last OV 02/19/22. Thank you.

## 2022-06-06 ENCOUNTER — Encounter: Payer: Self-pay | Admitting: Family Medicine

## 2022-06-06 ENCOUNTER — Ambulatory Visit (INDEPENDENT_AMBULATORY_CARE_PROVIDER_SITE_OTHER): Payer: Medicare Other | Admitting: Family Medicine

## 2022-06-06 VITALS — BP 144/90 | HR 89 | Ht 65.0 in | Wt 181.0 lb

## 2022-06-06 DIAGNOSIS — M6289 Other specified disorders of muscle: Secondary | ICD-10-CM

## 2022-06-06 DIAGNOSIS — E039 Hypothyroidism, unspecified: Secondary | ICD-10-CM | POA: Diagnosis not present

## 2022-06-06 DIAGNOSIS — Z433 Encounter for attention to colostomy: Secondary | ICD-10-CM

## 2022-06-06 DIAGNOSIS — I10 Essential (primary) hypertension: Secondary | ICD-10-CM

## 2022-06-06 DIAGNOSIS — R7303 Prediabetes: Secondary | ICD-10-CM

## 2022-06-06 MED ORDER — LIDOCAINE 5 % EX OINT: TOPICAL_OINTMENT | CUTANEOUS | 1 refills | Status: DC

## 2022-06-06 NOTE — Progress Notes (Signed)
Subjective:    Patient ID: [name], female    DOB: [birth year], 71 y.o.   MRN: [id number]  HPI  Patient is a very pleasant 71 year old African-American female with a very complicated past medical history.  She states that in the late 90s around 1998 she had back surgery.  At that time she had a accidental perforation in her intestines during the back surgery that required surgical correction.  Then in 2012 she was found to have bladder cancer.  She underwent surgical treatment of her bladder cancer and suffered a perforation to her intestines as well.  Ultimately she developed a small bowel obstruction due to adhesions and was unable to eat.  She states she lost more than 100 pounds.  She ultimately was admitted to [facility] and underwent TPN.  She required TPN for 3 years from 2012 for 2015 due to inability to tolerate oral food because of bowel obstructions.  Ultimately she underwent surgery in 2015 after she developed a fistula at the stoma of her bladder after her bladder was removed due to bladder cancer.  During the surgery to correct her urostomy, the patient also went lysis of adhesions.  At that point the bowel obstruction resolved and the patient was able to start tolerating oral food again.  She is on Eliquis due to recurrent DVTs in her upper extremities stemming from her long-term use of TPN.  She is on chronic pain medication.  Patient has terrible rectal pain.  She states that it feels like she is being torn in half every time she has a bowel movement.  She had pelvic floor muscular dysfunction due to the severity of the pain and she is working with a specialist in [place].  Prior to working with a specialist for pelvic floor dysfunction, the patient was having 30 bowel movements a day that were searing and tearing with pain.  She has seen numerous specialist who "cannot find anything wrong" however she continues to have horrific pain with defecation.  However the specialist has  been able to reduce her frequency of BMs to 5 or 6 a day which is made her quality of life much better.  She still takes MS Contin twice a day and oxycodone 2-3 times a day due to the severity of pain.  A lot of her pain sounds neuropathic in nature versus also rectal fissures due to the end complete relaxation of the anal sphincter.    The above was copied from a previous visit as a summary of her PMH.    Patient is here today to discuss her pain medication.  Patient is very honest with me.  She states that she takes morphine every other day.  Her average daily use of morphine may be 15 mg.  She states that she averages about 2 pills of her oxycodone every day.  Some days she has to take as many as 4.  Some days she only takes 2.  We had a long discussion about switching to Suboxone due to risk of overdose.  Patient is hesitant to make this switch.  However I would like to simplify her pain regimen.  She thinks that she needs 3 to 4 pills of oxycodone per day if she discontinues the morphine.  Therefore 120 tablets of oxycodone 5 mg she believes will be sufficient and discontinue the morphine.  She is due to recheck lab work pertaining to her hypothyroidism.  Her blood pressure today is borderline.  She denies any  chest pain shortness of breath or dyspnea on exertion Past Medical History:  Diagnosis Date   Acid reflux    Bowel obstruction ([place]) several   Recurrent SBO secondary to adhesions   Cancer [facility]) 2011 [facility])   diagnosed in 2009 per pt. Invasive High grade Urothelial carcinoma- s/p radiation and Chemo    Chronic back pain    DVT (deep venous thrombosis) ([place]) 2015   Fatty liver    Gout    Hyperlipidemia    Hypertension    pt says taken off medication since has lost weight.   Hypothyroidism    Internal hemorrhoids    Leukopenia 12/06/2011   HIV serology negative   Malnutrition ([place])    PE (pulmonary embolism)    [facility]   Rectal ulcer 2016   [facility]   UTI  (lower urinary tract infection)    Recurrent   Past Surgical History:  Procedure Laterality Date   ABDOMINAL SURGERY     with intestinal "puncture" x 2, exploratory surgery    BACK SURGERY     X2   BLADDER REMOVAL     CHOLECYSTECTOMY     CYSTECTOMY     breast   HERNIA REPAIR     mesh   ILEO CONDUIT     For bladder cancer   PORTACATH PLACEMENT     Current Outpatient Medications on File Prior to Visit  Medication Sig Dispense Refill   Ascorbic Acid (VITAMIN C PO) Take 1 tablet by mouth daily.     Casanthranol-Docusate Sodium (STOOL SOFTENER PLUS PO) Take by mouth.     colestipol (COLESTID) 1 g tablet Take by mouth.     Cyanocobalamin 1000 MCG CAPS Take 1 tablet by mouth daily. 90 capsule 3   dexlansoprazole (DEXILANT) 60 MG capsule TAKE 1 CAPSULE BY MOUTH DAILY 100 capsule 1   diclofenac Sodium (VOLTAREN) 1 % GEL APPLY TO AFFECTED AREAS 3 TIMES DAILY AS NEEDED. 100 g 0   ELIQUIS 5 MG TABS tablet TAKE 1 TABLET BY MOUTH TWICE  DAILY 200 tablet 2   Ergocalciferol (VITAMIN D2) 50 MCG (2000 UT) TABS tAKE 1 Tablet daily 30 tablet    fluticasone (FLONASE) 50 MCG/ACT nasal spray Place 2 sprays into both nostrils daily. 16 g 6   furosemide (LASIX) 40 MG tablet TAKE 1 TABLET BY MOUTH  TWICE DAILY AS NEEDED FOR  EDEMA 180 tablet 3   levothyroxine (SYNTHROID) 150 MCG tablet TAKE 1 TABLET BY MOUTH  DAILY BEFORE BREAKFAST 90 tablet 3   linaclotide (LINZESS) 145 MCG CAPS capsule Take 1 capsule (145 mcg total) by mouth daily before breakfast. 30 capsule 5   magnesium oxide (MAG-OX) 400 MG tablet Take by mouth.     Magnesium Oxide 400 (240 Mg) MG TABS Take 1.5 tablet BID 90 tablet 2   morphine (MS CONTIN) 15 MG 12 hr tablet Take 1 tablet (15 mg total) by mouth every 12 (twelve) hours. 60 tablet 0   Multiple Vitamin (MULTI-VITAMIN) tablet Take 2 tablets by mouth daily.     Multiple Vitamins-Minerals (MULTIVITAMIN ADULT, MINERALS,) TABS Take 1 tablet by mouth daily.     ondansetron (ZOFRAN-ODT) 8 MG  disintegrating tablet DISSOLVE 1 TABLET ON THE  TONGUE EVERY 8 HOURS AS  NEEDED FOR NAUSEA 90 tablet 3   oxyCODONE (OXY IR/ROXICODONE) 5 MG immediate release tablet Take 1 tablet (5 mg total) by mouth every 8 (eight) hours as needed for severe pain. 90 tablet 0   potassium  chloride SA (KLOR-CON M) 20 MEQ tablet TAKE 1 TABLET BY MOUTH DAILY 100 tablet 2   pravastatin (PRAVACHOL) 20 MG tablet TAKE 1 TABLET BY MOUTH  DAILY 90 tablet 3   pregabalin (LYRICA) 75 MG capsule Take 1 capsule (75 mg total) by mouth 2 (two) times daily. 60 capsule 0   tiZANidine (ZANAFLEX) 4 MG tablet Take 1 tablet (4 mg total) by mouth every 6 (six) hours as needed for muscle spasms. 30 tablet 0   traZODone (DESYREL) 50 MG tablet Take 1 tablet (50 mg total) by mouth at bedtime. 30 tablet 1   No current facility-administered medications on file prior to visit.   Allergies  Allergen Reactions   Bactrim [Sulfamethoxazole-Trimethoprim]    Codeine Hives   Lactose Diarrhea   Nitrofuran Derivatives Itching   Sulfa Antibiotics Hives   Penicillins Rash    Has patient had a PCN reaction causing immediate rash, facial/tongue/throat swelling, SOB or lightheadedness with hypotension: No Has patient had a PCN reaction causing severe rash involving mucus membranes or skin necrosis: No Has patient had a PCN reaction that required hospitalization: No Has patient had a PCN reaction occurring within the last 10 years: No If all of the above answers are "NO", then may proceed with Cephalosporin use.  Childhood rash - no adult reactions known   Social History   Socioeconomic History   Marital status: Widowed    Spouse name: Not on file   Number of children: 2   Years of education: Not on file   Highest education level: Not on file  Occupational History    Employer: UNEMPLOYED  Tobacco Use   Smoking status: Former    Packs/day: 0.60    Years: 48.00    Total pack years: 28.80    Types: Cigarettes    Quit date: 10/03/2016     Years since quitting: 5.6   Smokeless tobacco: Never  Vaping Use   Vaping Use: Never used  Substance and Sexual Activity   Alcohol use: No   Drug use: No   Sexual activity: Yes    Birth control/protection: Post-menopausal  Other Topics Concern   Not on file  Social History Narrative   Not on file   Social Determinants of Health   Financial Resource Strain: Low Risk  (08/03/2021)   Overall Financial Resource Strain (CARDIA)    Difficulty of Paying Living Expenses: Not hard at all  Food Insecurity: No Food Insecurity (08/03/2021)   Hunger Vital Sign    Worried About Running Out of Food in the Last Year: Never true    [place] in the Last Year: Never true  Transportation Needs: No Transportation Needs (08/03/2021)   PRAPARE - Hydrologist (Medical): No    Lack of Transportation (Non-Medical): No  Physical Activity: Insufficiently Active (08/03/2021)   Exercise Vital Sign    Days of Exercise per Week: 3 days    Minutes of Exercise per Session: 20 min  Stress: No Stress Concern Present (08/03/2021)   [place]    Feeling of Stress : Not at all  Social Connections: Moderately Integrated (08/03/2021)   Social Connection and Isolation Panel [NHANES]    Frequency of Communication with Friends and Family: More than three times a week    Frequency of Social Gatherings with Friends and Family: More than three times a week    Attends Religious Services: More than 4 times per year  Active Member of Clubs or Organizations: Yes    Attends Archivist Meetings: More than 4 times per year    Marital Status: Widowed  Intimate Partner Violence: Not At Risk (08/03/2021)   Humiliation, Afraid, Rape, and Kick questionnaire    Fear of Current or Ex-Partner: No    Emotionally Abused: No    Physically Abused: No    Sexually Abused: No       Review of Systems  All other systems reviewed and  are negative.      Objective:   Physical Exam Vitals reviewed.  Eyes:     General: No scleral icterus.       Right eye: No discharge.        Left eye: No discharge.     Conjunctiva/sclera: Conjunctivae normal.     Pupils: Pupils are equal, round, and reactive to light.  Cardiovascular:     Rate and Rhythm: Normal rate and regular rhythm.     Heart sounds: Normal heart sounds. No murmur heard. Pulmonary:     Effort: Pulmonary effort is normal. No respiratory distress.     Breath sounds: Normal breath sounds. No wheezing or rales.  Chest:     Chest wall: No tenderness.  Abdominal:     General: Bowel sounds are normal. There is no distension.     Palpations: Abdomen is soft. There is no mass.     Tenderness: There is no abdominal tenderness. There is no guarding or rebound.  Skin:    General: Skin is warm.     Findings: No erythema or rash.  Neurological:     Mental Status: She is alert and oriented to person, place, and time.     Cranial Nerves: No cranial nerve deficit.     Motor: No abnormal muscle tone.     Coordination: Coordination normal.     Deep Tendon Reflexes: Reflexes are normal and symmetric.    No bruit       Assessment & Plan:  Colostomy care ([place]) - Plan: lidocaine (XYLOCAINE) 5 % ointment  Essential hypertension - Plan: Hemoglobin A1c, CBC with Differential/Platelet, COMPLETE METABOLIC PANEL WITH GFR, TSH  Borderline diabetes - Plan: Hemoglobin A1c, CBC with Differential/Platelet, COMPLETE METABOLIC PANEL WITH GFR, TSH  Hypothyroidism, unspecified type - Plan: Hemoglobin A1c, CBC with Differential/Platelet, COMPLETE METABOLIC PANEL WITH GFR, TSH  Pelvic floor dysfunction in female We had a long discussion regarding her chronic pain.  We agreed to discontinue morphine.  Will simplify her regimen and I will give her Percocet 5/325 1 tablet every 6 hours as needed pain 30 times a day.  20 tablets a month.  I will also check a CMP, and a TSH, and a CBC to  monitor her blood counts as well as a hemoglobin A1c given her history of borderline diabetes mellitus type 2.

## 2022-06-07 ENCOUNTER — Other Ambulatory Visit: Payer: Self-pay

## 2022-06-07 DIAGNOSIS — E039 Hypothyroidism, unspecified: Secondary | ICD-10-CM

## 2022-06-07 LAB — COMPLETE METABOLIC PANEL WITH GFR
AG Ratio: 1.3 (calc) (ref 1.0–2.5)
ALT: 8 U/L (ref 6–29)
AST: 10 U/L (ref 10–35)
Albumin: 4.2 g/dL (ref 3.6–5.1)
Alkaline phosphatase (APISO): 112 U/L (ref 37–153)
BUN/Creatinine Ratio: 13 (calc) (ref 6–22)
BUN: 17 mg/dL (ref 7–25)
CO2: 26 mmol/L (ref 20–32)
Calcium: 9.6 mg/dL (ref 8.6–10.4)
Chloride: 103 mmol/L (ref 98–110)
Creat: 1.26 mg/dL — ABNORMAL HIGH (ref 0.60–1.00)
Globulin: 3.2 g/dL (calc) (ref 1.9–3.7)
Glucose, Bld: 107 mg/dL — ABNORMAL HIGH (ref 65–99)
Potassium: 3.8 mmol/L (ref 3.5–5.3)
Sodium: 139 mmol/L (ref 135–146)
Total Bilirubin: 0.6 mg/dL (ref 0.2–1.2)
Total Protein: 7.4 g/dL (ref 6.1–8.1)
eGFR: 46 mL/min/{1.73_m2} — ABNORMAL LOW (ref >=60)

## 2022-06-07 LAB — CBC WITH DIFFERENTIAL/PLATELET
Absolute Monocytes: 648 cells/uL (ref 200–950)
Basophils Absolute: 54 cells/uL (ref 0–200)
Basophils Relative: 0.6 %
Eosinophils Absolute: 162 cells/uL (ref 15–500)
Eosinophils Relative: 1.8 %
HCT: 40.4 % (ref 35.0–45.0)
Hemoglobin: 13.4 g/dL (ref 11.7–15.5)
Lymphs Abs: 2007 cells/uL (ref 850–3900)
MCH: 25.9 pg — ABNORMAL LOW (ref 27.0–33.0)
MCHC: 33.2 g/dL (ref 32.0–36.0)
MCV: 78 fL — ABNORMAL LOW (ref 80.0–100.0)
MPV: 11 fL (ref 7.5–12.5)
Monocytes Relative: 7.2 %
Neutro Abs: 6129 cells/uL (ref 1500–7800)
Neutrophils Relative %: 68.1 %
Platelets: 374 10*3/uL (ref 140–400)
RBC: 5.18 10*6/uL — ABNORMAL HIGH (ref 3.80–5.10)
RDW: 16.4 % — ABNORMAL HIGH (ref 11.0–15.0)
Total Lymphocyte: 22.3 %
WBC: 9 10*3/uL (ref 3.8–10.8)

## 2022-06-07 LAB — HEMOGLOBIN A1C
Hgb A1c MFr Bld: 6 % of total Hgb — ABNORMAL HIGH (ref <5.7)
Mean Plasma Glucose: 126 mg/dL
eAG (mmol/L): 7 mmol/L

## 2022-06-07 LAB — TSH: TSH: 0.21 mIU/L — ABNORMAL LOW (ref 0.40–4.50)

## 2022-06-07 MED ORDER — LEVOTHYROXINE SODIUM 125 MCG PO TABS: 125.0000 ug | ORAL_TABLET | Freq: Every day | ORAL | 3 refills | Status: DC

## 2022-06-07 NOTE — Progress Notes (Signed)
levo

## 2022-06-17 ENCOUNTER — Other Ambulatory Visit: Payer: Self-pay

## 2022-06-17 MED ORDER — TRAZODONE HCL 50 MG PO TABS: 50.0000 mg | ORAL_TABLET | Freq: Every day | ORAL | 1 refills | Status: DC

## 2022-06-21 DIAGNOSIS — Z936 Other artificial openings of urinary tract status: Secondary | ICD-10-CM | POA: Diagnosis not present

## 2022-06-25 ENCOUNTER — Other Ambulatory Visit: Payer: Self-pay | Admitting: Family Medicine

## 2022-06-25 ENCOUNTER — Telehealth: Payer: Self-pay

## 2022-06-25 MED ORDER — OXYCODONE HCL 5 MG PO TABS: 5.0000 mg | ORAL_TABLET | Freq: Three times a day (TID) | ORAL | 0 refills | Status: DC | PRN

## 2022-06-25 NOTE — Telephone Encounter (Signed)
Pt called requesting a refill on her Oxycodone 5 mg. Last RF was 06/27/2022. Thank you.  Pt uses [name] in [place].

## 2022-06-26 ENCOUNTER — Telehealth: Payer: Self-pay

## 2022-06-26 NOTE — Telephone Encounter (Signed)
Pt called today stating that her oxyCODONE #DS should 100? Per pt, said that you up it?  Pls advice

## 2022-06-27 ENCOUNTER — Other Ambulatory Visit: Payer: Self-pay | Admitting: Family Medicine

## 2022-06-27 DIAGNOSIS — E038 Other specified hypothyroidism: Secondary | ICD-10-CM

## 2022-06-27 MED ORDER — OXYCODONE HCL 5 MG PO TABS: 5.0000 mg | ORAL_TABLET | Freq: Four times a day (QID) | ORAL | 0 refills | Status: DC | PRN

## 2022-06-27 NOTE — Telephone Encounter (Signed)
Pls varify if pt is back on this medication?

## 2022-07-23 ENCOUNTER — Ambulatory Visit (INDEPENDENT_AMBULATORY_CARE_PROVIDER_SITE_OTHER): Payer: 59 | Admitting: Family Medicine

## 2022-07-23 VITALS — BP 122/78 | HR 97 | Temp 97.5°F | Ht 65.0 in | Wt 183.0 lb

## 2022-07-23 DIAGNOSIS — R051 Acute cough: Secondary | ICD-10-CM

## 2022-07-23 DIAGNOSIS — J019 Acute sinusitis, unspecified: Secondary | ICD-10-CM

## 2022-07-23 DIAGNOSIS — B9689 Other specified bacterial agents as the cause of diseases classified elsewhere: Secondary | ICD-10-CM

## 2022-07-23 MED ORDER — DOXYCYCLINE HYCLATE 100 MG PO TABS: 100.0000 mg | ORAL_TABLET | Freq: Two times a day (BID) | ORAL | 0 refills | Status: DC

## 2022-07-23 MED ORDER — BENZONATATE 200 MG PO CAPS: 200.0000 mg | ORAL_CAPSULE | Freq: Two times a day (BID) | ORAL | 0 refills | Status: DC | PRN

## 2022-07-23 NOTE — Progress Notes (Signed)
Acute Office Visit  Subjective:     Patient ID: [name], female    DOB: 1950/09/05, 72 y.o.   MRN: [id number]  Chief Complaint  Patient presents with   Acute Visit    fighting a cold for a week/doesn't seem to getting better       HPI Patient is in today for 1 week of mucopurulent cough, congestion, rhinorrhea, fatigue, sinus pressure, and headache, hot flashes, and sore throat Denies fevers, shortness of breath, wheezing, chest pain, or nausea She has been exposed to sick daughter and granddaughter- covid negative She has tried nyquil, dayquil, and zicam.  Review of Systems  All other systems reviewed and are negative.  Past Medical History:  Diagnosis Date   Acid reflux    Bowel obstruction ([place]) several   Recurrent SBO secondary to adhesions   Cancer [facility]) 2011 [facility])   diagnosed in 2009 per pt. Invasive High grade Urothelial carcinoma- s/p radiation and Chemo    Chronic back pain    DVT (deep venous thrombosis) ([place]) 2015   Fatty liver    Gout    Hyperlipidemia    Hypertension    pt says taken off medication since has lost weight.   Hypothyroidism    Internal hemorrhoids    Leukopenia 12/06/2011   HIV serology negative   Malnutrition ([place])    PE (pulmonary embolism)    [facility]   Rectal ulcer 2016   [facility]   UTI (lower urinary tract infection)    Recurrent   Past Surgical History:  Procedure Laterality Date   ABDOMINAL SURGERY     with intestinal "puncture" x 2, exploratory surgery    BACK SURGERY     X2   BLADDER REMOVAL     CHOLECYSTECTOMY     CYSTECTOMY     breast   HERNIA REPAIR     mesh   ILEO CONDUIT     For bladder cancer   PORTACATH PLACEMENT     Current Outpatient Medications on File Prior to Visit  Medication Sig Dispense Refill   Ascorbic Acid (VITAMIN C PO) Take 1 tablet by mouth daily.     Casanthranol-Docusate Sodium (STOOL SOFTENER PLUS PO) Take by mouth.     colestipol (COLESTID) 1 g tablet  Take by mouth.     Cyanocobalamin 1000 MCG CAPS Take 1 tablet by mouth daily. 90 capsule 3   dexlansoprazole (DEXILANT) 60 MG capsule TAKE 1 CAPSULE BY MOUTH DAILY 100 capsule 1   diclofenac Sodium (VOLTAREN) 1 % GEL APPLY TO AFFECTED AREAS 3 TIMES DAILY AS NEEDED. 100 g 0   ELIQUIS 5 MG TABS tablet TAKE 1 TABLET BY MOUTH TWICE  DAILY 200 tablet 2   Ergocalciferol (VITAMIN D2) 50 MCG (2000 UT) TABS tAKE 1 Tablet daily 30 tablet    fluticasone (FLONASE) 50 MCG/ACT nasal spray Place 2 sprays into both nostrils daily. 16 g 6   furosemide (LASIX) 40 MG tablet TAKE 1 TABLET BY MOUTH  TWICE DAILY AS NEEDED FOR  EDEMA 180 tablet 3   levothyroxine (SYNTHROID) 125 MCG tablet Take 1 tablet (125 mcg total) by mouth daily. 90 tablet 3   lidocaine (XYLOCAINE) 5 % ointment APPLY TOPICALLY 4 TIMES  DAILY AS NEEDED 318.96 g 1   linaclotide (LINZESS) 145 MCG CAPS capsule Take 1 capsule (145 mcg total) by mouth daily before breakfast. 30 capsule 5   magnesium oxide (MAG-OX) 400 MG tablet Take by mouth.  Magnesium Oxide 400 (240 Mg) MG TABS Take 1.5 tablet BID 90 tablet 2   Multiple Vitamin (MULTI-VITAMIN) tablet Take 2 tablets by mouth daily.     Multiple Vitamins-Minerals (MULTIVITAMIN ADULT, MINERALS,) TABS Take 1 tablet by mouth daily.     ondansetron (ZOFRAN-ODT) 8 MG disintegrating tablet DISSOLVE 1 TABLET ON THE  TONGUE EVERY 8 HOURS AS  NEEDED FOR NAUSEA 90 tablet 3   oxyCODONE (OXY IR/ROXICODONE) 5 MG immediate release tablet Take 1 tablet (5 mg total) by mouth every 6 (six) hours as needed for severe pain. 120 tablet 0   potassium chloride SA (KLOR-CON M) 20 MEQ tablet TAKE 1 TABLET BY MOUTH DAILY 100 tablet 2   pravastatin (PRAVACHOL) 20 MG tablet TAKE 1 TABLET BY MOUTH  DAILY 90 tablet 3   pregabalin (LYRICA) 75 MG capsule Take 1 capsule (75 mg total) by mouth 2 (two) times daily. 60 capsule 0   traZODone (DESYREL) 50 MG tablet Take 1 tablet (50 mg total) by mouth at bedtime. 30 tablet 1   No  current facility-administered medications on file prior to visit.   Allergies  Allergen Reactions   Bactrim [Sulfamethoxazole-Trimethoprim]    Codeine Hives   Lactose Diarrhea   Nitrofuran Derivatives Itching   Sulfa Antibiotics Hives   Penicillins Rash    Has patient had a PCN reaction causing immediate rash, facial/tongue/throat swelling, SOB or lightheadedness with hypotension: No Has patient had a PCN reaction causing severe rash involving mucus membranes or skin necrosis: No Has patient had a PCN reaction that required hospitalization: No Has patient had a PCN reaction occurring within the last 10 years: No If all of the above answers are "NO", then may proceed with Cephalosporin use.  Childhood rash - no adult reactions known         Objective:    BP 122/78   Pulse 97   Temp (!) 97.5 F (36.4 C) (Oral)   Ht '5\' 5"'$  (1.651 m)   Wt 183 lb (83 kg)   SpO2 97%   BMI 30.45 kg/m    Physical Exam Vitals and nursing note reviewed.  Constitutional:      Appearance: Normal appearance. She is normal weight.  HENT:     Head: Normocephalic and atraumatic.     Right Ear: Ear canal and external ear normal. There is impacted cerumen.     Left Ear: Ear canal and external ear normal. There is impacted cerumen.     Nose: Nose normal.     Mouth/Throat:     Mouth: Mucous membranes are moist.     Pharynx: Posterior oropharyngeal erythema present.  Eyes:     Conjunctiva/sclera: Conjunctivae normal.  Cardiovascular:     Rate and Rhythm: Normal rate and regular rhythm.     Pulses: Normal pulses.     Heart sounds: Normal heart sounds.  Pulmonary:     Effort: Pulmonary effort is normal.     Breath sounds: Normal breath sounds.  Musculoskeletal:     Cervical back: No tenderness.  Lymphadenopathy:     Cervical: No cervical adenopathy.  Skin:    General: Skin is warm and dry.  Neurological:     General: No focal deficit present.     Mental Status: She is alert and oriented to  person, place, and time. Mental status is at baseline.  Psychiatric:        Mood and Affect: Mood normal.        Behavior: Behavior normal.  Thought Content: Thought content normal.        Judgment: Judgment normal.     No results found for any visits on 07/23/22.      Assessment & Plan:   Problem List Items Addressed This Visit       Respiratory   Acute bacterial sinusitis - Primary    Patients symptoms consistent with bacterial sinusitis with 1 week of mucopurulent rhinorrhea, intermittent fevers/chills, and sinus pressure. Will treat with Doxycycline '100mg'$  BID for 10 days. May continue OTC symptomatic management. Instructed to return to office if symptoms persist or worsen.      Relevant Medications   doxycycline (VIBRA-TABS) 100 MG tablet   benzonatate (TESSALON) 200 MG capsule   Other Visit Diagnoses     Acute cough           Meds ordered this encounter  Medications   doxycycline (VIBRA-TABS) 100 MG tablet    Sig: Take 1 tablet (100 mg total) by mouth 2 (two) times daily.    Dispense:  14 tablet    Refill:  0    Order Specific Question:   Supervising Provider    Answer:   Jenna Luo T [[id number]]   benzonatate (TESSALON) 200 MG capsule    Sig: Take 1 capsule (200 mg total) by mouth 2 (two) times daily as needed for cough.    Dispense:  20 capsule    Refill:  0    Order Specific Question:   Supervising Provider    Answer:   Jenna Luo T [[id number]]    Return if symptoms worsen or fail to improve.  Rubie Maid, FNP

## 2022-07-23 NOTE — Assessment & Plan Note (Addendum)
Patients symptoms consistent with bacterial sinusitis with 1 week of mucopurulent rhinorrhea, intermittent fevers/chills, and sinus pressure. Will treat with Doxycycline '100mg'$  BID for 10 days. May continue OTC symptomatic management. Instructed to return to office if symptoms persist or worsen.

## 2022-07-29 ENCOUNTER — Telehealth: Payer: Self-pay

## 2022-07-29 ENCOUNTER — Other Ambulatory Visit: Payer: Self-pay | Admitting: Family Medicine

## 2022-07-29 ENCOUNTER — Other Ambulatory Visit: Payer: Self-pay

## 2022-07-29 DIAGNOSIS — E039 Hypothyroidism, unspecified: Secondary | ICD-10-CM

## 2022-07-29 DIAGNOSIS — Z936 Other artificial openings of urinary tract status: Secondary | ICD-10-CM | POA: Diagnosis not present

## 2022-07-29 MED ORDER — LEVOTHYROXINE SODIUM 125 MCG PO TABS: 125.0000 ug | ORAL_TABLET | Freq: Every day | ORAL | 3 refills | Status: DC

## 2022-07-29 MED ORDER — OXYCODONE HCL 5 MG PO TABS: 5.0000 mg | ORAL_TABLET | Freq: Four times a day (QID) | ORAL | 0 refills | Status: DC | PRN

## 2022-07-29 NOTE — Telephone Encounter (Signed)
Pt called requesting Refill on Oxycodone be sent to [facility] in [place]. Last RF 06/27/2022. Thank you.

## 2022-08-01 DIAGNOSIS — Z936 Other artificial openings of urinary tract status: Secondary | ICD-10-CM | POA: Diagnosis not present

## 2022-08-09 ENCOUNTER — Ambulatory Visit (INDEPENDENT_AMBULATORY_CARE_PROVIDER_SITE_OTHER): Payer: 59

## 2022-08-09 DIAGNOSIS — Z0000 Encounter for general adult medical examination without abnormal findings: Secondary | ICD-10-CM | POA: Diagnosis not present

## 2022-08-09 NOTE — Progress Notes (Signed)
Subjective:   [name] is a 72 y.o. female who presents for Medicare Annual (Subsequent) preventive examination.  Review of Systems     Cardiac Risk Factors include: obesity (BMI >30kg/m2);sedentary lifestyle;advanced age (>79mn, >>57women);dyslipidemia     Objective:    Today's Vitals   08/09/22 1102  PainSc: 3    There is no height or weight on file to calculate BMI.     08/09/2022   11:44 AM 08/03/2021   10:43 AM 08/26/2020   10:26 PM 04/04/2020    2:59 PM 09/11/2019   12:58 PM 10/13/2017    2:18 PM 12/16/2016    2:42 PM  Advanced Directives  Does Patient Have a Medical Advance Directive? No No No No No No No  Would patient like information on creating a medical advance directive?  No - Patient declined No - Patient declined No - Patient declined  No - Patient declined No - Patient declined    Current Medications (verified) Outpatient Encounter Medications as of 08/09/2022  Medication Sig   Ascorbic Acid (VITAMIN C PO) Take 1 tablet by mouth daily.   Casanthranol-Docusate Sodium (STOOL SOFTENER PLUS PO) Take by mouth.   colestipol (COLESTID) 1 g tablet Take by mouth.   Cyanocobalamin 1000 MCG CAPS Take 1 tablet by mouth daily.   dexlansoprazole (DEXILANT) 60 MG capsule TAKE 1 CAPSULE BY MOUTH DAILY   diclofenac Sodium (VOLTAREN) 1 % GEL APPLY TO AFFECTED AREAS 3 TIMES DAILY AS NEEDED.   ELIQUIS 5 MG TABS tablet TAKE 1 TABLET BY MOUTH TWICE  DAILY   Ergocalciferol (VITAMIN D2) 50 MCG (2000 UT) TABS tAKE 1 Tablet daily   fluticasone (FLONASE) 50 MCG/ACT nasal spray Place 2 sprays into both nostrils daily.   furosemide (LASIX) 40 MG tablet TAKE 1 TABLET BY MOUTH  TWICE DAILY AS NEEDED FOR  EDEMA   levothyroxine (SYNTHROID) 125 MCG tablet Take 1 tablet (125 mcg total) by mouth daily.   lidocaine (XYLOCAINE) 5 % ointment APPLY TOPICALLY 4 TIMES  DAILY AS NEEDED   linaclotide (LINZESS) 145 MCG CAPS capsule Take 1 capsule (145 mcg total) by mouth daily before breakfast.    magnesium oxide (MAG-OX) 400 MG tablet Take by mouth.   Magnesium Oxide 400 (240 Mg) MG TABS Take 1.5 tablet BID   Multiple Vitamin (MULTI-VITAMIN) tablet Take 2 tablets by mouth daily.   Multiple Vitamins-Minerals (MULTIVITAMIN ADULT, MINERALS,) TABS Take 1 tablet by mouth daily.   ondansetron (ZOFRAN-ODT) 8 MG disintegrating tablet DISSOLVE 1 TABLET ON THE  TONGUE EVERY 8 HOURS AS  NEEDED FOR NAUSEA   oxyCODONE (OXY IR/ROXICODONE) 5 MG immediate release tablet Take 1 tablet (5 mg total) by mouth every 6 (six) hours as needed for severe pain.   potassium chloride SA (KLOR-CON M) 20 MEQ tablet TAKE 1 TABLET BY MOUTH DAILY   pravastatin (PRAVACHOL) 20 MG tablet TAKE 1 TABLET BY MOUTH  DAILY   pregabalin (LYRICA) 75 MG capsule Take 1 capsule (75 mg total) by mouth 2 (two) times daily.   traZODone (DESYREL) 50 MG tablet Take 1 tablet (50 mg total) by mouth at bedtime.   benzonatate (TESSALON) 200 MG capsule Take 1 capsule (200 mg total) by mouth 2 (two) times daily as needed for cough. (Patient not taking: Reported on 08/09/2022)   doxycycline (VIBRA-TABS) 100 MG tablet Take 1 tablet (100 mg total) by mouth 2 (two) times daily. (Patient not taking: Reported on 08/09/2022)   No facility-administered encounter medications on file as of  08/09/2022.    Allergies (verified) Bactrim [sulfamethoxazole-trimethoprim], Codeine, Lactose, Nitrofuran derivatives, Sulfa antibiotics, and Penicillins   History: Past Medical History:  Diagnosis Date   Acid reflux    Bowel obstruction ([place]) several   Recurrent SBO secondary to adhesions   Cancer [facility]) 2011 [facility])   diagnosed in 2009 per pt. Invasive High grade Urothelial carcinoma- s/p radiation and Chemo    Chronic back pain    DVT (deep venous thrombosis) ([place]) 2015   Fatty liver    Gout    Hyperlipidemia    Hypertension    pt says taken off medication since has lost weight.   Hypothyroidism    Internal hemorrhoids    Leukopenia 12/06/2011   HIV  serology negative   Malnutrition ([place])    PE (pulmonary embolism)    [facility]   Rectal ulcer 2016   [facility]   UTI (lower urinary tract infection)    Recurrent   Past Surgical History:  Procedure Laterality Date   ABDOMINAL SURGERY     with intestinal "puncture" x 2, exploratory surgery    BACK SURGERY     X2   BLADDER REMOVAL     CHOLECYSTECTOMY     CYSTECTOMY     breast   HERNIA REPAIR     mesh   ILEO CONDUIT     For bladder cancer   PORTACATH PLACEMENT     Family History  Problem Relation Age of Onset   Heart disease Mother        enlarged   Hypertension Mother    Stroke Mother    Diabetes Father    Diabetes Sister    Diabetes Maternal Grandmother    Colon cancer Neg Hx    Social History   Socioeconomic History   Marital status: Widowed    Spouse name: Not on file   Number of children: 2   Years of education: Not on file   Highest education level: Not on file  Occupational History    Employer: UNEMPLOYED  Tobacco Use   Smoking status: Former    Packs/day: 0.60    Years: 48.00    Total pack years: 28.80    Types: Cigarettes    Quit date: 10/03/2016    Years since quitting: 5.8   Smokeless tobacco: Never  Vaping Use   Vaping Use: Never used  Substance and Sexual Activity   Alcohol use: No   Drug use: No   Sexual activity: Yes    Birth control/protection: Post-menopausal  Other Topics Concern   Not on file  Social History Narrative   Not on file   Social Determinants of Health   Financial Resource Strain: Medium Risk (08/09/2022)   Overall Financial Resource Strain (CARDIA)    Difficulty of Paying Living Expenses: Somewhat hard  Food Insecurity: No Food Insecurity (08/09/2022)   Hunger Vital Sign    Worried About Running Out of Food in the Last Year: Never true    Ran Out of Food in the Last Year: Never true  Transportation Needs: Unmet Transportation Needs (08/09/2022)   PRAPARE - Transportation    Lack of Transportation  (Medical): Yes    Lack of Transportation (Non-Medical): Yes  Physical Activity: Insufficiently Active (08/09/2022)   Exercise Vital Sign    Days of Exercise per Week: 3 days    Minutes of Exercise per Session: 20 min  Stress: No Stress Concern Present (08/09/2022)   [place]  Feeling of Stress : Not at all  Social Connections: Unknown (08/09/2022)   Social Connection and Isolation Panel [NHANES]    Frequency of Communication with Friends and Family: Once a week    Frequency of Social Gatherings with Friends and Family: Once a week    Attends Religious Services: Not on Advertising copywriter or Organizations: Yes    Attends Archivist Meetings: 1 to 4 times per year    Marital Status: Widowed    Tobacco Counseling Counseling given: Not Answered   Clinical Intake:     Pain : 0-10 (rectum) Pain Score: 3  Pain Type: Chronic pain Pain Location: Rectum (have pains when she uses the bathroom) Pain Descriptors / Indicators: Burning Pain Onset: Other (comment) (per pt many years) Pain Frequency: Constant Pain Relieving Factors: pain meds  Pain Relieving Factors: pain meds  BMI - recorded: 30.45 Nutritional Status: BMI > 30  Obese  How often do you need to have someone help you when you read instructions, pamphlets, or other written materials from your doctor or pharmacy?: 1 - Never What is the last grade level you completed in school?: GED  Diabetic?n/a  Interpreter Needed?: No      Activities of Daily Living    08/09/2022   11:44 AM  In your present state of health, do you have any difficulty performing the following activities:  Hearing? 1  Vision? 0  Difficulty concentrating or making decisions? 0  Walking or climbing stairs? 0  Dressing or bathing? 0  Doing errands, shopping? 0  Preparing Food and eating ? N  Using the Toilet? N  In the past six months, have you accidently leaked  urine? N  Do you have problems with loss of bowel control? Y  Comment all the time  Managing your Medications? Y  Managing your Finances? Y  Housekeeping or managing your Housekeeping? Y    Patient Care Team: Susy Frizzle, MD as PCP - General (Family Medicine) Gala Romney Cristopher Estimable, MD as Attending Physician (Gastroenterology)  Indicate any recent Medical Services you may have received from other than [facility] providers in the past year (date may be approximate).     Assessment:   This is a routine wellness examination for [name].  Hearing/Vision screen No results found.  Dietary issues and exercise activities discussed: Current Exercise Habits: Home exercise routine, Type of exercise: stretching, Time (Minutes): 40, Frequency (Times/Week): 5, Weekly Exercise (Minutes/Week): 200, Intensity: Mild   Goals Addressed             This Visit's Progress    Exercise 3x per week (30 min per time)   On track    Keep doing exercises and continue to eat healthy.       Depression Screen    08/09/2022   11:42 AM 06/06/2022    3:38 PM 02/19/2022   11:32 AM 08/03/2021   10:38 AM 04/28/2020   10:59 AM 04/04/2020    2:56 PM 03/09/2020    1:17 PM  PHQ 2/9 Scores  PHQ - 2 Score 0 1 3 0 0 0 0  PHQ- 9 Score   13        Fall Risk    06/06/2022    3:37 PM 08/03/2021   10:43 AM 04/28/2020   10:59 AM 04/04/2020    2:56 PM 03/09/2020    4:40 PM  Fall Risk   Falls in the past year? 0 0 0 0 0  Number  falls in past yr: 0 0 0  0  Injury with Fall? 0 0 0  0  Risk for fall due to : No Fall Risks Medication side effect  No Fall Risks   Follow up Falls prevention discussed Falls prevention discussed Falls evaluation completed Falls evaluation completed     [place]:  Any stairs in or around the home?  yes If so, are there any without handrails? Yes  Home free of loose throw rugs in walkways, pet beds, electrical cords, etc? Yes  Adequate lighting in your home to  reduce risk of falls? Yes   ASSISTIVE DEVICES UTILIZED TO PREVENT FALLS:  Life alert? No  Use of a cane, walker or w/c? No  Grab bars in the bathroom? Yes  Shower chair or bench in shower? No  Elevated toilet seat or a handicapped toilet? No   TIMED UP AND GO:  Was the test performed?  n/a .  Length of time to ambulate 10 feet: n/a sec.     Cognitive Function:        08/09/2022   11:46 AM 08/03/2021   10:46 AM  6CIT Screen  What Year? 0 points 0 points  What month? 0 points 0 points  What time? 0 points 0 points  Count back from 20 0 points 0 points  Months in reverse 0 points 0 points  Repeat phrase 2 points 0 points  Total Score 2 points 0 points    Immunizations Immunization History  Administered Date(s) Administered   PFIZER(Purple Top)SARS-COV-2 Vaccination 07/22/2019, 08/12/2019   PPD Test 01/23/2015   Tdap 04/18/2017    TDAP status: Due, Education has been provided regarding the importance of this vaccine. Advised may receive this vaccine at local pharmacy or Health Dept. Aware to provide a copy of the vaccination record if obtained from local pharmacy or Health Dept. Verbalized acceptance and understanding.  Flu Vaccine status: Declined, Education has been provided regarding the importance of this vaccine but patient still declined. Advised may receive this vaccine at local pharmacy or Health Dept. Aware to provide a copy of the vaccination record if obtained from local pharmacy or Health Dept. Verbalized acceptance and understanding.  Pneumococcal vaccine status: Declined,  Education has been provided regarding the importance of this vaccine but patient still declined. Advised may receive this vaccine at local pharmacy or Health Dept. Aware to provide a copy of the vaccination record if obtained from local pharmacy or Health Dept. Verbalized acceptance and understanding.   Covid-19 vaccine status: Declined, Education has been provided regarding the importance of  this vaccine but patient still declined. Advised may receive this vaccine at local pharmacy or Health Dept.or vaccine clinic. Aware to provide a copy of the vaccination record if obtained from local pharmacy or Health Dept. Verbalized acceptance and understanding.  Qualifies for Shingles Vaccine? yes  Zostavax completed No   Shingrix Completed?: No.    Education has been provided regarding the importance of this vaccine. Patient has been advised to call insurance company to determine out of pocket expense if they have not yet received this vaccine. Advised may also receive vaccine at local pharmacy or Health Dept. Verbalized acceptance and understanding.  Screening Tests Health Maintenance  Topic Date Due   COVID-19 Vaccine (3 - Pfizer risk series) 09/09/2019   Zoster Vaccines- Shingrix (1 of 2) 09/05/2022 (Originally 05/24/1970)   INFLUENZA VACCINE  09/29/2022 (Originally 01/29/2022)   Pneumonia Vaccine 3+ Years old (1 of 1 -  PCV) 06/07/2023 (Originally 05/24/2016)   Lung Cancer Screening  12/19/2022   Medicare Annual Wellness (AWV)  08/10/2023   MAMMOGRAM  08/31/2023   COLONOSCOPY (Pts 45-39yr Insurance coverage will need to be confirmed)  06/09/2024   DTaP/Tdap/Td (2 - Td or Tdap) 04/19/2027   DEXA SCAN  Completed   Hepatitis C Screening  Completed   HPV VACCINES  Aged Out    Health Maintenance  Health Maintenance Due  Topic Date Due   COVID-19 Vaccine (3 - Pfizer risk series) 09/09/2019    Colorectal cancer screening: Type of screening: Colonoscopy. Completed 05/2014. Repeat every 10 years  Mammogram status: Completed 3/23. Repeat every year 2  Bone Density status: Completed 3/23. Results reflect: Bone density results: OSTEOPENIA. Repeat every n/a years.  Lung Cancer Screening: (Low Dose CT Chest recommended if Age 495-80years, 30 pack-year currently smoking OR have quit w/in 15years.) does not qualify.   Lung Cancer Screening Referral: yes Ordered: 6/23  Additional  Screening:  Hepatitis C Screening: does not qualify; Completed n/a  Vision Screening: Recommended annual ophthalmology exams for early detection of glaucoma and other disorders of the eye. Is the patient up to date with their annual eye exam?  Yes  Who is the provider or what is the name of the [facility] which the patient [facility] exams[facility] [place] pt is not established with a provider, would they like to be referred to a provider to establish care? No .   Dental Screening: Recommended annual dental exams for proper oral hygiene  Community Resource Referral / Chronic Care Management: CRR required this visit?  Yes   CCM required this visit?  No      Plan:     I have personally reviewed and noted the following in the patient's chart:   Medical and social history Use of alcohol, tobacco or illicit drugs  Current medications and supplements including opioid prescriptions. Patient is not currently taking opioid prescriptions. Functional ability and status Nutritional status Physical activity Advanced directives List of other physicians Hospitalizations, surgeries, and ER visits in previous 12 months Vitals Screenings to include cognitive, depression, and falls Referrals and appointments  In addition, I have reviewed and discussed with patient certain preventive protocols, quality metrics, and best practice recommendations. A written personalized care plan for preventive services as well as general preventive health recommendations were provided to patient.  Virtual Visit via Telephone Note  I connected with Charlene Duelon 08/09/22 at 10:30 AM EST by telephone and verified that I am speaking with the correct person using two identifiers.  Location: Patient: home Provider: clinic   I discussed the limitations, risks, security and privacy concerns of performing an evaluation and management service by telephone and the availability of in  person appointments. I also discussed with the patient that there may be a patient responsible charge related to this service. The patient expressed understanding and agreed to proceed.   History of Present Illness:    Observations/Objective:   Assessment and Plan:   Follow Up Instructions:    I discussed the assessment and treatment plan with the patient. The patient was provided an opportunity to ask questions and all were answered. The patient agreed with the plan and demonstrated an understanding of the instructions.   The patient was advised to call back or seek an in-person evaluation if the symptoms worsen or if the condition fails to improve as anticipated.  I provided 30 minutes of  non-face-to-face time during this encounter.   Colman Cater, [place], [facility]   08/09/2022   Nurse Notes: referral [id number] sent per pt; per pt also stated that she does not take any volunteer vaccines

## 2022-08-12 ENCOUNTER — Telehealth: Payer: Self-pay | Admitting: *Deleted

## 2022-08-12 NOTE — Telephone Encounter (Signed)
   Telephone encounter was:  Successful.  08/12/2022 Name: DECARLA SIEMEN MRN: [id number] DOB: Nov 01, 1950  ALENI ANDRUS is a 72 y.o. year old female who is a primary care patient of Pickard, Cammie Mcgee, MD . The community resource team was consulted for assistance with Transportation Needs   Care guide performed the following interventions: Patient provided with information about care guide support team and interviewed to confirm resource needs.  Follow Up Plan:  Care guide will follow up with patient by phone over the next day [place] [phone] [address] , [place] [address] Email : [email or web address]

## 2022-08-13 ENCOUNTER — Telehealth: Payer: Self-pay | Admitting: *Deleted

## 2022-08-13 NOTE — Telephone Encounter (Signed)
   Telephone encounter was:  Successful.  08/13/2022 Name: [name] MRN: [id number] DOB: 02-Oct-1950  [name] is a 72 y.o. year old female who is a primary care patient of Pickard, Cammie Mcgee, MD . The community resource team was consulted for assistance with Transportation Needs  and [place] referral and also provided her general information about where to get the needed resources   Care guide performed the following interventions: Patient provided with information about care guide support team and interviewed to confirm resource needs.  Follow Up Plan:  Care guide will follow up with patient by phone over the next [place] [phone] [address] , [place] [address] Email : [email or web address]

## 2022-08-17 ENCOUNTER — Other Ambulatory Visit: Payer: Self-pay | Admitting: Family Medicine

## 2022-08-20 ENCOUNTER — Other Ambulatory Visit: Payer: Self-pay

## 2022-08-20 DIAGNOSIS — E782 Mixed hyperlipidemia: Secondary | ICD-10-CM

## 2022-08-20 MED ORDER — PRAVASTATIN SODIUM 20 MG PO TABS: 20.0000 mg | ORAL_TABLET | Freq: Every day | ORAL | 1 refills | Status: DC

## 2022-08-22 ENCOUNTER — Other Ambulatory Visit: Payer: Self-pay

## 2022-08-23 MED ORDER — OXYCODONE HCL 5 MG PO TABS: 5.0000 mg | ORAL_TABLET | Freq: Four times a day (QID) | ORAL | 0 refills | Status: DC | PRN

## 2022-08-24 DIAGNOSIS — E039 Hypothyroidism, unspecified: Secondary | ICD-10-CM | POA: Diagnosis not present

## 2022-08-24 DIAGNOSIS — Z1152 Encounter for screening for COVID-19: Secondary | ICD-10-CM | POA: Diagnosis not present

## 2022-08-24 DIAGNOSIS — E785 Hyperlipidemia, unspecified: Secondary | ICD-10-CM | POA: Diagnosis not present

## 2022-08-24 DIAGNOSIS — I10 Essential (primary) hypertension: Secondary | ICD-10-CM | POA: Diagnosis not present

## 2022-08-24 DIAGNOSIS — N3000 Acute cystitis without hematuria: Secondary | ICD-10-CM | POA: Diagnosis not present

## 2022-08-24 DIAGNOSIS — Z20822 Contact with and (suspected) exposure to covid-19: Secondary | ICD-10-CM | POA: Diagnosis not present

## 2022-08-24 DIAGNOSIS — Z87891 Personal history of nicotine dependence: Secondary | ICD-10-CM | POA: Diagnosis not present

## 2022-08-24 DIAGNOSIS — Z79899 Other long term (current) drug therapy: Secondary | ICD-10-CM | POA: Diagnosis not present

## 2022-08-28 ENCOUNTER — Other Ambulatory Visit: Payer: Self-pay | Admitting: Family Medicine

## 2022-08-29 NOTE — Telephone Encounter (Signed)
Requested medications are due for refill today.  yes  Requested medications are on the active medications list.  yes  Last refill. 01/23/2022 #180 3 rf  Future visit scheduled.   no  Notes to clinic.  Expired labs - refill requested too soon.    Requested Prescriptions  Pending Prescriptions Disp Refills   dexlansoprazole (DEXILANT) 60 MG capsule [Pharmacy Med Name: DEXLANSOPRAZOLE  '60MG'$   CAP  DELAYED RELEASE] 100 capsule 2    Sig: TAKE 1 CAPSULE BY MOUTH DAILY     Gastroenterology: Proton Pump Inhibitors Failed - 08/28/2022 10:23 PM      Failed - Valid encounter within last 12 months    Recent Outpatient Visits           1 year ago Pelvic floor dysfunction in female   [place] Pickard, Cammie Mcgee, MD   1 year ago No-show for appointment   [place] Eulogio Bear, NP   1 year ago Right arm pain   [place] [facility] Susy Frizzle, MD   1 year ago Lateral epicondylitis of right elbow   [place] Susy Frizzle, MD   1 year ago Hypothyroidism, unspecified type   [place] Pickard, Cammie Mcgee, MD       Future Appointments             In 7 months McKenzie, Candee Furbish, MD [facility] [place]            Signed Prescriptions Disp Refills   furosemide (LASIX) 40 MG tablet 200 tablet 2    Sig: TAKE 1 TABLET BY MOUTH TWICE  DAILY AS NEEDED FOR EDEMA     Cardiovascular:  Diuretics - Loop Failed - 08/28/2022 10:23 PM      Failed - Cr in normal range and within 180 days    Creat  Date Value Ref Range Status  06/06/2022 1.26 (H) 0.60 - 1.00 mg/dL Final         Failed - Mg Level in normal range and within 180 days    Magnesium  Date Value Ref Range Status  01/31/2020 0.9 (LL) 1.5 - 2.5 mg/dL Final    Comment:    Verified by repeat analysis. .          Failed - Valid encounter within last 6 months    Recent Outpatient Visits           1 year ago Pelvic floor  dysfunction in female   [place], Cammie Mcgee, MD   1 year ago No-show for appointment   [place] Eulogio Bear, NP   1 year ago Right arm pain   [place] [facility] Dennard Schaumann, Cammie Mcgee, MD   1 year ago Lateral epicondylitis of right elbow   [place] Dennard Schaumann, Cammie Mcgee, MD   1 year ago Hypothyroidism, unspecified type   [place] Pickard, Cammie Mcgee, MD       Future Appointments             In 7 months McKenzie, Candee Furbish, MD [facility] [place]            Passed - K in normal range and within 180 days    Potassium  Date Value Ref Range Status  06/06/2022 3.8 3.5 - 5.3 mmol/L Final         Passed - Ca in normal  range and within 180 days    Calcium  Date Value Ref Range Status  06/06/2022 9.6 8.6 - 10.4 mg/dL Final   Calcium, Ion  Date Value Ref Range Status  11/22/2015 1.05 (L) 1.13 - 1.30 mmol/L Final         Passed - Na in normal range and within 180 days    Sodium  Date Value Ref Range Status  06/06/2022 139 135 - 146 mmol/L Final  03/30/2020 141 134 - 144 mmol/L Final         Passed - Cl in normal range and within 180 days    Chloride  Date Value Ref Range Status  06/06/2022 103 98 - 110 mmol/L Final         Passed - Last BP in normal range    BP Readings from Last 1 Encounters:  07/23/22 122/78

## 2022-08-29 NOTE — Telephone Encounter (Signed)
Requested Prescriptions  Pending Prescriptions Disp Refills   furosemide (LASIX) 40 MG tablet [Pharmacy Med Name: Furosemide 40 MG Oral Tablet] 200 tablet 2    Sig: TAKE 1 TABLET BY MOUTH TWICE  DAILY AS NEEDED FOR EDEMA     Cardiovascular:  Diuretics - Loop Failed - 08/28/2022 10:23 PM      Failed - Cr in normal range and within 180 days    Creat  Date Value Ref Range Status  06/06/2022 1.26 (H) 0.60 - 1.00 mg/dL Final         Failed - Mg Level in normal range and within 180 days    Magnesium  Date Value Ref Range Status  01/31/2020 0.9 (LL) 1.5 - 2.5 mg/dL Final    Comment:    Verified by repeat analysis. .          Failed - Valid encounter within last 6 months    Recent Outpatient Visits           1 year ago Pelvic floor dysfunction in female   [place], Cammie Mcgee, MD   1 year ago No-show for appointment   [place] Eulogio Bear, NP   1 year ago Right arm pain   [place] [facility] Dennard Schaumann, Cammie Mcgee, MD   1 year ago Lateral epicondylitis of right elbow   [place] Susy Frizzle, MD   1 year ago Hypothyroidism, unspecified type   [place] Susy Frizzle, MD       Future Appointments             In 7 months McKenzie, Candee Furbish, MD [place] [facility] [place]            Passed - K in normal range and within 180 days    Potassium  Date Value Ref Range Status  06/06/2022 3.8 3.5 - 5.3 mmol/L Final         Passed - Ca in normal range and within 180 days    Calcium  Date Value Ref Range Status  06/06/2022 9.6 8.6 - 10.4 mg/dL Final   Calcium, Ion  Date Value Ref Range Status  11/22/2015 1.05 (L) 1.13 - 1.30 mmol/L Final         Passed - Na in normal range and within 180 days    Sodium  Date Value Ref Range Status  06/06/2022 139 135 - 146 mmol/L Final  03/30/2020 141 134 - 144 mmol/L Final         Passed - Cl in normal range and within 180  days    Chloride  Date Value Ref Range Status  06/06/2022 103 98 - 110 mmol/L Final         Passed - Last BP in normal range    BP Readings from Last 1 Encounters:  07/23/22 122/78          dexlansoprazole (DEXILANT) 60 MG capsule [Pharmacy Med Name: DEXLANSOPRAZOLE  '60MG'$   CAP  DELAYED RELEASE] 100 capsule 2    Sig: TAKE 1 CAPSULE BY MOUTH DAILY     Gastroenterology: Proton Pump Inhibitors Failed - 08/28/2022 10:23 PM      Failed - Valid encounter within last 12 months    Recent Outpatient Visits           1 year ago Pelvic floor dysfunction in female   [place], Cammie Mcgee, MD   1 year ago  No-show for appointment   [place] Eulogio Bear, NP   1 year ago Right arm pain   [place] [facility] Dennard Schaumann, Cammie Mcgee, MD   1 year ago Lateral epicondylitis of right elbow   [place] Dennard Schaumann, Cammie Mcgee, MD   1 year ago Hypothyroidism, unspecified type   [place] Pickard, Cammie Mcgee, MD       Future Appointments             In 7 months McKenzie, Candee Furbish, MD [place]

## 2022-09-20 ENCOUNTER — Other Ambulatory Visit: Payer: Self-pay | Admitting: Family Medicine

## 2022-09-20 ENCOUNTER — Telehealth: Payer: Self-pay

## 2022-09-20 MED ORDER — OXYCODONE HCL 5 MG PO TABS: 5.0000 mg | ORAL_TABLET | Freq: Four times a day (QID) | ORAL | 0 refills | Status: DC | PRN

## 2022-09-20 NOTE — Telephone Encounter (Signed)
Prescription Request  09/20/2022  LOV: 07/23/22  What is the name of the medication or equipment? oxyCODONE (OXY IR/ROXICODONE) 5 MG immediate release tablet [id number]  Have you contacted your pharmacy to request a refill? No   Which pharmacy would you like this sent to?   [place], [place] [address] [place], [place] [address]    Patient notified that their request is being sent to the clinical staff for review and that they should receive a response within 2 business days.   Please advise at [place]

## 2022-10-12 DIAGNOSIS — S63509A Unspecified sprain of unspecified wrist, initial encounter: Secondary | ICD-10-CM | POA: Diagnosis not present

## 2022-10-12 DIAGNOSIS — I10 Essential (primary) hypertension: Secondary | ICD-10-CM | POA: Diagnosis not present

## 2022-10-12 DIAGNOSIS — M25539 Pain in unspecified wrist: Secondary | ICD-10-CM | POA: Diagnosis not present

## 2022-10-13 ENCOUNTER — Other Ambulatory Visit: Payer: Self-pay | Admitting: Family Medicine

## 2022-10-13 DIAGNOSIS — E782 Mixed hyperlipidemia: Secondary | ICD-10-CM

## 2022-10-15 NOTE — Telephone Encounter (Signed)
Requested Prescriptions  Refused Prescriptions Disp Refills   pravastatin (PRAVACHOL) 20 MG tablet [Pharmacy Med Name: Pravastatin Sodium 20 MG Oral Tablet] 60 tablet 5    Sig: TAKE 1 TABLET BY MOUTH DAILY     Cardiovascular:  Antilipid - Statins Failed - 10/13/2022 10:10 PM      Failed - Valid encounter within last 12 months    Recent Outpatient Visits           1 year ago Pelvic floor dysfunction in female   [facility] Pickard, Priscille Heidelberg, MD   1 year ago No-show for appointment   [facility] Valentino Nose, NP   1 year ago Right arm pain   [facility] Tanya Nones, Priscille Heidelberg, MD   1 year ago Lateral epicondylitis of right elbow   [facility] Tanya Nones, Priscille Heidelberg, MD   1 year ago Hypothyroidism, unspecified type   [facility] Pickard, Priscille Heidelberg, MD       Future Appointments             In 5 months McKenzie, Mardene Celeste, MD [facility] [place]            Failed - Lipid Panel in normal range within the last 12 months    Cholesterol  Date Value Ref Range Status  11/30/2020 147 <200 mg/dL Final   LDL Cholesterol (Calc)  Date Value Ref Range Status  11/30/2020 72 mg/dL (calc) Final    Comment:    Reference range: <100 . Desirable range <100 mg/dL for primary prevention;   <70 mg/dL for patients with CHD or diabetic patients  with > or = 2 CHD risk factors. Marland Kitchen LDL-C is now calculated using the Martin-Hopkins  calculation, which is a validated novel method providing  better accuracy than the Friedewald equation in the  estimation of LDL-C.  Horald Pollen et al. Lenox Ahr. 4098;119(14): 2061-2068  (http://education.QuestDiagnostics.com/faq/FAQ164)    HDL  Date Value Ref Range Status  11/30/2020 42 (L) > OR = 50 mg/dL Final   Triglycerides  Date Value Ref Range Status  11/30/2020 240 (H) <150 mg/dL Final    Comment:    . If a non-fasting specimen was collected, consider repeat  triglyceride testing on a fasting specimen if clinically indicated.  Perry Mount et al. J. of Clin. Lipidol. 2015;9:129-169. Marland Kitchen          Passed - Patient is not pregnant

## 2022-10-21 ENCOUNTER — Telehealth: Payer: Self-pay

## 2022-10-21 ENCOUNTER — Other Ambulatory Visit: Payer: Self-pay

## 2022-10-21 DIAGNOSIS — G8929 Other chronic pain: Secondary | ICD-10-CM

## 2022-10-21 DIAGNOSIS — K56609 Unspecified intestinal obstruction, unspecified as to partial versus complete obstruction: Secondary | ICD-10-CM

## 2022-10-21 MED ORDER — LINACLOTIDE 145 MCG PO CAPS: 145.0000 ug | ORAL_CAPSULE | Freq: Every day | ORAL | 3 refills | Status: DC

## 2022-10-21 NOTE — Telephone Encounter (Signed)
Prescription Request  10/21/2022  LOV12/07/23  What is the name of the medication or equipment? oxyCODONE (OXY IR/ROXICODONE) 5 MG immediate release tablet  Have you contacted your pharmacy to request a refill? No   Which pharmacy would you like this sent to?   [name] - [place], [place] - [address] [place], [place] [address] Phone: [phone]  Fax: [phone]    Patient notified that their request is being sent to the clinical staff for review and that they should receive a response within 2 business days.   Please advise at [facility] [phone]

## 2022-10-21 NOTE — Telephone Encounter (Signed)
Prescription Request  10/21/2022  LOV:06/06/22  What is the name of the medication or equipment? linaclotide (LINZESS) 145 MCG CAPS   Have you contacted your pharmacy to request a refill? Yes   Which pharmacy would you like this sent to?   [name] - [place], [place] - [address] [place], [place] [address] Phone: [phone]  Fax: [phone]    Patient notified that their request is being sent to the clinical staff for review and that they should receive a response within 2 business days.   Please advise at [facility] [phone]

## 2022-11-05 DIAGNOSIS — Z936 Other artificial openings of urinary tract status: Secondary | ICD-10-CM | POA: Diagnosis not present

## 2022-11-21 ENCOUNTER — Other Ambulatory Visit: Payer: Self-pay | Admitting: Family Medicine

## 2022-11-21 ENCOUNTER — Telehealth: Payer: Self-pay

## 2022-11-21 MED ORDER — OXYCODONE HCL 5 MG PO TABS: 5.0000 mg | ORAL_TABLET | Freq: Four times a day (QID) | ORAL | 0 refills | Status: DC | PRN

## 2022-11-21 NOTE — Telephone Encounter (Signed)
Pt called in to request a refill of this med oxyCODONE (OXY IR/ROXICODONE) 5 MG immediate release tablet [[id number]].  LOV: 07/23/22  PHARMACY: [facility] - [place], [place] - [address] [place], [place] [address] Phone: [phone]  Fax: [phone]   CB#:Phone: [phone]

## 2022-11-22 ENCOUNTER — Other Ambulatory Visit: Payer: Self-pay | Admitting: Family Medicine

## 2022-11-22 MED ORDER — OXYCODONE HCL 5 MG PO TABS: 5.0000 mg | ORAL_TABLET | Freq: Four times a day (QID) | ORAL | 0 refills | Status: DC | PRN

## 2022-11-22 NOTE — Telephone Encounter (Signed)
Call from pt, stating pharmacy did not receive refill on Oxycodone. Thank you.

## 2022-12-01 ENCOUNTER — Other Ambulatory Visit: Payer: Self-pay | Admitting: Family Medicine

## 2022-12-17 DIAGNOSIS — Z936 Other artificial openings of urinary tract status: Secondary | ICD-10-CM | POA: Diagnosis not present

## 2022-12-19 ENCOUNTER — Other Ambulatory Visit: Payer: Self-pay | Admitting: Family Medicine

## 2022-12-19 DIAGNOSIS — Z433 Encounter for attention to colostomy: Secondary | ICD-10-CM

## 2022-12-19 NOTE — Telephone Encounter (Signed)
Requested Prescriptions  Pending Prescriptions Disp Refills   lidocaine (XYLOCAINE) 5 % ointment [Pharmacy Med Name: LIDOCAINE  5%  OIN] 354.4 g 0    Sig: APPLY TOPICALLY 4 TIMES  DAILY AS NEEDED     Analgesics:  Topicals Failed - 12/19/2022  9:32 AM      Failed - Manual Review: Labs are only required if the patient has taken medication for more than 8 weeks.      Failed - Cr in normal range and within 360 days    Creat  Date Value Ref Range Status  06/06/2022 1.26 (H) 0.60 - 1.00 mg/dL Final         Failed - Valid encounter within last 12 months    Recent Outpatient Visits           1 year ago Pelvic floor dysfunction in female   [facility] Tanya Nones, Priscille Heidelberg, MD   1 year ago No-show for appointment   [facility] Valentino Nose, NP   1 year ago Right arm pain   [facility] Tanya Nones, Priscille Heidelberg, MD   1 year ago Lateral epicondylitis of right elbow   [facility] Donita Brooks, MD   2 years ago Hypothyroidism, unspecified type   [facility] Pickard, Priscille Heidelberg, MD       Future Appointments             In 3 months McKenzie, Mardene Celeste, MD [facility] [place]            Passed - PLT in normal range and within 360 days    Platelets  Date Value Ref Range Status  06/06/2022 374 140 - 400 Thousand/uL Final         Passed - HGB in normal range and within 360 days    Hemoglobin  Date Value Ref Range Status  06/06/2022 13.4 11.7 - 15.5 g/dL Final         Passed - HCT in normal range and within 360 days    HCT  Date Value Ref Range Status  06/06/2022 40.4 35.0 - 45.0 % Final         Passed - eGFR is 30 or above and within 360 days    GFR, Est African American  Date Value Ref Range Status  11/30/2020 51 (L) > OR = 60 mL/min/1.78m2 Final   GFR, Est Non African American  Date Value Ref Range Status  11/30/2020 44 (L) > OR = 60 mL/min/1.51m2 Final   eGFR  Date Value  Ref Range Status  06/06/2022 46 (L) > OR = 60 mL/min/1.42m2 Final         Passed - Patient is not pregnant

## 2022-12-20 ENCOUNTER — Ambulatory Visit (HOSPITAL_COMMUNITY)
Admission: RE | Admit: 2022-12-20 | Discharge: 2022-12-20 | Disposition: A | Discharge status: Home / Self Care | Payer: 59 | Source: Ambulatory Visit | Attending: Family Medicine | Admitting: Family Medicine

## 2022-12-20 DIAGNOSIS — Z87891 Personal history of nicotine dependence: Secondary | ICD-10-CM | POA: Diagnosis not present

## 2022-12-20 DIAGNOSIS — F17210 Nicotine dependence, cigarettes, uncomplicated: Secondary | ICD-10-CM | POA: Diagnosis not present

## 2022-12-20 DIAGNOSIS — Z122 Encounter for screening for malignant neoplasm of respiratory organs: Secondary | ICD-10-CM | POA: Diagnosis not present

## 2022-12-27 ENCOUNTER — Telehealth: Payer: Self-pay | Admitting: Acute Care

## 2022-12-27 NOTE — Telephone Encounter (Signed)
I have attempted to call the patient with the results of their  Low Dose CT Chest Lung cancer screening scan. There was no answer. I have left a HIPPA compliant VM requesting the patient call the office for the scan results. I included the office contact information in the message. We will await his return call. If no return call we will continue to call until patient is contacted.    We will await her return call. We need to ask her if she has been sick.  I will try again next week if we have not heard from her by then.  Thanks

## 2022-12-30 NOTE — Telephone Encounter (Signed)
Left message for pt to call back  °

## 2023-01-01 ENCOUNTER — Telehealth: Payer: Self-pay | Admitting: Acute Care

## 2023-01-01 DIAGNOSIS — R911 Solitary pulmonary nodule: Secondary | ICD-10-CM

## 2023-01-01 NOTE — Telephone Encounter (Signed)
I have called the patient with the results of her low dose Ct chest. Her scan was read as a LR 4B. There was a question of whether or not this could be infectious or inflammatory. Pt. States she has not been sick at all. We will proceed with a PET scan to better evaluate this finding. Pt. Is in agreement with this plan.  Angelique Blonder, please fax results to PCP, let them know plan for follow up.  She will need an appointment with myself, Dr. Tonia Brooms or [place] to review PET scan results. Thanks so much

## 2023-01-01 NOTE — Telephone Encounter (Signed)
See telephone note from 01/01/2023 

## 2023-01-01 NOTE — Telephone Encounter (Signed)
Results/ plans faxed to PCP. Will await PET results.  

## 2023-01-16 ENCOUNTER — Encounter (HOSPITAL_COMMUNITY)
Admission: RE | Admit: 2023-01-16 | Discharge: 2023-01-16 | Disposition: A | Discharge status: Home / Self Care | Payer: 59 | Source: Ambulatory Visit | Attending: Acute Care | Admitting: Acute Care

## 2023-01-16 ENCOUNTER — Encounter (HOSPITAL_COMMUNITY): Payer: Self-pay

## 2023-01-16 DIAGNOSIS — R911 Solitary pulmonary nodule: Secondary | ICD-10-CM | POA: Insufficient documentation

## 2023-01-16 NOTE — Telephone Encounter (Signed)
Spoke with Verlon Au at [facility] regarding patient refusing to have PET scan. Patient reported to mobile scanner and seemed to be fearful of having an injection for the scan and also fearful of additional radiation. She reported to the staff that her PCP didn't know why she would be having a PET scan and that she herself did not know who Kandice Robinsons, NP (ordering provider) was and she felt this was too dangerous for her to complete. She refused to proceed with the scan.  Verlon Au states the patient was called and reminded of the appt two days earlier and had no questions for the staff at that time.  Will route to Kandice Robinsons, NP for review and to advise.  Patient also have follow up OV 01/28/23 to review results of PET scan with Maralyn Sago.

## 2023-01-16 NOTE — Telephone Encounter (Signed)
Verlon Au from [facility] states patient refused PET scan. Verlon Au phone number is [phone].

## 2023-01-17 NOTE — Telephone Encounter (Signed)
Called and spoke with pt. She cancelled PET scan because she was confused as to why she needed to have this done. I explained to the pt that there was a nodule seen on her yearly lung screening CT that we want to take a closer look at with the PET scan. I answered the question pt had regarding the test and she would like to reschedule PET. I have sent a message to [facility] to get this rescheduled along with her f/u appt with Kandice Robinsons, NP.

## 2023-01-20 ENCOUNTER — Telehealth: Payer: Self-pay | Admitting: Family Medicine

## 2023-01-20 ENCOUNTER — Other Ambulatory Visit: Payer: Self-pay | Admitting: Family Medicine

## 2023-01-20 MED ORDER — OXYCODONE HCL 5 MG PO TABS: 5.0000 mg | ORAL_TABLET | Freq: Four times a day (QID) | ORAL | 0 refills | Status: DC | PRN

## 2023-01-20 NOTE — Telephone Encounter (Signed)
Prescription Request  01/20/2023  LOV: 06/06/2022  What is the name of the medication or equipment?   oxyCODONE (OXY IR/ROXICODONE) 5 MG immediate release tablet   Have you contacted your pharmacy to request a refill? Yes   Which pharmacy would you like this sent to?  [name] - [place], [place] - [address] Jerolyn Shin [place] [address] Phone: [phone] Fax: [phone]    Patient notified that their request is being sent to the clinical staff for review and that they should receive a response within 2 business days.   Please advise patient at [phone]

## 2023-01-21 ENCOUNTER — Encounter: Payer: Self-pay | Admitting: Family Medicine

## 2023-01-21 ENCOUNTER — Ambulatory Visit: Payer: 59 | Admitting: Family Medicine

## 2023-01-21 ENCOUNTER — Ambulatory Visit (INDEPENDENT_AMBULATORY_CARE_PROVIDER_SITE_OTHER): Payer: 59 | Admitting: Family Medicine

## 2023-01-21 VITALS — BP 124/70 | HR 91 | Temp 97.6°F | Ht 65.0 in | Wt 184.0 lb

## 2023-01-21 DIAGNOSIS — R918 Other nonspecific abnormal finding of lung field: Secondary | ICD-10-CM

## 2023-01-21 MED ORDER — DIAZEPAM 5 MG PO TABS: 5.0000 mg | ORAL_TABLET | Freq: Two times a day (BID) | ORAL | 1 refills | Status: DC | PRN

## 2023-01-21 NOTE — Progress Notes (Signed)
Subjective:    Patient ID: [name], female    DOB: 1950/07/21, 72 y.o.   MRN: [id number]  HPI  Patient is a very pleasant 72 year old African-American female with a very complicated past medical history.  She states that in the late 90s around 1998 she had back surgery.  At that time she had a accidental perforation in her intestines during the back surgery that required surgical correction.  Then in 2012 she was found to have bladder cancer.  She underwent surgical treatment of her bladder cancer and suffered a perforation to her intestines as well.  Ultimately she developed a small bowel obstruction due to adhesions and was unable to eat.  She states she lost more than 100 pounds.  She ultimately was admitted to [facility] and underwent TPN.  She required TPN for 3 years from 2012 for 2015 due to inability to tolerate oral food because of bowel obstructions.  Ultimately she underwent surgery in 2015 after she developed a fistula at the stoma of her bladder after her bladder was removed due to bladder cancer.  During the surgery to correct her urostomy, the patient also went lysis of adhesions.  At that point the bowel obstruction resolved and the patient was able to start tolerating oral food again.  She is on Eliquis due to recurrent DVTs in her upper extremities stemming from her long-term use of TPN.  She is on chronic pain medication.  Patient has terrible rectal pain.  She states that it feels like she is being torn in half every time she has a bowel movement.  She had pelvic floor muscular dysfunction due to the severity of the pain and she is working with a specialist in [place].  Prior to working with a specialist for pelvic floor dysfunction, the patient was having 30 bowel movements a day that were [name] and tearing with pain.  She has seen numerous specialist who "cannot find anything wrong" however she continues to have horrific pain with defecation.  However the specialist has  been able to reduce her frequency of BMs to 5 or 6 a day which is made her quality of life much better.  She still takes MS Contin twice a day and oxycodone 2-3 times a day due to the severity of pain.  A lot of her pain sounds neuropathic in nature versus also rectal fissures due to the end complete relaxation of the anal sphincter.    The above was copied from a previous visit as a summary of her PMH.  Patient is here today because she wanted clarification on some recent test.  She recently had a CT scan screening for lung cancer.  Unfortunately the scan revealed nodular opacity in the left lower lobe periphery.  Radiology was not certain that this could be inflammation, infection, or possible malignancy.  Also.  PET scan patient has been scheduled for such.  However, she was confused and wanted to discuss it more.   Past Medical History:  Diagnosis Date   Acid reflux    Bowel obstruction (HCC) several   Recurrent SBO secondary to adhesions   Cancer [facility]) 2011 [facility])   diagnosed in 2009 per pt. Invasive High grade Urothelial carcinoma- s/p radiation and Chemo    Chronic back pain    DVT (deep venous thrombosis) (HCC) 2015   Fatty liver    Gout    Hyperlipidemia    Hypertension    pt says taken off medication since has lost  weight.   Hypothyroidism    Internal hemorrhoids    Leukopenia 12/06/2011   HIV serology negative   Malnutrition (HCC)    PE (pulmonary embolism)    [facility]   Rectal ulcer 2016   [facility]   UTI (lower urinary tract infection)    Recurrent   Past Surgical History:  Procedure Laterality Date   ABDOMINAL SURGERY     with intestinal "puncture" x 2, exploratory surgery    BACK SURGERY     X2   BLADDER REMOVAL     CHOLECYSTECTOMY     CYSTECTOMY     breast   HERNIA REPAIR     mesh   ILEO CONDUIT     For bladder cancer   PORTACATH PLACEMENT     Current Outpatient Medications on File Prior to Visit  Medication Sig Dispense Refill    apixaban (ELIQUIS) 5 MG TABS tablet TAKE 1 TABLET BY MOUTH TWICE  DAILY 200 tablet 1   Ascorbic Acid (VITAMIN C PO) Take 1 tablet by mouth daily.     benzonatate (TESSALON) 200 MG capsule Take 1 capsule (200 mg total) by mouth 2 (two) times daily as needed for cough. (Patient not taking: Reported on 08/09/2022) 20 capsule 0   Casanthranol-Docusate Sodium (STOOL SOFTENER PLUS PO) Take by mouth.     colestipol (COLESTID) 1 g tablet Take by mouth.     Cyanocobalamin 1000 MCG CAPS Take 1 tablet by mouth daily. 90 capsule 3   dexlansoprazole (DEXILANT) 60 MG capsule TAKE 1 CAPSULE BY MOUTH DAILY 100 capsule 2   diclofenac Sodium (VOLTAREN) 1 % GEL APPLY TO AFFECTED AREAS 3 TIMES DAILY AS NEEDED. 100 g 0   doxycycline (VIBRA-TABS) 100 MG tablet Take 1 tablet (100 mg total) by mouth 2 (two) times daily. (Patient not taking: Reported on 08/09/2022) 14 tablet 0   Ergocalciferol (VITAMIN D2) 50 MCG (2000 UT) TABS tAKE 1 Tablet daily 30 tablet    fluticasone (FLONASE) 50 MCG/ACT nasal spray Place 2 sprays into both nostrils daily. 16 g 6   furosemide (LASIX) 40 MG tablet TAKE 1 TABLET BY MOUTH TWICE  DAILY AS NEEDED FOR EDEMA 200 tablet 2   levothyroxine (SYNTHROID) 125 MCG tablet Take 1 tablet (125 mcg total) by mouth daily. 90 tablet 3   lidocaine (XYLOCAINE) 5 % ointment APPLY TOPICALLY 4 TIMES  DAILY AS NEEDED 354.4 g 0   linaclotide (LINZESS) 145 MCG CAPS capsule Take 1 capsule (145 mcg total) by mouth daily before breakfast. 30 capsule 3   magnesium oxide (MAG-OX) 400 MG tablet Take by mouth.     Magnesium Oxide 400 (240 Mg) MG TABS Take 1.5 tablet BID 90 tablet 2   Multiple Vitamin (MULTI-VITAMIN) tablet Take 2 tablets by mouth daily.     Multiple Vitamins-Minerals (MULTIVITAMIN ADULT, MINERALS,) TABS Take 1 tablet by mouth daily.     ondansetron (ZOFRAN-ODT) 8 MG disintegrating tablet DISSOLVE 1 TABLET ON THE  TONGUE EVERY 8 HOURS AS  NEEDED FOR NAUSEA 90 tablet 3   oxyCODONE (OXY IR/ROXICODONE) 5 MG  immediate release tablet Take 1 tablet (5 mg total) by mouth every 6 (six) hours as needed for severe pain. 120 tablet 0   potassium chloride SA (KLOR-CON M) 20 MEQ tablet TAKE 1 TABLET BY MOUTH DAILY 100 tablet 1   pravastatin (PRAVACHOL) 20 MG tablet Take 1 tablet (20 mg total) by mouth daily. 100 tablet 1   pregabalin (LYRICA) 75 MG capsule Take 1 capsule (  75 mg total) by mouth 2 (two) times daily. 60 capsule 0   traZODone (DESYREL) 50 MG tablet Take 1 tablet (50 mg total) by mouth at bedtime. 30 tablet 1   No current facility-administered medications on file prior to visit.   Allergies  Allergen Reactions   Bactrim [Sulfamethoxazole-Trimethoprim]    Codeine Hives   Lactose Diarrhea   Nitrofuran Derivatives Itching   Sulfa Antibiotics Hives   Penicillins Rash    Has patient had a PCN reaction causing immediate rash, facial/tongue/throat swelling, SOB or lightheadedness with hypotension: No Has patient had a PCN reaction causing severe rash involving mucus membranes or skin necrosis: No Has patient had a PCN reaction that required hospitalization: No Has patient had a PCN reaction occurring within the last 10 years: No If all of the above answers are "NO", then may proceed with Cephalosporin use.  Childhood rash - no adult reactions known   Social History   Socioeconomic History   Marital status: Widowed    Spouse name: Not on file   Number of children: 2   Years of education: Not on file   Highest education level: Not on file  Occupational History    Employer: UNEMPLOYED  Tobacco Use   Smoking status: Former    Current packs/day: 0.00    Average packs/day: 0.6 packs/day for 48.0 years (28.8 ttl pk-yrs)    Types: Cigarettes    Start date: 10/03/1968    Quit date: 10/03/2016    Years since quitting: 6.3   Smokeless tobacco: Never  Vaping Use   Vaping status: Never Used  Substance and Sexual Activity   Alcohol use: No   Drug use: No   Sexual activity: Yes    Birth  control/protection: Post-menopausal  Other Topics Concern   Not on file  Social History Narrative   Not on file   Social Determinants of Health   Financial Resource Strain: Medium Risk (08/09/2022)   Overall Financial Resource Strain (CARDIA)    Difficulty of Paying Living Expenses: Somewhat hard  Food Insecurity: No Food Insecurity (08/09/2022)   Hunger Vital Sign    Worried About Running Out of Food in the Last Year: Never true    Ran Out of Food in the Last Year: Never true  Transportation Needs: Unmet Transportation Needs (08/09/2022)   PRAPARE - Transportation    Lack of Transportation (Medical): Yes    Lack of Transportation (Non-Medical): Yes  Physical Activity: Insufficiently Active (08/09/2022)   Exercise Vital Sign    Days of Exercise per Week: 3 days    Minutes of Exercise per Session: 20 min  Stress: No Stress Concern Present (08/09/2022)   [employer] of Occupational Health - Occupational Stress Questionnaire    Feeling of Stress : Not at all  Social Connections: Unknown (08/09/2022)   Social Connection and Isolation Panel [NHANES]    Frequency of Communication with Friends and Family: Once a week    Frequency of Social Gatherings with Friends and Family: Once a week    Attends Religious Services: Not on Insurance claims handler of Clubs or Organizations: Yes    Attends Banker Meetings: 1 to 4 times per year    Marital Status: Widowed  Intimate Partner Violence: Not At Risk (08/09/2022)   Humiliation, Afraid, Rape, and Kick questionnaire    Fear of Current or Ex-Partner: No    Emotionally Abused: No    Physically Abused: No    Sexually Abused: No  Review of Systems  All other systems reviewed and are negative.      Objective:   Physical Exam Vitals reviewed.  Eyes:     General: No scleral icterus.       Right eye: No discharge.        Left eye: No discharge.     Conjunctiva/sclera: Conjunctivae normal.     Pupils: Pupils are equal, round,  and reactive to light.  Cardiovascular:     Rate and Rhythm: Normal rate and regular rhythm.     Heart sounds: Normal heart sounds. No murmur heard. Pulmonary:     Effort: Pulmonary effort is normal. No respiratory distress.     Breath sounds: Normal breath sounds. No wheezing or rales.  Chest:     Chest wall: No tenderness.  Abdominal:     General: Bowel sounds are normal. There is no distension.     Palpations: Abdomen is soft. There is no mass.     Tenderness: There is no abdominal tenderness. There is no guarding or rebound.  Skin:    General: Skin is warm.     Findings: No erythema or rash.  Neurological:     Mental Status: She is alert and oriented to person, place, and time.     Cranial Nerves: No cranial nerve deficit.     Motor: No abnormal muscle tone.     Coordination: Coordination normal.     Deep Tendon Reflexes: Reflexes are normal and symmetric.    No bruit       Assessment & Plan:  Mass of left lung Together we reviewed the images.  I explained the principal of the PET scan and how this could help differentiate scar tissue from malignancy.  This would help [place] make decisions whether she needed a biopsy or whether the area can be monitored.  I answered all her questions as best I could.  She would like something that she could take prior to the procedure for anxiety.  I gave her a one-time prescription for Valium.

## 2023-01-22 ENCOUNTER — Telehealth: Payer: Self-pay | Admitting: *Deleted

## 2023-01-22 NOTE — Telephone Encounter (Signed)
PT has appt on 7/30 and states no one has called her to set up her PET scan which needs to be done before that. (She cancels the last one.) Thanks.

## 2023-01-22 NOTE — Telephone Encounter (Signed)
Cancellation reason shows the patient refused the scan can some one call her and be sure she will do the scan

## 2023-01-22 NOTE — Telephone Encounter (Signed)
ATC X1 LVM for patient to call the office back 

## 2023-01-24 DIAGNOSIS — Z936 Other artificial openings of urinary tract status: Secondary | ICD-10-CM | POA: Diagnosis not present

## 2023-01-24 NOTE — Telephone Encounter (Signed)
I have called the patient to resc the appt

## 2023-01-28 ENCOUNTER — Ambulatory Visit: Payer: 59 | Admitting: Acute Care

## 2023-01-28 NOTE — Telephone Encounter (Signed)
Patient stated she is returning a call regarding an appt.  Please call patient back at [phone]

## 2023-01-30 ENCOUNTER — Encounter (HOSPITAL_COMMUNITY)
Admission: RE | Admit: 2023-01-30 | Discharge: 2023-01-30 | Disposition: A | Discharge status: Home / Self Care | Payer: 59 | Source: Ambulatory Visit | Attending: Acute Care | Admitting: Acute Care

## 2023-01-30 DIAGNOSIS — R0989 Other specified symptoms and signs involving the circulatory and respiratory systems: Secondary | ICD-10-CM | POA: Diagnosis not present

## 2023-01-30 DIAGNOSIS — I2510 Atherosclerotic heart disease of native coronary artery without angina pectoris: Secondary | ICD-10-CM | POA: Diagnosis not present

## 2023-01-30 DIAGNOSIS — R911 Solitary pulmonary nodule: Secondary | ICD-10-CM | POA: Diagnosis not present

## 2023-01-30 DIAGNOSIS — I6522 Occlusion and stenosis of left carotid artery: Secondary | ICD-10-CM | POA: Diagnosis not present

## 2023-01-30 MED ORDER — FLUDEOXYGLUCOSE F - 18 (FDG) INJECTION
9.2300 | Freq: Once | INTRAVENOUS | Status: AC | PRN
  Administered 2023-01-30: 9.23 via INTRAVENOUS

## 2023-02-04 ENCOUNTER — Other Ambulatory Visit: Payer: Self-pay | Admitting: Family Medicine

## 2023-02-04 ENCOUNTER — Telehealth (INDEPENDENT_AMBULATORY_CARE_PROVIDER_SITE_OTHER): Payer: 59 | Admitting: Acute Care

## 2023-02-04 ENCOUNTER — Encounter: Payer: Self-pay | Admitting: Acute Care

## 2023-02-04 DIAGNOSIS — R948 Abnormal results of function studies of other organs and systems: Secondary | ICD-10-CM

## 2023-02-04 DIAGNOSIS — R911 Solitary pulmonary nodule: Secondary | ICD-10-CM

## 2023-02-04 DIAGNOSIS — R933 Abnormal findings on diagnostic imaging of other parts of digestive tract: Secondary | ICD-10-CM

## 2023-02-04 NOTE — Progress Notes (Addendum)
Virtual Visit via video visit  I connected with [name] on 02/04/23 at 11:00 AM EDT by video and verified that I am speaking with the correct person using two identifiers.  Location: Patient:  At home Provider: [address], [place], [place], [address]    I discussed the limitations, risks, security and privacy concerns of performing an evaluation and management service by video and the availability of in person appointments. I also discussed with the patient that there may be a patient responsible charge related to this service. The patient expressed understanding and agreed to proceed.   History of Present Illness: Pt. presents for review of PET scan after abnormal low-dose screening CT.Marland Kitchen Patient had a low-dose screening CT in June 2024 which was read as a lung RADS 4B.  There was a new nodular area of architectural distortion in the periphery of her left lower lobe at the lung base.  While it could have been infectious or inflammatory neoplasm was not excluded and therefore PET scan was recommended. Today we reviewed patient's PET scan which was done January 30, 2023.  We discussed that there were no suspicious hypermetabolic findings in the chest specifically, no abnormality identified in the left lateral costophrenic angle to correspond with the finding on the prior CT chest.  Recommendation is for 56-month follow-up low-dose screening CT.  This will be due August 2025. She has verbalized understanding of the above and is in agreement with a follow-up scan in 12 months. There was notation of a focal hypermetabolic activity in the distal transverse colon with a possible associated soft tissue nodule.  They state that this could reflect a polyp or colon cancer.  Recommendation is to consider a colonoscopy if this has not been recently performed. Patient states she has not had a colonoscopy since about 2016 as she has had 2 bad experiences with her colon being nicked during the  procedure.  These both required hospitalization I discussed with the patient there is now an option for doing a bowel prep, then swallowing a pill with a camera.  I explained that while this is not as good as a colonoscopy it is better than no colonoscopy.  I have asked her to follow-up with her primary care doctor to get this scheduled. I will send him a copy of the PET scan and let him know patient would like to have follow-up to schedule a pill colonoscopy to better investigate this finding.  I have also reminded the patient that if she does not hear from her primary care doctor in the next week or so to give their office a call to specifically ask to get a pill colonoscopy scheduled.  She is in agreement with this plan.   Observations/Objective: 01/30/2023 PET scan>> PET scan had been scheduled earlier, and cancelled by the patient . It was then rescheduled.  No suspicious hypermetabolic activity in the chest. Specifically, no discrete abnormality identified at the left lateral costophrenic angle to correspond with the finding on prior chest CT. This may have been inflammatory/infectious. Suggest resumption of annual screening with low-dose chest CT without contrast in 12 months unless earlier imaging is warranted clinically. Focal hypermetabolic activity within the distal transverse colon with possible associated soft tissue nodule. This could reflect a polyp or colon cancer. Consider colonoscopy if not recently performed. Stable postsurgical changes from previous cystectomy and urinary diversion. No evidence of metastatic bladder cancer. Aortic Atherosclerosis (ICD10-I70.0).  Low Dose CT Chest 12/20/2022  Assessment and Plan: PET scan negative for hypermetabolism in nodule of concern noted as 4B on screening CT Chest. Plan You PET scan showed the nodule of concern noted on your screening scan was not hypermetabolic. Radiology feels this represents an area of infection or  inflammation. This is good news. We will do a follow up screening CT Chest 01/2024  You will get a call to get this scheduled closer to the time it is due. We will call you with the results.  There was notation of some hypermetabolic activity in your colon on the PET scan. Radiology recommend a colonoscopy, as you have not had one in awhile.  There is now an option to do this with a pill that takes pictures.  Please call Dr. Tanya Nones and ask him to get this scheduled.  I have also sent him a message with the PET scan results attached.  If you do not hear from his office in a week or two, please follow up with him.  Please contact office for sooner follow up if symptoms do not improve or worsen or seek emergency care     Follow Up Instructions: We will do a follow up screening CT Chest 01/2024  You will get a call to get this scheduled closer to the time it is due. We will call you with the results.  Please call Dr. Tanya Nones and ask him to get this scheduled.  I have also sent him a message with the PET scan results attached.  If you do not hear from his office in a week or two, please follow up with him.    I discussed the assessment and treatment plan with the patient. The patient was provided an opportunity to ask questions and all were answered. The patient agreed with the plan and demonstrated an understanding of the instructions.   The patient was advised to call back or seek an in-person evaluation if the symptoms worsen or if the condition fails to improve as anticipated.  I provided 25  minutes of non-face-to-face time during this encounter.   Bevelyn Ngo, NP

## 2023-02-04 NOTE — Patient Instructions (Addendum)
It was good to see you today. You PET scan showed the nodule of concern noted on your screening scan was not hypermetabolic. Radiology feels this represents an area of infection or inflammation. This is good news. We will do a follow up screening CT Chest 01/2024  You will get a call to get this scheduled closer to the time it is due. We will call you with the results.  There was notation of some hypermetabolic activity in your colon on the PET scan. Radiology recommend a colonoscopy, as you have not had one in awhile.  There is now an option to do this with a pill that takes pictures.  Please call Dr. Tanya Nones and ask him to get this scheduled.  I have also sent him a message with the PET scan results attached.  If you do not hear from his office in a week or two, please follow up with him.  Please contact office for sooner follow up if symptoms do not improve or worsen or seek emergency care

## 2023-02-06 ENCOUNTER — Other Ambulatory Visit: Payer: Self-pay | Admitting: *Deleted

## 2023-02-06 DIAGNOSIS — Z87891 Personal history of nicotine dependence: Secondary | ICD-10-CM

## 2023-02-06 DIAGNOSIS — Z122 Encounter for screening for malignant neoplasm of respiratory organs: Secondary | ICD-10-CM

## 2023-02-12 ENCOUNTER — Telehealth: Payer: Self-pay | Admitting: Family Medicine

## 2023-02-12 NOTE — Telephone Encounter (Signed)
Pharmacy sent eFax to follow up on refill request for 90 day script of levothyroxine (SYNTHROID) 125 MCG tablet   Pharmacy notifying provider the manufacturer for the generic of LEVOTHYROXINE is changing from [facility] to [facility].  Requesting call back to make sure it's still ok to refill the script.   Pharmacy:   [facility] - [place], [place] - [address] [facility] [address] [place] [address], [place] [address] Phone: [phone]  Fax: [phone]   Please advise pharmacist.

## 2023-02-18 ENCOUNTER — Other Ambulatory Visit: Payer: Self-pay | Admitting: Family Medicine

## 2023-02-18 ENCOUNTER — Telehealth: Payer: Self-pay | Admitting: Family Medicine

## 2023-02-18 MED ORDER — OXYCODONE HCL 5 MG PO TABS: 5.0000 mg | ORAL_TABLET | Freq: Four times a day (QID) | ORAL | 0 refills | Status: DC | PRN

## 2023-02-18 NOTE — Telephone Encounter (Signed)
Prescription Request  02/18/2023  LOV: 01/21/2023  What is the name of the medication or equipment?   oxyCODONE (OXY IR/ROXICODONE) 5 MG immediate release tablet   Have you contacted your pharmacy to request a refill? Yes   Which pharmacy would you like this sent to?  [name] - [place], [place] - [address] [place] [address] Phone: [phone] Fax: [phone]    Patient notified that their request is being sent to the clinical staff for review and that they should receive a response within 2 business days.   Please advise patient.

## 2023-02-20 ENCOUNTER — Other Ambulatory Visit: Payer: Self-pay | Admitting: Family Medicine

## 2023-02-20 ENCOUNTER — Telehealth: Payer: Self-pay | Admitting: Family Medicine

## 2023-02-20 MED ORDER — MOLNUPIRAVIR EUA 200MG CAPSULE: 4.0000 | ORAL_CAPSULE | Freq: Two times a day (BID) | ORAL | 0 refills | Status: AC

## 2023-02-20 NOTE — Telephone Encounter (Signed)
Patient called to report positive COVID test result received yesterday.   Sx: cough, congestion, runny nose, fatigue. Sx began 2-3 days ago.  Patient requesting for provider to call in PAXLOVID.  Pharmacy confirmed as:  [facility] - [place], [place] - [address] [place], [place] [address] Phone: [phone]  Fax: [phone]   Please advise at [phone].

## 2023-03-07 DIAGNOSIS — Z936 Other artificial openings of urinary tract status: Secondary | ICD-10-CM | POA: Diagnosis not present

## 2023-03-17 ENCOUNTER — Other Ambulatory Visit: Payer: Self-pay | Admitting: Family Medicine

## 2023-03-17 ENCOUNTER — Ambulatory Visit (HOSPITAL_COMMUNITY)
Admission: RE | Admit: 2023-03-17 | Discharge: 2023-03-17 | Disposition: A | Discharge status: Home / Self Care | Payer: 59 | Source: Ambulatory Visit | Attending: Urology | Admitting: Urology

## 2023-03-17 DIAGNOSIS — K56609 Unspecified intestinal obstruction, unspecified as to partial versus complete obstruction: Secondary | ICD-10-CM

## 2023-03-17 DIAGNOSIS — Z8551 Personal history of malignant neoplasm of bladder: Secondary | ICD-10-CM | POA: Diagnosis not present

## 2023-03-17 DIAGNOSIS — G8929 Other chronic pain: Secondary | ICD-10-CM

## 2023-03-17 DIAGNOSIS — C679 Malignant neoplasm of bladder, unspecified: Secondary | ICD-10-CM | POA: Insufficient documentation

## 2023-03-18 ENCOUNTER — Ambulatory Visit: Payer: 59 | Admitting: Family Medicine

## 2023-03-18 ENCOUNTER — Encounter: Payer: Self-pay | Admitting: Family Medicine

## 2023-03-18 VITALS — BP 120/90 | HR 83 | Temp 98.3°F | Ht 61.0 in | Wt 190.0 lb

## 2023-03-18 DIAGNOSIS — R319 Hematuria, unspecified: Secondary | ICD-10-CM | POA: Diagnosis not present

## 2023-03-18 DIAGNOSIS — N390 Urinary tract infection, site not specified: Secondary | ICD-10-CM | POA: Diagnosis not present

## 2023-03-18 DIAGNOSIS — R300 Dysuria: Secondary | ICD-10-CM

## 2023-03-18 LAB — URINALYSIS, ROUTINE W REFLEX MICROSCOPIC
Bilirubin Urine: NEGATIVE
Glucose, UA: NEGATIVE
Hyaline Cast: NONE SEEN /LPF
Ketones, ur: NEGATIVE
Nitrite: POSITIVE — AB
Specific Gravity, Urine: 1.015 (ref 1.001–1.035)
pH: 6 (ref 5.0–8.0)

## 2023-03-18 LAB — MICROSCOPIC MESSAGE

## 2023-03-18 MED ORDER — CIPROFLOXACIN HCL 500 MG PO TABS
500.0000 mg | ORAL_TABLET | Freq: Two times a day (BID) | ORAL | 0 refills | Status: AC
Start: 2023-03-18 — End: 2023-03-25

## 2023-03-18 NOTE — Assessment & Plan Note (Signed)
Urine dipstick shows positive for RBC's, positive for protein, positive for nitrates, and positive for leukocytes.  Micro exam: 10-20 WBC's per HPF, 3-10 RBC's per HPF, and moderate+ bacteria. Will treat with Cipro 500mg  BID for 7 days. Instructed to push fluids and rest, may use Tylenol PRN. Return to office if symptoms persist or worsen.

## 2023-03-18 NOTE — Progress Notes (Signed)
Subjective:  HPI: [name] is a 72 y.o. female presenting on 03/18/2023 for Urinary Tract Infection   Urinary Tract Infection    Patient is in today for fatigue and some mild abdominal pain since last week. She does have a urostomy and reports urine darker than normal. These symptoms are similar to symptoms of past UTIs. Denies fever, chills, body aches, flank pain, abdominal pain, hematuria Has tried nothing.  Review of Systems  All other systems reviewed and are negative.   Relevant past medical history reviewed and updated as indicated.   Past Medical History:  Diagnosis Date   Acid reflux    Bowel obstruction (HCC) several   Recurrent SBO secondary to adhesions   Cancer [facility]) 2011 [facility])   diagnosed in 2009 per pt. Invasive High grade Urothelial carcinoma- s/p radiation and Chemo    Chronic back pain    DVT (deep venous thrombosis) (HCC) 2015   Fatty liver    Gout    Hyperlipidemia    Hypertension    pt says taken off medication since has lost weight.   Hypothyroidism    Internal hemorrhoids    Leukopenia 12/06/2011   HIV serology negative   Malnutrition (HCC)    PE (pulmonary embolism)    [facility]   Rectal ulcer 2016   [facility]   UTI (lower urinary tract infection)    Recurrent     Past Surgical History:  Procedure Laterality Date   ABDOMINAL SURGERY     with intestinal "puncture" x 2, exploratory surgery    BACK SURGERY     X2   BLADDER REMOVAL     CHOLECYSTECTOMY     CYSTECTOMY     breast   HERNIA REPAIR     mesh   ILEO CONDUIT     For bladder cancer   PORTACATH PLACEMENT      Allergies and medications reviewed and updated.   Current Outpatient Medications:    apixaban (ELIQUIS) 5 MG TABS tablet, TAKE 1 TABLET BY MOUTH TWICE  DAILY, Disp: 200 tablet, Rfl: 1   Ascorbic Acid (VITAMIN C PO), Take 1 tablet by mouth daily., Disp: , Rfl:    Casanthranol-Docusate Sodium (STOOL SOFTENER PLUS PO), Take by mouth.,  Disp: , Rfl:    ciprofloxacin (CIPRO) 500 MG tablet, Take 1 tablet (500 mg total) by mouth 2 (two) times daily for 7 days., Disp: 14 tablet, Rfl: 0   colestipol (COLESTID) 1 g tablet, Take by mouth., Disp: , Rfl:    Cyanocobalamin 1000 MCG CAPS, Take 1 tablet by mouth daily., Disp: 90 capsule, Rfl: 3   dexlansoprazole (DEXILANT) 60 MG capsule, TAKE 1 CAPSULE BY MOUTH DAILY, Disp: 100 capsule, Rfl: 2   diazepam (VALIUM) 5 MG tablet, Take 1 tablet (5 mg total) by mouth every 12 (twelve) hours as needed for anxiety (take 30 minutes before PET scan)., Disp: 2 tablet, Rfl: 1   diclofenac Sodium (VOLTAREN) 1 % GEL, APPLY TO AFFECTED AREAS 3 TIMES DAILY AS NEEDED., Disp: 100 g, Rfl: 0   Ergocalciferol (VITAMIN D2) 50 MCG (2000 UT) TABS, tAKE 1 Tablet daily, Disp: 30 tablet, Rfl:    fluticasone (FLONASE) 50 MCG/ACT nasal spray, Place 2 sprays into both nostrils daily., Disp: 16 g, Rfl: 6   furosemide (LASIX) 40 MG tablet, TAKE 1 TABLET BY MOUTH TWICE  DAILY AS NEEDED FOR EDEMA, Disp: 200 tablet, Rfl: 2   levothyroxine (SYNTHROID) 125 MCG tablet, Take 1 tablet (125 mcg  total) by mouth daily., Disp: 90 tablet, Rfl: 3   lidocaine (XYLOCAINE) 5 % ointment, APPLY TOPICALLY 4 TIMES  DAILY AS NEEDED, Disp: 354.4 g, Rfl: 0   linaclotide (LINZESS) 145 MCG CAPS capsule, Take 1 capsule (145 mcg total) by mouth daily before breakfast., Disp: 30 capsule, Rfl: 3   magnesium oxide (MAG-OX) 400 MG tablet, Take by mouth., Disp: , Rfl:    Magnesium Oxide 400 (240 Mg) MG TABS, Take 1.5 tablet BID, Disp: 90 tablet, Rfl: 2   Multiple Vitamin (MULTI-VITAMIN) tablet, Take 2 tablets by mouth daily., Disp: , Rfl:    Multiple Vitamins-Minerals (MULTIVITAMIN ADULT, MINERALS,) TABS, Take 1 tablet by mouth daily., Disp: , Rfl:    ondansetron (ZOFRAN-ODT) 8 MG disintegrating tablet, DISSOLVE 1 TABLET ON THE  TONGUE EVERY 8 HOURS AS  NEEDED FOR NAUSEA, Disp: 90 tablet, Rfl: 3   oxyCODONE (OXY IR/ROXICODONE) 5 MG immediate release  tablet, Take 1 tablet (5 mg total) by mouth every 6 (six) hours as needed for severe pain., Disp: 120 tablet, Rfl: 0   potassium chloride SA (KLOR-CON M) 20 MEQ tablet, TAKE 1 TABLET BY MOUTH DAILY, Disp: 100 tablet, Rfl: 1   pravastatin (PRAVACHOL) 20 MG tablet, Take 1 tablet (20 mg total) by mouth daily., Disp: 100 tablet, Rfl: 1   pregabalin (LYRICA) 75 MG capsule, Take 1 capsule (75 mg total) by mouth 2 (two) times daily., Disp: 60 capsule, Rfl: 0   traZODone (DESYREL) 50 MG tablet, Take 1 tablet (50 mg total) by mouth at bedtime., Disp: 30 tablet, Rfl: 1   benzonatate (TESSALON) 200 MG capsule, Take 1 capsule (200 mg total) by mouth 2 (two) times daily as needed for cough. (Patient not taking: Reported on 08/09/2022), Disp: 20 capsule, Rfl: 0   doxycycline (VIBRA-TABS) 100 MG tablet, Take 1 tablet (100 mg total) by mouth 2 (two) times daily. (Patient not taking: Reported on 03/18/2023), Disp: 14 tablet, Rfl: 0  Allergies  Allergen Reactions   Bactrim [Sulfamethoxazole-Trimethoprim]    Codeine Hives   Lactose Diarrhea   Nitrofuran Derivatives Itching   Sulfa Antibiotics Hives   Penicillins Rash    Has patient had a PCN reaction causing immediate rash, facial/tongue/throat swelling, SOB or lightheadedness with hypotension: No Has patient had a PCN reaction causing severe rash involving mucus membranes or skin necrosis: No Has patient had a PCN reaction that required hospitalization: No Has patient had a PCN reaction occurring within the last 10 years: No If all of the above answers are "NO", then may proceed with Cephalosporin use.  Childhood rash - no adult reactions known    Objective:   BP (!) 120/90   Pulse 83   Temp 98.3 F (36.8 C) (Oral)   Ht 5\' 1"  (1.549 m)   Wt 190 lb (86.2 kg)   SpO2 96%   BMI 35.90 kg/m      03/18/2023    2:39 PM 01/21/2023   11:45 AM 07/23/2022    2:13 PM  Vitals with BMI  Height 5\' 1"  5\' 5"  5\' 5"   Weight 190 lbs 184 lbs 183 lbs  BMI 35.92 30.62  30.45  Systolic 120 124 478  Diastolic 90 70 78  Pulse 83 91 97     Physical Exam Vitals and nursing note reviewed.  Constitutional:      Appearance: Normal appearance. She is normal weight.  HENT:     Head: Normocephalic and atraumatic.  Abdominal:     General: The ostomy site is clean.  There is no distension.     Palpations: Abdomen is soft.     Tenderness: There is right CVA tenderness and left CVA tenderness.       Comments: Stoma pink and moist, urine clear yellow  Skin:    General: Skin is warm and dry.  Neurological:     General: No focal deficit present.     Mental Status: She is alert and oriented to person, place, and time. Mental status is at baseline.  Psychiatric:        Mood and Affect: Mood normal.        Behavior: Behavior normal.        Thought Content: Thought content normal.        Judgment: Judgment normal.     Assessment & Plan:  Urinary tract infection with hematuria, site unspecified Assessment & Plan: Urine dipstick shows positive for RBC's, positive for protein, positive for nitrates, and positive for leukocytes.  Micro exam: 10-20 WBC's per HPF, 3-10 RBC's per HPF, and moderate+ bacteria. Will treat with Cipro 500mg  BID for 7 days. Instructed to push fluids and rest, may use Tylenol PRN. Return to office if symptoms persist or worsen.    Dysuria -     Urinalysis, Routine w reflex microscopic -     Urine Culture  Other orders -     Ciprofloxacin HCl; Take 1 tablet (500 mg total) by mouth 2 (two) times daily for 7 days.  Dispense: 14 tablet; Refill: 0     Follow up plan: Return if symptoms worsen or fail to improve.  Park Meo, FNP

## 2023-03-19 NOTE — Telephone Encounter (Signed)
Requested medication (s) are due for refill today: yes  Requested medication (s) are on the active medication list: yes  Last refill:  Trazodone: 06/17/22 #30 1 RF       Linzess: 10/21/22 #30 3 RF  Future visit scheduled: no  Notes to clinic:  overdue for appointment- sent pt MyChart message to call office and make appt   Requested Prescriptions  Pending Prescriptions Disp Refills   traZODone (DESYREL) 50 MG tablet [Pharmacy Med Name: TRAZODONE 50 MG TABLET] 30 tablet 0    Sig: TAKE 1 TABLET BY MOUTH AT BEDTIME.     Psychiatry: Antidepressants - Serotonin Modulator Failed - 03/17/2023  4:54 PM      Failed - Valid encounter within last 6 months    Recent Outpatient Visits           1 year ago Pelvic floor dysfunction in female   [facility] Medicine Pickard, Priscille Heidelberg, MD   1 year ago No-show for appointment   [facility] Valentino Nose, NP   1 year ago Right arm pain   [facility] Medicine Tanya Nones, Priscille Heidelberg, MD   2 years ago Lateral epicondylitis of right elbow   [facility] Medicine Tanya Nones, Priscille Heidelberg, MD   2 years ago Hypothyroidism, unspecified type   [facility] Medicine Pickard, Priscille Heidelberg, MD       Future Appointments             In 1 month McKenzie, Mardene Celeste, MD [facility] [place]             LINZESS 145 MCG CAPS capsule [Pharmacy Med Name: Karlene Einstein 145 MCG CAPSULE]  0    Sig: take 1 capsule (145 MICROGRAM total) by mouth daily before breakfast.     Gastroenterology: Irritable Bowel Syndrome Failed - 03/17/2023  4:54 PM      Failed - Valid encounter within last 12 months    Recent Outpatient Visits           1 year ago Pelvic floor dysfunction in female   [facility] Medicine Pickard, Priscille Heidelberg, MD   1 year ago No-show for appointment   [facility] Medicine Valentino Nose, NP   1 year ago Right arm pain   [facility] Tanya Nones Priscille Heidelberg, MD   2 years ago  Lateral epicondylitis of right elbow   [facility] Medicine Donita Brooks, MD   2 years ago Hypothyroidism, unspecified type   [facility] Medicine Pickard, Priscille Heidelberg, MD       Future Appointments             In 1 month McKenzie, Mardene Celeste, MD [facility] [place]

## 2023-03-20 ENCOUNTER — Other Ambulatory Visit: Payer: Self-pay | Admitting: Family Medicine

## 2023-03-20 DIAGNOSIS — K56609 Unspecified intestinal obstruction, unspecified as to partial versus complete obstruction: Secondary | ICD-10-CM

## 2023-03-20 DIAGNOSIS — G8929 Other chronic pain: Secondary | ICD-10-CM

## 2023-03-24 NOTE — Telephone Encounter (Signed)
Requested medication (s) are due for refill today:   Provider to review  Requested medication (s) are on the active medication list:   Yes  Future visit scheduled:   No.   LOV 03/18/2023 with Amber   Last ordered: Linzess 10/21/2022 #30, 3 refills;   Oxycodone 02/18/2023 #120, 0 refills  Returned due to being non delegated refill   Requested Prescriptions  Pending Prescriptions Disp Refills   LINZESS 145 MCG CAPS capsule [Pharmacy Med Name: LINZESS 145 MCG CAPSULE]  0    Sig: TAKE 1 CAPSULE DAILY BEFORE BREAKFAST.     Gastroenterology: Irritable Bowel Syndrome Failed - 03/20/2023  5:30 PM      Failed - Valid encounter within last 12 months    Recent Outpatient Visits           1 year ago Pelvic floor dysfunction in female   [facility] Pickard, Priscille Heidelberg, MD   1 year ago No-show for appointment   [facility] Medicine Valentino Nose, NP   1 year ago Right arm pain   [facility] Medicine Tanya Nones, Priscille Heidelberg, MD   2 years ago Lateral epicondylitis of right elbow   [facility] Family Medicine Donita Brooks, MD   2 years ago Hypothyroidism, unspecified type   [facility] Medicine Pickard, Priscille Heidelberg, MD       Future Appointments             In 1 month McKenzie, Mardene Celeste, MD [facility] [place]             oxyCODONE (OXY IR/ROXICODONE) 5 MG immediate release tablet [Pharmacy Med Name: OXYCODONE HCL (IR) 5 MG TABLET] 120 tablet 0    Sig: take 1 tablet (5 MILLIGRAM total) by mouth every 6 (six) hours as needed for severe pain.     Not Delegated - Analgesics:  Opioid Agonists Failed - 03/20/2023  5:30 PM      Failed - This refill cannot be delegated      Failed - Urine Drug Screen completed in last 360 days      Failed - Valid encounter within last 3 months    Recent Outpatient Visits           1 year ago Pelvic floor dysfunction in female   [facility] Medicine Pickard, Priscille Heidelberg, MD   1 year ago No-show  for appointment   [facility] Medicine Valentino Nose, NP   1 year ago Right arm pain   [facility] Tanya Nones Priscille Heidelberg, MD   2 years ago Lateral epicondylitis of right elbow   [facility] Medicine Donita Brooks, MD   2 years ago Hypothyroidism, unspecified type   [facility] Medicine Pickard, Priscille Heidelberg, MD       Future Appointments             In 1 month McKenzie, Mardene Celeste, MD [facility] [place]

## 2023-03-25 NOTE — Telephone Encounter (Signed)
Patient called to follow up on refills requested for:  linaclotide [facility]) 145 MCG CAPS capsule [[id number]]   oxyCODONE (OXY IR/ROXICODONE) 5 MG immediate release tablet [[id number]]   traZODone (DESYREL) 50 MG tablet [[id number]]   ** LOV: 03/18/23, and 01/21/23  Pharmacy confirmed as:  [facility] [place], [place] - [address] [place], [place] [address] Phone: [phone]  Fax: [phone]   Please advise at [phone].

## 2023-03-26 NOTE — Telephone Encounter (Signed)
Pt requests refills on Trazodone and Oxycodone. Last RF on Oxy was 02/18/23. Last RF on Trazodone was 06/18/2023. Last OV 01/21/2023. I sent in the Linzess. Thank you.

## 2023-03-28 ENCOUNTER — Ambulatory Visit: Payer: Medicare Other | Admitting: Urology

## 2023-04-15 DIAGNOSIS — Z936 Other artificial openings of urinary tract status: Secondary | ICD-10-CM | POA: Diagnosis not present

## 2023-04-21 ENCOUNTER — Telehealth: Payer: Self-pay

## 2023-04-21 ENCOUNTER — Other Ambulatory Visit: Payer: Self-pay | Admitting: Family Medicine

## 2023-04-21 MED ORDER — OXYCODONE HCL 5 MG PO TABS: 5.0000 mg | ORAL_TABLET | Freq: Four times a day (QID) | ORAL | 0 refills | Status: DC | PRN

## 2023-04-21 NOTE — Telephone Encounter (Signed)
Pt called in to request a refill of this med oxyCODONE (OXY IR/ROXICODONE) 5 MG immediate release tablet [[id number]]. Pt asks if pcp could please send this refill out by Thursday 04/24/23 as pt is going out of town. Please advise.  LOV: 03/18/23  PHARMACY: [facility] - [place], [place] - [address] [place], [place] [address] Phone: [phone]  Fax: [phone] DEA #: -- DAW Reason: --

## 2023-04-30 ENCOUNTER — Ambulatory Visit: Payer: 59 | Admitting: Urology

## 2023-05-16 ENCOUNTER — Other Ambulatory Visit: Payer: Self-pay | Admitting: Family Medicine

## 2023-05-19 NOTE — Telephone Encounter (Signed)
Requested Prescriptions  Pending Prescriptions Disp Refills   furosemide (LASIX) 40 MG tablet [Pharmacy Med Name: Furosemide 40 MG Oral Tablet] 200 tablet     Sig: TAKE 1 TABLET BY MOUTH TWICE  DAILY AS NEEDED FOR EDEMA     Cardiovascular:  Diuretics - Loop Failed - 05/16/2023 10:35 PM      Failed - K in normal range and within 180 days    Potassium  Date Value Ref Range Status  06/06/2022 3.8 3.5 - 5.3 mmol/L Final         Failed - Ca in normal range and within 180 days    Calcium  Date Value Ref Range Status  06/06/2022 9.6 8.6 - 10.4 mg/dL Final   Calcium, Ion  Date Value Ref Range Status  11/22/2015 1.05 (L) 1.13 - 1.30 mmol/L Final         Failed - Na in normal range and within 180 days    Sodium  Date Value Ref Range Status  06/06/2022 139 135 - 146 mmol/L Final  03/30/2020 141 134 - 144 mmol/L Final         Failed - Cr in normal range and within 180 days    Creat  Date Value Ref Range Status  06/06/2022 1.26 (H) 0.60 - 1.00 mg/dL Final         Failed - Cl in normal range and within 180 days    Chloride  Date Value Ref Range Status  06/06/2022 103 98 - 110 mmol/L Final         Failed - Mg Level in normal range and within 180 days    Magnesium  Date Value Ref Range Status  01/31/2020 0.9 (LL) 1.5 - 2.5 mg/dL Final    Comment:    Verified by repeat analysis. .          Failed - Last BP in normal range    BP Readings from Last 1 Encounters:  03/18/23 (!) 120/90         Failed - Valid encounter within last 6 months    Recent Outpatient Visits           1 year ago Pelvic floor dysfunction in female   [facility] Medicine Pickard, Priscille Heidelberg, MD   1 year ago No-show for appointment   [facility] Valentino Nose, NP   2 years ago Right arm pain   [facility] Tanya Nones, Priscille Heidelberg, MD   2 years ago Lateral epicondylitis of right elbow   [facility] Medicine Tanya Nones, Priscille Heidelberg, MD   2 years ago  Hypothyroidism, unspecified type   [facility] Medicine Pickard, Priscille Heidelberg, MD       Future Appointments             In 2 weeks McKenzie, Mardene Celeste, MD [facility] [place]             potassium chloride SA (KLOR-CON M) 20 MEQ tablet [Pharmacy Med Name: Potassium Chloride Crys ER 20 MEQ Oral Tablet Extended Release] 100 tablet     Sig: TAKE 1 TABLET BY MOUTH DAILY     Endocrinology:  Minerals - Potassium Supplementation Failed - 05/16/2023 10:35 PM      Failed - Cr in normal range and within 360 days    Creat  Date Value Ref Range Status  06/06/2022 1.26 (H) 0.60 - 1.00 mg/dL Final         Failed -  Valid encounter within last 12 months    Recent Outpatient Visits           1 year ago Pelvic floor dysfunction in female   [facility] Medicine Pickard, Priscille Heidelberg, MD   1 year ago No-show for appointment   [facility] Medicine Valentino Nose, NP   2 years ago Right arm pain   [facility] Medicine Tanya Nones, Priscille Heidelberg, MD   2 years ago Lateral epicondylitis of right elbow   [facility] Medicine Tanya Nones, Priscille Heidelberg, MD   2 years ago Hypothyroidism, unspecified type   [facility] Medicine Pickard, Priscille Heidelberg, MD       Future Appointments             In 2 weeks McKenzie, Mardene Celeste, MD [facility] [place]            Passed - K in normal range and within 360 days    Potassium  Date Value Ref Range Status  06/06/2022 3.8 3.5 - 5.3 mmol/L Final          dexlansoprazole (DEXILANT) 60 MG capsule [Pharmacy Med Name: DEXLANSOPRAZOLE  60MG   CAP  DELAYED RELEASE] 100 capsule 0    Sig: TAKE 1 CAPSULE BY MOUTH DAILY     Gastroenterology: Proton Pump Inhibitors Failed - 05/16/2023 10:35 PM      Failed - Valid encounter within last 12 months    Recent Outpatient Visits           1 year ago Pelvic floor dysfunction in female   [facility] Medicine Pickard, Priscille Heidelberg, MD   1 year ago No-show for  appointment   [facility] Family Medicine Valentino Nose, NP   2 years ago Right arm pain   [facility] Medicine Tanya Nones, Priscille Heidelberg, MD   2 years ago Lateral epicondylitis of right elbow   [facility] Medicine Tanya Nones, Priscille Heidelberg, MD   2 years ago Hypothyroidism, unspecified type   [facility] Medicine Pickard, Priscille Heidelberg, MD       Future Appointments             In 2 weeks McKenzie, Mardene Celeste, MD [facility]rology [place]             apixaban (ELIQUIS) 5 MG TABS tablet [Pharmacy Med Name: Eliquis 5 MG Oral Tablet] 200 tablet     Sig: TAKE 1 TABLET BY MOUTH TWICE  DAILY     Hematology:  Anticoagulants - apixaban Failed - 05/16/2023 10:35 PM      Failed - Cr in normal range and within 360 days    Creat  Date Value Ref Range Status  06/06/2022 1.26 (H) 0.60 - 1.00 mg/dL Final         Failed - Valid encounter within last 12 months    Recent Outpatient Visits           1 year ago Pelvic floor dysfunction in female   [facility] Medicine Pickard, Priscille Heidelberg, MD   1 year ago No-show for appointment   [facility] Medicine Valentino Nose, NP   2 years ago Right arm pain   [facility] Medicine Tanya Nones, Priscille Heidelberg, MD   2 years ago Lateral epicondylitis of right elbow   [facility] Family Medicine Tanya Nones Priscille Heidelberg, MD   2 years ago Hypothyroidism, unspecified type   [facility] Medicine Donita Brooks,  MD       Future Appointments             In 2 weeks McKenzie, Mardene Celeste, MD [facility]rology [place]            Passed - PLT in normal range and within 360 days    Platelets  Date Value Ref Range Status  06/06/2022 374 140 - 400 Thousand/uL Final         Passed - HGB in normal range and within 360 days    Hemoglobin  Date Value Ref Range Status  06/06/2022 13.4 11.7 - 15.5 g/dL Final         Passed - HCT in normal range and within 360 days    HCT  Date Value Ref Range Status   06/06/2022 40.4 35.0 - 45.0 % Final         Passed - AST in normal range and within 360 days    AST  Date Value Ref Range Status  06/06/2022 10 10 - 35 U/L Final         Passed - ALT in normal range and within 360 days    ALT  Date Value Ref Range Status  06/06/2022 8 6 - 29 U/L Final

## 2023-05-19 NOTE — Telephone Encounter (Signed)
Requested medication (s) are due for refill today: yes to all 3 meds  Requested medication (s) are on the active medication list: yes}  Last refill:  Furosemide: 08/29/22 #200 2 RF                     Potassium: 12/02/22 #100 1 RF                      apixaban: 12/02/22 #200 1 RF  Future visit scheduled: no  Notes to clinic:  Labwork (Cr) out of range and Lab work overdue   Requested Prescriptions  Pending Prescriptions Disp Refills   furosemide (LASIX) 40 MG tablet [Pharmacy Med Name: Furosemide 40 MG Oral Tablet] 200 tablet     Sig: TAKE 1 TABLET BY MOUTH TWICE  DAILY AS NEEDED FOR EDEMA     Cardiovascular:  Diuretics - Loop Failed - 05/16/2023 10:35 PM      Failed - K in normal range and within 180 days    Potassium  Date Value Ref Range Status  06/06/2022 3.8 3.5 - 5.3 mmol/L Final         Failed - Ca in normal range and within 180 days    Calcium  Date Value Ref Range Status  06/06/2022 9.6 8.6 - 10.4 mg/dL Final   Calcium, Ion  Date Value Ref Range Status  11/22/2015 1.05 (L) 1.13 - 1.30 mmol/L Final         Failed - Na in normal range and within 180 days    Sodium  Date Value Ref Range Status  06/06/2022 139 135 - 146 mmol/L Final  03/30/2020 141 134 - 144 mmol/L Final         Failed - Cr in normal range and within 180 days    Creat  Date Value Ref Range Status  06/06/2022 1.26 (H) 0.60 - 1.00 mg/dL Final         Failed - Cl in normal range and within 180 days    Chloride  Date Value Ref Range Status  06/06/2022 103 98 - 110 mmol/L Final         Failed - Mg Level in normal range and within 180 days    Magnesium  Date Value Ref Range Status  01/31/2020 0.9 (LL) 1.5 - 2.5 mg/dL Final    Comment:    Verified by repeat analysis. .          Failed - Last BP in normal range    BP Readings from Last 1 Encounters:  03/18/23 (!) 120/90         Failed - Valid encounter within last 6 months    Recent Outpatient Visits           1 year ago Pelvic floor  dysfunction in female   [facility] Tanya Nones, Priscille Heidelberg, MD   1 year ago No-show for appointment   [facility] Valentino Nose, NP   2 years ago Right arm pain   [facility] Tanya Nones Priscille Heidelberg, MD   2 years ago Lateral epicondylitis of right elbow   [facility] Donita Brooks, MD   2 years ago Hypothyroidism, unspecified type   [facility] Pickard, Priscille Heidelberg, MD       Future Appointments             In 2 weeks McKenzie, Mardene Celeste, MD [facility] [place]  potassium chloride SA (KLOR-CON M) 20 MEQ tablet [Pharmacy Med Name: Potassium Chloride Crys ER 20 MEQ Oral Tablet Extended Release] 100 tablet     Sig: TAKE 1 TABLET BY MOUTH DAILY     Endocrinology:  Minerals - Potassium Supplementation Failed - 05/16/2023 10:35 PM      Failed - Cr in normal range and within 360 days    Creat  Date Value Ref Range Status  06/06/2022 1.26 (H) 0.60 - 1.00 mg/dL Final         Failed - Valid encounter within last 12 months    Recent Outpatient Visits           1 year ago Pelvic floor dysfunction in female   [facility] [place]ard, Priscille Heidelberg, MD   1 year ago No-show for appointment   [facility] Valentino Nose, NP   2 years ago Right arm pain   [facility] Tanya Nones, Priscille Heidelberg, MD   2 years ago Lateral epicondylitis of right elbow   [facility] Tanya Nones, Priscille Heidelberg, MD   2 years ago Hypothyroidism, unspecified type   [facility] [place], Priscille Heidelberg, MD       Future Appointments             In 2 weeks McKenzie, Mardene Celeste, MD [facility] [place]            Passed - K in normal range and within 360 days    Potassium  Date Value Ref Range Status  06/06/2022 3.8 3.5 - 5.3 mmol/L Final          apixaban (ELIQUIS) 5 MG TABS tablet [Pharmacy Med Name: Eliquis 5 MG Oral Tablet] 200  tablet     Sig: TAKE 1 TABLET BY MOUTH TWICE  DAILY     Hematology:  Anticoagulants - apixaban Failed - 05/16/2023 10:35 PM      Failed - Cr in normal range and within 360 days    Creat  Date Value Ref Range Status  06/06/2022 1.26 (H) 0.60 - 1.00 mg/dL Final         Failed - Valid encounter within last 12 months    Recent Outpatient Visits           1 year ago Pelvic floor dysfunction in female   [facility] [place], Priscille Heidelberg, MD   1 year ago No-show for appointment   [facility] Valentino Nose, NP   2 years ago Right arm pain   [facility] Tanya Nones, Priscille Heidelberg, MD   2 years ago Lateral epicondylitis of right elbow   [facility] Tanya Nones, Priscille Heidelberg, MD   2 years ago Hypothyroidism, unspecified type   [facility] [place], Priscille Heidelberg, MD       Future Appointments             In 2 weeks McKenzie, Mardene Celeste, MD [facility] [place]            Passed - PLT in normal range and within 360 days    Platelets  Date Value Ref Range Status  06/06/2022 374 140 - 400 Thousand/uL Final         Passed - HGB in normal range and within 360 days    Hemoglobin  Date Value Ref Range Status  06/06/2022 13.4 11.7 - 15.5 g/dL Final  Passed - HCT in normal range and within 360 days    HCT  Date Value Ref Range Status  06/06/2022 40.4 35.0 - 45.0 % Final         Passed - AST in normal range and within 360 days    AST  Date Value Ref Range Status  06/06/2022 10 10 - 35 U/L Final         Passed - ALT in normal range and within 360 days    ALT  Date Value Ref Range Status  06/06/2022 8 6 - 29 U/L Final         Signed Prescriptions Disp Refills   dexlansoprazole (DEXILANT) 60 MG capsule 100 capsule 0    Sig: TAKE 1 CAPSULE BY MOUTH DAILY     Gastroenterology: Proton Pump Inhibitors Failed - 05/16/2023 10:35 PM      Failed - Valid encounter within last 12 months    Recent  Outpatient Visits           1 year ago Pelvic floor dysfunction in female   [facility] [place], Priscille Heidelberg, MD   1 year ago No-show for appointment   [facility] Valentino Nose, NP   2 years ago Right arm pain   [facility] Tanya Nones, Priscille Heidelberg, MD   2 years ago Lateral epicondylitis of right elbow   [facility] Donita Brooks, MD   2 years ago Hypothyroidism, unspecified type   [facility] Pick[place], Priscille Heidelberg, MD       Future Appointments             In 2 weeks McKenzie, Mardene Celeste, MD [facility] [place]

## 2023-05-26 ENCOUNTER — Other Ambulatory Visit: Payer: Self-pay

## 2023-05-26 NOTE — Telephone Encounter (Signed)
Requested medication (s) are due for refill today: yes  Requested medication (s) are on the active medication list: yes  Last refill:  04/21/23 #120   Future visit scheduled: yes  Notes to clinic:  med not delegated to NT to RF   Requested Prescriptions  Pending Prescriptions Disp Refills   oxyCODONE (OXY IR/ROXICODONE) 5 MG immediate release tablet 120 tablet 0    Sig: Take 1 tablet (5 mg total) by mouth every 6 (six) hours as needed for severe pain (pain score 7-10).     Not Delegated - Analgesics:  Opioid Agonists Failed - 05/26/2023 12:30 PM      Failed - This refill cannot be delegated      Failed - Urine Drug Screen completed in last 360 days      Failed - Valid encounter within last 3 months    Recent Outpatient Visits           1 year ago Pelvic floor dysfunction in female   [facility] Pick[place], Priscille Heidelberg, MD   1 year ago No-show for appointment   [facility] Valentino Nose, NP   2 years ago Right arm pain   [facility] Tanya Nones, Priscille Heidelberg, MD   2 years ago Lateral epicondylitis of right elbow   [facility] Donita Brooks, MD   2 years ago Hypothyroidism, unspecified type   [facility] [place], Priscille Heidelberg, MD       Future Appointments             In 1 week McKenzie, Mardene Celeste, MD [facility] [place]

## 2023-05-26 NOTE — Telephone Encounter (Signed)
Copied from CRM [id number]. Topic: Clinical - Medication Refill >> May 23, 2023 11:32 AM Adelina Mings wrote: Most Recent Primary Care Visit:  Provider: Kurtis Bushman S  Department: [facility]  Visit Type: OFFICE VISIT  Date: 03/18/2023  Medication: oxyCODONE (OXY IR/ROXICODONE) 5 MG immediate release tablet  Has the patient contacted their pharmacy? No (Agent: If no, request that the patient contact the pharmacy for the refill. If patient does not wish to contact the pharmacy document the reason why and proceed with request.) (Agent: If yes, when and what did the pharmacy advise?)  Is this the correct pharmacy for this prescription? Yes If no, delete pharmacy and type the correct one.  This is the patient's preferred pharmacy:  [facility] - [place], [place] - [address] [place] [address] Phone: [phone] Fax: [phone]  [facility] - [place], [place] - [address] [place]  [address] Phone: [phone] Fax: [phone]   Has the prescription been filled recently? No  Is the patient out of the medication? No  Has the patient been seen for an appointment in the last year OR does the patient have an upcoming appointment? Yes  Can we respond through MyChart? No  Agent: Please be advised that Rx refills may take up to 3 business days. We ask that you follow-up with your pharmacy.

## 2023-05-27 ENCOUNTER — Telehealth: Payer: Self-pay

## 2023-05-27 NOTE — Telephone Encounter (Signed)
Copied from CRM [id number]. Topic: Clinical - Medication Refill >> May 27, 2023  3:04 PM Herbert Seta B wrote: Most Recent Primary Care Visit:  Provider: Kurtis Bushman S  Department: [facility]  Visit Type: OFFICE VISIT  Date: 03/18/2023  Medication: oxyCODONE (OXY IR/ROXICODONE) 5 MG immediate release tablet PATIENT 2ND ATTEMPT Has the patient contacted their pharmacy? No(c2) (Agent: If no, request that the patient contact the pharmacy for the refill. If patient does not wish to contact the pharmacy document the reason why and proceed with request.) (Agent: If yes, when and what did the pharmacy advise?)  Is this the correct pharmacy for this prescription? yes If no, delete pharmacy and type the correct one.  This is the patient's preferred pharmacy:  [facility] - [place], [place] - [address] [place] [address] Phone: [phone] Fax: [phone]     Has the prescription been filled recently? yes  Is the patient out of the medication? yes  Has the patient been seen for an appointment in the last year OR does the patient have an upcoming appointment? yes  Can we respond through MyChart? yes  Agent: Please be advised that Rx refills may take up to 3 business days. We ask that you follow-up with your pharmacy.

## 2023-05-28 ENCOUNTER — Other Ambulatory Visit: Payer: Self-pay | Admitting: Family Medicine

## 2023-05-28 MED ORDER — OXYCODONE HCL 5 MG PO TABS: 5.0000 mg | ORAL_TABLET | Freq: Four times a day (QID) | ORAL | 0 refills | Status: DC | PRN

## 2023-06-02 DIAGNOSIS — Z936 Other artificial openings of urinary tract status: Secondary | ICD-10-CM | POA: Diagnosis not present

## 2023-06-02 NOTE — Telephone Encounter (Signed)
Medication refilled in a separate encounter on 05/28/23.

## 2023-06-06 ENCOUNTER — Ambulatory Visit: Payer: 59 | Admitting: Urology

## 2023-06-06 VITALS — BP 122/80 | HR 75

## 2023-06-06 DIAGNOSIS — Z8551 Personal history of malignant neoplasm of bladder: Secondary | ICD-10-CM | POA: Diagnosis not present

## 2023-06-06 DIAGNOSIS — Z08 Encounter for follow-up examination after completed treatment for malignant neoplasm: Secondary | ICD-10-CM | POA: Diagnosis not present

## 2023-06-06 DIAGNOSIS — C679 Malignant neoplasm of bladder, unspecified: Secondary | ICD-10-CM

## 2023-06-06 LAB — URINALYSIS, ROUTINE W REFLEX MICROSCOPIC
Bilirubin, UA: NEGATIVE
Glucose, UA: NEGATIVE
Ketones, UA: NEGATIVE
Nitrite, UA: POSITIVE — AB
Protein,UA: NEGATIVE
Specific Gravity, UA: 1.015 (ref 1.005–1.030)
Urobilinogen, Ur: 0.2 mg/dL (ref 0.2–1.0)
pH, UA: 6 (ref 5.0–7.5)

## 2023-06-06 LAB — MICROSCOPIC EXAMINATION

## 2023-06-06 NOTE — Progress Notes (Unsigned)
06/06/2023 12:03 PM   [name] 1950-07-12 [id number]  Referring provider: Donita Brooks, MD [address] [place],  [place] [address]  No chief complaint on file.   HPI:    PMH: Past Medical History:  Diagnosis Date   Acid reflux    Bowel obstruction (HCC) several   Recurrent SBO secondary to adhesions   Cancer [facility]) 2011 [facility])   diagnosed in 2009 per pt. Invasive High grade Urothelial carcinoma- s/p radiation and Chemo    Chronic back pain    DVT (deep venous thrombosis) (HCC) 2015   Fatty liver    Gout    Hyperlipidemia    Hypertension    pt says taken off medication since has lost weight.   Hypothyroidism    Internal hemorrhoids    Leukopenia 12/06/2011   HIV serology negative   Malnutrition (HCC)    PE (pulmonary embolism)    [facility]   Rectal ulcer 2016   [facility]   UTI (lower urinary tract infection)    Recurrent    Surgical History: Past Surgical History:  Procedure Laterality Date   ABDOMINAL SURGERY     with intestinal "puncture" x 2, exploratory surgery    BACK SURGERY     X2   BLADDER REMOVAL     CHOLECYSTECTOMY     CYSTECTOMY     breast   HERNIA REPAIR     mesh   ILEO CONDUIT     For bladder cancer   PORTACATH PLACEMENT      Home Medications:  Allergies as of 06/06/2023       Reactions   Bactrim [sulfamethoxazole-trimethoprim]    Codeine Hives   Lactose Diarrhea   Nitrofuran Derivatives Itching   Sulfa Antibiotics Hives   Penicillins Rash   Has patient had a PCN reaction causing immediate rash, facial/tongue/throat swelling, SOB or lightheadedness with hypotension: No Has patient had a PCN reaction causing severe rash involving mucus membranes or skin necrosis: No Has patient had a PCN reaction that required hospitalization: No Has patient had a PCN reaction occurring within the last 10 years: No If all of the above answers are "NO", then may proceed with Cephalosporin use. Childhood  rash - no adult reactions known        Medication List        Accurate as of June 06, 2023 12:03 PM. If you have any questions, ask your nurse or doctor.          STOP taking these medications    benzonatate 200 MG capsule Commonly known as: TESSALON   diazepam 5 MG tablet Commonly known as: VALIUM   fluticasone 50 MCG/ACT nasal spray Commonly known as: FLONASE   Multi-Vitamin tablet   Multivitamin Adult (Minerals) Tabs   traZODone 50 MG tablet Commonly known as: DESYREL       TAKE these medications    colestipol 1 g tablet Commonly known as: COLESTID Take by mouth.   Cyanocobalamin 1000 MCG Caps Take 1 tablet by mouth daily.   dexlansoprazole 60 MG capsule Commonly known as: DEXILANT TAKE 1 CAPSULE BY MOUTH DAILY   diclofenac Sodium 1 % Gel Commonly known as: VOLTAREN APPLY TO AFFECTED AREAS 3 TIMES DAILY AS NEEDED.   doxycycline 100 MG tablet Commonly known as: VIBRA-TABS Take 1 tablet (100 mg total) by mouth 2 (two) times daily.   Eliquis 5 MG Tabs tablet Generic drug: apixaban TAKE 1 TABLET BY MOUTH TWICE  DAILY   furosemide 40 MG tablet Commonly known as: LASIX TAKE 1 TABLET BY MOUTH TWICE  DAILY AS NEEDED FOR EDEMA   levothyroxine 125 MCG tablet Commonly known as: SYNTHROID Take 1 tablet (125 mcg total) by mouth daily.   lidocaine 5 % ointment Commonly known as: XYLOCAINE APPLY TOPICALLY 4 TIMES  DAILY AS NEEDED   Linzess 145 MCG Caps capsule Generic drug: linaclotide TAKE 1 CAPSULE DAILY BEFORE BREAKFAST.   magnesium oxide 400 (240 Mg) MG tablet Commonly known as: MAG-OX Take 1.5 tablet BID   magnesium oxide 400 MG tablet Commonly known as: MAG-OX Take by mouth.   ondansetron 8 MG disintegrating tablet Commonly known as: ZOFRAN-ODT DISSOLVE 1 TABLET ON THE  TONGUE EVERY 8 HOURS AS  NEEDED FOR NAUSEA   oxyCODONE 5 MG immediate release tablet Commonly known as: Oxy IR/ROXICODONE Take 1 tablet (5 mg total) by mouth  every 6 (six) hours as needed for severe pain (pain score 7-10).   potassium chloride SA 20 MEQ tablet Commonly known as: KLOR-CON M TAKE 1 TABLET BY MOUTH DAILY   pravastatin 20 MG tablet Commonly known as: PRAVACHOL Take 1 tablet (20 mg total) by mouth daily.   pregabalin 75 MG capsule Commonly known as: Lyrica Take 1 capsule (75 mg total) by mouth 2 (two) times daily.   STOOL SOFTENER PLUS PO Take by mouth.   VITAMIN C PO Take 1 tablet by mouth daily.   Vitamin D2 50 MCG (2000 UT) Tabs tAKE 1 Tablet daily        Allergies:  Allergies  Allergen Reactions   Bactrim [Sulfamethoxazole-Trimethoprim]    Codeine Hives   Lactose Diarrhea   Nitrofuran Derivatives Itching   Sulfa Antibiotics Hives   Penicillins Rash    Has patient had a PCN reaction causing immediate rash, facial/tongue/throat swelling, SOB or lightheadedness with hypotension: No Has patient had a PCN reaction causing severe rash involving mucus membranes or skin necrosis: No Has patient had a PCN reaction that required hospitalization: No Has patient had a PCN reaction occurring within the last 10 years: No If all of the above answers are "NO", then may proceed with Cephalosporin use.  Childhood rash - no adult reactions known    Family History: Family History  Problem Relation Age of Onset   Heart disease Mother        enlarged   Hypertension Mother    Stroke Mother    Diabetes Father    Diabetes Sister    Diabetes Maternal Grandmother    Colon cancer Neg Hx     Social History:  reports that she quit smoking about 6 years ago. Her smoking use included cigarettes. She started smoking about 54 years ago. She has a 28.8 pack-year smoking history. She has never used smokeless tobacco. She reports that she does not drink alcohol and does not use drugs.  ROS: All other review of systems were reviewed and are negative except what is noted above in HPI  Physical Exam: BP 122/80   Pulse 75    Constitutional:  Alert and oriented, No acute distress. HEENT: Staves AT, moist mucus membranes.  Trachea midline, no masses. Cardiovascular: No clubbing, cyanosis, or edema. Respiratory: Normal respiratory effort, no increased work of breathing. GI: Abdomen is soft, nontender, nondistended, no abdominal masses GU: No CVA tenderness.  Lymph: No cervical or inguinal lymphadenopathy. Skin: No rashes, bruises or suspicious lesions. Neurologic: Grossly intact, no focal deficits, moving all 4 extremities. Psychiatric: Normal mood and affect.  Laboratory Data: Lab Results  Component Value Date   WBC 9.0 06/06/2022   HGB 13.4 06/06/2022   HCT 40.4 06/06/2022   MCV 78.0 (L) 06/06/2022   PLT 374 06/06/2022    Lab Results  Component Value Date   CREATININE 1.26 (H) 06/06/2022    No results found for: "PSA"  No results found for: "TESTOSTERONE"  Lab Results  Component Value Date   HGBA1C 6.0 (H) 06/06/2022    Urinalysis    Component Value Date/Time   COLORURINE YELLOW 03/18/2023 1458   APPEARANCEUR CLEAR 03/18/2023 1458   APPEARANCEUR Cloudy (A) 03/27/2022 1417   LABSPEC 1.015 03/18/2023 1458   PHURINE 6.0 03/18/2023 1458   GLUCOSEU NEGATIVE 03/18/2023 1458   HGBUR 1+ (A) 03/18/2023 1458   HGBUR small 05/22/2007 1334   BILIRUBINUR Negative 03/27/2022 1417   KETONESUR NEGATIVE 03/18/2023 1458   PROTEINUR TRACE (A) 03/18/2023 1458   UROBILINOGEN 0.2 02/08/2022 1952   UROBILINOGEN 0.2 02/09/2014 1435   NITRITE POSITIVE (A) 03/18/2023 1458   LEUKOCYTESUR 1+ (A) 03/18/2023 1458    Lab Results  Component Value Date   LABMICR See below: 03/27/2022   WBCUA >30 (A) 03/27/2022   LABEPIT 0-10 03/27/2022   MUCUS Present 03/28/2021   BACTERIA MODERATE (A) 03/18/2023    Pertinent Imaging: *** Results for orders placed during the hospital encounter of 12/10/16  DG Abd 1 View  Narrative CLINICAL DATA:  Diarrhea, rectal pain, and gas intermittently for the past 2 years.  History of surgical repair of bowel obstruction, urinary tract malignancy treated with radiation therapy and chemotherapy.  EXAM: ABDOMEN - 1 VIEW  COMPARISON:  Abdominopelvic CT scan of Nov 10, 2015  FINDINGS: The colonic stool burden is moderate. There is gas and stool in the rectum. There is no small or large bowel obstructive pattern. The bony structures exhibit no acute abnormalities.  IMPRESSION: No acute intra-abdominal abnormality is observed. Given the patient's history of thickening of the rectal wall, abdominal and pelvic CT scanning would be a useful next imaging step.   Electronically Signed By: David  [place] M.D. On: 12/10/2016 17:05  No results found for this or any previous visit.  No results found for this or any previous visit.  No results found for this or any previous visit.  Results for orders placed during the hospital encounter of 03/17/23  Ultrasound renal complete  Narrative CLINICAL DATA:  History of bladder cancer  EXAM: RENAL / URINARY TRACT ULTRASOUND COMPLETE  COMPARISON:  Renal ultrasound 03/28/2022  FINDINGS: Right Kidney:  Renal measurements: 9.5 x 4.8 x 5.3 cm = volume: 125 mL. Normal renal cortical thickness and echogenicity. There is a 9 mm cyst. No imaging follow-up needed.  Left Kidney:  Renal measurements: 9.2 x 5.1 x 4.4 cm = volume: 109.5 mL. Echogenicity within normal limits. No mass or hydronephrosis visualized.  Bladder:  Surgically absent  Other:  None.  IMPRESSION: No hydronephrosis.   Electronically Signed By: Annia Belt M.D. On: 03/17/2023 11:57  No valid procedures specified. No results found for this or any previous visit.  No results found for this or any previous visit.   Assessment & Plan:    1. Malignant neoplasm of urinary bladder, unspecified site [facility]) Followup 2 years with renal US - Urinalysis, Routine w reflex microscopic   No follow-ups on file.  Wilkie Aye,  MD  [facility] [place]

## 2023-06-10 ENCOUNTER — Encounter: Payer: Self-pay | Admitting: Urology

## 2023-06-10 NOTE — Patient Instructions (Signed)

## 2023-06-23 ENCOUNTER — Other Ambulatory Visit: Payer: Self-pay | Admitting: Family Medicine

## 2023-06-23 ENCOUNTER — Telehealth: Payer: Self-pay

## 2023-06-23 MED ORDER — OXYCODONE HCL 5 MG PO TABS: 5.0000 mg | ORAL_TABLET | Freq: Four times a day (QID) | ORAL | 0 refills | Status: DC | PRN

## 2023-06-23 NOTE — Telephone Encounter (Signed)
Copied from CRM [id number]. Topic: Clinical - Medication Refill >> Jun 23, 2023  9:24 AM Prudencio Pair wrote: Most Recent Primary Care Visit:  Provider: Kurtis Bushman S  Department: [facility]  Visit Type: OFFICE VISIT  Date: 03/18/2023  Medication: Oxycodone (Oxy IR/Roxicodone) 5 mg immediate release tablet  Has the patient contacted their pharmacy? No (Agent: If no, request that the patient contact the pharmacy for the refill. If patient does not wish to contact the pharmacy document the reason why and proceed with request.) (Agent: If yes, when and what did the pharmacy advise?)  Is this the correct pharmacy for this prescription? Yes If no, delete pharmacy and type the correct one.  This is the patient's preferred pharmacy:  [facility] - [place], [place] - [address] [place] [address] Phone: [phone] Fax: [phone]   Has the prescription been filled recently? Yes  Is the patient out of the medication? Yes  Has the patient been seen for an appointment in the last year OR does the patient have an upcoming appointment? Yes  Can we respond through MyChart? No  Agent: Please be advised that Rx refills may take up to 3 business days. We ask that you follow-up with your pharmacy.

## 2023-07-01 ENCOUNTER — Encounter: Payer: Self-pay | Admitting: Family Medicine

## 2023-07-01 ENCOUNTER — Ambulatory Visit (INDEPENDENT_AMBULATORY_CARE_PROVIDER_SITE_OTHER): Payer: 59 | Admitting: Family Medicine

## 2023-07-01 VITALS — BP 128/84 | HR 91 | Temp 97.8°F | Ht 61.0 in | Wt 191.4 lb

## 2023-07-01 DIAGNOSIS — Z1211 Encounter for screening for malignant neoplasm of colon: Secondary | ICD-10-CM

## 2023-07-01 DIAGNOSIS — I10 Essential (primary) hypertension: Secondary | ICD-10-CM

## 2023-07-01 DIAGNOSIS — R7303 Prediabetes: Secondary | ICD-10-CM

## 2023-07-01 DIAGNOSIS — Z0001 Encounter for general adult medical examination with abnormal findings: Secondary | ICD-10-CM | POA: Diagnosis not present

## 2023-07-01 DIAGNOSIS — R948 Abnormal results of function studies of other organs and systems: Secondary | ICD-10-CM | POA: Diagnosis not present

## 2023-07-01 DIAGNOSIS — E039 Hypothyroidism, unspecified: Secondary | ICD-10-CM

## 2023-07-01 DIAGNOSIS — Z1231 Encounter for screening mammogram for malignant neoplasm of breast: Secondary | ICD-10-CM

## 2023-07-01 MED ORDER — LOSARTAN POTASSIUM 50 MG PO TABS: 50.0000 mg | ORAL_TABLET | Freq: Every day | ORAL | 3 refills | Status: DC

## 2023-07-01 MED ORDER — TRAZODONE HCL 50 MG PO TABS: 50.0000 mg | ORAL_TABLET | Freq: Every day | ORAL | 1 refills | Status: DC

## 2023-07-01 MED ORDER — FLUTICASONE PROPIONATE 50 MCG/ACT NA SUSP: 2.0000 | Freq: Every day | NASAL | 6 refills | Status: DC

## 2023-07-01 NOTE — Progress Notes (Addendum)
 Subjective:    Patient ID: [name], female    DOB: 10-08-1950, 72 y.o.   MRN: [id number]  HPI  Patient is a very pleasant 72 year old African-American female with a very complicated past medical history.  She states that in the late 90s around 1998 she had back surgery.  At that time she had a accidental perforation in her intestines during the back surgery that required surgical correction.  Then in 2012 she was found to have bladder cancer.  She underwent surgical treatment of her bladder cancer and suffered a perforation to her intestines as well.  Ultimately she developed a small bowel obstruction due to adhesions and was unable to eat.  She states she lost more than 100 pounds.  She ultimately was admitted to [facility] and underwent TPN.  She required TPN for 3 years from 2012 for 2015 due to inability to tolerate oral food because of bowel obstructions.  Ultimately she underwent surgery in 2015 after she developed a fistula at the stoma of her bladder after her bladder was removed due to bladder cancer.  During the surgery to correct her urostomy, the patient also went lysis of adhesions.  At that point the bowel obstruction resolved and the patient was able to start tolerating oral food again.  She is on Eliquis  due to recurrent DVTs in her upper extremities stemming from her long-term use of TPN.  She is on chronic pain medication.  Patient has terrible rectal pain.  She states that it feels like she is being torn in half every time she has a bowel movement.  She had pelvic floor muscular dysfunction due to the severity of the pain and she is working with a specialist in [place].  Prior to working with a specialist for pelvic floor dysfunction, the patient was having 30 bowel movements a day that were searing and tearing with pain.  She has seen numerous specialist who cannot find anything wrong however she continues to have horrific pain with defecation.  However the specialist has  been able to reduce her frequency of BMs to 5 or 6 a day which is made her quality of life much better.  She still takes MS Contin  twice a day and oxycodone  2-3 times a day due to the severity of pain.  A lot of her pain sounds neuropathic in nature versus also rectal fissures due to the end complete relaxation of the anal sphincter.    The above was copied from a previous visit as a summary of her PMH.  Patient is here today because she wanted clarification on some recent test.  She recently had a CT scan screening for lung cancer.  Unfortunately the scan revealed nodular opacity in the left lower lobe periphery.  Radiology was not certain that this could be inflammation, infection, or possible malignancy.  Also.  PET scan patient has been scheduled for such.  However, she was confused and wanted to discuss it more.  At that time, my plan was: Together we reviewed the images.  I explained the principal of the PET scan and how this could help differentiate scar tissue from malignancy.  This would help us  make decisions whether she needed a biopsy or whether the area can be monitored.  I answered all her questions as best I could.  She would like something that she could take prior to the procedure for anxiety.  I gave her a one-time prescription for Valium .  07/01/23 Patient is here today for  complete physical exam.  Of note, the patient's PET scan revealed no hypermetabolic activity in the lesion seen in her lung.  Pulmonology recommended a repeat CAT scan in August 2025.  However, there was hypermetabolic activity in the lesion seen within the colon.  Pulmonology recommended that she follow-up with me to schedule colonoscopy.  Patient returns today for follow-up 4 months later.  I explained to the patient that there is a very concerning lesion seen in her colon.  She needs a colonoscopy.  However the patient has had perforations on 2 separate occasions.  Therefore she is afraid to have a colonoscopy.  She  would be willing to undergo a capsule endoscopy.  This needs to happen as soon as possible.  Therefore I recommended that she see GI immediately.  She is also overdue for a mammogram.  The patient is also due for a flu shot, a pneumonia vaccine, and a shingles vaccine.  She declines all vaccinations today.  She is overdue to monitor her blood sugar.  She is also overdue to check a TSH to monitor her dose to be followed up.  Blood pressure elevated.  I repeated her blood pressure personally and found to be 150/94.   Past Medical History:  Diagnosis Date   Acid reflux    Bowel obstruction (HCC) several   Recurrent SBO secondary to adhesions   Cancer [facility]) 2011 [facility])   diagnosed in 2009 per pt. Invasive High grade Urothelial carcinoma- s/p radiation and Chemo    Chronic back pain    DVT (deep venous thrombosis) (HCC) 2015   Fatty liver    Gout    Hyperlipidemia    Hypertension    pt says taken off medication since has lost weight.   Hypothyroidism    Internal hemorrhoids    Leukopenia 12/06/2011   HIV serology negative   Malnutrition (HCC)    PE (pulmonary embolism)    PER [facility] records   Rectal ulcer 2016   [facility]   UTI (lower urinary tract infection)    Recurrent   Past Surgical History:  Procedure Laterality Date   ABDOMINAL SURGERY     with intestinal puncture x 2, exploratory surgery    BACK SURGERY     X2   BLADDER REMOVAL     CHOLECYSTECTOMY     CYSTECTOMY     breast   HERNIA REPAIR     mesh   ILEO CONDUIT     For bladder cancer   PORTACATH PLACEMENT     Current Outpatient Medications on File Prior to Visit  Medication Sig Dispense Refill   apixaban  (ELIQUIS ) 5 MG TABS tablet TAKE 1 TABLET BY MOUTH TWICE  DAILY 200 tablet 1   Ascorbic Acid (VITAMIN C PO) Take 1 tablet by mouth daily.     Casanthranol-Docusate Sodium  (STOOL SOFTENER PLUS PO) Take by mouth.     colestipol (COLESTID) 1 g tablet Take by mouth.     Cyanocobalamin  1000 MCG CAPS Take  1 tablet by mouth daily. 90 capsule 3   dexlansoprazole  (DEXILANT ) 60 MG capsule TAKE 1 CAPSULE BY MOUTH DAILY 100 capsule 0   diclofenac  Sodium (VOLTAREN ) 1 % GEL APPLY TO AFFECTED AREAS 3 TIMES DAILY AS NEEDED. 100 g 0   Ergocalciferol (VITAMIN D2) 50 MCG (2000 UT) TABS tAKE 1 Tablet daily 30 tablet    furosemide  (LASIX ) 40 MG tablet TAKE 1 TABLET BY MOUTH TWICE  DAILY AS NEEDED FOR EDEMA 200 tablet 2  levothyroxine  (SYNTHROID ) 125 MCG tablet Take 1 tablet (125 mcg total) by mouth daily. 90 tablet 3   lidocaine  (XYLOCAINE ) 5 % ointment APPLY TOPICALLY 4 TIMES  DAILY AS NEEDED 354.4 g 0   linaclotide  (LINZESS ) 145 MCG CAPS capsule TAKE 1 CAPSULE DAILY BEFORE BREAKFAST. 90 capsule 0   magnesium  oxide (MAG-OX) 400 MG tablet Take by mouth.     Magnesium  Oxide 400 (240 Mg) MG TABS Take 1.5 tablet BID 90 tablet 2   ondansetron  (ZOFRAN -ODT) 8 MG disintegrating tablet DISSOLVE 1 TABLET ON THE  TONGUE EVERY 8 HOURS AS  NEEDED FOR NAUSEA 90 tablet 3   oxyCODONE  (OXY IR/ROXICODONE ) 5 MG immediate release tablet Take 1 tablet (5 mg total) by mouth every 6 (six) hours as needed for severe pain (pain score 7-10). 120 tablet 0   potassium chloride  SA (KLOR-CON  M) 20 MEQ tablet TAKE 1 TABLET BY MOUTH DAILY 100 tablet 1   pravastatin  (PRAVACHOL ) 20 MG tablet Take 1 tablet (20 mg total) by mouth daily. 100 tablet 1   No current facility-administered medications on file prior to visit.   Allergies  Allergen Reactions   Bactrim [Sulfamethoxazole-Trimethoprim]    Codeine Hives   Lactose Diarrhea   Nitrofuran Derivatives Itching   Sulfa Antibiotics Hives   Penicillins Rash    Has patient had a PCN reaction causing immediate rash, facial/tongue/throat swelling, SOB or lightheadedness with hypotension: No Has patient had a PCN reaction causing severe rash involving mucus membranes or skin necrosis: No Has patient had a PCN reaction that required hospitalization: No Has patient had a PCN reaction occurring  within the last 10 years: No If all of the above answers are NO, then may proceed with Cephalosporin use.  Childhood rash - no adult reactions known   Social History   Socioeconomic History   Marital status: Widowed    Spouse name: Not on file   Number of children: 2   Years of education: Not on file   Highest education level: Not on file  Occupational History    Employer: UNEMPLOYED  Tobacco Use   Smoking status: Former    Current packs/day: 0.00    Average packs/day: 0.6 packs/day for 48.0 years (28.8 ttl pk-yrs)    Types: Cigarettes    Start date: 10/03/1968    Quit date: 10/03/2016    Years since quitting: 6.7   Smokeless tobacco: Never  Vaping Use   Vaping status: Never Used  Substance and Sexual Activity   Alcohol use: No   Drug use: No   Sexual activity: Yes    Birth control/protection: Post-menopausal  Other Topics Concern   Not on file  Social History Narrative   Not on file   Social Drivers of Health   Financial Resource Strain: Medium Risk (08/09/2022)   Overall Financial Resource Strain (CARDIA)    Difficulty of Paying Living Expenses: Somewhat hard  Food Insecurity: No Food Insecurity (08/09/2022)   Hunger Vital Sign    Worried About Running Out of Food in the Last Year: Never true    Ran Out of Food in the Last Year: Never true  Transportation Needs: Unmet Transportation Needs (08/09/2022)   PRAPARE - Transportation    Lack of Transportation (Medical): Yes    Lack of Transportation (Non-Medical): Yes  Physical Activity: Insufficiently Active (08/09/2022)   Exercise Vital Sign    Days of Exercise per Week: 3 days    Minutes of Exercise per Session: 20 min  Stress: No Stress Concern Present (  08/09/2022)   [employer] - Occupational Stress Questionnaire    Feeling of Stress : Not at all  Social Connections: Unknown (08/09/2022)   Social Connection and Isolation Panel [NHANES]    Frequency of Communication with Friends and Family:  Once a week    Frequency of Social Gatherings with Friends and Family: Once a week    Attends Religious Services: Not on Marketing Executive or Organizations: Yes    Attends Banker Meetings: 1 to 4 times per year    Marital Status: Widowed  Intimate Partner Violence: Not At Risk (08/09/2022)   Humiliation, Afraid, Rape, and Kick questionnaire    Fear of Current or Ex-Partner: No    Emotionally Abused: No    Physically Abused: No    Sexually Abused: No       Review of Systems  All other systems reviewed and are negative.      Objective:   Physical Exam Vitals reviewed.  Eyes:     General: No scleral icterus.       Right eye: No discharge.        Left eye: No discharge.     Conjunctiva/sclera: Conjunctivae normal.     Pupils: Pupils are equal, round, and reactive to light.  Cardiovascular:     Rate and Rhythm: Normal rate and regular rhythm.     Heart sounds: Normal heart sounds. No murmur heard. Pulmonary:     Effort: Pulmonary effort is normal. No respiratory distress.     Breath sounds: Normal breath sounds. No wheezing or rales.  Chest:     Chest wall: No tenderness.  Abdominal:     General: Bowel sounds are normal. There is no distension.     Palpations: Abdomen is soft. There is no mass.     Tenderness: There is no abdominal tenderness. There is no guarding or rebound.  Skin:    General: Skin is warm.     Findings: No erythema or rash.  Neurological:     Mental Status: She is alert and oriented to person, place, and time.     Cranial Nerves: No cranial nerve deficit.     Motor: No abnormal muscle tone.     Coordination: Coordination normal.     Deep Tendon Reflexes: Reflexes are normal and symmetric.    No bruit       Assessment & Plan:  Encounter for screening mammogram for malignant neoplasm of breast - Plan: MM 3D SCREENING MAMMOGRAM BILATERAL BREAST  Colon cancer screening - Plan: Ambulatory referral to  Gastroenterology  Essential hypertension  Borderline diabetes - Plan: CBC with Differential/Platelet, COMPLETE METABOLIC PANEL WITH GFR, Lipid panel, Hemoglobin A1c  Hypothyroidism, unspecified type - Plan: TSH  Abnormal PET scan of colon - Plan: Ambulatory referral to Gastroenterology First, my primary concern, is the abnormal lesion seen in the colon.  Therefore I placed a referral to GI to schedule the patient for capsule endoscopy.  I emphasized to the patient that there may be a malignancy in the colon and that we need to evaluate this further.  I do not want her to be lost to follow-up.  Patient understands.  She is also due for a mammogram.  I will schedule her for this blood pressure is elevated today.  Given her history of chronic kidney disease and borderline diabetes I will start losartan  50 mg a day.  Recheck here in 2 to 4 weeks to ensure blood  pressure has improved.  Meanwhile check CBC CMP lipid panel and hemoglobin A1c.  Goal A1c is less than 6.5.  Check TSH to ensure adequate dosage of levothyroxine .  Patient also request a refill on Valium  to help her sleep.  I recommended against this due to the fact she is on narcotics for chronic abdominal pain.  Therefore I recommended we try trazodone  50 mg p.o. nightly for insomnia.

## 2023-07-02 LAB — COMPLETE METABOLIC PANEL WITH GFR
AG Ratio: 1.4 (calc) (ref 1.0–2.5)
ALT: 10 U/L (ref 6–29)
AST: 10 U/L (ref 10–35)
Albumin: 4.3 g/dL (ref 3.6–5.1)
Alkaline phosphatase (APISO): 117 U/L (ref 37–153)
BUN/Creatinine Ratio: 14 (calc) (ref 6–22)
BUN: 18 mg/dL (ref 7–25)
CO2: 25 mmol/L (ref 20–32)
Calcium: 9.8 mg/dL (ref 8.6–10.4)
Chloride: 103 mmol/L (ref 98–110)
Creat: 1.28 mg/dL — ABNORMAL HIGH (ref 0.60–1.00)
Globulin: 3.1 g/dL (ref 1.9–3.7)
Glucose, Bld: 122 mg/dL — ABNORMAL HIGH (ref 65–99)
Potassium: 3.8 mmol/L (ref 3.5–5.3)
Sodium: 140 mmol/L (ref 135–146)
Total Bilirubin: 0.6 mg/dL (ref 0.2–1.2)
Total Protein: 7.4 g/dL (ref 6.1–8.1)
eGFR: 45 mL/min/{1.73_m2} — ABNORMAL LOW (ref >=60)

## 2023-07-02 LAB — CBC WITH DIFFERENTIAL/PLATELET
Absolute Lymphocytes: 1909 {cells}/uL (ref 850–3900)
Absolute Monocytes: 889 {cells}/uL (ref 200–950)
Basophils Absolute: 30 {cells}/uL (ref 0–200)
Basophils Relative: 0.3 %
Eosinophils Absolute: 172 {cells}/uL (ref 15–500)
Eosinophils Relative: 1.7 %
HCT: 42.5 % (ref 35.0–45.0)
Hemoglobin: 13.4 g/dL (ref 11.7–15.5)
MCH: 25.2 pg — ABNORMAL LOW (ref 27.0–33.0)
MCHC: 31.5 g/dL — ABNORMAL LOW (ref 32.0–36.0)
MCV: 80 fL (ref 80.0–100.0)
MPV: 10.6 fL (ref 7.5–12.5)
Monocytes Relative: 8.8 %
Neutro Abs: 7100 {cells}/uL (ref 1500–7800)
Neutrophils Relative %: 70.3 %
Platelets: 361 10*3/uL (ref 140–400)
RBC: 5.31 10*6/uL — ABNORMAL HIGH (ref 3.80–5.10)
RDW: 15.6 % — ABNORMAL HIGH (ref 11.0–15.0)
Total Lymphocyte: 18.9 %
WBC: 10.1 10*3/uL (ref 3.8–10.8)

## 2023-07-02 LAB — HEMOGLOBIN A1C
Hgb A1c MFr Bld: 6 %{Hb} — ABNORMAL HIGH (ref <5.7)
Mean Plasma Glucose: 126 mg/dL
eAG (mmol/L): 7 mmol/L

## 2023-07-02 LAB — LIPID PANEL
Cholesterol: 184 mg/dL (ref <200)
HDL: 72 mg/dL (ref >=50)
LDL Cholesterol (Calc): 88 mg/dL
Non-HDL Cholesterol (Calc): 112 mg/dL (ref <130)
Total CHOL/HDL Ratio: 2.6 (calc) (ref <5.0)
Triglycerides: 138 mg/dL (ref <150)

## 2023-07-02 LAB — TSH: TSH: 0.5 m[IU]/L (ref 0.40–4.50)

## 2023-07-10 ENCOUNTER — Telehealth: Payer: Self-pay | Admitting: Family Medicine

## 2023-07-10 DIAGNOSIS — Z936 Other artificial openings of urinary tract status: Secondary | ICD-10-CM | POA: Diagnosis not present

## 2023-07-10 NOTE — Telephone Encounter (Signed)
 Copied from CRM [id number]. Topic: Referral - Status >> Jul 10, 2023 12:58 PM Deleta HERO wrote: Reason for CRM: The patient was concerned because she has not received a callback to schedule for GI endoscopy and mammogram at [facility]. She would like to determine when she will receive an update on this referral? Callback #: [phone]  Spoke with patient and gave her the contact information on her GI referral for an endoscopy at Dr. Jerrell Dare office.  Patient requesting a referral to have her mammogram done.   Please advise patient when referral sent at [phone].

## 2023-07-15 DIAGNOSIS — E039 Hypothyroidism, unspecified: Secondary | ICD-10-CM | POA: Diagnosis not present

## 2023-07-15 DIAGNOSIS — I517 Cardiomegaly: Secondary | ICD-10-CM | POA: Diagnosis not present

## 2023-07-15 DIAGNOSIS — Z743 Need for continuous supervision: Secondary | ICD-10-CM | POA: Diagnosis not present

## 2023-07-15 DIAGNOSIS — I10 Essential (primary) hypertension: Secondary | ICD-10-CM | POA: Diagnosis not present

## 2023-07-15 DIAGNOSIS — R6889 Other general symptoms and signs: Secondary | ICD-10-CM | POA: Diagnosis not present

## 2023-07-15 DIAGNOSIS — Z87891 Personal history of nicotine dependence: Secondary | ICD-10-CM | POA: Diagnosis not present

## 2023-07-15 DIAGNOSIS — I700 Atherosclerosis of aorta: Secondary | ICD-10-CM | POA: Diagnosis not present

## 2023-07-15 DIAGNOSIS — R079 Chest pain, unspecified: Secondary | ICD-10-CM | POA: Diagnosis not present

## 2023-07-15 DIAGNOSIS — R0602 Shortness of breath: Secondary | ICD-10-CM | POA: Diagnosis not present

## 2023-07-15 DIAGNOSIS — E785 Hyperlipidemia, unspecified: Secondary | ICD-10-CM | POA: Diagnosis not present

## 2023-07-22 DIAGNOSIS — R072 Precordial pain: Secondary | ICD-10-CM | POA: Diagnosis not present

## 2023-07-22 DIAGNOSIS — R079 Chest pain, unspecified: Secondary | ICD-10-CM | POA: Diagnosis not present

## 2023-07-25 ENCOUNTER — Other Ambulatory Visit: Payer: Self-pay | Admitting: Family Medicine

## 2023-07-29 ENCOUNTER — Encounter: Payer: Self-pay | Admitting: Family Medicine

## 2023-07-29 ENCOUNTER — Ambulatory Visit (INDEPENDENT_AMBULATORY_CARE_PROVIDER_SITE_OTHER): Payer: 59 | Admitting: Family Medicine

## 2023-07-29 VITALS — BP 127/76 | HR 88 | Temp 97.8°F | Ht 61.0 in | Wt 195.2 lb

## 2023-07-29 DIAGNOSIS — R948 Abnormal results of function studies of other organs and systems: Secondary | ICD-10-CM | POA: Diagnosis not present

## 2023-07-29 DIAGNOSIS — I10 Essential (primary) hypertension: Secondary | ICD-10-CM | POA: Diagnosis not present

## 2023-07-29 DIAGNOSIS — R0789 Other chest pain: Secondary | ICD-10-CM

## 2023-07-29 MED ORDER — OXYCODONE HCL 5 MG PO TABS: 5.0000 mg | ORAL_TABLET | Freq: Four times a day (QID) | ORAL | 0 refills | Status: DC | PRN

## 2023-07-29 NOTE — Progress Notes (Signed)
++    +++++++ +    +            +  Subjective:    Patient ID: [name], female    DOB: 01-Feb-1951, 73 y.o.   MRN: [id number]  HPI  Patient is a very pleasant 73 year old African-American female with a very complicated past medical history.  She states that in the late 90s around 1998 she had back surgery.  At that time she had a accidental perforation in her intestines during the back surgery that required surgical correction.  Then in 2012 she was found to have bladder cancer.  She underwent surgical treatment of her bladder cancer and suffered a perforation to her intestines as well.  Ultimately she developed a small bowel obstruction due to adhesions and was unable to eat.  She states she lost more than 100 pounds.  She ultimately was admitted to [facility] and underwent TPN.  She required TPN for 3 years from 2012 for 2015 due to inability to tolerate oral food because of bowel obstructions.  Ultimately she underwent surgery in 2015 after she developed a fistula at the stoma of her bladder after her bladder was removed due to bladder cancer.  During the surgery to correct her urostomy, the patient also went lysis of adhesions.  At that point the bowel obstruction resolved and the patient was able to start tolerating oral food again.  She is on Eliquis due to recurrent DVTs in her upper extremities stemming from her long-term use of TPN.  She is on chronic pain medication.  Patient has terrible rectal pain.  She states that it feels like she is being torn in half every time she has a bowel movement.  She had pelvic floor muscular dysfunction due to the severity of the pain and she is working with a specialist in [place].  Prior to working with a specialist for pelvic floor dysfunction, the patient was having 30 bowel movements a day that were [name] and tearing with pain.  She has seen numerous specialist who "cannot find anything wrong" however she continues to have  horrific pain with defecation.  However the specialist has been able to reduce her frequency of BMs to 5 or 6 a day which is made her quality of life much better.  She still takes MS Contin twice a day and oxycodone 2-3 times a day due to the severity of pain.  A lot of her pain sounds neuropathic in nature versus also rectal fissures due to the end complete relaxation of the anal sphincter.    The above was copied from a previous visit as a summary of her PMH.  Patient is here today because she wanted clarification on some recent test.  She recently had a CT scan screening for lung cancer.  Unfortunately the scan revealed nodular opacity in the left lower lobe periphery.  Radiology was not certain that this could be inflammation, infection, or possible malignancy.  Also.  PET scan patient has been scheduled for such.  However, she was confused and wanted to discuss it more.  At that time, my plan was: Together we reviewed the images.  I explained the principal of the PET scan and how this could help differentiate scar tissue from malignancy.  This would help [place] make decisions whether she needed a biopsy or whether the area can be monitored.  I answered all her questions as best I could.  She would like something that she could take  prior to the procedure for anxiety.  I gave her a one-time prescription for Valium.  07/01/23 Patient is here today for complete physical exam.  Of note, the patient's PET scan revealed no hypermetabolic activity in the lesion seen in her lung.  Pulmonology recommended a repeat CAT scan in August 2025.  However, there was hypermetabolic activity in the lesion seen within the colon.  Pulmonology recommended that she follow-up with me to schedule colonoscopy.  Patient returns today for follow-up 4 months later.  I explained to the patient that there is a very concerning lesion seen in her colon.  She needs a colonoscopy.  However the patient has had perforations on 2 separate  occasions.  Therefore she is afraid to have a colonoscopy.  She would be willing to undergo a capsule endoscopy.  This needs to happen as soon as possible.  Therefore I recommended that she see GI immediately.  She is also overdue for a mammogram.  The patient is also due for a flu shot, a pneumonia vaccine, and a shingles vaccine.  She declines all vaccinations today.  She is overdue to monitor her blood sugar.  She is also overdue to check a TSH to monitor her dose to be followed up.  Blood pressure elevated.  I repeated her blood pressure personally and found to be 150/94.  At that time, my plan was: First, my primary concern, is the abnormal lesion seen in the colon.  Therefore I placed a referral to GI to schedule the patient for capsule endoscopy.  I emphasized to the patient that there may be a malignancy in the colon and that we need to evaluate this further.  I do not want her to be lost to follow-up.  Patient understands.  She is also due for a mammogram.  I will schedule her for this blood pressure is elevated today.  Given her history of chronic kidney disease and borderline diabetes I will start losartan 50 mg a day.  Recheck here in 2 to 4 weeks to ensure blood pressure has improved.  Meanwhile check CBC CMP lipid panel and hemoglobin A1c.  Goal A1c is less than 6.5.  Check TSH to ensure adequate dosage of levothyroxine.  Patient also request a refill on Valium to help her sleep.  I recommended against this due to the fact she is on narcotics for chronic abdominal pain.  Therefore I recommended we try trazodone 50 mg p.o. nightly for insomnia.  07/29/23 Since I last saw the patient, she went to an outside hospital for substernal chest pain.  She underwent a rule out for acute coronary syndrome.  She was referred to a cardiologist in the [facility] system and they are scheduling the patient for a stress test next month.  She has not had any further episodes of chest pain.  However she states that she has  not heard anything from the gastroenterologist office regarding her last office visit despite me placing the referral.  However I am concerned that the pain is not answering the phone.  She also has not heard anything about scheduling her mammogram.  However he states that she gets spam calls every day so she often does not answer her telephone.  Therefore I asked my nurse to get her the contact information for the gastroenterologist office so that she can reach out and call them to help get this appointment scheduled as I am concerned that the lesion seen on the PET scan is being ignored.  Today  her blood pressure is excellent.  She is requesting a refill on her pain medication that she takes for chronic pelvic pain.   Past Medical History:  Diagnosis Date   Acid reflux    Bowel obstruction (HCC) several   Recurrent SBO secondary to adhesions   Cancer [facility]) 2011 [facility])   diagnosed in 2009 per pt. Invasive High grade Urothelial carcinoma- s/p radiation and Chemo    Chronic back pain    DVT (deep venous thrombosis) (HCC) 2015   Fatty liver    Gout    Hyperlipidemia    Hypertension    pt says taken off medication since has lost weight.   Hypothyroidism    Internal hemorrhoids    Leukopenia 12/06/2011   HIV serology negative   Malnutrition (HCC)    PE (pulmonary embolism)    PER [facility]   Rectal ulcer 2016   [facility]   UTI (lower urinary tract infection)    Recurrent   Past Surgical History:  Procedure Laterality Date   ABDOMINAL SURGERY     with intestinal "puncture" x 2, exploratory surgery    BACK SURGERY     X2   BLADDER REMOVAL     CHOLECYSTECTOMY     CYSTECTOMY     breast   HERNIA REPAIR     mesh   ILEO CONDUIT     For bladder cancer   PORTACATH PLACEMENT     Current Outpatient Medications on File Prior to Visit  Medication Sig Dispense Refill   apixaban (ELIQUIS) 5 MG TABS tablet TAKE 1 TABLET BY MOUTH TWICE  DAILY 200 tablet 1   Ascorbic Acid  (VITAMIN C PO) Take 1 tablet by mouth daily.     Casanthranol-Docusate Sodium (STOOL SOFTENER PLUS PO) Take by mouth.     colestipol (COLESTID) 1 g tablet Take by mouth.     Cyanocobalamin 1000 MCG CAPS Take 1 tablet by mouth daily. 90 capsule 3   dexlansoprazole (DEXILANT) 60 MG capsule TAKE 1 CAPSULE BY MOUTH DAILY 100 capsule 2   diclofenac Sodium (VOLTAREN) 1 % GEL APPLY TO AFFECTED AREAS 3 TIMES DAILY AS NEEDED. 100 g 0   Ergocalciferol (VITAMIN D2) 50 MCG (2000 UT) TABS tAKE 1 Tablet daily 30 tablet    fluticasone (FLONASE) 50 MCG/ACT nasal spray Place 2 sprays into both nostrils daily. 16 g 6   furosemide (LASIX) 40 MG tablet TAKE 1 TABLET BY MOUTH TWICE  DAILY AS NEEDED FOR EDEMA 200 tablet 2   levothyroxine (SYNTHROID) 125 MCG tablet Take 1 tablet (125 mcg total) by mouth daily. 90 tablet 3   lidocaine (XYLOCAINE) 5 % ointment APPLY TOPICALLY 4 TIMES  DAILY AS NEEDED 354.4 g 0   linaclotide (LINZESS) 145 MCG CAPS capsule TAKE 1 CAPSULE DAILY BEFORE BREAKFAST. 90 capsule 0   losartan (COZAAR) 50 MG tablet Take 1 tablet (50 mg total) by mouth daily. 90 tablet 3   magnesium oxide (MAG-OX) 400 MG tablet Take by mouth.     Magnesium Oxide 400 (240 Mg) MG TABS Take 1.5 tablet BID 90 tablet 2   ondansetron (ZOFRAN-ODT) 8 MG disintegrating tablet DISSOLVE 1 TABLET ON THE  TONGUE EVERY 8 HOURS AS  NEEDED FOR NAUSEA 90 tablet 3   oxyCODONE (OXY IR/ROXICODONE) 5 MG immediate release tablet Take 1 tablet (5 mg total) by mouth every 6 (six) hours as needed for severe pain (pain score 7-10). 120 tablet 0   potassium chloride SA (KLOR-CON M) 20 MEQ  tablet TAKE 1 TABLET BY MOUTH DAILY 100 tablet 1   pravastatin (PRAVACHOL) 20 MG tablet Take 1 tablet (20 mg total) by mouth daily. 100 tablet 1   traZODone (DESYREL) 50 MG tablet Take 1 tablet (50 mg total) by mouth at bedtime. 30 tablet 1   No current facility-administered medications on file prior to visit.   Allergies  Allergen Reactions   Bactrim  [Sulfamethoxazole-Trimethoprim]    Codeine Hives   Lactose Diarrhea   Nitrofuran Derivatives Itching   Sulfa Antibiotics Hives   Penicillins Rash    Has patient had a PCN reaction causing immediate rash, facial/tongue/throat swelling, SOB or lightheadedness with hypotension: No Has patient had a PCN reaction causing severe rash involving mucus membranes or skin necrosis: No Has patient had a PCN reaction that required hospitalization: No Has patient had a PCN reaction occurring within the last 10 years: No If all of the above answers are "NO", then may proceed with Cephalosporin use.  Childhood rash - no adult reactions known   Social History   Socioeconomic History   Marital status: Widowed    Spouse name: Not on file   Number of children: 2   Years of education: Not on file   Highest education level: Not on file  Occupational History    Employer: UNEMPLOYED  Tobacco Use   Smoking status: Former    Current packs/day: 0.00    Average packs/day: 0.6 packs/day for 48.0 years (28.8 ttl pk-yrs)    Types: Cigarettes    Start date: 10/03/1968    Quit date: 10/03/2016    Years since quitting: 6.8   Smokeless tobacco: Never  Vaping Use   Vaping status: Never Used  Substance and Sexual Activity   Alcohol use: No   Drug use: No   Sexual activity: Yes    Birth control/protection: Post-menopausal  Other Topics Concern   Not on file  Social History Narrative   Not on file   Social Drivers of Health   Financial Resource Strain: Medium Risk (08/09/2022)   Overall Financial Resource Strain (CARDIA)    Difficulty of Paying Living Expenses: Somewhat hard  Food Insecurity: No Food Insecurity (08/09/2022)   Hunger Vital Sign    Worried About Running Out of Food in the Last Year: Never true    Ran Out of Food in the Last Year: Never true  Transportation Needs: Unmet Transportation Needs (08/09/2022)   PRAPARE - Transportation    Lack of Transportation (Medical): Yes    Lack of  Transportation (Non-Medical): Yes  Physical Activity: Insufficiently Active (08/09/2022)   Exercise Vital Sign    Days of Exercise per Week: 3 days    Minutes of Exercise per Session: 20 min  Stress: No Stress Concern Present (08/09/2022)   [employer] of Occupational Health - Occupational Stress Questionnaire    Feeling of Stress : Not at all  Social Connections: Unknown (08/09/2022)   Social Connection and Isolation Panel [NHANES]    Frequency of Communication with Friends and Family: Once a week    Frequency of Social Gatherings with Friends and Family: Once a week    Attends Religious Services: Not on Insurance claims handler of Clubs or Organizations: Yes    Attends Banker Meetings: 1 to 4 times per year    Marital Status: Widowed  Intimate Partner Violence: Not At Risk (08/09/2022)   Humiliation, Afraid, Rape, and Kick questionnaire    Fear of Current or Ex-Partner: No  Emotionally Abused: No    Physically Abused: No    Sexually Abused: No       Review of Systems  All other systems reviewed and are negative.      Objective:   Physical Exam Vitals reviewed.  Eyes:     General: No scleral icterus.       Right eye: No discharge.        Left eye: No discharge.     Conjunctiva/sclera: Conjunctivae normal.     Pupils: Pupils are equal, round, and reactive to light.  Cardiovascular:     Rate and Rhythm: Normal rate and regular rhythm.     Heart sounds: Normal heart sounds. No murmur heard. Pulmonary:     Effort: Pulmonary effort is normal. No respiratory distress.     Breath sounds: Normal breath sounds. No wheezing or rales.  Chest:     Chest wall: No tenderness.  Abdominal:     General: Bowel sounds are normal. There is no distension.     Palpations: Abdomen is soft. There is no mass.     Tenderness: There is no abdominal tenderness. There is no guarding or rebound.  Skin:    General: Skin is warm.     Findings: No erythema or rash.  Neurological:      Mental Status: She is alert and oriented to person, place, and time.     Cranial Nerves: No cranial nerve deficit.     Motor: No abnormal muscle tone.     Coordination: Coordination normal.     Deep Tendon Reflexes: Reflexes are normal and symmetric.    No bruit       Assessment & Plan:  Essential hypertension - Plan: BASIC METABOLIC PANEL WITH GFR  Atypical chest pain  Abnormal PET scan of colon Patient was provided with the contact information for the gastroenterologist office so she could follow-up on her appointment given her abnormal PET scan of the colon.  She needs a colonoscopy.  I explained that to her again.  The chest pain sounds very atypical however she is scheduled to see cardiology in 1 February for a stress test.  Her blood pressure today is excellent.  I will check a BMP to monitor her potassium potassium at the hospital recently was low when she went for ACS evaluation.  I did refill her oxycodone that she takes for chronic pelvic pain

## 2023-07-30 LAB — BASIC METABOLIC PANEL WITH GFR
BUN/Creatinine Ratio: 12 (calc) (ref 6–22)
BUN: 15 mg/dL (ref 7–25)
CO2: 28 mmol/L (ref 20–32)
Calcium: 9.9 mg/dL (ref 8.6–10.4)
Chloride: 104 mmol/L (ref 98–110)
Creat: 1.24 mg/dL — ABNORMAL HIGH (ref 0.60–1.00)
Glucose, Bld: 88 mg/dL (ref 65–99)
Potassium: 4.4 mmol/L (ref 3.5–5.3)
Sodium: 140 mmol/L (ref 135–146)
eGFR: 46 mL/min/{1.73_m2} — ABNORMAL LOW (ref >=60)

## 2023-08-01 ENCOUNTER — Telehealth: Payer: Self-pay

## 2023-08-01 DIAGNOSIS — R079 Chest pain, unspecified: Secondary | ICD-10-CM | POA: Diagnosis not present

## 2023-08-01 NOTE — Telephone Encounter (Signed)
Copied from CRM [id number]. Topic: General - Other >> Aug 01, 2023  2:05 PM Geneva B wrote: Reason for CRM:  patient is calling and said that she was told to call pickard nurse if she did not get a call from the person that she was referred to please call patient back at [phone]

## 2023-08-04 DIAGNOSIS — I2510 Atherosclerotic heart disease of native coronary artery without angina pectoris: Secondary | ICD-10-CM | POA: Diagnosis not present

## 2023-08-04 DIAGNOSIS — R079 Chest pain, unspecified: Secondary | ICD-10-CM | POA: Diagnosis not present

## 2023-08-04 DIAGNOSIS — R072 Precordial pain: Secondary | ICD-10-CM | POA: Diagnosis not present

## 2023-08-04 DIAGNOSIS — J9811 Atelectasis: Secondary | ICD-10-CM | POA: Diagnosis not present

## 2023-08-06 ENCOUNTER — Ambulatory Visit (HOSPITAL_COMMUNITY)
Admission: RE | Admit: 2023-08-06 | Discharge: 2023-08-06 | Disposition: A | Discharge status: Home / Self Care | Payer: 59 | Source: Ambulatory Visit | Attending: Family Medicine | Admitting: Family Medicine

## 2023-08-06 ENCOUNTER — Encounter (HOSPITAL_COMMUNITY): Payer: Self-pay

## 2023-08-06 DIAGNOSIS — Z1231 Encounter for screening mammogram for malignant neoplasm of breast: Secondary | ICD-10-CM | POA: Insufficient documentation

## 2023-08-11 ENCOUNTER — Telehealth: Payer: Self-pay | Admitting: Internal Medicine

## 2023-08-11 NOTE — Telephone Encounter (Signed)
 Patient left a message saying that her dr referred her here and she hasn't heard from the office.  I called her back and had to leave a message letting her know that the referral was sent to [facility] and if she wanted to come to our office she would need to talk to Dr. Monty App office and get the referral changed to our office.

## 2023-08-13 ENCOUNTER — Ambulatory Visit (INDEPENDENT_AMBULATORY_CARE_PROVIDER_SITE_OTHER): Payer: 59 | Admitting: Gastroenterology

## 2023-08-13 ENCOUNTER — Encounter: Payer: Self-pay | Admitting: Gastroenterology

## 2023-08-13 VITALS — BP 128/84 | HR 75 | Ht 61.0 in | Wt 195.1 lb

## 2023-08-13 DIAGNOSIS — K625 Hemorrhage of anus and rectum: Secondary | ICD-10-CM | POA: Diagnosis not present

## 2023-08-13 DIAGNOSIS — K5909 Other constipation: Secondary | ICD-10-CM

## 2023-08-13 DIAGNOSIS — R198 Other specified symptoms and signs involving the digestive system and abdomen: Secondary | ICD-10-CM | POA: Diagnosis not present

## 2023-08-13 DIAGNOSIS — K6289 Other specified diseases of anus and rectum: Secondary | ICD-10-CM

## 2023-08-13 DIAGNOSIS — G8929 Other chronic pain: Secondary | ICD-10-CM

## 2023-08-13 DIAGNOSIS — M6289 Other specified disorders of muscle: Secondary | ICD-10-CM | POA: Diagnosis not present

## 2023-08-13 DIAGNOSIS — R948 Abnormal results of function studies of other organs and systems: Secondary | ICD-10-CM | POA: Diagnosis not present

## 2023-08-13 DIAGNOSIS — C679 Malignant neoplasm of bladder, unspecified: Secondary | ICD-10-CM

## 2023-08-13 DIAGNOSIS — I824Y9 Acute embolism and thrombosis of unspecified deep veins of unspecified proximal lower extremity: Secondary | ICD-10-CM | POA: Diagnosis not present

## 2023-08-13 MED ORDER — SUFLAVE 178.7 G PO SOLR: 1.0000 | Freq: Once | ORAL | 0 refills | Status: AC

## 2023-08-13 MED ORDER — NON FORMULARY: 1 refills | Status: DC

## 2023-08-13 MED ORDER — HYDROCORTISONE (PERIANAL) 2.5 % EX CREA: 1.0000 | TOPICAL_CREAM | Freq: Two times a day (BID) | CUTANEOUS | 0 refills | Status: AC

## 2023-08-13 NOTE — Patient Instructions (Addendum)
You have been scheduled for a colonoscopy. Please follow written instructions given to you at your visit today.   If you use inhalers (even only as needed), please bring them with you on the day of your procedure. _____________________________________________  Bonita Quin will receive your bowel preparation through [facility], which ensures the lowest copay and home delivery, with outreach via text or call from an [phone] number. Please respond promptly to avoid rescheduling of your procedure. If you are interested in alternative options or have any questions regarding your prep, please contact them at [phone] _____________________________________________  Your Provider Has Sent Your Bowel Prep Regimen To [facility]   [facility] will contact you to verify your information and collect your copay, if applicable. Enjoy the comfort of your home while your prescription is mailed to you, FREE of any shipping charges.   [facility] accepts all major insurance benefits and applies discounts & coupons.  Have additional questions?   Chat: [email or web address] Call: [phone] Email: [email or web address] NCPDP: [id number]  How will [facility] contact you?  With a Welcome phone call,  a Welcome text and a checkout link in text form.  Texts you receive from [phone] Are NOT Spam.  *To set up delivery, you must complete the checkout process via link or speak to one of the patient care representatives. If [facility] is unable to reach you, your prescription may be delayed.  To avoid long hold times on the phone, you may also utilize the secure chat feature on the [facility] website to request that they call you back for transaction completion or to expedite your concerns.  Your provider has prescribed Diltiazem gel for you. Please follow the directions written on your prescription bottle or given to you specifically by your provider. Since this is a specialty medication and is not readily  available at most local pharmacies, we have sent your prescription to:  [facility] information is below: Address: [address], [place], [place] [address]  [phone]  *Please DO NOT go directly from our office to pick up this medication! Give the pharmacy 1 day to process the prescription as this is compounded and takes time to make.  Due to recent changes in healthcare laws, you may see the results of your imaging and laboratory studies on MyChart before your provider has had a chance to review them.  We understand that in some cases there may be results that are confusing or concerning to you. Not all laboratory results come back in the same time frame and the provider may be waiting for multiple results in order to interpret others.  Please give [place] 48 hours in order for your provider to thoroughly review all the results before contacting the office for clarification of your results.   Thank you for trusting me with your gastrointestinal care!   Boone Master, PA

## 2023-08-13 NOTE — Progress Notes (Addendum)
Chief Complaint: Abnormal PET scan Primary GI MD: Gentry Fitz  HPI: 73 year old female history of DVT/PE 2015 (on Eliquis), urothelial carcinoma s/p radiation chemo followed with [facility], s/p cystectomy with ileal conduit 2009 complicated by bowel injury, rectal ulcer, SBO s/p small bowel resection, pelvic floor dysfunction, presents for evaluation of abnormal PET scan.  Recent CBC and CMP unrevealing other than CKD.  Past procedures Exploratory laparotomy with lysis of adhesions 05/2014 Resection small bowel with enterostomy 05/2014 Ileoconduit 05/2017 Colonoscopy with dilation 05/2014 Colonoscopy with biopsy 09/2014 Transanal rectal biopsy 01/2015 Destruction of anal lesions 01/2015 (Dr. Rosalyn Charters)  Interval GI history Previous patient of [facility]  (Dr. Camille Bal). She was previously followed at [facility] last seen March 2023.  She reportedly had abnormal results on anorectal manometry in 2021 and was diagnosed with pelvic floor dysfunction and was enrolled in pelvic floor physical therapy for ongoing rectal discomfort with bowel movements and rectal bleeding which improved with pelvic floor physical therapy.  Appears [facility] recommended repeat colonoscopy but this was never pursued.  Last endoscopic procedure was a flexible sigmoidoscopy in 2017 that was normal.  She did have a colonoscopy 2016 with poor prep, rectal ulcer, normal biopsies.  --------------------TODAY--------------------------  Patient presents today referred here by PCP for abnormal PET scan. PET scan 02/04/2023: Focal hypermetabolic activity within the transverse distal colon with possible associated soft tissue nodule.  Could reflect polyp/colon cancer.  Stable postsurgical changes from previous cystectomy and urinary diversion.  No evidence of metastatic bladder cancer  Patient states she has had chronic rectal pain and rectal bleeding intermittently for the past 8 years.  As above she has been seen at [facility] for this and diagnosed with pelvic floor dysfunction and previously followed with pelvic floor physical therapy after undergoing anorectal manometry.  She states she continues to have rectal pain with every bowel movement and has to take an oxycodone with every bowel movement.  She alternates between diarrhea and regular stools and will sometimes have fecal incontinence.  She notes rectal bleeding occurring 4 times per week mainly in the toilet.  Sometimes her pain feels like a stabbing pain or a burning pain.  Denies unintentional weight loss.  Denies family history of colon cancer.  Denies upper GI symptoms.  PREVIOUS GI WORKUP   Colonoscopy 05/2014 - Diverticulosis in sigmoid and descending colon - Stricture and terminal ileum.  Dilated with a balloon to 11 mm - Dilated in the distal ileum - No specimens collected  Colonoscopy in 2016 with poor prep.  Noted rectal ulcer with random colon biopsies that were normal  Flex sig in 2017: Normal  Echocardiogram January 2025 with a EF greater than 55%   Past Medical History:  Diagnosis Date   Acid reflux    Bowel obstruction (HCC) several   Recurrent SBO secondary to adhesions   Cancer [facility]) 2011 [facility])   diagnosed in 2009 per pt. Invasive High grade Urothelial carcinoma- s/p radiation and Chemo    Chronic back pain    DVT (deep venous thrombosis) (HCC) 2015   Fatty liver    Gout    Hyperlipidemia    Hypertension    pt says taken off medication since has lost weight.   Hypothyroidism    Internal hemorrhoids    Leukopenia 12/06/2011   HIV serology negative   Malnutrition (HCC)    PE (pulmonary embolism)    PER [facility] records   Rectal ulcer 2016   [facility]   UTI (lower  urinary tract infection)    Recurrent    Past Surgical History:  Procedure Laterality Date   ABDOMINAL SURGERY     with intestinal "puncture" x 2, exploratory surgery    BACK SURGERY     X2   BLADDER REMOVAL     CHOLECYSTECTOMY      CYSTECTOMY     breast   HERNIA REPAIR     mesh   ILEO CONDUIT     For bladder cancer   PORTACATH PLACEMENT      Current Outpatient Medications  Medication Sig Dispense Refill   apixaban (ELIQUIS) 5 MG TABS tablet TAKE 1 TABLET BY MOUTH TWICE  DAILY 200 tablet 1   Ascorbic Acid (VITAMIN C PO) Take 1 tablet by mouth daily.     Casanthranol-Docusate Sodium (STOOL SOFTENER PLUS PO) Take by mouth.     colestipol (COLESTID) 1 g tablet Take by mouth.     Cyanocobalamin 1000 MCG CAPS Take 1 tablet by mouth daily. 90 capsule 3   dexlansoprazole (DEXILANT) 60 MG capsule TAKE 1 CAPSULE BY MOUTH DAILY 100 capsule 2   diclofenac Sodium (VOLTAREN) 1 % GEL APPLY TO AFFECTED AREAS 3 TIMES DAILY AS NEEDED. 100 g 0   Ergocalciferol (VITAMIN D2) 50 MCG (2000 UT) TABS tAKE 1 Tablet daily 30 tablet    fluticasone (FLONASE) 50 MCG/ACT nasal spray Place 2 sprays into both nostrils daily. 16 g 6   furosemide (LASIX) 40 MG tablet TAKE 1 TABLET BY MOUTH TWICE  DAILY AS NEEDED FOR EDEMA 200 tablet 2   hydrocortisone (ANUSOL-HC) 2.5 % rectal cream Place 1 Application rectally 2 (two) times daily for 14 days. 42 g 0   levothyroxine (SYNTHROID) 125 MCG tablet Take 1 tablet (125 mcg total) by mouth daily. 90 tablet 3   lidocaine (XYLOCAINE) 5 % ointment APPLY TOPICALLY 4 TIMES  DAILY AS NEEDED 354.4 g 0   linaclotide (LINZESS) 145 MCG CAPS capsule TAKE 1 CAPSULE DAILY BEFORE BREAKFAST. 90 capsule 0   losartan (COZAAR) 50 MG tablet Take 1 tablet (50 mg total) by mouth daily. 90 tablet 3   magnesium oxide (MAG-OX) 400 MG tablet Take by mouth.     Magnesium Oxide 400 (240 Mg) MG TABS Take 1.5 tablet BID 90 tablet 2   NON FORMULARY Diltiazem 2%/Lidocaine5% compound Use 3 x rectally daily for 6 weeks to heal anal fissure 30 g 1   ondansetron (ZOFRAN-ODT) 8 MG disintegrating tablet DISSOLVE 1 TABLET ON THE  TONGUE EVERY 8 HOURS AS  NEEDED FOR NAUSEA 90 tablet 3   oxyCODONE (OXY IR/ROXICODONE) 5 MG immediate release tablet  Take 1 tablet (5 mg total) by mouth every 6 (six) hours as needed for severe pain (pain score 7-10). 120 tablet 0   potassium chloride SA (KLOR-CON M) 20 MEQ tablet TAKE 1 TABLET BY MOUTH DAILY 100 tablet 1   pravastatin (PRAVACHOL) 20 MG tablet Take 1 tablet (20 mg total) by mouth daily. 100 tablet 1   SUFLAVE 178.7 g SOLR Take 1 kit by mouth once for 1 dose. 1 each 0   traZODone (DESYREL) 50 MG tablet Take 1 tablet (50 mg total) by mouth at bedtime. 30 tablet 1   No current facility-administered medications for this visit.    Allergies as of 08/13/2023 - Review Complete 08/13/2023  Allergen Reaction Noted   Bactrim [sulfamethoxazole-trimethoprim]  08/25/2012   Codeine Hives 03/28/2011   Lactose Diarrhea 04/04/2014   Nitrofuran derivatives Itching 03/29/2011   Sulfa antibiotics Hives 03/28/2011  Penicillins Rash 12/05/2011    Family History  Problem Relation Age of Onset   Heart disease Mother        enlarged   Hypertension Mother    Stroke Mother    Diabetes Father    Diabetes Sister    Diabetes Maternal Grandmother    Colon cancer Neg Hx     Social History   Socioeconomic History   Marital status: Widowed    Spouse name: Not on file   Number of children: 2   Years of education: Not on file   Highest education level: Not on file  Occupational History    Employer: UNEMPLOYED  Tobacco Use   Smoking status: Former    Current packs/day: 0.00    Average packs/day: 0.6 packs/day for 48.0 years (28.8 ttl pk-yrs)    Types: Cigarettes    Start date: 10/03/1968    Quit date: 10/03/2016    Years since quitting: 6.8   Smokeless tobacco: Never  Vaping Use   Vaping status: Never Used  Substance and Sexual Activity   Alcohol use: No   Drug use: No   Sexual activity: Yes    Birth control/protection: Post-menopausal  Other Topics Concern   Not on file  Social History Narrative   Not on file   Social Drivers of Health   Financial Resource Strain: Medium Risk (08/09/2022)    Overall Financial Resource Strain (CARDIA)    Difficulty of Paying Living Expenses: Somewhat hard  Food Insecurity: No Food Insecurity (08/09/2022)   Hunger Vital Sign    Worried About Running Out of Food in the Last Year: Never true    Ran Out of Food in the Last Year: Never true  Transportation Needs: Unmet Transportation Needs (08/09/2022)   PRAPARE - Transportation    Lack of Transportation (Medical): Yes    Lack of Transportation (Non-Medical): Yes  Physical Activity: Insufficiently Active (08/09/2022)   Exercise Vital Sign    Days of Exercise per Week: 3 days    Minutes of Exercise per Session: 20 min  Stress: No Stress Concern Present (08/09/2022)   [employer] of Occupational Health - Occupational Stress Questionnaire    Feeling of Stress : Not at all  Social Connections: Unknown (08/09/2022)   Social Connection and Isolation Panel [NHANES]    Frequency of Communication with Friends and Family: Once a week    Frequency of Social Gatherings with Friends and Family: Once a week    Attends Religious Services: Not on Insurance claims handler of Clubs or Organizations: Yes    Attends Banker Meetings: 1 to 4 times per year    Marital Status: Widowed  Intimate Partner Violence: Not At Risk (08/09/2022)   Humiliation, Afraid, Rape, and Kick questionnaire    Fear of Current or Ex-Partner: No    Emotionally Abused: No    Physically Abused: No    Sexually Abused: No    Review of Systems:    Constitutional: No weight loss, fever, chills, weakness or fatigue HEENT: Eyes: No change in vision               Ears, Nose, Throat:  No change in hearing or congestion Skin: No rash or itching Cardiovascular: No chest pain, chest pressure or palpitations   Respiratory: No SOB or cough Gastrointestinal: See HPI and otherwise negative Genitourinary: No dysuria or change in urinary frequency Neurological: No headache, dizziness or syncope Musculoskeletal: No new muscle or joint  pain Hematologic: No  bleeding or bruising Psychiatric: No history of depression or anxiety    Physical Exam:  Vital signs: BP 128/84   Pulse 75   Ht 5\' 1"  (1.549 m)   Wt 195 lb 2 oz (88.5 kg)   BMI 36.87 kg/m   Constitutional: NAD, Well developed, Well nourished, alert and cooperative.  Appears in better health than chart depicts.  Appears younger than stated age Head:  Normocephalic and atraumatic. Eyes:   PEERL, EOMI. No icterus. Conjunctiva pink. Respiratory: Respirations even and unlabored. Lungs clear to auscultation bilaterally.   No wheezes, crackles, or rhonchi.  Cardiovascular:  Regular rate and rhythm. No peripheral edema, cyanosis or pallor.  Gastrointestinal: Soft, nondistended, nontender.  Midline surgical scar the length of the abdomen.  Ileal conduit noted. Rectal:  Not performed.  Patient declined, deferred to colonoscopy Msk:  Symmetrical without gross deformities. Without edema, no deformity or joint abnormality.  Neurologic:  Alert and  oriented x4;  grossly normal neurologically.  Skin:   Dry and intact without significant lesions or rashes. Psychiatric: Oriented to person, place and time. Demonstrates good judgement and reason without abnormal affect or behaviors.   RELEVANT LABS AND IMAGING: CBC    Component Value Date/Time   WBC 10.1 07/01/2023 1217   RBC 5.31 (H) 07/01/2023 1217   HGB 13.4 07/01/2023 1217   HCT 42.5 07/01/2023 1217   PLT 361 07/01/2023 1217   MCV 80.0 07/01/2023 1217   MCH 25.2 (L) 07/01/2023 1217   MCHC 31.5 (L) 07/01/2023 1217   RDW 15.6 (H) 07/01/2023 1217   LYMPHSABS 2,007 06/06/2022 1557   MONOABS 0.5 12/16/2016 1703   EOSABS 172 07/01/2023 1217   BASOSABS 30 07/01/2023 1217    CMP     Component Value Date/Time   NA 140 07/29/2023 1140   NA 141 03/30/2020 1050   K 4.4 07/29/2023 1140   CL 104 07/29/2023 1140   CO2 28 07/29/2023 1140   GLUCOSE 88 07/29/2023 1140   BUN 15 07/29/2023 1140   BUN 16 03/30/2020 1050    CREATININE 1.24 (H) 07/29/2023 1140   CALCIUM 9.9 07/29/2023 1140   PROT 7.4 07/01/2023 1217   ALBUMIN 3.8 09/11/2019 1335   AST 10 07/01/2023 1217   ALT 10 07/01/2023 1217   ALKPHOS 87 09/11/2019 1335   BILITOT 0.6 07/01/2023 1217   GFRNONAA 44 (L) 11/30/2020 0905   GFRAA 51 (L) 11/30/2020 0905     Assessment/Plan:      Abnormal PET scan PET scan in August showed focal hypermetabolic activity in distal transverse colon.  Patient has previous history of small bowel obstruction s/p resection.  Last colonoscopy in 2016 poor prep but did show rectal ulcer with random biopsies unrevealing.  Normal flexible sigmoidoscopy 2017.  [facility] recommended repeating colonoscopy but appears this was never pursued. - Schedule colonoscopy - I thoroughly discussed the procedure with the patient (at bedside) to include nature of the procedure, alternatives, benefits, and risks (including but not limited to bleeding, infection, perforation, anesthesia/cardiac pulmonary complications).  Patient verbalized understanding and gave verbal consent to proceed with procedure.  Pelvic floor dysfunction Rectal pain Rectal bleeding History of pelvic floor dysfunction diagnosed at [facility] with abnormal anorectal manometry previously improved with pelvic floor physical therapy.  Chronic rectal pain with bowel movements and rectal bleeding.  Patient declined rectal exam today and would prefer to defer colonoscopy.  DDx includes rectal ulcer, anal fissure, hemorrhoids, malignancy - Schedule colonoscopy for further evaluation - Can send in lidocaine/diltiazem compound  cream in case her pain is a fissure.  If too expensive send in trial of perianal hydrocortisone cream twice daily for 14 days - Management per colonoscopy findings - May need to be referred back to pelvic floor physical therapy post colonoscopy  Chronic constipation History of stricture of terminal ileum s/p dilation in 2015.  Constipation likely secondary to  chronic oxycodone use and pelvic floor dysfunction.  Currently well-controlled on Linzess - Continue Linzess 145 mcg  Long-term use anticoagulation secondary to DVT/PE - Will hold Eliquis 2 days prior to endoscopic procedures - will instruct when and how to resume after procedure. Benefits and risks of procedure explained including risks of bleeding, perforation, infection, missed lesions, reactions to medications and possible need for hospitalization and surgery for complications. Additional rare but real risk of stroke or other vascular clotting events off Eliquis also explained and need to seek urgent help if any signs of these problems occur. Will communicate by phone or EMR with patient's  prescribing provider to confirm that holding Eliquis is reasonable in this case.    Bladder cancer S/p ileal conduit. Recent PET with no evidence of metastasis  With her extensive previous past medical history involving procedures/sedation, we will send this case to Cathlyn Parsons, CRNA to ensure she is an [facility] candidate.  Recent echocardiogram with ejection fraction greater than 55%.  Assigned to Dr. Tomasa Rand today           Boone Master, PA-C [facility] 08/13/2023, 11:56 AM  Cc: Donita Brooks, MD

## 2023-08-15 DIAGNOSIS — Z936 Other artificial openings of urinary tract status: Secondary | ICD-10-CM | POA: Diagnosis not present

## 2023-08-22 DIAGNOSIS — Z20822 Contact with and (suspected) exposure to covid-19: Secondary | ICD-10-CM | POA: Diagnosis not present

## 2023-08-22 DIAGNOSIS — E785 Hyperlipidemia, unspecified: Secondary | ICD-10-CM | POA: Diagnosis not present

## 2023-08-22 DIAGNOSIS — Z882 Allergy status to sulfonamides status: Secondary | ICD-10-CM | POA: Diagnosis not present

## 2023-08-22 DIAGNOSIS — F17210 Nicotine dependence, cigarettes, uncomplicated: Secondary | ICD-10-CM | POA: Diagnosis not present

## 2023-08-22 DIAGNOSIS — Z885 Allergy status to narcotic agent status: Secondary | ICD-10-CM | POA: Diagnosis not present

## 2023-08-22 DIAGNOSIS — Z79899 Other long term (current) drug therapy: Secondary | ICD-10-CM | POA: Diagnosis not present

## 2023-08-22 DIAGNOSIS — Z880 Allergy status to penicillin: Secondary | ICD-10-CM | POA: Diagnosis not present

## 2023-08-22 DIAGNOSIS — E039 Hypothyroidism, unspecified: Secondary | ICD-10-CM | POA: Diagnosis not present

## 2023-08-22 DIAGNOSIS — Z881 Allergy status to other antibiotic agents status: Secondary | ICD-10-CM | POA: Diagnosis not present

## 2023-08-22 DIAGNOSIS — R059 Cough, unspecified: Secondary | ICD-10-CM | POA: Diagnosis not present

## 2023-08-22 DIAGNOSIS — Z87891 Personal history of nicotine dependence: Secondary | ICD-10-CM | POA: Diagnosis not present

## 2023-08-22 DIAGNOSIS — R053 Chronic cough: Secondary | ICD-10-CM | POA: Diagnosis not present

## 2023-08-22 DIAGNOSIS — I10 Essential (primary) hypertension: Secondary | ICD-10-CM | POA: Diagnosis not present

## 2023-08-22 DIAGNOSIS — R918 Other nonspecific abnormal finding of lung field: Secondary | ICD-10-CM | POA: Diagnosis not present

## 2023-08-22 DIAGNOSIS — K219 Gastro-esophageal reflux disease without esophagitis: Secondary | ICD-10-CM | POA: Diagnosis not present

## 2023-08-22 NOTE — Progress Notes (Signed)
Agree with the assessment and plan as outlined by Boone Master, PA-C.  Complex medical history with combination of significant medical/surgical history with nonspecific endoscopic abnormalities (ileal stenosis, rectal ulceration) of unclear etiology, with atypical dyschezia associated with both constipation and diarrhea and incontinence.  Pelvic floor dysfunction previously identified and may be the primary underlying etiology for her dyschezia.  Agree with colonoscopy to follow up on abnormal PET and see if previous ulcers/ileal stenosis are persistent.  Unclear why patient elected to transfer care from [facility].

## 2023-08-29 ENCOUNTER — Encounter: Payer: Self-pay | Admitting: Family Medicine

## 2023-08-29 ENCOUNTER — Ambulatory Visit (INDEPENDENT_AMBULATORY_CARE_PROVIDER_SITE_OTHER): Payer: 59 | Admitting: Family Medicine

## 2023-08-29 VITALS — BP 124/82 | HR 97 | Temp 97.7°F | Ht 61.0 in | Wt 188.0 lb

## 2023-08-29 DIAGNOSIS — R197 Diarrhea, unspecified: Secondary | ICD-10-CM | POA: Diagnosis not present

## 2023-08-29 DIAGNOSIS — J101 Influenza due to other identified influenza virus with other respiratory manifestations: Secondary | ICD-10-CM

## 2023-08-29 MED ORDER — OXYCODONE HCL 5 MG PO TABS: 5.0000 mg | ORAL_TABLET | Freq: Four times a day (QID) | ORAL | 0 refills | Status: DC | PRN

## 2023-08-29 NOTE — Progress Notes (Signed)
 Subjective:    Patient ID: [name], female    DOB: 1950-07-06, 73 y.o.   MRN: [id number]  Diarrhea   10 days ago, the patient developed cough, head congestion, fever.  She went to the emergency room where she tested positive for type a influenza.  She states that the cough has improved dramatically and is now resolved.  She is no longer having any fever.  She denies any chest pain or shortness of breath.  She denies any nausea or vomiting however she has had diarrhea for the last few days. Past Medical History:  Diagnosis Date   Acid reflux    Bowel obstruction (HCC) several   Recurrent SBO secondary to adhesions   Cancer [facility]) 2011 [facility])   diagnosed in 2009 per pt. Invasive High grade Urothelial carcinoma- s/p radiation and Chemo    Chronic back pain    DVT (deep venous thrombosis) (HCC) 2015   Fatty liver    Gout    Hyperlipidemia    Hypertension    pt says taken off medication since has lost weight.   Hypothyroidism    Internal hemorrhoids    Leukopenia 12/06/2011   HIV serology negative   Malnutrition (HCC)    PE (pulmonary embolism)    PER [facility] records   Rectal ulcer 2016   [facility]   UTI (lower urinary tract infection)    Recurrent   Past Surgical History:  Procedure Laterality Date   ABDOMINAL SURGERY     with intestinal "puncture" x 2, exploratory surgery    BACK SURGERY     X2   BLADDER REMOVAL     CHOLECYSTECTOMY     CYSTECTOMY     breast   HERNIA REPAIR     mesh   ILEO CONDUIT     For bladder cancer   PORTACATH PLACEMENT     Current Outpatient Medications on File Prior to Visit  Medication Sig Dispense Refill   apixaban (ELIQUIS) 5 MG TABS tablet TAKE 1 TABLET BY MOUTH TWICE  DAILY 200 tablet 0   Ascorbic Acid (VITAMIN C PO) Take 1 tablet by mouth daily.     benzonatate (TESSALON) 100 MG capsule Take 100 mg by mouth 3 (three) times daily as needed for cough.     Casanthranol-Docusate Sodium (STOOL SOFTENER PLUS PO) Take by  mouth.     colestipol (COLESTID) 1 g tablet Take by mouth.     Cyanocobalamin 1000 MCG CAPS Take 1 tablet by mouth daily. 90 capsule 3   dexlansoprazole (DEXILANT) 60 MG capsule TAKE 1 CAPSULE BY MOUTH DAILY 100 capsule 2   diclofenac Sodium (VOLTAREN) 1 % GEL APPLY TO AFFECTED AREAS 3 TIMES DAILY AS NEEDED. 100 g 0   Ergocalciferol (VITAMIN D2) 50 MCG (2000 UT) TABS tAKE 1 Tablet daily 30 tablet    fluticasone (FLONASE) 50 MCG/ACT nasal spray Place 2 sprays into both nostrils daily. 16 g 6   furosemide (LASIX) 40 MG tablet TAKE 1 TABLET BY MOUTH TWICE  DAILY AS NEEDED FOR EDEMA 200 tablet 0   levothyroxine (SYNTHROID) 125 MCG tablet Take 1 tablet (125 mcg total) by mouth daily. 90 tablet 3   lidocaine (XYLOCAINE) 5 % ointment APPLY TOPICALLY 4 TIMES  DAILY AS NEEDED 354.4 g 0   linaclotide (LINZESS) 145 MCG CAPS capsule TAKE 1 CAPSULE DAILY BEFORE BREAKFAST. 90 capsule 0   losartan (COZAAR) 50 MG tablet Take 1 tablet (50 mg total) by mouth daily. 90 tablet 3  magnesium oxide (MAG-OX) 400 MG tablet Take by mouth.     Magnesium Oxide 400 (240 Mg) MG TABS Take 1.5 tablet BID 90 tablet 2   NON FORMULARY Diltiazem 2%/Lidocaine5% compound Use 3 x rectally daily for 6 weeks to heal anal fissure 30 g 1   ondansetron (ZOFRAN-ODT) 8 MG disintegrating tablet DISSOLVE 1 TABLET ON THE  TONGUE EVERY 8 HOURS AS  NEEDED FOR NAUSEA 90 tablet 3   potassium chloride SA (KLOR-CON M) 20 MEQ tablet TAKE 1 TABLET BY MOUTH DAILY 100 tablet 0   pravastatin (PRAVACHOL) 20 MG tablet Take 1 tablet (20 mg total) by mouth daily. 100 tablet 1   traZODone (DESYREL) 50 MG tablet Take 1 tablet (50 mg total) by mouth at bedtime. 30 tablet 1   No current facility-administered medications on file prior to visit.   Allergies  Allergen Reactions   Bactrim [Sulfamethoxazole-Trimethoprim]    Codeine Hives   Lactose Diarrhea   Nitrofuran Derivatives Itching   Sulfa Antibiotics Hives   Penicillins Rash    Has patient had a PCN  reaction causing immediate rash, facial/tongue/throat swelling, SOB or lightheadedness with hypotension: No Has patient had a PCN reaction causing severe rash involving mucus membranes or skin necrosis: No Has patient had a PCN reaction that required hospitalization: No Has patient had a PCN reaction occurring within the last 10 years: No If all of the above answers are "NO", then may proceed with Cephalosporin use.  Childhood rash - no adult reactions known   Social History   Socioeconomic History   Marital status: Widowed    Spouse name: Not on file   Number of children: 2   Years of education: Not on file   Highest education level: Not on file  Occupational History    Employer: UNEMPLOYED  Tobacco Use   Smoking status: Former    Current packs/day: 0.00    Average packs/day: 0.6 packs/day for 48.0 years (28.8 ttl pk-yrs)    Types: Cigarettes    Start date: 10/03/1968    Quit date: 10/03/2016    Years since quitting: 6.9   Smokeless tobacco: Never  Vaping Use   Vaping status: Never Used  Substance and Sexual Activity   Alcohol use: No   Drug use: No   Sexual activity: Yes    Birth control/protection: Post-menopausal  Other Topics Concern   Not on file  Social History Narrative   Not on file   Social Drivers of Health   Financial Resource Strain: Medium Risk (08/09/2022)   Overall Financial Resource Strain (CARDIA)    Difficulty of Paying Living Expenses: Somewhat hard  Food Insecurity: No Food Insecurity (08/09/2022)   Hunger Vital Sign    Worried About Running Out of Food in the Last Year: Never true    Ran Out of Food in the Last Year: Never true  Transportation Needs: Unmet Transportation Needs (08/09/2022)   PRAPARE - Transportation    Lack of Transportation (Medical): Yes    Lack of Transportation (Non-Medical): Yes  Physical Activity: Insufficiently Active (08/09/2022)   Exercise Vital Sign    Days of Exercise per Week: 3 days    Minutes of Exercise per Session: 20  min  Stress: No Stress Concern Present (08/09/2022)   [employer] of Occupational Health - Occupational Stress Questionnaire    Feeling of Stress : Not at all  Social Connections: Unknown (08/09/2022)   Social Connection and Isolation Panel [NHANES]    Frequency of Communication with Friends and  Family: Once a week    Frequency of Social Gatherings with Friends and Family: Once a week    Attends Religious Services: Not on Marketing executive or Organizations: Yes    Attends Banker Meetings: 1 to 4 times per year    Marital Status: Widowed  Intimate Partner Violence: Not At Risk (08/09/2022)   Humiliation, Afraid, Rape, and Kick questionnaire    Fear of Current or Ex-Partner: No    Emotionally Abused: No    Physically Abused: No    Sexually Abused: No       Review of Systems  Gastrointestinal:  Positive for diarrhea.  All other systems reviewed and are negative.      Objective:   Physical Exam Vitals reviewed.  Eyes:     General: No scleral icterus.       Right eye: No discharge.        Left eye: No discharge.     Conjunctiva/sclera: Conjunctivae normal.     Pupils: Pupils are equal, round, and reactive to light.  Cardiovascular:     Rate and Rhythm: Normal rate and regular rhythm.     Heart sounds: Normal heart sounds. No murmur heard. Pulmonary:     Effort: Pulmonary effort is normal. No respiratory distress.     Breath sounds: Normal breath sounds. No wheezing or rales.  Chest:     Chest wall: No tenderness.  Abdominal:     General: Bowel sounds are normal. There is no distension.     Palpations: Abdomen is soft. There is no mass.     Tenderness: There is no abdominal tenderness. There is no guarding or rebound.  Skin:    General: Skin is warm.     Findings: No erythema or rash.  Neurological:     Motor: No abnormal muscle tone.         Assessment & Plan:  Influenza A Patient is recovering from influenza A.  I recommended that  the patient drink plenty of fluids.  I recommended that she use Imodium as needed to help with the diarrhea.  If the diarrhea is not improving with Imodium, consider adding probiotic.  Patient denies any abdominal pain or has no evidence of an acute abdomen.  Anticipate gradual improvement over the next 3 to 4 days.

## 2023-09-03 ENCOUNTER — Other Ambulatory Visit: Payer: Self-pay | Admitting: Family Medicine

## 2023-09-09 DIAGNOSIS — R079 Chest pain, unspecified: Secondary | ICD-10-CM | POA: Diagnosis not present

## 2023-09-16 ENCOUNTER — Telehealth: Payer: Self-pay | Admitting: Gastroenterology

## 2023-09-16 MED ORDER — NON FORMULARY: 1 refills | Status: AC

## 2023-09-16 NOTE — Telephone Encounter (Signed)
 Contacted patient and patient was advised that her instructions will be sent by mail and patient was also advised that I would have to get approval to refill her ointment. Please advise.

## 2023-09-16 NOTE — Telephone Encounter (Signed)
 Patient called stated she is schedule for 09/24/23 but has no prep instructions and can not get into MyChart. She is also requesting a refill on an ointment for rectal pain.

## 2023-09-22 ENCOUNTER — Telehealth: Payer: Self-pay

## 2023-09-22 ENCOUNTER — Other Ambulatory Visit: Payer: Self-pay | Admitting: Family Medicine

## 2023-09-22 ENCOUNTER — Telehealth: Payer: Self-pay | Admitting: Gastroenterology

## 2023-09-22 NOTE — Telephone Encounter (Signed)
 Requested medication (s) are due for refill today: Yes  Requested medication (s) are on the active medication list: Yes  Last refill:  08/29/23  Future visit scheduled: Yes  Notes to clinic:  Unable to refill per protocol, cannot delegate.      Requested Prescriptions  Pending Prescriptions Disp Refills   oxyCODONE (OXY IR/ROXICODONE) 5 MG immediate release tablet 120 tablet 0    Sig: Take 1 tablet (5 mg total) by mouth every 6 (six) hours as needed for severe pain (pain score 7-10).     Not Delegated - Analgesics:  Opioid Agonists Failed - 09/22/2023  5:20 PM      Failed - This refill cannot be delegated      Failed - Urine Drug Screen completed in last 360 days      Failed - Valid encounter within last 3 months    Recent Outpatient Visits           2 years ago Pelvic floor dysfunction in female   [facility] Pickard, Priscille Heidelberg, MD   2 years ago No-show for appointment   [facility] Valentino Nose, NP   2 years ago Right arm pain   [facility] Tanya Nones, Priscille Heidelberg, MD   2 years ago Lateral epicondylitis of right elbow   [facility] Donita Brooks, MD   2 years ago Hypothyroidism, unspecified type   [facility] Pickard, Priscille Heidelberg, MD

## 2023-09-22 NOTE — Telephone Encounter (Signed)
 Copied from CRM [id number]. Topic: General - Other >> Sep 22, 2023  3:31 PM Abundio Miu S wrote: Reason for CRM: DeeDee with [facility] calling in regard to an urgent Anticoagulant order for patient. Contacted CAL. Callback [phone]

## 2023-09-22 NOTE — Telephone Encounter (Signed)
 It appears patient is followed by [facility].

## 2023-09-22 NOTE — Telephone Encounter (Signed)
 Contacted patient's PCP and Nurse Germaine Pomfret gave a verbal order stating that patient can hold Eliquis 2 days prior to her procedure per PCP.

## 2023-09-22 NOTE — Telephone Encounter (Unsigned)
 Copied from CRM [id number]. Topic: Clinical - Medication Refill >> Sep 22, 2023  1:22 PM [name] wrote: Most Recent Primary Care Visit:  Provider: Lynnea Ferrier T  Department: [facility]  Visit Type: HOSPITAL FOLLOW UP  Date: 08/29/2023  Medication: oxyCODONE (OXY IR/ROXICODONE) 5 MG immediate release tablet   Has the patient contacted their pharmacy? No (Agent: If no, request that the patient contact the pharmacy for the refill. If patient does not wish to contact the pharmacy document the reason why and proceed with request.) (Agent: If yes, when and what did the pharmacy advise?)  Is this the correct pharmacy for this prescription? [name]  If no, delete pharmacy and type the correct one.  This is the patient's preferred pharmacy:  [facility] - [place], [place] - [address] [place] [address] Phone: [phone] Fax: [phone]  Has the prescription been filled recently? No  Is the patient out of the medication? No  Has the patient been seen for an appointment in the last year OR does the patient have an upcoming appointment? Yes  Can we respond through MyChart? No  Agent: Please be advised that Rx refills may take up to 3 business days. We ask that you follow-up with your pharmacy.

## 2023-09-22 NOTE — Telephone Encounter (Signed)
 Patient states that she never received any instructions regarding how to prep for her upcoming colonoscopy on 09/24/23. I advised that she should have been given this information on the day of her office visit, 08/13/23. Patient is adamant that she did not get these. I have made the instructions available on her mychart which she has requested.  In addition, patient states that she does not have instructions regarding her blood thinner but did talk to her cardiologist who says that she should hold the medicine for 3 days. I advised that we must also get that clearance from cardiology prior to proceeding with procedure.   Patient states that she is very nervous about having this procedure because she has had 2 bowel perforations previously. Wonders if she should still take the entire prep ordered. I instructed that she should take the prep in its entirety, especially with her recent history of constipation.  [name], CMA to instruct patient regarding Eliquis clearance when received, hopefully in time to hold appropriately before 09/24/23 procedure.

## 2023-09-22 NOTE — Telephone Encounter (Signed)
 [facility] Pre-operative Risk Assessment     Request for surgical clearance:     Endoscopy Procedure  What type of surgery is being performed?     Colonoscopy  When is this surgery scheduled?     09/24/23  What type of clearance is required ?   Pharmacy  Are there any medications that need to be held prior to surgery and how long? Eliquis and 2 days  Practice name and name of physician performing surgery?      [facility]  What is your office phone and fax number?      Phone- [phone]  Fax- [phone]  Anesthesia type (None, local, MAC, general) ?       MAC   Please route your response to Shakelia Scrivner, CMA

## 2023-09-22 NOTE — Telephone Encounter (Signed)
 Patient has been notified to hold Eliquis and instructions were resent via MyChart.

## 2023-09-22 NOTE — Telephone Encounter (Signed)
 Patient called and stated that she has not received her prep instruction and is requesting that we sent them to her mychart. I advise patient to look through her letter in my chart for a date of 08/13/2023, patient stated that she does not have it. Patient also stated that she has had part of her colon removed twice, and was wondering if she should drink all of her prep medication. Patient is requesting a call back. Please advise.

## 2023-09-22 NOTE — Telephone Encounter (Signed)
 Clearance has been sent to PCP. Reaching out to them now.

## 2023-09-22 NOTE — Telephone Encounter (Signed)
>>   Sep 22, 2023  3:31 PM Abundio Miu S wrote: Reason for CRM: DeeDee with [facility] calling in regard to an urgent Anticoagulant order for patient. Contacted CAL. Callback [phone]   Clearance needed to hold Eliquis x days prior to procedure with [facility]. Thanks.

## 2023-09-23 ENCOUNTER — Other Ambulatory Visit: Payer: Self-pay | Admitting: Family Medicine

## 2023-09-23 MED ORDER — OXYCODONE HCL 5 MG PO TABS: 5.0000 mg | ORAL_TABLET | Freq: Four times a day (QID) | ORAL | 0 refills | Status: DC | PRN

## 2023-09-24 ENCOUNTER — Encounter: Payer: Self-pay | Admitting: Gastroenterology

## 2023-09-24 ENCOUNTER — Ambulatory Visit: Payer: 59 | Admitting: Gastroenterology

## 2023-09-24 VITALS — BP 147/82 | HR 90 | Temp 97.3°F | Resp 2 | Ht 61.0 in | Wt 195.2 lb

## 2023-09-24 DIAGNOSIS — R933 Abnormal findings on diagnostic imaging of other parts of digestive tract: Secondary | ICD-10-CM

## 2023-09-24 DIAGNOSIS — G8929 Other chronic pain: Secondary | ICD-10-CM

## 2023-09-24 DIAGNOSIS — K624 Stenosis of anus and rectum: Secondary | ICD-10-CM | POA: Diagnosis not present

## 2023-09-24 DIAGNOSIS — L290 Pruritus ani: Secondary | ICD-10-CM | POA: Diagnosis not present

## 2023-09-24 DIAGNOSIS — K6289 Other specified diseases of anus and rectum: Secondary | ICD-10-CM

## 2023-09-24 MED ORDER — SODIUM CHLORIDE 0.9 % IV SOLN: 500.0000 mL | INTRAVENOUS | Status: DC

## 2023-09-24 NOTE — Progress Notes (Unsigned)
 Procedure was delayed due to non functioning IV.  CRNA tried twice to establish IV access.  IV attempts failed.  Pt returned to preop for further attempts.

## 2023-09-24 NOTE — Progress Notes (Unsigned)
 Vss nad trans to pacu

## 2023-09-24 NOTE — Telephone Encounter (Signed)
 Requested medication (s) are due for refill today - no  Requested medication (s) are on the active medication list -yes  Future visit scheduled -no  Last refill: 09/23/23 #120  Notes to clinic: duplicate request- non delegated Rx  Requested Prescriptions  Pending Prescriptions Disp Refills   oxyCODONE (OXY IR/ROXICODONE) 5 MG immediate release tablet [Pharmacy Med Name: OXYCODONE HCL (IR) 5 MG TABLET] 120 tablet 0    Sig: take 1 tablet (5 MILLIGRAM total) by mouth every 6 (six) hours as needed for severe pain (pain score 7-10).     Not Delegated - Analgesics:  Opioid Agonists Failed - 09/24/2023  3:47 PM      Failed - This refill cannot be delegated      Failed - Urine Drug Screen completed in last 360 days      Failed - Valid encounter within last 3 months    Recent Outpatient Visits           3 weeks ago Influenza A   [facility] Pickard, Priscille Heidelberg, MD   1 month ago Essential hypertension   [place] [facility] Tanya Nones, Priscille Heidelberg, MD   2 months ago Encounter for screening mammogram for malignant neoplasm of breast   [place] [facility] Donita Brooks, MD   6 months ago Urinary tract infection with hematuria, site unspecified   [place] [facility] [place] Meo, FNP   8 months ago Mass of left lung   [place] [facility] Pickard, Priscille Heidelberg, MD                 Requested Prescriptions  Pending Prescriptions Disp Refills   oxyCODONE (OXY IR/ROXICODONE) 5 MG immediate release tablet [Pharmacy Med Name: OXYCODONE HCL (IR) 5 MG TABLET] 120 tablet 0    Sig: take 1 tablet (5 MILLIGRAM total) by mouth every 6 (six) hours as needed for severe pain (pain score 7-10).     Not Delegated - Analgesics:  Opioid Agonists Failed - 09/24/2023  3:47 PM      Failed - This refill cannot be delegated      Failed - Urine Drug Screen completed in last 360 days      Failed -  Valid encounter within last 3 months    Recent Outpatient Visits           3 weeks ago Influenza A   [place] [facility] Donita Brooks, MD   1 month ago Essential hypertension   [facility] Tanya Nones, Priscille Heidelberg, MD   2 months ago Encounter for screening mammogram for malignant neoplasm of breast   [place] [facility] Donita Brooks, MD   6 months ago Urinary tract infection with hematuria, site unspecified   [place] [facility] [place] Meo, FNP   8 months ago Mass of left lung   [place] [facility] Pickard, Priscille Heidelberg, MD

## 2023-09-24 NOTE — Patient Instructions (Addendum)
-  await pathology results -Continue present medications Restart Eliquis ( apixaban) at prior does tomorrow - Recommend using barrier cream to allow perianal  skin irritation to heal. - Given patient's age and lack of colon cancer risk  factors, would recommend against further colon cancer screening. - Would recommend she follow up with [facility] for  further management of painful/difficult bowel movements and pelvic dyssynergia.  YOU HAD AN ENDOSCOPIC PROCEDURE TODAY AT THE [facility]:   Refer to the procedure report that was given to you for any specific questions about what was found during the examination.  If the procedure report does not answer your questions, please call your gastroenterologist to clarify.  If you requested that your care partner not be given the details of your procedure findings, then the procedure report has been included in a sealed envelope for you to review at your convenience later.  YOU SHOULD EXPECT: Some feelings of bloating in the abdomen. Passage of more gas than usual.  Walking can help get rid of the air that was put into your GI tract during the procedure and reduce the bloating. If you had a lower endoscopy (such as a colonoscopy or flexible sigmoidoscopy) you may notice spotting of blood in your stool or on the toilet paper. If you underwent a bowel prep for your procedure, you may not have a normal bowel movement for a few days.  Please Note:  You might notice some irritation and congestion in your nose or some drainage.  This is from the oxygen used during your procedure.  There is no need for concern and it should clear up in a day or so.  SYMPTOMS TO REPORT IMMEDIATELY:  Following lower endoscopy (colonoscopy or flexible sigmoidoscopy):  Excessive amounts of blood in the stool  Significant tenderness or worsening of abdominal pains  Swelling of the abdomen that is new, acute  Fever of 100F or higher  For urgent or emergent issues, a  gastroenterologist can be reached at any hour by calling [phone]. Do not use MyChart messaging for urgent concerns.    DIET:  We do recommend a small meal at first, but then you may proceed to your regular diet.  Drink plenty of fluids but you should avoid alcoholic beverages for 24 hours.  ACTIVITY:  You should plan to take it easy for the rest of today and you should NOT DRIVE or use heavy machinery until tomorrow (because of the sedation medicines used during the test).    FOLLOW UP: Our staff will call the number listed on your records the next business day following your procedure.  We will call around 7:15- 8:00 am to check on you and address any questions or concerns that you may have regarding the information given to you following your procedure. If we do not reach you, we will leave a message.     If any biopsies were taken you will be contacted by phone or by letter within the next 1-3 weeks.  Please call us at [phone] if you have not heard about the biopsies in 3 weeks.    SIGNATURES/CONFIDENTIALITY: You and/or your care partner have signed paperwork which will be entered into your electronic medical record.  These signatures attest to the fact that that the information above on your After Visit Summary has been reviewed and is understood.  Full responsibility of the confidentiality of this discharge information lies with you and/or your care-partner.

## 2023-09-24 NOTE — Progress Notes (Unsigned)
 Marland Kitchen

## 2023-09-24 NOTE — Progress Notes (Unsigned)
 [facility] History and Physical   Primary Care Physician:  Donita Brooks, MD   Reason for Procedure:   Abnormal PET scan  Plan:    Colonoscopy     HPI: [name] is a 73 y.o. female with history of DVT/PE 2015 (on Eliquis), urothelial carcinoma s/p radiation chemo followed with [facility], s/p cystectomy with ileal conduit 2009 complicated by bowel injury, rectal ulcer, SBO s/p small bowel resection, pelvic floor dysfunction, undergoing colonoscopy for further evaluation of abnormal PET scan  in Aug 2024 which showed focal hypermetabolic activity in the transverse colon. Last colonoscopy in 2016 with poor prep, rectal ulcer and normal biopsies. Flexible sigmoidoscopy in 2017 normal. She has chronic rectal pain and intermittent rectal bleeding for the past 8 years and previously diagnosed with pelvic floor dysfunction.    Past Medical History:  Diagnosis Date   Acid reflux    Bowel obstruction (HCC) several   Recurrent SBO secondary to adhesions   Cancer [facility]) 2011 [facility])   diagnosed in 2009 per pt. Invasive High grade Urothelial carcinoma- s/p radiation and Chemo    Chronic back pain    Clotting disorder (HCC)    DVT (deep venous thrombosis) (HCC) 2015   Fatty liver    Gout    Hyperlipidemia    Hypertension    pt says taken off medication since has lost weight.   Hypothyroidism    Internal hemorrhoids    Leukopenia 12/06/2011   HIV serology negative   Malnutrition (HCC)    PE (pulmonary embolism)    [facility]   Rectal ulcer 2016   [facility]   UTI (lower urinary tract infection)    Recurrent    Past Surgical History:  Procedure Laterality Date   ABDOMINAL SURGERY     with intestinal "puncture" x 2, exploratory surgery    BACK SURGERY     X2   BLADDER REMOVAL     CHOLECYSTECTOMY     CYSTECTOMY     breast   HERNIA REPAIR     mesh   ILEO CONDUIT     For bladder cancer   PORTACATH PLACEMENT      Prior to Admission  medications   Medication Sig Start Date End Date Taking? Authorizing Provider  apixaban (ELIQUIS) 5 MG TABS tablet TAKE 1 TABLET BY MOUTH TWICE  DAILY 08/22/23  Yes Donita Brooks, MD  Ascorbic Acid (VITAMIN C PO) Take 1 tablet by mouth daily.   Yes [provider]  Casanthranol-Docusate Sodium (STOOL SOFTENER PLUS PO) Take by mouth.   Yes [provider]  colestipol (COLESTID) 1 g tablet Take by mouth. 05/14/18  Yes [provider]  Cyanocobalamin 1000 MCG CAPS Take 1 tablet by mouth daily. 08/30/20  Yes [place], [name], MD  dexlansoprazole (DEXILANT) 60 MG capsule TAKE 1 CAPSULE BY MOUTH DAILY 07/28/23  Yes Donita Brooks, MD  diclofenac Sodium (VOLTAREN) 1 % GEL APPLY TO AFFECTED AREAS 3 TIMES DAILY AS NEEDED. 01/24/20  Yes [place], [name], MD  Ergocalciferol (VITAMIN D2) 50 MCG (2000 UT) TABS tAKE 1 Tablet daily 08/18/18  Yes [place], [name], MD  famotidine (PEPCID) 20 MG tablet Take 20 mg by mouth 2 (two) times daily. 07/15/23  Yes [provider]  furosemide (LASIX) 40 MG tablet TAKE 1 TABLET BY MOUTH TWICE  DAILY AS NEEDED FOR EDEMA 08/22/23  Yes Donita Brooks, MD  levothyroxine (SYNTHROID) 125 MCG tablet Take 1 tablet (125 mcg total)  by mouth daily. 07/29/22  Yes Donita Brooks, MD  lidocaine (XYLOCAINE) 5 % ointment APPLY TOPICALLY 4 TIMES  DAILY AS NEEDED 12/19/22  Yes Donita Brooks, MD  losartan (COZAAR) 50 MG tablet Take 1 tablet (50 mg total) by mouth daily. 07/01/23  Yes Donita Brooks, MD  magnesium oxide (MAG-OX) 400 MG tablet Take by mouth. 04/28/17  Yes [provider]  NON FORMULARY Diltiazem 2%/Lidocaine5% compound Use 3 x rectally daily for 6 weeks to heal anal fissure 09/16/23  Yes [place], Bayley M, PA-C  potassium chloride SA (KLOR-CON M) 20 MEQ tablet TAKE 1 TABLET BY MOUTH DAILY 08/22/23  Yes Donita Brooks, MD  pravastatin (PRAVACHOL) 20 MG tablet Take 1 tablet (20 mg total) by mouth daily. 08/20/22  Yes  Donita Brooks, MD  traZODone (DESYREL) 50 MG tablet take 1 tablet (50 MILLIGRAM total) by mouth at bedtime. 09/05/23  Yes Donita Brooks, MD  linaclotide [facility]) 145 MCG CAPS capsule TAKE 1 CAPSULE DAILY BEFORE BREAKFAST. 03/27/23   Donita Brooks, MD  oxyCODONE (OXY IR/ROXICODONE) 5 MG immediate release tablet Take 1 tablet (5 mg total) by mouth every 6 (six) hours as needed for severe pain (pain score 7-10). 09/23/23   Donita Brooks, MD    Current Outpatient Medications  Medication Sig Dispense Refill   apixaban (ELIQUIS) 5 MG TABS tablet TAKE 1 TABLET BY MOUTH TWICE  DAILY 200 tablet 0   Ascorbic Acid (VITAMIN C PO) Take 1 tablet by mouth daily.     Casanthranol-Docusate Sodium (STOOL SOFTENER PLUS PO) Take by mouth.     colestipol (COLESTID) 1 g tablet Take by mouth.     Cyanocobalamin 1000 MCG CAPS Take 1 tablet by mouth daily. 90 capsule 3   dexlansoprazole (DEXILANT) 60 MG capsule TAKE 1 CAPSULE BY MOUTH DAILY 100 capsule 2   diclofenac Sodium (VOLTAREN) 1 % GEL APPLY TO AFFECTED AREAS 3 TIMES DAILY AS NEEDED. 100 g 0   Ergocalciferol (VITAMIN D2) 50 MCG (2000 UT) TABS tAKE 1 Tablet daily 30 tablet    famotidine (PEPCID) 20 MG tablet Take 20 mg by mouth 2 (two) times daily.     furosemide (LASIX) 40 MG tablet TAKE 1 TABLET BY MOUTH TWICE  DAILY AS NEEDED FOR EDEMA 200 tablet 0   levothyroxine (SYNTHROID) 125 MCG tablet Take 1 tablet (125 mcg total) by mouth daily. 90 tablet 3   lidocaine (XYLOCAINE) 5 % ointment APPLY TOPICALLY 4 TIMES  DAILY AS NEEDED 354.4 g 0   losartan (COZAAR) 50 MG tablet Take 1 tablet (50 mg total) by mouth daily. 90 tablet 3   magnesium oxide (MAG-OX) 400 MG tablet Take by mouth.     NON FORMULARY Diltiazem 2%/Lidocaine5% compound Use 3 x rectally daily for 6 weeks to heal anal fissure 30 g 1   potassium chloride SA (KLOR-CON M) 20 MEQ tablet TAKE 1 TABLET BY MOUTH DAILY 100 tablet 0   pravastatin (PRAVACHOL) 20 MG tablet Take 1 tablet (20 mg total)  by mouth daily. 100 tablet 1   traZODone (DESYREL) 50 MG tablet take 1 tablet (50 MILLIGRAM total) by mouth at bedtime. 30 tablet 0   linaclotide (LINZESS) 145 MCG CAPS capsule TAKE 1 CAPSULE DAILY BEFORE BREAKFAST. 90 capsule 0   oxyCODONE (OXY IR/ROXICODONE) 5 MG immediate release tablet Take 1 tablet (5 mg total) by mouth every 6 (six) hours as needed for severe pain (pain score 7-10). 120 tablet 0   Current Facility-Administered  Medications  Medication Dose Route Frequency Provider Last Rate Last Admin   0.9 %  sodium chloride infusion  500 mL Intravenous Continuous Jenel Lucks, MD        Allergies as of 09/24/2023 - Review Complete 09/24/2023  Allergen Reaction Noted   Codeine Hives 03/28/2011   Sulfa antibiotics Hives 03/28/2011   Bactrim [sulfamethoxazole-trimethoprim] Itching 08/25/2012   Lactose Diarrhea 04/04/2014   Nitrofuran derivatives Itching 03/29/2011   Penicillins Rash 12/05/2011    Family History  Problem Relation Age of Onset   Heart disease Mother        enlarged   Hypertension Mother    Stroke Mother    Diabetes Father    Colon polyps Sister    Diabetes Sister    Diabetes Maternal Grandmother    Colon cancer Neg Hx    Esophageal cancer Neg Hx    Rectal cancer Neg Hx    Stomach cancer Neg Hx     Social History   Socioeconomic History   Marital status: Widowed    Spouse name: Not on file   Number of children: 2   Years of education: Not on file   Highest education level: Not on file  Occupational History    Employer: UNEMPLOYED  Tobacco Use   Smoking status: Former    Current packs/day: 0.00    Average packs/day: 0.6 packs/day for 48.0 years (28.8 ttl pk-yrs)    Types: Cigarettes    Start date: 10/03/1968    Quit date: 10/03/2016    Years since quitting: 6.9   Smokeless tobacco: Never  Vaping Use   Vaping status: Never Used  Substance and Sexual Activity   Alcohol use: No   Drug use: No   Sexual activity: Yes    Birth  control/protection: Post-menopausal  Other Topics Concern   Not on file  Social History Narrative   Not on file   Social Drivers of Health   Financial Resource Strain: Medium Risk (08/09/2022)   Overall Financial Resource Strain (CARDIA)    Difficulty of Paying Living Expenses: Somewhat hard  Food Insecurity: No Food Insecurity (08/09/2022)   Hunger Vital Sign    Worried About Running Out of Food in the Last Year: Never true    Ran Out of Food in the Last Year: Never true  Transportation Needs: Unmet Transportation Needs (08/09/2022)   PRAPARE - Transportation    Lack of Transportation (Medical): Yes    Lack of Transportation (Non-Medical): Yes  Physical Activity: Insufficiently Active (08/09/2022)   Exercise Vital Sign    Days of Exercise per Week: 3 days    Minutes of Exercise per Session: 20 min  Stress: No Stress Concern Present (08/09/2022)   [employer] of Occupational Health - Occupational Stress Questionnaire    Feeling of Stress : Not at all  Social Connections: Unknown (08/09/2022)   Social Connection and Isolation Panel [NHANES]    Frequency of Communication with Friends and Family: Once a week    Frequency of Social Gatherings with Friends and Family: Once a week    Attends Religious Services: Not on Insurance claims handler of Clubs or Organizations: Yes    Attends Banker Meetings: 1 to 4 times per year    Marital Status: Widowed  Intimate Partner Violence: Not At Risk (08/09/2022)   Humiliation, Afraid, Rape, and Kick questionnaire    Fear of Current or Ex-Partner: No    Emotionally Abused: No    Physically Abused: No  Sexually Abused: No    Review of Systems:  All other review of systems negative except as mentioned in the HPI.  Physical Exam: Vital signs BP 125/78   Pulse 98   Temp (!) 97.3 F (36.3 C) (Temporal)   Ht 5\' 1"  (1.549 m)   Wt 195 lb 3.2 oz (88.5 kg)   SpO2 98%   BMI 36.88 kg/m   General:   Alert,  Well-developed,  well-nourished, pleasant and cooperative in NAD Airway:  Mallampati 2 Lungs:  Clear throughout to auscultation.   Heart:  Regular rate and rhythm; no murmurs, clicks, rubs,  or gallops. Abdomen:  Soft, nontender and nondistended. Normal bowel sounds.  Diverting ileostomy bag, large midline surgical scar  Neuro/Psych:  Normal mood and affect. A and O x 3   Kathan Kirker E. Tomasa Rand, MD [facility]

## 2023-09-24 NOTE — Op Note (Signed)
 [facility] Patient Name: [name] Procedure Date: 09/24/2023 11:25 AM MRN: [id number] Endoscopist: Lorin Picket E. Tomasa Rand , MD, [id number] Age: 73 Referring MD:  Date of Birth: Nov 30, 1950 Gender: Female Account #: [id number] Procedure:                Colonoscopy Indications:              Abnormal PET scan of the GI tract Medicines:                Monitored Anesthesia Care Procedure:                Pre-Anesthesia Assessment:                           - Prior to the procedure, a History and Physical                            was performed, and patient medications and                            allergies were reviewed. The patient's tolerance of                            previous anesthesia was also reviewed. The risks                            and benefits of the procedure and the sedation                            options and risks were discussed with the patient.                            All questions were answered, and informed consent                            was obtained. Prior Anticoagulants: The patient has                            taken Eliquis (apixaban), last dose was 3 days                            prior to procedure. ASA Grade Assessment: III - A                            patient with severe systemic disease. After                            reviewing the risks and benefits, the patient was                            deemed in satisfactory condition to undergo the                            procedure.  After obtaining informed consent, the colonoscope                            was passed under direct vision. Throughout the                            procedure, the patient's blood pressure, pulse, and                            oxygen saturations were monitored continuously. The                            CF [id number] #[id number] was introduced through the anus                            and advanced to the the terminal ileum,  with                            identification of the appendiceal orifice and IC                            valve. The Olympus Scope [id number] was introduced                            through the and advanced to the. The colonoscopy                            was somewhat difficult due to restricted mobility                            of the colon. Successful completion of the                            procedure was aided by using manual pressure. The                            patient tolerated the procedure well. The quality                            of the bowel preparation was fair. The terminal                            ileum, ileocecal valve, appendiceal orifice, and                            rectum were photographed. The bowel preparation                            used was SUFLAVE via split dose instruction. Scope In: 12:24:01 PM Scope Out: 12:43:33 PM Scope Withdrawal Time: 0 hours 14 minutes 35 seconds  Total Procedure Duration: 0 hours 19 minutes 32 seconds  Findings:                 The perianal  exam findings include a perianal skin                            irritation.                           The digital rectal exam findings include decreased                            sphincter tone. Pertinent negatives include no                            palpable rectal lesions. No ulcer or fissure was                            seen/palpated.                           In the proximal rectum, there was an angulated                            luminal narrowing. The mucosa here appeared                            somewhat erythematous. Biopsies were taken with a                            cold forceps for histology. Estimated blood loss                            was minimal. The rectal vault was small and                            narrowed. Attempts at rectal retroflexion were                            unsuccessful.                           The exam was otherwise normal  throughout the                            examined colon.                           The terminal ileum appeared normal, but was unable                            to be deeply intubated. Complications:            No immediate complications. Estimated Blood Loss:     Estimated blood loss was minimal. Impression:               - Preparation of the colon was fair.                           - Perianal rash  found on perianal exam. Suspect                            this is secondary to irritation from fecal material                           - Decreased sphincter tone found on digital rectal                            exam.                           - Fixed angulation with luminal narrowing in the                            proximal rectum. Biopsied. Suspect this is related                            to scar tissue from extensive abdominal/pelvic                            surgeries.                           - The examined portion of the ileum was normal.                           - No abnormalities in the transverse colon to                            correlate with PET findings. Recommendation:           - Patient has a contact number available for                            emergencies. The signs and symptoms of potential                            delayed complications were discussed with the                            patient. Return to normal activities tomorrow.                            Written discharge instructions were provided to the                            patient.                           - Resume previous diet.                           - Continue present medications.                           - Await  pathology results.                           - Resume Eliquis (apixaban) at prior dose tomorrow.                           - Recommend using barrier cream to allow perianal                            skin irritation to heal.                           - Given patient's age  and lack of colon cancer risk                            factors, would recommend against further colon                            cancer screening.                           - Would recommend she follow up with [facility] for                            further management of painful/difficult bowel                            movements and pelvic dyssynergia. Roselle Norton E. Tomasa Rand, MD 09/24/2023 12:58:24 PM This report has been signed electronically.

## 2023-09-24 NOTE — Progress Notes (Unsigned)
 Called to room to assist during endoscopic procedure.  Patient ID and intended procedure confirmed with present staff. Received instructions for my participation in the procedure from the performing physician.

## 2023-09-25 ENCOUNTER — Telehealth: Payer: Self-pay | Admitting: *Deleted

## 2023-09-25 NOTE — Telephone Encounter (Signed)
  Follow up Call-     09/24/2023   10:07 AM  Call back number  Post procedure Call Back phone  # [phone]  Permission to leave phone message Yes     Patient questions:  Do you have a fever, pain , or abdominal swelling? No. Pain Score  0 *  Have you tolerated food without any problems? Yes.    Have you been able to return to your normal activities? Yes.    Do you have any questions about your discharge instructions: Diet   No. Medications  No. Follow up visit  No.  Do you have questions or concerns about your Care? No.  Actions: * If pain score is 4 or above: No action needed, pain <4.

## 2023-09-26 DIAGNOSIS — Z936 Other artificial openings of urinary tract status: Secondary | ICD-10-CM | POA: Diagnosis not present

## 2023-09-26 LAB — SURGICAL PATHOLOGY

## 2023-09-29 ENCOUNTER — Encounter: Payer: Self-pay | Admitting: Gastroenterology

## 2023-09-29 NOTE — Progress Notes (Signed)
 [name],  The biopsies of your colon showed benign reactive changes.  There were no cancerous or precancerous changes.  As discussed, I would recommend against further colon cancer screening.  I would recommend following up with [facility] for further management of your chronic defecatory difficulties.

## 2023-10-06 ENCOUNTER — Other Ambulatory Visit: Payer: Self-pay | Admitting: Family Medicine

## 2023-10-06 DIAGNOSIS — E782 Mixed hyperlipidemia: Secondary | ICD-10-CM

## 2023-10-06 NOTE — Telephone Encounter (Signed)
 [name] - [place], [place] - [address]   Patient would like for her cholesterol medicine to be called into [name].  She did not know the name of the medicaiton   Thank you,  Judeth Cornfield,  AMB Clinical Support [facility] AW[facility] Program Direct Dial ??[phone]

## 2023-10-07 MED ORDER — PRAVASTATIN SODIUM 20 MG PO TABS: 20.0000 mg | ORAL_TABLET | Freq: Every day | ORAL | 1 refills | Status: DC

## 2023-10-07 NOTE — Telephone Encounter (Signed)
 Requested Prescriptions  Pending Prescriptions Disp Refills   pravastatin (PRAVACHOL) 20 MG tablet 100 tablet 1    Sig: Take 1 tablet (20 mg total) by mouth daily.     Cardiovascular:  Antilipid - Statins Failed - 10/07/2023 11:43 AM      Failed - Lipid Panel in normal range within the last 12 months    Cholesterol  Date Value Ref Range Status  07/01/2023 184 <200 mg/dL Final   LDL Cholesterol (Calc)  Date Value Ref Range Status  07/01/2023 88 mg/dL (calc) Final    Comment:    Reference range: <100 . Desirable range <100 mg/dL for primary prevention;   <70 mg/dL for patients with CHD or diabetic patients  with > or = 2 CHD risk factors. Marland Kitchen LDL-C is now calculated using the Martin-Hopkins  calculation, which is a validated novel method providing  better accuracy than the Friedewald equation in the  estimation of LDL-C.  Horald Pollen et al. Lenox Ahr. 1610;960(45): 2061-2068  (http://education.[employer].com/faq/FAQ164)    HDL  Date Value Ref Range Status  07/01/2023 72 > OR = 50 mg/dL Final   Triglycerides  Date Value Ref Range Status  07/01/2023 138 <150 mg/dL Final         Passed - Patient is not pregnant      Passed - Valid encounter within last 12 months    Recent Outpatient Visits           1 month ago Influenza A   [place] [facility] Pickard, Priscille Heidelberg, MD   2 months ago Essential hypertension   [place] [facility] Tanya Nones, Priscille Heidelberg, MD   3 months ago Encounter for screening mammogram for malignant neoplasm of breast   [place] [facility] Donita Brooks, MD   6 months ago Urinary tract infection with hematuria, site unspecified   [place] [facility] Park Meo, FNP   8 months ago Mass of left lung   [facility] Pickard, Priscille Heidelberg, MD

## 2023-10-15 ENCOUNTER — Ambulatory Visit

## 2023-10-15 VITALS — BP 124/85 | Ht 61.0 in | Wt 195.0 lb

## 2023-10-15 DIAGNOSIS — Z0000 Encounter for general adult medical examination without abnormal findings: Secondary | ICD-10-CM

## 2023-10-15 NOTE — Patient Instructions (Signed)

## 2023-10-15 NOTE — Progress Notes (Signed)
 Subjective:   [name] is a 73 y.o. female who presents for Medicare Annual (Subsequent) preventive examination.  Visit Complete: Virtual I connected with  [name] on 10/15/23 by a audio enabled telemedicine application and verified that I am speaking with the correct person using two identifiers.  Patient Location: Home  Provider Location: Home Office  I discussed the limitations of evaluation and management by telemedicine. The patient expressed understanding and agreed to proceed.  Vital Signs: Because this visit was a virtual/telehealth visit, some criteria may be missing or patient reported. Any vitals not documented were not able to be obtained and vitals that have been documented are patient reported.  Patient Medicare AWV questionnaire was completed by the patient on 10/15/2023; I have confirmed that all information answered by patient is correct and no changes since this date.        Objective:    Today's Vitals   10/15/23 1125  BP: 124/85  Weight: 195 lb (88.5 kg)  Height: 5\' 1"  (1.549 m)   Body mass index is 36.84 kg/m.     08/09/2022   11:44 AM 08/03/2021   10:43 AM 08/26/2020   10:26 PM 04/04/2020    2:59 PM 09/11/2019   12:58 PM 10/13/2017    2:18 PM 12/16/2016    2:42 PM  Advanced Directives  Does Patient Have a Medical Advance Directive? No No No No No No No  Would patient like information on creating a medical advance directive?  No - Patient declined No - Patient declined No - Patient declined  No - Patient declined No - Patient declined    Current Medications (verified) Outpatient Encounter Medications as of 10/15/2023  Medication Sig   apixaban (ELIQUIS) 5 MG TABS tablet TAKE 1 TABLET BY MOUTH TWICE  DAILY   Ascorbic Acid (VITAMIN C PO) Take 1 tablet by mouth daily.   Casanthranol-Docusate Sodium (STOOL SOFTENER PLUS PO) Take by mouth.   colestipol (COLESTID) 1 g tablet Take by mouth.   Cyanocobalamin 1000 MCG CAPS Take 1 tablet by  mouth daily.   dexlansoprazole (DEXILANT) 60 MG capsule TAKE 1 CAPSULE BY MOUTH DAILY   diclofenac Sodium (VOLTAREN) 1 % GEL APPLY TO AFFECTED AREAS 3 TIMES DAILY AS NEEDED.   Ergocalciferol (VITAMIN D2) 50 MCG (2000 UT) TABS tAKE 1 Tablet daily   famotidine (PEPCID) 20 MG tablet Take 20 mg by mouth 2 (two) times daily.   furosemide (LASIX) 40 MG tablet TAKE 1 TABLET BY MOUTH TWICE  DAILY AS NEEDED FOR EDEMA   levothyroxine (SYNTHROID) 125 MCG tablet Take 1 tablet (125 mcg total) by mouth daily.   lidocaine (XYLOCAINE) 5 % ointment APPLY TOPICALLY 4 TIMES  DAILY AS NEEDED   linaclotide (LINZESS) 145 MCG CAPS capsule TAKE 1 CAPSULE DAILY BEFORE BREAKFAST.   losartan (COZAAR) 50 MG tablet Take 1 tablet (50 mg total) by mouth daily.   magnesium oxide (MAG-OX) 400 MG tablet Take by mouth.   NON FORMULARY Diltiazem 2%/Lidocaine5% compound Use 3 x rectally daily for 6 weeks to heal anal fissure   oxyCODONE (OXY IR/ROXICODONE) 5 MG immediate release tablet Take 1 tablet (5 mg total) by mouth every 6 (six) hours as needed for severe pain (pain score 7-10).   potassium chloride SA (KLOR-CON M) 20 MEQ tablet TAKE 1 TABLET BY MOUTH DAILY   pravastatin (PRAVACHOL) 20 MG tablet Take 1 tablet (20 mg total) by mouth daily.   traZODone (DESYREL) 50 MG tablet take 1 tablet (50  MILLIGRAM total) by mouth at bedtime.   No facility-administered encounter medications on file as of 10/15/2023.    Allergies (verified) Codeine, Sulfa antibiotics, Bactrim [sulfamethoxazole-trimethoprim], Lactose, Nitrofuran derivatives, and Penicillins   History: Past Medical History:  Diagnosis Date   Acid reflux    Bowel obstruction (HCC) several   Recurrent SBO secondary to adhesions   Cancer [facility] 2011 [facility])   diagnosed in 2009 per pt. Invasive High grade Urothelial carcinoma- s/p radiation and Chemo    Chronic back pain    Clotting disorder (HCC)    DVT (deep venous thrombosis) (HCC) 2015   Fatty liver    Gout     Hyperlipidemia    Hypertension    pt says taken off medication since has lost weight.   Hypothyroidism    Internal hemorrhoids    Leukopenia 12/06/2011   HIV serology negative   Malnutrition (HCC)    PE (pulmonary embolism)    [facility]   Rectal ulcer 2016   [facility]   UTI (lower urinary tract infection)    Recurrent   Past Surgical History:  Procedure Laterality Date   ABDOMINAL SURGERY     with intestinal "puncture" x 2, exploratory surgery    BACK SURGERY     X2   BLADDER REMOVAL     CHOLECYSTECTOMY     CYSTECTOMY     breast   HERNIA REPAIR     mesh   ILEO CONDUIT     For bladder cancer   PORTACATH PLACEMENT     Family History  Problem Relation Age of Onset   Heart disease Mother        enlarged   Hypertension Mother    Stroke Mother    Diabetes Father    Colon polyps Sister    Diabetes Sister    Diabetes Maternal Grandmother    Colon cancer Neg Hx    Esophageal cancer Neg Hx    Rectal cancer Neg Hx    Stomach cancer Neg Hx    Social History   Socioeconomic History   Marital status: Widowed    Spouse name: Not on file   Number of children: 2   Years of education: Not on file   Highest education level: Not on file  Occupational History    Employer: UNEMPLOYED  Tobacco Use   Smoking status: Former    Current packs/day: 0.00    Average packs/day: 0.6 packs/day for 48.0 years (28.8 ttl pk-yrs)    Types: Cigarettes    Start date: 10/03/1968    Quit date: 10/03/2016    Years since quitting: 7.0   Smokeless tobacco: Never  Vaping Use   Vaping status: Never Used  Substance and Sexual Activity   Alcohol use: No   Drug use: No   Sexual activity: Yes    Birth control/protection: Post-menopausal  Other Topics Concern   Not on file  Social History Narrative   Not on file   Social Drivers of Health   Financial Resource Strain: Medium Risk (08/09/2022)   Overall Financial Resource Strain (CARDIA)    Difficulty of Paying Living Expenses:  Somewhat hard  Food Insecurity: No Food Insecurity (08/09/2022)   Hunger Vital Sign    Worried About Running Out of Food in the Last Year: Never true    Ran Out of Food in the Last Year: Never true  Transportation Needs: Unmet Transportation Needs (08/09/2022)   PRAPARE - Administrator, Civil Service (Medical): Yes  Lack of Transportation (Non-Medical): Yes  Physical Activity: Insufficiently Active (08/09/2022)   Exercise Vital Sign    Days of Exercise per Week: 3 days    Minutes of Exercise per Session: 20 min  Stress: No Stress Concern Present (08/09/2022)   [employer] of Occupational Health - Occupational Stress Questionnaire    Feeling of Stress : Not at all  Social Connections: Unknown (08/09/2022)   Social Connection and Isolation Panel [NHANES]    Frequency of Communication with Friends and Family: Once a week    Frequency of Social Gatherings with Friends and Family: Once a week    Attends Religious Services: Not on Insurance claims handler of Clubs or Organizations: Yes    Attends Banker Meetings: 1 to 4 times per year    Marital Status: Widowed    Tobacco Counseling Counseling given: Not Answered   Clinical Intake:                        Activities of Daily Living     No data to display           Patient Care Team: Austine Lefort, MD as PCP - General (Family Medicine) Riley Cheadle, Windsor Hatcher, MD as Attending Physician (Gastroenterology)  Indicate any recent Medical Services you may have received from other than [facility] providers in the past year (date may be approximate).     Assessment:   This is a routine wellness examination for [name].  Hearing/Vision screen No results found.   Goals Addressed   None   Depression Screen    08/29/2023    2:52 PM 07/29/2023   11:13 AM 07/01/2023   11:44 AM 08/09/2022   11:42 AM 06/06/2022    3:38 PM 02/19/2022   11:32 AM 08/03/2021   10:38 AM  PHQ 2/9 Scores  PHQ - 2 Score 4 0 0 0 1  3 0  PHQ- 9 Score 17     13     Fall Risk    07/29/2023   11:13 AM 07/01/2023   11:44 AM 06/06/2022    3:37 PM 08/03/2021   10:43 AM 04/28/2020   10:59 AM  Fall Risk   Falls in the past year? 0 0 0 0 0  Number falls in past yr: 0 0 0 0 0  Injury with Fall? 0 0 0 0 0  Risk for fall due to : No Fall Risks No Fall Risks No Fall Risks Medication side effect   Follow up Falls prevention discussed Falls prevention discussed Falls prevention discussed Falls prevention discussed Falls evaluation completed    MEDICARE RISK AT HOME:    TIMED UP AND GO:  Was the test performed?  No    Cognitive Function:        08/09/2022   11:46 AM 08/03/2021   10:46 AM  6CIT Screen  What Year? 0 points 0 points  What month? 0 points 0 points  What time? 0 points 0 points  Count back from 20 0 points 0 points  Months in reverse 0 points 0 points  Repeat phrase 2 points 0 points  Total Score 2 points 0 points    Immunizations Immunization History  Administered Date(s) Administered   PFIZER(Purple Top)SARS-COV-2 Vaccination 07/22/2019, 08/12/2019   PPD Test 01/23/2015   Tdap 04/18/2017    TDAP status: Up to date  Flu Vaccine status: Declined, Education has been provided regarding the importance of this vaccine but  patient still declined. Advised may receive this vaccine at local pharmacy or Health Dept. Aware to provide a copy of the vaccination record if obtained from local pharmacy or Health Dept. Verbalized acceptance and understanding.  Pneumococcal vaccine status: Due, Education has been provided regarding the importance of this vaccine. Advised may receive this vaccine at local pharmacy or Health Dept. Aware to provide a copy of the vaccination record if obtained from local pharmacy or Health Dept. Verbalized acceptance and understanding.  Covid-19 vaccine status: Information provided on how to obtain vaccines.   Qualifies for Shingles Vaccine? Yes   Zostavax completed No   Shingrix  Completed?: No.    Education has been provided regarding the importance of this vaccine. Patient has been advised to call insurance company to determine out of pocket expense if they have not yet received this vaccine. Advised may also receive vaccine at local pharmacy or Health Dept. Verbalized acceptance and understanding.  Screening Tests Health Maintenance  Topic Date Due   Zoster Vaccines- Shingrix (1 of 2) Never done   COVID-19 Vaccine (3 - Pfizer risk series) 09/09/2019   Pneumonia Vaccine 75+ Years old (1 of 1 - PCV) 06/30/2024 (Originally 05/24/2001)   Lung Cancer Screening  12/20/2023   INFLUENZA VACCINE  01/30/2024   Medicare Annual Wellness (AWV)  10/14/2024   MAMMOGRAM  08/05/2025   DTaP/Tdap/Td (2 - Td or Tdap) 04/19/2027   Colonoscopy  09/23/2033   DEXA SCAN  Completed   Hepatitis C Screening  Completed   HPV VACCINES  Aged Out   Meningococcal B Vaccine  Aged Out    Health Maintenance  Health Maintenance Due  Topic Date Due   Zoster Vaccines- Shingrix (1 of 2) Never done   COVID-19 Vaccine (3 - Pfizer risk series) 09/09/2019    Colorectal cancer screening: Type of screening: Colonoscopy. Completed 09/24/2023. Repeat every 10 years  Mammogram status: Completed 08/06/23. Repeat every year   Lung Cancer Screening: (Low Dose CT Chest recommended if Age 73-80 years, 20 pack-year currently smoking OR have quit w/in 15years.) does qualify.   Lung Cancer Screening Referral: completed  Additional Screening:  Hepatitis C Screening: does qualify; Completed   Vision Screening: Recommended annual ophthalmology exams for early detection of glaucoma and other disorders of the eye. Is the patient up to date with their annual eye exam?  Yes  Who is the provider or what is the name of the office in which the patient attends annual eye exams? [facility] If pt is not established with a provider, would they like to be referred to a provider to establish care? No .   Dental  Screening: Recommended annual dental exams for proper oral hygiene  Diabetic Foot Exam:   Community Resource Referral / Chronic Care Management: CRR required this visit?  No   CCM required this visit?  No     Plan:     I have personally reviewed and noted the following in the patient's chart:   Medical and social history Use of alcohol, tobacco or illicit drugs  Current medications and supplements including opioid prescriptions. Patient is currently taking opioid prescriptions. Information provided to patient regarding non-opioid alternatives. Patient advised to discuss non-opioid treatment plan with their provider. Functional ability and status Nutritional status Physical activity Advanced directives List of other physicians Hospitalizations, surgeries, and ER visits in previous 12 months Vitals Screenings to include cognitive, depression, and falls Referrals and appointments  In addition, I have reviewed and discussed with patient certain preventive protocols,  quality metrics, and best practice recommendations. A written personalized care plan for preventive services as well as general preventive health recommendations were provided to patient.     Alvina Axon   10/15/2023   After Visit Summary: (MyChart) Due to this being a telephonic visit, the after visit summary with patients personalized plan was offered to patient via MyChart   Nurse Notes:   [name] , Thank you for taking time to come for your Medicare Wellness Visit. I appreciate your ongoing commitment to your health goals. Please review the following plan we discussed and let me know if I can assist you in the future.   These are the goals we discussed:  Goals      Activity and Exercise Increased     Evidence-based guidance:  Review current exercise levels.  Assess patient perspective on exercise or activity level, barriers to increasing activity, motivation and readiness for change.  Recommend or  set healthy exercise goal based on individual tolerance.  Encourage small steps toward making change in amount of exercise or activity.  Urge reduction of sedentary activities or screen time.  Promote group activities within the community or with family or support person.  Consider referral to rehabiliation therapist for assessment and exercise/activity plan.   Notes:      Exercise 3x per week (30 min per time)     Keep doing exercises and continue to eat healthy.        This is a list of the screening recommended for you and due dates:  Health Maintenance  Topic Date Due   Zoster (Shingles) Vaccine (1 of 2) Never done   COVID-19 Vaccine (3 - Pfizer risk series) 09/09/2019   Pneumonia Vaccine (1 of 1 - PCV) 06/30/2024*   Screening for Lung Cancer  12/20/2023   Flu Shot  01/30/2024   Medicare Annual Wellness Visit  10/14/2024   Mammogram  08/05/2025   DTaP/Tdap/Td vaccine (2 - Td or Tdap) 04/19/2027   Colon Cancer Screening  09/23/2033   DEXA scan (bone density measurement)  Completed   Hepatitis C Screening  Completed   HPV Vaccine  Aged Out   Meningitis B Vaccine  Aged Out  *Topic was postponed. The date shown is not the original due date.

## 2023-10-22 ENCOUNTER — Other Ambulatory Visit: Payer: Self-pay | Admitting: Family Medicine

## 2023-10-22 NOTE — Telephone Encounter (Signed)
 Requested Prescriptions  Pending Prescriptions Disp Refills   traZODone  (DESYREL ) 50 MG tablet [Pharmacy Med Name: TRAZODONE  50 MG TABLET] 90 tablet 1    Sig: take 1 tablet (50 milligram total) by mouth at bedtime.     Psychiatry: Antidepressants - Serotonin Modulator Failed - 10/22/2023  5:21 PM      Failed - Valid encounter within last 6 months    Recent Outpatient Visits           1 month ago Influenza A   [place] [facility] Pickard, Cisco Crest, MD   2 months ago Essential hypertension   [place] [facility] Cheril Cork, Cisco Crest, MD   3 months ago Encounter for screening mammogram for malignant neoplasm of breast   [place] [facility] Austine Lefort, MD   7 months ago Urinary tract infection with hematuria, site unspecified   [place] [facility] Jenelle Mis, FNP   9 months ago Mass of left lung   [place] [facility] Pickard, Cisco Crest, MD

## 2023-10-27 ENCOUNTER — Other Ambulatory Visit: Payer: Self-pay | Admitting: Family Medicine

## 2023-10-27 NOTE — Telephone Encounter (Signed)
 Copied from CRM [id number]. Topic: Clinical - Medication Refill >> Oct 27, 2023 12:45 PM Felizardo Hotter wrote: Most Recent Primary Care Visit:  Provider: Alvina Axon  Department: [facility]  Visit Type: ANNUAL WELL VISIT, SEQUENTIAL  Date: 10/15/2023  Medication: oxyCODONE  (OXY IR/ROXICODONE ) 5 MG immediate release tablet  Has the patient contacted their pharmacy? Yes (Agent: If no, request that the patient contact the pharmacy for the refill. If patient does not wish to contact the pharmacy document the reason why and proceed with request.) (Agent: If yes, when and what did the pharmacy advise?)Pharmacy need PCP approval  Is this the correct pharmacy for this prescription? Yes If no, delete pharmacy and type the correct one.  This is the patient's preferred pharmacy:  [facility] - [place], [place] - [address] [place] [address] Phone: [phone] Fax: [phone]  Has the prescription been filled recently? Yes  Is the patient out of the medication? Yes  Has the patient been seen for an appointment in the last year OR does the patient have an upcoming appointment? Yes  Can we respond through MyChart? Yes  Agent: Please be advised that Rx refills may take up to 3 business days. We ask that you follow-up with your pharmacy.

## 2023-10-28 ENCOUNTER — Other Ambulatory Visit: Payer: Self-pay | Admitting: Family Medicine

## 2023-10-29 NOTE — Telephone Encounter (Signed)
 Requested medication (s) are due for refill today: yes  Requested medication (s) are on the active medication list: yes  Last refill:  09/23/23  Future visit scheduled: no  Notes to clinic:  Unable to refill per protocol, cannot delegate.      Requested Prescriptions  Pending Prescriptions Disp Refills   oxyCODONE  (OXY IR/ROXICODONE ) 5 MG immediate release tablet 120 tablet 0    Sig: Take 1 tablet (5 mg total) by mouth every 6 (six) hours as needed for severe pain (pain score 7-10).     Not Delegated - Analgesics:  Opioid Agonists Failed - 10/29/2023  2:56 PM      Failed - This refill cannot be delegated      Failed - Urine Drug Screen completed in last 360 days      Failed - Valid encounter within last 3 months    Recent Outpatient Visits           2 months ago Influenza A   [place] [facility] Austine Lefort, MD   3 months ago Essential hypertension   [place] [facility] Cheril Cork, Cisco Crest, MD   4 months ago Encounter for screening mammogram for malignant neoplasm of breast   [place] [facility] Austine Lefort, MD   7 months ago Urinary tract infection with hematuria, site unspecified   [place] [facility] Jenelle Mis, FNP   9 months ago Mass of left lung   [place] [facility] Pickard, Cisco Crest, MD

## 2023-10-30 MED ORDER — OXYCODONE HCL 5 MG PO TABS
5.0000 mg | ORAL_TABLET | Freq: Four times a day (QID) | ORAL | 0 refills | Status: DC | PRN
Start: 2023-10-30 — End: 2023-11-28

## 2023-10-31 DIAGNOSIS — Z936 Other artificial openings of urinary tract status: Secondary | ICD-10-CM | POA: Diagnosis not present

## 2023-11-19 ENCOUNTER — Other Ambulatory Visit: Payer: Self-pay | Admitting: Family Medicine

## 2023-11-19 DIAGNOSIS — K56609 Unspecified intestinal obstruction, unspecified as to partial versus complete obstruction: Secondary | ICD-10-CM

## 2023-11-19 DIAGNOSIS — G8929 Other chronic pain: Secondary | ICD-10-CM

## 2023-11-20 NOTE — Telephone Encounter (Signed)
 Requested Prescriptions  Pending Prescriptions Disp Refills   linaclotide  (LINZESS ) 145 MCG CAPS capsule [Pharmacy Med Name: LINZESS  145 MCG CAPSULE] 90 capsule 1    Sig: take 1 capsule daily before breakfast.     Gastroenterology: Irritable Bowel Syndrome Passed - 11/20/2023  2:46 PM      Passed - Valid encounter within last 12 months    Recent Outpatient Visits           2 months ago Influenza A   [place] [facility] Austine Lefort, MD   3 months ago Essential hypertension   [place] [facility] Cheril Cork, Cisco Crest, MD   4 months ago Encounter for screening mammogram for malignant neoplasm of breast   [place] [facility] Austine Lefort, MD   8 months ago Urinary tract infection with hematuria, site unspecified   [place] [facility] Jenelle Mis, FNP   10 months ago Mass of left lung   [place] [facility] Pickard, Cisco Crest, MD

## 2023-11-28 ENCOUNTER — Other Ambulatory Visit: Payer: Self-pay | Admitting: Family Medicine

## 2023-11-28 NOTE — Telephone Encounter (Signed)
 Copied from CRM [id number]. Topic: Clinical - Medication Refill >> Nov 28, 2023 11:17 AM Felizardo Hotter wrote: Medication: oxyCODONE  (OXY IR/ROXICODONE ) 5 MG immediate release tablet  Has the patient contacted their pharmacy? Yes (Agent: If no, request that the patient contact the pharmacy for the refill. If patient does not wish to contact the pharmacy document the reason why and proceed with request.) (Agent: If yes, when and what did the pharmacy advise?)  This is the patient's preferred pharmacy:  [facility] - [place], [place] - [address] [place] [address] Phone: [phone] Fax: [phone]  Is this the correct pharmacy for this prescription? Yes If no, delete pharmacy and type the correct one.   Has the prescription been filled recently? Yes  Is the patient out of the medication? Yes  Has the patient been seen for an appointment in the last year OR does the patient have an upcoming appointment? Yes  Can we respond through MyChart? Yes  Agent: Please be advised that Rx refills may take up to 3 business days. We ask that you follow-up with your pharmacy.

## 2023-11-29 NOTE — Telephone Encounter (Signed)
 Requested medication (s) are due for refill today: Yes  Requested medication (s) are on the active medication list: Yes  Last refill:  10/30/23  Future visit scheduled: No  Notes to clinic:  Unable to refill per protocol, cannot delegate.      Requested Prescriptions  Pending Prescriptions Disp Refills   oxyCODONE  (OXY IR/ROXICODONE ) 5 MG immediate release tablet 120 tablet 0    Sig: Take 1 tablet (5 mg total) by mouth every 6 (six) hours as needed for severe pain (pain score 7-10).     Not Delegated - Analgesics:  Opioid Agonists Failed - 11/29/2023  5:32 PM      Failed - This refill cannot be delegated      Failed - Urine Drug Screen completed in last 360 days      Failed - Valid encounter within last 3 months    Recent Outpatient Visits           3 months ago Influenza A   [place] [facility] Austine Lefort, MD   4 months ago Essential hypertension   [place] [facility] Cheril Cork, Cisco Crest, MD   5 months ago Encounter for screening mammogram for malignant neoplasm of breast   [place] [facility] Austine Lefort, MD   8 months ago Urinary tract infection with hematuria, site unspecified   [place] [facility] Jenelle Mis, FNP   10 months ago Mass of left lung   [place] [facility] Pickard, Cisco Crest, MD

## 2023-12-01 MED ORDER — OXYCODONE HCL 5 MG PO TABS: 5.0000 mg | ORAL_TABLET | Freq: Four times a day (QID) | ORAL | 0 refills | Status: DC | PRN

## 2023-12-02 DIAGNOSIS — R079 Chest pain, unspecified: Secondary | ICD-10-CM | POA: Diagnosis not present

## 2023-12-02 DIAGNOSIS — I82623 Acute embolism and thrombosis of deep veins of upper extremity, bilateral: Secondary | ICD-10-CM | POA: Diagnosis not present

## 2023-12-02 DIAGNOSIS — Z936 Other artificial openings of urinary tract status: Secondary | ICD-10-CM | POA: Diagnosis not present

## 2023-12-12 DIAGNOSIS — I82B12 Acute embolism and thrombosis of left subclavian vein: Secondary | ICD-10-CM | POA: Diagnosis not present

## 2023-12-12 DIAGNOSIS — I82623 Acute embolism and thrombosis of deep veins of upper extremity, bilateral: Secondary | ICD-10-CM | POA: Diagnosis not present

## 2023-12-17 ENCOUNTER — Ambulatory Visit: Payer: Self-pay | Admitting: *Deleted

## 2023-12-17 NOTE — Telephone Encounter (Signed)
 Copied from CRM [id number]. Topic: Clinical - Red Word Triage >> Dec 17, 2023 10:06 AM Zipporah Him wrote: Red Word that prompted transfer to Nurse Triage: Feet ache at night and had been hurting for about 6 months, recently in the last couple days had gotten much worse hurting all  night. Can't sleep. Using bengay,   ----------------------------------------------------------------------- From previous Reason for Contact - Scheduling: Patient/patient representative is calling to schedule an appointment. Refer to attachments for appointment information. Reason for Disposition  [1] Tingling in feet AND [2] new or increased  Answer Assessment - Initial Assessment Questions 1. ONSET: When did the pain start?      I'm having tingling sensation in both of my feet.   Do not have diabetes.    2. LOCATION: Where is the pain located?      Tingling/pain in both feet.  I can't sleep because of it.   3. PAIN: How bad is the pain?    (Scale 1-10; or mild, moderate, severe)  - MILD (1-3): doesn't interfere with normal activities.   - MODERATE (4-7): interferes with normal activities (e.g., work or school) or awakens from sleep, limping.   - SEVERE (8-10): excruciating pain, unable to do any normal activities, unable to walk.      Moderate 4. WORK OR EXERCISE: Has there been any recent work or exercise that involved this part of the body?      N/A 5. CAUSE: What do you think is causing the foot pain?     I don't know. 6. OTHER SYMPTOMS: Do you have any other symptoms? (e.g., leg pain, rash, fever, numbness)     It's gotten worse over the last 6 months but lately it's been worse. 7. PREGNANCY: Is there any chance you are pregnant? When was your last menstrual period?     N/A due to age  Protocols used: Foot Pain-A-AH FYI Only or Action Required?: FYI only for provider  Patient was last seen in primary care on 08/29/2023 by Austine Lefort, MD. Called Nurse Triage reporting Foot Injury.  Symptoms began several months ago. Interventions attempted: OTC medications: Bengay. Symptoms are: gradually worsening.  Triage Disposition: See PCP When Office is Open (Within 3 Days)  Patient/caregiver understands and will follow disposition?: Yes

## 2023-12-18 ENCOUNTER — Ambulatory Visit (INDEPENDENT_AMBULATORY_CARE_PROVIDER_SITE_OTHER): Admitting: Family Medicine

## 2023-12-18 ENCOUNTER — Encounter: Payer: Self-pay | Admitting: Family Medicine

## 2023-12-18 VITALS — BP 124/84 | HR 92 | Temp 98.0°F | Ht 61.0 in | Wt 197.4 lb

## 2023-12-18 DIAGNOSIS — R7303 Prediabetes: Secondary | ICD-10-CM | POA: Diagnosis not present

## 2023-12-18 DIAGNOSIS — E039 Hypothyroidism, unspecified: Secondary | ICD-10-CM

## 2023-12-18 DIAGNOSIS — G609 Hereditary and idiopathic neuropathy, unspecified: Secondary | ICD-10-CM | POA: Diagnosis not present

## 2023-12-18 MED ORDER — LEVOTHYROXINE SODIUM 125 MCG PO TABS
125.0000 ug | ORAL_TABLET | Freq: Every day | ORAL | 3 refills | Status: AC
Start: 2023-12-18 — End: ?

## 2023-12-18 MED ORDER — GABAPENTIN 300 MG PO CAPS: 300.0000 mg | ORAL_CAPSULE | Freq: Three times a day (TID) | ORAL | 3 refills | Status: DC | PRN

## 2023-12-18 NOTE — Progress Notes (Signed)
 +  Subjective:    Patient ID: [name], female    DOB: 1950-10-23, 73 y.o.   MRN: [id number]  HPI  Patient is a very pleasant 73 year old African-American female with a very complicated past medical history.  She states that in the late 90s around 1998 she had back surgery.  At that time she had a accidental perforation in her intestines during the back surgery that required surgical correction.  Then in 2012 she was found to have bladder cancer.  She underwent surgical treatment of her bladder cancer and suffered a perforation to her intestines as well.  Ultimately she developed a small bowel obstruction due to adhesions and was unable to eat.  She states she lost more than 100 pounds.  She ultimately was admitted to [facility] and underwent TPN.  She required TPN for 3 years from 2012 for 2015 due to inability to tolerate oral food because of bowel obstructions.  Ultimately she underwent surgery in 2015 after she developed a fistula at the stoma of her bladder after her bladder was removed due to bladder cancer.  During the surgery to correct her urostomy, the patient also went lysis of adhesions.  At that point the bowel obstruction resolved and the patient was able to start tolerating oral food again.  She is on Eliquis  due to recurrent DVTs in her upper extremities stemming from her long-term use of TPN.  She is on chronic pain medication.  Patient has terrible rectal pain.  She states that it feels like she is being torn in half every time she has a bowel movement.  She had pelvic floor muscular dysfunction due to the severity of the pain and she is working with a specialist in [place].  Prior to working with a specialist for pelvic floor dysfunction, the patient was having 30 bowel movements a day that were searing and tearing with pain.  She has seen numerous specialist who cannot find anything wrong however she continues to have horrific pain with defecation.  However the  specialist has been able to reduce her frequency of BMs to 5 or 6 a day which is made her quality of life much better.  She still takes MS Contin  twice a day and oxycodone  2-3 times a day due to the severity of pain.  A lot of her pain sounds neuropathic in nature versus also rectal fissures due to the end complete relaxation of the anal sphincter.    The above was copied from a previous visit as a summary of her PMH.  Patient reports tingling and burning pain in both feet especially at night.  When she lays down at night she will feel pins-and-needles in the soles of her feet.  She has 2/4 dorsalis pedis and posterior tibialis pulses today on exam.  The skin is warm and well-perfused.  Past medical history.  Is significant for B12 deficiency, hypothyroidism, and prediabetes.  She denies any pain in her feet.  The daytime.  It only occurs at night. Past Medical History:  Diagnosis Date   Acid reflux    Bowel obstruction (HCC) several   Recurrent SBO secondary to adhesions   Cancer [facility]) 2011 [facility])   diagnosed in 2009 per pt. Invasive High grade Urothelial carcinoma- s/p radiation and Chemo    Chronic back pain    Clotting disorder (HCC)    DVT (deep venous thrombosis) (HCC) 2015   Fatty liver    Gout    Hyperlipidemia  Hypertension    pt says taken off medication since has lost weight.   Hypothyroidism    Internal hemorrhoids    Leukopenia 12/06/2011   HIV serology negative   Malnutrition (HCC)    PE (pulmonary embolism)    PER [facility] records   Rectal ulcer 2016   [facility]   UTI (lower urinary tract infection)    Recurrent   Past Surgical History:  Procedure Laterality Date   ABDOMINAL SURGERY     with intestinal puncture x 2, exploratory surgery    BACK SURGERY     X2   BLADDER REMOVAL     CHOLECYSTECTOMY     CYSTECTOMY     breast   HERNIA REPAIR     mesh   ILEO CONDUIT     For bladder cancer   PORTACATH PLACEMENT     Current Outpatient Medications on  File Prior to Visit  Medication Sig Dispense Refill   Ascorbic Acid (VITAMIN C PO) Take 1 tablet by mouth daily.     Casanthranol-Docusate Sodium  (STOOL SOFTENER PLUS PO) Take by mouth.     colestipol (COLESTID) 1 g tablet Take by mouth.     Cyanocobalamin  1000 MCG CAPS Take 1 tablet by mouth daily. 90 capsule 3   dexlansoprazole  (DEXILANT ) 60 MG capsule TAKE 1 CAPSULE BY MOUTH DAILY 100 capsule 2   diclofenac  Sodium (VOLTAREN ) 1 % GEL APPLY TO AFFECTED AREAS 3 TIMES DAILY AS NEEDED. 100 g 0   ELIQUIS  5 MG TABS tablet TAKE 1 TABLET BY MOUTH TWICE  DAILY 200 tablet 2   Ergocalciferol (VITAMIN D2) 50 MCG (2000 UT) TABS tAKE 1 Tablet daily 30 tablet    famotidine  (PEPCID ) 20 MG tablet Take 20 mg by mouth 2 (two) times daily.     furosemide  (LASIX ) 40 MG tablet TAKE 1 TABLET BY MOUTH TWICE  DAILY AS NEEDED FOR EDEMA 200 tablet 2   lidocaine  (XYLOCAINE ) 5 % ointment APPLY TOPICALLY 4 TIMES  DAILY AS NEEDED 354.4 g 0   linaclotide  (LINZESS ) 145 MCG CAPS capsule take 1 capsule daily before breakfast. 90 capsule 1   losartan  (COZAAR ) 50 MG tablet Take 1 tablet (50 mg total) by mouth daily. 90 tablet 3   magnesium  oxide (MAG-OX) 400 MG tablet Take by mouth.     NON FORMULARY Diltiazem 2%/Lidocaine5% compound Use 3 x rectally daily for 6 weeks to heal anal fissure 30 g 1   oxyCODONE  (OXY IR/ROXICODONE ) 5 MG immediate release tablet Take 1 tablet (5 mg total) by mouth every 6 (six) hours as needed for severe pain (pain score 7-10). 120 tablet 0   potassium chloride  SA (KLOR-CON  M) 20 MEQ tablet TAKE 1 TABLET BY MOUTH DAILY 100 tablet 2   pravastatin  (PRAVACHOL ) 20 MG tablet Take 1 tablet (20 mg total) by mouth daily. 100 tablet 1   traZODone  (DESYREL ) 50 MG tablet take 1 tablet (50 milligram total) by mouth at bedtime. 90 tablet 1   No current facility-administered medications on file prior to visit.   Allergies  Allergen Reactions   Codeine Hives   Sulfa Antibiotics Hives   Bactrim  [Sulfamethoxazole-Trimethoprim] Itching    Reports severe itching.   Lactose Diarrhea   Nitrofuran Derivatives Itching   Penicillins Rash    Has patient had a PCN reaction causing immediate rash, facial/tongue/throat swelling, SOB or lightheadedness with hypotension: No Has patient had a PCN reaction causing severe rash involving mucus membranes or skin necrosis: No Has patient had a PCN  reaction that required hospitalization: No Has patient had a PCN reaction occurring within the last 10 years: No If all of the above answers are NO, then may proceed with Cephalosporin use.  Childhood rash - no adult reactions known   Social History   Socioeconomic History   Marital status: Widowed    Spouse name: Not on file   Number of children: 2   Years of education: Not on file   Highest education level: Not on file  Occupational History    Employer: UNEMPLOYED  Tobacco Use   Smoking status: Former    Current packs/day: 0.00    Average packs/day: 0.6 packs/day for 48.0 years (28.8 ttl pk-yrs)    Types: Cigarettes    Start date: 10/03/1968    Quit date: 10/03/2016    Years since quitting: 7.2   Smokeless tobacco: Never  Vaping Use   Vaping status: Never Used  Substance and Sexual Activity   Alcohol use: No   Drug use: No   Sexual activity: Yes    Birth control/protection: Post-menopausal  Other Topics Concern   Not on file  Social History Narrative   Not on file   Social Drivers of Health   Financial Resource Strain: Medium Risk (10/15/2023)   Overall Financial Resource Strain (CARDIA)    Difficulty of Paying Living Expenses: Somewhat hard  Food Insecurity: No Food Insecurity (10/15/2023)   Hunger Vital Sign    Worried About Running Out of Food in the Last Year: Never true    Ran Out of Food in the Last Year: Never true  Transportation Needs: No Transportation Needs (10/15/2023)   PRAPARE - Administrator, Civil Service (Medical): No    Lack of Transportation  (Non-Medical): No  Physical Activity: Sufficiently Active (10/15/2023)   Exercise Vital Sign    Days of Exercise per Week: 4 days    Minutes of Exercise per Session: 60 min  Stress: Stress Concern Present (10/15/2023)   [employer] of Occupational Health - Occupational Stress Questionnaire    Feeling of Stress : Rather much  Social Connections: Moderately Integrated (10/15/2023)   Social Connection and Isolation Panel    Frequency of Communication with Friends and Family: More than three times a week    Frequency of Social Gatherings with Friends and Family: More than three times a week    Attends Religious Services: More than 4 times per year    Active Member of [employer] or Organizations: Yes    Attends Banker Meetings: More than 4 times per year    Marital Status: Widowed  Intimate Partner Violence: Not At Risk (10/15/2023)   Humiliation, Afraid, Rape, and Kick questionnaire    Fear of Current or Ex-Partner: No    Emotionally Abused: No    Physically Abused: No    Sexually Abused: No       Review of Systems  All other systems reviewed and are negative.      Objective:   Physical Exam Vitals reviewed.   Eyes:     General: No scleral icterus.       Right eye: No discharge.        Left eye: No discharge.     Conjunctiva/sclera: Conjunctivae normal.     Pupils: Pupils are equal, round, and reactive to light.    Cardiovascular:     Rate and Rhythm: Normal rate and regular rhythm.     Heart sounds: Normal heart sounds. No murmur heard. Pulmonary:  Effort: Pulmonary effort is normal. No respiratory distress.     Breath sounds: Normal breath sounds. No wheezing or rales.  Chest:     Chest wall: No tenderness.  Abdominal:     General: Bowel sounds are normal. There is no distension.     Palpations: Abdomen is soft. There is no mass.     Tenderness: There is no abdominal tenderness. There is no guarding or rebound.   Skin:    General: Skin is warm.      Findings: No erythema or rash.   Neurological:     Mental Status: She is alert and oriented to person, place, and time.     Cranial Nerves: No cranial nerve deficit.     Motor: No abnormal muscle tone.     Coordination: Coordination normal.     Deep Tendon Reflexes: Reflexes are normal and symmetric.    No bruit       Assessment & Plan:  Idiopathic peripheral neuropathy - Plan: Hemoglobin A1c, Vitamin B12, TSH, Comprehensive metabolic panel with GFR  Hypothyroidism, unspecified type - Plan: levothyroxine  (SYNTHROID ) 125 MCG tablet, Hemoglobin A1c, Vitamin B12, TSH, Comprehensive metabolic panel with GFR I believe the patient is developing peripheral neuropathy.  Check a TSH B12, and an A1c.  Meanwhile use gabapentin 300 mg p.o. every 8 hours as needed nerve pain.  Discontinue trazodone  at night and use gabapentin at night instead.  If this helps her other pain syndrome, we may be able to reduce her oxycodone .  We discussed stopping her Eliquis .  Her recurrent DVTs were in her upper extremities due to TPN.  This occurred years ago.  I feel the patient can safely discontinue Eliquis  at this point.  She is also going to discuss this with her heart doctor

## 2023-12-19 ENCOUNTER — Ambulatory Visit: Payer: Self-pay | Admitting: Family Medicine

## 2023-12-19 LAB — COMPREHENSIVE METABOLIC PANEL WITH GFR
AG Ratio: 1.5 (calc) (ref 1.0–2.5)
ALT: 8 U/L (ref 6–29)
AST: 8 U/L — ABNORMAL LOW (ref 10–35)
Albumin: 4.1 g/dL (ref 3.6–5.1)
Alkaline phosphatase (APISO): 90 U/L (ref 37–153)
BUN/Creatinine Ratio: 13 (calc) (ref 6–22)
BUN: 19 mg/dL (ref 7–25)
CO2: 26 mmol/L (ref 20–32)
Calcium: 9.4 mg/dL (ref 8.6–10.4)
Chloride: 104 mmol/L (ref 98–110)
Creat: 1.42 mg/dL — ABNORMAL HIGH (ref 0.60–1.00)
Globulin: 2.8 g/dL (ref 1.9–3.7)
Glucose, Bld: 142 mg/dL — ABNORMAL HIGH (ref 65–99)
Potassium: 3.7 mmol/L (ref 3.5–5.3)
Sodium: 138 mmol/L (ref 135–146)
Total Bilirubin: 0.7 mg/dL (ref 0.2–1.2)
Total Protein: 6.9 g/dL (ref 6.1–8.1)
eGFR: 39 mL/min/{1.73_m2} — ABNORMAL LOW (ref >=60)

## 2023-12-19 LAB — HEMOGLOBIN A1C
Hgb A1c MFr Bld: 6 % — ABNORMAL HIGH (ref <5.7)
Mean Plasma Glucose: 126 mg/dL
eAG (mmol/L): 7 mmol/L

## 2023-12-19 LAB — TSH: TSH: 0.89 m[IU]/L (ref 0.40–4.50)

## 2023-12-19 LAB — VITAMIN B12: Vitamin B-12: 358 pg/mL (ref 200–1100)

## 2023-12-30 ENCOUNTER — Telehealth: Payer: Self-pay | Admitting: Family Medicine

## 2023-12-30 NOTE — Telephone Encounter (Signed)
 Copied from CRM [id number]. Topic: Clinical - Medication Refill >> Dec 30, 2023 10:56 AM Zebedee SAUNDERS wrote: Medication: oxyCODONE  (OXY IR/ROXICODONE ) 5 MG immediate release tablet  Has the patient contacted their pharmacy? Yes (Agent: If no, request that the patient contact the pharmacy for the refill. If patient does not wish to contact the pharmacy document the reason why and proceed with request.) (Agent: If yes, when and what did the pharmacy advise?)  This is the patient's preferred pharmacy:  [facility] - [place], [place] - [address] [place] [address] Phone: [phone] Fax: [phone]  Is this the correct pharmacy for this prescription? Yes If no, delete pharmacy and type the correct one.   Has the prescription been filled recently? Yes  Is the patient out of the medication? Yes  Has the patient been seen for an appointment in the last year OR does the patient have an upcoming appointment? Yes  Can we respond through MyChart? Yes  Agent: Please be advised that Rx refills may take up to 3 business days. We ask that you follow-up with your pharmacy.

## 2023-12-31 NOTE — Telephone Encounter (Signed)
 Requested medication (s) are due for refill today: Yes  Requested medication (s) are on the active medication list: Yes  Last refill:  12/01/23  Future visit scheduled: No  Notes to clinic:  Unable to refill per protocol, cannot delegate.      Requested Prescriptions  Pending Prescriptions Disp Refills   oxyCODONE  (OXY IR/ROXICODONE ) 5 MG immediate release tablet 120 tablet 0    Sig: Take 1 tablet (5 mg total) by mouth every 6 (six) hours as needed for severe pain (pain score 7-10).     Not Delegated - Analgesics:  Opioid Agonists Failed - 12/31/2023 10:04 PM      Failed - This refill cannot be delegated      Failed - Urine Drug Screen completed in last 360 days      Passed - Valid encounter within last 3 months    Recent Outpatient Visits           1 week ago Idiopathic peripheral neuropathy   [place] [facility] Duanne, Butler DASEN, MD   4 months ago Influenza A   [place] [facility] Duanne Butler DASEN, [place]   5 months ago Essential hypertension   [place] [facility] Duanne, Butler DASEN, [place]   6 months ago Encounter for screening mammogram for malignant neoplasm of breast   [place] [facility] Duanne Butler DASEN, MD   9 months ago Urinary tract infection with hematuria, site unspecified   [place] [facility] Kayla Jeoffrey RAMAN, [place]

## 2024-01-02 MED ORDER — OXYCODONE HCL 5 MG PO TABS: 5.0000 mg | ORAL_TABLET | Freq: Four times a day (QID) | ORAL | 0 refills | Status: DC | PRN

## 2024-01-07 DIAGNOSIS — Z936 Other artificial openings of urinary tract status: Secondary | ICD-10-CM | POA: Diagnosis not present

## 2024-01-26 ENCOUNTER — Encounter: Payer: Self-pay | Admitting: *Deleted

## 2024-01-30 ENCOUNTER — Other Ambulatory Visit: Payer: Self-pay | Admitting: Family Medicine

## 2024-01-30 NOTE — Telephone Encounter (Signed)
 Requested medications are due for refill today.  yes  Requested medications are on the active medications list.  yes  Last refill. 01/02/2024 #120 0 rf  Future visit scheduled.   no  Notes to clinic.  Refill not delegated.    Requested Prescriptions  Pending Prescriptions Disp Refills   oxyCODONE  (OXY IR/ROXICODONE ) 5 MG immediate release tablet 120 tablet 0    Sig: Take 1 tablet (5 mg total) by mouth every 6 (six) hours as needed for severe pain (pain score 7-10).     Not Delegated - Analgesics:  Opioid Agonists Failed - 01/30/2024  5:55 PM      Failed - This refill cannot be delegated      Failed - Urine Drug Screen completed in last 360 days      Failed - Valid encounter within last 3 months    Recent Outpatient Visits           1 month ago Idiopathic peripheral neuropathy   [place] [facility] Duanne, Butler DASEN, MD   5 months ago Influenza A   [place] [facility] Duanne Butler DASEN, MD   6 months ago Essential hypertension   [place] [facility] Duanne, Butler DASEN, MD   7 months ago Encounter for screening mammogram for malignant neoplasm of breast   [place] [facility] Duanne Butler DASEN, MD   10 months ago Urinary tract infection with hematuria, site unspecified    [facility] Kayla Jeoffrey RAMAN, [place]

## 2024-01-30 NOTE — Telephone Encounter (Signed)
 Copied from CRM [id number]. Topic: Clinical - Medication Refill >> Jan 30, 2024  2:04 PM Marissa P wrote: Medication: oxyCODONE  (OXY IR/ROXICODONE ) 5 MG immediate release tablet  Has the patient contacted their pharmacy? No (Agent: If no, request that the patient contact the pharmacy for the refill. If patient does not wish to contact the pharmacy document the reason why and proceed with request.) (Agent: If yes, when and what did the pharmacy advise?)  This is the patient's preferred pharmacy:  [facility] - [place], [place] - [address] [place] [address] Phone: [phone] Fax: [phone]    Is this the correct pharmacy for this prescription? Yes If no, delete pharmacy and type the correct one.   Has the prescription been filled recently? Yes  Is the patient out of the medication? No  Has the patient been seen for an appointment in the last year OR does the patient have an upcoming appointment? Yes  Can we respond through MyChart? Yes  Agent: Please be advised that Rx refills may take up to 3 business days. We ask that you follow-up with your pharmacy.

## 2024-02-02 MED ORDER — OXYCODONE HCL 5 MG PO TABS: 5.0000 mg | ORAL_TABLET | Freq: Four times a day (QID) | ORAL | 0 refills | Status: DC | PRN

## 2024-02-03 ENCOUNTER — Ambulatory Visit: Admitting: Family Medicine

## 2024-02-03 ENCOUNTER — Encounter: Payer: Self-pay | Admitting: Family Medicine

## 2024-02-03 ENCOUNTER — Ambulatory Visit (HOSPITAL_COMMUNITY)
Admission: RE | Admit: 2024-02-03 | Discharge: 2024-02-03 | Disposition: A | Discharge status: Home / Self Care | Source: Ambulatory Visit | Attending: Family Medicine | Admitting: Family Medicine

## 2024-02-03 ENCOUNTER — Ambulatory Visit: Payer: Self-pay

## 2024-02-03 VITALS — BP 122/82 | HR 60 | Temp 98.4°F | Ht 61.0 in | Wt 202.4 lb

## 2024-02-03 DIAGNOSIS — R600 Localized edema: Secondary | ICD-10-CM | POA: Insufficient documentation

## 2024-02-03 NOTE — Telephone Encounter (Signed)
 ED precautions reviewed, pt verbalized understanding.   FYI Only or Action Required?: FYI only for provider.  Patient was last seen in primary care on 12/18/2023 by Duanne Butler DASEN, MD.  Called Nurse Triage reporting Leg Swelling.  Symptoms began several weeks ago.  Interventions attempted: Prescription medications: furosemide .  Symptoms are: gradually worsening.  Triage Disposition: See HCP Within 4 Hours (Or PCP Triage)  Patient/caregiver understands and will follow disposition?: Yes   Reason for Disposition  [1] Thigh, calf, or ankle swelling AND [2] bilateral AND [3] 1 side is more swollen  Answer Assessment - Initial Assessment Questions 1. ONSET: When did the swelling start? (e.g., minutes, hours, days)     2 weeks  2. LOCATION: What part of the leg is swollen?  Are both legs swollen or just one leg?     Bilateral, entire legs  3. SEVERITY: How bad is the swelling? (e.g., localized; mild, moderate, severe)     Denies pitting  4. REDNESS: Is there redness or signs of infection?     Denies  5. PAIN: Is the swelling painful to touch? If Yes, ask: How painful is it?   (Scale 1-10; mild, moderate or severe)     Bilateral foot tingling  6. FEVER: Do you have a fever? If Yes, ask: What is it, how was it measured, and when did it start?      Denies  7. CAUSE: What do you think is causing the leg swelling?     Unknown  8. MEDICAL HISTORY: Do you have a history of blood clots (e.g., DVT), cancer, heart failure, kidney disease, or liver failure?     CHF, states compliant with diuretic, upper ext DVT  9. RECURRENT SYMPTOM: Have you had leg swelling before? If Yes, ask: When was the last time? What happened that time?     Yes, but typically resolves quickly  10. OTHER SYMPTOMS: Do you have any other symptoms? (e.g., chest pain, difficulty breathing)       Denies  Answer Assessment - Initial Assessment Questions 1. MAIN CONCERN OR SYMPTOM:  What is your main concern right now? What's the main symptom you're worried about? (e.g., breathing difficulty, ankle swelling, weight gain).     Bilateral lower leg edema  2. ONSET: When did the  edema  start?     2 weeks  3. BREATHING DIFFICULTY: Are you having any difficulty breathing? If Yes, ask: How bad is it?  (e.g., none, mild, moderate, severe)  Is this worse than usual for you?     Denies  4. EDEMA - FOOT-LEG SWELLING: Do you have swelling of your ankles, feet or legs? If Yes, ask: How bad is the swelling? (e.g., localized; mild, moderate, severe)     Denies, does have tingling   9. DIURETICS: Are you currently taking water  pills? (e.g., furosemide  [Lasix ], hydrochlorothiazide [HCTZ], bumetanide [Bumex], metolazone [Zaroxolyn]) If Yes, ask: What medicine are you taking, and how often?  Any recent change in dose?     Furosemide  40 mg. Instructions states to take twice daily as needed for edema, patient has only been taking one. Advised to take as directed on the bottle and to take her morning dose. Advised to f/u on updated recommendations at today's visit.  Protocols used: Leg Swelling and Edema-A-AH, Heart Failure on Treatment Follow-up Call-A-AH

## 2024-02-03 NOTE — Addendum Note (Signed)
 Addended by: KAYLA JEOFFREY RAMAN on: 02/03/2024 02:39 PM   Modules accepted: Orders

## 2024-02-03 NOTE — Progress Notes (Signed)
 Subjective:  HPI: [name] is a 73 y.o. female presenting on 02/03/2024 for Acute Visit (Bilateral leg swelling )   HPI Patient is in today for BLE swelling. She recently visited her son in [place]  2 weeks ago by car and air. Was standing and cooking a lot of the time, mostly fried foods and sweets. She has PMH of CHF, HTN, DVTs. She has been taking Furosemide  BID vs her normal daily and has seen minimal improvement. Denies redness, warmth, or calf pain, no SOB, dyspnea with exertion or orthopnea. Is taking Eliquis  once daily. Has also tried elevating her legs and intermittent use of compression stockings.   Review of Systems  All other systems reviewed and are negative.   Relevant past medical history reviewed and updated as indicated.   Past Medical History:  Diagnosis Date   Acid reflux    Bowel obstruction (HCC) several   Recurrent SBO secondary to adhesions   Cancer [facility]) 2011 [facility])   diagnosed in 2009 per pt. Invasive High grade Urothelial carcinoma- s/p radiation and Chemo    Chronic back pain    Clotting disorder (HCC)    DVT (deep venous thrombosis) (HCC) 2015   Fatty liver    Gout    Hyperlipidemia    Hypertension    pt says taken off medication since has lost weight.   Hypothyroidism    Internal hemorrhoids    Leukopenia 12/06/2011   HIV serology negative   Malnutrition (HCC)    PE (pulmonary embolism)    [facility]   Rectal ulcer 2016   [facility]   UTI (lower urinary tract infection)    Recurrent     Past Surgical History:  Procedure Laterality Date   ABDOMINAL SURGERY     with intestinal puncture x 2, exploratory surgery    BACK SURGERY     X2   BLADDER REMOVAL     CHOLECYSTECTOMY     CYSTECTOMY     breast   HERNIA REPAIR     mesh   ILEO CONDUIT     For bladder cancer   PORTACATH PLACEMENT      Allergies and medications reviewed and updated.   Current Outpatient Medications:    Ascorbic Acid (VITAMIN C  PO), Take 1 tablet by mouth daily., Disp: , Rfl:    Casanthranol-Docusate Sodium  (STOOL SOFTENER PLUS PO), Take by mouth., Disp: , Rfl:    colestipol (COLESTID) 1 g tablet, Take by mouth., Disp: , Rfl:    Cyanocobalamin  1000 MCG CAPS, Take 1 tablet by mouth daily., Disp: 90 capsule, Rfl: 3   dexlansoprazole  (DEXILANT ) 60 MG capsule, TAKE 1 CAPSULE BY MOUTH DAILY, Disp: 100 capsule, Rfl: 2   diclofenac  Sodium (VOLTAREN ) 1 % GEL, APPLY TO AFFECTED AREAS 3 TIMES DAILY AS NEEDED., Disp: 100 g, Rfl: 0   ELIQUIS  5 MG TABS tablet, TAKE 1 TABLET BY MOUTH TWICE  DAILY, Disp: 200 tablet, Rfl: 2   Ergocalciferol (VITAMIN D2) 50 MCG (2000 UT) TABS, tAKE 1 Tablet daily, Disp: 30 tablet, Rfl:    famotidine  (PEPCID ) 20 MG tablet, Take 20 mg by mouth 2 (two) times daily., Disp: , Rfl:    furosemide  (LASIX ) 40 MG tablet, TAKE 1 TABLET BY MOUTH TWICE  DAILY AS NEEDED FOR EDEMA, Disp: 200 tablet, Rfl: 2   gabapentin  (NEURONTIN ) 300 MG capsule, Take 1 capsule (300 mg total) by mouth 3 (three) times daily as needed (nerve)., Disp: 90 capsule, Rfl: 3  levothyroxine  (SYNTHROID ) 125 MCG tablet, Take 1 tablet (125 mcg total) by mouth daily., Disp: 90 tablet, Rfl: 3   lidocaine  (XYLOCAINE ) 5 % ointment, APPLY TOPICALLY 4 TIMES  DAILY AS NEEDED, Disp: 354.4 g, Rfl: 0   linaclotide  (LINZESS ) 145 MCG CAPS capsule, take 1 capsule daily before breakfast., Disp: 90 capsule, Rfl: 1   losartan  (COZAAR ) 50 MG tablet, Take 1 tablet (50 mg total) by mouth daily., Disp: 90 tablet, Rfl: 3   magnesium  oxide (MAG-OX) 400 MG tablet, Take by mouth., Disp: , Rfl:    NON FORMULARY, Diltiazem 2%/Lidocaine5% compound Use 3 x rectally daily for 6 weeks to heal anal fissure, Disp: 30 g, Rfl: 1   oxyCODONE  (OXY IR/ROXICODONE ) 5 MG immediate release tablet, Take 1 tablet (5 mg total) by mouth every 6 (six) hours as needed for severe pain (pain score 7-10)., Disp: 120 tablet, Rfl: 0   potassium chloride  SA (KLOR-CON  M) 20 MEQ tablet, TAKE 1 TABLET  BY MOUTH DAILY, Disp: 100 tablet, Rfl: 2   pravastatin  (PRAVACHOL ) 20 MG tablet, Take 1 tablet (20 mg total) by mouth daily., Disp: 100 tablet, Rfl: 1   traZODone  (DESYREL ) 50 MG tablet, take 1 tablet (50 milligram total) by mouth at bedtime., Disp: 90 tablet, Rfl: 1  Allergies  Allergen Reactions   Codeine Hives   Sulfa Antibiotics Hives   Bactrim [Sulfamethoxazole-Trimethoprim] Itching    Reports severe itching.   Lactose Diarrhea   Nitrofuran Derivatives Itching   Penicillins Rash    Has patient had a PCN reaction causing immediate rash, facial/tongue/throat swelling, SOB or lightheadedness with hypotension: No Has patient had a PCN reaction causing severe rash involving mucus membranes or skin necrosis: No Has patient had a PCN reaction that required hospitalization: No Has patient had a PCN reaction occurring within the last 10 years: No If all of the above answers are NO, then may proceed with Cephalosporin use.  Childhood rash - no adult reactions known    Objective:   BP 122/82   Pulse 60   Temp 98.4 F (36.9 C)   Ht 5' 1 (1.549 m)   Wt 202 lb 6.4 oz (91.8 kg)   SpO2 97%   BMI 38.24 kg/m      02/03/2024   12:00 PM 12/18/2023    3:44 PM 10/15/2023   11:25 AM  Vitals with BMI  Height 5' 1 5' 1 5' 1  Weight 202 lbs 6 oz 197 lbs 6 oz 195 lbs  BMI 38.26 37.31 36.86  Systolic 122 124 875  Diastolic 82 84 85  Pulse 60 92      Physical Exam Vitals and nursing note reviewed.  Constitutional:      Appearance: Normal appearance. She is normal weight.  HENT:     Head: Normocephalic and atraumatic.  Cardiovascular:     Rate and Rhythm: Normal rate and regular rhythm.     Pulses: Normal pulses.     Heart sounds: Normal heart sounds.  Pulmonary:     Effort: Pulmonary effort is normal.     Breath sounds: Normal breath sounds.  Musculoskeletal:     Right lower leg: Edema present.     Left lower leg: Edema present.  Skin:    General: Skin is warm and dry.   Neurological:     General: No focal deficit present.     Mental Status: She is alert and oriented to person, place, and time. Mental status is at baseline.  Psychiatric:  Mood and Affect: Mood normal.        Behavior: Behavior normal.        Thought Content: Thought content normal.        Judgment: Judgment normal.     Assessment & Plan:  Bilateral lower extremity edema Assessment & Plan: CMP, BNP, and vas US  ordered. Concerning for DVT given history and recent travel. Continue BID lasix , elevation, compression. Follow up in 1 week with PCP if symptoms persist.   Orders: -     Brain natriuretic peptide -     Comprehensive metabolic panel with GFR -     US  Venous Img Lower Bilateral (DVT); Future     Follow up plan: Return in about 1 week (around 02/10/2024) for follow up with PCP as needed if symptoms persist.  Jeoffrey GORMAN Barrio, FNP

## 2024-02-03 NOTE — Assessment & Plan Note (Signed)
 CMP, BNP, and vas US  ordered. Concerning for DVT given history and recent travel. Continue BID lasix , elevation, compression. Follow up in 1 week with PCP if symptoms persist.

## 2024-02-04 ENCOUNTER — Ambulatory Visit: Payer: Self-pay | Admitting: Family Medicine

## 2024-02-04 LAB — COMPREHENSIVE METABOLIC PANEL WITH GFR
AG Ratio: 1.6 (calc) (ref 1.0–2.5)
ALT: 7 U/L (ref 6–29)
AST: 10 U/L (ref 10–35)
Albumin: 4.4 g/dL (ref 3.6–5.1)
Alkaline phosphatase (APISO): 90 U/L (ref 37–153)
BUN/Creatinine Ratio: 15 (calc) (ref 6–22)
BUN: 21 mg/dL (ref 7–25)
CO2: 25 mmol/L (ref 20–32)
Calcium: 9.3 mg/dL (ref 8.6–10.4)
Chloride: 104 mmol/L (ref 98–110)
Creat: 1.4 mg/dL — ABNORMAL HIGH (ref 0.60–1.00)
Globulin: 2.8 g/dL (ref 1.9–3.7)
Glucose, Bld: 121 mg/dL — ABNORMAL HIGH (ref 65–99)
Potassium: 3.9 mmol/L (ref 3.5–5.3)
Sodium: 139 mmol/L (ref 135–146)
Total Bilirubin: 0.6 mg/dL (ref 0.2–1.2)
Total Protein: 7.2 g/dL (ref 6.1–8.1)
eGFR: 40 mL/min/1.73m2 — ABNORMAL LOW (ref >=60)

## 2024-02-04 LAB — D-DIMER, QUANTITATIVE: D-Dimer, Quant: 0.46 ug{FEU}/mL (ref <0.50)

## 2024-02-04 LAB — BRAIN NATRIURETIC PEPTIDE: Brain Natriuretic Peptide: 16 pg/mL (ref <100)

## 2024-02-05 ENCOUNTER — Telehealth: Payer: Self-pay

## 2024-02-05 DIAGNOSIS — Z936 Other artificial openings of urinary tract status: Secondary | ICD-10-CM | POA: Diagnosis not present

## 2024-02-05 NOTE — Telephone Encounter (Signed)
 Copied from CRM [id number]. Topic: Clinical - Lab/Test Results >> Feb 05, 2024  9:39 AM Delon DASEN wrote: Reason for CRM: read message in [place], no questions

## 2024-02-10 ENCOUNTER — Encounter: Payer: Self-pay | Admitting: Family Medicine

## 2024-02-10 ENCOUNTER — Ambulatory Visit: Admitting: Family Medicine

## 2024-02-10 ENCOUNTER — Ambulatory Visit: Payer: Self-pay

## 2024-02-10 VITALS — BP 142/82 | HR 89 | Temp 97.5°F | Ht 61.0 in | Wt 203.4 lb

## 2024-02-10 DIAGNOSIS — R531 Weakness: Secondary | ICD-10-CM

## 2024-02-10 LAB — URINALYSIS, ROUTINE W REFLEX MICROSCOPIC
Bilirubin Urine: NEGATIVE
Glucose, UA: NEGATIVE
Hyaline Cast: NONE SEEN /LPF
Ketones, ur: NEGATIVE
Nitrite: POSITIVE — AB
Specific Gravity, Urine: 1.015 (ref 1.001–1.035)
pH: 6 (ref 5.0–8.0)

## 2024-02-10 LAB — MICROSCOPIC MESSAGE

## 2024-02-10 MED ORDER — CEPHALEXIN 500 MG PO CAPS: 500.0000 mg | ORAL_CAPSULE | Freq: Three times a day (TID) | ORAL | 0 refills | Status: DC

## 2024-02-10 NOTE — Telephone Encounter (Signed)
 FYI Only or Action Required?: Action required by provider: medication refill request.  Patient was last seen in primary care on 02/03/2024 by Charlene Jeoffrey RAMAN, FNP.  Called Nurse Triage reporting Urinary Frequency.  Symptoms began 3 days ago.  Interventions attempted: Nothing.  Symptoms are: unchanged.  Triage Disposition: See Physician Within 24 Hours  Patient/caregiver understands and will follow disposition?: No, wishes to speak with PCP  Copied from CRM [id number]. Topic: Clinical - Red Word Triage >> Feb 10, 2024  8:36 AM [name] wrote: Red Word that prompted transfer to Nurse Triage: Possible uti. Pt is experiencing fatigue,dizziness, frequent/ burning during urination. Reason for Disposition  Urinating more frequently than usual (i.e., frequency) OR new-onset of the feeling of an urgent need to urinate (i.e., urgency)  Answer Assessment - Initial Assessment Questions 1. SYMPTOM: What's the main symptom you're concerned about? (e.g., frequency, incontinence)     Urinary frequency and burning with urinary 2. ONSET: When did the  burning and frequency  start?     3 days 3. PAIN: Is there any pain? If Yes, ask: How bad is it? (Scale: 1-10; mild, moderate, severe)     8 4. CAUSE: What do you think is causing the symptoms?     UTI 5. OTHER SYMPTOMS: Do you have any other symptoms? (e.g., blood in urine, fever, flank pain, pain with urination)     Denies back pain. Pt states that she has been sweating. Denies N/V.   Pt declined appts, would like something called to pharm. Pt was advised numerous times that recommendation is that she be seen by provider.  Protocols used: Urinary Symptoms-A-AH

## 2024-02-10 NOTE — Progress Notes (Signed)
 +  Subjective:    Patient ID: [name], female    DOB: 12-17-1950, 73 y.o.   MRN: [id number]  Urinary Frequency  Associated symptoms include frequency.    Patient is a very pleasant 73 year old African-American female with a very complicated past medical history.  She states that in the late 90s around 1998 she had back surgery.  At that time she had a accidental perforation in her intestines during the back surgery that required surgical correction.  Then in 2012 she was found to have bladder cancer.  She underwent surgical treatment of her bladder cancer and suffered a perforation to her intestines as well.  Ultimately she developed a small bowel obstruction due to adhesions and was unable to eat.  She states she lost more than 100 pounds.  She ultimately was admitted to [facility] and underwent TPN.  She required TPN for 3 years from 2012 for 2015 due to inability to tolerate oral food because of bowel obstructions.  Ultimately she underwent surgery in 2015 after she developed a fistula at the stoma of her bladder after her bladder was removed due to bladder cancer.  During the surgery to correct her urostomy, the patient also went lysis of adhesions.  At that point the bowel obstruction resolved and the patient was able to start tolerating oral food again.  She is on Eliquis  due to recurrent DVTs in her upper extremities stemming from her long-term use of TPN.  She is on chronic pain medication.  Patient has terrible rectal pain.  She states that it feels like she is being torn in half every time she has a bowel movement.  She had pelvic floor muscular dysfunction due to the severity of the pain and she is working with a specialist in [place].  Prior to working with a specialist for pelvic floor dysfunction, the patient was having 30 bowel movements a day that were searing and tearing with pain.  She has seen numerous specialist who cannot find anything wrong however she continues to  have horrific pain with defecation.  However the specialist has been able to reduce her frequency of BMs to 5 or 6 a day which is made her quality of life much better.  She still takes MS Contin  twice a day and oxycodone  2-3 times a day due to the severity of pain.  A lot of her pain sounds neuropathic in nature versus also rectal fissures due to the end complete relaxation of the anal sphincter.    The above was copied from a previous visit as a summary of her PMH.    02/10/24 Wt Readings from Last 3 Encounters:  02/10/24 203 lb 6.4 oz (92.3 kg)  02/03/24 202 lb 6.4 oz (91.8 kg)  12/18/23 197 lb 6 oz (89.5 kg)   Patient states that she just feels weak.  She feels tired.  She states that she usually feels this way when she has a bladder infection.  She also complains of pain around her vagina and near her urethra.  Patient has a history of a urostomy and therefore she does not produce urine.  She denies any fevers or chills.  She does have some right-sided CVA tenderness.  She denies any nausea or vomiting.  She denies any cough or chest pain.  She denies any shortness of breath.  She is unable to speak towards any frequency or hesitancy because of her urostomy.  However, she has noticed an odor in the urine is cloudy.  Urinalysis today shows positive nitrates and +2 leukocyte esterase but unfortunately this is coming from an ostomy bag. Past Medical History:  Diagnosis Date   Acid reflux    Bowel obstruction (HCC) several   Recurrent SBO secondary to adhesions   Cancer [facility] 2011 [facility])   diagnosed in 2009 per pt. Invasive High grade Urothelial carcinoma- s/p radiation and Chemo    Chronic back pain    Clotting disorder (HCC)    DVT (deep venous thrombosis) (HCC) 2015   Fatty liver    Gout    Hyperlipidemia    Hypertension    pt says taken off medication since has lost weight.   Hypothyroidism    Internal hemorrhoids    Leukopenia 12/06/2011   HIV serology negative    Malnutrition (HCC)    PE (pulmonary embolism)    [facility]   Rectal ulcer 2016   [facility]   UTI (lower urinary tract infection)    Recurrent   Past Surgical History:  Procedure Laterality Date   ABDOMINAL SURGERY     with intestinal puncture x 2, exploratory surgery    BACK SURGERY     X2   BLADDER REMOVAL     CHOLECYSTECTOMY     CYSTECTOMY     breast   HERNIA REPAIR     mesh   ILEO CONDUIT     For bladder cancer   PORTACATH PLACEMENT     Current Outpatient Medications on File Prior to Visit  Medication Sig Dispense Refill   Ascorbic Acid (VITAMIN C PO) Take 1 tablet by mouth daily.     Casanthranol-Docusate Sodium  (STOOL SOFTENER PLUS PO) Take by mouth.     colestipol (COLESTID) 1 g tablet Take by mouth.     Cyanocobalamin  1000 MCG CAPS Take 1 tablet by mouth daily. 90 capsule 3   dexlansoprazole  (DEXILANT ) 60 MG capsule TAKE 1 CAPSULE BY MOUTH DAILY 100 capsule 2   diclofenac  Sodium (VOLTAREN ) 1 % GEL APPLY TO AFFECTED AREAS 3 TIMES DAILY AS NEEDED. 100 g 0   ELIQUIS  5 MG TABS tablet TAKE 1 TABLET BY MOUTH TWICE  DAILY 200 tablet 2   Ergocalciferol (VITAMIN D2) 50 MCG (2000 UT) TABS tAKE 1 Tablet daily 30 tablet    famotidine  (PEPCID ) 20 MG tablet Take 20 mg by mouth 2 (two) times daily.     furosemide  (LASIX ) 40 MG tablet TAKE 1 TABLET BY MOUTH TWICE  DAILY AS NEEDED FOR EDEMA 200 tablet 2   gabapentin  (NEURONTIN ) 300 MG capsule Take 1 capsule (300 mg total) by mouth 3 (three) times daily as needed (nerve). 90 capsule 3   levothyroxine  (SYNTHROID ) 125 MCG tablet Take 1 tablet (125 mcg total) by mouth daily. 90 tablet 3   lidocaine  (XYLOCAINE ) 5 % ointment APPLY TOPICALLY 4 TIMES  DAILY AS NEEDED 354.4 g 0   linaclotide  (LINZESS ) 145 MCG CAPS capsule take 1 capsule daily before breakfast. 90 capsule 1   losartan  (COZAAR ) 50 MG tablet Take 1 tablet (50 mg total) by mouth daily. 90 tablet 3   magnesium  oxide (MAG-OX) 400 MG tablet Take by mouth.     NON  FORMULARY Diltiazem 2%/Lidocaine5% compound Use 3 x rectally daily for 6 weeks to heal anal fissure 30 g 1   oxyCODONE  (OXY IR/ROXICODONE ) 5 MG immediate release tablet Take 1 tablet (5 mg total) by mouth every 6 (six) hours as needed for severe pain (pain score 7-10). 120 tablet 0   potassium chloride   SA (KLOR-CON  M) 20 MEQ tablet TAKE 1 TABLET BY MOUTH DAILY 100 tablet 2   pravastatin  (PRAVACHOL ) 20 MG tablet Take 1 tablet (20 mg total) by mouth daily. 100 tablet 1   traZODone  (DESYREL ) 50 MG tablet take 1 tablet (50 milligram total) by mouth at bedtime. 90 tablet 1   No current facility-administered medications on file prior to visit.   Allergies  Allergen Reactions   Codeine Hives   Sulfa Antibiotics Hives   Bactrim [Sulfamethoxazole-Trimethoprim] Itching    Reports severe itching.   Lactose Diarrhea   Nitrofuran Derivatives Itching   Penicillins Rash    Has patient had a PCN reaction causing immediate rash, facial/tongue/throat swelling, SOB or lightheadedness with hypotension: No Has patient had a PCN reaction causing severe rash involving mucus membranes or skin necrosis: No Has patient had a PCN reaction that required hospitalization: No Has patient had a PCN reaction occurring within the last 10 years: No If all of the above answers are NO, then may proceed with Cephalosporin use.  Childhood rash - no adult reactions known   Social History   Socioeconomic History   Marital status: Widowed    Spouse name: Not on file   Number of children: 2   Years of education: Not on file   Highest education level: Not on file  Occupational History    Employer: UNEMPLOYED  Tobacco Use   Smoking status: Former    Current packs/day: 0.00    Average packs/day: 0.6 packs/day for 48.0 years (28.8 ttl pk-yrs)    Types: Cigarettes    Start date: 10/03/1968    Quit date: 10/03/2016    Years since quitting: 7.3   Smokeless tobacco: Never  Vaping Use   Vaping status: Never Used  Substance  and Sexual Activity   Alcohol use: No   Drug use: No   Sexual activity: Yes    Birth control/protection: Post-menopausal  Other Topics Concern   Not on file  Social History Narrative   Not on file   Social Drivers of Health   Financial Resource Strain: Medium Risk (10/15/2023)   Overall Financial Resource Strain (CARDIA)    Difficulty of Paying Living Expenses: Somewhat hard  Food Insecurity: No Food Insecurity (10/15/2023)   Hunger Vital Sign    Worried About Running Out of Food in the Last Year: Never true    Ran Out of Food in the Last Year: Never true  Transportation Needs: No Transportation Needs (10/15/2023)   PRAPARE - Administrator, Civil Service (Medical): No    Lack of Transportation (Non-Medical): No  Physical Activity: Sufficiently Active (10/15/2023)   Exercise Vital Sign    Days of Exercise per Week: 4 days    Minutes of Exercise per Session: 60 min  Stress: Stress Concern Present (10/15/2023)   [employer] of Occupational Health - Occupational Stress Questionnaire    Feeling of Stress : Rather much  Social Connections: Moderately Integrated (10/15/2023)   Social Connection and Isolation Panel    Frequency of Communication with Friends and Family: More than three times a week    Frequency of Social Gatherings with Friends and Family: More than three times a week    Attends Religious Services: More than 4 times per year    Active Member of [employer] or Organizations: Yes    Attends Banker Meetings: More than 4 times per year    Marital Status: Widowed  Intimate Partner Violence: Not At Risk (10/15/2023)  Humiliation, Afraid, Rape, and Kick questionnaire    Fear of Current or Ex-Partner: No    Emotionally Abused: No    Physically Abused: No    Sexually Abused: No       Review of Systems  Genitourinary:  Positive for frequency.  All other systems reviewed and are negative.      Objective:   Physical Exam Vitals reviewed.   Eyes:     General: No scleral icterus.       Right eye: No discharge.        Left eye: No discharge.     Conjunctiva/sclera: Conjunctivae normal.     Pupils: Pupils are equal, round, and reactive to light.  Cardiovascular:     Rate and Rhythm: Normal rate and regular rhythm.     Heart sounds: Normal heart sounds. No murmur heard. Pulmonary:     Effort: Pulmonary effort is normal. No respiratory distress.     Breath sounds: Normal breath sounds. No wheezing or rales.  Chest:     Chest wall: No tenderness.  Abdominal:     General: Bowel sounds are normal. There is no distension.     Palpations: Abdomen is soft. There is no mass.     Tenderness: There is no abdominal tenderness. There is no guarding or rebound.  Skin:    General: Skin is warm.     Findings: No erythema or rash.  Neurological:     Mental Status: She is alert and oriented to person, place, and time.     Cranial Nerves: No cranial nerve deficit.     Motor: No abnormal muscle tone.     Coordination: Coordination normal.     Deep Tendon Reflexes: Reflexes are normal and symmetric.    No bruit       Assessment & Plan:  Weakness - Plan: Urinalysis, Routine w reflex microscopic, Urine Culture  There is no evidence of pneumonia or an acute abdominal infection today.  The patient has minimal leg swelling.  I suspect that she may have a urinary tract infection.  I will send a culture to drive therapy however I will start the patient empirically on Keflex  500 mg 3 times daily for 7 days

## 2024-02-11 LAB — URINE CULTURE
MICRO NUMBER:: 16819899
SPECIMEN QUALITY:: ADEQUATE

## 2024-02-12 ENCOUNTER — Ambulatory Visit: Payer: Self-pay | Admitting: Family Medicine

## 2024-02-20 ENCOUNTER — Other Ambulatory Visit: Payer: Self-pay | Admitting: Family Medicine

## 2024-02-20 ENCOUNTER — Encounter: Payer: Self-pay | Admitting: Radiology

## 2024-02-20 DIAGNOSIS — R070 Pain in throat: Secondary | ICD-10-CM | POA: Diagnosis not present

## 2024-02-20 DIAGNOSIS — J069 Acute upper respiratory infection, unspecified: Secondary | ICD-10-CM | POA: Diagnosis not present

## 2024-02-23 NOTE — Telephone Encounter (Signed)
 Rx 07/28/23 #100 2RF- too soon Requested Prescriptions  Pending Prescriptions Disp Refills   dexlansoprazole  (DEXILANT ) 60 MG capsule [Pharmacy Med Name: DEXLANSOPRAZOLE   60MG   CAP  DELAYED RELEASE] 30 capsule 11    Sig: TAKE 1 CAPSULE BY MOUTH DAILY     Gastroenterology: Proton Pump Inhibitors Passed - 02/23/2024 12:40 PM      Passed - Valid encounter within last 12 months    Recent Outpatient Visits           1 week ago Weakness   [place] [facility] Duanne Butler DASEN, MD   2 weeks ago Bilateral lower extremity edema   [place] [facility] Kayla Jeoffrey RAMAN, FNP   2 months ago Idiopathic peripheral neuropathy   [place] [facility] Duanne, Butler DASEN, MD   5 months ago Influenza A   [place] [facility] Duanne Butler DASEN, MD   6 months ago Essential hypertension   [place] [facility] [place], Butler DASEN, MD

## 2024-03-05 ENCOUNTER — Other Ambulatory Visit: Payer: Self-pay | Admitting: Family Medicine

## 2024-03-05 NOTE — Telephone Encounter (Unsigned)
 Copied from CRM [id number]. Topic: Clinical - Medication Refill >> Mar 05, 2024 12:04 PM Amy B wrote: Medication: oxyCODONE  (OXY IR/ROXICODONE ) 5 MG immediate release tablet  Has the patient contacted their pharmacy? No (Agent: If no, request that the patient contact the pharmacy for the refill. If patient does not wish to contact the pharmacy document the reason why and proceed with request.) (Agent: If yes, when and what did the pharmacy advise?)  This is the patient's preferred pharmacy:  [facility] [place], [place] - [address] [place] [address] Phone: [phone] Fax: [phone]  Is this the correct pharmacy for this prescription? Yes If no, delete pharmacy and type the correct one.   Has the prescription been filled recently? No  Is the patient out of the medication? Yes  Has the patient been seen for an appointment in the last year OR does the patient have an upcoming appointment? Yes  Can we respond through MyChart? Yes  Agent: Please be advised that Rx refills may take up to 3 business days. We ask that you follow-up with your pharmacy.

## 2024-03-08 ENCOUNTER — Other Ambulatory Visit: Payer: Self-pay | Admitting: Family Medicine

## 2024-03-08 ENCOUNTER — Telehealth: Payer: Self-pay

## 2024-03-08 MED ORDER — OXYCODONE HCL 5 MG PO TABS: 5.0000 mg | ORAL_TABLET | Freq: Four times a day (QID) | ORAL | 0 refills | Status: DC | PRN

## 2024-03-08 NOTE — Telephone Encounter (Signed)
 Copied from CRM [id number]. Topic: Clinical - Medication Question >> Mar 08, 2024  3:45 PM Dedra B wrote: Reason for CRM: Pt called in to follow up on oxycodone  refill request. Informed pt that med refills can take up to 3 business days.

## 2024-03-11 ENCOUNTER — Encounter: Payer: Self-pay | Admitting: Pharmacist

## 2024-03-11 NOTE — Progress Notes (Signed)
   03/11/2024  Patient ID: [name], female   DOB: Jun 24, 1951, 73 y.o.   MRN: [id number]  Pharmacy Quality Measure Review  This patient is appearing on a report for being at risk of failing the adherence measure for cholesterol (statin) medications this calendar year.   Medication: Pravastatin  20 mg  Last fill date: 03/07/24 for 30 day supply  Insurance report was not up to date. No action needed at this time.   Meds filled at [facility] will not do 90 day supplies.   [name], PharmD, BCACP Clinical Pharmacist [phone]

## 2024-03-15 DIAGNOSIS — K047 Periapical abscess without sinus: Secondary | ICD-10-CM | POA: Diagnosis not present

## 2024-03-16 DIAGNOSIS — I10 Essential (primary) hypertension: Secondary | ICD-10-CM | POA: Diagnosis not present

## 2024-03-16 DIAGNOSIS — I82A19 Acute embolism and thrombosis of unspecified axillary vein: Secondary | ICD-10-CM | POA: Diagnosis not present

## 2024-03-19 ENCOUNTER — Other Ambulatory Visit: Payer: Self-pay | Admitting: Family Medicine

## 2024-03-22 NOTE — Telephone Encounter (Signed)
 Requested Prescriptions  Pending Prescriptions Disp Refills   dexlansoprazole  (DEXILANT ) 60 MG capsule [Pharmacy Med Name: DEXLANSOPRAZOLE   60MG   CAP  DELAYED RELEASE] 90 capsule 0    Sig: TAKE 1 CAPSULE BY MOUTH DAILY     Gastroenterology: Proton Pump Inhibitors Passed - 03/22/2024 11:22 AM      Passed - Valid encounter within last 12 months    Recent Outpatient Visits           1 month ago Weakness   [place] [facility] Duanne Butler DASEN, MD   1 month ago Bilateral lower extremity edema   [place] [facility] Kayla Jeoffrey RAMAN, FNP   3 months ago Idiopathic peripheral neuropathy   [place] [facility] Duanne, Butler DASEN, MD   6 months ago Influenza A   [place] [facility] Duanne Butler DASEN, MD   7 months ago Essential hypertension   [place] [facility] Pickard, Butler DASEN, MD

## 2024-03-23 DIAGNOSIS — Z936 Other artificial openings of urinary tract status: Secondary | ICD-10-CM | POA: Diagnosis not present

## 2024-03-31 ENCOUNTER — Telehealth: Payer: Self-pay | Admitting: Family Medicine

## 2024-03-31 NOTE — Telephone Encounter (Unsigned)
 Copied from CRM #[id number]. Topic: Clinical - Medication Refill >> Mar 31, 2024 12:10 PM Amy B wrote: Medication: oxyCODONE  (OXY IR/ROXICODONE ) 5 MG immediate release tablet  Has the patient contacted their pharmacy? No (Agent: If no, request that the patient contact the pharmacy for the refill. If patient does not wish to contact the pharmacy document the reason why and proceed with request.) (Agent: If yes, when and what did the pharmacy advise?)  This is the patient's preferred pharmacy:  [facility] - [place], [place] - [address] [place] [address] Phone: [phone] Fax: [phone]  Is this the correct pharmacy for this prescription? Yes If no, delete pharmacy and type the correct one.   Has the prescription been filled recently? No  Is the patient out of the medication? No  Has the patient been seen for an appointment in the last year OR does the patient have an upcoming appointment? Yes  Can we respond through MyChart? Yes  Agent: Please be advised that Rx refills may take up to 3 business days. We ask that you follow-up with your pharmacy.

## 2024-04-02 ENCOUNTER — Other Ambulatory Visit: Payer: Self-pay | Admitting: Family Medicine

## 2024-04-05 ENCOUNTER — Other Ambulatory Visit: Payer: Self-pay | Admitting: Family Medicine

## 2024-04-05 DIAGNOSIS — E782 Mixed hyperlipidemia: Secondary | ICD-10-CM

## 2024-04-05 NOTE — Telephone Encounter (Signed)
 Requested medication (s) are due for refill today: yes  Requested medication (s) are on the active medication list: yes  Last refill:  03/08/24  Future visit scheduled: no  Notes to clinic:  Unable to refill per protocol, cannot delegate.      Requested Prescriptions  Pending Prescriptions Disp Refills   oxyCODONE  (OXY IR/ROXICODONE ) 5 MG immediate release tablet [Pharmacy Med Name: OXYCODONE  HCL (IR) 5 MG TABLET] 120 tablet 0    Sig: take 1 tablet (5 MILLIGRAM total) by mouth every 6 (six) hours as needed for severe pain (pain score 7-10).     Not Delegated - Analgesics:  Opioid Agonists Failed - 04/05/2024 10:23 AM      Failed - This refill cannot be delegated      Failed - Urine Drug Screen completed in last 360 days      Failed - Valid encounter within last 3 months    Recent Outpatient Visits           1 month ago Weakness   [place] [facility] Duanne Butler DASEN, MD   2 months ago Bilateral lower extremity edema   [place] [facility] Kayla Jeoffrey RAMAN, FNP   3 months ago Idiopathic peripheral neuropathy   [place] [facility] Duanne, Butler DASEN, MD   7 months ago Influenza A   [place] [facility] Duanne Butler DASEN, MD   8 months ago Essential hypertension   [place] [facility] Pickard, Butler DASEN, MD

## 2024-04-10 ENCOUNTER — Other Ambulatory Visit: Payer: Self-pay | Admitting: Family Medicine

## 2024-05-03 ENCOUNTER — Encounter: Payer: Self-pay | Admitting: Radiology

## 2024-05-03 ENCOUNTER — Other Ambulatory Visit: Payer: Self-pay | Admitting: Family Medicine

## 2024-05-04 ENCOUNTER — Other Ambulatory Visit: Payer: Self-pay | Admitting: Family Medicine

## 2024-05-04 NOTE — Telephone Encounter (Signed)
 Copied from CRM [id number]. Topic: Clinical - Medication Refill >> May 04, 2024 10:30 AM Leonette P wrote: Medication: Oxycodone  5 mg  Has the patient contacted their pharmacy? Yes (Agent: If no, request that the patient contact the pharmacy for the refill. If patient does not wish to contact the pharmacy document the reason why and proceed with request.) (Agent: If yes, when and what did the pharmacy advise?)  This is the patient's preferred pharmacy:  [facility] - [place], [place] - [address] [place] [address]  Phone: [phone] Fax: [phone]   Is this the correct pharmacy for this prescription? Yes If no, delete pharmacy and type the correct one.   Has the prescription been filled recently? Yes  Is the patient out of the medication? No  Has the patient been seen for an appointment in the last year OR does the patient have an upcoming appointment? Yes  Can we respond through MyChart? No  Agent: Please be advised that Rx refills may take up to 3 business days. We ask that you follow-up with your pharmacy.

## 2024-05-04 NOTE — Telephone Encounter (Signed)
 Patient requests medication to be sent to [facility].

## 2024-05-05 NOTE — Telephone Encounter (Signed)
 Requested medication (s) are due for refill today: Yes  Requested medication (s) are on the active medication list: Yes  Last refill:  04/05/24  Future visit scheduled: Yes  Notes to clinic:  Not delegated.    Requested Prescriptions  Pending Prescriptions Disp Refills   oxyCODONE  (OXY IR/ROXICODONE ) 5 MG immediate release tablet 120 tablet 0    Sig: Take 1 tablet (5 mg total) by mouth every 6 (six) hours as needed for severe pain (pain score 7-10).     Not Delegated - Analgesics:  Opioid Agonists Failed - 05/05/2024  3:40 PM      Failed - This refill cannot be delegated      Failed - Urine Drug Screen completed in last 360 days      Failed - Valid encounter within last 3 months    Recent Outpatient Visits           2 months ago Weakness   [place] [facility] Duanne Butler DASEN, MD   3 months ago Bilateral lower extremity edema   [place] [facility] Medicine Kayla Jeoffrey RAMAN, FNP   4 months ago Idiopathic peripheral neuropathy   [place] [facility] Duanne, Butler DASEN, MD   8 months ago Influenza A   [place] [facility] Duanne Butler DASEN, MD   9 months ago Essential hypertension   [place] [facility] Medicine Pickard, Butler DASEN, MD

## 2024-05-05 NOTE — Telephone Encounter (Signed)
 Requested medication (s) are due for refill today: yes  Requested medication (s) are on the active medication list: yes  Last refill:  04/05/24  Future visit scheduled: yes  Notes to clinic:  Unable to refill per protocol, cannot delegate.      Requested Prescriptions  Pending Prescriptions Disp Refills   oxyCODONE  (OXY IR/ROXICODONE ) 5 MG immediate release tablet 120 tablet 0    Sig: Take 1 tablet (5 mg total) by mouth every 6 (six) hours as needed for severe pain (pain score 7-10).     Not Delegated - Analgesics:  Opioid Agonists Failed - 05/05/2024  3:33 PM      Failed - This refill cannot be delegated      Failed - Urine Drug Screen completed in last 360 days      Failed - Valid encounter within last 3 months    Recent Outpatient Visits           2 months ago Weakness   [place] [facility] Duanne Butler DASEN, MD   3 months ago Bilateral lower extremity edema   [place] [facility] Kayla Jeoffrey RAMAN, FNP   4 months ago Idiopathic peripheral neuropathy   [place] [facility] Duanne, Butler DASEN, MD   8 months ago Influenza A   [place] [facility] Medicine Duanne Butler DASEN, MD   9 months ago Essential hypertension   [place] [facility] Pickard, Butler DASEN, MD

## 2024-05-07 MED ORDER — OXYCODONE HCL 5 MG PO TABS: 5.0000 mg | ORAL_TABLET | Freq: Four times a day (QID) | ORAL | 0 refills | Status: DC | PRN

## 2024-05-11 ENCOUNTER — Ambulatory Visit (INDEPENDENT_AMBULATORY_CARE_PROVIDER_SITE_OTHER): Admitting: Family Medicine

## 2024-05-11 ENCOUNTER — Encounter: Payer: Self-pay | Admitting: Family Medicine

## 2024-05-11 VITALS — BP 124/64 | HR 85 | Temp 97.7°F | Ht 61.0 in | Wt 206.0 lb

## 2024-05-11 DIAGNOSIS — I808 Phlebitis and thrombophlebitis of other sites: Secondary | ICD-10-CM

## 2024-05-11 MED ORDER — CYCLOBENZAPRINE HCL 10 MG PO TABS: 10.0000 mg | ORAL_TABLET | Freq: Three times a day (TID) | ORAL | 0 refills | Status: AC | PRN

## 2024-05-11 NOTE — Progress Notes (Signed)
 +  Subjective:    Patient ID: [name], female    DOB: 26-Jul-1950, 73 y.o.   MRN: [id number] Patient recently discontinued Eliquis .  She has a history of DVT.  This is occurred years ago when she was on TPN.  She believes it occurred in her right arm.  However she is no longer on TPN so her physicians recommended stopping Eliquis .  Shortly after stopping Eliquis , the patient states that the vein visible in her antecubital fossa on the right side became swollen and tender.  Today it is slightly swollen.  It is extremely tender to the touch.  There is no swelling in the right upper arm.  There is no erythema or warmth however the tenderness to palpation in the vein is out of proportion to exam.  It is visibly more swollen than the vein in the left antecubital fossa.  She denies any recent lab draws. Past Medical History:  Diagnosis Date   Acid reflux    Bowel obstruction (HCC) several   Recurrent SBO secondary to adhesions   Cancer [facility]) 2011 [facility] X[facility])   diagnosed in 2009 per pt. Invasive High grade Urothelial carcinoma- s/p radiation and Chemo    Chronic back pain    Clotting disorder    DVT (deep venous thrombosis) (HCC) 2015   Fatty liver    Gout    Hyperlipidemia    Hypertension    pt says taken off medication since has lost weight.   Hypothyroidism    Internal hemorrhoids    Leukopenia 12/06/2011   HIV serology negative   Malnutrition    PE (pulmonary embolism)    [facility] records   Rectal ulcer 2016   [facility]   UTI (lower urinary tract infection)    Recurrent   Past Surgical History:  Procedure Laterality Date   ABDOMINAL SURGERY     with intestinal puncture x 2, exploratory surgery    BACK SURGERY     X2   BLADDER REMOVAL     CHOLECYSTECTOMY     CYSTECTOMY     breast   HERNIA REPAIR     mesh   ILEO CONDUIT     For bladder cancer   PORTACATH PLACEMENT     Current Outpatient Medications on File Prior to Visit  Medication Sig Dispense  Refill   Ascorbic Acid (VITAMIN C PO) Take 1 tablet by mouth daily.     Casanthranol-Docusate Sodium  (STOOL SOFTENER PLUS PO) Take by mouth.     cephALEXin  (KEFLEX ) 500 MG capsule Take 1 capsule (500 mg total) by mouth 3 (three) times daily. 21 capsule 0   colestipol (COLESTID) 1 g tablet Take by mouth.     Cyanocobalamin  1000 MCG CAPS Take 1 tablet by mouth daily. 90 capsule 3   dexlansoprazole  (DEXILANT ) 60 MG capsule TAKE 1 CAPSULE BY MOUTH DAILY 30 capsule 11   diclofenac  Sodium (VOLTAREN ) 1 % GEL APPLY TO AFFECTED AREAS 3 TIMES DAILY AS NEEDED. 100 g 0   ELIQUIS  5 MG TABS tablet TAKE 1 TABLET BY MOUTH TWICE  DAILY 200 tablet 2   Ergocalciferol (VITAMIN D2) 50 MCG (2000 UT) TABS tAKE 1 Tablet daily 30 tablet    famotidine  (PEPCID ) 20 MG tablet Take 20 mg by mouth 2 (two) times daily.     furosemide  (LASIX ) 40 MG tablet TAKE 1 TABLET BY MOUTH TWICE  DAILY AS NEEDED FOR EDEMA 200 tablet 2   gabapentin  (NEURONTIN ) 300 MG capsule take 1 capsule (  300 MILLIGRAM total) by mouth 3 (three) times daily as needed (nerve). 90 capsule 1   levothyroxine  (SYNTHROID ) 125 MCG tablet Take 1 tablet (125 mcg total) by mouth daily. 90 tablet 3   lidocaine  (XYLOCAINE ) 5 % ointment APPLY TOPICALLY 4 TIMES  DAILY AS NEEDED 354.4 g 0   linaclotide  (LINZESS ) 145 MCG CAPS capsule take 1 capsule daily before breakfast. 90 capsule 1   losartan  (COZAAR ) 50 MG tablet Take 1 tablet (50 mg total) by mouth daily. 90 tablet 3   magnesium  oxide (MAG-OX) 400 MG tablet Take by mouth.     NON FORMULARY Diltiazem 2%/Lidocaine5% compound Use 3 x rectally daily for 6 weeks to heal anal fissure 30 g 1   oxyCODONE  (OXY IR/ROXICODONE ) 5 MG immediate release tablet Take 1 tablet (5 mg total) by mouth every 6 (six) hours as needed for severe pain (pain score 7-10). 120 tablet 0   potassium chloride  SA (KLOR-CON  M) 20 MEQ tablet TAKE 1 TABLET BY MOUTH DAILY 100 tablet 2   pravastatin  (PRAVACHOL ) 20 MG tablet take 1 tablet (20 MILLIGRAM  total) by mouth daily. 100 tablet 0   traZODone  (DESYREL ) 50 MG tablet take 1 tablet (50 milligram total) by mouth at bedtime. 90 tablet 1   No current facility-administered medications on file prior to visit.   Allergies  Allergen Reactions   Codeine Hives   Sulfa Antibiotics Hives   Bactrim [Sulfamethoxazole-Trimethoprim] Itching    Reports severe itching.   Lactose Diarrhea   Nitrofuran Derivatives Itching   Penicillins Rash    Has patient had a PCN reaction causing immediate rash, facial/tongue/throat swelling, SOB or lightheadedness with hypotension: No Has patient had a PCN reaction causing severe rash involving mucus membranes or skin necrosis: No Has patient had a PCN reaction that required hospitalization: No Has patient had a PCN reaction occurring within the last 10 years: No If all of the above answers are NO, then may proceed with Cephalosporin use.  Childhood rash - no adult reactions known   Social History   Socioeconomic History   Marital status: Widowed    Spouse name: Not on file   Number of children: 2   Years of education: Not on file   Highest education level: Not on file  Occupational History    Employer: UNEMPLOYED  Tobacco Use   Smoking status: Former    Current packs/day: 0.00    Average packs/day: 0.6 packs/day for 48.0 years (28.8 ttl pk-yrs)    Types: Cigarettes    Start date: 10/03/1968    Quit date: 10/03/2016    Years since quitting: 7.6   Smokeless tobacco: Never  Vaping Use   Vaping status: Never Used  Substance and Sexual Activity   Alcohol use: No   Drug use: No   Sexual activity: Yes    Birth control/protection: Post-menopausal  Other Topics Concern   Not on file  Social History Narrative   Not on file   Social Drivers of Health   Financial Resource Strain: Medium Risk (10/15/2023)   Overall Financial Resource Strain (CARDIA)    Difficulty of Paying Living Expenses: Somewhat hard  Food Insecurity: No Food Insecurity  (10/15/2023)   Hunger Vital Sign    Worried About Running Out of Food in the Last Year: Never true    Ran Out of Food in the Last Year: Never true  Transportation Needs: No Transportation Needs (10/15/2023)   PRAPARE - Administrator, Civil Service (Medical): No  Lack of Transportation (Non-Medical): No  Physical Activity: Sufficiently Active (10/15/2023)   Exercise Vital Sign    Days of Exercise per Week: 4 days    Minutes of Exercise per Session: 60 min  Stress: Stress Concern Present (10/15/2023)   [employer] of Occupational Health - Occupational Stress Questionnaire    Feeling of Stress : Rather much  Social Connections: Moderately Integrated (10/15/2023)   Social Connection and Isolation Panel    Frequency of Communication with Friends and Family: More than three times a week    Frequency of Social Gatherings with Friends and Family: More than three times a week    Attends Religious Services: More than 4 times per year    Active Member of [employer] or Organizations: Yes    Attends Banker Meetings: More than 4 times per year    Marital Status: Widowed  Intimate Partner Violence: Not At Risk (10/15/2023)   Humiliation, Afraid, Rape, and Kick questionnaire    Fear of Current or Ex-Partner: No    Emotionally Abused: No    Physically Abused: No    Sexually Abused: No       Review of Systems  Genitourinary:  Positive for frequency.  All other systems reviewed and are negative.      Objective:   Physical Exam Vitals reviewed.  Eyes:     General: No scleral icterus.       Right eye: No discharge.        Left eye: No discharge.     Conjunctiva/sclera: Conjunctivae normal.     Pupils: Pupils are equal, round, and reactive to light.  Cardiovascular:     Rate and Rhythm: Normal rate and regular rhythm.     Heart sounds: Normal heart sounds. No murmur heard. Pulmonary:     Effort: Pulmonary effort is normal. No respiratory distress.     Breath  sounds: Normal breath sounds. No wheezing or rales.  Chest:     Chest wall: No tenderness.  Abdominal:     General: Bowel sounds are normal. There is no distension.     Palpations: Abdomen is soft. There is no mass.     Tenderness: There is no abdominal tenderness. There is no guarding or rebound.  Musculoskeletal:     Right upper arm: Tenderness present. No swelling, edema, deformity or bony tenderness.     Left upper arm: No swelling or edema.       Arms:  Skin:    General: Skin is warm.     Findings: No erythema or rash.  Neurological:     Mental Status: She is alert and oriented to person, place, and time.     Cranial Nerves: No cranial nerve deficit.     Motor: No abnormal muscle tone.     Coordination: Coordination normal.     Deep Tendon Reflexes: Reflexes are normal and symmetric.           Assessment & Plan:  Thrombophlebitis of right arm - Plan: US  Venous Img Upper Uni Right(DVT)  Patient is concerned about a DVT in her right arm.  I suspect that this is thrombophlebitis.  I recommended warm compresses to be applied to the area 3-4 times a day.  She can also use Voltaren  gel 2 g applied to the area 4 times daily because of her chronic kidney disease.  Meanwhile I will get an ultrasound of her right upper extremity to rule out a DVT.  If there is a DVT present  I would recommend resuming Eliquis  indefinitely as this would be unprovoked

## 2024-05-16 ENCOUNTER — Other Ambulatory Visit: Payer: Self-pay

## 2024-05-16 ENCOUNTER — Emergency Department (HOSPITAL_COMMUNITY)
Admission: EM | Admit: 2024-05-16 | Discharge: 2024-05-16 | Disposition: A | Discharge status: Home / Self Care | Attending: Emergency Medicine | Admitting: Emergency Medicine

## 2024-05-16 ENCOUNTER — Emergency Department (HOSPITAL_COMMUNITY)

## 2024-05-16 DIAGNOSIS — R197 Diarrhea, unspecified: Secondary | ICD-10-CM | POA: Insufficient documentation

## 2024-05-16 DIAGNOSIS — Z7901 Long term (current) use of anticoagulants: Secondary | ICD-10-CM | POA: Diagnosis not present

## 2024-05-16 DIAGNOSIS — M542 Cervicalgia: Secondary | ICD-10-CM | POA: Insufficient documentation

## 2024-05-16 LAB — CBC
HCT: 35.5 % — ABNORMAL LOW (ref 36.0–46.0)
Hemoglobin: 11.6 g/dL — ABNORMAL LOW (ref 12.0–15.0)
MCH: 25.3 pg — ABNORMAL LOW (ref 26.0–34.0)
MCHC: 32.7 g/dL (ref 30.0–36.0)
MCV: 77.5 fL — ABNORMAL LOW (ref 80.0–100.0)
Platelets: 347 K/uL (ref 150–400)
RBC: 4.58 MIL/uL (ref 3.87–5.11)
RDW: 15.5 % (ref 11.5–15.5)
WBC: 9.8 K/uL (ref 4.0–10.5)
nRBC: 0 % (ref 0.0–0.2)

## 2024-05-16 LAB — C DIFFICILE QUICK SCREEN W PCR REFLEX
C Diff antigen: POSITIVE — AB
C Diff toxin: NEGATIVE

## 2024-05-16 LAB — CLOSTRIDIUM DIFFICILE BY PCR, REFLEXED
Hypervirulent Strain: NEGATIVE
Toxigenic C. Difficile by PCR: NEGATIVE

## 2024-05-16 LAB — COMPREHENSIVE METABOLIC PANEL WITH GFR
ALT: 6 U/L (ref 0–44)
AST: 12 U/L — ABNORMAL LOW (ref 15–41)
Albumin: 3.8 g/dL (ref 3.5–5.0)
Alkaline Phosphatase: 87 U/L (ref 38–126)
Anion gap: 11 (ref 5–15)
BUN: 12 mg/dL (ref 8–23)
CO2: 23 mmol/L (ref 22–32)
Calcium: 8.9 mg/dL (ref 8.9–10.3)
Chloride: 102 mmol/L (ref 98–111)
Creatinine, Ser: 1.25 mg/dL — ABNORMAL HIGH (ref 0.44–1.00)
GFR, Estimated: 46 mL/min — ABNORMAL LOW (ref >60)
Glucose, Bld: 147 mg/dL — ABNORMAL HIGH (ref 70–99)
Potassium: 3.6 mmol/L (ref 3.5–5.1)
Sodium: 136 mmol/L (ref 135–145)
Total Bilirubin: 0.6 mg/dL (ref 0.0–1.2)
Total Protein: 7 g/dL (ref 6.5–8.1)

## 2024-05-16 LAB — URINALYSIS, ROUTINE W REFLEX MICROSCOPIC
Bilirubin Urine: NEGATIVE
Glucose, UA: NEGATIVE mg/dL
Ketones, ur: NEGATIVE mg/dL
Nitrite: POSITIVE — AB
Protein, ur: 30 mg/dL — AB
Specific Gravity, Urine: 1.013 (ref 1.005–1.030)
WBC, UA: >50 WBC/hpf (ref 0–5)
pH: 5 (ref 5.0–8.0)

## 2024-05-16 LAB — LIPASE, BLOOD: Lipase: 13 U/L (ref 11–51)

## 2024-05-16 MED ORDER — ONDANSETRON HCL 4 MG/2ML IJ SOLN
4.0000 mg | Freq: Once | INTRAMUSCULAR | Status: AC
  Administered 2024-05-16: 4 mg via INTRAVENOUS
  Filled 2024-05-16: qty 2

## 2024-05-16 MED ORDER — MORPHINE SULFATE (PF) 2 MG/ML IV SOLN
2.0000 mg | Freq: Once | INTRAVENOUS | Status: AC
  Administered 2024-05-16: 2 mg via INTRAVENOUS
  Filled 2024-05-16: qty 1

## 2024-05-16 MED ORDER — SODIUM CHLORIDE 0.9 % IV BOLUS
500.0000 mL | Freq: Once | INTRAVENOUS | Status: AC
  Administered 2024-05-16: 500 mL via INTRAVENOUS

## 2024-05-16 MED ORDER — OXYCODONE HCL 5 MG PO TABS
5.0000 mg | ORAL_TABLET | Freq: Once | ORAL | Status: AC
  Administered 2024-05-16: 5 mg via ORAL
  Filled 2024-05-16: qty 1

## 2024-05-16 MED ORDER — LOPERAMIDE HCL 2 MG PO CAPS
4.0000 mg | ORAL_CAPSULE | Freq: Once | ORAL | Status: AC
  Administered 2024-05-16: 4 mg via ORAL
  Filled 2024-05-16: qty 2

## 2024-05-16 MED ORDER — IOHEXOL 300 MG/ML  SOLN
75.0000 mL | Freq: Once | INTRAMUSCULAR | Status: AC | PRN
  Administered 2024-05-16: 75 mL via INTRAVENOUS

## 2024-05-16 NOTE — Discharge Instructions (Addendum)
 You have test pending for GI infection.  You may need to be treated with more medication.  Call your primary care physician tomorrow to schedule appointment to be seen for further evaluation.  You can take a dose of Imodium at home if you have further diarrhea.  Take your medication as directed

## 2024-05-16 NOTE — ED Notes (Signed)
 Attempting PO Challenge

## 2024-05-16 NOTE — ED Provider Notes (Signed)
 [place] EMERGENCY DEPARTMENT AT [facility] Provider Note   CSN: [id number] Arrival date & time: 05/16/24  1144     Patient presents with: Neck Pain and Abdominal Pain   [name] is a 73 y.o. female.   Patient complains of multiple episodes of diarrhea.  Patient states that she also has soreness in the front of her neck.  Patient reports that she had a tooth ache several weeks ago.  Patient reports that she was taking amoxicillin for a possible dental infection.  Patient reports that she has continued to take on and off.  Patient reports that she has pain in the front of her neck that goes to her ears.  Patient states that she thinks the pain is coming from a tooth.  Patient reports she has been to the bathroom multiple times today.  Patient states that she has not had any vomiting.  Patient reports she has experienced some nausea.   Neck Pain Abdominal Pain Associated symptoms: diarrhea        Prior to Admission medications   Medication Sig Start Date End Date Taking? Authorizing Provider  Ascorbic Acid (VITAMIN C PO) Take 1 tablet by mouth daily.    [provider]  Casanthranol-Docusate Sodium  (STOOL SOFTENER PLUS PO) Take by mouth.    [provider]  cephALEXin  (KEFLEX ) 500 MG capsule Take 1 capsule (500 mg total) by mouth 3 (three) times daily. 02/10/24   Duanne Butler DASEN, MD  colestipol (COLESTID) 1 g tablet Take by mouth. 05/14/18   [provider]  Cyanocobalamin  1000 MCG CAPS Take 1 tablet by mouth daily. 08/30/20   Bari Theodoro FALCON, MD  cyclobenzaprine  (FLEXERIL ) 10 MG tablet Take 1 tablet (10 mg total) by mouth 3 (three) times daily as needed for muscle spasms. 05/11/24   Duanne Butler DASEN, MD  dexlansoprazole  (DEXILANT ) 60 MG capsule TAKE 1 CAPSULE BY MOUTH DAILY 04/12/24   Duanne Butler DASEN, MD  diclofenac  Sodium (VOLTAREN ) 1 % GEL APPLY TO AFFECTED AREAS 3 TIMES DAILY AS NEEDED. 01/24/20   Bari Theodoro FALCON, MD  ELIQUIS   5 MG TABS tablet TAKE 1 TABLET BY MOUTH TWICE  DAILY 10/29/23   Duanne Butler DASEN, MD  Ergocalciferol (VITAMIN D2) 50 MCG (2000 UT) TABS tAKE 1 Tablet daily 08/18/18   Bari Theodoro FALCON, MD  famotidine  (PEPCID ) 20 MG tablet Take 20 mg by mouth 2 (two) times daily. 07/15/23   [provider]  furosemide  (LASIX ) 40 MG tablet TAKE 1 TABLET BY MOUTH TWICE  DAILY AS NEEDED FOR EDEMA 10/29/23   Duanne Butler DASEN, MD  gabapentin  (NEURONTIN ) 300 MG capsule take 1 capsule (300 MILLIGRAM total) by mouth 3 (three) times daily as needed (nerve). 05/04/24   Duanne Butler DASEN, MD  levothyroxine  (SYNTHROID ) 125 MCG tablet Take 1 tablet (125 mcg total) by mouth daily. 12/18/23   Duanne Butler DASEN, MD  lidocaine  (XYLOCAINE ) 5 % ointment APPLY TOPICALLY 4 TIMES  DAILY AS NEEDED 12/19/22   Duanne Butler DASEN, MD  linaclotide  (LINZESS ) 145 MCG CAPS capsule take 1 capsule daily before breakfast. 11/20/23   Duanne Butler DASEN, MD  losartan  (COZAAR ) 50 MG tablet Take 1 tablet (50 mg total) by mouth daily. 07/01/23   Duanne Butler DASEN, MD  magnesium  oxide (MAG-OX) 400 MG tablet Take by mouth. 04/28/17   [provider]  NON FORMULARY Diltiazem 2%/Lidocaine5% compound Use 3 x rectally daily for 6 weeks to heal anal fissure 09/16/23   Mollie Nestor HERO,  PA-C  oxyCODONE  (OXY IR/ROXICODONE ) 5 MG immediate release tablet Take 1 tablet (5 mg total) by mouth every 6 (six) hours as needed for severe pain (pain score 7-10). 05/07/24   Duanne Butler DASEN, MD  potassium chloride  SA (KLOR-CON  M) 20 MEQ tablet TAKE 1 TABLET BY MOUTH DAILY 10/29/23   Duanne Butler DASEN, MD  pravastatin  (PRAVACHOL ) 20 MG tablet take 1 tablet (20 MILLIGRAM total) by mouth daily. 04/05/24   Duanne Butler DASEN, MD  traZODone  (DESYREL ) 50 MG tablet take 1 tablet (50 milligram total) by mouth at bedtime. 10/22/23   Duanne Butler DASEN, MD    Allergies: Codeine, Sulfa antibiotics, Bactrim [sulfamethoxazole-trimethoprim], Lactose, Nitrofuran derivatives, and  Penicillins    Review of Systems  HENT:  Positive for dental problem.   Gastrointestinal:  Positive for diarrhea. Negative for abdominal pain.  Musculoskeletal:  Positive for neck pain.  All other systems reviewed and are negative.   Updated Vital Signs BP (!) 142/102   Pulse 76   Temp 98.6 F (37 C) (Oral)   Resp 20   Ht 5' 1 (1.549 m)   Wt 92.5 kg   SpO2 94%   BMI 38.55 kg/m   Physical Exam Vitals and nursing note reviewed.  Constitutional:      Appearance: She is well-developed.  HENT:     Head: Normocephalic and atraumatic.     Mouth/Throat:     Mouth: Mucous membranes are moist.  Eyes:     Extraocular Movements: Extraocular movements intact.  Cardiovascular:     Rate and Rhythm: Normal rate.  Pulmonary:     Effort: Pulmonary effort is normal.  Abdominal:     General: Bowel sounds are normal. There is no distension.     Palpations: Abdomen is soft.     Tenderness: There is no abdominal tenderness.  Musculoskeletal:        General: Normal range of motion.     Cervical back: Normal range of motion.  Skin:    General: Skin is warm.  Neurological:     General: No focal deficit present.     Mental Status: She is alert and oriented to person, place, and time.  Psychiatric:        Mood and Affect: Mood normal.   Patient has anterior neck tenderness, no palpable mass, no sign of Ludwick's.  (all labs ordered are listed, but only abnormal results are displayed) Labs Reviewed  C DIFFICILE QUICK SCREEN W PCR REFLEX   - Abnormal; Notable for the following components:      Result Value   C Diff antigen POSITIVE (*)    All other components within normal limits  COMPREHENSIVE METABOLIC PANEL WITH GFR - Abnormal; Notable for the following components:   Glucose, Bld 147 (*)    Creatinine, Ser 1.25 (*)    AST 12 (*)    GFR, Estimated 46 (*)    All other components within normal limits  CBC - Abnormal; Notable for the following components:   Hemoglobin 11.6 (*)     HCT 35.5 (*)    MCV 77.5 (*)    MCH 25.3 (*)    All other components within normal limits  URINALYSIS, ROUTINE W REFLEX MICROSCOPIC - Abnormal; Notable for the following components:   Hgb urine dipstick SMALL (*)    Protein, ur 30 (*)    Nitrite POSITIVE (*)    Leukocytes,Ua MODERATE (*)    Bacteria, UA MANY (*)    All other components within normal limits  CLOSTRIDIUM DIFFICILE BY PCR, REFLEXED  GASTROINTESTINAL PANEL BY PCR, STOOL (REPLACES STOOL CULTURE)  LIPASE, BLOOD    EKG: None  Radiology: CT Soft Tissue Neck W Contrast Result Date: 05/16/2024 EXAM: CT NECK WITH CONTRAST 05/16/2024 03:44:32 PM TECHNIQUE: CT of the neck was performed with the administration of 75 mL of iohexol  (OMNIPAQUE ) 300 MG/ML solution. Multiplanar reformatted images are provided for review. Automated exposure control, iterative reconstruction, and/or weight based adjustment of the mA/kV was utilized to reduce the radiation dose to as low as reasonably achievable. COMPARISON: None available. CLINICAL HISTORY: Soft tissue infection suspected, neck, xray done. Neck pain. FINDINGS: AERODIGESTIVE TRACT: No discrete mass. No edema. SALIVARY GLANDS: The parotid and submandibular glands are unremarkable. THYROID : Unremarkable. LYMPH NODES: No suspicious cervical lymphadenopathy. SOFT TISSUES: No mass or fluid collection. BRAIN, ORBITS, SINUSES AND MASTOIDS: No acute abnormality. LUNGS AND MEDIASTINUM: No acute abnormality. BONES: No focal bone abnormality. IMPRESSION: 1. No acute findings or discrete mass in the neck. Electronically signed by: Lonni Necessary MD 05/16/2024 04:18 PM EST RP Workstation: [id number]     Procedures   Medications Ordered in the ED  sodium chloride  0.9 % bolus 500 mL (0 mLs Intravenous Stopped 05/16/24 1542)  loperamide (IMODIUM) capsule 4 mg (4 mg Oral Given 05/16/24 1513)  ondansetron  (ZOFRAN ) injection 4 mg (4 mg Intravenous Given 05/16/24 1513)  iohexol  (OMNIPAQUE ) 300 MG/ML  solution 75 mL (75 mLs Intravenous Contrast Given 05/16/24 1535)  morphine  (PF) 2 MG/ML injection 2 mg (2 mg Intravenous Given 05/16/24 1752)  morphine  (PF) 2 MG/ML injection 2 mg (2 mg Intravenous Given 05/16/24 1905)  oxyCODONE  (Oxy IR/ROXICODONE ) immediate release tablet 5 mg (5 mg Oral Given 05/16/24 2008)                                    Medical Decision Making Patient complains of diarrhea and pain in her neck.  Patient reports that she had a toothache and was taking amoxicillin.  Patient states that she restarted the amoxicillin.  Patient reports having several episodes of diarrhea today.  Patient denies any nausea or vomiting.  Amount and/or Complexity of Data Reviewed Independent Historian:     Details: Patient is here with her daughter who is supportive.  Patient's daughter reports patient has had several episodes of diarrhea Labs: ordered.    Details: Labs ordered reviewed and interpreted.  C diff antigen is positive, toxin is negative, reflex testing is negative.  White blood cell count is normal hemoglobin is 11.6.   Radiology: ordered.    Details: CT neck shows no neck mass.  Risk Prescription drug management. Risk Details: UA is obtained but is from ostomy.  Doubt patient has a urinary tract infection.  Patient is given Zofran  and Imodium.  Patient given IV fluids and 2 mg of morphine .  She reports her neck is feeling better.  Patient reports she is not currently having any abdominal pain.  Patient reports that she continues to have a headache.  Patient has not taken her home pain medicine.  Patient is given a dosage of her home pain medicines.  Patient is able to eat and drink she is not having any further diarrhea.  Patient does not have a neck abscess.  Patient does complain of can still having a headache.  Patient is offered CT evaluation of her head but she feels like she can go home.  Patient did express concern to the  nurse about her blood pressure.  Blood pressure is  currently 142/102.  I advised her to follow-up with her primary care doctor for recheck.  Her primary care doctor had stopped her blood pressure and I do not feel like I would reinitiate without further rechecks.        Final diagnoses:  Diarrhea, unspecified type  Neck pain    ED Discharge Orders     None       An After Visit Summary was printed and given to the patient.    [place] Sonny POUR, PA-C 05/17/24 STANLY Suzette Pac, MD 05/18/24 1053

## 2024-05-16 NOTE — ED Triage Notes (Signed)
 Pt c/o neck pain since Thurs with no injury noted. Pt also concerned about her abd pain with slimy feces since Fri.

## 2024-05-16 NOTE — ED Notes (Signed)
 PO Challenge complete. Pt able to hold down water  w/o nausea or vomitting.

## 2024-05-17 LAB — GASTROINTESTINAL PANEL BY PCR, STOOL (REPLACES STOOL CULTURE)

## 2024-05-19 ENCOUNTER — Ambulatory Visit
Admission: RE | Admit: 2024-05-19 | Discharge: 2024-05-19 | Disposition: A | Discharge status: Home / Self Care | Source: Ambulatory Visit | Attending: Family Medicine | Admitting: Family Medicine

## 2024-05-19 DIAGNOSIS — I808 Phlebitis and thrombophlebitis of other sites: Secondary | ICD-10-CM

## 2024-05-21 ENCOUNTER — Ambulatory Visit: Payer: Self-pay | Admitting: Family Medicine

## 2024-05-25 ENCOUNTER — Other Ambulatory Visit: Payer: Self-pay | Admitting: Family Medicine

## 2024-05-26 NOTE — Telephone Encounter (Signed)
 Requested Prescriptions  Pending Prescriptions Disp Refills   traZODone  (DESYREL ) 50 MG tablet [Pharmacy Med Name: TRAZODONE  50 MG TABLET] 90 tablet 0    Sig: take 1 tablet (50 milligram total) by mouth at bedtime.     Psychiatry: Antidepressants - Serotonin Modulator Passed - 05/26/2024 11:11 AM      Passed - Valid encounter within last 6 months    Recent Outpatient Visits           2 weeks ago Thrombophlebitis of right arm   [place] [facility] Pickard, Butler DASEN, MD   3 months ago Weakness   [place] [facility] Duanne, Butler DASEN, MD   3 months ago Bilateral lower extremity edema   [place] [facility] Kayla Jeoffrey RAMAN, FNP   5 months ago Idiopathic peripheral neuropathy   [place] [facility] Duanne, Butler DASEN, MD   9 months ago Influenza A   [place] [facility] Pickard, Butler DASEN, MD

## 2024-06-01 ENCOUNTER — Telehealth: Payer: Self-pay | Admitting: Family Medicine

## 2024-06-01 NOTE — Telephone Encounter (Signed)
 Copied from CRM #[id number]. Topic: Clinical - Medication Refill >> Jun 01, 2024 12:37 PM Antony S wrote: Medication: oxyCODONE  (OXY IR/ROXICODONE ) 5 MG immediate release tablet  Has the patient contacted their pharmacy? Yes (Agent: If no, request that the patient contact the pharmacy for the refill. If patient does not wish to contact the pharmacy document the reason why and proceed with request.) (Agent: If yes, when and what did the pharmacy advise?)  This is the patient's preferred pharmacy:  [facility] - [place], [place] - [address] [place] [address] Phone: [phone] Fax: [phone]   Is this the correct pharmacy for this prescription? Yes If no, delete pharmacy and type the correct one.   Has the prescription been filled recently? No  Is the patient out of the medication? No  Has the patient been seen for an appointment in the last year OR does the patient have an upcoming appointment? Yes  Can we respond through MyChart? Yes  Agent: Please be advised that Rx refills may take up to 3 business days. We ask that you follow-up with your pharmacy.

## 2024-06-02 ENCOUNTER — Other Ambulatory Visit: Payer: Self-pay | Admitting: Family Medicine

## 2024-06-04 NOTE — Telephone Encounter (Signed)
 Requested medications are due for refill today.  yes  Requested medications are on the active medications list.  yes  Last refill. 05/07/2024 #120 0 rf  Future visit scheduled.   no  Notes to clinic.  Refill not delegated    Requested Prescriptions  Pending Prescriptions Disp Refills   oxyCODONE  (OXY IR/ROXICODONE ) 5 MG immediate release tablet [Pharmacy Med Name: OXYCODONE  HCL (IR) 5 MG TABLET] 120 tablet 0    Sig: take 1 tablet (5 MILLIGRAM total) by mouth every 6 (six) hours as needed for severe pain (pain score 7-10).     Not Delegated - Analgesics:  Opioid Agonists Failed - 06/04/2024  3:08 PM      Failed - This refill cannot be delegated      Failed - Urine Drug Screen completed in last 360 days      Passed - Valid encounter within last 3 months    Recent Outpatient Visits           3 weeks ago Thrombophlebitis of right arm   [place] [facility] Duanne, Butler DASEN, MD   3 months ago Weakness   [place] [facility] Duanne Butler DASEN, MD   4 months ago Bilateral lower extremity edema   [place] [facility] Kayla Jeoffrey RAMAN, FNP   5 months ago Idiopathic peripheral neuropathy   [place] [facility] Duanne, Butler DASEN, MD   9 months ago Influenza A    [facility] Pickard, Butler DASEN, MD

## 2024-06-04 NOTE — Telephone Encounter (Signed)
 Requested medication (s) are due for refill today: yes  Requested medication (s) are on the active medication list: yes  Last refill:  05/07/24  Future visit scheduled: no  Notes to clinic:  Unable to refill per protocol, cannot delegate.      Requested Prescriptions  Pending Prescriptions Disp Refills   oxyCODONE  (OXY IR/ROXICODONE ) 5 MG immediate release tablet 120 tablet 0    Sig: Take 1 tablet (5 mg total) by mouth every 6 (six) hours as needed for severe pain (pain score 7-10).     Not Delegated - Analgesics:  Opioid Agonists Failed - 06/04/2024 12:03 PM      Failed - This refill cannot be delegated      Failed - Urine Drug Screen completed in last 360 days      Passed - Valid encounter within last 3 months    Recent Outpatient Visits           3 weeks ago Thrombophlebitis of right arm   [place] [facility] Duanne, Butler DASEN, MD   3 months ago Weakness   [place] [facility] Duanne Butler DASEN, MD   4 months ago Bilateral lower extremity edema   [place] [facility] Kayla Jeoffrey RAMAN, FNP   5 months ago Idiopathic peripheral neuropathy   [place] [facility] Duanne, Butler DASEN, MD   9 months ago Influenza A   [place] [facility] Pickard, Butler DASEN, MD

## 2024-06-28 ENCOUNTER — Other Ambulatory Visit: Payer: Self-pay | Admitting: Family Medicine

## 2024-06-28 DIAGNOSIS — E782 Mixed hyperlipidemia: Secondary | ICD-10-CM

## 2024-07-05 ENCOUNTER — Other Ambulatory Visit: Payer: Self-pay | Admitting: Family Medicine

## 2024-07-06 ENCOUNTER — Other Ambulatory Visit: Payer: Self-pay | Admitting: Family Medicine

## 2024-07-07 ENCOUNTER — Other Ambulatory Visit: Payer: Self-pay | Admitting: Family Medicine

## 2024-07-07 NOTE — Telephone Encounter (Signed)
 Requested medication (s) are due for refill today: No  Requested medication (s) are on the active medication list: Yes  Last refill:  07/06/24  Future visit scheduled: No  Notes to clinic:  Not delegated.    Requested Prescriptions  Pending Prescriptions Disp Refills   oxyCODONE  (OXY IR/ROXICODONE ) 5 MG immediate release tablet [Pharmacy Med Name: OXYCODONE  HCL (IR) 5 MG TABLET] 120 tablet 0    Sig: take 1 tablet (5 MILLIGRAM total) by mouth every 6 (six) hours as needed for severe pain (pain score 7-10).     Not Delegated - Analgesics:  Opioid Agonists Failed - 07/07/2024  2:58 PM      Failed - This refill cannot be delegated      Failed - Urine Drug Screen completed in last 360 days      Passed - Valid encounter within last 3 months    Recent Outpatient Visits           1 month ago Thrombophlebitis of right arm   [place] [facility] Duanne, Butler DASEN, MD   4 months ago Weakness   [place] [facility] Duanne Butler DASEN, MD   5 months ago Bilateral lower extremity edema   [place] [facility] Kayla Jeoffrey RAMAN, FNP   6 months ago Idiopathic peripheral neuropathy   [place] [facility] Duanne, Butler DASEN, MD   10 months ago Influenza A   [place] [facility] Pickard, Butler DASEN, MD

## 2024-07-07 NOTE — Telephone Encounter (Signed)
 Copied from CRM #[id number]. Topic: Clinical - Medication Refill >> Jul 07, 2024 10:45 AM Tiffini S wrote: Medication:  oxyCODONE  (OXY IR/ROXICODONE ) 5 MG immediate release tablet  Has the patient contacted their pharmacy? No (Agent: If no, request that the patient contact the pharmacy for the refill. If patient does not wish to contact the pharmacy document the reason why and proceed with request.) (Agent: If yes, when and what did the pharmacy advise?)  This is the patient's preferred pharmacy:  [facility] - [place], [place] - [address] [place] [address] Phone: [phone] Fax: [phone]   Is this the correct pharmacy for this prescription? Yes If no, delete pharmacy and type the correct one.   Has the prescription been filled recently? Yes  Is the patient out of the medication? Yes  Has the patient been seen for an appointment in the last year OR does the patient have an upcoming appointment? Yes  Can we respond through MyChart? No, please call [phone]  Agent: Please be advised that Rx refills may take up to 3 business days. We ask that you follow-up with your pharmacy.

## 2024-07-08 NOTE — Telephone Encounter (Signed)
 Requested medications are due for refill today.  no  Requested medications are on the active medications list.  yes  Last refill. 07/07/2023  Future visit scheduled.   no  Notes to clinic.  Refusal not delegated.    Requested Prescriptions  Pending Prescriptions Disp Refills   oxyCODONE  (OXY IR/ROXICODONE ) 5 MG immediate release tablet 120 tablet 0    Sig: Take 1 tablet (5 mg total) by mouth every 6 (six) hours as needed for severe pain (pain score 7-10).     Not Delegated - Analgesics:  Opioid Agonists Failed - 07/08/2024  1:11 PM      Failed - This refill cannot be delegated      Failed - Urine Drug Screen completed in last 360 days      Passed - Valid encounter within last 3 months    Recent Outpatient Visits           1 month ago Thrombophlebitis of right arm   [place] [facility] Duanne, Butler DASEN, MD   4 months ago Weakness   [place] [facility] Duanne Butler DASEN, MD   5 months ago Bilateral lower extremity edema   [place] [facility] Kayla Jeoffrey RAMAN, FNP   6 months ago Idiopathic peripheral neuropathy   [place] [facility] Duanne, Butler DASEN, MD   10 months ago Influenza A   [place] [facility] Pickard, Butler DASEN, MD

## 2024-07-11 ENCOUNTER — Other Ambulatory Visit: Payer: Self-pay | Admitting: Family Medicine

## 2024-07-22 ENCOUNTER — Ambulatory Visit: Payer: Self-pay

## 2024-07-22 NOTE — Telephone Encounter (Signed)
 FYI Only or Action Required?: FYI only for provider: appointment scheduled on 07/23/24.  Patient was last seen in primary care on 05/11/2024 by Duanne Butler DASEN, MD.  Called Nurse Triage reporting Dizziness.  Symptoms began several days ago.  Interventions attempted: Prescription medications: Lasix  and Rest, hydration, or home remedies.  Symptoms are: unchanged.  Triage Disposition: See Physician Within 24 Hours  Patient/caregiver understands and will follow disposition?: Yes     Message from Delon HERO sent at 07/22/2024  1:53 PM EST  Summary: Dizziness for 3 days.   Reason or Triage: Patient is calling to report dizziness for 3 days. No other symptoms. And no prior dizziness.         Reason for Disposition  Taking a medicine that could cause dizziness (e.g., blood pressure medications, diuretics)  Answer Assessment - Initial Assessment Questions 1. DESCRIPTION: Describe your dizziness.     Woozy, faint.  2. LIGHTHEADED: Do you feel lightheaded? (e.g., somewhat faint, woozy, weak upon standing)     Yes.  3. VERTIGO: Do you feel like either you or the room is spinning or tilting? (i.e., vertigo)     No.  4. SEVERITY: How bad is it?  Do you feel like you are going to faint? Can you stand and walk?     Able to stand and walk.  5. ONSET:  When did the dizziness begin?     Monday.  6. AGGRAVATING FACTORS: Does anything make it worse? (e.g., standing, change in head position)     Doing a lot of moving  7. HEART RATE: Can you tell me your heart rate? How many beats in 15 seconds?  (Note: Not all patients can do this.)       N/A.  8. CAUSE: What do you think is causing the dizziness? (e.g., decreased fluids or food, diarrhea, emotional distress, heat exposure, new medicine, sudden standing, vomiting; unknown)     Unsure.  9. RECURRENT SYMPTOM: Have you had dizziness before? If Yes, ask: When was the last time? What happened that time?      No.  10. OTHER SYMPTOMS: Do you have any other symptoms? (e.g., fever, chest pain, vomiting, diarrhea, bleeding)       Fatigue, blurry vision comes and go with the dizziness since yesterday, bilateral lower leg swelling since last week. No difficulty breathing, chest pain, unilateral numbness or weakness, changes in speech, double or loss of vision, fainting, palpitations, dehydration, fever, calve pain or redness.  Protocols used: Dizziness - Lightheadedness-A-AH

## 2024-07-23 ENCOUNTER — Ambulatory Visit: Admitting: Family Medicine

## 2024-07-23 VITALS — BP 123/79 | HR 81 | Temp 97.5°F | Ht 61.0 in | Wt 201.8 lb

## 2024-07-23 DIAGNOSIS — R42 Dizziness and giddiness: Secondary | ICD-10-CM | POA: Diagnosis not present

## 2024-07-23 DIAGNOSIS — R82998 Other abnormal findings in urine: Secondary | ICD-10-CM

## 2024-07-23 LAB — URINALYSIS
Bilirubin, UA: NEGATIVE
Glucose, UA: NEGATIVE
Ketones, UA: NEGATIVE
Nitrite, UA: NEGATIVE
Protein,UA: NEGATIVE
Specific Gravity, UA: 1.015 (ref 1.005–1.030)
Urobilinogen, Ur: 0.2 mg/dL (ref 0.2–1.0)
pH, UA: 5.5 (ref 5.0–7.5)

## 2024-07-23 LAB — BAYER DCA HB A1C WAIVED: HB A1C (BAYER DCA - WAIVED): 6.2 % — ABNORMAL HIGH (ref 4.8–5.6)

## 2024-07-23 MED ORDER — CEPHALEXIN 500 MG PO CAPS: 500.0000 mg | ORAL_CAPSULE | Freq: Two times a day (BID) | ORAL | 0 refills | Status: AC

## 2024-07-23 NOTE — Progress Notes (Signed)
 "  Acute Office Visit  Patient ID: [name], female    DOB: 1950/08/16, 74 y.o.   MRN: [id number]  PCP: Duanne Butler DASEN, MD  Chief Complaint  Patient presents with   Dizziness    Patient reports having dizzy spells for the past 4 days.     Subjective:     Dizziness    Discussed the use of AI scribe software for clinical note transcription with the patient, who gave verbal consent to proceed.  History of Present Illness   [name] is a 74 year old with a history of dizziness x 4 days.  Denies previous history of dizziness.  Dizziness and associated symptoms - Intermittent dizziness for the past four days, occurring sporadically and usually associated with movement - Single episode of headache during this period.  Headache has since resolved. - No chest pain, palpitations, fever, nasal congestion, runny nose, vomiting, or diarrhea  Visual disturbance - Received new glasses recently and does not like them so she has been switching between her new glasses and her old glasses. - New glasses possibly causing dizziness?  Peripheral edema - Leg swelling present which is baseline for the patient, actually less less severe than usual - Takes Lasix  nightly for leg swelling - Occasionally wears compression stockings  UTI - Patient reports that she has a history of UTIs and usually feels weak with them. - She states that she has never been dizzy with previous UTIs - Patient reports that she feels a generalized little weakness and wonders if this could be a UTI.  Medications - Has not recently started any new medications  History of thromboembolic disease - History of blood clots in the arms - Previously on blood thinners, discontinued approximately four months ago  History of bladder cancer - Bladder cancer diagnosed in 2012        Review of Systems  Neurological:  Positive for dizziness.       Objective:    BP 123/79   Pulse 81   Temp (!) 97.5  F (36.4 C)   Ht 5' 1 (1.549 m)   Wt 201 lb 12.8 oz (91.5 kg)   SpO2 98%   BMI 38.13 kg/m    Physical Exam Vitals reviewed.  Constitutional:      Appearance: Normal appearance.  HENT:     Head: Normocephalic and atraumatic.     Right Ear: Tympanic membrane, ear canal and external ear normal.     Left Ear: Tympanic membrane, ear canal and external ear normal.     Ears:     Comments: Moderate amount of cerumen bilat canals Eyes:     Extraocular Movements: Extraocular movements intact.     Conjunctiva/sclera: Conjunctivae normal.     Pupils: Pupils are equal, round, and reactive to light.  Cardiovascular:     Rate and Rhythm: Normal rate and regular rhythm.     Pulses: Normal pulses.     Heart sounds: Normal heart sounds. No murmur heard. Pulmonary:     Effort: Pulmonary effort is normal. No respiratory distress.     Breath sounds: Normal breath sounds.  Musculoskeletal:        General: No deformity. Normal range of motion.     Cervical back: Normal range of motion and neck supple.     Comments: +1 edema BLE. No erythema or increased warmth  Skin:    General: Skin is warm and dry.  Neurological:     General: No focal deficit  present.     Mental Status: She is alert and oriented to person, place, and time.  Psychiatric:        Mood and Affect: Mood normal.        Behavior: Behavior normal.       Results for orders placed or performed in visit on 07/23/24  Urinalysis  Result Value Ref Range   Specific Gravity, UA 1.015 1.005 - 1.030   pH, UA 5.5 5.0 - 7.5   Color, UA Yellow Yellow   Appearance Ur Clear Clear   Leukocytes,UA 1+ (A) Negative   Protein,UA Negative Negative/Trace   Glucose, UA Negative Negative   Ketones, UA Negative Negative   RBC, UA Trace (A) Negative   Bilirubin, UA Negative Negative   Urobilinogen, Ur 0.2 0.2 - 1.0 mg/dL   Nitrite, UA Negative Negative  Bayer DCA Hb A1c Waived  Result Value Ref Range   HB A1C (BAYER DCA - WAIVED) 6.2 (H)  4.8 - 5.6 %       Assessment & Plan:   Problem List Items Addressed This Visit   None Visit Diagnoses       Dizziness    -  Primary   Relevant Orders   Urinalysis (Completed)   Urine Culture   TSH   T4, Free   Comprehensive metabolic panel with GFR   CBC with Differential   Bayer DCA Hb A1c Waived (Completed)   EKG 12-Lead (Completed)     Urine leukocytes       Relevant Medications   cephALEXin  (KEFLEX ) 500 MG capsule       Assessment and Plan    Dizziness - Orthostatics normal - EKG showed NSR - Ordered A1c, CBC, CMP, T4, TSH. - Advised discontinuing new glasses for one week to see if symptoms resolve. - Discussed red flags that would warrant seeking immediate medical attention via 911. - Follow up if symptoms acutely worsen, no improvement over the next 2-3 days, fever develops, or for any other concerns  Lower extremity edema Chronic edema managed with Lasix . Compression stockings discussed for venous return. - Continue Lasix  as prescribed  UA - Positive for trace intact blood and +1 leuks.  Negative for nitrites.  - Will empirically treat for possible UTI at this time. - Patient reports that Keflex  has worked well for her in the past.  - Keflex  twice daily x 7 days ordered - Urine culture results pending      Meds ordered this encounter  Medications   cephALEXin  (KEFLEX ) 500 MG capsule    Sig: Take 1 capsule (500 mg total) by mouth 2 (two) times daily for 7 days.    Dispense:  14 capsule    Refill:  0    Supervising Provider:   JOLINDA NORENE HERO [[id number]]    No follow-ups on file.  Oneil LELON Severin, FNP [facility]   "

## 2024-07-24 LAB — COMPREHENSIVE METABOLIC PANEL WITH GFR
ALT: 9 [IU]/L (ref 0–32)
AST: 10 [IU]/L (ref 0–40)
Albumin: 4.2 g/dL (ref 3.8–4.8)
Alkaline Phosphatase: 103 [IU]/L (ref 49–135)
BUN/Creatinine Ratio: 13 (ref 12–28)
BUN: 16 mg/dL (ref 8–27)
Bilirubin Total: 0.5 mg/dL (ref 0.0–1.2)
CO2: 22 mmol/L (ref 20–29)
Calcium: 10 mg/dL (ref 8.7–10.3)
Chloride: 106 mmol/L (ref 96–106)
Creatinine, Ser: 1.25 mg/dL — ABNORMAL HIGH (ref 0.57–1.00)
Globulin, Total: 3 g/dL (ref 1.5–4.5)
Glucose: 85 mg/dL (ref 70–99)
Potassium: 4.3 mmol/L (ref 3.5–5.2)
Sodium: 143 mmol/L (ref 134–144)
Total Protein: 7.2 g/dL (ref 6.0–8.5)
eGFR: 46 mL/min/{1.73_m2} — ABNORMAL LOW

## 2024-07-24 LAB — CBC WITH DIFFERENTIAL/PLATELET
Basophils Absolute: 0 10*3/uL (ref 0.0–0.2)
Basos: 0 %
EOS (ABSOLUTE): 0.1 10*3/uL (ref 0.0–0.4)
Eos: 2 %
Hematocrit: 39.7 % (ref 34.0–46.6)
Hemoglobin: 13.2 g/dL (ref 11.1–15.9)
Immature Grans (Abs): 0 10*3/uL (ref 0.0–0.1)
Immature Granulocytes: 0 %
Lymphocytes Absolute: 2.1 10*3/uL (ref 0.7–3.1)
Lymphs: 22 %
MCH: 26.5 pg — ABNORMAL LOW (ref 26.6–33.0)
MCHC: 33.2 g/dL (ref 31.5–35.7)
MCV: 80 fL (ref 79–97)
Monocytes Absolute: 0.8 10*3/uL (ref 0.1–0.9)
Monocytes: 9 %
Neutrophils Absolute: 6.3 10*3/uL (ref 1.4–7.0)
Neutrophils: 67 %
Platelets: 398 10*3/uL (ref 150–450)
RBC: 4.99 x10E6/uL (ref 3.77–5.28)
RDW: 16.2 % — ABNORMAL HIGH (ref 11.7–15.4)
WBC: 9.5 10*3/uL (ref 3.4–10.8)

## 2024-07-24 LAB — TSH: TSH: 0.361 u[IU]/mL — ABNORMAL LOW (ref 0.450–4.500)

## 2024-07-24 LAB — T4, FREE: Free T4: 1.58 ng/dL (ref 0.82–1.77)

## 2024-07-27 ENCOUNTER — Ambulatory Visit: Payer: Self-pay | Admitting: Family Medicine

## 2024-07-30 LAB — URINE CULTURE

## 2024-08-05 ENCOUNTER — Other Ambulatory Visit: Payer: Self-pay | Admitting: Family Medicine

## 2024-08-05 ENCOUNTER — Ambulatory Visit: Payer: Self-pay

## 2024-08-05 NOTE — Telephone Encounter (Signed)
 FYI Only or Action Required?: FYI only for provider: appointment scheduled on 2/6.  Patient was last seen in primary care on 07/23/2024 by Alcus Oneil ORN, FNP.  Called Nurse Triage reporting Dizziness and Generalized Body Aches.  Symptoms began several weeks ago.  Interventions attempted: Rest, hydration, or home remedies.  Symptoms are: gradually worsening.  Triage Disposition: See Physician Within 24 Hours  Patient/caregiver understands and will follow disposition?: Yes  Message from [facility] D sent at 08/05/2024  1:35 PM EST  Pt is weak and having dizzy spells   Reason for Disposition  [1] MODERATE dizziness (e.g., interferes with normal activities) AND [2] has NOT been evaluated by doctor (or NP/PA) for this  (Exception: Dizziness caused by heat exposure, sudden standing, or poor fluid intake.)  Answer Assessment - Initial Assessment Questions Patient with dizzy spells starting about 2 weeks ago- light headed episodes where she has to sit down for a few minutes and then she is fine. Worse with bending over and going from sitting to standing. Endorses it happening while standing.  Denies CP, SOB, Fever. Generalized weakness/fatigue, denies unilateral weakness, vision changes, speech or balance changes.. Still able to complete ADLS. No trauma or bleeding.   Appt with PCP office 2/6 to assess. ED precautions understood   1. DESCRIPTION: Describe your dizziness.     Light headed  2. LIGHTHEADED: Do you feel lightheaded? (e.g., somewhat faint, woozy, weak upon standing)     Like she will faint  3. VERTIGO: Do you feel like either you or the room is spinning or tilting? (i.e., vertigo)     Denies  4. SEVERITY: How bad is it?  Do you feel like you are going to faint? Can you stand and walk?     4x day- Sits down and waits it out- feels like she will fall if still standing 5. ONSET:  When did the dizziness begin?      Little over 2 weeks  6. AGGRAVATING FACTORS: Does  anything make it worse? (e.g., standing, change in head position)     Bending over,  7. HEART RATE: Can you tell me your heart rate? How many beats in 15 seconds?  (Note: Not all patients can do this.)       Denies  8. CAUSE: What do you think is causing the dizziness? (e.g., decreased fluids or food, diarrhea, emotional distress, heat exposure, new medicine, sudden standing, vomiting; unknown)     Hydrated- no new meds,  9. RECURRENT SYMPTOM: Have you had dizziness before? If Yes, ask: When was the last time? What happened that time?     First time  10. OTHER SYMPTOMS: Do you have any other symptoms? (e.g., fever, chest pain, vomiting, diarrhea, bleeding)       Denies  Protocols used: Dizziness - Lightheadedness-A-AH

## 2024-08-05 NOTE — Telephone Encounter (Unsigned)
 Copied from CRM [id number]. Topic: Clinical - Medication Refill >> Aug 05, 2024  1:34 PM Gustabo D wrote: Medication: oxyCODONE  (OXY IR/ROXICODONE ) 5 MG immediate release tablet  Has the patient contacted their pharmacy? No (Agent: If no, request that the patient contact the pharmacy for the refill. If patient does not wish to contact the pharmacy document the reason why and proceed with request.) (Agent: If yes, when and what did the pharmacy advise?)  This is the patient's preferred pharmacy:  [facility] - [place], [place] - [address] [place] [address] Phone: [phone] Fax: [phone]     Is this the correct pharmacy for this prescription? Yes If no, delete pharmacy and type the correct one.   Has the prescription been filled recently? No  Is the patient out of the medication? no  Has the patient been seen for an appointment in the last year OR does the patient have an upcoming appointment? Yes  Can we respond through MyChart? No  Agent: Please be advised that Rx refills may take up to 3 business days. We ask that you follow-up with your pharmacy.

## 2024-08-06 ENCOUNTER — Ambulatory Visit: Admitting: Family Medicine

## 2024-08-06 ENCOUNTER — Encounter: Payer: Self-pay | Admitting: Family Medicine

## 2024-08-06 VITALS — Ht 61.0 in | Wt 207.0 lb

## 2024-08-06 DIAGNOSIS — I10 Essential (primary) hypertension: Secondary | ICD-10-CM

## 2024-08-06 DIAGNOSIS — H6123 Impacted cerumen, bilateral: Secondary | ICD-10-CM

## 2024-08-06 DIAGNOSIS — R42 Dizziness and giddiness: Secondary | ICD-10-CM

## 2024-08-06 NOTE — Telephone Encounter (Signed)
 Requested medications are due for refill today.  yes  Requested medications are on the active medications list.  yes  Last refill. 07/07/2023 #120 0 rf  Future visit scheduled.   yes  Notes to clinic.  Refill not delegated.     Requested Prescriptions  Pending Prescriptions Disp Refills   oxyCODONE  (OXY IR/ROXICODONE ) 5 MG immediate release tablet 120 tablet 0    Sig: Take 1 tablet (5 mg total) by mouth every 6 (six) hours as needed for severe pain (pain score 7-10).     Not Delegated - Analgesics:  Opioid Agonists Failed - 08/06/2024  3:20 PM      Failed - This refill cannot be delegated      Failed - Urine Drug Screen completed in last 360 days      Passed - Valid encounter within last 3 months    Recent Outpatient Visits           2 months ago Thrombophlebitis of right arm   [place] [facility] Duanne, Butler DASEN, MD   5 months ago Weakness   [place] [facility] Duanne Butler DASEN, MD   6 months ago Bilateral lower extremity edema   [place] [facility] Kayla Jeoffrey RAMAN, FNP   7 months ago Idiopathic peripheral neuropathy   [place] [facility] Duanne, Butler DASEN, MD   11 months ago Influenza A   [place] [facility] Pickard, Butler DASEN, MD

## 2024-08-06 NOTE — Progress Notes (Unsigned)
 "  Patient Office Visit  Assessment & Plan:  Dizziness -     CT HEAD WO CONTRAST ( ); Future  Hypertension, unspecified type  Bilateral impacted cerumen -     Ear Lavage   Assessment and Plan               No follow-ups on file.   Subjective:    Patient ID: [name], female    DOB: June 13, 1951  Age: 74 y.o. MRN: [id number]  Chief Complaint  Patient presents with   Dizziness    X 2 weeks. Pt states that she has not passed out any.     Dizziness   Discussed the use of AI scribe software for clinical note transcription with the patient, who gave verbal consent to proceed.  History of Present Illness             The 10-year ASCVD risk score (Arnett DK, et al., 2019) is: 13%  Past Medical History:  Diagnosis Date   Acid reflux    Bowel obstruction (HCC) several   Recurrent SBO secondary to adhesions   Cancer [facility]) 2011 [facility])   diagnosed in 2009 per pt. Invasive High grade Urothelial carcinoma- s/p radiation and Chemo    Chronic back pain    Clotting disorder    DVT (deep venous thrombosis) (HCC) 2015   Fatty liver    Gout    Hyperlipidemia    Hypertension    pt says taken off medication since has lost weight.   Hypothyroidism    Internal hemorrhoids    Leukopenia 12/06/2011   HIV serology negative   Malnutrition    PE (pulmonary embolism)    PER [facility] records   Rectal ulcer 2016   [facility]   UTI (lower urinary tract infection)    Recurrent   Past Surgical History:  Procedure Laterality Date   ABDOMINAL SURGERY     with intestinal puncture x 2, exploratory surgery    BACK SURGERY     X2   BLADDER REMOVAL     CHOLECYSTECTOMY     CYSTECTOMY     breast   HERNIA REPAIR     mesh   ILEO CONDUIT     For bladder cancer   PORTACATH PLACEMENT     Social History[1] Family History  Problem Relation Age of Onset   Heart disease Mother        enlarged   Hypertension Mother    Stroke Mother    Diabetes Father     Colon polyps Sister    Diabetes Sister    Diabetes Maternal Grandmother    Colon cancer Neg Hx    Esophageal cancer Neg Hx    Rectal cancer Neg Hx    Stomach cancer Neg Hx    Allergies[2]  Review of Systems  Neurological:  Positive for dizziness.      Objective:    Ht 5' 1 (1.549 m)   Wt 207 lb (93.9 kg)   BMI 39.11 kg/m  BP Readings from Last 3 Encounters:  07/23/24 123/79  05/16/24 (!) 142/102  05/11/24 124/64   Wt Readings from Last 3 Encounters:  08/06/24 207 lb (93.9 kg)  07/23/24 201 lb 12.8 oz (91.5 kg)  05/16/24 204 lb (92.5 kg)    Physical Exam Vitals and nursing note reviewed.  Constitutional:      Appearance: Normal appearance.  HENT:     Head: Normocephalic.     Right Ear: Tympanic membrane, ear canal  and external ear normal.     Left Ear: Tympanic membrane, ear canal and external ear normal.  Eyes:     Extraocular Movements: Extraocular movements intact.     Conjunctiva/sclera: Conjunctivae normal.     Pupils: Pupils are equal, round, and reactive to light.  Cardiovascular:     Rate and Rhythm: Normal rate and regular rhythm.     Heart sounds: Normal heart sounds.  Pulmonary:     Effort: Pulmonary effort is normal.     Breath sounds: Normal breath sounds.  Musculoskeletal:     Right lower leg: No edema.     Left lower leg: No edema.  Neurological:     General: No focal deficit present.     Mental Status: She is alert and oriented to person, place, and time.  Psychiatric:        Mood and Affect: Mood normal.        Behavior: Behavior normal.        Thought Content: Thought content normal.        Judgment: Judgment normal.      No results found for any visits on 08/06/24.  {Labs (Optional):23779}        [1]  Social History Tobacco Use   Smoking status: Former    Current packs/day: 0.00    Average packs/day: 0.6 packs/day for 48.0 years (28.8 ttl pk-yrs)    Types: Cigarettes    Start date: 10/03/1968    Quit date: 10/03/2016     Years since quitting: 7.8   Smokeless tobacco: Never  Vaping Use   Vaping status: Never Used  Substance Use Topics   Alcohol use: No   Drug use: No  [2]  Allergies Allergen Reactions   Codeine Hives   Sulfa Antibiotics Hives   Bactrim [Sulfamethoxazole-Trimethoprim] Itching    Reports severe itching.   Lactose Diarrhea   Nitrofuran Derivatives Itching   Penicillins Rash    Has patient had a PCN reaction causing immediate rash, facial/tongue/throat swelling, SOB or lightheadedness with hypotension: No Has patient had a PCN reaction causing severe rash involving mucus membranes or skin necrosis: No Has patient had a PCN reaction that required hospitalization: No Has patient had a PCN reaction occurring within the last 10 years: No If all of the above answers are NO, then may proceed with Cephalosporin use.  Childhood rash - no adult reactions known   "
